# Patient Record
Sex: Female | Born: 1970 | Race: Black or African American | Hispanic: No | Marital: Married | Smoking: Never smoker
Health system: Southern US, Community
[De-identification: ages and names within clinical notes are randomized; demographics above are authoritative.]

## PROBLEM LIST (undated history)

## (undated) DIAGNOSIS — F329 Major depressive disorder, single episode, unspecified: Secondary | ICD-10-CM

## (undated) DIAGNOSIS — M1990 Unspecified osteoarthritis, unspecified site: Secondary | ICD-10-CM

## (undated) DIAGNOSIS — G8929 Other chronic pain: Secondary | ICD-10-CM

## (undated) DIAGNOSIS — K219 Gastro-esophageal reflux disease without esophagitis: Secondary | ICD-10-CM

## (undated) DIAGNOSIS — K648 Other hemorrhoids: Secondary | ICD-10-CM

## (undated) DIAGNOSIS — I10 Essential (primary) hypertension: Secondary | ICD-10-CM

## (undated) DIAGNOSIS — K449 Diaphragmatic hernia without obstruction or gangrene: Secondary | ICD-10-CM

## (undated) DIAGNOSIS — D214 Benign neoplasm of connective and other soft tissue of abdomen: Secondary | ICD-10-CM

## (undated) DIAGNOSIS — K224 Dyskinesia of esophagus: Secondary | ICD-10-CM

## (undated) DIAGNOSIS — F419 Anxiety disorder, unspecified: Secondary | ICD-10-CM

## (undated) DIAGNOSIS — Z9884 Bariatric surgery status: Secondary | ICD-10-CM

## (undated) DIAGNOSIS — I82409 Acute embolism and thrombosis of unspecified deep veins of unspecified lower extremity: Secondary | ICD-10-CM

## (undated) DIAGNOSIS — I2699 Other pulmonary embolism without acute cor pulmonale: Secondary | ICD-10-CM

## (undated) DIAGNOSIS — D649 Anemia, unspecified: Secondary | ICD-10-CM

## (undated) DIAGNOSIS — M25569 Pain in unspecified knee: Secondary | ICD-10-CM

## (undated) DIAGNOSIS — I209 Angina pectoris, unspecified: Secondary | ICD-10-CM

## (undated) DIAGNOSIS — Z87442 Personal history of urinary calculi: Secondary | ICD-10-CM

## (undated) DIAGNOSIS — F32A Depression, unspecified: Secondary | ICD-10-CM

## (undated) DIAGNOSIS — M549 Dorsalgia, unspecified: Secondary | ICD-10-CM

## (undated) HISTORY — DX: Diaphragmatic hernia without obstruction or gangrene: K44.9

## (undated) HISTORY — DX: Anxiety disorder, unspecified: F41.9

## (undated) HISTORY — DX: Bariatric surgery status: Z98.84

## (undated) HISTORY — DX: Depression, unspecified: F32.A

## (undated) HISTORY — DX: Morbid (severe) obesity due to excess calories: E66.01

## (undated) HISTORY — DX: Other hemorrhoids: K64.8

## (undated) HISTORY — DX: Essential (primary) hypertension: I10

## (undated) HISTORY — DX: Gastro-esophageal reflux disease without esophagitis: K21.9

## (undated) HISTORY — PX: ROUX-EN-Y GASTRIC BYPASS: SHX1104

## (undated) HISTORY — PX: ABDOMINAL HYSTERECTOMY: SHX81

## (undated) HISTORY — PX: KNEE ARTHROSCOPY: SUR90

## (undated) HISTORY — DX: Benign neoplasm of connective and other soft tissue of abdomen: D21.4

## (undated) HISTORY — PX: TUBAL LIGATION: SHX77

## (undated) HISTORY — DX: Dyskinesia of esophagus: K22.4

## (undated) HISTORY — PX: OTHER SURGICAL HISTORY: SHX169

## (undated) HISTORY — DX: Anemia, unspecified: D64.9

## (undated) HISTORY — DX: Major depressive disorder, single episode, unspecified: F32.9

---

## 1998-09-04 ENCOUNTER — Emergency Department (HOSPITAL_COMMUNITY): Admission: EM | Admit: 1998-09-04 | Discharge: 1998-09-05 | Payer: Self-pay | Admitting: Emergency Medicine

## 1998-10-08 ENCOUNTER — Emergency Department (HOSPITAL_COMMUNITY): Admission: EM | Admit: 1998-10-08 | Discharge: 1998-10-08 | Payer: Self-pay | Admitting: Emergency Medicine

## 1998-10-10 ENCOUNTER — Encounter: Admission: RE | Admit: 1998-10-10 | Discharge: 1998-11-06 | Payer: Self-pay | Admitting: *Deleted

## 1999-01-26 ENCOUNTER — Emergency Department (HOSPITAL_COMMUNITY): Admission: EM | Admit: 1999-01-26 | Discharge: 1999-01-26 | Payer: Self-pay | Admitting: Emergency Medicine

## 2000-01-19 ENCOUNTER — Emergency Department (HOSPITAL_COMMUNITY): Admission: EM | Admit: 2000-01-19 | Discharge: 2000-01-19 | Payer: Self-pay | Admitting: Emergency Medicine

## 2000-03-15 ENCOUNTER — Encounter: Payer: Self-pay | Admitting: Family Medicine

## 2000-03-15 ENCOUNTER — Ambulatory Visit (HOSPITAL_COMMUNITY): Admission: RE | Admit: 2000-03-15 | Discharge: 2000-03-15 | Payer: Self-pay | Admitting: Family Medicine

## 2000-04-25 ENCOUNTER — Encounter: Payer: Self-pay | Admitting: Emergency Medicine

## 2000-04-25 ENCOUNTER — Emergency Department (HOSPITAL_COMMUNITY): Admission: EM | Admit: 2000-04-25 | Discharge: 2000-04-25 | Payer: Self-pay | Admitting: Emergency Medicine

## 2000-05-19 ENCOUNTER — Encounter: Admission: RE | Admit: 2000-05-19 | Discharge: 2000-05-19 | Payer: Self-pay | Admitting: Internal Medicine

## 2001-01-12 ENCOUNTER — Emergency Department (HOSPITAL_COMMUNITY): Admission: EM | Admit: 2001-01-12 | Discharge: 2001-01-12 | Payer: Self-pay | Admitting: Emergency Medicine

## 2001-01-13 ENCOUNTER — Encounter: Payer: Self-pay | Admitting: Emergency Medicine

## 2001-01-13 ENCOUNTER — Ambulatory Visit (HOSPITAL_COMMUNITY): Admission: RE | Admit: 2001-01-13 | Discharge: 2001-01-13 | Payer: Self-pay | Admitting: Neurology

## 2001-01-13 ENCOUNTER — Encounter: Payer: Self-pay | Admitting: Neurology

## 2001-01-19 ENCOUNTER — Encounter: Payer: Self-pay | Admitting: Internal Medicine

## 2001-01-19 ENCOUNTER — Encounter: Admission: RE | Admit: 2001-01-19 | Discharge: 2001-01-19 | Payer: Self-pay | Admitting: Internal Medicine

## 2001-03-08 ENCOUNTER — Ambulatory Visit (HOSPITAL_COMMUNITY): Admission: RE | Admit: 2001-03-08 | Discharge: 2001-03-08 | Payer: Self-pay | Admitting: Neurosurgery

## 2001-03-08 ENCOUNTER — Encounter: Payer: Self-pay | Admitting: Neurosurgery

## 2001-08-15 ENCOUNTER — Encounter: Payer: Self-pay | Admitting: Internal Medicine

## 2001-08-15 ENCOUNTER — Encounter: Admission: RE | Admit: 2001-08-15 | Discharge: 2001-08-15 | Payer: Self-pay | Admitting: Internal Medicine

## 2001-09-12 ENCOUNTER — Encounter: Payer: Self-pay | Admitting: Neurosurgery

## 2001-09-12 ENCOUNTER — Ambulatory Visit (HOSPITAL_COMMUNITY): Admission: RE | Admit: 2001-09-12 | Discharge: 2001-09-12 | Payer: Self-pay | Admitting: Neurosurgery

## 2002-02-26 ENCOUNTER — Encounter: Payer: Self-pay | Admitting: Emergency Medicine

## 2002-02-26 ENCOUNTER — Emergency Department (HOSPITAL_COMMUNITY): Admission: EM | Admit: 2002-02-26 | Discharge: 2002-02-26 | Payer: Self-pay

## 2002-05-18 ENCOUNTER — Ambulatory Visit (HOSPITAL_COMMUNITY): Admission: RE | Admit: 2002-05-18 | Discharge: 2002-05-18 | Payer: Self-pay | Admitting: Gastroenterology

## 2002-05-18 ENCOUNTER — Encounter (INDEPENDENT_AMBULATORY_CARE_PROVIDER_SITE_OTHER): Payer: Self-pay | Admitting: *Deleted

## 2002-08-25 ENCOUNTER — Emergency Department (HOSPITAL_COMMUNITY): Admission: EM | Admit: 2002-08-25 | Discharge: 2002-08-25 | Payer: Self-pay | Admitting: Emergency Medicine

## 2002-08-25 ENCOUNTER — Encounter: Payer: Self-pay | Admitting: Emergency Medicine

## 2002-11-07 ENCOUNTER — Emergency Department (HOSPITAL_COMMUNITY): Admission: EM | Admit: 2002-11-07 | Discharge: 2002-11-07 | Payer: Self-pay | Admitting: Emergency Medicine

## 2002-11-07 ENCOUNTER — Encounter: Payer: Self-pay | Admitting: Emergency Medicine

## 2003-02-02 ENCOUNTER — Encounter: Payer: Self-pay | Admitting: Internal Medicine

## 2003-02-02 ENCOUNTER — Ambulatory Visit (HOSPITAL_COMMUNITY): Admission: RE | Admit: 2003-02-02 | Discharge: 2003-02-02 | Payer: Self-pay | Admitting: Internal Medicine

## 2003-07-26 ENCOUNTER — Encounter: Admission: RE | Admit: 2003-07-26 | Discharge: 2003-10-24 | Payer: Self-pay | Admitting: Orthopedic Surgery

## 2003-08-21 ENCOUNTER — Ambulatory Visit (HOSPITAL_COMMUNITY): Admission: RE | Admit: 2003-08-21 | Discharge: 2003-08-21 | Payer: Self-pay | Admitting: Obstetrics and Gynecology

## 2003-09-12 ENCOUNTER — Ambulatory Visit (HOSPITAL_COMMUNITY): Admission: RE | Admit: 2003-09-12 | Discharge: 2003-09-12 | Payer: Self-pay | Admitting: Orthopedic Surgery

## 2003-09-12 ENCOUNTER — Ambulatory Visit (HOSPITAL_BASED_OUTPATIENT_CLINIC_OR_DEPARTMENT_OTHER): Admission: RE | Admit: 2003-09-12 | Discharge: 2003-09-12 | Payer: Self-pay | Admitting: Orthopedic Surgery

## 2004-03-04 ENCOUNTER — Ambulatory Visit: Payer: Self-pay | Admitting: Nurse Practitioner

## 2004-03-18 ENCOUNTER — Ambulatory Visit (HOSPITAL_COMMUNITY): Admission: RE | Admit: 2004-03-18 | Discharge: 2004-03-18 | Payer: Self-pay | Admitting: Nurse Practitioner

## 2004-04-03 HISTORY — PX: TOTAL ABDOMINAL HYSTERECTOMY: SHX209

## 2004-04-08 ENCOUNTER — Ambulatory Visit: Payer: Self-pay | Admitting: Nurse Practitioner

## 2004-04-18 ENCOUNTER — Ambulatory Visit (HOSPITAL_COMMUNITY): Admission: RE | Admit: 2004-04-18 | Discharge: 2004-04-18 | Payer: Self-pay | Admitting: Nurse Practitioner

## 2004-04-23 ENCOUNTER — Emergency Department (HOSPITAL_COMMUNITY): Admission: EM | Admit: 2004-04-23 | Discharge: 2004-04-23 | Payer: Self-pay | Admitting: Family Medicine

## 2004-04-24 ENCOUNTER — Encounter (INDEPENDENT_AMBULATORY_CARE_PROVIDER_SITE_OTHER): Payer: Self-pay | Admitting: *Deleted

## 2004-04-24 ENCOUNTER — Inpatient Hospital Stay (HOSPITAL_COMMUNITY): Admission: RE | Admit: 2004-04-24 | Discharge: 2004-04-26 | Payer: Self-pay | Admitting: Obstetrics and Gynecology

## 2004-05-26 ENCOUNTER — Ambulatory Visit: Payer: Self-pay | Admitting: Nurse Practitioner

## 2004-06-19 ENCOUNTER — Ambulatory Visit: Payer: Self-pay | Admitting: Nurse Practitioner

## 2004-06-25 ENCOUNTER — Encounter: Admission: RE | Admit: 2004-06-25 | Discharge: 2004-07-29 | Payer: Self-pay | Admitting: Internal Medicine

## 2004-08-13 ENCOUNTER — Ambulatory Visit: Payer: Self-pay | Admitting: Nurse Practitioner

## 2004-09-01 ENCOUNTER — Encounter (INDEPENDENT_AMBULATORY_CARE_PROVIDER_SITE_OTHER): Payer: Self-pay | Admitting: *Deleted

## 2004-09-01 LAB — CONVERTED CEMR LAB

## 2004-09-10 ENCOUNTER — Encounter: Admission: RE | Admit: 2004-09-10 | Discharge: 2004-09-10 | Payer: Self-pay | Admitting: Family Medicine

## 2004-09-12 ENCOUNTER — Ambulatory Visit: Payer: Self-pay | Admitting: Family Medicine

## 2004-09-12 ENCOUNTER — Other Ambulatory Visit: Admission: RE | Admit: 2004-09-12 | Discharge: 2004-09-12 | Payer: Self-pay | Admitting: Family Medicine

## 2004-09-26 ENCOUNTER — Ambulatory Visit: Payer: Self-pay | Admitting: Family Medicine

## 2004-09-27 ENCOUNTER — Observation Stay (HOSPITAL_COMMUNITY): Admission: EM | Admit: 2004-09-27 | Discharge: 2004-09-27 | Payer: Self-pay | Admitting: *Deleted

## 2004-10-21 ENCOUNTER — Ambulatory Visit: Payer: Self-pay | Admitting: Family Medicine

## 2004-10-31 ENCOUNTER — Ambulatory Visit: Payer: Self-pay | Admitting: Family Medicine

## 2004-11-03 ENCOUNTER — Ambulatory Visit: Payer: Self-pay | Admitting: Family Medicine

## 2005-02-02 ENCOUNTER — Emergency Department (HOSPITAL_COMMUNITY): Admission: EM | Admit: 2005-02-02 | Discharge: 2005-02-02 | Payer: Self-pay | Admitting: Family Medicine

## 2005-03-23 ENCOUNTER — Ambulatory Visit: Payer: Self-pay | Admitting: Family Medicine

## 2005-07-14 ENCOUNTER — Ambulatory Visit: Payer: Self-pay | Admitting: Family Medicine

## 2005-07-22 ENCOUNTER — Emergency Department (HOSPITAL_COMMUNITY): Admission: EM | Admit: 2005-07-22 | Discharge: 2005-07-22 | Payer: Self-pay | Admitting: Emergency Medicine

## 2005-12-11 ENCOUNTER — Ambulatory Visit: Payer: Self-pay | Admitting: Family Medicine

## 2006-03-02 ENCOUNTER — Ambulatory Visit: Payer: Self-pay | Admitting: Family Medicine

## 2006-04-13 ENCOUNTER — Ambulatory Visit: Payer: Self-pay | Admitting: Sports Medicine

## 2006-05-11 ENCOUNTER — Ambulatory Visit: Payer: Self-pay | Admitting: Family Medicine

## 2006-05-13 ENCOUNTER — Ambulatory Visit: Payer: Self-pay | Admitting: Sports Medicine

## 2006-07-01 DIAGNOSIS — I10 Essential (primary) hypertension: Secondary | ICD-10-CM | POA: Insufficient documentation

## 2006-07-01 DIAGNOSIS — K219 Gastro-esophageal reflux disease without esophagitis: Secondary | ICD-10-CM | POA: Insufficient documentation

## 2006-07-01 DIAGNOSIS — J309 Allergic rhinitis, unspecified: Secondary | ICD-10-CM | POA: Insufficient documentation

## 2006-07-01 DIAGNOSIS — F339 Major depressive disorder, recurrent, unspecified: Secondary | ICD-10-CM | POA: Insufficient documentation

## 2006-07-02 ENCOUNTER — Encounter (INDEPENDENT_AMBULATORY_CARE_PROVIDER_SITE_OTHER): Payer: Self-pay | Admitting: *Deleted

## 2006-09-30 ENCOUNTER — Telehealth: Payer: Self-pay | Admitting: Family Medicine

## 2006-11-01 ENCOUNTER — Encounter: Payer: Self-pay | Admitting: Family Medicine

## 2006-11-01 ENCOUNTER — Ambulatory Visit: Payer: Self-pay | Admitting: Family Medicine

## 2007-01-11 ENCOUNTER — Ambulatory Visit: Payer: Self-pay | Admitting: Family Medicine

## 2007-01-11 ENCOUNTER — Telehealth (INDEPENDENT_AMBULATORY_CARE_PROVIDER_SITE_OTHER): Payer: Self-pay | Admitting: *Deleted

## 2007-01-13 ENCOUNTER — Telehealth: Payer: Self-pay | Admitting: *Deleted

## 2007-01-13 ENCOUNTER — Encounter: Payer: Self-pay | Admitting: Family Medicine

## 2007-03-08 ENCOUNTER — Ambulatory Visit: Payer: Self-pay | Admitting: Family Medicine

## 2007-03-25 ENCOUNTER — Encounter: Payer: Self-pay | Admitting: Family Medicine

## 2007-06-10 ENCOUNTER — Encounter: Payer: Self-pay | Admitting: Family Medicine

## 2007-07-05 ENCOUNTER — Encounter (INDEPENDENT_AMBULATORY_CARE_PROVIDER_SITE_OTHER): Payer: Self-pay | Admitting: *Deleted

## 2007-07-05 ENCOUNTER — Telehealth: Payer: Self-pay | Admitting: *Deleted

## 2007-07-05 ENCOUNTER — Ambulatory Visit: Payer: Self-pay | Admitting: Family Medicine

## 2007-07-05 ENCOUNTER — Telehealth (INDEPENDENT_AMBULATORY_CARE_PROVIDER_SITE_OTHER): Payer: Self-pay | Admitting: *Deleted

## 2007-07-05 DIAGNOSIS — IMO0002 Reserved for concepts with insufficient information to code with codable children: Secondary | ICD-10-CM | POA: Insufficient documentation

## 2007-07-05 DIAGNOSIS — M1710 Unilateral primary osteoarthritis, unspecified knee: Secondary | ICD-10-CM

## 2007-07-06 ENCOUNTER — Encounter: Payer: Self-pay | Admitting: Family Medicine

## 2007-07-06 ENCOUNTER — Encounter: Admission: RE | Admit: 2007-07-06 | Discharge: 2007-07-06 | Payer: Self-pay | Admitting: Family Medicine

## 2007-07-11 ENCOUNTER — Encounter: Payer: Self-pay | Admitting: Family Medicine

## 2007-09-09 ENCOUNTER — Ambulatory Visit: Payer: Self-pay | Admitting: Family Medicine

## 2007-09-12 ENCOUNTER — Telehealth: Payer: Self-pay | Admitting: *Deleted

## 2007-09-12 ENCOUNTER — Ambulatory Visit: Payer: Self-pay | Admitting: Family Medicine

## 2007-10-07 ENCOUNTER — Encounter: Payer: Self-pay | Admitting: Family Medicine

## 2007-10-07 ENCOUNTER — Ambulatory Visit: Payer: Self-pay | Admitting: Family Medicine

## 2007-10-07 LAB — CONVERTED CEMR LAB
ALT: 8 units/L (ref 0–35)
AST: 12 units/L (ref 0–37)
Albumin: 3.8 g/dL (ref 3.5–5.2)
Alkaline Phosphatase: 55 units/L (ref 39–117)
BUN: 10 mg/dL (ref 6–23)
CO2: 23 meq/L (ref 19–32)
Calcium: 9.4 mg/dL (ref 8.4–10.5)
Chloride: 104 meq/L (ref 96–112)
Cholesterol: 153 mg/dL (ref 0–200)
Creatinine, Ser: 0.7 mg/dL (ref 0.40–1.20)
Glucose, Bld: 78 mg/dL (ref 70–99)
HDL: 48 mg/dL (ref >39)
LDL Cholesterol: 92 mg/dL (ref 0–99)
Potassium: 4.5 meq/L (ref 3.5–5.3)
Sodium: 139 meq/L (ref 135–145)
Total Bilirubin: 0.4 mg/dL (ref 0.3–1.2)
Total CHOL/HDL Ratio: 3.2
Total Protein: 7.5 g/dL (ref 6.0–8.3)
Triglycerides: 67 mg/dL (ref <150)
VLDL: 13 mg/dL (ref 0–40)

## 2007-10-08 ENCOUNTER — Encounter: Admission: RE | Admit: 2007-10-08 | Discharge: 2007-10-08 | Payer: Self-pay | Admitting: Family Medicine

## 2007-10-25 ENCOUNTER — Encounter: Payer: Self-pay | Admitting: Family Medicine

## 2008-01-11 ENCOUNTER — Encounter (INDEPENDENT_AMBULATORY_CARE_PROVIDER_SITE_OTHER): Payer: Self-pay | Admitting: *Deleted

## 2008-02-06 ENCOUNTER — Telehealth: Payer: Self-pay | Admitting: Family Medicine

## 2008-04-10 ENCOUNTER — Telehealth: Payer: Self-pay | Admitting: *Deleted

## 2008-05-21 ENCOUNTER — Telehealth: Payer: Self-pay | Admitting: *Deleted

## 2008-05-21 ENCOUNTER — Ambulatory Visit: Payer: Self-pay | Admitting: Family Medicine

## 2008-06-07 ENCOUNTER — Telehealth: Payer: Self-pay | Admitting: Family Medicine

## 2008-07-10 ENCOUNTER — Encounter: Payer: Self-pay | Admitting: Family Medicine

## 2008-07-17 ENCOUNTER — Encounter: Admission: RE | Admit: 2008-07-17 | Discharge: 2008-07-17 | Payer: Self-pay | Admitting: Family Medicine

## 2008-08-28 ENCOUNTER — Encounter (INDEPENDENT_AMBULATORY_CARE_PROVIDER_SITE_OTHER): Payer: Self-pay | Admitting: Family Medicine

## 2008-08-28 ENCOUNTER — Ambulatory Visit: Payer: Self-pay | Admitting: Family Medicine

## 2008-08-28 LAB — CONVERTED CEMR LAB
BUN: 10 mg/dL (ref 6–23)
CO2: 24 meq/L (ref 19–32)
Calcium: 9.6 mg/dL (ref 8.4–10.5)
Chloride: 105 meq/L (ref 96–112)
Creatinine, Ser: 0.74 mg/dL (ref 0.40–1.20)
Glucose, Bld: 78 mg/dL (ref 70–99)
Potassium: 4.3 meq/L (ref 3.5–5.3)
Sodium: 140 meq/L (ref 135–145)

## 2008-08-29 ENCOUNTER — Encounter (INDEPENDENT_AMBULATORY_CARE_PROVIDER_SITE_OTHER): Payer: Self-pay | Admitting: Family Medicine

## 2008-08-30 ENCOUNTER — Encounter (INDEPENDENT_AMBULATORY_CARE_PROVIDER_SITE_OTHER): Payer: Self-pay | Admitting: Family Medicine

## 2008-09-11 ENCOUNTER — Ambulatory Visit: Payer: Self-pay | Admitting: Family Medicine

## 2008-09-13 ENCOUNTER — Ambulatory Visit: Payer: Self-pay | Admitting: Family Medicine

## 2008-09-14 ENCOUNTER — Encounter: Payer: Self-pay | Admitting: *Deleted

## 2008-09-25 ENCOUNTER — Ambulatory Visit: Payer: Self-pay | Admitting: Family Medicine

## 2008-10-07 ENCOUNTER — Encounter: Payer: Self-pay | Admitting: Family Medicine

## 2008-10-16 ENCOUNTER — Ambulatory Visit: Payer: Self-pay | Admitting: Family Medicine

## 2008-10-30 ENCOUNTER — Ambulatory Visit: Payer: Self-pay | Admitting: Family Medicine

## 2008-10-30 DIAGNOSIS — Z6841 Body Mass Index (BMI) 40.0 and over, adult: Secondary | ICD-10-CM | POA: Insufficient documentation

## 2008-12-04 ENCOUNTER — Ambulatory Visit: Payer: Self-pay | Admitting: Family Medicine

## 2009-02-01 ENCOUNTER — Ambulatory Visit: Payer: Self-pay | Admitting: Family Medicine

## 2009-02-01 ENCOUNTER — Encounter: Payer: Self-pay | Admitting: Family Medicine

## 2009-02-04 LAB — CONVERTED CEMR LAB
HCT: 38.6 % (ref 36.0–46.0)
Hemoglobin: 11.6 g/dL — ABNORMAL LOW (ref 12.0–15.0)
MCHC: 30.1 g/dL (ref 30.0–36.0)
MCV: 84.3 fL (ref 78.0–100.0)
Platelets: 467 10*3/uL — ABNORMAL HIGH (ref 150–400)
RBC: 4.58 M/uL (ref 3.87–5.11)
RDW: 15.2 % (ref 11.5–15.5)
TSH: 2.89 microintl units/mL (ref 0.350–4.500)
WBC: 12.8 10*3/uL — ABNORMAL HIGH (ref 4.0–10.5)

## 2009-02-23 ENCOUNTER — Emergency Department (HOSPITAL_COMMUNITY): Admission: EM | Admit: 2009-02-23 | Discharge: 2009-02-23 | Payer: Self-pay | Admitting: Family Medicine

## 2009-03-18 ENCOUNTER — Encounter: Payer: Self-pay | Admitting: Family Medicine

## 2009-03-18 ENCOUNTER — Ambulatory Visit: Payer: Self-pay | Admitting: Family Medicine

## 2009-03-18 ENCOUNTER — Telehealth: Payer: Self-pay | Admitting: Family Medicine

## 2009-03-20 ENCOUNTER — Telehealth: Payer: Self-pay | Admitting: Family Medicine

## 2009-07-15 ENCOUNTER — Telehealth: Payer: Self-pay | Admitting: Sports Medicine

## 2009-07-16 ENCOUNTER — Telehealth: Payer: Self-pay | Admitting: Family Medicine

## 2009-07-17 ENCOUNTER — Ambulatory Visit: Payer: Self-pay | Admitting: Family Medicine

## 2009-07-25 ENCOUNTER — Encounter: Payer: Self-pay | Admitting: Family Medicine

## 2009-08-10 ENCOUNTER — Telehealth: Payer: Self-pay | Admitting: Sports Medicine

## 2009-09-03 ENCOUNTER — Encounter: Admission: RE | Admit: 2009-09-03 | Discharge: 2009-09-03 | Payer: Self-pay | Admitting: Family Medicine

## 2009-09-04 ENCOUNTER — Encounter: Admission: RE | Admit: 2009-09-04 | Discharge: 2009-09-04 | Payer: Self-pay | Admitting: Obstetrics and Gynecology

## 2009-09-10 ENCOUNTER — Encounter: Admission: RE | Admit: 2009-09-10 | Discharge: 2009-09-10 | Payer: Self-pay | Admitting: Obstetrics and Gynecology

## 2009-09-11 ENCOUNTER — Encounter: Payer: Self-pay | Admitting: Family Medicine

## 2009-09-12 ENCOUNTER — Encounter: Payer: Self-pay | Admitting: Family Medicine

## 2009-09-12 ENCOUNTER — Other Ambulatory Visit: Admission: RE | Admit: 2009-09-12 | Discharge: 2009-09-12 | Payer: Self-pay | Admitting: Family Medicine

## 2009-09-12 ENCOUNTER — Ambulatory Visit: Payer: Self-pay | Admitting: Family Medicine

## 2009-09-12 DIAGNOSIS — G43909 Migraine, unspecified, not intractable, without status migrainosus: Secondary | ICD-10-CM | POA: Insufficient documentation

## 2009-09-12 LAB — CONVERTED CEMR LAB: Pap Smear: NEGATIVE

## 2009-09-13 ENCOUNTER — Encounter (INDEPENDENT_AMBULATORY_CARE_PROVIDER_SITE_OTHER): Payer: Self-pay | Admitting: *Deleted

## 2009-09-17 ENCOUNTER — Encounter: Payer: Self-pay | Admitting: Family Medicine

## 2009-09-17 LAB — CONVERTED CEMR LAB
BUN: 11 mg/dL (ref 6–23)
CO2: 21 meq/L (ref 19–32)
Calcium: 9.1 mg/dL (ref 8.4–10.5)
Chloride: 104 meq/L (ref 96–112)
Creatinine, Ser: 0.72 mg/dL (ref 0.40–1.20)
Glucose, Bld: 80 mg/dL (ref 70–99)
HCT: 37.7 % (ref 36.0–46.0)
Hemoglobin: 11.8 g/dL — ABNORMAL LOW (ref 12.0–15.0)
MCHC: 31.3 g/dL (ref 30.0–36.0)
MCV: 80 fL (ref 78.0–100.0)
Platelets: 518 10*3/uL — ABNORMAL HIGH (ref 150–400)
Potassium: 4.4 meq/L (ref 3.5–5.3)
RBC: 4.71 M/uL (ref 3.87–5.11)
RDW: 15.8 % — ABNORMAL HIGH (ref 11.5–15.5)
Sodium: 137 meq/L (ref 135–145)
WBC: 15.3 10*3/uL — ABNORMAL HIGH (ref 4.0–10.5)

## 2009-09-20 ENCOUNTER — Encounter: Payer: Self-pay | Admitting: Family Medicine

## 2009-09-20 ENCOUNTER — Ambulatory Visit: Payer: Self-pay | Admitting: Family Medicine

## 2009-09-20 LAB — CONVERTED CEMR LAB
Bilirubin Urine: NEGATIVE
Glucose, Urine, Semiquant: NEGATIVE
Ketones, urine, test strip: NEGATIVE
Nitrite: NEGATIVE
Protein, U semiquant: NEGATIVE
Specific Gravity, Urine: 1.02
Urobilinogen, UA: 1
WBC Urine, dipstick: NEGATIVE
Whiff Test: NEGATIVE
pH: 7

## 2009-09-23 ENCOUNTER — Ambulatory Visit: Payer: Self-pay | Admitting: Family Medicine

## 2009-09-23 LAB — CONVERTED CEMR LAB
Chlamydia, DNA Probe: NEGATIVE
GC Probe Amp, Genital: NEGATIVE
Hepatitis B Surface Ag: NEGATIVE

## 2009-10-07 ENCOUNTER — Encounter: Payer: Self-pay | Admitting: Family Medicine

## 2009-10-07 ENCOUNTER — Ambulatory Visit: Payer: Self-pay | Admitting: Family Medicine

## 2009-10-08 ENCOUNTER — Encounter: Payer: Self-pay | Admitting: Family Medicine

## 2009-10-08 LAB — CONVERTED CEMR LAB
Basophils Absolute: 0 10*3/uL (ref 0.0–0.1)
Basophils Relative: 0 % (ref 0–1)
Eosinophils Absolute: 0.1 10*3/uL (ref 0.0–0.7)
Eosinophils Relative: 1 % (ref 0–5)
HCT: 38.5 % (ref 36.0–46.0)
Hemoglobin: 11.4 g/dL — ABNORMAL LOW (ref 12.0–15.0)
Lymphocytes Relative: 25 % (ref 12–46)
Lymphs Abs: 3 10*3/uL (ref 0.7–4.0)
MCHC: 29.6 g/dL — ABNORMAL LOW (ref 30.0–36.0)
MCV: 83.9 fL (ref 78.0–100.0)
Monocytes Absolute: 0.7 10*3/uL (ref 0.1–1.0)
Monocytes Relative: 6 % (ref 3–12)
Neutro Abs: 8.1 10*3/uL — ABNORMAL HIGH (ref 1.7–7.7)
Neutrophils Relative %: 68 % (ref 43–77)
Platelets: 516 10*3/uL — ABNORMAL HIGH (ref 150–400)
RBC: 4.59 M/uL (ref 3.87–5.11)
RDW: 16.2 % — ABNORMAL HIGH (ref 11.5–15.5)
TSH: 1.937 microintl units/mL (ref 0.350–4.500)
WBC: 11.9 10*3/uL — ABNORMAL HIGH (ref 4.0–10.5)

## 2009-10-14 ENCOUNTER — Telehealth: Payer: Self-pay | Admitting: Family Medicine

## 2009-10-16 ENCOUNTER — Ambulatory Visit: Payer: Self-pay | Admitting: Gastroenterology

## 2009-10-16 LAB — CONVERTED CEMR LAB
Basophils Absolute: 0.1 10*3/uL (ref 0.0–0.1)
Basophils Relative: 0.6 % (ref 0.0–3.0)
Eosinophils Absolute: 0.1 10*3/uL (ref 0.0–0.7)
Eosinophils Relative: 0.4 % (ref 0.0–5.0)
Ferritin: 62.4 ng/mL (ref 10.0–291.0)
HCT: 36.9 % (ref 36.0–46.0)
Hemoglobin: 12.1 g/dL (ref 12.0–15.0)
Iron: 25 ug/dL — ABNORMAL LOW (ref 42–145)
Lymphocytes Relative: 20.5 % (ref 12.0–46.0)
Lymphs Abs: 3 10*3/uL (ref 0.7–4.0)
MCHC: 32.8 g/dL (ref 30.0–36.0)
MCV: 80.2 fL (ref 78.0–100.0)
Monocytes Absolute: 0.8 10*3/uL (ref 0.1–1.0)
Monocytes Relative: 5.8 % (ref 3.0–12.0)
Neutro Abs: 10.5 10*3/uL — ABNORMAL HIGH (ref 1.4–7.7)
Neutrophils Relative %: 72.7 % (ref 43.0–77.0)
Platelets: 460 10*3/uL — ABNORMAL HIGH (ref 150.0–400.0)
RBC: 4.59 M/uL (ref 3.87–5.11)
RDW: 16.1 % — ABNORMAL HIGH (ref 11.5–14.6)
Saturation Ratios: 6.5 % — ABNORMAL LOW (ref 20.0–50.0)
Transferrin: 275.4 mg/dL (ref 212.0–360.0)
WBC: 14.5 10*3/uL — ABNORMAL HIGH (ref 4.5–10.5)

## 2009-10-22 ENCOUNTER — Ambulatory Visit: Payer: Self-pay | Admitting: Gastroenterology

## 2009-10-22 LAB — CONVERTED CEMR LAB: Fecal Occult Bld: NEGATIVE

## 2009-10-26 ENCOUNTER — Telehealth: Payer: Self-pay | Admitting: Sports Medicine

## 2009-11-12 ENCOUNTER — Ambulatory Visit: Payer: Self-pay | Admitting: Gastroenterology

## 2009-11-12 ENCOUNTER — Ambulatory Visit (HOSPITAL_COMMUNITY): Admission: RE | Admit: 2009-11-12 | Discharge: 2009-11-12 | Payer: Self-pay | Admitting: Gastroenterology

## 2009-12-06 ENCOUNTER — Encounter: Admission: RE | Admit: 2009-12-06 | Discharge: 2009-12-06 | Payer: Self-pay | Admitting: Family Medicine

## 2009-12-06 ENCOUNTER — Ambulatory Visit (HOSPITAL_COMMUNITY): Admission: RE | Admit: 2009-12-06 | Discharge: 2009-12-06 | Payer: Self-pay | Admitting: Family Medicine

## 2009-12-06 ENCOUNTER — Telehealth (INDEPENDENT_AMBULATORY_CARE_PROVIDER_SITE_OTHER): Payer: Self-pay | Admitting: *Deleted

## 2009-12-06 ENCOUNTER — Encounter: Payer: Self-pay | Admitting: Family Medicine

## 2009-12-06 ENCOUNTER — Ambulatory Visit: Payer: Self-pay | Admitting: Family Medicine

## 2009-12-11 ENCOUNTER — Encounter: Payer: Self-pay | Admitting: Family Medicine

## 2009-12-11 ENCOUNTER — Ambulatory Visit: Payer: Self-pay | Admitting: Family Medicine

## 2009-12-12 ENCOUNTER — Encounter: Payer: Self-pay | Admitting: Family Medicine

## 2009-12-12 LAB — CONVERTED CEMR LAB
BUN: 9 mg/dL (ref 6–23)
CO2: 25 meq/L (ref 19–32)
Calcium: 9.2 mg/dL (ref 8.4–10.5)
Chloride: 103 meq/L (ref 96–112)
Cholesterol: 165 mg/dL (ref 0–200)
Creatinine, Ser: 0.72 mg/dL (ref 0.40–1.20)
Glucose, Bld: 99 mg/dL (ref 70–99)
HCT: 38.2 % (ref 36.0–46.0)
HDL: 53 mg/dL (ref >39)
Hemoglobin: 11.4 g/dL — ABNORMAL LOW (ref 12.0–15.0)
LDL Cholesterol: 95 mg/dL (ref 0–99)
MCHC: 29.8 g/dL — ABNORMAL LOW (ref 30.0–36.0)
MCV: 84.1 fL (ref 78.0–100.0)
Platelets: 501 10*3/uL — ABNORMAL HIGH (ref 150–400)
Potassium: 4.2 meq/L (ref 3.5–5.3)
RBC: 4.54 M/uL (ref 3.87–5.11)
RDW: 16.7 % — ABNORMAL HIGH (ref 11.5–15.5)
Sodium: 138 meq/L (ref 135–145)
Total CHOL/HDL Ratio: 3.1
Triglycerides: 87 mg/dL (ref <150)
VLDL: 17 mg/dL (ref 0–40)
WBC: 12.8 10*3/uL — ABNORMAL HIGH (ref 4.0–10.5)

## 2009-12-18 ENCOUNTER — Encounter: Payer: Self-pay | Admitting: Family Medicine

## 2009-12-18 ENCOUNTER — Ambulatory Visit (HOSPITAL_COMMUNITY): Admission: RE | Admit: 2009-12-18 | Discharge: 2009-12-18 | Payer: Self-pay | Admitting: Family Medicine

## 2009-12-18 ENCOUNTER — Ambulatory Visit: Payer: Self-pay | Admitting: Cardiovascular Disease

## 2009-12-20 ENCOUNTER — Telehealth: Payer: Self-pay | Admitting: Family Medicine

## 2010-02-05 ENCOUNTER — Emergency Department (HOSPITAL_COMMUNITY): Admission: EM | Admit: 2010-02-05 | Discharge: 2010-02-05 | Payer: Self-pay | Admitting: Emergency Medicine

## 2010-03-04 ENCOUNTER — Encounter: Admission: RE | Admit: 2010-03-04 | Discharge: 2010-03-04 | Payer: Self-pay | Admitting: Family Medicine

## 2010-03-06 ENCOUNTER — Encounter: Payer: Self-pay | Admitting: Family Medicine

## 2010-03-25 ENCOUNTER — Ambulatory Visit: Payer: Self-pay | Admitting: Family Medicine

## 2010-03-25 ENCOUNTER — Encounter: Payer: Self-pay | Admitting: Family Medicine

## 2010-03-26 ENCOUNTER — Telehealth: Payer: Self-pay | Admitting: *Deleted

## 2010-04-01 ENCOUNTER — Encounter: Admission: RE | Admit: 2010-04-01 | Discharge: 2010-04-01 | Payer: Self-pay | Admitting: Family Medicine

## 2010-04-07 ENCOUNTER — Encounter: Admission: RE | Admit: 2010-04-07 | Discharge: 2010-04-07 | Payer: Self-pay | Admitting: Family Medicine

## 2010-04-14 ENCOUNTER — Telehealth: Payer: Self-pay | Admitting: Family Medicine

## 2010-04-18 ENCOUNTER — Encounter
Admission: RE | Admit: 2010-04-18 | Discharge: 2010-06-03 | Payer: Self-pay | Source: Home / Self Care | Attending: Family Medicine | Admitting: Family Medicine

## 2010-05-24 ENCOUNTER — Encounter: Payer: Self-pay | Admitting: Internal Medicine

## 2010-06-05 NOTE — Assessment & Plan Note (Signed)
Summary: diahrrea,tcb   Vital Signs:  Patient profile:   40 year old female Weight:      381.8 pounds Temp:     98 degrees F oral Pulse rate:   79 / minute BP sitting:   120 / 77  (left arm) Cuff size:   large  Vitals Entered By: Loralee Pacas CMA (July 17, 2009 8:36 AM) CC: Diarrhea Comments diarrhea and vomiting since sunday   CC:  Diarrhea.  Acute Visit History:      The patient complains of abdominal pain, diarrhea, nausea, and vomiting.  These symptoms began 4 days ago.  She denies fever, genitourinary symptoms, headache, and musculoskeletal symptoms.  Other comments include: no sick contacts. tried immodium without relief. no longer vomiting. tolerating ginger ale, broth, water. abdominal ache  in epigastric region. no hematemesis/hematochezia, nonbilious emesis. .        Urine output has been normal.  There have been 5 diarrheal stools and 0 episodes of vomiting in the past 24 hours.  She is tolerating clear liquids.        The patient has no history of irritable bowel syndrome or family history for irritable bowel syndrome.        Current Medications (verified): 1)  Fioricet 50-325-40 Mg Tabs (Butalbital-Apap-Caffeine) .... Take 1 Tablet By Mouth Once A Day 2)  Lisinopril 20 Mg Tabs (Lisinopril) .... 1 Tab By Mouth Daily 3)  Omeprazole 40 Mg  Cpdr (Omeprazole) .... 1 Tab By Mouth Daily 4)  Gabapentin 400 Mg Caps (Gabapentin) .... One Tab By Mouth Tid 5)  Mobic 7.5 Mg Tabs (Meloxicam) .... One Daily (Per Ortho) 6)  Sertraline Hcl 100 Mg Tabs (Sertraline Hcl) .... Take 1 1/2 Tablet Daily 7)  Meclizine Hcl 25 Mg Chew (Meclizine Hcl) .... Take One By Mouth Three Times A Day As Needed Dizziness 8)  Allegra 180 Mg Tabs (Fexofenadine Hcl) .... 1 By Mouth Daily As Needed Allergy 9)  Topamax 25 Mg Tabs (Topiramate) .... 1 By Mouth At Bedtime X 1 Week, Then Increase To 2 Tabs By Mouth Daily 10)  Promethazine Hcl 25 Mg Tabs (Promethazine Hcl) .... One Tab By Mouth Q6 Hours As Needed  For Nausea/vomiting  Allergies (verified): 1)  ! Codeine  Physical Exam  General:  Vitals reviewed. Morbidly obese, NAD Mouth:  MMM Lungs:  Normal respiratory effort, chest expands symmetrically. Lungs are clear to auscultation, no crackles or wheezes. Heart:  Normal rate and regular rhythm. S1 and S2 normal without gallop, murmur, click, rub or other extra sounds. Abdomen:  morbidly obese, mild TTP in epigastrium. no rebound or guarding. normoactive BS   Impression & Recommendations:  Problem # 1:  GASTROENTERITIS WITHOUT DEHYDRATION (ICD-558.9) Assessment New  likely viral. tolerating fluids. given rx for phenergan. f/u as needed.   Her updated medication list for this problem includes:    Meclizine Hcl 25 Mg Chew (Meclizine hcl) ..... Take one by mouth three times a day as needed dizziness  Orders: [facility]- Est Level  3 (99213) Prescriptions: PROMETHAZINE HCL 25 MG TABS (PROMETHAZINE HCL) one tab by mouth q6 hours as needed for nausea/vomiting  #30 x 0   Entered and Authorized by:      MD   Signed by:      MD on 07/17/2009   Method used:   Electronically to        [facility] [id number] (retail)       [address]  [place], [place]  [address]       Ph: [phone]       Fax: [phone]   RxID:   [id number]

## 2010-06-05 NOTE — Assessment & Plan Note (Signed)
Summary: feeling dizzy,tcb   Vital Signs:  Patient profile:   40 year old female Height:      66.5 inches Weight:      382.25 pounds BMI:     60.99 Temp:     97.2 degrees F oral Pulse rate:   74 / minute Pulse (ortho):   66 / minute Pulse rhythm:   regular BP sitting:   120 / 72  (right arm) BP standing:   118 / 70  Vitals Entered By: Modesta Messing LPN (February 01, 2009 9:39 AM)  Serial Vital Signs/Assessments:  Time      Position  BP       Pulse  Resp  Temp     By 10:05 AM  Lying LA  114/70   64                    Thekla Slade CMA, 10:05 AM  Sitting   116/70                         Arlyss Repress CMA, 10:05 AM  Standing  118/70   66                    Thekla Slade CMA,  CC: c/o feeling lightheaded for two weeks.  SOB with ambulation. Is Patient Diabetic? No Pain Assessment Patient in pain? no        Primary Care Jermany Sundell:  Milinda Antis MD  CC:  c/o feeling lightheaded for two weeks.  SOB with ambulation.Crystal Garza  History of Present Illness: CC: dizzy  40 yo morbidly obese AAF with h/o HTN presents with  ~2wk h/o dizziness characterized as lightheaded and off balance.  Worse in AM, laying down seems to improve although at times lightheaded even laying down.  Thinks may be worse if standing up quickly or rapid movements of head.  + congestion and HA, but chronic headahces and no change in character recently.  Denies vertigo, dysequilibrium, ear pain, tinnitis, decreased hearing, fevers, chills, sick contacts, recent illness, naseua, vomiting, diarrhea, ST, LOC.  Not more fatigued recently.  + constipation over last week and feels hair falling out in front.  No temperature instability or weight changes.  Only change in meds was she's been out of neurontin for last week, and she supposed to take 900mg  nightly per ortho.  Also has been taking fioricet daily for last week.  Habits & Providers  Alcohol-Tobacco-Diet     Tobacco Status: never  Current Medications (verified): 1)   Fioricet 50-325-40 Mg Tabs (Butalbital-Apap-Caffeine) .... Take 1 Tablet By Mouth Once A Day 2)  Zyrtec Hives Relief 10 Mg Tabs (Cetirizine Hcl) .... Take 1 Tablet By Mouth Once A Day 3)  Lisinopril 20 Mg Tabs (Lisinopril) .Crystal Garza.. 1 Tab By Mouth Daily 4)  Omeprazole 40 Mg  Cpdr (Omeprazole) .Crystal Garza.. 1 Tab By Mouth Daily 5)  Neurontin 300 Mg Caps (Gabapentin) .... Three At Bedtime (Dr Jorge Mandril) 6)  Mobic 7.5 Mg Tabs (Meloxicam) .... One Daily (Per Ortho) 7)  Sertraline Hcl 100 Mg Tabs (Sertraline Hcl) .... Take 1 1/2 Tablet Daily 8)  Meclizine Hcl 25 Mg Chew (Meclizine Hcl) .... Take One By Mouth Three Times A Day As Needed Dizziness  Allergies (verified): 1)  ! Codeine  Past History:  Past medical, surgical, family and social histories (including risk factors) reviewed, and no changes noted (except as noted below).  Past Medical History: Reviewed history from  11/01/2006 and no changes required. 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 carpel tunnel release R - 09/02/2003 CT chest neg for PE - 09/30/2004 L knee arthroscopy x 3 - 05/04/2002 LE Doppler venous neg for DVT - 09/30/2004 TAH (without oophorectomy) - 04/03/2004  Family History: Reviewed history from 11/01/2006 and no changes required. Brother 76- healthy Father- Loleta Books) died age 25 w/ brain CA, had Heart dz, DM, colon CA, Mother healthy, Sister- 58 healthy  Social History: Reviewed history from 11/01/2006 and no changes required. Lives w/ Fiance and 3 kids- Almonzo, [address] and [employer].  Having problems with [employer], suspended from school, possibly bipolar.  Non-smoker. Denies ETOH.  Finished H.S.  On disability for knee pain (patellar dislocation) and depression.  Trying to lose wt.  Physical Exam  General:  Vitals reviewed. Morbidly obese, NAD Head:  Normocephalic and atraumatic without obvious abnormalities. Eyes:   EOMI. Perrla. Ears:  External ear exam shows no significant lesions or deformities.  Otoscopic examination reveals clear canals, tympanic membranes are intact bilaterally without bulging, retraction, inflammation or discharge. Hearing is grossly normal bilaterally. Nose:  External nasal examination shows no deformity or inflammation. Nasal mucosa are pink and moist without lesions or exudates. Mouth:  Oral mucosa and oropharynx without lesions or exudates.  Dentures. Neck:  No deformities, masses, or tenderness noted. Lungs:   Lungs are clear to auscultation, no crackles or wheezes. Heart:  Normal rate and regular rhythm. S1 and S2 normal without  murmur Pulses:  Pulses 2+ Extremities:  no edema Neurologic:  No cranial nerve deficits noted. Station and gait are normal. DTRs are symmetrical throughout. Sensory, motor and coordinative functions appear intact.  negative romberg, pronator drift.  No dysdiadokokinesia.  No nystagmus, negative dix hallpike.  Gait intact.   Impression & Recommendations:  Problem # 1:  DIZZINESS (ICD-780.4) Assessment New Unsure etiology.  Check CBC, TSH.  Doesn't sound like vertigo, ? labrynthitis.  + lightheadedness but no presyncope.  Trial of meclizine.  Pt to call if no improvement in 10 days, sooner if worsen.  orthostatics normal (although slightly low with sbp of 110s) HR remained in 60s, no beta blockade.  Common side effect of neurontin is dizziness, but doubt discontinuing would cause this as well.  Her updated medication list for this problem includes:    Zyrtec Hives Relief 10 Mg Tabs (Cetirizine hcl) .Crystal Garza... Take 1 tablet by mouth once a day    Meclizine Hcl 25 Mg Chew (Meclizine hcl) .Crystal Garza... Take one by mouth three times a day as needed dizziness  Orders: CBC-[facility] (04540) TSH-[facility] ([address]81[address]) [facility]- Est  Level 4 (95621)  Problem # 2:  MORBID OBESITY (ICD-278.01)  if dizziness continues, consider [facility] pulse oximetry (cheap) overnight to r/o OSA or other  OHVS.  If abnormal, consider sleep study.  Orders: [facility]- Est  Level 4 ([address]65)  Complete Medication List: 1)  Fioricet 50-325-40 Mg Tabs (Butalbital-apap-caffeine) .... Take 1 tablet by mouth once a day 2)  Zyrtec Hives Relief 10 Mg Tabs (Cetirizine hcl) .... Take 1 tablet by mouth once a day 3)  Lisinopril 20 Mg Tabs (Lisinopril) .Crystal Garza.. 1 tab by mouth daily 4)  Omeprazole 40 Mg Cpdr (Omeprazole) .Crystal Garza.. 1 tab by mouth daily 5)  Neurontin 300 Mg Caps (Gabapentin) .... Three at bedtime (dr Jorge Mandril) 6)  Mobic 7.5 Mg Tabs (Meloxicam) .... One daily (per ortho) 7)  Sertraline Hcl 100 Mg Tabs (Sertraline  hcl) .... Take 1 1/2 tablet daily 8)  Meclizine Hcl 25 Mg Chew (Meclizine hcl) .... Take one by mouth three times a day as needed dizziness  Patient Instructions: 1)  Please return for follow up in the next 2 weeks, sooner if worsening. 2)  I'm not sure what is causing your dizziness, but expect it to get better over the next few days. 3)  Try the medicine prescribed for dizziness. 4)  Your exam today is normal.  Your blood pressure measurements were also fine today.   5)  We have drawn blood work and will call you with results or send a letter. Prescriptions: MECLIZINE HCL 25 MG CHEW (MECLIZINE HCL) take one by mouth three times a day as needed dizziness  #15 x 0   Entered and Authorized by:   Eustaquio Boyden  MD   Signed by:   Eustaquio Boyden  MD on 02/01/2009   Method used:   Electronically to        [facility] [id number] (retail)       [address] [place], [place]  [address]       Ph: [phone]       Fax: [phone]   RxID:   [id number]

## 2010-06-05 NOTE — Progress Notes (Signed)
Summary: Triage:  Chest pain  Phone Note Call from Patient Call back at [phone]   Summary of Call: is c/o chest pain that is worse when she lifts her arms or turns head, wants to be seen here Initial call taken by: Haydee Salter,  February 06, 2008 2:30 PM  Follow-up for Phone Call        Pt states she is having chest pain on the left side of her chest that is worse when she raises arm or turns her head.  States it started this morning without cause, and has been unrelieved since then.  Discussed with Dr. Mauricio Po - given pt's risk factors - pt was advised to ED to be evaluated.  Pt hesitant, but states she will go, and has someone to drive her.  Follow-up by: AMY MARTIN RN,  February 06, 2008 2:52 PM

## 2010-06-05 NOTE — Assessment & Plan Note (Signed)
Summary: READ TB/EO  Nurse Visit     Allergies: 1)  ! Codeine    PPD Results    Date of reading: 09/13/2008    Results: < 5mm    Interpretation: negative   Orders Added: 1)  No Charge Patient Arrived (NCPA0) [NCPA0]

## 2010-06-05 NOTE — Progress Notes (Signed)
Summary: WI request   Phone Note Call from Patient   Summary of Call: Pt states she is experiencing lower back and left leg pain.  She would like to be seen today.  Pt is coming in at 10:30am this morning. Initial call taken by: Haydee Salter,  January 11, 2007 8:38 AM

## 2010-06-05 NOTE — Assessment & Plan Note (Signed)
Summary: shortness of breath/ls   Vital Signs:  Patient profile:   40 year old female Height:      66.5 inches Weight:      404 pounds BMI:     64.46 Temp:     98.3 degrees F oral Pulse rate:   90 / minute BP sitting:   143 / 81  (left arm) Cuff size:   large  Vitals Entered By: Jimmy Footman, CMA (December 06, 2009 2:59 PM) CC: SOB   Primary Care Provider:  Milinda Antis MD  CC:  SOB.  History of Present Illness: Dyspnea today.  On exertion - seems much worse than her baseline.  Denies chest pain.  No hx of DVT, CAD.  No fever or cough.  Denies trauma, recent surg, or unilateral leg swelling.  No palpitations  Habits & Providers  Alcohol-Tobacco-Diet     Tobacco Status: never  Current Medications (verified): 1)  Fioricet 50-325-40 Mg Tabs (Butalbital-Apap-Caffeine) .... Take 1 Tablet By Mouth Once A Day 2)  Lisinopril 20 Mg Tabs (Lisinopril) .Marland Kitchen.. 1 Tab By Mouth Daily 3)  Omeprazole 40 Mg  Cpdr (Omeprazole) .Marland Kitchen.. 1 Tab By Mouth Daily 4)  Gabapentin 400 Mg Caps (Gabapentin) .... One Tab By Mouth Tid 5)  Sertraline Hcl 100 Mg Tabs (Sertraline Hcl) .... Take 1 1/2 Tablet Daily 6)  Allegra 180 Mg Tabs (Fexofenadine Hcl) .Marland Kitchen.. 1 By Mouth Daily As Needed Allergy 7)  Promethazine Hcl 25 Mg Tabs (Promethazine Hcl) .... One Tab By Mouth Q6 Hours As Needed For Nausea/vomiting 8)  Nortriptyline Hcl 25 Mg Caps (Nortriptyline Hcl) .Marland Kitchen.. 1 By Mouth At Bedtime For Migraines 9)  Imitrex 50 Mg Tabs (Sumatriptan Succinate) .Marland Kitchen.. 1 By Mouth At Onset of Headache May Repeat in 2 Hours 10)  Alprazolam 0.5 Mg Tabs (Alprazolam) .Marland Kitchen.. 1 By Mouth Two Times A Day As Needed Anxiety 11)  Hydrochlorothiazide 25 Mg  Tabs (Hydrochlorothiazide) .... Take 1 Tab By Mouth Every Morning  Allergies (verified): 1)  ! Codeine  Past History:  Past medical, surgical, family and social histories (including risk factors) reviewed, and no changes noted (except as noted below).  Past Medical History: Reviewed history  from 10/16/2009 and no changes required. `brain cyst` patellofemoral DJD supartz injection left knee Anxiety Disorder Arthritis Depression GERD Hypertension Kidney Stones Obesity  Past Surgical History: Reviewed history from 10/16/2009 and no changes required. carpel tunnel release R - 09/02/2003 CT chest neg for PE - 09/30/2004 L knee arthroscopy x 3 - 05/04/2002 LE Doppler venous neg for DVT - 09/30/2004 TAH (without oophorectomy) supracervical  - 04/03/2004 c section Hysterectomy partial  Family History: Reviewed history from 10/16/2009 and no changes required. Brother 48- healthy Father- Loleta Books) died age 35 w/ brain CA, had Heart dz, DM, colon CA Age 79  Mother healthy Sister- 79 healthy Family History of Colon Cancer: dather dxed age 61 deceased colon cancer mets to brain  Social History: Reviewed history from 10/16/2009 and no changes required. Lives w/ Fiance and 3 kids- Almonzo, [address] and [employer].    Non-smoker. Denies ETOH.   Finished H.S.   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  VS and pulse ox noted Mouth:  Oral mucosa and oropharynx without lesions or exudates.  Teeth in good repair. Neck:  No deformities, masses, or tenderness noted. Lungs:  Normal respiratory effort, chest expands symmetrically.  Lungs are clear to auscultation, no crackles or wheezes. Heart:  Normal rate and regular rhythm. S1 and S2 normal without gallop, murmur, click, rub or other extra sounds. Extremities:  2+ symmetric leg edema   Impression & Recommendations:  Problem # 1:  DYSPNEA (ICD-786.09) Given weight and conditioning, feel she has many reasons.  Only puzzle is why accutely?  Will add HCTZ given edema on exam.  EKG done in office no acute changes.  Get CXR re concern over CHF Her updated medication  list for this problem includes:    Lisinopril 20 Mg Tabs (Lisinopril) .Marland Kitchen... 1 tab by mouth daily    Hydrochlorothiazide 25 Mg Tabs (Hydrochlorothiazide) .Marland Kitchen... Take 1 tab by mouth every morning  Orders: 12 Lead EKG (12 Lead EKG) CXR- 2view (CXR) [facility]- Est  Level 4 ([address])  Complete Medication List: 1)  Fioricet 50-325-40 Mg Tabs (Butalbital-apap-caffeine) .... Take 1 tablet by mouth once a day 2)  Lisinopril 20 Mg Tabs (Lisinopril) .Marland Kitchen.. 1 tab by mouth daily 3)  Omeprazole 40 Mg Cpdr (Omeprazole) .Marland Kitchen.. 1 tab by mouth daily 4)  Gabapentin 400 Mg Caps (Gabapentin) .... One tab by mouth tid 5)  Sertraline Hcl 100 Mg Tabs (Sertraline hcl) .... Take 1 1/2 tablet daily 6)  Allegra 180 Mg Tabs (Fexofenadine hcl) .Marland Kitchen.. 1 by mouth daily as needed allergy 7)  Promethazine Hcl 25 Mg Tabs (Promethazine hcl) .... One tab by mouth q6 hours as needed for nausea/vomiting 8)  Nortriptyline Hcl 25 Mg Caps (Nortriptyline hcl) .Marland Kitchen.. 1 by mouth at bedtime for migraines 9)  Imitrex 50 Mg Tabs (Sumatriptan succinate) .Marland Kitchen.. 1 by mouth at onset of headache may repeat in 2 hours 10)  Alprazolam 0.5 Mg Tabs (Alprazolam) .Marland Kitchen.. 1 by mouth two times a day as needed anxiety 11)  Hydrochlorothiazide 25 Mg Tabs (Hydrochlorothiazide) .... Take 1 tab by mouth every morning Prescriptions: HYDROCHLOROTHIAZIDE 25 MG  TABS (HYDROCHLOROTHIAZIDE) Take 1 tab by mouth every morning  #90 x 3   Entered and Authorized by:   Doralee Albino MD   Signed by:   Doralee Albino MD on 12/06/2009   Method used:   Electronically to        [facility] [id number] (retail)       [address] [place], [place]  [address]       Ph: [phone]       Fax: [phone]   RxID:   [id number]

## 2010-06-05 NOTE — Progress Notes (Signed)
Summary: re: pain meds/ts  Phone Note Outgoing Call   Call placed by: Milinda Antis MD,  March 26, 2010 8:49 AM Details for Reason: LVM Summary of Call: Left  message for patient to continue her current meds and Dr. Jorge Mandril will see pt in 6 weeks if not improved MRI will be obtained I did note Dr. Jorge Mandril note states Lortab given and then I gave Vicodin, pt did not mention this, she can not take both meds if she has both

## 2010-06-05 NOTE — Assessment & Plan Note (Signed)
Summary: Crystal Garza,Crystal Garza   Vital Signs:  Patient profile:   40 year old female Height:      66.5 inches Weight:      396.8 pounds BMI:     63.31 Temp:     97.9 degrees F oral Pulse rate:   93 / minute BP sitting:   135 / 84  (right arm) Cuff size:   large  Vitals Entered By: Tessie Fass CMA (December 11, 2009 9:24 AM) CC: f/u dyspnea, Migraine, labs Is Patient Diabetic? No Pain Assessment Patient in pain? yes     Location: headache   Primary Care Ata Pecha:  Crystal Antis MD  CC:  f/u dyspnea, Migraine, and labs.  History of Present Illness:   SOB- pt seen last friday with SOB/ bilateral leg swelling  of unclear etiology,EKG, CXR wnl and exam with exeception of leg swelling. 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 Pt was started on HCTZ for the leg swelling and weight is down 4 lbs today.  Since Friday, SOB has improved, she was able to work the past two days without any concerns.  Migraine- s/p events of Friday, pt had a migraine which lasted for 2 days, she tried Imitrex twice but had N/V , per report she called the on call doctor but there is no record of instructions, she did not try her fiorcet for the headache. States the Imitrex gave her chest pressure, which it has caused in the past.  Headache now more bearable,declines in office treatment  Obesity- with dicussion of SOB, pt asked about her weight gain and difficulities, she continues to try a lap band diet that her friend has used prior to her surgery, has not been able to exercise much because of severe back and knee pain at times. Tries to watch her intake per report  Habits & Providers  Alcohol-Tobacco-Diet     Tobacco Status: never  Current Medications (verified): 1)  Fioricet 50-325-40 Mg Tabs (Butalbital-Apap-Caffeine) .... Take 1 Tablet By Mouth Once A Day 2)  Lisinopril 20 Mg Tabs (Lisinopril) .Marland Kitchen.. 1 Tab By Mouth Daily 3)  Omeprazole 40 Mg  Cpdr  (Omeprazole) .Marland Kitchen.. 1 Tab By Mouth Daily 4)  Gabapentin 400 Mg Caps (Gabapentin) .... One Tab By Mouth Tid 5)  Sertraline Hcl 100 Mg Tabs (Sertraline Hcl) .... Take 1 1/2 Tablet Daily 6)  Allegra 180 Mg Tabs (Fexofenadine Hcl) .Marland Kitchen.. 1 By Mouth Daily As Needed Allergy 7)  Promethazine Hcl 25 Mg Tabs (Promethazine Hcl) .... One Tab By Mouth Q6 Hours As Needed For Nausea/vomiting 8)  Nortriptyline Hcl 25 Mg Caps (Nortriptyline Hcl) .Marland Kitchen.. 1 By Mouth At Bedtime For Migraines 9)  Alprazolam 0.5 Mg Tabs (Alprazolam) .Marland Kitchen.. 1 By Mouth Two Times A Day As Needed Anxiety 10)  Hydrochlorothiazide 25 Mg  Tabs (Hydrochlorothiazide) .... Take 1 Tab By Mouth Every Morning 11)  Ferrous Sulfate 325 (65 Fe) Mg Tabs (Ferrous Sulfate) .Marland Kitchen.. 1 By Mouth Two Times A Day For Anemia  Allergies (verified): 1)  ! Codeine  Family History: Reviewed history from 10/16/2009 and no changes required. Brother 92- healthy Father- Loleta Books) died age 45 w/ brain CA, had Heart dz, DM, colon CA Age 55  Mother healthy Sister- 66 healthy Family History of Colon Cancer: dather dxed age 83 deceased colon cancer mets to brain  Review of Systems       ROS- no chest pain, no SOB, no cough, no fever, improved leg  swelling +weight loss  Physical Exam  General:  NAD, Vital signs noted obese, pleasant Lungs:  Normal respiratory effort, chest expands symmetrically. Lungs are clear to auscultation, no crackles or wheezes. Heart:  Normal rate and regular rhythm. S1 and S2 normal without gallop, murmur,  Pulses:  Pulses 2+ Extremities:  trace leg edema   Impression & Recommendations:  Problem # 1:  DYSPNEA (ICD-786.09) Assessment Improved  Improved without minimal intervention besides HCTZ for leg edema. No CAD or CHF history in patient however, family history of CAD in father. Will send for ECHO with leg swelling and symptoms Check Hb history of iron def Her updated medication list for this problem includes:    Lisinopril 20 Mg  Tabs (Lisinopril) .Marland Kitchen... 1 tab by mouth daily    Hydrochlorothiazide 25 Mg Tabs (Hydrochlorothiazide) .Marland Kitchen... Take 1 tab by mouth every morning  Orders: 2 D Echo (2 D Echo) [facility]- Est  Level 4 ([address])  Problem # 2:  MIGRAINE, CHRONIC (ICD-346.90) Assessment: Unchanged  d/c imitrex secondary to SE The following medications were removed from the medication list:    Imitrex 50 Mg Tabs (Sumatriptan succinate) .Marland Kitchen... 1 by mouth at onset of headache may repeat in 2 hours Her updated medication list for this problem includes:    Fioricet 50-325-40 Mg Tabs (Butalbital-apap-caffeine) .Marland Kitchen... Take 1 tablet by mouth once a day  Orders: [facility]- Est  Level 4 ([address])  Problem # 3:  EDEMA LEG (ICD-782.3) Assessment: Improved  Her updated medication list for this problem includes:    Hydrochlorothiazide 25 Mg Tabs (Hydrochlorothiazide) .Marland Kitchen... Take 1 tab by mouth every morning  Orders: 2 D Echo (2 D Echo) [facility]- Est  Level 4 ([address])  Problem # 4:  HYPERTENSION, BENIGN SYSTEMIC (ICD-401.1) Assessment: Improved  Her updated medication list for this problem includes:    Lisinopril 20 Mg Tabs (Lisinopril) .Marland Kitchen... 1 tab by mouth daily    Hydrochlorothiazide 25 Mg Tabs (Hydrochlorothiazide) .Marland Kitchen... Take 1 tab by mouth every morning  Orders: Basic Met-[facility] ([address]) CBC-[facility] ([address]) Lipid-[facility] ([address]) [facility]- Est  Level 4 ([address])  Problem # 5:  THROMBOCYTOSIS (ICD-289.9) Assessment: Unchanged recheck labs, expect improvement with iron replacement, reactive per smear  Complete Medication List: 1)  Fioricet 50-325-40 Mg Tabs (Butalbital-apap-caffeine) .... Take 1 tablet by mouth once a day 2)  Lisinopril 20 Mg Tabs (Lisinopril) .Marland Kitchen.. 1 tab by mouth daily 3)  Omeprazole 40 Mg Cpdr (Omeprazole) .Marland Kitchen.. 1 tab by mouth daily 4)  Gabapentin 400 Mg Caps (Gabapentin) .... One tab by mouth tid 5)  Sertraline Hcl 100 Mg Tabs (Sertraline hcl) .... Take 1 1/2 tablet daily 6)  Allegra 180 Mg Tabs (Fexofenadine hcl) .Marland Kitchen..  1 by mouth daily as needed allergy 7)  Promethazine Hcl 25 Mg Tabs (Promethazine hcl) .... One tab by mouth q6 hours as needed for nausea/vomiting 8)  Nortriptyline Hcl 25 Mg Caps (Nortriptyline hcl) .Marland Kitchen.. 1 by mouth at bedtime for migraines 9)  Alprazolam 0.5 Mg Tabs (Alprazolam) .Marland Kitchen.. 1 by mouth two times a day as needed anxiety 10)  Hydrochlorothiazide 25 Mg Tabs (Hydrochlorothiazide) .... Take 1 tab by mouth every morning 11)  Ferrous Sulfate 325 (65 Fe) Mg Tabs (Ferrous sulfate) .Marland Kitchen.. 1 by mouth two times a day for anemia  Patient Instructions: 1)  Follow-up in 3 months 2)  I will call you with your lab results from today 3)  I will check your heart function with an Echo 4)  Return if you have continued SOB or chest pain  or go to the nearest ER 5)  Please restart your iron tablets, also increasing green leafy vegetables will help 6)  For your weight, make an appt with Danie Chandler to help the diet component  Prescriptions: ALPRAZOLAM 0.5 MG TABS (ALPRAZOLAM) 1 by mouth two times a day as needed anxiety  #40 x 0   Entered and Authorized by:   Crystal Antis MD   Signed by:   Crystal Antis MD on 12/11/2009   Method used:   Telephoned to ...       [id number] (retail)       [address]       [place], [place]  [address]       Ph: [phone]       Fax: [phone]   RxID:   [id number] NORTRIPTYLINE HCL 25 MG CAPS (NORTRIPTYLINE HCL) 1 by mouth at bedtime for migraines  #30 x 5   Entered and Authorized by:   Crystal Antis MD   Signed by:   Crystal Antis MD on 12/11/2009   Method used:   Electronically to        [facility] [address] (retail)       [address]       [place], [place]  [address]       Ph: [phone]       Fax: [phone]   RxID:   [id number] PROMETHAZINE HCL 25 MG TABS (PROMETHAZINE HCL) one tab by mouth q6 hours as needed for nausea/vomiting  #30 x 1   Entered and Authorized by:    Crystal Antis MD   Signed by:   Crystal Antis MD on 12/11/2009   Method used:   Electronically to        [facility] [address] (retail)       [address]       [place], [place]  [address]       Ph: [phone]       Fax: [phone]   RxID:   [id number] GABAPENTIN 400 MG CAPS (GABAPENTIN) one tab by mouth tid  #90 x 6   Entered and Authorized by:   Crystal Antis MD   Signed by:   Crystal Antis MD on 12/11/2009   Method used:   Electronically to        [facility] [address] (retail)       [address]       [place], [place]  [address]       Ph: [phone]       Fax: [phone]   RxID:   [id number] OMEPRAZOLE 40 MG  CPDR (OMEPRAZOLE) 1 tab by mouth daily  #30 x 6   Entered and Authorized by:   Crystal Antis MD   Signed by:   Crystal Antis MD on 12/11/2009   Method used:   Electronically to        [facility] [address] (retail)       [address] [place], [place]  [address]       Ph: [phone]       Fax: [phone]   RxID:   [id number] FIORICET 50-325-40 MG TABS (  BUTALBITAL-APAP-CAFFEINE) Take 1 tablet by mouth once a day  #60 Tablet x 1   Entered and Authorized by:   Crystal Antis MD   Signed by:   Crystal Antis MD on 12/11/2009   Method used:   Electronically to        [facility] [address] (retail)       [address] [place], [place]  [address]       Ph: [phone]       Fax: [phone]   RxID:   [id number] FERROUS SULFATE 325 (65 FE) MG TABS (FERROUS SULFATE) 1 by mouth two times a day for anemia  #60 x 6   Entered and Authorized by:   Crystal Antis MD   Signed by:   Crystal Antis MD on 12/11/2009   Method used:   Electronically to        [facility] [address] (retail)       [address]       [place], [place]  [address]       Ph: [phone]       Fax: [phone]   RxID:    [id number]    Prevention & Chronic Care Immunizations   Influenza vaccine: Fluvax MCR  (03/18/2009)    Tetanus booster: Not documented    Pneumococcal vaccine: Not documented  Other Screening   Pap smear: NEGATIVE FOR INTRAEPITHELIAL LESIONS OR MALIGNANCY.  (09/12/2009)    Mammogram: BI-RADS CATEGORY 3:  Probably benign finding(s) - short interval^MM DIGITAL DIAG LTD R  (09/10/2009)   Smoking status: never  (12/11/2009)  Lipids   Total Cholesterol: 153  (10/07/2007)   LDL: 92  (10/07/2007)   LDL Direct: Not documented   HDL: 48  (10/07/2007)   Triglycerides: 67  (10/07/2007)  Hypertension   Last Blood Pressure: 135 / 84  (12/11/2009)   Serum creatinine: 0.72  (09/12/2009)   Serum potassium 4.4  (09/12/2009)    Hypertension flowsheet reviewed?: Yes   Progress toward BP goal: At goal  Self-Management Support :   Personal Goals (by the next clinic visit) :      Personal blood pressure goal: 140/90  (12/11/2009)   Hypertension self-management support: Not documented

## 2010-06-05 NOTE — Letter (Signed)
Summary: *Referral Letter  [facility]  [address]   [place], [place] [address]   Phone: [phone]  Fax: [phone]    07/06/2007  Thank you in advance for agreeing to see my patient:  Crystal Garza [address] [place], [place]  [address]  Phone: [phone]  Reason for Referral:   40 year old female with previous left arthroscopic knee surgery seen in office yesterday for several falls over past 2 weeks, most recent one on Sunday caused her to sublux her left patella (she feels it completely dislocated) with acute pain and swelling.  Now having difficulty with ambulation.  X-rays done without evidence of fracture.  Please evaluate re: physical therapy versus surgical intervention.  Patient obtained copies of x-rays to bring to her appointment for your review.  Current Medications: 1)  AMITRIPTYLINE HCL 75 MG TABS (AMITRIPTYLINE HCL) Take 1 tablet by mouth at bedtime 2)  FIORICET 50-325-40 MG TABS (BUTALBITAL-APAP-CAFFEINE) Take 1 tablet by mouth once a day 3)  TRAMADOL HCL 50 MG TABS (TRAMADOL HCL) Take 1 tablet by mouth three times a day 4)  ZYRTEC HIVES RELIEF 10 MG TABS (CETIRIZINE HCL) Take 1 tablet by mouth once a day 5)  LISINOPRIL 20 MG TABS (LISINOPRIL) 1 tab by mouth daily- needs appt w/ MD prior to additional refills 6)  IBUPROFEN 800 MG  TABS (IBUPROFEN) 1 tab by mouth three times a day as needed for pain  Past Medical History: 1)  `brain cyst` 2)  patellofemoral DJD 3)  supartz injection left knee  Thank you again for agreeing to see our patient; please contact us if you have any further questions or need additional information.  Sincerely,  Norton Blizzard MD  Appended Document: *Referral Letter Please arrange referral for patient to see Dr. Servando Salina has already seen him in the past).  Please send my referral note and office visit note.  Thanks!  Done.  Clinical Lists Changes  Orders: Added new Referral order of  Orthopedic Referral (Ortho) - Signed

## 2010-06-05 NOTE — Miscellaneous (Signed)
Summary: omeprazole approved  Clinical Lists Changes silverscript approve omeprazole thru 09/14/09. faxed to her pharmacy.Golden Circle RN  Sep 14, 2008 10:21 AM

## 2010-06-05 NOTE — Miscellaneous (Signed)
Summary: ROI  ROI   Imported By: Bradly Bienenstock 07/25/2009 11:51:39  _____________________________________________________________________  External Attachment:    Type:   Image     Comment:   External Document

## 2010-06-05 NOTE — Progress Notes (Signed)
Summary: refill  Phone Note Refill Request Call back at [phone] Message from:  Patient  Refills Requested: Medication #1:  VICODIN 5-500 MG TABS 1 by mouth by mouth two times a day. Initial call taken by: De Nurse,  April 14, 2010 4:29 PM  Follow-up for Phone Call        Vicodin was short term only- I will give her 10 more pills since she is still having pain, she should follow-up with her orthopedic surgeon, continue the anti-inflamatories as well Follow-up by: Milinda Antis MD,  April 14, 2010 5:37 PM    Prescriptions: VICODIN 5-500 MG TABS (HYDROCODONE-ACETAMINOPHEN) 1 by mouth by mouth two times a day  #10 x 0   Entered and Authorized by:   Milinda Antis MD   Signed by:   Milinda Antis MD on 04/14/2010   Method used:   Handwritten   RxID:   [id number]

## 2010-06-05 NOTE — Progress Notes (Signed)
Summary: Emergency Line Call  Phone Note Call from Patient Call back at [phone]   Caller: Patient Summary of Call: Call re: leg pain for one day, cramping in the calf, happened when laying in bed.  Leg is not swollen and is same color as other side.  toes are warm indicating adequate perfusion, pt without cough, SOB, CP.  No radiation of pain from back.  No urinary or fecal incontinence/retention.  Advised stretching, hydration, motrin/tylenol, and that this was the emergency line and was only to determine if a symptom was life threatening or not so I would not be calling in any medications, she should call [facility] for workin appt Monday.  Pt agreeable. Initial call taken by: Rodney Langton MD,  August 10, 2009 6:00 PM

## 2010-06-05 NOTE — Letter (Signed)
Summary: [facility]: Immunoassay Fecal Occult Blood (iFOB) Order [facility]  [address] [place], [place] [address]   Phone: [phone]  Fax: [phone]      [facility]: Immunoassay Fecal Occult Blood (iFOB) Order Form   October 16, 2009 MRN: [id number]   Crystal Garza 1970/07/05   Physicican Name:Emelie Newsom Diagnosis Code:_280.9     Merri Ray CMA (AAMA)

## 2010-06-05 NOTE — Progress Notes (Signed)
Summary: triage  Phone Note Call from Patient Call back at [phone]   Caller: Patient Summary of Call: having nose bleeds and spot on top of head real tender. Initial call taken by: De Nurse,  May 21, 2008 8:47 AM  Follow-up for Phone Call        c/o frequent nosebleeds. happens when she gets" too hot"  c/o having every day. taking meds. instructed her on how to stop a nose bleed. also c/o tender pot on head & cysts on scalp. work in scheduled for 2pm today for head complaint. states she needs a neuro referral for this  Follow-up by: Golden Circle RN,  May 21, 2008 9:09 AM

## 2010-06-05 NOTE — Letter (Signed)
Summary: New Patient letter  [facility]  [address] [place], [place] [address]   Phone: [phone]  Fax: [phone]       09/13/2009 MRN: [id number]  Crystal Garza [address] [place], [place]  [address]  Dear Crystal Garza,  Welcome to the Gastroenterology Division at [facility].    You are scheduled to see Dr.  Arlyce Dice on 10-16-09 at 10:45a.m. on the 3rd floor at [facility], [address]  We ask that you try to arrive at our office 15 minutes prior to your appointment time to allow for check-in.  We would like you to complete the enclosed self-administered evaluation form prior to your visit and bring it with you on the day of your appointment.  We will review it with you.  Also, please bring a complete list of all your medications or, if you prefer, bring the medication bottles and we will list them.  Please bring your insurance card so that we may make a copy of it.  If your insurance requires a referral to see a specialist, please bring your referral form from your primary care physician.  Co-payments are due at the time of your visit and may be paid by cash, check or credit card.     Your office visit will consist of a consult with your physician (includes a physical exam), any laboratory testing he/she may order, scheduling of any necessary diagnostic testing (e.g. x-ray, ultrasound, CT-scan), and scheduling of a procedure (e.g. Endoscopy, Colonoscopy) if required.  Please allow enough time on your schedule to allow for any/all of these possibilities.    If you cannot keep your appointment, please call [phone] to cancel or reschedule prior to your appointment date.  This allows [name] the opportunity to schedule an appointment for another patient in need of care.  If you do not cancel or reschedule by 5 p.m. the business day prior to your appointment date, you will be charged a $50.00 late cancellation/no-show fee.    Thank you for  choosing [facility] for your medical needs.  We appreciate the opportunity to care for you.  Please visit [name] at our website  to learn more about our practice.                     Sincerely,                                                             The Gastroenterology Division

## 2010-06-05 NOTE — Miscellaneous (Signed)
Summary: Lisinopril Refill- needs appt  Clinical Lists Changes  Medications: Added new medication of LISINOPRIL 20 MG TABS (LISINOPRIL) 1 tab by mouth daily- needs appt w/ MD prior to additional refills - Signed Rx of LISINOPRIL 20 MG TABS (LISINOPRIL) 1 tab by mouth daily- needs appt w/ MD prior to additional refills;  #30 x 0;  Signed;  Entered by: Neena Rhymes  MD;  Authorized by: Neena Rhymes  MD;  Method used: Electronic    Prescriptions: LISINOPRIL 20 MG TABS (LISINOPRIL) 1 tab by mouth daily- needs appt w/ MD prior to additional refills  #30 x 0   Entered and Authorized by:   Neena Rhymes  MD   Signed by:   Neena Rhymes  MD on 03/25/2007   Method used:   Electronically sent to ...       [facility] [address]       [address]       [place], [place]  [address]       Ph: [phone]       Fax: [phone]   RxID:   [id number]

## 2010-06-05 NOTE — Letter (Signed)
Summary: Lipid Letter  [facility]  [address]   [place], [place] [address]   Phone: [phone]  Fax: [phone]    12/12/2009  Iran Ouch [address] [place], [place]  [address]  Dear Liborio Nixon:  We have carefully reviewed your last lipid profile from 12/11/2009 and the results are noted below with a summary of recommendations for lipid management.    Cholesterol:       165     Goal: < 200   HDL "good" Cholesterol:   53     Goal: > 35   LDL "bad" Cholesterol:   95     Goal: < 100   Triglycerides:       87     Goal: < 150     Your Basic metabolic panel was normal (kidney function and electrolytes, diabetes screen all normal)  Your Hb was 11.4, your blood clotting cells are still high at 501. I think this is still your iron deficiency, please take the iron tablets twice a day.    If you have any questions, please call. We appreciate being able to work with you.   Sincerely,    [facility] Milinda Antis MD  Appended Document: Lipid Letter mailed

## 2010-06-05 NOTE — Assessment & Plan Note (Signed)
Summary: READ PPD/KH  Nurse Visit   Vitals Entered By: Gladstone Pih (Sep 23, 2009 11:08 AM)  Allergies: 1)  ! Codeine  PPD Results    Date of reading: 09/23/2009    Results: < 5mm    Interpretation: negative  Orders Added: 1)  No Charge Patient Arrived (NCPA0) [NCPA0]  Appended Document: Orders Update    Clinical Lists Changes  Orders: Added new Service order of No Charge Patient Arrived (NCPA0) (NCPA0) - Signed

## 2010-06-05 NOTE — Progress Notes (Signed)
Summary: queston re: appt  Phone Note Call from Patient Call back at [phone]   Reason for Call: Talk to Nurse Summary of Call: pt needs to know if she has to have an appt to get refills on her bp medication Initial call taken by: Knox Royalty,  Sep 12, 2007 3:24 PM  Follow-up for Phone Call        spoke with patient and appointment scheduled for 10/07/07  for check up and  to refill her meds. also she needs to talk with doctor about current medication for depression. patient needs refils on Lisinopril, protonix, and  wellbutrin. will send message to MD regarding refills to ask if she will refill enough to last until appointment on 10/07/07. [facility] [address]. Wellutrin and Protonix are not entered in to centricity but these meds are listed in paper cheart . will place paper chart in MD box for her review. Follow-up by: Theresia Lo RN,  Sep 12, 2007 4:22 PM  Additional Follow-up for Phone Call Additional follow up Details #1::        Will refill BP medication at this time (pt has not been seen for BP since 6/08) but won't provide refills after her scheduled appt if she misses it.  I have no record in the electronic system of Welbutrin or Protonix- please ask her when she last got these filled and who the prescribing MD is.  If it goes back as far as the written paper chart it's been over a year since she's had them filled.  I will decide on the other 2 meds when I have more info. Additional Follow-up by: Neena Rhymes  MD,  Sep 12, 2007 4:42 PM    Additional Follow-up for Phone Call Additional follow up Details #2::    patient reports she has been off wellbutrin for a year. states she had enough refills of protonix that lasted up until a month ago. Follow-up by: Theresia Lo RN,  Sep 12, 2007 5:28 PM  Additional Follow-up for Phone Call Additional follow up Details #3:: Details for Additional Follow-up Action Taken: will not refill Welbutrin w/out appt.  Protonix will  need a prior authorization so i will give her omeprazole instead.  Pt will not get any refills if she misses her appt. Additional Follow-up by: Neena Rhymes  MD,  Sep 12, 2007 5:41 PM  New/Updated Medications: OMEPRAZOLE 40 MG  CPDR (OMEPRAZOLE) 1 tab by mouth daily   Prescriptions: OMEPRAZOLE 40 MG  CPDR (OMEPRAZOLE) 1 tab by mouth daily  #30 x 0   Entered and Authorized by:   Neena Rhymes  MD   Signed by:   Neena Rhymes  MD on 09/12/2007   Method used:   Electronically sent to ...       [facility] [address]       [address] [place], [place]  [address]       Ph: [phone]       Fax: [phone]   RxID:   [id number] LISINOPRIL 20 MG TABS (LISINOPRIL) 1 tab by mouth daily- needs appt w/ MD prior to additional refills  #30 x 0   Entered and Authorized by:   Neena Rhymes  MD   Signed by:   Neena Rhymes  MD on 09/12/2007   Method used:   Electronically sent to ...       [facility]  [address]       [address]       [place], [place]  [address]       Ph: [phone]       Fax: [phone]   RxID:   [id number]     message left on voicemail that rx have been sent to pharmacy and message from Dr. Beverely Low .Theresia Lo RN  Sep 13, 2007 10:21 AM

## 2010-06-05 NOTE — Assessment & Plan Note (Signed)
Summary: tb test wp  Nurse Visit    Prior Medications: AMITRIPTYLINE HCL 75 MG TABS (AMITRIPTYLINE HCL) Take 1 tablet by mouth at bedtime FIORICET 50-325-40 MG TABS (BUTALBITAL-APAP-CAFFEINE) Take 1 tablet by mouth once a day TRAMADOL HCL 50 MG TABS (TRAMADOL HCL) Take 1 tablet by mouth three times a day ZYRTEC HIVES RELIEF 10 MG TABS (CETIRIZINE HCL) Take 1 tablet by mouth once a day LISINOPRIL 20 MG TABS (LISINOPRIL) 1 tab by mouth daily- needs appt w/ MD prior to additional refills IBUPROFEN 800 MG  TABS (IBUPROFEN) 1 tab by mouth three times a day as needed for pain Current Allergies: ! CODEINE   PPD Application    Vaccine Type: PPD    Site: left forearm    Mfr: Sanofi Pasteur    Dose: 0.1 ml    Route: ID    Given by: DELORES PATE CMA,    Exp. Date: 11/03/2009    Lot #: [id number]     ............................DELORES PATE-GADDY,CMA Duncan Dull) Sep 09, 2007 8:46 AM    Orders Added: 1)  TB Skin Test Maurine.Goes    ]

## 2010-06-05 NOTE — Letter (Signed)
Summary: [facility]  Missed appt on 10/25/07

## 2010-06-05 NOTE — Procedures (Signed)
Summary: Colonoscopy  Patient: Crystal Garza Note: All result statuses are Final unless otherwise noted.  Tests: (1) Colonoscopy (COL)   COL Colonoscopy           DONE     [facility]     [address] [place], [place]  [address]           COLONOSCOPY PROCEDURE REPORT           PATIENT:  Crystal Garza, Crystal Garza  MR#:  [id number]     BIRTHDATE:  1970-08-09, 39 yrs. old  GENDER:  female     ENDOSCOPIST:  Barbette Hair. Arlyce Dice, MD     REF. BY:     PROCEDURE DATE:  11/12/2009     PROCEDURE:  Diagnostic Colonoscopy     ASA CLASS:  Class II     INDICATIONS:  screening, family history of colon cancer Father at     35     MEDICATIONS:   Fentanyl 150 mcg, Versed 12.5 mg           DESCRIPTION OF PROCEDURE:   After the risks benefits and     alternatives of the procedure were thoroughly explained, informed     consent was obtained.  Digital rectal exam was performed and     revealed no abnormalities.   The  endoscope was introduced through     the anus and advanced to the cecum, which was identified by the     ileocecal valve, without limitations.  The quality of the prep was     excellent, using MoviPrep.  The instrument was then slowly     withdrawn as the colon was fully examined.     <<PROCEDUREIMAGES>>           FINDINGS:  A normal appearing cecum, ileocecal valve, and     appendiceal orifice were identified. The ascending, hepatic     flexure, transverse, splenic flexure, descending, sigmoid colon,     and rectum appeared unremarkable (see image2, image3, image4,     image6, image8, image10, image13, image14, and image15).     Retroflexed views in the rectum revealed no abnormalities.    The     scope was then withdrawn from the patient and the procedure     completed.           COMPLICATIONS:  None     ENDOSCOPIC IMPRESSION:     1) Normal colon     RECOMMENDATIONS:     1) Given your significant family history of colon cancer, you     should have a repeat colonoscopy in 5  years     REPEAT EXAM:  In 5 year(s) for Colonoscopy.           ______________________________     Barbette Hair. Arlyce Dice, MD           CC:  Milinda Antis MD           n.     Rosalie DoctorBarbette Hair. Tej Murdaugh at 11/12/2009 11:06 AM           Allred, Liborio Nixon, [id number]  Note: An exclamation mark (!) indicates a result that was not dispersed into the flowsheet. Document Creation Date: 11/12/2009 11:07 AM _______________________________________________________________________  (1) Order result status: Final Collection or observation date-time: 11/12/2009 11:02 Requested date-time:  Receipt date-time:  Reported date-time:  Referring Physician:   Ordering Physician: Melvia Heaps [phone]) Specimen Source:  Source: Launa Grill Order Number: [id number]  Lab site:   Appended Document: Colonoscopy Recall is in IDX for  11/2014.

## 2010-06-05 NOTE — Miscellaneous (Signed)
Summary: Abnormal Mammogram  Clinical Lists Changes  Problems: Added new problem of UNSPECIFIED ABNORMAL MAMMOGRAM (ICD-793.80)

## 2010-06-05 NOTE — Progress Notes (Signed)
Summary: Refills on all meds  Phone Note Call from Patient Call back at [phone]   Summary of Call: pt is needing a refill on all meds - has no refills left [facility] [address] Initial call taken by: Haydee Salter,  Sep 30, 2006 2:25 PM  Follow-up for Phone Call        Called pt and informed her that she must schedule an appt as she has not been seen since 8/07.  I will provide 30 days of medicine but after that no refills w/out appt.  She expressed understanding in this matter. Follow-up by: Neena Rhymes  MD,  Sep 30, 2006 2:53 PM

## 2010-06-05 NOTE — Assessment & Plan Note (Signed)
Summary: f/u last visit/eo   Vital Signs:  Patient profile:   40 year old female Weight:      384.0 pounds Temp:     98.0 degrees F oral Pulse rate:   95 / minute BP sitting:   120 / 82  (left arm)  Vitals Entered By: Alphia Kava (October 30, 2008 2:00 PM) CC: f/ui BP/ headaches Is Patient Diabetic? No   Primary Care Jasmain Ahlberg:  Milinda Antis MD  CC:  f/ui BP/ headaches.  History of Present Illness:    1) HTN - Elevated BP at last visit for unclear reasoning, 2 week follow-up.Pt tolerating Lisinopril without side effects.[facility]90 per report. Has been taking meds as scheduled. Denies change in diet habits.  Denies, vision changes,syncope, dizziness, SOB, occasionally has swelling in feet.  2.Migraines- improved since last visit, now back at baseline occuring 3x a wk - located on left side of neck radiating to head, lasting hours. Fiorocet helps pain. Patient is fearful that because her son is being released from jail tomorrow her headaches will worsen as well as her depression and mood changes.   Habits & Providers  Alcohol-Tobacco-Diet     Tobacco Status: never  Exercise-Depression-Behavior     Have you felt down or hopeless? yes     Have you felt little pleasure in things? yes     Depression Counseling: further diagnostic testing and/or other treatment is indicated  Current Medications (verified): 1)  Fioricet 50-325-40 Mg Tabs (Butalbital-Apap-Caffeine) .... Take 1 Tablet By Mouth Once A Day 2)  Zyrtec Hives Relief 10 Mg Tabs (Cetirizine Hcl) .... Take 1 Tablet By Mouth Once A Day 3)  Lisinopril 20 Mg Tabs (Lisinopril) .Marland Kitchen.. 1 Tab By Mouth Daily- Needs Appt W/ Md Prior To Additional Refills 4)  Omeprazole 40 Mg  Cpdr (Omeprazole) .Marland Kitchen.. 1 Tab By Mouth Daily 5)  Neurontin 300 Mg Caps (Gabapentin) .... Three At Bedtime (Dr Jorge Mandril) 6)  Mobic 7.5 Mg Tabs (Meloxicam) .... One Daily (Per Ortho) 7)  Sertraline Hcl 100 Mg Tabs (Sertraline Hcl) .... 1/2 Tablet  For One Week Then One Full Tablet  Allergies (verified): 1)  ! Codeine  Physical Exam  General:  Vital signs noted overweight-appearing.   Eyes:   EOMI. Perrla. Funduscopic exam benign, without hemorrhages, exudates or papilledema. Vision grossly normal. Heart:  Normal rate and regular rhythm. S1 and S2 normal without  murmur Extremities:  no edema pulses 2+   Impression & Recommendations:  Problem # 1:  HYPERTENSION, BENIGN SYSTEMIC (ICD-401.1) Assessment Improved  BP stable on lisinopril. BP stable in clinic goal < 140/90  Her updated medication list for this problem includes:    Lisinopril 20 Mg Tabs (Lisinopril) .Marland Kitchen... 1 tab by mouth daily- needs appt w/ md prior to additional refills  Orders: [facility]- Est  Level 4 ([address])  Problem # 2:  HEADACHE, UNSPECIFIED (ICD-784.0) Assessment: Improved  Now at baseline. Will  monitor with new stress with son returning home. Note pt has been seen by Headache clinic i in the past with little relief per pt Her updated medication list for this problem includes:    Fioricet 50-325-40 Mg Tabs (Butalbital-apap-caffeine) .Marland Kitchen... Take 1 tablet by mouth once a day    Mobic 7.5 Mg Tabs (Meloxicam) ..... One daily (per ortho)  Orders: [facility]- Est  Level 4 ([address])  Problem # 3:  MORBID OBESITY (ICD-278.01) Assessment: Unchanged  Reiterated importance of exercise and weight control on BP control and increased  risk for heart attack/stroke,DM with family history. Pt to follow-up for plan when ready to discuss plan.  Orders: [facility]- Est  Level 4 ([address])  Complete Medication List: 1)  Fioricet 50-325-40 Mg Tabs (Butalbital-apap-caffeine) .... Take 1 tablet by mouth once a day 2)  Zyrtec Hives Relief 10 Mg Tabs (Cetirizine hcl) .... Take 1 tablet by mouth once a day 3)  Lisinopril 20 Mg Tabs (Lisinopril) .Marland Kitchen.. 1 tab by mouth daily- needs appt w/ md prior to additional refills 4)  Omeprazole 40 Mg Cpdr (Omeprazole) .Marland Kitchen.. 1 tab by mouth daily 5)  Neurontin  300 Mg Caps (Gabapentin) .... Three at bedtime (dr Jorge Mandril) 6)  Mobic 7.5 Mg Tabs (Meloxicam) .... One daily (per ortho) 7)  Sertraline Hcl 100 Mg Tabs (Sertraline hcl) .... 1/2 tablet for one week then one full tablet  Patient Instructions: 1)  Continue your lisinopril daily- follow up for your blood pressure in 3 months 2)  Call to make an appointment to discuss weight loss. Prescriptions: FIORICET 50-325-40 MG TABS (BUTALBITAL-APAP-CAFFEINE) Take 1 tablet by mouth once a day  #60 Tablet x 1   Entered and Authorized by:   Milinda Antis MD   Signed by:   Milinda Antis MD on 10/30/2008   Method used:   Electronically to        [facility] [id number] (retail)       [address] [place], [place]  [address]       Ph: [phone]       Fax: [phone]   RxID:   [id number]

## 2010-06-05 NOTE — Assessment & Plan Note (Signed)
Summary: BP and  discuss depression meds/ls   Vital Signs:  Patient Profile:   40 Years Old Female Height:     66.5 inches Weight:      388 pounds Temp:     98.1 degrees F BP sitting:   112 / 80  (left arm)  Pt. in pain?   no  Vitals Entered By: Alphia Kava lpn              Is Patient Diabetic? No     PCP:  Neena Rhymes  MD  Chief Complaint:  htn and labs.  History of Present Illness: 40 yo woman w/ 1) HTN- BP well controlled on current meds (lisinopril).  Denies CP, SOB, N/V, HAs, edema.  2) Obesity- pt nearing 400 lbs.  had back injury this spring and is seeing Dr Jorge Mandril- MRI upcoming next week.  States injury has limited her physical activity.  Open to the idea of the nutrition center b/c she fears becoming diabetic which she knows is highly likely given family hx and current size.  3) Depression- Pt w/ hx of depression.  Sxs previously well controlled on Welbutrin but has been out of meds for months.  Mood currently stable but pt notes difference off meds.  No thoughts of harming herself or others.  Was taking amitriptylline and welbutrin together previously- never had any trouble.    Prior Medications Reviewed Using: Patient Recall  Prior Medication List:  AMITRIPTYLINE HCL 75 MG TABS (AMITRIPTYLINE HCL) Take 1 tablet by mouth at bedtime FIORICET 50-325-40 MG TABS (BUTALBITAL-APAP-CAFFEINE) Take 1 tablet by mouth once a day TRAMADOL HCL 50 MG TABS (TRAMADOL HCL) Take 1 tablet by mouth three times a day ZYRTEC HIVES RELIEF 10 MG TABS (CETIRIZINE HCL) Take 1 tablet by mouth once a day LISINOPRIL 20 MG TABS (LISINOPRIL) 1 tab by mouth daily- needs appt w/ MD prior to additional refills IBUPROFEN 800 MG  TABS (IBUPROFEN) 1 tab by mouth three times a day as needed for pain OMEPRAZOLE 40 MG  CPDR (OMEPRAZOLE) 1 tab by mouth daily   Current Allergies: ! CODEINE     Review of Systems      See HPI   Physical Exam  General:     obese, NAD, comfortable  appearing sitting. Head:     Normocephalic and atraumatic without obvious abnormalities. No apparent alopecia or balding. Eyes:     No corneal or conjunctival inflammation noted. EOMI. Perrla Neck:     Supple, no LAD Lungs:     Normal respiratory effort, chest expands symmetrically. Lungs are clear to auscultation, no crackles or wheezes. Heart:     Normal rate and regular rhythm. S1 and S2 normal without gallop, murmur, click, rub or other extra sounds. Abdomen:     Obese Pulses:     + 2 DP pulses Extremities:     No C/C/E Psych:     not anxious appearing and not depressed appearing.      Impression & Recommendations:  Problem # 1:  HYPERTENSION, BENIGN SYSTEMIC (ICD-401.1) Assessment: Improved Well controlled.  Continue current meds.  Check Cr given ACE. Her updated medication list for this problem includes:    Lisinopril 20 Mg Tabs (Lisinopril) .Marland Kitchen... 1 tab by mouth daily- needs appt w/ md prior to additional refills  Orders: Comp Met-[facility] ([id number]) [facility]- Est  Level 4 ([address])   Problem # 2:  OBESITY, NOS (ICD-278.00) Assessment: Unchanged Pt morbidly obese and at high risk for medical problems.  Pt aware.  Wants to make changes.  Open to idea of nutrition referral.  Will check FLP and CMP to assess for DM and hyperlipidemia.  Will call pt w/ lab results and determine whether she needs to come back sooner for discussion on possible new dx. Orders: Comp Met-[facility] [id number]) Lipid-[facility] ([id number]) Nutrition Referral (Nutrition) [facility]- Est  Level 4 ([address])   Problem # 3:  DEPRESSION, MAJOR, RECURRENT (ICD-296.30) Assessment: Unchanged Pt w/ hx of depression.  Off meds for awhile- notices a difference.  Wants to restart meds.  Will resume Welbutrin as this is what pt took in past.  Will start at lower dose and titrate up.  Reviewed sxs of serotonin syndrome w/ pt as she also takes tricyclic for sleep.  Has taken this in past w/out difficulty.  Pt expressed  understanding. Orders: [facility]- Est  Level 4 ([address])   Complete Medication List: 1)  Amitriptyline Hcl 75 Mg Tabs (Amitriptyline hcl) .... Take 1 tablet by mouth at bedtime 2)  Fioricet 50-325-40 Mg Tabs (Butalbital-apap-caffeine) .... Take 1 tablet by mouth once a day 3)  Tramadol Hcl 50 Mg Tabs (Tramadol hcl) .... Take 1 tablet by mouth three times a day 4)  Zyrtec Hives Relief 10 Mg Tabs (Cetirizine hcl) .... Take 1 tablet by mouth once a day 5)  Lisinopril 20 Mg Tabs (Lisinopril) .Marland Kitchen.. 1 tab by mouth daily- needs appt w/ md prior to additional refills 6)  Ibuprofen 800 Mg Tabs (Ibuprofen) .Marland Kitchen.. 1 tab by mouth three times a day as needed for pain 7)  Omeprazole 40 Mg Cpdr (Omeprazole) .Marland Kitchen.. 1 tab by mouth daily 8)  Wellbutrin Sr 150 Mg Tb12 (Bupropion hcl) .Marland Kitchen.. 1 tab by mouth qam x 7 days and then 1 tab two times a day.   Patient Instructions: 1)  Please schedule a follow-up appointment in 3 months to check blood pressure. 2)  Keep taking your medicines- the blood pressure looks fantastic! 3)  Take the Welbutrin in the morning for one week and then increase to twice a day after that 4)  I will call you with your lab results 5)  Try and exercise as best you can- I know it's hard w/ your back- but keep trying to walk 6)  Someone will contact you with your referral for the nutrition center 7)  Have a good summer!   Prescriptions: WELLBUTRIN SR 150 MG  TB12 (BUPROPION HCL) 1 tab by mouth QAM x 7 days and then 1 tab two times a day.  #60 x 6   Entered and Authorized by:   Neena Rhymes  MD   Signed by:   Neena Rhymes  MD on 10/07/2007   Method used:   Electronically sent to ...       [facility] [address]       [address]       [place], [place]  [address]       Ph: [phone]       Fax: [phone]   RxID:   [id number] AMITRIPTYLINE HCL 75 MG TABS (AMITRIPTYLINE HCL) Take 1 tablet by mouth at bedtime  #30 x 6   Entered and Authorized by:    Neena Rhymes  MD   Signed by:   Neena Rhymes  MD on 10/07/2007   Method used:   Electronically sent to ...       [facility] [address] [phone]  [address]       [place], [place]  [address]       Ph: [phone]       Fax: [phone]   RxID:   [id number] ZYRTEC HIVES RELIEF 10 MG TABS (CETIRIZINE HCL) Take 1 tablet by mouth once a day  #30 x 6   Entered and Authorized by:   Neena Rhymes  MD   Signed by:   Neena Rhymes  MD on 10/07/2007   Method used:   Electronically sent to ...       [facility] [address]       [address] [place], [place]  [address]       Ph: [phone]       Fax: [phone]   RxID:   [id number] OMEPRAZOLE 40 MG  CPDR (OMEPRAZOLE) 1 tab by mouth daily  #30 x 6   Entered and Authorized by:   Neena Rhymes  MD   Signed by:   Neena Rhymes  MD on 10/07/2007   Method used:   Electronically sent to ...       [facility] [address]       [address] [place], [place]  [address]       Ph: [phone]       Fax: [phone]   RxID:   [id number] LISINOPRIL 20 MG TABS (LISINOPRIL) 1 tab by mouth daily- needs appt w/ MD prior to additional refills  #30 x 6   Entered and Authorized by:   Neena Rhymes  MD   Signed by:   Neena Rhymes  MD on 10/07/2007   Method used:   Electronically sent to ...       [facility] [address]       [address]       [place], [place]  [address]       Ph: [phone]       Fax: [phone]   RxID:   [id number] FIORICET 50-325-40 MG TABS [facility]) Take 1 tablet by mouth once a day  #60 Tablet x 1   Entered and Authorized by:   Neena Rhymes  MD   Signed by:   Neena Rhymes  MD on 10/07/2007   Method used:   Electronically sent to ...       [facility] [address]       [address]       [place], [place]  [address]        Ph: [phone]       Fax: [phone]   RxID:   [id number]  ]

## 2010-06-05 NOTE — Progress Notes (Signed)
Summary: pls call  Phone Note Call from Patient Call back at Home Phone [phone] Call back at [phone]   Caller: Patient Reason for Call: Lab or Test Results Summary of Call: wants to know when referral is. Initial call taken by: De Nurse,  June 07, 2008 2:02 PM  Follow-up for Phone Call        [facility] said they did not receive referral.  Paperwork refaxed to [facility]. They said that Dr. Newell Coral said that she will not see her because there is no neurological problem. Will send to Dr. Jeanice Lim. Follow-up by: Modesta Messing LPN,  June 07, 2008 3:14 PM  Additional Follow-up for Phone Call Additional follow up Details #1::        Please speak with Dr. Lanier Prude to confirm if there was any Neurological defect. I have never evaluated the patient before and the referral was sent via Dr. Lanier Prude Additional Follow-up by: Milinda Antis MD,  June 07, 2008 3:33 PM      Appended Document: pls call No there was not and at the time I did not feel referral was appropriate but patient was set on it.

## 2010-06-05 NOTE — Progress Notes (Signed)
Summary: triage  Phone Note Call from Patient Call back at [phone]   Caller: Patient Summary of Call: Pt calling in due to having diahrrea.  Please see Dr. Roberts Gaudy note from 07/15/09.   Initial call taken by: Clydell Hakim,  July 16, 2009 2:03 PM  Follow-up for Phone Call        no longer vomiting but still has diarrhea despite using the immodium. will be here in 20 minutes. aware there may be a wait Follow-up by: Golden Circle RN,  July 16, 2009 2:07 PM

## 2010-06-05 NOTE — Assessment & Plan Note (Signed)
Summary: tb test,df  Nurse Visit   Vitals Entered By: Loralee Pacas CMA (Sep 20, 2009 11:53 AM)  Allergies: 1)  ! Codeine  Immunizations Administered:  PPD Skin Test:    Vaccine Type: PPD    Site: right forearm    Mfr: Sanofi Pasteur    Dose: 0.1 ml    Route: ID    Given by: Loralee Pacas CMA    Exp. Date: 10/11/2009    Lot #: [id number]  Orders Added: 1)  TB Skin Test [86580] 2)  Admin 1st Vaccine [phone]

## 2010-06-05 NOTE — Progress Notes (Signed)
Summary: Emergency Line Call  Phone Note Call from Patient Call back at [phone]   Caller: Patient Summary of Call: Pt c/o vomiting and diarrhea for several days now and associated fatigue.  Claims she ate a biscuit from [employer], then within 10 mins had watery diarrhea, no blood, no mucus.  Then started vomiting non-bloody, non- billious.  Trying to keep down water but vomits it up.  Has had up to 7 episodes of diarrhea and vomiting a day.  No sick contacts.  No abd pain, no fevers/chills, no rashes.  Recommended calling back at 8am for SDA.  Pt agreeable. Initial call taken by: Rodney Langton MD,  July 15, 2009 6:56 AM
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
## 2010-06-05 NOTE — Progress Notes (Signed)
Summary: ECHO- Results  Phone Note Outgoing Call   Call placed by: Milinda Antis MD,  December 20, 2009 11:11 AM Details for Reason: ECHO results Summary of Call: Normal ECHO, normal EF, mild LVF, told her importance of maintaining good BP control. All questions answered, no evdience of CHF as cause of SOB

## 2010-06-05 NOTE — Assessment & Plan Note (Signed)
Summary: Emeterio Reeve      Current Allergies: ! CODEINE        Complete Medication List: 1)  Amitriptyline Hcl 75 Mg Tabs (Amitriptyline hcl) .... Take 1 tablet by mouth at bedtime 2)  Fioricet 50-325-40 Mg Tabs (Butalbital-apap-caffeine) .... Take 1 tablet by mouth once a day 3)  Tramadol Hcl 50 Mg Tabs (Tramadol hcl) .... Take 1 tablet by mouth three times a day 4)  Zyrtec Hives Relief 10 Mg Tabs (Cetirizine hcl) .... Take 1 tablet by mouth once a day 5)  Lisinopril 20 Mg Tabs (Lisinopril) .Marland Kitchen.. 1 tab by mouth daily- needs appt w/ md prior to additional refills 6)  Ibuprofen 800 Mg Tabs (Ibuprofen) .Marland Kitchen.. 1 tab by mouth three times a day as needed for pain 7)  Omeprazole 40 Mg Cpdr (Omeprazole) .Marland Kitchen.. 1 tab by mouth daily 8)  Wellbutrin Sr 150 Mg Tb12 (Bupropion hcl) .Marland Kitchen.. 1 tab by mouth qam x 7 days and then 1 tab two times a day.    ]

## 2010-06-05 NOTE — Letter (Signed)
Summary: Generic Letter  [facility]  [address]   [place], [place] [address]   Phone: [phone]  Fax: [phone]    08/30/2008 MRN: [id number]  [address] [place], [place]  [address]  Dear [name],   All of your bloodwork came back normal, including your diabetes screening test. So you do not currently have diabetes but you are at very high risk of getting it due to your weight and your family history. It's important to follow a low calorie diet and be active (walking is wonderful) for at least 3-5 times a week.  Please do not hesitate to call if you have any questions.   Sincerely,   Lupita Raider MD [facility]  Appended Document: Generic Letter mailed

## 2010-06-05 NOTE — Assessment & Plan Note (Signed)
Summary: STD CHECK/KH   Vital Signs:  Patient profile:   40 year old female Height:      66.5 inches Weight:      384 pounds BMI:     61.27 Temp:     98.5 degrees F oral Pulse rate:   89 / minute BP sitting:   129 / 79  (right arm)  Vitals Entered By: Tessie Fass CMA (Sep 20, 2009 4:05 PM) CC: STD check. Dysuria Is Patient Diabetic? No Pain Assessment Patient in pain? no        Primary Care Provider:  Milinda Antis MD  CC:  STD check. Dysuria.  History of Present Illness:  Pt here for STD check, noticed foul odor to vagina past few days, some buring in vaginal area with urination. Same partnerx 14 years, denies vaginal discharge, no fever  Changed soaps this weekend when symptoms started, previously using [place] now with Dial  Habits & Providers  Alcohol-Tobacco-Diet     Tobacco Status: never  Current Medications (verified): 1)  Fioricet 50-325-40 Mg Tabs (Butalbital-Apap-Caffeine) .... Take 1 Tablet By Mouth Once A Day 2)  Lisinopril 20 Mg Tabs (Lisinopril) .Marland Kitchen.. 1 Tab By Mouth Daily 3)  Omeprazole 40 Mg  Cpdr (Omeprazole) .Marland Kitchen.. 1 Tab By Mouth Daily 4)  Gabapentin 400 Mg Caps (Gabapentin) .... One Tab By Mouth Tid 5)  Sertraline Hcl 100 Mg Tabs (Sertraline Hcl) .... Take 1 1/2 Tablet Daily 6)  Allegra 180 Mg Tabs (Fexofenadine Hcl) .Marland Kitchen.. 1 By Mouth Daily As Needed Allergy 7)  Promethazine Hcl 25 Mg Tabs (Promethazine Hcl) .... One Tab By Mouth Q6 Hours As Needed For Nausea/vomiting 8)  Nortriptyline Hcl 25 Mg Caps (Nortriptyline Hcl) .Marland Kitchen.. 1 By Mouth At Bedtime For Migraines 9)  Imitrex 50 Mg Tabs (Sumatriptan Succinate) .Marland Kitchen.. 1 By Mouth At Onset of Headache May Repeat in 2 Hours 10)  Alprazolam 0.5 Mg Tabs (Alprazolam) .Marland Kitchen.. 1 By Mouth Two Times A Day As Needed Anxiety  Allergies (verified): 1)  ! Codeine  Physical Exam  General:  Vitals reviewed. Morbidly obese, NAD Abdomen:  No CVA tenderness Non tender abdomen, large pannus Genitalia:  normal  introitus,foul fishy vaginal odor, no discharge, mild odor, large amount of redundant skin around labia majora. cervix visualzied, no lesions on cervix,    Impression & Recommendations:  Problem # 1:  DYSURIA (ICD-788.1) Assessment New No evidence of urinary tract infection. Plenty of water advised , may be secondary to irritant in vaginal area Orders: Urinalysis-[facility] (00000) [facility]- Est Level  3 (16109)  Problem # 2:  UNSPEC SYMPTOM ASSOC W/FEMALE GENITAL ORGANS (ICD-625.9) Assessment: New  Wet prep negative, GC/chlamydia pending, pt asked for complete STD check, therefore done. labs pending No evidence of vaginitis today. supportive care, switch soaps back, may be irritant related with change in pH  Orders: [facility]- Est Level  3 (60454)  Complete Medication List: 1)  Fioricet 50-325-40 Mg Tabs (Butalbital-apap-caffeine) .... Take 1 tablet by mouth once a day 2)  Lisinopril 20 Mg Tabs (Lisinopril) .Marland Kitchen.. 1 tab by mouth daily 3)  Omeprazole 40 Mg Cpdr (Omeprazole) .Marland Kitchen.. 1 tab by mouth daily 4)  Gabapentin 400 Mg Caps (Gabapentin) .... One tab by mouth tid 5)  Sertraline Hcl 100 Mg Tabs (Sertraline hcl) .... Take 1 1/2 tablet daily 6)  Allegra 180 Mg Tabs (Fexofenadine hcl) .Marland Kitchen.. 1 by mouth daily as needed allergy 7)  Promethazine Hcl 25 Mg Tabs (Promethazine hcl) .... One tab by mouth q6 hours as  needed for nausea/vomiting 8)  Nortriptyline Hcl 25 Mg Caps (Nortriptyline hcl) .Marland Kitchen.. 1 by mouth at bedtime for migraines 9)  Imitrex 50 Mg Tabs (Sumatriptan succinate) .Marland Kitchen.. 1 by mouth at onset of headache may repeat in 2 hours 10)  Alprazolam 0.5 Mg Tabs (Alprazolam) .Marland Kitchen.. 1 by mouth two times a day as needed anxiety  Other Orders: HIV-[facility] ([id number]) GC/Chlamydia-[facility] (87591/87491) [facility] [phone]) RPR-[facility] [phone]) Hep Bs Ag-[facility] ([id number])  Patient Instructions: 1)  You do not have a urinary tract infection 2)  The blood work for HIV, Syphillis and Hepatitis B will not come in  until Monday 3)  You other vaginal cultures will report monday 4)  Switch the soap back to your normal routine 5)  Eat plently of live culture Yogurt and drink plently of water 6)  Do not douche   Laboratory Results   Urine Tests  Date/Time Received: Sep 20, 2009 4:11 PM  Date/Time Reported: Sep 20, 2009 4:41 PM    Routine Urinalysis   Color: yellow Appearance: Clear Glucose: negative   (Normal Range: Negative) Bilirubin: negative   (Normal Range: Negative) Ketone: negative   (Normal Range: Negative) Spec. Gravity: 1.020   (Normal Range: 1.003-1.035) Blood: trace-intact   (Normal Range: Negative) pH: 7.0   (Normal Range: 5.0-8.0) Protein: negative   (Normal Range: Negative) Urobilinogen: 1.0   (Normal Range: 0-1) Nitrite: negative   (Normal Range: Negative) Leukocyte Esterace: negative   (Normal Range: Negative)  Urine Microscopic WBC/HPF: 0-3 RBC/HPF: 5-10 Bacteria/HPF: 1+ Mucous/HPF: 1+ Epithelial/HPF: 1-5    Comments: ...........test performed by...........Marland KitchenTerese Door, CMA  Date/Time Received: Sep 20, 2009 4:21 PM  Date/Time Reported: Sep 20, 2009 4:35 PM   [employer] Source: vaginal WBC/hpf: 1-5 Bacteria/hpf: 3+  Rods Clue cells/hpf: none  Negative whiff Yeast/hpf: none Trichomonas/hpf: none Comments: ...........test performed by...........Marland KitchenTerese Door, CMA

## 2010-06-05 NOTE — Miscellaneous (Signed)
Summary: Lab  Clinical Lists Changes  Orders: Added new Test order of Iron -[facility] [phone]) - Signed Added new Test order of Iron Binding Cap (TIBC)-[facility] ([id number]) - Signed Added new Test order of Ferritin-[facility] [phone]) - Signed Added new Test order of CRP, high sensitivity-[facility] ([id number]) - Signed Added new Test order of LDH-[facility] (24401) - Signed Added new Test order of Sed Rate (ESR)-[facility] [phone]) - Signed

## 2010-06-05 NOTE — Assessment & Plan Note (Signed)
Summary: back and leg pain/ACM  Medications Added AMITRIPTYLINE HCL 75 MG TABS (AMITRIPTYLINE HCL) Take 1 tablet by mouth at bedtime FIORICET 50-325-40 MG TABS (BUTALBITAL-APAP-CAFFEINE) Take 1 tablet by mouth once a day TRAMADOL HCL 50 MG TABS (TRAMADOL HCL) Take 1 tablet by mouth three times a day ZYRTEC HIVES RELIEF 10 MG TABS (CETIRIZINE HCL) Take 1 tablet by mouth once a day      Allergies Added: ! CODEINE  Vital Signs:  Patient Profile:   40 Years Old Female Height:     66.5 inches Weight:      380.7 pounds Temp:     98.2 degrees F Pulse rate:   79 / minute BP sitting:   140 / 73  (left arm)  Pt. in pain?   yes    Location:   back and chest     Intensity:   10    Type:       sharp  Vitals Entered ByJacki Cones RN (January 11, 2007 11:15 AM)                  PCP:  Neena Rhymes  MD  Chief Complaint:  back and leg pain - back pain radiating to chest.  History of Present Illness: BACK PAIN Location: mid thoracic Quality: stabbing pain Onset: back was hurting 2-3 months ago Severity (1-10):10 now.  was 5-6 back then Worse with:massaging area, leaning over Better with: sitting up Radiation:comes all the way around to the front, mid-chest.  Goes down into knees.   Trauma:none known Best sitting/standing/leaning forward? better sitting up. Had some palpitations and shortness of breath with exertion when going to mailbox yesterday - none currently and no sxs at rest.  Red Flags Fecal/urinary incontinence:no Numbness/Weakness:left arm with numbness - had BP cuff there Fever/chills/sweats:none Night pain: yes - especially in legs Unexplained weight loss: no No relief with bedrest: worse with lying down Morning stiffness: only for 5-6 mins h/o cancer/immunosuppression:no IV drug use:no PMH of osteoporosis or chronic steroid use:no  Has gained 12 pounds since last visit over 2 months ago. States she walks   Current Allergies (reviewed today): !  CODEINE Updated/Current Medications (including changes made in today's visit):  AMITRIPTYLINE HCL 75 MG TABS (AMITRIPTYLINE HCL) Take 1 tablet by mouth at bedtime FIORICET 50-325-40 MG TABS (BUTALBITAL-APAP-CAFFEINE) Take 1 tablet by mouth once a day TRAMADOL HCL 50 MG TABS (TRAMADOL HCL) Take 1 tablet by mouth three times a day ZYRTEC HIVES RELIEF 10 MG TABS (CETIRIZINE HCL) Take 1 tablet by mouth once a day       Physical Exam  General:     Obese, well-developed,well-nourished,in no acute distress; alert,appropriate and cooperative throughout examination Neck:     No deformities, masses, or tenderness noted. Chest Wall:     Reproducible chest pain at right side border of sternum and ribs. Lungs:     Normal respiratory effort, chest expands symmetrically. Lungs are clear to auscultation, no crackles or wheezes. Heart:     Normal rate and regular rhythm. S1 and S2 normal without gallop, murmur, click, rub or other extra sounds. Abdomen:     Obese Msk:     Negative SLR.  TTP midthoracic region in middle of back and left paraspinal region.  Mild TTP upper lumbar left paraspinal region.  TTP over left trapezius as well.  Reproducible pain with bending to left and forward at hips.   Pulses:     +2 DP and radial Neurologic:  No cranial nerve deficits noted. DTRs are symmetrical throughout. Sensory, motor and coordinative functions appear intact in all extremities.  Negative spurling bilaterally. Skin:     Intact without suspicious lesions or rashes    Impression & Recommendations:  Problem # 1:  BACK PAIN, THORACIC REGION (ICD-724.1) Discussed with attending.  Physical exam consistent with musculoskeletal etiology with worsening obesity.  Likely deconditioned - only walks 1 mile 3 days a week and has gained 12 pounds.  Stressed importance of exercise.  Will make PT referral but ultimately needs to make serious lifestyle changes if her pain is to improve.  Her updated  medication list for this problem includes:    Fioricet 50-325-40 Mg Tabs (Butalbital-apap-caffeine) .Marland Kitchen... Take 1 tablet by mouth once a day    Tramadol Hcl 50 Mg Tabs (Tramadol hcl) .Marland Kitchen... Take 1 tablet by mouth three times a day  Orders: [facility]- Est  Level 4 (24401) Physical Therapy Referral (PT)   Problem # 2:  OBESITY, NOS (ICD-278.00) Assessment: Deteriorated Gained 12 pounds.  PT referral - needs to make a stronger commitment to diet and exercise. Orders: [facility]- Est  Level 4 (99214)   Complete Medication List: 1)  Amitriptyline Hcl 75 Mg Tabs (Amitriptyline hcl) .... Take 1 tablet by mouth at bedtime 2)  Fioricet 50-325-40 Mg Tabs (Butalbital-apap-caffeine) .... Take 1 tablet by mouth once a day 3)  Tramadol Hcl 50 Mg Tabs (Tramadol hcl) .... Take 1 tablet by mouth three times a day 4)  Zyrtec Hives Relief 10 Mg Tabs (Cetirizine hcl) .... Take 1 tablet by mouth once a day   Patient Instructions: 1)  We will arrange physical therapy referral for you. 2)  Your pain in your back, chest, and legs is musculoskeletal.  Your heart and lungs sound great and all your nerves are working fine.  You have some tension in your muscles and sometimes this can cause pain and numbness.  Physical therapy will be great to show you some exercises to do at home and to build up some strength. 3)  Follow up with Dr. Beverely Low as previously discussed with her. 4)  Call immediately if you have acute chest pain, shortness of breath at rest, difficulty with bowel or bladder function. 5)  Can take tramadol 50mg  4-6 times a day.  Can also take tylenol 650mg  every 4 hours as needed (every 6 hours as needed if you are taking fioricet).    ]

## 2010-06-05 NOTE — Assessment & Plan Note (Signed)
Summary: f/up bp meds,tcb   Vital Signs:  Patient profile:   40 year old female Height:      66.5 inches Weight:      381.56 pounds BMI:     60.88 Temp:     98.0 degrees F oral Pulse rate:   80 / minute Pulse rhythm:   regular BP sitting:   118 / 72  (right arm)  Vitals Entered By: Modesta Messing LPN (December 04, 2008 3:29 PM) CC: Depression/ HTN Is Patient Diabetic? No Pain Assessment Patient in pain? no        Primary Care Provider:  Milinda Antis MD  CC:  Depression/ HTN.  History of Present Illness:   40 y.o. obese female presents for depression follow up:  History of depression currently on Zoloft 100mg  daily, wants Zoloft increased or to be restarted on wellbutrin. Pt son recently released from jail, has a history of bipolar. Since being at home approx 1 month has caused a lot of distress in patient. Pt admits to increased insomnia, poor appetite, being very irritable, having more anger outbursts and yelling at son. Pt denies suicidal ideations or homicidal ideations, no hallucinations. Is currently seeking couseling/support at [facility], son has also agreed to go there. Pt wants to continue recieving her medications through [facility]. Pt did note lately feels like her stomach is on fire, feels a lot of acid building up, denies chest pain, does not know if it is due to stress or not  Of note 1 month ago, pt mood was very good, was happy, smiling, sleeping a lot better.   2. Hypertension- has been taking Lisinopril as scheduled without problems  Current Medications (verified): 1)  Fioricet 50-325-40 Mg Tabs (Butalbital-Apap-Caffeine) .... Take 1 Tablet By Mouth Once A Day 2)  Zyrtec Hives Relief 10 Mg Tabs (Cetirizine Hcl) .... Take 1 Tablet By Mouth Once A Day 3)  Lisinopril 20 Mg Tabs (Lisinopril) .Marland Kitchen.. 1 Tab By Mouth Daily 4)  Omeprazole 40 Mg  Cpdr (Omeprazole) .Marland Kitchen.. 1 Tab By Mouth Daily 5)  Neurontin 300 Mg Caps (Gabapentin) .... Three At Bedtime (Dr Jorge Mandril) 6)  Mobic  7.5 Mg Tabs (Meloxicam) .... One Daily (Per Ortho) 7)  Sertraline Hcl 100 Mg Tabs (Sertraline Hcl) .... Take 1 1/2 Tablet Daily  Allergies (verified): 1)  ! Codeine  Review of Systems       Per HPI  Physical Exam  General:  Vital signs noted Obese Abdomen:  Bowel sounds positive,abdomen soft and non-tender without masses Psych:  Cognition and judgment appear intact. Alert and cooperative with normal attention span and concentration. No apparent delusions, illusions, hallucinations . No SI , good eye contact , pt appears upset, frustrated.    Impression & Recommendations:  Problem # 1:  DEPRESSION, MAJOR, RECURRENT (ICD-296.30) Assessment Deteriorated Pt mood has deteriorated, time course in conjunction with son returining from jail which both the patient and I knew would be difficult. Will increase Zoloft to 150mg  , reasses in 2-3 weeks. Pt contracted for saftey, Currently in process of getting couseling and support groups with [facility]. Medical release sent from that company. At this time will try increase in zoloft, if unchanged and insomnia still a problem will likely start Low dose seroquel to help with symptoms other options, Trazadone.  Orders: [facility]- Est  Level 4 ([address])  Problem # 2:  HYPERTENSION, BENIGN SYSTEMIC (ICD-401.1) Assessment: Improved PT optimal continue current meds Her updated medication list for this problem includes:  Lisinopril 20 Mg Tabs (Lisinopril) .Marland Kitchen... 1 tab by mouth daily  Orders: [facility]- Est  Level 4 ([address])  Problem # 3:  GASTROESOPHAGEAL REFLUX, NO ESOPHAGITIS (ICD-530.81) Increase in GERD symptoms unclear if related to change in stress vs appetite per pt limits fried foods and spicy foods, will give 3 weeks of high dose PPI and reasses Her updated medication list for this problem includes:    Omeprazole 40 Mg Cpdr (Omeprazole) .Marland Kitchen... 1 tab by mouth daily  Orders: [facility]- Est  Level 4 ([address])  Complete Medication List: 1)  Fioricet 50-325-40  Mg Tabs (Butalbital-apap-caffeine) .... Take 1 tablet by mouth once a day 2)  Zyrtec Hives Relief 10 Mg Tabs (Cetirizine hcl) .... Take 1 tablet by mouth once a day 3)  Lisinopril 20 Mg Tabs (Lisinopril) .Marland Kitchen.. 1 tab by mouth daily 4)  Omeprazole 40 Mg Cpdr (Omeprazole) .Marland Kitchen.. 1 tab by mouth daily 5)  Neurontin 300 Mg Caps (Gabapentin) .... Three at bedtime (dr Jorge Mandril) 6)  Mobic 7.5 Mg Tabs (Meloxicam) .... One daily (per ortho) 7)  Sertraline Hcl 100 Mg Tabs (Sertraline hcl) .... Take 1 1/2 tablet daily  Patient Instructions: 1)  For your depression take 1 1/2 tablet of Zoloft 2)  For your stomach pains take Omeprazole 40mg  twice a day for 3 weeks then start taking it daily again 3)  Follow-up in 2-3 weeks to see how things are doing with your mood and your stomach. 4)  Your blood pressure looks [place]!! Prescriptions: SERTRALINE HCL 100 MG TABS (SERTRALINE HCL) Take 1 1/2 tablet daily  #60 x 3   Entered and Authorized by:   Milinda Antis MD   Signed by:   Milinda Antis MD on 12/04/2008   Method used:   Electronically to        [facility] [address] (retail)       [address]       [place], [place]  [address]       Ph: [phone]       Fax: [phone]   RxID:   [id number] OMEPRAZOLE 40 MG  CPDR (OMEPRAZOLE) 1 tab by mouth daily  #30 x 6   Entered and Authorized by:   Milinda Antis MD   Signed by:   Milinda Antis MD on 12/04/2008   Method used:   Electronically to        [facility] [address] (retail)       [address]       [place], [place]  [address]       Ph: [phone]       Fax: [phone]   RxID:   [id number] LISINOPRIL 20 MG TABS (LISINOPRIL) 1 tab by mouth daily  #30 x 4   Entered and Authorized by:   Milinda Antis MD   Signed by:   Milinda Antis MD on 12/04/2008   Method used:   Electronically to        [facility] [address] (retail)       [address]       [place], [place]  [address]       Ph: [phone]       Fax: [phone]   RxID:   [id number]

## 2010-06-05 NOTE — Consult Note (Signed)
Summary: [facility]   Imported By: Denny Peon LEVAN 07/26/2007 11:17:50  _____________________________________________________________________  External Attachment:    Type:   Image     Comment:   External Document

## 2010-06-05 NOTE — Miscellaneous (Signed)
Summary: Orders Update  Clinical Lists Changes  Medications: Rx of AMITRIPTYLINE HCL 75 MG TABS (AMITRIPTYLINE HCL) Take 1 tablet by mouth at bedtime;  #30 x 3;  Signed;  Entered by: Luretha Murphy NP;  Authorized by: Luretha Murphy NP;  Method used: Electronic    Prescriptions: AMITRIPTYLINE HCL 75 MG TABS (AMITRIPTYLINE HCL) Take 1 tablet by mouth at bedtime  #30 x 3   Entered and Authorized by:   Luretha Murphy NP   Signed by:   Luretha Murphy NP on 06/10/2007   Method used:   Electronically sent to ...       [facility] [address]       [address]       [place], [place]  [address]       Ph: [phone]       Fax: [phone]   RxID:   [id number]

## 2010-06-05 NOTE — Letter (Signed)
Summary: Results Letter  [facility]  [address] [place], [place] [address]   Phone: [phone]  Fax: [phone]        October 16, 2009 MRN: [id number]    MARAL LAMPE [address] [place], [place]  [address]    Dear [name],  It is my pleasure to have treated you recently as a new patient in my office. I appreciate your confidence and the opportunity to participate in your care.  Since I do have a busy inpatient endoscopy schedule and office schedule, my office hours vary weekly. I am, however, available for emergency calls everyday through my office. If I am not available for an urgent office appointment, another one of our gastroenterologist will be able to assist you.  My well-trained staff are prepared to help you at all times. For emergencies after office hours, a physician from our Gastroenterology section is always available through my 24 hour answering service  Once again I welcome you as a new patient and I look forward to a happy and healthy relationship             Sincerely,  Louis Meckel MD  This letter has been electronically signed by your physician.  Appended Document: Results Letter mailed

## 2010-06-05 NOTE — Assessment & Plan Note (Signed)
Summary: FU/KH   Vital Signs:  Patient profile:   40 year old female Height:      66.5 inches Weight:      384 pounds Temp:     98.0 degrees F oral Pulse rate:   105 / minute Pulse rhythm:   regular BP sitting:   117 / 78  (left arm)  Vitals Entered By: Modesta Messing LPN (Sep 25, 2008 1:21 PM) CC: Follow up visit on new medication. Is Patient Diabetic? No Pain Assessment Patient in pain? no        Primary Care Jusiah Aguayo:  Milinda Antis MD  CC:  Follow up visit on new medication.Marland Kitchen  History of Present Illness: 40yr old presents with: Morbid obestiy, chronic leg pain presents for follow-up on new depression medication.   1) Depression - history of depression > 20 years has been on wellbutrin for > 5 years. Depression started when she found a cyst on her head at this time her father was diagnosed with Brain Cancer and subsequently died in 09/19/02, since then she has been very anxious about the cyst and depression symptoms have continued with other stressors in her life including her declining health and weight. Pt notes 1 of her sons is currently in prison at age 54 and she is caring for his daughter, her 78 y.o son has Bipolar diagnosed 3 years ago. Over the past few months has noticed increased irritability, short fuse, crying spells and depressive mood with consistent arguing with Bipolar son who does not follow-up with physician and does not take meds. Recently switched to Zoloft and tolerating well no adverse reactions noted, did have a period where she did not tirate as expected and had some insomnia. Currently taking zoloft 100mg  and Wellbutrin 300mg  daily. Pt beleives Zoloft has definitely helped her symptoms she feels more calm and at peace with her self, less arguing and mood swings with son.  Sleeping well, no change in appetite, gets enjoyment out of granddaughter, works 3 hours a day at [employer] and enjoys work, denies SI/HI  3) HTN - Pt tolerating meds without side  effects. Denies HA, vision changes, lightheadedness, syncope, dizziness, peripheral edema,SOB.    5) Obesity - HbA1C negative for diagnostic diabetes mellitus.   Current Medications (verified): 1)  Fioricet 50-325-40 Mg Tabs (Butalbital-Apap-Caffeine) .... Take 1 Tablet By Mouth Once A Day 2)  Zyrtec Hives Relief 10 Mg Tabs (Cetirizine Hcl) .... Take 1 Tablet By Mouth Once A Day 3)  Lisinopril 20 Mg Tabs (Lisinopril) .Marland Kitchen.. 1 Tab By Mouth Daily- Needs Appt W/ Md Prior To Additional Refills 4)  Omeprazole 40 Mg  Cpdr (Omeprazole) .Marland Kitchen.. 1 Tab By Mouth Daily 5)  Wellbutrin Sr 150 Mg  Tb12 (Bupropion Hcl) .... Two At Bedtime 6)  Neurontin 300 Mg Caps (Gabapentin) .... Three At Bedtime (Dr Jorge Mandril) 7)  Mobic 7.5 Mg Tabs (Meloxicam) .... One Daily (Per Ortho) 8)  Sertraline Hcl 100 Mg Tabs (Sertraline Hcl) .... 1/2 Tablet For One Week Then One Full Tablet  Allergies (verified): 1)  ! Codeine  Review of Systems       Per HPI  Physical Exam  General:  Well-developed,morbidly obest in no acute distress; alert,appropriate and cooperative throughout examination Lungs:  Normal respiratory effort, chest expands symmetrically. Lungs are clear to auscultation, no crackles or wheezes. Heart:  Normal rate and regular rhythm. S1 and S2 normal without gallop, murmur, click, rub or other extra sounds. Psych:  Cognition and judgment appear intact. Alert and  cooperative with normal attention span and concentration. No apparent delusions, illusions, hallucinations . No SI , good eye contact and not depressed appearing.  Smiles and laughs appropriately throughout conversation   Impression & Recommendations:  Problem # 1:  DEPRESSION, MAJOR, RECURRENT (ICD-296.30) Assessment Improved  Pt with long standing depression, she does not feel couseling has helped in the past. Will continue with zoloft. Titrate wellbutrin off by decreasing to 150mg  x 2 weeks, then 75mg  x 1 week. Will reasses on Zoloft with titration in  3 weeks. Contracted for saftey. Orders: [facility]- Est Level  3 ([address])  Problem # 2:  HYPERTENSION, BENIGN SYSTEMIC (ICD-401.1) Assessment: Improved  Continue current dose, HBA1c 5.8%- pre diabetes) Her updated medication list for this problem includes:    Lisinopril 20 Mg Tabs (Lisinopril) .Marland Kitchen... 1 tab by mouth daily- needs appt w/ md prior to additional refills  Orders: [facility]- Est Level  3 ([address])  Problem # 3:  OBESITY, NOS (ICD-278.00) Assessment: Unchanged As pt new to me this visit. Will address weight loss when stable on med regimine Orders: [facility]- Est Level  3 ([address])  Complete Medication List: 1)  Fioricet 50-325-40 Mg Tabs (Butalbital-apap-caffeine) .... Take 1 tablet by mouth once a day 2)  Zyrtec Hives Relief 10 Mg Tabs (Cetirizine hcl) .... Take 1 tablet by mouth once a day 3)  Lisinopril 20 Mg Tabs (Lisinopril) .Marland Kitchen.. 1 tab by mouth daily- needs appt w/ md prior to additional refills 4)  Omeprazole 40 Mg Cpdr (Omeprazole) .Marland Kitchen.. 1 tab by mouth daily 5)  Wellbutrin Sr 150 Mg Tb12 (Bupropion hcl) .... Two at bedtime 6)  Neurontin 300 Mg Caps (Gabapentin) .... Three at bedtime (dr Jorge Mandril) 7)  Mobic 7.5 Mg Tabs (Meloxicam) .... One daily (per ortho) 8)  Sertraline Hcl 100 Mg Tabs (Sertraline hcl) .... 1/2 tablet for one week then one full tablet  Patient Instructions: 1)  Start decreasing your Wellbutrin tomorrow ( Wed) 5/26) 2)   Start taking Wellbutrin 150mg  for 2 weeks, then take 75mg , 1/2 tablet for 1 week then stop. 3)  Continue taking the zoloft. If you have any side effects please call. 4)  If you have any thoughts about hurting yourself or others- go the the Emergency room or call [facility], 5)  Please follow-up with me in 3 weeks to see how you are doing

## 2010-06-05 NOTE — Assessment & Plan Note (Signed)
Summary: colon//family hx of colon cancer--ch.   History of Present Illness Visit Type: Initial Consult Primary GI MD: Melvia Heaps MD [facility] Primary Provider: Milinda Antis MD Requesting Provider: Milinda Antis MD Chief Complaint: Patient here for consult before colonoscopy pts father was dx of colon cancer at age 42. She has frequent BMs after meals and nausea.  History of Present Illness:   [name] is a pleasant 40 year old Afro-American female referred at the request of Dr. Jeanice Lim for screening colonoscopy.  Her father developed colon cancer at age 42.  She has no GI complaints including change of bowel habits, abdominal pain, melena or hematochezia.  CBC one month ago was pertinent for hemoglobin 11.8 and MCV of 80.  She denies menstrual blood loss.  She is status post partial hysterectomy 6 years ago.  She is on no gastric irritants including nonsteroidals.   GI Review of Systems    Reports nausea.      Denies abdominal pain, acid reflux, belching, bloating, chest pain, dysphagia with liquids, dysphagia with solids, heartburn, loss of appetite, vomiting, vomiting blood, weight loss, and  weight gain.      Reports change in bowel habits.     Denies anal fissure, black tarry stools, constipation, diverticulosis, fecal incontinence, heme positive stool, hemorrhoids, irritable bowel syndrome, jaundice, light color stool, liver problems, rectal bleeding, and  rectal pain. Preventive Screening-Counseling & Management      Drug Use:  no.      Current Medications (verified): 1)  Fioricet 50-325-40 Mg Tabs (Butalbital-Apap-Caffeine) .... Take 1 Tablet By Mouth Once A Day 2)  Lisinopril 20 Mg Tabs (Lisinopril) .Marland Kitchen.. 1 Tab By Mouth Daily 3)  Omeprazole 40 Mg  Cpdr (Omeprazole) .Marland Kitchen.. 1 Tab By Mouth Daily 4)  Gabapentin 400 Mg Caps (Gabapentin) .... One Tab By Mouth Tid 5)  Sertraline Hcl 100 Mg Tabs (Sertraline Hcl) .... Take 1 1/2 Tablet Daily 6)  Allegra 180 Mg Tabs (Fexofenadine Hcl)  .Marland Kitchen.. 1 By Mouth Daily As Needed Allergy 7)  Promethazine Hcl 25 Mg Tabs (Promethazine Hcl) .... One Tab By Mouth Q6 Hours As Needed For Nausea/vomiting 8)  Nortriptyline Hcl 25 Mg Caps (Nortriptyline Hcl) .Marland Kitchen.. 1 By Mouth At Bedtime For Migraines 9)  Imitrex 50 Mg Tabs (Sumatriptan Succinate) .Marland Kitchen.. 1 By Mouth At Onset of Headache May Repeat in 2 Hours 10)  Alprazolam 0.5 Mg Tabs (Alprazolam) .Marland Kitchen.. 1 By Mouth Two Times A Day As Needed Anxiety  Allergies (verified): 1)  ! 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 Anxiety Disorder Arthritis Depression GERD Hypertension Kidney Stones Obesity  Past Surgical History: carpel tunnel release R - 09/02/2003 CT chest neg for PE - 09/30/2004 L knee arthroscopy x 3 - 05/04/2002 LE Doppler venous neg for DVT - 09/30/2004 TAH (without oophorectomy) supracervical  - 04/03/2004 c section Hysterectomy partial  Family History: Brother 30- healthy Father- (Crystal Garza) died age 69 w/ brain CA, had Heart dz, DM, colon CA Age 86  Mother healthy Sister- 35 healthy Family History of Colon Cancer: dather dxed age 35 deceased colon cancer mets to brain  Social History: Lives w/ Fiance and 3 kids- Almonzo, Multimedia programmer and [employer].    Non-smoker. Denies ETOH.   Finished H.S.   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  The patient denies anemia, blood in urine, breast changes/lumps, change in vision, confusion, cough, coughing up blood, fainting, fatigue, fever, hearing problems, heart murmur, heart rhythm changes, itching, menstrual pain, muscle pains/cramps, night sweats, nosebleeds, pregnancy symptoms, shortness of breath, skin rash, sleeping problems, sore  throat, swollen lymph glands, thirst - excessive , urination - excessive , urination changes/pain, urine leakage, vision changes, and voice change.         All other systems were reviewed and were negative   Vital Signs:  Patient profile:   40 year old female Height:      66.5 inches Weight:      385.6 pounds BMI:     61.53 Pulse rate:   86 / minute Pulse rhythm:   regular BP sitting:   130 / 82  (right arm) Cuff size:   large  Vitals Entered By: Harlow Mares CMA Duncan Dull) (October 16, 2009 11:18 AM)  Physical Exam  Additional Exam:  On physical exam she is an obese female  skin: anicteric HEENT: normocephalic; PEERLA; no nasal or pharyngeal abnormalities neck: supple nodes: no cervical lymphadenopathy chest: clear to ausculatation and percussion heart: no murmurs, gallops, or rubs abd: soft, nontender; BS normoactive; no abdominal masses, tenderness, organomegaly rectal: deferred ext: no cynanosis, clubbing, edema skeletal: no deformities neuro: oriented x 3; no focal abnormalities    Impression & Recommendations:  Problem # 1:  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  Patient's questions were answered.  Orders: ZCOL (ZCOL) TLB-CBC Platelet - w/Differential (85025-CBCD) TLB-Ferritin (82728-FER) TLB-Iron, (Fe) Total (83540-FE) TLB-IBC Pnl (Iron/FE;Transferrin) (83550-IBC)  Problem # 3:  MORBID OBESITY (ICD-278.01) Assessment: Comment Only  Patient Instructions: 1)  Copy sent to : Milinda Antis MD 2)  Your Colonoscopy is scheduled for 11/12/2009 at [facility] at 9:30am 3)  You can pick up your Colonoscopy prep from your pharmacy today 4)  Colonoscopy and Flexible Sigmoidoscopy  brochure given.  5)  Conscious Sedation brochure given.  6)  The medication list was reviewed and reconciled.  All changed / newly prescribed medications were explained.  A complete medication list was provided to the patient / caregiver. Prescriptions: MOVIPREP 100 GM  SOLR (PEG-KCL-NACL-NASULF-NA ASC-C) As per prep instructions.  #1 x 0   Entered by:   Merri Ray CMA (AAMA)   Authorized by:   Louis Meckel MD   Signed by:   Merri Ray CMA (AAMA) on 10/16/2009   Method used:   Electronically to        [facility] [id number] (retail)       [address] [place], [place]  [address]       Ph: [phone]       Fax: [phone]   RxID:   [id number]

## 2010-06-05 NOTE — Miscellaneous (Signed)
Summary: Orders Update  Clinical Lists Changes  Medications: Rx of AMITRIPTYLINE HCL 75 MG TABS (AMITRIPTYLINE HCL) Take 1 tablet by mouth at bedtime;  #1 x 3;  Signed;  Entered by: Luretha Murphy NP;  Authorized by: Luretha Murphy NP;  Method used: Electronic    Prescriptions: AMITRIPTYLINE HCL 75 MG TABS (AMITRIPTYLINE HCL) Take 1 tablet by mouth at bedtime  #1 x 3   Entered and Authorized by:   Luretha Murphy NP   Signed by:   Luretha Murphy NP on 06/10/2007   Method used:   Electronically sent to ...       [facility] [address]       [address]       [place], [place]  [address]       Ph: [phone]       Fax: [phone]   RxID:   [id number]

## 2010-06-05 NOTE — Progress Notes (Signed)
Summary: Outpt needs pt number  Phone Note From Other Clinic   Caller: [facility] Summary of Call: was trying to scheduled pts PT, pt does not have a working number, sts if pt calls to have her call them so they can schedule her PT Initial call taken by: ERIN LEVAN,  January 13, 2007 11:23 AM

## 2010-06-05 NOTE — Letter (Signed)
Summary: [facility]  ROI   Imported By: Marily Memos 04/03/2010 15:07:58  _____________________________________________________________________  External Attachment:    Type:   Image     Comment:   External Document

## 2010-06-05 NOTE — Progress Notes (Signed)
Summary: Triage  Phone Note Call from Patient Call back at [phone]   Reason for Call: Talk to Nurse Summary of Call: Pt wants to speak with a nurse about falling on Sunday. Initial call taken by: Haydee Salter,  July 05, 2007 9:17 AM  Follow-up for Phone Call        fell on both knees, r hip. taking muscle relaxer & motrin. used ice.  hurts to use knees. appt made for today. to use tyl or motrin with food Follow-up by: Golden Circle RN,  July 05, 2007 9:39 AM
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
## 2010-06-05 NOTE — Assessment & Plan Note (Signed)
Summary: fell on ice   Vital Signs:  Patient Profile:   40 Years Old Female Height:     66.5 inches Weight:      381.6 pounds Temp:     99.0 degrees F Pulse rate:   101 / minute BP sitting:   131 / 87  (left arm)  Pt. in pain?   yes    Location:   legs    Intensity:   9    Type:       sharp and stinging  Vitals Entered ByJacki Cones RN (July 05, 2007 4:11 PM)                  Serial Vital Signs/Assessments:  Comments: Orthostatic assessment - lying - 149/99, pulse 103 sitting - 163/102, pulse 94 standing - 139/95, 115 ..................................................................Marland KitchenAMY MARTIN RN  July 05, 2007 4:21 PM  By: Jacki Cones RN    PCP:  Neena Rhymes  MD  Chief Complaint:  fell on ice 3-4 times- now has pain in both legs.  History of Present Illness: 40 year old female here c/o bilateral knee pain, right hip pain.  1. Bilateral knee pain - patient has fallen down 4 times over the past two weeks.  One week ago she slipped on floor in house and fell onto her knees.  Then Sunday night she slipped outside and her left kneecap "came out of place," displacing laterally, causing severe pain and sudden onset of swelling.  She states something similar happened in 2004 when her 'kneecap came out' and she required arthroscopy.  When she was trying to stand up, she fell onto her right side.  She has been using ice, motrin 800mg  three times a day.  Walking with a cane.  2. Right hip pain - Hurt this side when she was trying to get up on Sunday.  Has been able to ambulate but with difficulty.    Current Allergies (reviewed today): ! CODEINE  Past Surgical History:    Reviewed history from 11/01/2006 and no changes required:       carpel tunnel release R - 09/02/2003       CT chest neg for PE - 09/30/2004       L knee arthroscopy x 3 - 05/04/2002       LE Doppler venous neg for DVT - 09/30/2004       TAH (without oophorectomy) - 04/03/2004      Physical  Exam  General:     obese, NAD, comfortable appearing sitting. Msk:     L knee: Swelling medially more compared to right.  No ecchymoses, atrophy of muscles TTP around kneecap, worse medially.  + apprehension sign.  TTP medial and lateral joint spaces but less so than around patella FROM with flexion and extension though painful at extremes of ROM LCL and MCL intact.  Negative anterior drawer  R knee: No swelling, ecchymoses, atrophy of muscles Mild TTP medial joint line.  No apprehension sign.   FROM with flexion and extension. LCL and MCL intact.  Negative anterior drawer.  R hip: No TTP over trochanter FROM - pain with ext rotation, less with internal rotation    Impression & Recommendations:  Problem # 1:  KNEE PAIN, BILATERAL (GNF-621.30) Assessment: New See patient instructions.  Will r/o fx with x-rays bilaterally.  Her updated medication list for this problem includes:    Fioricet 50-325-40 Mg Tabs (Butalbital-apap-caffeine) .Marland Kitchen... Take 1 tablet by mouth once a day  Tramadol Hcl 50 Mg Tabs (Tramadol hcl) .Marland Kitchen... Take 1 tablet by mouth three times a day    Ibuprofen 800 Mg Tabs (Ibuprofen) .Marland Kitchen... 1 tab by mouth three times a day as needed for pain  Orders: [facility]- Est Level  3 ([address]) Diagnostic X-Ray/Fluoroscopy (Diagnostic X-Ray/Flu) Diagnostic X-Ray/Fluoroscopy (Diagnostic X-Ray/Flu)   Problem # 2:  HIP PAIN, RIGHT (ICD-719.45) Assessment: New Will check pelvic, right hip x-rays  Her updated medication list for this problem includes:    Fioricet 50-325-40 Mg Tabs (Butalbital-apap-caffeine) .Marland Kitchen... Take 1 tablet by mouth once a day    Tramadol Hcl 50 Mg Tabs (Tramadol hcl) .Marland Kitchen... Take 1 tablet by mouth three times a day    Ibuprofen 800 Mg Tabs (Ibuprofen) .Marland Kitchen... 1 tab by mouth three times a day as needed for pain  Orders: [facility]- Est Level  3 ([address])   Complete Medication List: 1)  Amitriptyline Hcl 75 Mg Tabs (Amitriptyline hcl) .... Take 1 tablet by mouth at  bedtime 2)  Fioricet 50-325-40 Mg Tabs (Butalbital-apap-caffeine) .... Take 1 tablet by mouth once a day 3)  Tramadol Hcl 50 Mg Tabs (Tramadol hcl) .... Take 1 tablet by mouth three times a day 4)  Zyrtec Hives Relief 10 Mg Tabs (Cetirizine hcl) .... Take 1 tablet by mouth once a day 5)  Lisinopril 20 Mg Tabs (Lisinopril) .Marland Kitchen.. 1 tab by mouth daily- needs appt w/ md prior to additional refills 6)  Ibuprofen 800 Mg Tabs (Ibuprofen) .Marland Kitchen.. 1 tab by mouth three times a day as needed for pain   Patient Instructions: 1)  Be careful especially today and tomorrow walking out on the ice. 2)  Use the cane as you have been - holding it in your right hand when you walk with your left leg. 3)  Make sure you ask for copies of your x-rays when you go get them so you can take them to Dr. Jorge Mandril. 4)  We will contact [facility] about hopefully getting you a walker while you recover. 5)  Take motrin as needed. 6)  Try to stay as active as possible. 7)  Ice at least 3 times a day for 15 minutes at a time, elevate your leg above your heart when sitting, and apply an ACE bandage over your left knee. 8)  We will talk about follow-up on the phone.  If I see something grossly abnormal on the x-rays tonight I will call you tonight.    Prescriptions: IBUPROFEN 800 MG  TABS (IBUPROFEN) 1 tab by mouth three times a day as needed for pain  #90 x 1   Entered and Authorized by:   Norton Blizzard MD   Signed by:   Norton Blizzard MD on 07/05/2007   Method used:   Print then Give to Patient   RxID:   [id number]  ]

## 2010-06-05 NOTE — Assessment & Plan Note (Signed)
Summary: F/U EO   Vital Signs:  Patient profile:   40 year old female Height:      66.5 inches Weight:      385 pounds BMI:     61.43 Temp:     98.0 degrees F oral Pulse rate:   70 / minute Pulse rhythm:   regular BP sitting:   156 / 96  (right arm)  Vitals Entered By: Modesta Messing LPN (October 16, 2008 1:29 PM)  Serial Vital Signs/Assessments:  Time      Position  BP       Pulse  Resp  Temp     By                     145/90                         Milinda Antis MD  CC: Stopped Wellbutrin and checkup today to see how she is doing.  Rash left arm. Is Patient Diabetic? No Pain Assessment Patient in pain? no        Primary Care Provider:  Milinda Antis MD  CC:  Stopped Wellbutrin and checkup today to see how she is doing.  Rash left arm.Marland Kitchen  History of Present Illness: 40yr old presents with: Morbid obestiy, chronic leg pain presents for follow-up on new depression medication.   1) Depression - States feels calm now, appetite improving, sleeping well. No SI/HI. Son with Bipolar is now in jail for 45days did not expand on reasoning. Has been taking care of grand-daughter which she enjoys.  Feels like Zoloft works for her. Titrated off Wellbutrin in 2 1/2 weeks instead of 3 weeks.   2) HTN - Pt tolerating meds without side effects. Elevated BP this am and last week at [facility] BP 160/100 per report. Has been taking meds as scheduled. Denies change in diet habits. Has one episode of vomiting last night. Denies, vision changes,syncope, dizziness, peripheral edema,SOB.  Admits to  mild  lightheadedness and daily headaches simular to her migraines in the past. Headaches have increased from 2-3x a week to daily, releived by fiorcet- pain starts in back of neck and moves around, can last for hours.  3. Rash-  on left arm for 1 week now, itches some, no other area of rash, denies any previous rash. Denies any contacts with plants, animals, only change is tapering of medication. No pus  from rash. States rash looks better this week has been using cortisone OTC.  Allergies: 1)  ! Codeine  Review of Systems       Per HPI  Physical Exam  General:  Well-developed,morbidly obest in no acute distress; alert,appropriate and cooperative throughout examination Head:  Normocephalic and atraumatic without obvious abnormalities. Eyes:   EOMI. Perrla. Funduscopic exam benign, without hemorrhages, exudates or papilledema. Vision grossly normal. Neck:  full ROM.   Lungs:   Lungs are clear to auscultation, no crackles or wheezes. Heart:  Normal rate and regular rhythm. S1 and S2 normal without  murmur Pulses:  Pulses 2+ Extremities:  No edema Neurologic:  No cranial nerve deficits noted. Station and gait are normal.  motor and coordinative functions appear intact. Skin:  hyperpigmented maculopapular rash on left arm in antecubital and lateral aspect of elbow . no erythema, no drainage, multiple scabbing healed lesions in same area Psych:  Cognition and judgment appear intact. Alert and cooperative with normal attention span and concentration. No apparent delusions,  illusions, hallucinations . No SI , good eye contact and not depressed appearing.  Smiles and laughs appropriately throughout conversation   Impression & Recommendations:  Problem # 1:  DEPRESSION, MAJOR, RECURRENT (ICD-296.30) Assessment Improved  Pt stable on Zoloft, feels it is working better than Wellbutrin. Will continue at 100mg  daily. Pt contracted for saftey. I do not beleive the rash has anything to do with the medication- no characterisitc of any steven's johnson hypersensitivity rash.   Orders: [facility]- Est  Level 4 ([address])  Problem # 2:  HYPERTENSION, BENIGN SYSTEMIC (ICD-401.1) Assessment: Deteriorated   BP fairly elevated for [name] normally systolic in low 110's. Repeat BP improved but still high, pt has a lot of stress in life with son being taken to jail since last visit. Will reasses BP in 2 weeks,  pt to bring in readings from outside. At this point continue Lisinopril will add HCTZ if needed as cheap medication good adjunct, unclear why pt not recently on this medication.  Her updated medication list for this problem includes:    Lisinopril 20 Mg Tabs (Lisinopril) .Marland Kitchen... 1 tab by mouth daily- needs appt w/ md prior to additional refills  Orders: [facility]- Est  Level 4 ([address])  Problem # 3:  SKIN RASH (ICD-782.1) Assessment: New  Unclear diagnosis of rash, although looks well into healing/clearing process. Not well circumcised to suggest tinea , does not appear eczematous or infected. Will use cortisone cream otc as needed as clearing now. RED Flags given  Orders: [facility]- Est  Level 4 ([address])  Problem # 4:  HEADACHE, UNSPECIFIED (ICD-784.0) Assessment: Deteriorated   Headaches may be increased secondary to elevated blood pressure  or from withdrawel of Wellbutrin although replaced with another SSRI which pt has been on for > 1 month. Will follow-up in 2 weeks to see if any changes. No focal deficits on neuro exam to warrent any imaging.  Her updated medication list for this problem includes:    Fioricet 50-325-40 Mg Tabs (Butalbital-apap-caffeine) .Marland Kitchen... Take 1 tablet by mouth once a day    Mobic 7.5 Mg Tabs (Meloxicam) ..... One daily (per ortho)  Orders: [facility]- Est  Level 4 ([address])  Complete Medication List: 1)  Fioricet 50-325-40 Mg Tabs (Butalbital-apap-caffeine) .... Take 1 tablet by mouth once a day 2)  Zyrtec Hives Relief 10 Mg Tabs (Cetirizine hcl) .... Take 1 tablet by mouth once a day 3)  Lisinopril 20 Mg Tabs (Lisinopril) .Marland Kitchen.. 1 tab by mouth daily- needs appt w/ md prior to additional refills 4)  Omeprazole 40 Mg Cpdr (Omeprazole) .Marland Kitchen.. 1 tab by mouth daily 5)  Wellbutrin Sr 150 Mg Tb12 (Bupropion hcl) .... Two at bedtime 6)  Neurontin 300 Mg Caps (Gabapentin) .... Three at bedtime (dr Jorge Mandril) 7)  Mobic 7.5 Mg Tabs (Meloxicam) .... One daily (per ortho) 8)  Sertraline Hcl 100 Mg  Tabs (Sertraline hcl) .... 1/2 tablet for one week then one full tablet  Patient Instructions: 1)  For your headaches- Take Fiorect as needed, if they worsen go to ER or come back in for visit 2)  For your rash you can use Hydrocortisone over the counter- if the rash spreads or worsens please call the clinic 3)  For your blood pressure- take your blood pressure 2x a week at the [facility] and Bring in for next visit. 4)  Schedule an appt to see me in 2 weeks.

## 2010-06-05 NOTE — Letter (Signed)
Summary: Out of Work  [facility]  [address]   [place], [place] [address]   Phone: [phone]  Fax: [phone]    July 05, 2007   Employee:  Crystal Garza    To Whom It May Concern:   For Medical reasons, please excuse the above named employee from work for the following dates:  Start:   07/04/07  Return to work: 07/06/07  If you need additional information, please feel free to contact our office.         Sincerely,    AMY MARTIN RN

## 2010-06-05 NOTE — Miscellaneous (Signed)
Summary: Problem list updated  Clinical Lists Changes  Problems: Removed problem of EDEMA LEG (ICD-782.3) Removed problem of DYSPNEA (ICD-786.09) Removed problem of NAUSEA (ICD-787.02) Removed problem of RBC HYPOCHROMIA (ICD-280.9) Removed problem of THROMBOCYTOSIS (ICD-289.9) Removed problem of NONSPEC REACT TUBERCULIN SKN TEST W/O ACTV TB (ICD-795.5) Removed problem of UNSPEC SYMPTOM ASSOC W/FEMALE GENITAL ORGANS (ICD-625.9) Removed problem of SEXUALLY TRANSMITTED DISEASE, EXPOSURE TO (ICD-V01.6) Removed problem of DYSURIA (ICD-788.1) Removed problem of FM HX MALIGNANT NEOPLASM GASTROINTESTINAL TRACT (ICD-V16.0) Removed problem of UNSPECIFIED ABNORMAL MAMMOGRAM (ICD-793.80) Removed problem of BACK PAIN, LOW (ICD-724.2) Changed problem from KNEE PAIN, BILATERAL (ICD-719.46) to OSTEOARTHRITIS, KNEES, BILATERAL (ICD-715.96) Removed problem of OSTEOARTHRITIS, LOWER LEG (ICD-715.96) Observations: Added new observation of PAST MED HX: `brain cyst` patellofemoral DJD supartz injection left knee Anxiety Disorder Arthritis Depression GERD Hypertension Kidney Stones Obesity Mild venous insufficiency Echo 2011- EF 55-60% 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

## 2010-06-05 NOTE — Assessment & Plan Note (Signed)
Summary: fu/kh   Vital Signs:  Patient profile:   40 year old female Height:      66.5 inches Weight:      393.2 pounds BMI:     62.74 Pulse rate:   92 / minute BP sitting:   130 / 80  (left arm)  Vitals Entered By: Arlyss Repress CMA, (October 07, 2009 9:28 AM) CC: f/up labs and meds Is Patient Diabetic? No Pain Assessment Patient in pain? no        Primary Care Provider:  Milinda Antis MD  CC:  f/up labs and meds.  History of Present Illness:   Pt here to follow-up on meds and labs  1. CBC showed thrombocytosis, with slightly elevated WBC will repeat today  2. Anxiety- improved with use of Benzo, per report has only needed 3 pills so far. Continues to take Zoloft  3. Migraines- started on Nortyptiline at bedtime , reports 2 headaches since beginning, she tried Imitrex, initially it made her heart race, but she stated this lasted only a few minutes. Headache was dissipated with this. Has taken it again without any side effects  4. Persistant knee pain- using TENS unit per Ortho. Plans to return for repeat injection, concerned swelling on left knee as well  Habits & Providers  Alcohol-Tobacco-Diet     Tobacco Status: never  Current Medications (verified): 1)  Fioricet 50-325-40 Mg Tabs (Butalbital-Apap-Caffeine) .... Take 1 Tablet By Mouth Once A Day 2)  Lisinopril 20 Mg Tabs (Lisinopril) .Marland Kitchen.. 1 Tab By Mouth Daily 3)  Omeprazole 40 Mg  Cpdr (Omeprazole) .Marland Kitchen.. 1 Tab By Mouth Daily 4)  Gabapentin 400 Mg Caps (Gabapentin) .... One Tab By Mouth Tid 5)  Sertraline Hcl 100 Mg Tabs (Sertraline Hcl) .... Take 1 1/2 Tablet Daily 6)  Allegra 180 Mg Tabs (Fexofenadine Hcl) .Marland Kitchen.. 1 By Mouth Daily As Needed Allergy 7)  Promethazine Hcl 25 Mg Tabs (Promethazine Hcl) .... One Tab By Mouth Q6 Hours As Needed For Nausea/vomiting 8)  Nortriptyline Hcl 25 Mg Caps (Nortriptyline Hcl) .Marland Kitchen.. 1 By Mouth At Bedtime For Migraines 9)  Imitrex 50 Mg Tabs (Sumatriptan Succinate) .Marland Kitchen.. 1 By Mouth At  Onset of Headache May Repeat in 2 Hours 10)  Alprazolam 0.5 Mg Tabs (Alprazolam) .Marland Kitchen.. 1 By Mouth Two Times A Day As Needed Anxiety  Allergies (verified): 1)  ! Codeine  Physical Exam  General:  Vitals reviewed. Morbidly obese, NAD Msk:  Left knee- mild effusion, fullness in popliteal and lateral to patella. Pain along the joint line. Ligaments stable, Strength equal bilat in lower ext Psych:  Oriented X3, memory intact for recent and remote, good eye contact, not anxious appearing, and not depressed appearing.     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 Take 1 tablet by mouth once a day    Imitrex 50 Mg Tabs (Sumatriptan succinate) .Marland Kitchen... 1 by mouth at onset of headache may repeat in 2 hours  Orders: [facility]- Est  Level 4 ([address])  Problem # 4:  KNEE PAIN,  BILATERAL (ICD-719.46) Assessment: Unchanged  Return to ortho for injection, continue TENS unit Her updated medication list for this problem includes:    Fioricet 50-325-40 Mg Tabs (Butalbital-apap-caffeine) .Marland Kitchen... Take 1 tablet by mouth once a day  Orders: [facility]- Est  Level 4 ([address])  Complete Medication List: 1)  Fioricet 50-325-40 Mg Tabs (Butalbital-apap-caffeine) .... Take 1 tablet by mouth once a day 2)  Lisinopril 20 Mg Tabs (Lisinopril) .Marland Kitchen.. 1 tab by mouth daily 3)  Omeprazole 40 Mg Cpdr (Omeprazole) .Marland Kitchen.. 1 tab by mouth daily 4)  Gabapentin 400 Mg Caps (Gabapentin) .... One tab by mouth tid 5)   Sertraline Hcl 100 Mg Tabs (Sertraline hcl) .... Take 1 1/2 tablet daily 6)  Allegra 180 Mg Tabs (Fexofenadine hcl) .Marland Kitchen.. 1 by mouth daily as needed allergy 7)  Promethazine Hcl 25 Mg Tabs (Promethazine hcl) .... One tab by mouth q6 hours as needed for nausea/vomiting 8)  Nortriptyline Hcl 25 Mg Caps (Nortriptyline hcl) .Marland Kitchen.. 1 by mouth at bedtime for migraines 9)  Imitrex 50 Mg Tabs (Sumatriptan succinate) .Marland Kitchen.. 1 by mouth at onset of headache may repeat in 2 hours 10)  Alprazolam 0.5 Mg Tabs (Alprazolam) .Marland Kitchen.. 1 by mouth two times a day as needed anxiety  Other Orders: TSH-[facility] ([address])  Patient Instructions: 1)  For your headaches- continue the Nortyptiline daily 2)  For your blood pressure continue the  lisinopril 3)  I will check your complete blood count and thyroid 4)  Follow-up with Dr Jorge Mandril for your Knee Pain 5)  Your next visit in 2 months  6)  Make an appt with Danie Chandler- bring a 24 hour recall of everything you eat

## 2010-06-05 NOTE — Progress Notes (Signed)
Summary: Emergency Line Call  Phone Note Call from Patient Call back at [phone]   Caller: Patient Summary of Call: Calling regarding headache, left side of face and neck, feels worse than usual migraines.  Speaking full and clear sentences, no visual changes, no numbness/weakness but does have some L leg pain.  Took imitrex x 2 already.  No N/V, Advised not to take any more imitrex, go ahead and take motrin 800mg  q6h for headache and call [facility] on Monday for SDA re: migraine control.  She should call back if develops intractable N/V and cannot eat.  Pt agreeable and appreciative. Initial call taken by: Rodney Langton MD,  October 26, 2009 10:42 AM

## 2010-06-05 NOTE — Assessment & Plan Note (Signed)
Summary: READ PPD/TABORI/[facility]  Nurse Visit    Prior Medications: AMITRIPTYLINE HCL 75 MG TABS (AMITRIPTYLINE HCL) Take 1 tablet by mouth at bedtime FIORICET 50-325-40 MG TABS (BUTALBITAL-APAP-CAFFEINE) Take 1 tablet by mouth once a day TRAMADOL HCL 50 MG TABS (TRAMADOL HCL) Take 1 tablet by mouth three times a day ZYRTEC HIVES RELIEF 10 MG TABS (CETIRIZINE HCL) Take 1 tablet by mouth once a day LISINOPRIL 20 MG TABS (LISINOPRIL) 1 tab by mouth daily- needs appt w/ MD prior to additional refills IBUPROFEN 800 MG  TABS (IBUPROFEN) 1 tab by mouth three times a day as needed for pain Current Allergies: ! CODEINE   PPD Results    Date of reading: 09/12/2007    Results: < 5mm    Interpretation: negative Sep 12, 2007 8:27 AM ..................Marland KitchenDelores Pate-Gaddy, CMA (AAMA)      ]  Appended Document: Orders Update    Clinical Lists Changes  Orders: Added new Service order of No Charge Patient Arrived (NCPA0) (NCPA0) - Signed

## 2010-06-05 NOTE — Progress Notes (Signed)
Summary: triage  Phone Note Call from Patient Call back at Home Phone [phone]   Caller: Patient Summary of Call: Pt having shortness of breath. Initial call taken by: Clydell Hakim,  December 06, 2009 1:49 PM  Follow-up for Phone Call        patient reports shortness of breath started today. advised her to come to office now. Follow-up by: Theresia Lo RN,  December 06, 2009 2:09 PM

## 2010-06-05 NOTE — Progress Notes (Signed)
Summary: Triage  Phone Note Call from Patient Call back at [phone]   Summary of Call: C/o HA's that shoot pain down her neck, her MD is booked until next week and she states that is too long to wait.  Would like to be worked in with another MD. Initial call taken by: Haydee Salter,  April 10, 2008 2:59 PM  Follow-up for Phone Call        no answer Follow-up by: Golden Circle RN,  April 10, 2008 3:27 PM  Additional Follow-up for Phone Call Additional follow up Details #1::        left message with man who answered the phone, asking her to call us back Additional Follow-up by: Golden Circle RN,  April 11, 2008 8:27 AM

## 2010-06-05 NOTE — Progress Notes (Signed)
  Phone Note Outgoing Call   Call placed by: Milinda Antis MD,  October 14, 2009 8:40 AM Details for Reason: Lab results Summary of Call: Discussed lab results and smear, appear to be reactive thrombocytosis secondary to iron def. Pt to see me in 6 weeks, will obtain ferrtin, TIBC and restart iron at that time. Pt voiced understanding

## 2010-06-05 NOTE — Procedures (Signed)
Summary: Instructions for procedure/[place]  Instructions for procedure/[place]   Imported By: Sherian Rein 10/22/2009 09:51:03  _____________________________________________________________________  External Attachment:    Type:   Image     Comment:   External Document

## 2010-06-05 NOTE — Letter (Signed)
Summary: Handout Printed  Printed Handout:  - Family Medicine Patient Instructions 

## 2010-06-05 NOTE — Letter (Signed)
Summary: Lab-Female  All     ,     Phone:   Fax:     09/17/2009        [facility] [address]  [place], [place]  [address]   Dear [name]:  We have carefully reviewed the results of your tests noted below and the results are:  PREVENTIVE CARE TESTING:    PAP smear: NEGATIVE FOR INTRAEPITHELIAL LESIONS OR MALIGNANCY. on 09/12/2009     Other Lab work:Your Basic metabolic panel was normal this looks at your kidney function and your electrolytes. Your complete blood count was abnormal, your hemoglobin is slightly low and your platelets which help you clot are higher than normal. I would like to repeat this blood test at your next visit in June.      Special recommendations: Please follow-up for your appt to have your blood rechecked.   If you have any questions, please call. We appreciate being able to work with you.   Sincerely,   Milinda Antis MD Typed by: Milinda Antis MD  Appended Document: Lab-Female mailed.

## 2010-06-05 NOTE — Miscellaneous (Signed)
Summary: prior auth for omeprazole  Clinical Lists Changes pa for omeprazole that was ordered today started. to provider to finish & sign.Golden Circle RN  August 29, 2008 3:34 PM  Only the 2nd page was printed of the prior auth. Need the first one. Lupita Raider MD  August 30, 2008 10:15 AM  give me back page 1 & I can find page2.Golden Circle RN  August 30, 2008 2:04 PM  page one is in your chart box.Golden Circle RN  August 31, 2008 10:12 AM

## 2010-06-05 NOTE — Assessment & Plan Note (Signed)
Summary: TB TEST/KH  Nurse Visit     Allergies: 1)  ! Codeine    Immunizations Administered:  PPD Skin Test:    Vaccine Type: PPD    Site: left forearm    Mfr: Sanofi Pasteur    Dose: 0.1 ml    Route: ID    Given by: Jacki Cones RN    Exp. Date: 04/19/2011    Lot #: [id number] PPD placed at 10am. Jacki Cones RN  Sep 11, 2008 11:03 AM   Orders Added: 1)  TB Skin Test [86580] 2)  Est Level 1- [facility] [82956]

## 2010-06-05 NOTE — Consult Note (Signed)
Summary: [facility]   Imported By: Clydell Hakim 10/09/2008 15:19:09  _____________________________________________________________________  External Attachment:    Type:   Image     Comment:   External Document

## 2010-06-05 NOTE — Progress Notes (Signed)
Summary: Rx Prob  Phone Note Call from Patient Call back at [facility] Phone [phone]   Caller: Patient Summary of Call: The medication that her ins will pay for is Topiramete and they will pay for Allegra genric brand Sexosenadine.  Pt uses [employer]. Initial call taken by: Clydell Hakim,  March 20, 2009 4:55 PM  Follow-up for Phone Call        to pcp Follow-up by: Golden Circle RN,  March 20, 2009 5:01 PM  Additional Follow-up for Phone Call Additional follow up Details #1::        I have sent Allegra and Topmax to pharmacy already Additional Follow-up by: Milinda Antis MD,  March 20, 2009 5:55 PM

## 2010-06-05 NOTE — Progress Notes (Signed)
Summary: Rx Req  Phone Note Call from Patient Call back at Home Phone [phone]   Caller: Patient Summary of Call: pt says that [place] and that the ins co says that if Dr. Jeanice Lim calls [phone] that they would approve the medication.   Initial call taken by: Clydell Hakim,  March 18, 2009 4:05 PM  Follow-up for Phone Call        They do not have the prior authorization, I will send a refill and ask the pharmacy regarding prior auth or patient can call herself Follow-up by: Milinda Antis MD,  March 20, 2009 8:48 AM

## 2010-06-05 NOTE — Assessment & Plan Note (Signed)
Summary: congestion/cough,df   Vital Signs:  Patient profile:   40 year old female Height:      66.5 inches Weight:      381.7 pounds BMI:     60.90 Temp:     97.1 degrees F oral Pulse rate:   72 / minute BP sitting:   130 / 67  (right arm)  Vitals Entered By: Arlyss Repress CMA, (March 18, 2009 9:30 AM) CC: cough and congestion x 3 weeks. refill allergie meds and discuss change of migraine meds. Is Patient Diabetic? No Pain Assessment Patient in pain? yes     Location: legs Intensity: 7 Onset of pain  Chronic   Primary Care Provider:  Milinda Antis MD  CC:  cough and congestion x 3 weeks. refill allergie meds and discuss change of migraine meds..  History of Present Illness:    40 y.o. morbidly obese pt with history of HTN, Depression, DJD, Migraine HA presents with cough and congestion x 3 weeks  Cough non productive, associated with chills, no fever. Denies sore throat, states with coughing at night feels short of breath. No known sick contacts, has tried OTC cough medications with little help. 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 Has multiple headaches a month now without meds  Habits & Providers  Alcohol-Tobacco-Diet     Tobacco Status: never  Current Medications (verified): 1)  Fioricet 50-325-40 Mg Tabs (Butalbital-Apap-Caffeine) .... Take 1 Tablet By Mouth Once A Day 2)  Lisinopril 20 Mg Tabs (Lisinopril) .Marland Kitchen.. 1 Tab By Mouth Daily 3)  Omeprazole 40 Mg  Cpdr (Omeprazole) .Marland Kitchen.. 1 Tab By Mouth Daily 4)  Neurontin 300 Mg Caps (Gabapentin) .... Three At Bedtime (Dr Jorge Mandril) 5)  Mobic 7.5 Mg Tabs (Meloxicam) .... One Daily (Per Ortho) 6)  Sertraline Hcl 100 Mg Tabs (Sertraline Hcl) .... Take 1 1/2 Tablet Daily 7)  Meclizine Hcl 25 Mg Chew (Meclizine Hcl)  .... Take One By Mouth Three Times A Day As Needed Dizziness 8)  Tessalon Perles 100 Mg Caps (Benzonatate) .Marland Kitchen.. 1 By Mouth Three Times A Day As Needed Cough 9)  Allegra 180 Mg Tabs (Fexofenadine Hcl) .Marland Kitchen.. 1 By Mouth Daily As Needed Allergy 10)  Topamax 25 Mg Tabs (Topiramate) .Marland Kitchen.. 1 By Mouth At Bedtime X 1 Week, Then Increase To 2 Tabs By Mouth Daily  Allergies (verified): 1)  ! Codeine  Physical Exam  General:  Vitals reviewed. Morbidly obese, NAD Ears:  External ear exam shows no significant lesions or deformities.  Otoscopic examination reveals clear canals, tympanic membranes are intact bilaterally without bulging, retraction, inflammation or discharge. Hearing is grossly normal bilaterally. Nose:  no external deformity and no nasal discharge.   Mouth:  MMM, no exudates or lesions, no erythema Lungs:   Lungs are clear to auscultation, no crackles or wheezes. normal WOB Heart:  Normal rate and regular rhythm. S1 and S2 normal without  murmur   Impression & Recommendations:  Problem # 1:  URI (ICD-465.9) Assessment New Supportive care The following medications were removed from the medication list:    Zyrtec Hives Relief 10 Mg Tabs (Cetirizine hcl) .Marland Kitchen... Take 1 tablet by mouth once a day Her updated medication list for this problem includes:    Mobic 7.5 Mg Tabs (Meloxicam) ..... One daily (per ortho)  Tessalon Perles 100 Mg Caps (Benzonatate) .Marland Kitchen... 1 by mouth three times a day as needed cough    Allegra 180 Mg Tabs (Fexofenadine hcl) .Marland Kitchen... 1 by mouth daily as needed allergy  Orders: [facility]- Est Level  3 ([address])  Problem # 2:  HEADACHE, UNSPECIFIED (ICD-784.0) Assessment: Unchanged Persistant migraines, medication not covered by Insurance therefore will change to Topamax unless authorization is approved. Her updated medication list for this problem includes:    Fioricet 50-325-40 Mg Tabs (Butalbital-apap-caffeine) .Marland Kitchen... Take 1 tablet by mouth once a day    Mobic 7.5 Mg Tabs  (Meloxicam) ..... One daily (per ortho)  Orders: [facility]- Est Level  3 (60[address])  Complete Medication List: 1)  Fioricet 50-325-40 Mg Tabs (Butalbital-apap-caffeine) .... Take 1 tablet by mouth once a day 2)  Lisinopril 20 Mg Tabs (Lisinopril) .Marland Kitchen.. 1 tab by mouth daily 3)  Omeprazole 40 Mg Cpdr (Omeprazole) .Marland Kitchen.. 1 tab by mouth daily 4)  Neurontin 300 Mg Caps (Gabapentin) .... Three at bedtime (dr Jorge Mandril) 5)  Mobic 7.5 Mg Tabs (Meloxicam) .... One daily (per ortho) 6)  Sertraline Hcl 100 Mg Tabs (Sertraline hcl) .... Take 1 1/2 tablet daily 7)  Meclizine Hcl 25 Mg Chew (Meclizine hcl) .... Take one by mouth three times a day as needed dizziness 8)  Tessalon Perles 100 Mg Caps (Benzonatate) .Marland Kitchen.. 1 by mouth three times a day as needed cough 9)  Allegra 180 Mg Tabs (Fexofenadine hcl) .Marland Kitchen.. 1 by mouth daily as needed allergy 10)  Topamax 25 Mg Tabs (Topiramate) .Marland Kitchen.. 1 by mouth at bedtime x 1 week, then increase to 2 tabs by mouth daily  Other Orders: Influenza Vaccine [facility] ([address]11)  Patient Instructions: 1)  Please make an appointment with Danie Chandler, PhD at the front desk 2)  For your cold use the cough medicine and you can use Sudafed over the counter as needed 3)  I will call about  your medications Prescriptions: TOPAMAX 25 MG TABS (TOPIRAMATE) 1 by mouth at bedtime x 1 week, then increase to 2 tabs by mouth daily  #60 x 1   Entered and Authorized by:   Milinda Antis MD   Signed by:   Milinda Antis MD on 03/18/2009   Method used:   Electronically to        [facility] [id number] (retail)       [address] [place], [place]  [address]       Ph: [phone]       Fax: [phone]   RxID:   [id number] ALLEGRA 180 MG TABS (FEXOFENADINE HCL) 1 by mouth Daily as needed allergy  #30 x 6   Entered and Authorized by:   Milinda Antis MD   Signed by:   Milinda Antis MD on 03/18/2009   Method used:   Electronically to        [facility] [id number]  (retail)       [address] [place], [place]  [address]       Ph: [phone]       Fax: [phone]   RxID:   [id number] TESSALON PERLES 100 MG CAPS (BENZONATATE) 1 by mouth three times a day as needed cough  #20 x 0   Entered and Authorized by:   Milinda Antis MD   Signed by:  Milinda Antis MD on 03/18/2009   Method used:   Electronically to        [facility] [id number] (retail)       [address] [place], [place]  [address]       Ph: [phone]       Fax: [phone]   RxID:   [id number]    Influenza Vaccine    Vaccine Type: Fluvax MCR    Site: left deltoid    Mfr: GlaxoSmithKline    Dose: 0.5 ml    Route: IM    Given by: Arlyss Repress CMA,    Exp. Date: 10/31/2009    Lot #: [id number]    VIS given: 11/25/06 version given March 18, 2009.  Flu Vaccine Consent Questions    Do you have a history of severe allergic reactions to this vaccine? no    Any prior history of allergic reactions to egg and/or gelatin? no    Do you have a sensitivity to the preservative Thimersol? no    Do you have a past history of Guillan-Barre Syndrome? no    Do you currently have an acute febrile illness? no    Have you ever had a severe reaction to latex? no    Vaccine information given and explained to patient? yes    Are you currently pregnant? no  [employer] Bus paperwork filled out

## 2010-06-05 NOTE — Letter (Signed)
Summary: [facility] Instructions  [facility]  [address] [place], [place] [address]   Phone: [phone]  Fax: [phone]       Crystal Garza    1971/02/19    MRN: [id number]        Procedure Day /Date:TUESDAY 11/12/2009     Arrival Time:8:30AM     Procedure Time:9:30AM     Location of Procedure:                     X   [facility] ( Outpatient Registration)                        PREPARATION FOR COLONOSCOPY WITH MOVIPREP   Starting 5 days prior to your procedure 7/7/2011do not eat nuts, seeds, popcorn, corn, beans, peas,  salads, or any raw vegetables.  Do not take any fiber supplements (e.g. Metamucil, Citrucel, and Benefiber).  THE DAY BEFORE YOUR PROCEDURE         DATE: 11/11/2009 DAY: MONDAY  1.  Drink clear liquids the entire day-NO SOLID FOOD  2.  Do not drink anything colored red or purple.  Avoid juices with pulp.  No orange juice.  3.  Drink at least 64 oz. (8 glasses) of fluid/clear liquids during the day to prevent dehydration and help the prep work efficiently.  CLEAR LIQUIDS INCLUDE: Water Jello Ice Popsicles Tea (sugar ok, no milk/cream) Powdered fruit flavored drinks Coffee (sugar ok, no milk/cream) Gatorade Juice: apple, white grape, white cranberry  Lemonade Clear bullion, consomm, broth Carbonated beverages (any kind) Strained chicken noodle soup Hard Candy                             4.  In the morning, mix first dose of MoviPrep solution:    Empty 1 Pouch A and 1 Pouch B into the disposable container    Add lukewarm drinking water to the top line of the container. Mix to dissolve    Refrigerate (mixed solution should be used within 24 hrs)  5.  Begin drinking the prep at 5:00 p.m. The MoviPrep container is divided by 4 marks.   Every 15 minutes drink the solution down to the next mark (approximately 8 oz) until the full liter is complete.   6.  Follow completed prep with 16 oz of clear liquid of your choice  (Nothing red or purple).  Continue to drink clear liquids until bedtime.  7.  Before going to bed, mix second dose of MoviPrep solution:    Empty 1 Pouch A and 1 Pouch B into the disposable container    Add lukewarm drinking water to the top line of the container. Mix to dissolve    Refrigerate  THE DAY OF YOUR PROCEDURE      DATE: 11/12/2009 ONG:EXBMWUX  Beginning at 4:30AMa.m. (5 hours before procedure):         1. Every 15 minutes, drink the solution down to the next mark (approx 8 oz) until the full liter is complete.  2. Follow completed prep with 16 oz. of clear liquid of your choice.    3. You may drink clear liquids until4:30AM (4-HOURS BEFORE PROCEDURE).   MEDICATION INSTRUCTIONS  Unless otherwise instructed, you should take regular prescription medications with a small sip of water   as early as possible the morning of your procedure.  OTHER INSTRUCTIONS  You will need a responsible adult at least 42 years of age to accompany you and drive you home.   This person must remain in the waiting room during your procedure.  Wear loose fitting clothing that is easily removed.  Leave jewelry and other valuables at home.  However, you may wish to bring a book to read or  an iPod/MP3 player to listen to music as you wait for your procedure to start.  Remove all body piercing jewelry and leave at home.  Total time from sign-in until discharge is approximately 2-3 hours.  You should go home directly after your procedure and rest.  You can resume normal activities the  day after your procedure.  The day of your procedure you should not:   Drive   Make legal decisions   Operate machinery   Drink alcohol   Return to work  You will receive specific instructions about eating, activities and medications before you leave.    The above instructions have been reviewed and explained to me by   _______________________    I fully understand and can  verbalize these instructions _____________________________ Date _________

## 2010-06-05 NOTE — Assessment & Plan Note (Signed)
Summary: tender area on head & cysts   Vital Signs:  Patient Profile:   40 Years Old Female Height:     66.5 inches Weight:      385.2 pounds Temp:     97.0 degrees F BP sitting:   110 / 80  (left arm)  Vitals Entered By: Starleen Blue RN (May 21, 2008 2:13 PM)                 Visit Type:  acute visit PCP:  Milinda Antis MD  Chief Complaint:  top head hurt.  History of Present Illness: patient is 40 y/o female where complaining of pain at the top of her scalp. states she has a history of a cyst on her scalp that was evaluated by neurosurgery. patient is concerned about current pain on her head 2/2 h/o brain cancer in her father. pain began several days ago. localized to central portion of scalp. reports feeling a bump there. no drainage.     Current Allergies: ! CODEINE      Physical Exam  General:     obese, NAD, comfortable appearing sitting. Head:     normocephalic, atraumatic, no abnormalities observed, no abnormalities palpated, and no alopecia.  no obvious lesions or deformities. no palpable masses.  Neurologic:     alert & oriented X3, cranial nerves II-XII intact, strength normal in all extremities, sensation intact to light touch, sensation intact to pinprick, and gait normal.   Skin:     Intact without suspicious lesions or rashes Psych:     mildly anxious.     Impression & Recommendations:  Problem # 1:  SWELLING MASS OR LUMP IN HEAD AND NECK (ICD-784.2) Assessment: Comment Only no lesions observed on exam. 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 only interested in re-referral to neurosurg, despite the absence of any red flags for malignancy. will refer at patient request.  Orders: [facility]- Est Level  2 ([id number])   Complete Medication List: 1)  Amitriptyline Hcl 75 Mg Tabs (Amitriptyline hcl) .... Take 1 tablet by mouth at bedtime 2)  Fioricet 50-325-40 Mg Tabs  (Butalbital-apap-caffeine) .... Take 1 tablet by mouth once a day 3)  Tramadol Hcl 50 Mg Tabs (Tramadol hcl) .... Take 1 tablet by mouth three times a day 4)  Zyrtec Hives Relief 10 Mg Tabs (Cetirizine hcl) .... Take 1 tablet by mouth once a day 5)  Lisinopril 20 Mg Tabs (Lisinopril) .Marland Kitchen.. 1 tab by mouth daily- needs appt w/ md prior to additional refills 6)  Ibuprofen 800 Mg Tabs (Ibuprofen) .Marland Kitchen.. 1 tab by mouth three times a day as needed for pain 7)  Omeprazole 40 Mg Cpdr (Omeprazole) .Marland Kitchen.. 1 tab by mouth daily 8)  Wellbutrin Sr 150 Mg Tb12 (Bupropion hcl) .Marland Kitchen.. 1 tab by mouth qam x 7 days and then 1 tab two times a day.  Other Orders: Neurosurgeon Referral Psychologist, educational)

## 2010-06-13 ENCOUNTER — Other Ambulatory Visit: Payer: Self-pay | Admitting: Family Medicine

## 2010-06-13 DIAGNOSIS — F419 Anxiety disorder, unspecified: Secondary | ICD-10-CM

## 2010-06-13 NOTE — Telephone Encounter (Signed)
Refill request

## 2010-06-13 NOTE — Telephone Encounter (Signed)
Done, called in.

## 2010-06-27 ENCOUNTER — Encounter: Payer: Self-pay | Admitting: Family Medicine

## 2010-06-27 ENCOUNTER — Ambulatory Visit (INDEPENDENT_AMBULATORY_CARE_PROVIDER_SITE_OTHER): Payer: Medicare Other | Admitting: Family Medicine

## 2010-06-27 ENCOUNTER — Other Ambulatory Visit: Payer: Self-pay | Admitting: Urology

## 2010-06-27 VITALS — BP 129/76 | HR 100 | Ht 67.0 in | Wt 378.0 lb

## 2010-06-27 DIAGNOSIS — F419 Anxiety disorder, unspecified: Secondary | ICD-10-CM

## 2010-06-27 DIAGNOSIS — I10 Essential (primary) hypertension: Secondary | ICD-10-CM

## 2010-06-27 DIAGNOSIS — F411 Generalized anxiety disorder: Secondary | ICD-10-CM

## 2010-06-27 DIAGNOSIS — J069 Acute upper respiratory infection, unspecified: Secondary | ICD-10-CM

## 2010-06-27 DIAGNOSIS — D509 Iron deficiency anemia, unspecified: Secondary | ICD-10-CM | POA: Insufficient documentation

## 2010-06-27 DIAGNOSIS — F413 Other mixed anxiety disorders: Secondary | ICD-10-CM | POA: Insufficient documentation

## 2010-06-27 MED ORDER — ALPRAZOLAM 0.5 MG PO TABS: 1.0000 mg | ORAL_TABLET | Freq: Two times a day (BID) | ORAL | Status: DC | PRN

## 2010-06-27 MED ORDER — HYDROCODONE-ACETAMINOPHEN 5-500 MG PO TABS: 1.0000 | ORAL_TABLET | Freq: Three times a day (TID) | ORAL | Status: DC | PRN

## 2010-06-27 NOTE — Patient Instructions (Signed)
You have a virus, use nasal saline for your nose congestion  Increase your xanax to 1mg  BID -- You can take 2 tablets of your 0.5mg  tabs  We will call with labs results

## 2010-06-27 NOTE — Assessment & Plan Note (Signed)
Increase Xanax to 1mg  BID Continue Zoloft

## 2010-06-27 NOTE — Progress Notes (Signed)
  Subjective:    Patient ID: Crystal Garza, female    DOB: [birth year], 40 y.o.   MRN: [id number]  HPI URI x 1 week- mostly runny nose, occ sneezing,  ears popping, no cough,  no fever, no vomiting, no diarrhea , no fever, no sick contacts noticed  mouth dry, thirsty at night, urinating a lot at night -concerned about diabetes   Anxiety- more anxious now, her teenage daughter is pregnant, she is currently caring for her sons daughter who is now 5, she did not expect her daughter to get pregnant. Recently had many family members die and her close friend passed a month ago. Taking Zoloft, does not have many bad or sad days, feels better when she exercises and notes she is down 15pounds. No SI  . Feels anxiety meds wear off too early, would like dose changed   Due for lab work for anemia  Review of Systems per Above     Objective:   Physical Exam GEN- morbidly obese, NAD, pleasant HEENT- PERRL, non icteric, clear sclera, TM clear bilat, oropharynx clear, no cervical LAD, MMM, nares-clear rhinorhea, no pain at TMJ, mild maxillary sinus pressure, no frontal pressure CVS-RRR,  RESP- CTAB EXT- no edema  PSYCH- not depressed or anxious appearing, affect very appropriate,good spirits        Assessment & Plan:

## 2010-06-27 NOTE — Assessment & Plan Note (Signed)
Repeat iron studies

## 2010-06-27 NOTE — Assessment & Plan Note (Signed)
Improving, pt motivated to work out- down 15lbs

## 2010-06-27 NOTE — Assessment & Plan Note (Signed)
Supportive care, nasal saline, viral illness, no antibiotics needed

## 2010-06-28 LAB — CONVERTED CEMR LAB
BUN: 11 mg/dL (ref 6–23)
CO2: 25 meq/L (ref 19–32)
Calcium: 9.7 mg/dL (ref 8.4–10.5)
Chloride: 100 meq/L (ref 96–112)
Creatinine, Ser: 0.79 mg/dL (ref 0.40–1.20)
Ferritin: 142 ng/mL (ref 10–291)
Glucose, Bld: 83 mg/dL (ref 70–99)
HCT: 38.2 % (ref 36.0–46.0)
Hemoglobin: 11.6 g/dL — ABNORMAL LOW (ref 12.0–15.0)
Iron: 34 ug/dL — ABNORMAL LOW (ref 42–145)
MCHC: 30.4 g/dL (ref 30.0–36.0)
MCV: 81.8 fL (ref 78.0–100.0)
Platelets: 569 10*3/uL — ABNORMAL HIGH (ref 150–400)
Potassium: 4 meq/L (ref 3.5–5.3)
RBC: 4.67 M/uL (ref 3.87–5.11)
RDW: 16 % — ABNORMAL HIGH (ref 11.5–15.5)
Saturation Ratios: 9 % — ABNORMAL LOW (ref 20–55)
Sodium: 138 meq/L (ref 135–145)
TIBC: 360 ug/dL (ref 250–470)
UIBC: 326 ug/dL
WBC: 13.1 10*3/uL — ABNORMAL HIGH (ref 4.0–10.5)

## 2010-06-28 LAB — CBC
HCT: 38.2 % (ref 36.0–46.0)
Hemoglobin: 11.6 g/dL — ABNORMAL LOW (ref 12.0–15.0)
MCH: 24.8 pg — ABNORMAL LOW (ref 26.0–34.0)
MCHC: 30.4 g/dL (ref 30.0–36.0)
MCV: 81.8 fL (ref 78.0–100.0)
Platelets: 569 10*3/uL — ABNORMAL HIGH (ref 150–400)
RBC: 4.67 MIL/uL (ref 3.87–5.11)
RDW: 16 % — ABNORMAL HIGH (ref 11.5–15.5)
WBC: 13.1 10*3/uL — ABNORMAL HIGH (ref 4.0–10.5)

## 2010-06-28 LAB — IRON AND TIBC
%SAT: 9 % — ABNORMAL LOW (ref 20–55)
Iron: 34 ug/dL — ABNORMAL LOW (ref 42–145)
TIBC: 360 ug/dL (ref 250–470)
UIBC: 326 ug/dL

## 2010-06-28 LAB — BASIC METABOLIC PANEL
BUN: 11 mg/dL (ref 6–23)
CO2: 25 mEq/L (ref 19–32)
Calcium: 9.7 mg/dL (ref 8.4–10.5)
Chloride: 100 mEq/L (ref 96–112)
Creat: 0.79 mg/dL (ref 0.40–1.20)
Glucose, Bld: 83 mg/dL (ref 70–99)
Potassium: 4 mEq/L (ref 3.5–5.3)
Sodium: 138 mEq/L (ref 135–145)

## 2010-06-28 LAB — FERRITIN: Ferritin: 142 ng/mL (ref 10–291)

## 2010-07-03 ENCOUNTER — Telehealth: Payer: Self-pay | Admitting: Family Medicine

## 2010-07-03 MED ORDER — FERROUS SULFATE 325 (65 FE) MG PO TABS: 325.0000 mg | ORAL_TABLET | Freq: Three times a day (TID) | ORAL | Status: DC

## 2010-07-03 NOTE — Telephone Encounter (Signed)
Discussed labs with patient , will need to restart iron at 325mg  TID Random glucose normal and kidney function normal Script sent to pharmacy Note labs appear to have been signed off by another physician, will rectify this with our lab

## 2010-07-08 ENCOUNTER — Telehealth: Payer: Self-pay | Admitting: Family Medicine

## 2010-07-08 NOTE — Telephone Encounter (Signed)
Spray not working and would like an abx called into [facility]

## 2010-07-09 NOTE — Telephone Encounter (Addendum)
LVM, if pt can give symptoms she still has from our previous visit- at that time had viral URI   Called pt again 07/10/10 1:10pm, no answer, she can come in to be seen as work in if needed. But I would have to speak to her prior to giving any antibiotics by phone

## 2010-07-11 ENCOUNTER — Telehealth: Payer: Self-pay | Admitting: Family Medicine

## 2010-07-11 MED ORDER — AMOXICILLIN-POT CLAVULANATE 875-125 MG PO TABS: 1.0000 | ORAL_TABLET | Freq: Two times a day (BID) | ORAL | Status: AC

## 2010-07-11 NOTE — Telephone Encounter (Signed)
Pt was at visit with daughter today, continues to have nasal drainage with bilateral sinus pressure, now for 2 weeks since our last visit, causes her to have migraines, using nasal spray without help, no fever, has pain over maxillary and frontal region  Will treat for sinusitis, based on time length as explained at previous visit

## 2010-07-24 ENCOUNTER — Ambulatory Visit (INDEPENDENT_AMBULATORY_CARE_PROVIDER_SITE_OTHER): Payer: Medicare Other | Admitting: Family Medicine

## 2010-07-24 ENCOUNTER — Encounter: Payer: Self-pay | Admitting: Family Medicine

## 2010-07-24 VITALS — BP 136/90 | Temp 97.6°F | Ht 67.0 in | Wt 377.0 lb

## 2010-07-24 DIAGNOSIS — J019 Acute sinusitis, unspecified: Secondary | ICD-10-CM

## 2010-07-24 MED ORDER — CYCLOBENZAPRINE HCL 10 MG PO TABS: 10.0000 mg | ORAL_TABLET | Freq: Two times a day (BID) | ORAL | Status: DC | PRN

## 2010-07-24 MED ORDER — SULFAMETHOXAZOLE-TMP DS 800-160 MG PO TABS: 1.0000 | ORAL_TABLET | Freq: Two times a day (BID) | ORAL | Status: AC

## 2010-07-24 MED ORDER — KETOROLAC TROMETHAMINE 60 MG/2ML IM SOLN
60.0000 mg | Freq: Once | INTRAMUSCULAR | Status: AC
  Administered 2010-07-24: 60 mg via INTRAMUSCULAR

## 2010-07-24 NOTE — Assessment & Plan Note (Signed)
Pt has tenderness over sinuses, HA worsens when she leans forward, she has had recent sinus infection. I think she still has the sinus infection and I plan to treat with abx. She should continue to take the Mobic for her headahces. Also, she was given a toradol shot today in clinic and instructed to go home and rest for the next 2 days.

## 2010-07-24 NOTE — Patient Instructions (Signed)
You appear to still have sinusitis. I will treat you with a different med.  You had a shot of Toradol for your headache while in the office.  Continue to use the Meloxicam for your headaches and I recommend taking the Flexeril for a few days and resting to get rid of the tension in your chest and neck muscles.  If you are still having headaches by Tuesday of next week give [name] a call or come in to be seen.  Try to rest over the next 2 days.

## 2010-07-24 NOTE — Progress Notes (Signed)
Headache: Pt has a headache that is only on the left side. It is has present since Sunday. She recently had a URI about 3 weeks ago and then developed a sinusitis which was treated with Augmentin just 10 or 11 days ago. She comes in now with a left sdied headache fr the last 5 days, it hurts when she bends her head down, she has no vision changes, no pain in her temples, no drainage, no tooth pain(b/c she has not upper teeth). She does have a h/o headaches but says that this feels different from her normal migraines and says they are under control most of the time. She has been taking Mobic, Vicodin, and Fiorcet as well as her normal nortriptyline. She can not seem to get this headache to go away. She does have some photophobia and phonophobia. She also has some tightness and pain in her neck and chest area. It is tender to the touch.   ROs: neg except as noted in HPi  PE:  Gen: Pt is sitting comfortably on the table. HEENT: tenderness with palpation over left frontal and maxillary sinuses. No eye changes, ears are normal bilaterally, TM's normal bilaterally, neck is tender to the touch (muscles sore). CV: RRR, no murmur Pulm: CTAB, no crackles or wheezes bilatearlly

## 2010-08-11 ENCOUNTER — Other Ambulatory Visit: Payer: Self-pay | Admitting: Family Medicine

## 2010-08-11 DIAGNOSIS — Z09 Encounter for follow-up examination after completed treatment for conditions other than malignant neoplasm: Secondary | ICD-10-CM

## 2010-08-11 DIAGNOSIS — Z1231 Encounter for screening mammogram for malignant neoplasm of breast: Secondary | ICD-10-CM

## 2010-09-08 ENCOUNTER — Inpatient Hospital Stay: Admission: RE | Admit: 2010-09-08 | Payer: Medicare Other | Source: Ambulatory Visit

## 2010-09-15 ENCOUNTER — Telehealth: Payer: Self-pay | Admitting: Family Medicine

## 2010-09-15 ENCOUNTER — Ambulatory Visit: Payer: Medicare Other | Admitting: Family Medicine

## 2010-09-15 NOTE — Telephone Encounter (Signed)
Patient asking if MD can send info to her on lapband?

## 2010-09-15 NOTE — Telephone Encounter (Signed)
Called patient to give information on where to go for information on the lap ban surgery, patient says she already went to the session and she wants to schedule appointment with Dr Jeanice Lim. [employer] her to call front desk and they will schedule her appointment.[place], Rodena Medin

## 2010-09-19 ENCOUNTER — Encounter: Payer: Self-pay | Admitting: Family Medicine

## 2010-09-19 ENCOUNTER — Ambulatory Visit (INDEPENDENT_AMBULATORY_CARE_PROVIDER_SITE_OTHER): Payer: Medicare Other | Admitting: Family Medicine

## 2010-09-19 VITALS — BP 158/84 | HR 114 | Temp 97.8°F | Ht 66.5 in | Wt 378.0 lb

## 2010-09-19 DIAGNOSIS — F419 Anxiety disorder, unspecified: Secondary | ICD-10-CM

## 2010-09-19 DIAGNOSIS — I10 Essential (primary) hypertension: Secondary | ICD-10-CM

## 2010-09-19 MED ORDER — FERROUS SULFATE 325 (65 FE) MG PO TABS: 325.0000 mg | ORAL_TABLET | Freq: Three times a day (TID) | ORAL | Status: DC

## 2010-09-19 MED ORDER — ALPRAZOLAM 0.5 MG PO TABS: 1.0000 mg | ORAL_TABLET | Freq: Two times a day (BID) | ORAL | Status: DC | PRN

## 2010-09-19 MED ORDER — PROMETHAZINE HCL 25 MG PO TABS: ORAL_TABLET | ORAL | Status: DC

## 2010-09-19 MED ORDER — LISINOPRIL 20 MG PO TABS: ORAL_TABLET | ORAL | Status: DC

## 2010-09-19 MED ORDER — HYDROCODONE-ACETAMINOPHEN 7.5-500 MG PO TABS: 1.0000 | ORAL_TABLET | Freq: Three times a day (TID) | ORAL | Status: DC | PRN

## 2010-09-19 NOTE — Patient Instructions (Signed)
Continue your walking program - try to go to the [employer] 3 days a week\  Try to bake your chicken/ fish   Limit yourself to 2 sweet teas a week   Next visit in 1 month

## 2010-09-19 NOTE — Progress Notes (Signed)
  Subjective:    Patient ID: Crystal Garza, female    DOB: [birth year], 40 y.o.   MRN: [id number]  HPI Pt had seminar for Lap band surgery May 8, she wants to pursue LAP Band- highest BMI 60, I reviewed weights back 4 years in our system- highest 393 approx 6 months ago- below are things she has tried in the past [employer] Physical therapy- for knees  Home program- walking  Followed with PCP for nutrition No OTC weight loss supplements  Weight watchers- unable to afford the food/products  Slim fast [employer]  Review of Systems     Objective:   Physical Exam     GEN- NAD, obese, alert and oriented, pt gets very tried after walking shorts distances- elevated pulse    Assessment & Plan:

## 2010-09-19 NOTE — Op Note (Signed)
NAMEJANELLE, Crystal Garza NO.:  [id number]   MEDICAL RECORD NO.:  [id number]          PATIENT TYPE:  EMS   LOCATION:  URG                          FACILITY:  [facility]   PHYSICIAN:  Malachi Pro. Ambrose Mantle, M.D. DATE OF BIRTH:  1970/09/11   DATE OF PROCEDURE:  04/24/2004  DATE OF DISCHARGE:  04/23/2004                                 OPERATIVE REPORT   PREOPERATIVE DIAGNOSES:  Menorrhagia and dysmenorrhea, fibroids, anemia.   POSTOPERATIVE DIAGNOSES:  Menorrhagia and dysmenorrhea, fibroids, anemia.   OPERATION:  Supracervical hysterectomy, lysis of adhesions.   SURGEON:  Malachi Pro. Ambrose Mantle, M.D.   ASSISTANT:  Zenaida Niece, M.D.   ANESTHESIA:  General.   DESCRIPTION OF PROCEDURE:  The patient was brought to the operating room and  placed under satisfactory general anesthesia then she was placed in frog leg  position. The abdomen was prepped with Betadine solution, the vagina was  prepped, urethra was prepped and the Foley catheter was inserted to straight  drain.  The patient was then placed supine, the abdomen was draped as a  sterile field. I told her preoperatively that we would make a transverse  incision but it would be close to the level of the umbilicus since the  panniculus drew the umbilicus inferiorly. I also told her that we would most  likely do a supracervical hysterectomy to do the simplest procedure possible  in the least amount of time that provided her relief of the complaints that  she had.  After the abdomen was draped as a sterile field, a transverse  incision was made through the skin and subcutaneous tissue. The umbilicus  was not only pulled inferiorly by the panniculus but it was pulled well to  the left of the midline. There the incision was more to the right of the  umbilicus. There appeared to be probably 6 inches of subcutaneous tissue. We  then identified the fascia, incised it transversely, dissected the rectus  muscles free from the  fascia, entered the midline into the peritoneal  cavity. I did not attempt to explore the upper abdomen because the incision  was quite narrow.  I then inserted the self retaining retractor and the  uterus was right under the operative field.  With the use of packs and the  retractor, I was able to expose the fundus of the uterus and the tubes and  ovaries. There was evidence of previous tubal ligation but otherwise no  abnormalities of the adnexa. There was at least one small fibroid on the  uterus. The round ligaments were bilaterally clamped, cut and suture  ligated. I did not try to develop a bladder flap because that was far  inferior to where I could visualize. I then divided the uteroovarian  ligaments between clamps and suture ligated them, all suture was #0 Vicryl.  I then gradually worked down the sides of the uterus until I got to the  uterosacral ligaments, clamping, cutting and suture ligating all the  pedicles.  When I got to the uterosacral ligaments, I removed the fundus of  the uterus with a  knife leaving only the cervix. I then sutured the cervix  together with the peritoneum in the back through the muscularis of the  cervix and then established complete hemostasis.  Liberal irrigation  confirmed hemostasis, the tubes and ovaries appeared normal. The pelvis was  dry, we were never close to the ureters, the bladder was well free of the  operative field. I then removed all packs and retractors, checked for  bleeding in the abdominal wall, closed the rectus muscle with interrupted  sutures of #0 Vicryl, the rectus fascia with  multiple interrupted figure-of-eight sutures of #0 Vicryl, subcu with  interrupted sutures of 3-0 Vicryl and the skin was closed with automatic  staples. The patient tolerated the procedure well. Blood loss was only about  200 mL.  Sponge and needle counts were correct and she was returned to  recovery in satisfactory condition.     Scharlene Corn    TFH/MEDQ  D:  04/24/2004  T:  04/24/2004  Job:  [id number]

## 2010-09-19 NOTE — Discharge Summary (Signed)
Crystal Garza, Crystal Garza NO.:  [id number]   MEDICAL RECORD NO.:  [id number]          PATIENT TYPE:  INP   LOCATION:  3709                         FACILITY:  [facility]   PHYSICIAN:  Douglass Rivers, M.D.   DATE OF BIRTH:  1970/11/12   DATE OF ADMISSION:  09/26/2004  DATE OF DISCHARGE:  09/27/2004                                 DISCHARGE SUMMARY   DISCHARGE DIAGNOSES:  1.  Chest pain, no evidence for pulmonary embolus.  2.  Tachycardia.  3.  Lower extremity edema.  4.  Depression.  5.  Osteoarthritis.   DISCHARGE MEDICATIONS:  1.  Lisinopril 20 mg daily.  2.  Protonix 40 mg daily.  3.  Wellbutrin SR 150 mg b.i.d.  4.  Zyrtec 10 mg daily.  5.  Fioricet as directed.  6.  Flexeril 10 mg q.h.s. p.r.n.  7.  Tramadol 50 mg q.h.s.   PROCEDURES:  1.  Two chest CT scans which revealed decreased visualization due to      patient's size, but no PE visualized due to this limitation.  2.  Lower extremity Doppler, no DVT.   BRIEF HOSPITAL COURSE:  [name] is a 40 year old patient who presented to  the emergency room with complaint of left-sided pleuritic type chest pain.  She also had a history of asymmetric swelling of her right extremity in  which she has significant osteoarthritis and had been receiving Synvisc  injections. Please see admission dictation for full details. However,  basically the patient was admitted due to concern of a pulmonary embolus.  Her D-dimer was elevated at 0.8. CT scans did not reveal a pulmonary embolus  and Doppler's were negative. She was observed over night and on the  subsequent morning her chest pain and symptoms had resolved. Her vital signs  were stable throughout. By the time she was observed on the floor her  tachycardia had resolved and her resting pulse was 70.  Her other chronic medical conditions are all stable. On Sep 27, 2004 the  patient was discharged to home in good condition. She was instructed to call  Dr. Cathey Endow for a  hospital follow up in 1-2 weeks. If the asymmetric swelling  of her leg persists she may want to consider repeating the Doppler for  increased sensitivity.      CH/MEDQ  D:  09/27/2004  T:  09/27/2004  Job:  [id number]   cc:   Seymour Bars, D.O.  [facility].  Family Prac. Resident  [place], [place] [address]  Fax: [phone]

## 2010-09-19 NOTE — Op Note (Signed)
NAME:  Crystal Garza, Crystal Garza NO.:  [id number]   MEDICAL RECORD NO.:  [id number]                   PATIENT TYPE:  AMB   LOCATION:  ENDO                                 FACILITY:  [facility]   PHYSICIAN:  Danise Edge, M.D.                DATE OF BIRTH:  [birth year]   DATE OF PROCEDURE:  05/18/2002  DATE OF DISCHARGE:                                 OPERATIVE REPORT   PROCEDURE:  Esophagogastroduodenoscopy.   INDICATIONS:  The patient is a 40 year old female born [birth year].  The patient  takes Protonix daily to control heartburn.  Despite taking Protonix, she has  a burning discomfort in the left upper quadrant aspect of her abdomen.   ENDOSCOPIST:  Danise Edge, M.D.   PREMEDICATION:  Versed 10 mg, Demerol 50 mg.   ENDOSCOPIST:  Olympus pediatric colonoscope.   DESCRIPTION OF PROCEDURE:  After obtaining informed consent, the patient was  placed in the left lateral decubitus position.  I administered intravenous  Demerol and intravenous Versed to achieve conscious sedation for the  procedure.  The patient's blood pressure, oxygen saturation, and cardiac  rhythm were monitored throughout the procedure and documented in the medical  record.   The Olympus gastroscope was passed through the posterior hypopharynx into  the proximal esophagus without difficulty.  The hypopharynx and larynx  appeared normal.  I did not visualize the vocal cords.   Esophagoscopy:  The proximal, mid-,  and lower segments of the esophagus  appear normal.  There is no endoscopic evidence for the presence of  Barrett's esophagus, erosive esophagitis, esophageal mucosal scarring.   Gastroscopy:  Retroflexed view of the gastric cardia and fundus was normal.  The gastric body, antrum, and pylorus appeared normal.   Duodenoscopy:  The duodenal bulb and descending duodenum appeared normal.   Biopsy:  A biopsy was taken from the distal gastric antrum for pathology to  rule out  Helicobacter pylori antral gastritis.  A CLOtest was not performed  because the patient is on Protonix.   ASSESSMENT:  Normal esophagogastroduodenoscopy.  Antral biopsy pending to  rule out Helicobacter pylori antral gastritis.    RECOMMENDATIONS:  I do not have a good explanation why the patient has  burning discomfort in her left upper quadrant.  I would recommend a CT scan  of her abdomen to visualize structures in the left upper quadrant outside  the intestine as the next diagnostic test.                                               Danise Edge, M.D.    MJ/MEDQ  D:  05/18/2002  T:  05/18/2002  Job:  [id number]   cc:   Georgann Housekeeper, M.D.  [address]  [place] [address]  Fax: [phone]

## 2010-09-19 NOTE — Op Note (Signed)
NAME:  Crystal Garza, Crystal Garza                         ACCOUNT NO.:  [id number]   MEDICAL RECORD NO.:  [id number]                   PATIENT TYPE:  AMB   LOCATION:  DSC                                  FACILITY:  [facility]   PHYSICIAN:  Artist Pais. Mina Marble, M.D.           DATE OF BIRTH:  [birth year]   DATE OF PROCEDURE:  DATE OF DISCHARGE:                                 OPERATIVE REPORT   PREOPERATIVE DIAGNOSIS:  Right carpal tunnel syndrome.   POSTOPERATIVE DIAGNOSIS:  Right carpal tunnel syndrome.   PROCEDURE:  Right carpal tunnel release.   ASSISTANT:  ____________   ANESTHESIA:  General.   TOURNIQUET TIME:  12 minutes.   COMPLICATIONS:  None.   DESCRIPTION OF OPERATION:  The patient was taken to the operating room for  the induction of adequate general anesthesia.  The right upper extremity was  prepped and draped in the usual sterile fashion.  An Esmarch was used to  exsanguinate the limb, tourniquet inflated to 250 mmHg at this point in  time.  A 2 cm incision was made in the palmar aspect of the right hand in  line with the long finger metacarpal starting at the Kaplan's cardinal line.  The incision was taken down through the skin and subcutaneous tissues.  The  skin was incised, palmar fascia was identified and split.  The distal edge  of the transcarpal ligament was identified.  The superficial palmar arch was  retracted distally.  A #15 blade was then used to incise the distal edge of  the transcarpal ligament.  The median nerve was exposed, protected with the  Therapist, nutritional.  The median aspects of the transcarpal ligament were divided  under direct vision.  After this was done, the canal was inspected.  No  osseous lesions or ganglion was present.  It was irrigated and closed with  running 3-0 Prolene subcuticular stitch.  Steri-Strips, 4 x 4 sloughs, and  compressive dressing was applied.  The patient tolerated the procedure well,  to recovery room in stable fashion.                                             Artist Pais Mina Marble, M.D.    MAW/MEDQ  D:  09/12/2003  T:  09/12/2003  Job:  [id number]

## 2010-09-19 NOTE — H&P (Signed)
Crystal Garza, [name] NO.:  [id number]   MEDICAL RECORD NO.:  [id number]          PATIENT TYPE:  INP   LOCATION:  3709                         FACILITY:  [facility]   PHYSICIAN:  Pearlean Brownie, M.D.DATE OF BIRTH:  October 14, 1970   DATE OF ADMISSION:  09/26/2004  DATE OF DISCHARGE:                                HISTORY & PHYSICAL   ATTENDING PHYSICIAN:  Pearlean Brownie, M.D.   RESIDENT:  Ursula Beath, M.D.   CHIEF COMPLAINT:  Chest pain.   HISTORY OF PRESENT ILLNESS:  The patient is a 40 year old female with a past  medical history outlined below who presents to the ED with 1-day history of  left-sided chest pain. She noticed it the morning prior to admission but it  gradually worsened. She also has shortness of breath and diaphoresis along  with her chest pain. Her pain is worse with deep inspiration. She also notes  that her left leg has been swollen also as well and she has had some  dizziness.   REVIEW OF SYSTEMS:  Positive for subjective fever. Negative for chills.  Positive for chest pain. Negative for palpitations. Positive for shortness  of breath. Negative for cough or hemoptysis. Negative for nausea, vomiting,  diarrhea, constipation, abdominal pain, melena, or hematochezia. Negative  for focal weakness or slurred speech. Positive for left lower extremity pain  and edema. Negative for visual changes. Negative for vaginal discharge or  dysuria.   PAST MEDICAL HISTORY:  Significant for:  1.  Obesity.  2.  Osteoarthritis.  3.  Gastroesophageal reflux disease.  4.  Hypertension.  5.  Allergic rhinitis.  6.  Depression.  7.  Low-back pain.  8.  Headache.  9.  A TIA in 2001.  10. A brain cyst.   PAST SURGICAL HISTORY:  Includes:  1.  A carpal tunnel release on the right in May 2005.  2.  Left knee arthroscopy most recently in January 2004.  3.  Total abdominal hysterectomy without oophorectomy in December 2005.   MEDICATIONS:  1.   Fioricet q.4h. p.r.n. headache.  2.  Flexeril 10 mg p.o. q.h.s. p.r.n.  3.  Lisinopril 20 mg p.o. daily.  4.  Protonix 40 mg p.o. daily.  5.  Tramadol 50 mg p.o. q.4-6h. p.r.n. pain.  6.  Wellbutrin SR 150 mg p.o. b.i.d.  7.  Zyrtec 10 mg p.o. daily.   ALLERGIES:  CODEINE - she gets hives.   PAST FAMILY HISTORY:  Her father died at age 60 with brain cancer. Her  mother is living and healthy. Her brother is 21 and healthy, sister is 60  and healthy.   SOCIAL HISTORY:  She lives with her fiance and three children. She is a  nonsmoker, denies alcohol. She is on disability for knee pain and  depression.   PHYSICAL EXAMINATION:  VITAL SIGNS:  Temperature 99.0, heart rate ranged  from 101 to 107, respirations 20, blood pressure 109/55 to 129/48,  saturating 100% on room air.  GENERAL:  She is alert and oriented in no acute distress.  HEENT:  Pupils equal, round, and reactive to light.  Extraocular motions are  intact. Oropharynx is without erythema or exudate. She has no thyromegaly  and no lymphadenopathy and her neck is supple.  LUNGS:  Clear to auscultation bilaterally with no wheeze or rhonchi noted.  CARDIOVASCULAR:  Mildly tachycardic but with a regular rhythm.  CHEST:  Tender to palpation on the left side at approximately the fifth rib  laterally.  EXTREMITIES:  Her left lower extremity appears to be somewhat more swollen  than the right and she seems to have a positive Homans sign on the left,  negative on the right.  ABDOMEN:  Obese, soft, nontender, nondistended, with normoactive bowel  sounds. She has no hepatosplenomegaly.  NEUROLOGIC:  Cranial nerves II-XII are grossly intact. Strength is 5/5 in  her bilateral upper and lower extremities. DTRs are 1+ in her bilateral  upper and lower extremities. DTRs are 1+ in her bilateral upper and lower  extremities and equal. Sensation is grossly intact.   LABORATORY DATA:  An i-STAT 8 shows a pH of 7.339, CO2 of 50.9, bicarb of   27.4, sodium 139, potassium 4.1, chloride 105, BUN 7, creatinine 0.8,  glucose 78. Point-of-care enzymes first set:  CK-MB 1.7, troponin less than  0.05, myoglobin 119. CBC:  White count 12.5, H&H 12.3 and 40, platelets 551.  She had 71% neutrophils.   ASSESSMENT AND PLAN:  1.  This is a 40 year old female with chest pain, left lower extremity      edema, elevated D-dimer, and tachycardia. Unfortunately, her first CT      scan was not conclusive as they were unable to get an effect amount of      contrast in through her IV. I discussed this with the radiologist and he      recommended placing a 20-gauge IV in her antecubital vein and to re-CT      her chest. He felt that given her age and her fairly healthy status and      her creatinine of 0.8 that this would be safe to perform this evening,      so I will attempt to do this while she is still in the ED. Given her      signs and symptoms, I will empirically start heparin and admit to a      telemetry bed. If she rules out for a PE she may be able to go home      tomorrow. If CT scan shows that she indeed does have a PE we will start      Coumadin in addition to the heparin. Other differential diagnoses      include cardiac ischemia; however, her cardiac enzymes were negative in      the ED. She has no past medical history significant for this and an LDL      was 101, so she seems fairly low risk for this. Chest wall pain seems      the most likely diagnosis if she is ruled out for a PE. I will given her      NSAIDs but still think it is prudent to empirically start the heparin      until a PE is ruled out. Musculoskeletal pain:  She did have a chest x-      ray that showed no evidence of a rib fracture or other bony injury so      this seems unlikely at this time. Pneumonia:  She has a slightly      increased white blood count but  no obvious infiltrate on chest x-ray so      this seems unlikely at this time and I will not start  antibiotics until     she declares herself to have an actual  pneumonia.  2.  Hypertension. She seems to be well controlled on her lisinopril so I      will continue this.  3.  Gastroesophageal reflux disease. I will continue her Protonix.  4.  Depression. I will continue her Wellbutrin.  5.  Osteoarthritis. I will continue her p.r.n. tramadol if her pain flares.  6.  Low-back pain. I will continue her Flexeril p.r.n. if her pain flares.  7.  Allergic rhinitis. I will continue her Zyrtec.  8.  Diet will be regular.      JT/MEDQ  D:  09/27/2004  T:  09/27/2004  Job:  [id number]   cc:   Seymour Bars, D.O.  [facility].  [facility] Resident  [place], [place] [address]  Fax: [phone]

## 2010-09-19 NOTE — H&P (Signed)
NAMEARES, TEGTMEYER NO.:  [id number]   MEDICAL RECORD NO.:  [id number]          PATIENT TYPE:  INP   LOCATION:  NA                           FACILITY:  [facility]   PHYSICIAN:  Malachi Pro. Ambrose Mantle, M.D. DATE OF BIRTH:  1971-03-10   DATE OF ADMISSION:  04/24/2004  DATE OF DISCHARGE:                                HISTORY & PHYSICAL   PRESENT ILLNESS:  This is a 40 year old black female, para 3-0-1-3, who was  admitted to the hospital for hysterectomy because of severe dysmenorrhea,  menorrhagia, and fibroids.  Her last menstrual period was April 11, 2004.  The patient delivered her babies through our practice, but then was absent  from our practice for approximately 10 years, from 21 until 2005.  She was  told that she had fibroids at one point, but came to our office on August 09, 2003 complaining that over the last 6-7 years she had had increased flow,  increased pain with her periods, she was using 12 pads a day for 4-5 days,  and she claimed that her cramps were a 10/10.  She claimed that her pain was  so severe that she would have to lie down the first 2 days of the period.  An ultrasound was done that showed a mid-uterine segment, partially  exophytic 2.5 cm fibroid, and probable diffuse myometrial fibroid change.  The patient was placed on Naprosyn, and was encouraged to try a non-surgical  treatment of menorrhagia.  She claimed that the Naprosyn caused her stomach  to burn, and she was given Darvocet in September and November.  On March 17, 2004, she called my office complaining of severe pain and bleeding,  wanting a hysterectomy.  Again, she was advised to try non-surgical options,  and on April 15, 2004, she was seen in the office, examined with no  change in the exam.  She was offered an IUD, she was offered endometrial  ablation, but she persists in wanting to have a hysterectomy, and I told her  that I would probably do a supracervical hysterectomy  because of her massive  obesity.   PAST MEDICAL HISTORY:   ALLERGIES:  CODEINE.   OPERATIONS:  1.  Cesarean section.  2.  Tubal ligation.  3.  Arthroscopy on her left knee.  4.  Right hand surgery.   ILLNESSES:  1.  High blood pressure.  2.  Arthritis of the left knee.  3.  No heart problems.   REVIEW OF SYSTEMS:  Negative, except as in the present illness and  headaches.   SOCIAL HISTORY:  No alcohol or tobacco.  She did graduate from [employer] in [place].  She got a C.N.A. certificate from [employer].   FAMILY HISTORY:  Her mother is 40 with high blood pressure.  Father died at  17 of brain cancer.  One sister and 1 brother are living and well.   PHYSICAL EXAMINATION:  GENERAL:  Tall, obese, black female weighing 354  pounds.  VITAL SIGNS:  Blood pressure 120/80, pulse 80.  HEENT:  No cranial abnormalities.  Extraocular movements intact.  There is  an upper dental plate.  Some absent lower molars.  NECK:  Supple without thyromegaly.  HEART:  Normal size and sounds.  Rate varies between 80 and 100.  There are  no murmurs.  BREASTS:  Soft without masses.  LUNGS:  Clear to auscultation.  ABDOMEN:  Soft with massive obesity.  There is a huge panniculus that would  make any lower abdominal incision unwise.  PELVIC:  The vulva and vagina are clear.  The patient would be a candidate  for vaginal surgery, but her buttocks protrude far away from her introitus,  and there would be difficulty in inserting a speculum and seeing the cervix.  The cervix is clean.  The uterus is anterior, 7-8 weeks size, irregular,  with fibroids.  The adnexa are clear.   ADMITTING IMPRESSION:  Leiomyomata uteri, dysmenorrhea, and menorrhagia that  are impacting on her life.   PLAN:  The patient is admitted for an abdominal hysterectomy, probably  supracervical, possible bilateral salpingo-oophorectomy.  The patient has  been counseled about the risks of surgery including, but not limited to,   heart attack, stroke, pulmonary embolus, wound disruption, hemorrhage with  need for reoperation and/or transfusion, fistula formation, nerve injury,  and intestinal obstruction.  She understands and agrees to proceed with  surgery.     Scharlene Corn   TFH/MEDQ  D:  04/23/2004  T:  04/23/2004  Job:  [id number]

## 2010-09-21 ENCOUNTER — Encounter: Payer: Self-pay | Admitting: Family Medicine

## 2010-09-21 NOTE — Assessment & Plan Note (Addendum)
Will complete packet for Lap band surgery Pt will need 3 monthly visits in a row Letter written on pt behalf Pt has tried many other forms of weight loss and while her weight is down over the past few months with her [employer] training she has a lot of weight to go. Will try to increase gym to 3x per week, limit sweet teas

## 2010-09-21 NOTE — Assessment & Plan Note (Signed)
Lap-band or bypass will definitely help her co-morbid diseases, pt to continue current BP meds, has had good control

## 2010-09-22 ENCOUNTER — Ambulatory Visit: Payer: Medicare Other | Admitting: *Deleted

## 2010-09-22 DIAGNOSIS — Z111 Encounter for screening for respiratory tuberculosis: Secondary | ICD-10-CM

## 2010-09-22 MED ORDER — TUBERCULIN PPD 5 UNIT/0.1ML ID SOLN: 5.0000 [IU] | Freq: Once | INTRADERMAL | Status: DC

## 2010-09-24 ENCOUNTER — Ambulatory Visit (INDEPENDENT_AMBULATORY_CARE_PROVIDER_SITE_OTHER): Payer: Medicare Other | Admitting: *Deleted

## 2010-09-24 DIAGNOSIS — IMO0001 Reserved for inherently not codable concepts without codable children: Secondary | ICD-10-CM

## 2010-09-24 DIAGNOSIS — Z111 Encounter for screening for respiratory tuberculosis: Secondary | ICD-10-CM

## 2010-09-24 NOTE — Progress Notes (Signed)
PPD negative-0 mm. 

## 2010-09-25 ENCOUNTER — Encounter (INDEPENDENT_AMBULATORY_CARE_PROVIDER_SITE_OTHER): Payer: Medicare Other | Admitting: Family Medicine

## 2010-09-25 NOTE — Progress Notes (Signed)
  Subjective:    Patient ID: Crystal Garza, female    DOB: 1970-07-06, 40 y.o.   MRN: [id number]  HPI  Error pt never came to visit  Review of Systems     Objective:   Physical Exam        Assessment & Plan:

## 2010-10-03 ENCOUNTER — Other Ambulatory Visit: Payer: Self-pay | Admitting: Family Medicine

## 2010-10-03 NOTE — Telephone Encounter (Signed)
Refill request for Dr. [place]'s patient. 

## 2010-10-22 ENCOUNTER — Encounter: Payer: Self-pay | Admitting: Family Medicine

## 2010-10-22 ENCOUNTER — Ambulatory Visit (INDEPENDENT_AMBULATORY_CARE_PROVIDER_SITE_OTHER): Payer: Medicare Other | Admitting: Family Medicine

## 2010-10-22 VITALS — BP 121/76 | HR 87 | Temp 97.6°F | Ht 66.5 in | Wt 379.0 lb

## 2010-10-22 DIAGNOSIS — R300 Dysuria: Secondary | ICD-10-CM

## 2010-10-22 DIAGNOSIS — M549 Dorsalgia, unspecified: Secondary | ICD-10-CM

## 2010-10-22 DIAGNOSIS — L259 Unspecified contact dermatitis, unspecified cause: Secondary | ICD-10-CM

## 2010-10-22 DIAGNOSIS — L309 Dermatitis, unspecified: Secondary | ICD-10-CM | POA: Insufficient documentation

## 2010-10-22 LAB — POCT UA - MICROSCOPIC ONLY

## 2010-10-22 LAB — POCT URINALYSIS DIPSTICK
Bilirubin, UA: NEGATIVE
Glucose, UA: NEGATIVE
Leukocytes, UA: NEGATIVE
Nitrite, UA: NEGATIVE
Protein, UA: NEGATIVE
Spec Grav, UA: 1.025
Urobilinogen, UA: 0.2
pH, UA: 7

## 2010-10-22 MED ORDER — TRIAMCINOLONE ACETONIDE 0.1 % EX OINT: TOPICAL_OINTMENT | Freq: Two times a day (BID) | CUTANEOUS | Status: DC

## 2010-10-22 MED ORDER — HYDROCODONE-ACETAMINOPHEN 7.5-500 MG PO TABS: 1.0000 | ORAL_TABLET | Freq: Three times a day (TID) | ORAL | Status: AC | PRN

## 2010-10-22 MED ORDER — CYCLOBENZAPRINE HCL 10 MG PO TABS: ORAL_TABLET | ORAL | Status: DC

## 2010-10-22 NOTE — Patient Instructions (Signed)
Continue your current medications I will call you this afternoon with lab results for the urine test Keep stretching and moving around with your back- you can take the Meloxicam daily for the next 2 weeks Use the Hydrocodone only as needed Use the steroid cream for your arm, twice  A day for the next week. Next appt in 1 month for your weight loss

## 2010-10-22 NOTE — Assessment & Plan Note (Signed)
While this is not typical presentation of her chronic back pain, UA does not suggest UTI, likley strain with increase exercise Start Meloxicam for next 2 weeks, refilled narcotic med Pt to keep active, given red flags

## 2010-10-22 NOTE — Assessment & Plan Note (Signed)
No change in weight Continue to work on diet and keeping exercise routine She will call and see if the program has a nutrionist if, not she will make an appt to see Danie Chandler here at the [facility]

## 2010-10-22 NOTE — Progress Notes (Signed)
  Subjective:    Patient ID: Crystal Garza, female    DOB: 1970-10-24, 40 y.o.   MRN: [id number]  HPI Pt here to follow up as 2nd weight loss visit, she is attempting to get into bariatric program - Please see sheets for complete detail, she has been exercising 4x a week now, no weight change, eating veggies at least once a day, working on fast food, now walking the stairs as a part of weight loss program  Back- lower thoracic back pain for past 2 weeks, not sure if this is muscles or a kidney infection. Had some dysuria last week, no vaginal discharge, no abd pain, no fever, no chills, has not taken any meds, pain with bending in certain positions No loss of bladder Rash- rash on left arm, comes up every year, itchy and leaves dark spots, used a fungal cream on skin last night   Review of Systems - per above     Objective:   Physical Exam   GEN- NAD, alert and oriented- morbidly obese   Back- no CVA tenderness, pain with palpation of thoracic paraspinal region and lumbar region, neg SLR, but pulling in same area of tenderness with movement of lower ext, flexion at waist, able to rotate, able to walk on toes (limited exams)  Skin- eczematous rash on forearm of left - hyperpigmented scarring       Assessment & Plan:

## 2010-10-22 NOTE — Assessment & Plan Note (Signed)
Rash appears yearly, treat with TAC ointment

## 2010-10-23 ENCOUNTER — Telehealth: Payer: Self-pay | Admitting: *Deleted

## 2010-10-23 NOTE — Telephone Encounter (Signed)
Informed pt of lab results. Lorenda Hatchet, Renato Battles

## 2010-10-28 ENCOUNTER — Encounter: Payer: Self-pay | Admitting: Family Medicine

## 2010-11-17 ENCOUNTER — Ambulatory Visit (INDEPENDENT_AMBULATORY_CARE_PROVIDER_SITE_OTHER): Payer: Medicare Other | Admitting: Family Medicine

## 2010-11-17 ENCOUNTER — Encounter: Payer: Self-pay | Admitting: Family Medicine

## 2010-11-17 DIAGNOSIS — G43909 Migraine, unspecified, not intractable, without status migrainosus: Secondary | ICD-10-CM

## 2010-11-17 DIAGNOSIS — M549 Dorsalgia, unspecified: Secondary | ICD-10-CM

## 2010-11-17 DIAGNOSIS — F411 Generalized anxiety disorder: Secondary | ICD-10-CM

## 2010-11-17 DIAGNOSIS — F419 Anxiety disorder, unspecified: Secondary | ICD-10-CM

## 2010-11-17 MED ORDER — NORTRIPTYLINE HCL 25 MG PO CAPS: 25.0000 mg | ORAL_CAPSULE | Freq: Every day | ORAL | Status: DC

## 2010-11-17 MED ORDER — BUTALBITAL-APAP-CAFFEINE 50-325-40 MG PO TABS: 1.0000 | ORAL_TABLET | Freq: Every day | ORAL | Status: DC

## 2010-11-17 NOTE — Assessment & Plan Note (Signed)
No change in weight, but she is exercising. Will refer her to Danie Chandler here at our office for help with her diet and getting her on the right track for weight loss.  I gave her a graph of her weights in the last year as a visual of her loss and challenged her to make the line drop even more. She was very impressed with the chart and it will hopefully be a positive motivational tool.

## 2010-11-17 NOTE — Progress Notes (Signed)
  Subjective:    Patient ID: Crystal Garza, female    DOB: 30-May-1970, 40 y.o.   MRN: [id number]  HPI  Pt comes to office today to meet me, as well as one of her monthly follow-up appointments for weight loss. She is currently taking the steps to get Lap Band surgery at [facility] with Dr. Daphine Deutscher. She must be seen by primary care for many months and have documented weight loss in order to have surgery. She was being seen by Dr. Jeanice Lim for this, and I will continue to help her along the way.  1) Weight loss- She has not had a loss since her last visit. She states she is walking 2-3 blocks/day and is trying to walk more. She does not wear a pedometer. As far as her diet is concerned, she says she is making small changes along the way (ie- switching to whole wheat bread for sandwiches, drinking more water and less tea, eating less fast food) but she still feels stuck.  2) Anxiety- Pt stated she wanted to discuss her anxiety at this visit also. She said she has more stress lately with recent deaths, 2 sons incarcerated, taking care of a granddaughter with sickle cell and her daughter moved back home after she got pregnant. She stated that she feels "something like a panic attack and then explodes." She called the [facility] to see if she could talk to a therapist but they told her to speak with me today about getting someone inside the network. She is currently taking Xanax 0.5mg  twice a day and it does help.  3) Back Pain- Improved since last visit with Dr. Jeanice Lim and controlled at this time with narcotics. Pt asked if I could refill her Vicodin. I did not see this on the medication list. She has an unfilled Rx from Dr. Jeanice Lim that she will get filled when her current Rx from the orthopedist runs out.   4) Eczematous dermatitis- Will discuss at her next visit. Currently using Kenalog.   Review of Systems     Objective:   Physical Exam        Assessment & Plan:

## 2010-11-17 NOTE — Patient Instructions (Signed)
It was nice to meet you today!  Please call Dr. Pascal Lux to make an appointment with her for counseling.  Dr. Gerilyn Pilgrim is our dietitian. I am putting in a referral for her to call you to schedule an appointment.  Great job on your weight loss, keep up the good work! I look forward to seeing you next month to see where you are with your weight.   Amber M. Hairford, M.D.

## 2010-11-17 NOTE — Assessment & Plan Note (Signed)
Refilled Nortriptyline and Fioricet today. She states if she does not take both of these, she gets daily migraines. I will continue to monitor headaches, and address at a future visit.

## 2010-11-17 NOTE — Assessment & Plan Note (Signed)
Chronic back pain, seen by ortho. She states it is well controlled with Vicodin 7.5/500 every 6-8 hours. This medication was being filled by ortho but last refill by Dr. Jeanice Lim. Pt states she wants it to be filled here. She did have the Rx written by Dr. Jeanice Lim with her that she will have filled when her current rx runs out. We discussed this and I told her when she comes to see me next month, we would sign a pain contract before I refilled the Vicodin again and she agreed. Will continue to monitor pain.

## 2010-11-17 NOTE — Assessment & Plan Note (Signed)
Pt would like to go to therapy. Since she was not able to go to [facility], I discussed her case with Dr. Pascal Lux who feels like she is a good candidate for her clinic. Discussed this with pt and she seemed very willing to schedule an appointment with Dr. Pascal Lux. I gave pt her card and she will call to make appt.  Pt has multiple social stressors in her life. She is on Xanax 0.5mg  scheduled BID which she feels like helps, but she is very interested in therapy.

## 2010-11-25 ENCOUNTER — Telehealth: Payer: Self-pay | Admitting: Psychology

## 2010-11-25 NOTE — Telephone Encounter (Signed)
Patient called to schedule an appt.  Reviewed chart.  Discussed insurance issues.  She will call the mental health number on her insurance card to determine if I am an approved provider.  Scheduled for August 13th at 10:00 (she is out of town the week before).  I explained that I need a phone call if she is unable to attend her appointment.  She voiced an understanding.

## 2010-11-25 NOTE — Telephone Encounter (Signed)
Nolah called back to give an authorization code that I may need for billing purposes.  The number is [id number] and it is good through 05/04/11.  I will forward to [facility] in the front office to see if there is anything we need to do.

## 2010-11-27 ENCOUNTER — Ambulatory Visit (INDEPENDENT_AMBULATORY_CARE_PROVIDER_SITE_OTHER): Payer: Medicaid Other | Admitting: Family Medicine

## 2010-11-27 DIAGNOSIS — I10 Essential (primary) hypertension: Secondary | ICD-10-CM

## 2010-11-27 NOTE — Progress Notes (Signed)
Medical Nutrition Therapy:  Appt start time: 0900 end time:  1000.  Assessment:  Primary concerns today: Weight management and hypertension.  [name] has been to the seminar re. lap-band surgery, and is very familiar with the process, as she had a friend go through bariatric surgery.  Usual eating pattern includes 2-3 meals and 4 snacks per day.  Everyday foods include water, chicken, green beans, corn, rice, mac & cheese, and Kool-Aid.  Avoided foods include milk products (lactose intol). 24-hr recall: B (7 AM)- 2 c cooked oatmeal w/ 1.5 tsp butter, 2 tbsp sugar, water; Snk (10 AM)- 12 oz Kool-Aid; L (12:30 PM)- 2.5 c salad w/ 1 tbsp bacon, 1 oz chs, 2 tbsp ranch dressing, 1/2 sleeve saltines; Snk (2 PM)- banana, 12 oz Kool-Aid; D (7 PM)- 2 frozen waffles w/ 2 tbsp syrup & 2 tsp butter, carrots w/ dip, 4 oz 2% milk; Snk (12 PM)- 12 oz Kool-Aid, 1/2 sleeve saltines.  Usual physical activity includes walking daily, 5-20 min (3 blocks).  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   Progress Towards Goal(s):  In progress.   Nutritional Diagnosis:  NB-2.1 Physical inactivity As related to intake.  As evidenced by elevated BMI. NI-5.8.3 Inappropriate intake of types of carbohydrates (specify): added sugars As related to beverages.  As evidenced by consumption of Kool-Aid at least three times yesterday, and self-report of daily intake.    Intervention:  Nutrition education.  Monitoring/Evaluation:  Dietary intake, exercise, and body weight in 1 month.

## 2010-11-27 NOTE — Patient Instructions (Addendum)
-   Eat at least 3 meals and 1-2 snacks per day.  Aim for no more than 5 hours between eating. - Limit the dietary fat, and choose high-quality fats, which include olive or canola oil, UNsalted nuts/seeds (1 tbsp = 1 serving), avocado, fatty fish (not fried).  Be cautious in use of condiments such as butter, salad dressing, mayo; choose low-fat versions.  Switch to fat-free milk.  (You may want to try 1% for about a month.) - Limit sugar:  Water, unsweetened drinks, and diet drinks ONLY.   - Obtain twice as many veg's as protein or carbohydrate foods for both lunch and dinner.  Try microwaving veg's:  If you use frozen, pour into a bowl, cover with a plate, and microwave on high.  No need to add water.  For fresh greens, try stir-frying in a small amount of olive oil & garlic.  Also try roasting veg's:  Spray a roasting pan with oil, spray more oil on veg's; bake at 400 degrees for ~20 min or till done (zucchini, onion, garlic, peppers, mushrooms). - Increasing your intake of veg's and fruits will increase your potassium, and will likely help your blood pressure (as well as your weight).  - Physical activity: Continue your walking as you're doing now, but ALSO look for exercise opportunities throughout the day:  Never lie down when you can sit; never sit when you can stand; never stand when you can pace.  Also, when you watch TV, get out of your chair at every commercial.   - Call [facility], and ask if they cover Medical Nutrition Therapy (with doctor's referral) for hypertension and pre-bariatric surgery.

## 2010-12-05 ENCOUNTER — Other Ambulatory Visit: Payer: Self-pay | Admitting: Family Medicine

## 2010-12-05 NOTE — Telephone Encounter (Signed)
Refill request

## 2010-12-05 NOTE — Telephone Encounter (Signed)
Will not refill this Rx today. Pt should only be taking one tab po qhs and last refill was on July 17. She has an upcoming appointment with me in a few weeks. I will discuss this with her at that time. I have tried contacting her by phone, but the number we have in the chart is disconnected.  If there are any concerns, I will be happy to talk with Crystal Garza. Thanks! Manu Rubey M. Markeshia Giebel, M.D.

## 2010-12-15 ENCOUNTER — Ambulatory Visit (INDEPENDENT_AMBULATORY_CARE_PROVIDER_SITE_OTHER): Payer: Medicare Other | Admitting: Psychology

## 2010-12-15 ENCOUNTER — Encounter: Payer: Self-pay | Admitting: Psychology

## 2010-12-15 DIAGNOSIS — F339 Major depressive disorder, recurrent, unspecified: Secondary | ICD-10-CM
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
## 2010-12-15 NOTE — Assessment & Plan Note (Signed)
Crystal Garza is neatly groomed and appropriately dressed.  She maintains good eye contact and is cooperative and attentive.  Speech is normal in tone, rate and rhythm.  Mood was reported as anxious.  She displayed a normal affect.  Thought process is logical and goal directed.  No evidence of suicidal or homicidal ideation.  Does not appear to be responding to any internal stimuli.  Able to maintain train of thought and concentrate on the questions.  Judgment and insight are average.  PHQ-9 is an 61 with "somewhat difficult" indicated.  Question 9 is marked as "not at all."  The GAD-7 is scored at 16 which indicates a severe level of anxiety.  She reported the following as occurring "nearly every day:"  feeling nervous, anxious or on edge; not being able to stop or control worrying; worrying too much about different things.  She indicated "somewhat difficult" in terms of level of impairment.  I did not assess symptomatology specifically today.  She reported anxiety today.  There were no obvious outward signs of this.  It sounds like presently, anxiety is the bigger issue although depression has been an issue in the past.  Will refrain from rendering a more specific anxiety diagnosis until further assessment.  Will need to get a clearer idea of patient's goals for therapy.  Follow-up is scheduled for:  August 27th at 2:00.  Will provide feedback on self-report measures at that meeting.

## 2010-12-16 ENCOUNTER — Ambulatory Visit (INDEPENDENT_AMBULATORY_CARE_PROVIDER_SITE_OTHER): Payer: Medicare Other | Admitting: Family Medicine

## 2010-12-16 ENCOUNTER — Encounter: Payer: Self-pay | Admitting: Family Medicine

## 2010-12-16 ENCOUNTER — Ambulatory Visit: Payer: Medicare Other | Admitting: Family Medicine

## 2010-12-16 DIAGNOSIS — F339 Major depressive disorder, recurrent, unspecified: Secondary | ICD-10-CM

## 2010-12-16 DIAGNOSIS — M549 Dorsalgia, unspecified: Secondary | ICD-10-CM

## 2010-12-16 DIAGNOSIS — G43909 Migraine, unspecified, not intractable, without status migrainosus: Secondary | ICD-10-CM

## 2010-12-16 DIAGNOSIS — K529 Noninfective gastroenteritis and colitis, unspecified: Secondary | ICD-10-CM

## 2010-12-16 DIAGNOSIS — K5289 Other specified noninfective gastroenteritis and colitis: Secondary | ICD-10-CM

## 2010-12-16 MED ORDER — NORTRIPTYLINE HCL 25 MG PO CAPS: 25.0000 mg | ORAL_CAPSULE | Freq: Every day | ORAL | Status: DC

## 2010-12-16 MED ORDER — HYDROCODONE-ACETAMINOPHEN 7.5-500 MG PO TABS: 1.0000 | ORAL_TABLET | Freq: Four times a day (QID) | ORAL | Status: DC | PRN

## 2010-12-16 MED ORDER — SERTRALINE HCL 100 MG PO TABS: 100.0000 mg | ORAL_TABLET | Freq: Every day | ORAL | Status: DC

## 2010-12-16 MED ORDER — SUMATRIPTAN SUCCINATE 50 MG PO TABS: 50.0000 mg | ORAL_TABLET | Freq: Once | ORAL | Status: DC | PRN

## 2010-12-16 NOTE — Assessment & Plan Note (Signed)
Pt has been on Fioricet prn for a long time. She states she does not necessarily like the way it makes her feel. After discussion with our PharmD student, pt feels like Sumatriptan would be a great alternative. She is worried that her insurance will not cover it, therefore I printed the Rx for her to take to the pharmacy. She will take it just as needed. We will reevaluate her headaches and how the Sumatriptan works for her at her appointment in September.

## 2010-12-16 NOTE — Assessment & Plan Note (Signed)
3 day history of pain, nausea and diarrhea. Encouraged pt to continue po intake as tolerated. If she has high fever, lethargy, large amounts of vomiting or overall feeling worse, she is to call us.

## 2010-12-16 NOTE — Assessment & Plan Note (Signed)
Pt will continue to see Dr. Pascal Lux for therapy. I feel this will really help her. I refilled her Zoloft today.

## 2010-12-16 NOTE — Progress Notes (Signed)
  Subjective:    Patient ID: Crystal Garza, female    DOB: 1971/02/28, 40 y.o.   MRN: [id number]  HPI  Crystal Garza is here today for a follow-up appointment in preparation for her Lap-band surgery. Today, she is complaining of a 3 day history of abd pain, nausea, diarrhea and decreased appetite. Pt has no sick contacts. She is taking good po fluid but decreased appetite so she is not eating much.   Since her last visit with me, Crystal Garza went to see Dr. Pascal Lux as well as Dr. Gerilyn Pilgrim. She states both of these appointments went well and she has no questions for me about what was discussed.  She only lost 2lbs since last time I saw her. She states she has had a lot of stress with her family, especially with her daughter who is pregnant with gestational diabetes. She continues to follow recommendations from Dr. Gerilyn Pilgrim.   She continues to have chronic back pain, as well as occasional migraines.  Review of Systems  Constitutional: Positive for appetite change and fatigue. Negative for fever and chills.  Respiratory: Negative for shortness of breath.   Cardiovascular: Negative for chest pain.  Gastrointestinal: Positive for nausea, abdominal pain and diarrhea. Negative for vomiting and constipation.  Genitourinary: Negative for dysuria and difficulty urinating.  Musculoskeletal: Positive for back pain.  Skin: Positive for color change.  Neurological: Positive for light-headedness.  Psychiatric/Behavioral: The patient is nervous/anxious.        Objective:   Physical Exam  Nursing note and vitals reviewed. Constitutional: She is oriented to person, place, and time. She appears well-developed and well-nourished. No distress.  HENT:  Head: Normocephalic and atraumatic.  Neck: Neck supple.  Cardiovascular: Normal rate, regular rhythm and normal heart sounds.   Pulmonary/Chest: Effort normal and breath sounds normal. She has no wheezes. She has no rales.  Abdominal: Soft. Bowel sounds are normal. There is  tenderness in the epigastric area and periumbilical area. There is no rigidity, no guarding and no CVA tenderness.       Morbidly obese  Neurological: She is alert and oriented to person, place, and time. No cranial nerve deficit.  Skin: Skin is warm and dry. Rash (Hyperrigmented patches of skin on forearms bilaterally. Pt feels L>R but appear equal) noted.  Psychiatric: She has a normal mood and affect. Her behavior is normal.          Assessment & Plan:

## 2010-12-16 NOTE — Assessment & Plan Note (Signed)
Chronic back pain. Pt has had steroid injections in the past, but not recently. She has been taking Lortab as needed for pain. I feel like a lot of this pain is coming from her morbid obesity which she is in the process of having surgery for, and I discussed this with the patient. We signed a pain contract today while she was in the office, and [name] is aware that we will only do refills during office visits. I refilled her Lortab #60 with 0 refills today. My ultimate goal is to begin weaning her Lortab after her lapband with an ultimate goal of her not needing to take it for back pain after weight loss.

## 2010-12-16 NOTE — Patient Instructions (Signed)
It was nice to see you again today!  I am sorry you are not feeling well. I think this is a little infection. Continue to drink plenty of water or diet ginger ale to stay hydrated. Eat small amounts of bland food like crackers or rice as you feel like it.  I will see you back in one month!  Allante Whitmire M. Jaymeson Mengel, M.D.

## 2010-12-16 NOTE — Assessment & Plan Note (Signed)
This was month #3 of her visits in preparation for lapband surgery. Pt lost 2 lbs but encourage her to work harder to lose more weight. She has had one visit with Dr. Gerilyn Pilgrim (nutrition) which went well, and she will follow-up with her on Aug 27.

## 2010-12-29 ENCOUNTER — Ambulatory Visit: Payer: Medicare Other | Admitting: Family Medicine

## 2010-12-29 ENCOUNTER — Telehealth: Payer: Self-pay | Admitting: Family Medicine

## 2010-12-29 NOTE — Telephone Encounter (Signed)
Pt calling with complaint of bump on forehead that may be from a mosquito bite as well as R face swelling and L hand and L leg burning/tingling weakness. Pt states that this has been present since earlier today. Pt states that she may have been bitten by a mosquito but is unsure. Area of bump itches minimally. Area is not painful. Other complaints have been present since around this time as well. No confusion ,slurred speech, hemiparesis, change in vision. Pt states that she has been taking her medication on a regular basis. No new medication basis. Pt has been able to bear weight on L leg and use R arm/hand today without incident. Pt is R dominant. Pt with prior hx/o anxiety, morbid obesity, and migraine. Pt states that she has not had similar sxs such as these in the past. Pt states that she has been under a tremendous amount of stress recently.  Discussed with pt that symptoms are likely not from a stroke, but rather may be related to migraine or anxiety. Insect bite is likely incidental finding and pt could use benadryl prn. Told pt that if sxs were to worsen to go to ED; otherwise try to be seen in am. Pt agreeable.

## 2010-12-30 ENCOUNTER — Ambulatory Visit (HOSPITAL_COMMUNITY)
Admission: RE | Admit: 2010-12-30 | Discharge: 2010-12-30 | Disposition: A | Discharge status: Home / Self Care | Payer: Medicare Other | Source: Ambulatory Visit | Attending: Family Medicine | Admitting: Family Medicine

## 2010-12-30 ENCOUNTER — Encounter: Payer: Self-pay | Admitting: Family Medicine

## 2010-12-30 ENCOUNTER — Ambulatory Visit (INDEPENDENT_AMBULATORY_CARE_PROVIDER_SITE_OTHER): Payer: Medicare Other | Admitting: Family Medicine

## 2010-12-30 VITALS — BP 120/86 | HR 116 | Temp 98.0°F | Wt 383.0 lb

## 2010-12-30 DIAGNOSIS — I10 Essential (primary) hypertension: Secondary | ICD-10-CM

## 2010-12-30 DIAGNOSIS — R209 Unspecified disturbances of skin sensation: Secondary | ICD-10-CM

## 2010-12-30 DIAGNOSIS — R202 Paresthesia of skin: Secondary | ICD-10-CM

## 2010-12-30 MED ORDER — NORTRIPTYLINE HCL 25 MG PO CAPS: 25.0000 mg | ORAL_CAPSULE | Freq: Every day | ORAL | Status: DC

## 2010-12-30 MED ORDER — NORTRIPTYLINE HCL 50 MG PO CAPS: 50.0000 mg | ORAL_CAPSULE | Freq: Every day | ORAL | Status: DC

## 2010-12-30 NOTE — Patient Instructions (Signed)
Please make an appointment with me for this Friday to follow-up on your arm pain. We'll discuss your labs at that time.   Take 2 tablets of your nortriptyline while you are experiencing your arm pain.

## 2010-12-30 NOTE — Assessment & Plan Note (Addendum)
Appears to have paresthesias/neuralgias in ulnar nerve distribution for the past day without an inciting event. May have some mild weakness in grip strength on left side, but no other worrisome symptoms for now, including other neurological deficits. Will treat neuropathic pain with increased dose of her current nortriptyline (from 25 to 50mg  qhs) which should also help any anxiety component to this pain in this individual with anxiety/depression and chronic pain.  Although patient denies chest pain, will check ECG to make rule-out signs of acute MI in this obese individual.  Her CBGs in the office have been normal in the 90s, so I do not think diabetic neuropathy is likely, although she is at increased risk for diabetes considering her morbid obesity.  Will also check labs since they were not checked recently (last in February of this year). Will check LDL, BMET, TSH, iron studies (since she has a history of iron-deficiency anemia) and CBC.  Will follow-up with her in 1 week to go over her lab results and to re-assess.  If her pain not improved, may consider images of her C-spine to rule-out herniation or other lesions or nerve conduction studies in that arm. May consider testing for polymyalgia rheumatica.   Regarding her other symptoms, I think the wheal on her forehead is due to a bug bite. It does not appear infected at this time. Recommended conservative treatment with keeping it clean and antibiotic ointment. I reassured her that I did not notice any right neck swelling, but if she noticed any, it may be a reaction to her body fighting the bug bite. I advised her to look for signs of cellulitis/infection.  Addendum: ECG: NSR, no ST/TW abnormalities.

## 2010-12-30 NOTE — Progress Notes (Signed)
  Subjective:    Patient ID: Crystal Garza, female    DOB: April 05, 1971, 40 y.o.   MRN: [id number]  HPI 1. Left arm pain and tingling Started later in the day yesterday. Worse this morning. Feels like throbbing, like she has a blood pressure cuff on. Also reports some weakness on that side; she thinks she would have difficulty holding things with that arm for prolonged periods of time. Tingling extends from left elbow to finger-tips.  She does not think anxiety is contributing to her symptoms, although she does have a history of significant anxiety and a new stressor of her 6-year-old grand-daughter whom she is guardian of, starting school.   None of her medications (for anxiety and pain) have helped.   Also with other symptoms that started at the same time: bump on right forehead that she thinks is from a mosquito-bite, right-sided headache, and swelling on right neck.   Has history of migraines but usually presents on the left side. This headache is different, although it is associated with photophobia.  Review of Systems Denies nausea/vomiting/diarrhea, fevers/chills. Denies numbness, urinary/stool incontinence, changes in vision/hearing, difficulty ambulating. Denies chest pain, SOB.    Objective:   Physical Exam  Constitutional:       Morbidly obese. Appears uncomfortable, clutching left forearm.   HENT:  Head: Normocephalic and atraumatic.  Right Ear: External ear normal.  Left Ear: External ear normal.  Nose: Nose normal.  Mouth/Throat: Oropharynx is clear and moist. No oropharyngeal exudate.       No facial swelling.  Raised, erythematous wheal on right forehead with tiny head in center. No pus or extending erythema.   Eyes: Conjunctivae and EOM are normal. Pupils are equal, round, and reactive to light.       No nystagmus  Neck: Normal range of motion. No tracheal deviation present. No thyromegaly present.       Mild TTP cervical spine. Small hump.    Cardiovascular: Normal  rate, regular rhythm, normal heart sounds and intact distal pulses.  Exam reveals no gallop.   No murmur heard. Pulmonary/Chest: Effort normal and breath sounds normal. No respiratory distress. She has no wheezes. She has no rales. She exhibits no tenderness.  Abdominal: Soft. Bowel sounds are normal. She exhibits no distension and no mass. There is no tenderness. There is no rebound and no guarding.       Obese  Lymphadenopathy:    She has no cervical adenopathy.  Neurological: She is alert. She is not disoriented. She displays no atrophy and no tremor. No cranial nerve deficit or sensory deficit. She exhibits normal muscle tone. She displays a negative Romberg sign. Coordination and gait normal.       May have slightly weaker grip strength on left side compared to right. TTP dorsal forearm.   C-spine: some TTP and pain when turning head right, but ROM intact including flexion and extension, turning head left and right.   Psychiatric: Her speech is normal and behavior is normal. Judgment and thought content normal. Her mood appears anxious. Her affect is not angry, not blunt, not labile and not inappropriate. She is not agitated, not slowed and not withdrawn. Cognition and memory are normal. She does not exhibit a depressed mood.          Assessment & Plan:

## 2010-12-31 LAB — CBC WITH DIFFERENTIAL/PLATELET
Basophils Absolute: 0.1 10*3/uL (ref 0.0–0.1)
Basophils Relative: 0 % (ref 0–1)
Eosinophils Absolute: 0.1 10*3/uL (ref 0.0–0.7)
Eosinophils Relative: 1 % (ref 0–5)
HCT: 38 % (ref 36.0–46.0)
Hemoglobin: 11.1 g/dL — ABNORMAL LOW (ref 12.0–15.0)
Lymphocytes Relative: 28 % (ref 12–46)
Lymphs Abs: 3.5 10*3/uL (ref 0.7–4.0)
MCH: 24.3 pg — ABNORMAL LOW (ref 26.0–34.0)
MCHC: 29.2 g/dL — ABNORMAL LOW (ref 30.0–36.0)
MCV: 83.2 fL (ref 78.0–100.0)
Monocytes Absolute: 0.5 10*3/uL (ref 0.1–1.0)
Monocytes Relative: 4 % (ref 3–12)
Neutro Abs: 8.4 10*3/uL — ABNORMAL HIGH (ref 1.7–7.7)
Neutrophils Relative %: 67 % (ref 43–77)
Platelets: 549 10*3/uL — ABNORMAL HIGH (ref 150–400)
RBC: 4.57 MIL/uL (ref 3.87–5.11)
RDW: 16.7 % — ABNORMAL HIGH (ref 11.5–15.5)
WBC: 12.6 10*3/uL — ABNORMAL HIGH (ref 4.0–10.5)

## 2010-12-31 LAB — FERRITIN: Ferritin: 90 ng/mL (ref 10–291)

## 2010-12-31 LAB — BASIC METABOLIC PANEL
BUN: 9 mg/dL (ref 6–23)
CO2: 27 mEq/L (ref 19–32)
Calcium: 9.5 mg/dL (ref 8.4–10.5)
Chloride: 103 mEq/L (ref 96–112)
Creat: 0.65 mg/dL (ref 0.50–1.10)
Glucose, Bld: 82 mg/dL (ref 70–99)
Potassium: 4 mEq/L (ref 3.5–5.3)
Sodium: 139 mEq/L (ref 135–145)

## 2010-12-31 LAB — IBC PANEL
%SAT: 8 % — ABNORMAL LOW (ref 20–55)
TIBC: 340 ug/dL (ref 250–470)
UIBC: 312 ug/dL (ref 125–400)

## 2010-12-31 LAB — TSH: TSH: 2.491 u[IU]/mL (ref 0.350–4.500)

## 2010-12-31 LAB — LDL CHOLESTEROL, DIRECT: Direct LDL: 112 mg/dL — ABNORMAL HIGH

## 2010-12-31 LAB — IRON: Iron: 28 ug/dL — ABNORMAL LOW (ref 42–145)

## 2011-01-01 ENCOUNTER — Ambulatory Visit: Payer: Medicare Other | Admitting: Family Medicine

## 2011-01-02 ENCOUNTER — Ambulatory Visit: Payer: Medicare Other | Admitting: Family Medicine

## 2011-01-02 ENCOUNTER — Telehealth: Payer: Self-pay | Admitting: Family Medicine

## 2011-01-02 NOTE — Telephone Encounter (Signed)
[name] missed her appointment today with me to discuss her lab results and to follow-up on her symptoms.  I tried calling her but there was no answer.  I am sending her a letter with her results showing iron-deficiency anemia and asking her to make an appointment to discuss this issue and to follow-up on her left arm symptoms.

## 2011-01-13 ENCOUNTER — Emergency Department (HOSPITAL_COMMUNITY)
Admission: EM | Admit: 2011-01-13 | Discharge: 2011-01-14 | Disposition: A | Discharge status: Home / Self Care | Payer: Medicare Other | Attending: Emergency Medicine | Admitting: Emergency Medicine

## 2011-01-13 DIAGNOSIS — I10 Essential (primary) hypertension: Secondary | ICD-10-CM | POA: Insufficient documentation

## 2011-01-13 DIAGNOSIS — W19XXXA Unspecified fall, initial encounter: Secondary | ICD-10-CM | POA: Insufficient documentation

## 2011-01-13 DIAGNOSIS — S5010XA Contusion of unspecified forearm, initial encounter: Secondary | ICD-10-CM | POA: Insufficient documentation

## 2011-01-13 DIAGNOSIS — Y92009 Unspecified place in unspecified non-institutional (private) residence as the place of occurrence of the external cause: Secondary | ICD-10-CM | POA: Insufficient documentation

## 2011-01-13 DIAGNOSIS — F329 Major depressive disorder, single episode, unspecified: Secondary | ICD-10-CM | POA: Insufficient documentation

## 2011-01-13 DIAGNOSIS — Z79899 Other long term (current) drug therapy: Secondary | ICD-10-CM | POA: Insufficient documentation

## 2011-01-13 DIAGNOSIS — F3289 Other specified depressive episodes: Secondary | ICD-10-CM | POA: Insufficient documentation

## 2011-01-21 ENCOUNTER — Encounter: Payer: Self-pay | Admitting: Family Medicine

## 2011-01-21 ENCOUNTER — Ambulatory Visit (INDEPENDENT_AMBULATORY_CARE_PROVIDER_SITE_OTHER): Payer: Medicare Other | Admitting: Family Medicine

## 2011-01-21 ENCOUNTER — Other Ambulatory Visit: Payer: Self-pay | Admitting: Urology

## 2011-01-21 VITALS — BP 100/78 | HR 104 | Temp 98.3°F | Ht 67.0 in | Wt 381.9 lb

## 2011-01-21 DIAGNOSIS — F419 Anxiety disorder, unspecified: Secondary | ICD-10-CM

## 2011-01-21 DIAGNOSIS — F411 Generalized anxiety disorder: Secondary | ICD-10-CM

## 2011-01-21 DIAGNOSIS — M1710 Unilateral primary osteoarthritis, unspecified knee: Secondary | ICD-10-CM

## 2011-01-21 DIAGNOSIS — R209 Unspecified disturbances of skin sensation: Secondary | ICD-10-CM

## 2011-01-21 DIAGNOSIS — R351 Nocturia: Secondary | ICD-10-CM

## 2011-01-21 DIAGNOSIS — R350 Frequency of micturition: Secondary | ICD-10-CM

## 2011-01-21 DIAGNOSIS — IMO0002 Reserved for concepts with insufficient information to code with codable children: Secondary | ICD-10-CM

## 2011-01-21 DIAGNOSIS — D509 Iron deficiency anemia, unspecified: Secondary | ICD-10-CM

## 2011-01-21 DIAGNOSIS — R202 Paresthesia of skin: Secondary | ICD-10-CM

## 2011-01-21 LAB — POCT URINALYSIS DIPSTICK
Bilirubin, UA: NEGATIVE
Glucose, UA: NEGATIVE
Ketones, UA: NEGATIVE
Leukocytes, UA: NEGATIVE
Nitrite, UA: NEGATIVE
Protein, UA: NEGATIVE
Spec Grav, UA: 1.025
Urobilinogen, UA: 1
pH, UA: 6.5

## 2011-01-21 LAB — RETICULOCYTES
ABS Retic: 81.1 10*3/uL (ref 19.0–186.0)
RBC.: 4.77 MIL/uL (ref 3.87–5.11)
Retic Ct Pct: 1.7 % (ref 0.4–2.3)

## 2011-01-21 LAB — POCT UA - MICROSCOPIC ONLY

## 2011-01-21 MED ORDER — ALPRAZOLAM 0.5 MG PO TABS: 1.0000 mg | ORAL_TABLET | Freq: Two times a day (BID) | ORAL | Status: DC | PRN

## 2011-01-21 MED ORDER — OMEPRAZOLE 40 MG PO CPDR: 40.0000 mg | DELAYED_RELEASE_CAPSULE | Freq: Every day | ORAL | Status: DC

## 2011-01-21 MED ORDER — FERROUS SULFATE 325 (65 FE) MG PO TABS: 325.0000 mg | ORAL_TABLET | Freq: Three times a day (TID) | ORAL | Status: DC

## 2011-01-21 MED ORDER — SERTRALINE HCL 100 MG PO TABS: 100.0000 mg | ORAL_TABLET | Freq: Every day | ORAL | Status: DC

## 2011-01-21 MED ORDER — MELOXICAM 15 MG PO TABS: 15.0000 mg | ORAL_TABLET | Freq: Every day | ORAL | Status: DC

## 2011-01-21 MED ORDER — BUTALBITAL-APAP-CAFFEINE 50-325-40 MG PO TABS: 1.0000 | ORAL_TABLET | Freq: Four times a day (QID) | ORAL | Status: DC | PRN

## 2011-01-21 MED ORDER — HYDROCHLOROTHIAZIDE 25 MG PO TABS: 25.0000 mg | ORAL_TABLET | Freq: Every day | ORAL | Status: DC

## 2011-01-21 MED ORDER — FEXOFENADINE HCL 180 MG PO TABS: 180.0000 mg | ORAL_TABLET | Freq: Every day | ORAL | Status: DC

## 2011-01-21 MED ORDER — CYCLOBENZAPRINE HCL 10 MG PO TABS: ORAL_TABLET | ORAL | Status: DC

## 2011-01-21 MED ORDER — HYDROCODONE-ACETAMINOPHEN 7.5-500 MG PO TABS: 1.0000 | ORAL_TABLET | Freq: Four times a day (QID) | ORAL | Status: DC | PRN

## 2011-01-21 MED ORDER — PROMETHAZINE HCL 25 MG PO TABS: ORAL_TABLET | ORAL | Status: DC

## 2011-01-21 MED ORDER — LISINOPRIL 20 MG PO TABS: ORAL_TABLET | ORAL | Status: DC

## 2011-01-21 MED ORDER — NORTRIPTYLINE HCL 25 MG PO CAPS: 25.0000 mg | ORAL_CAPSULE | Freq: Every day | ORAL | Status: DC

## 2011-01-21 NOTE — Patient Instructions (Signed)
It was nice to see you today!  I will contact Dr. Ermalene Searing office and send his office whatever he needs.  I will call you with your lab results.  I will see you in 6-8 weeks.  Take care! Morgin Halls M. Remy Dia, M.D.

## 2011-01-21 NOTE — Progress Notes (Signed)
  Subjective:    Patient ID: Crystal Garza, female    DOB: 1970/05/19, 40 y.o.   MRN: [id number]  HPI Pt is a 40 yo F presenting to the office today for a follow-up appointment.  1. Weight: She is currently in the process of getting lap band surgery and this is her last appointment before returning to see Dr. Daphine Deutscher and [facility]. Pt has not had a significant weight loss. 2. S/P fall: Pt had a fall recently and went to the ED. She also followed up with her orthopedist who told her she had "bone on bone" and recommended surgery. 3. Wrist pain: Pt was seen by Dr. Madolyn Frieze for left arm paraesthesias. Her ortho told her that was carpal tunnel and put her in a cock up splint. She has been doing well with that.   4. Lab review: Pt had some labs done recently and would like to review these. 5. Medication refills: Pt needs refills. She states she has not gotten any pain medication from her orthopedist since we have a pain contract. If she decides to get pain from him, we will not be able to give her any more pain medication. She also states that the Imitrex we gave her at her last visit has made her feel bad and has not prevented her headaches. She wants to go back on her Nortriptyline and Fioricet for migraines 6. Nocturia: Pt states she is up every night multiple times to urinate. She said she does not have that problem during the day, only at night, for about one year now. No signs of diabetes.   Review of Systems  Constitutional: Negative for fever and activity change.  HENT: Negative for congestion.   Respiratory: Negative for shortness of breath.   Cardiovascular: Negative for chest pain and leg swelling.  Gastrointestinal: Negative for abdominal pain.  Genitourinary: Positive for frequency.  Musculoskeletal: Positive for myalgias, back pain, joint swelling, arthralgias and gait problem.  Skin: Negative for rash.  Neurological: Negative for headaches.       Objective:   Physical Exam    Constitutional: She is oriented to person, place, and time. She appears well-developed and well-nourished. No distress.  HENT:  Head: Normocephalic and atraumatic.  Eyes: Pupils are equal, round, and reactive to light.  Neck: Normal range of motion. Neck supple.  Cardiovascular: Normal rate, regular rhythm and normal heart sounds.  Exam reveals no gallop and no friction rub.   No murmur heard. Pulmonary/Chest: Effort normal and breath sounds normal. She has no wheezes.  Abdominal: Soft. Bowel sounds are normal. There is no tenderness.       Morbidly obese  Musculoskeletal: She exhibits no edema.       Left wrist: She exhibits decreased range of motion and tenderness.       Left knee: She exhibits decreased range of motion, swelling, abnormal alignment and bony tenderness. tenderness found.  Neurological: She is alert and oriented to person, place, and time. No cranial nerve deficit.  Skin: Skin is warm and dry. No rash noted.  Psychiatric: She has a normal mood and affect.          Assessment & Plan:

## 2011-01-22 DIAGNOSIS — R351 Nocturia: Secondary | ICD-10-CM | POA: Insufficient documentation

## 2011-01-22 NOTE — Assessment & Plan Note (Signed)
Followed by ortho. We will continue to give pain medication for now since we have a pain contract.

## 2011-01-22 NOTE — Assessment & Plan Note (Signed)
Pt has not had significant weight loss. I will contact Dr. Ermalene Searing office to see what documentation he requires for her surgery. I feel this surgery would be very beneficial to her health. She is morbidly obese which severely affects her life. 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 With weight loss, she will be more mobile and be able to better use her joints. She also has depression/anxiety which I feel is exacerbated by her obesity.

## 2011-01-22 NOTE — Assessment & Plan Note (Signed)
History of iron deficiency anemia. Pt's blood count showed a high platelet, high WBC and normal RBC. If patient has iron def anemia, it could be that her bone marrow is revved up trying to keep up with RBC count. Otherwise, we should consider a referral to heme-onc for further investigation into her labs.

## 2011-01-22 NOTE — Assessment & Plan Note (Signed)
Carpal tunnel per ortho. In cock up splint.

## 2011-01-22 NOTE — Assessment & Plan Note (Signed)
Going on for one year. Will get UA to check for occult infection. Told her to avoid caffeine late in the day and decrease her fluid intake after dinner. Will continue to follow.

## 2011-02-16 ENCOUNTER — Telehealth: Payer: Self-pay | Admitting: Family Medicine

## 2011-02-16 NOTE — Telephone Encounter (Signed)
Pt calling about her urine culture - hurting in her stomach and wants to know what the results are.

## 2011-02-17 ENCOUNTER — Ambulatory Visit: Payer: Medicare Other | Admitting: Family Medicine

## 2011-02-17 NOTE — Telephone Encounter (Signed)
Unable to reach patient by telephone. Reviewed last UA with Dr. McDiarmid. No infection, nothing that should be causing her pain. If she is in a lot of pain and it is not related to her menstrual period, she should make an appointment for further evaluation.  Thank you! Crystal Garza M. Shubh Chiara, M.D.

## 2011-02-17 NOTE — Telephone Encounter (Signed)
Pt is still hurting and needs to know what to do.

## 2011-02-17 NOTE — Telephone Encounter (Signed)
Patient called back and scheduled an appointment with Dr. Adrian Blackwater for 1:30 this afternoon.

## 2011-02-20 ENCOUNTER — Other Ambulatory Visit: Payer: Self-pay | Admitting: Family Medicine

## 2011-02-20 ENCOUNTER — Ambulatory Visit (INDEPENDENT_AMBULATORY_CARE_PROVIDER_SITE_OTHER): Payer: Medicare Other | Admitting: Family Medicine

## 2011-02-20 ENCOUNTER — Encounter: Payer: Self-pay | Admitting: Family Medicine

## 2011-02-20 VITALS — BP 134/71 | HR 118 | Temp 98.0°F | Ht 67.0 in | Wt 376.0 lb

## 2011-02-20 DIAGNOSIS — N200 Calculus of kidney: Secondary | ICD-10-CM | POA: Insufficient documentation

## 2011-02-20 LAB — POCT URINALYSIS DIPSTICK
Bilirubin, UA: NEGATIVE
Glucose, UA: NEGATIVE
Nitrite, UA: POSITIVE
Protein, UA: 100
Spec Grav, UA: 1.02
Urobilinogen, UA: 0.2
pH, UA: 5.5

## 2011-02-20 LAB — POCT UA - MICROSCOPIC ONLY: WBC, Ur, HPF, POC: >20

## 2011-02-20 MED ORDER — CEPHALEXIN 500 MG PO CAPS: 500.0000 mg | ORAL_CAPSULE | Freq: Two times a day (BID) | ORAL | Status: AC

## 2011-02-20 NOTE — Progress Notes (Addendum)
  Subjective:    Patient ID: Crystal Garza, female    DOB: 12-Aug-1970, 40 y.o.   MRN: [id number]  HPI  Pt is a 40 yo female presenting with 5 day history of right flank and right lower abdominal pain. Past medical history is significant for nephrolithiasis in 2003 and hysterectomy in 2004. Pain started on Monday with flank pain. It continued to worsen and she felt pain radiating into her abdomen. On Wednesday, she began having urinary symptoms including hematuria, dysuria and frequency. She states she felt like she passed a kidney stone and had some relief. The pain returned by Thursday and it has not changed by today (Friday.) She has tried Lortab for her pain with some relief. Associated symptoms include nausea, vomiting, and urinary symptoms mentioned above. Pt denies fevers, chills, body aches or left sided abdominal/flank pain.   Review of Systems  Constitutional: Positive for fatigue. Negative for fever and chills.  Gastrointestinal: Positive for nausea, vomiting and abdominal pain. Negative for diarrhea, constipation and blood in stool.  Genitourinary: Positive for urgency, frequency, hematuria, flank pain and decreased urine volume.  Skin: Negative for rash.  All other systems reviewed and are negative.       Objective:   Physical Exam  Constitutional: She is oriented to person, place, and time. She appears well-developed and well-nourished.  Cardiovascular: Normal rate and regular rhythm.   Pulmonary/Chest: Effort normal and breath sounds normal.  Abdominal: Soft. Bowel sounds are normal. There is tenderness in the right lower quadrant. There is CVA tenderness (on right).  Neurological: She is alert and oriented to person, place, and time.          Assessment & Plan:

## 2011-02-20 NOTE — Progress Notes (Deleted)
  Subjective:    Patient ID: Crystal Garza, female    DOB: [birth year], 40 y.o.   MRN: [id number]  HPI    Review of Systems     Objective:   Physical Exam        Assessment & Plan:

## 2011-02-20 NOTE — Assessment & Plan Note (Signed)
Given her history of kidney stones, this is likely a recurrence. At this time, she does not appear toxically ill. Encouraged her to drink plenty of fluids, and take her Lortab as needed for pain. Will check UA today for infection as well as hematuria. Will send for urine culture, if needed. If there is an infection noted, I will call patient and call antibiotic to [facility] drug. If patient is still having intense pain by middle of next week, she will return to clinic. At that time we can consider doing imaging studies and further evaluation into the stone. She has had a hysterectomy so no concern for tubal pregnancy. If pain continues and her stone studies are negative, we will then do pelvic exam and do further evaluations for PID, colonic obstruction, appendicitis, other abdominal infections.

## 2011-02-20 NOTE — Progress Notes (Signed)
  Subjective:    Patient ID: Crystal Garza, female    DOB: [birth year], 40 y.o.   MRN: [id number]  HPI    Review of Systems     Objective:   Physical Exam        Assessment & Plan:    **Addendum: Patient's urine was found to have >20WBC on microscopy. Will call in Bactrim to [facility] drug and send urine for culture.  Keyoni Lapinski M. Charlayne Vultaggio, M.D.

## 2011-02-20 NOTE — Progress Notes (Signed)
Urine culture pending. Discussed with Dr. Deirdre Priest. Feels Keflex would be better than previous discussed Bactrim. Sent to Keflex. Pt called and is aware of plan. Told of red flag symptoms that should prompt her to be seen by emergency department.  Chanson Teems M. Anthony Tamburo, M.D.

## 2011-02-20 NOTE — Patient Instructions (Signed)
It was nice to see you today. I am sorry you aren't feeling well.  Please stop at the lab to give urine sample.  I will call you if it is abnormal, and call in a prescription if needed.  Continue to take pain medication and drink plenty of fluids. Come back if your pain is not better by mid-week.  We can do imaging studies at that time, if needed.  See you soon! Nyana Haren M. Sharika Mosquera, M.D.

## 2011-02-20 NOTE — Progress Notes (Signed)
Addended by: Hilarie Fredrickson on: 02/20/2011 03:02 PM   Modules accepted: Orders

## 2011-02-20 NOTE — Progress Notes (Signed)
Addended by: [place], Jsoeph Podesta on: 02/20/2011 03:04 PM   Modules accepted: Orders

## 2011-02-23 LAB — URINE CULTURE: Colony Count: >=100000

## 2011-02-24 ENCOUNTER — Telehealth: Payer: Self-pay | Admitting: Family Medicine

## 2011-02-24 NOTE — Telephone Encounter (Signed)
Urine culture result was E.coli, pansensitive. On Keflex, pt feeling better.  Amber M. Hairford, M.D.

## 2011-03-13 ENCOUNTER — Other Ambulatory Visit: Payer: Self-pay | Admitting: Family Medicine

## 2011-03-13 MED ORDER — HYDROCODONE-ACETAMINOPHEN 7.5-500 MG PO TABS: 1.0000 | ORAL_TABLET | Freq: Four times a day (QID) | ORAL | Status: DC | PRN

## 2011-03-13 NOTE — Telephone Encounter (Signed)
To MD

## 2011-03-13 NOTE — Telephone Encounter (Signed)
Called in verbal Rx for Lortab to Pt. Pharmacy.  Pt aware that she needs an OV for future refills and verbalized understanding.  Altamese Dilling, RN

## 2011-03-13 NOTE — Telephone Encounter (Signed)
[name] is calling because she said that [facility] has faxed over refill requests for Hydrocodone.  She was last seen in October so she doesn't know what the problem is.  She would like a call back to let her know if she needs to be seen or if the medication has been sent to the Pharmacy.

## 2011-05-03 ENCOUNTER — Emergency Department (INDEPENDENT_AMBULATORY_CARE_PROVIDER_SITE_OTHER)
Admission: EM | Admit: 2011-05-03 | Discharge: 2011-05-03 | Disposition: A | Discharge status: Home / Self Care | Payer: Medicare Other | Source: Home / Self Care | Attending: Emergency Medicine | Admitting: Emergency Medicine

## 2011-05-03 ENCOUNTER — Emergency Department (INDEPENDENT_AMBULATORY_CARE_PROVIDER_SITE_OTHER): Payer: Medicare Other

## 2011-05-03 ENCOUNTER — Encounter (HOSPITAL_COMMUNITY): Payer: Self-pay

## 2011-05-03 DIAGNOSIS — J329 Chronic sinusitis, unspecified: Secondary | ICD-10-CM

## 2011-05-03 DIAGNOSIS — J40 Bronchitis, not specified as acute or chronic: Secondary | ICD-10-CM

## 2011-05-03 HISTORY — DX: Dorsalgia, unspecified: M54.9

## 2011-05-03 HISTORY — DX: Pain in unspecified knee: M25.569

## 2011-05-03 HISTORY — DX: Other chronic pain: G89.29

## 2011-05-03 MED ORDER — PSEUDOEPHEDRINE-GUAIFENESIN ER 120-1200 MG PO TB12: 1.0000 | ORAL_TABLET | Freq: Two times a day (BID) | ORAL | Status: DC

## 2011-05-03 MED ORDER — ALBUTEROL SULFATE HFA 108 (90 BASE) MCG/ACT IN AERS: 1.0000 | INHALATION_SPRAY | Freq: Four times a day (QID) | RESPIRATORY_TRACT | Status: DC | PRN

## 2011-05-03 MED ORDER — FLUTICASONE PROPIONATE 50 MCG/ACT NA SUSP: 2.0000 | Freq: Every day | NASAL | Status: DC

## 2011-05-03 MED ORDER — DOXYCYCLINE HYCLATE 100 MG PO CAPS: 100.0000 mg | ORAL_CAPSULE | Freq: Two times a day (BID) | ORAL | Status: AC

## 2011-05-03 NOTE — ED Provider Notes (Signed)
History     CSN: [id number]  Arrival date & time 05/03/11  1225   First MD Initiated Contact with Patient 05/03/11 1235      Chief Complaint  Patient presents with  . Cough     HPI Comments: HPI : Pt with cough, wheeze, sinus pressure/pain x 1 week. intially with fever tmax 101.8 but pt started herself on keflex which she took for "several days" last dose 2 days ago. States fever has since resolved. Pt states now has cough productive of thick yellow mucous, increasing SOB, and purulent nasal discharge. Unable to sleep at night secondary to coughing. No N/V, post tussive emesis.     Patient is a 40 y.o. female presenting with cough. The history is provided by the patient.  Cough This is a new problem. The current episode started more than 2 days ago. The cough is productive of sputum. The maximum temperature recorded prior to her arrival was 101 to 101.9 F. Associated symptoms include headaches, rhinorrhea, myalgias, shortness of breath and wheezing. Pertinent negatives include no chest pain and no ear pain.    Past Medical History  Diagnosis Date  . Anemia     iron def  . Hypertension   . Morbid obesity   . GERD (gastroesophageal reflux disease)   . Anxiety   . Depression   . Migraine   . Back pain   . Chronic knee pain     Past Surgical History  Procedure Date  . Abdominal hysterectomy   . Knee arthroscopy     History reviewed. No pertinent family history.  History  Substance Use Topics  . Smoking status: Never Smoker   . Smokeless tobacco: Not on file  . Alcohol Use: No    OB History    Grav Para Term Preterm Abortions TAB SAB Ect Mult Living                  Review of Systems  Constitutional: Positive for fever and fatigue.  HENT: Positive for rhinorrhea and sinus pressure. Negative for ear pain and facial swelling.   Respiratory: Positive for cough, shortness of breath and wheezing.   Cardiovascular: Negative for chest pain.  Gastrointestinal:  Negative for abdominal pain.  Musculoskeletal: Positive for myalgias.  Neurological: Positive for headaches.    Allergies  Codeine  Home Medications   Current Outpatient Rx  Name Route Sig Dispense Refill  . ALBUTEROL SULFATE HFA 108 (90 BASE) MCG/ACT IN AERS Inhalation Inhale 1-2 puffs into the lungs every 6 (six) hours as needed for wheezing. 1 Inhaler 0  . ALPRAZOLAM 0.5 MG PO TABS Oral Take 2 tablets (1 mg total) by mouth 2 (two) times daily as needed for sleep. 60 tablet 3  . BUTALBITAL-APAP-CAFFEINE 50-325-40 MG PO TABS Oral Take 1-2 tablets by mouth every 6 (six) hours as needed for headache. 20 tablet 0  . CYCLOBENZAPRINE HCL 10 MG PO TABS  1 by mouth q 8hrs as needed spasms 30 tablet 1  . DOXYCYCLINE HYCLATE 100 MG PO CAPS Oral Take 1 capsule (100 mg total) by mouth 2 (two) times daily. X 7 days 14 capsule 0  . FERROUS SULFATE 325 (65 FE) MG PO TABS Oral Take 1 tablet (325 mg total) by mouth 3 (three) times daily with meals. 1 by mouth two times a day for anemia 90 tablet 12  . FEXOFENADINE HCL 180 MG PO TABS Oral Take 1 tablet (180 mg total) by mouth daily. 1 by mouth Daily as  needed allergy 30 tablet 5  . FLUTICASONE PROPIONATE 50 MCG/ACT NA SUSP Nasal Place 2 sprays into the nose daily. 16 g 0  . HYDROCHLOROTHIAZIDE 25 MG PO TABS Oral Take 1 tablet (25 mg total) by mouth daily. Take 1 tab by mouth every morning 90 tablet 2  . HYDROCODONE-ACETAMINOPHEN 7.5-500 MG PO TABS Oral Take 1 tablet by mouth every 6 (six) hours as needed. 60 tablet 2  . LISINOPRIL 20 MG PO TABS  1 tab by mouth daily 30 tablet 6  . MELOXICAM 15 MG PO TABS Oral Take 1 tablet (15 mg total) by mouth daily. 30 tablet 2  . NORTRIPTYLINE HCL 25 MG PO CAPS Oral Take 1 capsule (25 mg total) by mouth at bedtime. 30 capsule 2  . NORTRIPTYLINE HCL 50 MG PO CAPS Oral Take 1 capsule (50 mg total) by mouth at bedtime. While you are experiencing the arm pain, take 1 capsule of 50mg  of nortriptyline daily at bedtime. You  may resume your regular 25mg  dose after you arm pain resolves. 30 capsule 0  . OMEPRAZOLE 40 MG PO CPDR Oral Take 1 capsule (40 mg total) by mouth daily. 30 capsule 5  . PROMETHAZINE HCL 25 MG PO TABS  one tab by mouth q6 hours as needed for nausea/vomiting 30 tablet 6  . PSEUDOEPHEDRINE-GUAIFENESIN [id number] MG PO TB12 Oral Take 1 tablet by mouth 2 (two) times daily. 20 each 0  . SERTRALINE HCL 100 MG PO TABS Oral Take 1 tablet (100 mg total) by mouth daily. Take 1 1/2 tablet daily 45 tablet 5  . SUMATRIPTAN SUCCINATE 50 MG PO TABS Oral Take 1 tablet (50 mg total) by mouth once as needed for migraine. 30 tablet 2  . TRIAMCINOLONE ACETONIDE 0.1 % EX OINT Topical Apply topically 2 (two) times daily. 30 g 0    BP 101/53  Pulse 88  Temp(Src) 98.9 F (37.2 C) (Oral)  Resp 18  SpO2 100%  Physical Exam  Nursing note and vitals reviewed. Constitutional: She is oriented to person, place, and time. She appears well-developed and well-nourished.  HENT:  Head: Normocephalic and atraumatic.  Right Ear: Tympanic membrane and ear canal normal.  Left Ear: Tympanic membrane and ear canal normal.  Nose: Mucosal edema and rhinorrhea present. Right sinus exhibits maxillary sinus tenderness and frontal sinus tenderness. Left sinus exhibits maxillary sinus tenderness and frontal sinus tenderness.  Mouth/Throat: Uvula is midline, oropharynx is clear and moist and mucous membranes are normal.  Eyes: Conjunctivae and EOM are normal. Pupils are equal, round, and reactive to light.  Neck: Normal range of motion. Neck supple.  Cardiovascular: Normal rate, regular rhythm and normal heart sounds.   Pulmonary/Chest: Effort normal. No respiratory distress. She has wheezes. She has rhonchi in the left middle field and the left lower field. She has no rales.  Abdominal: She exhibits no distension. There is no tenderness. There is no rebound and no guarding.  Musculoskeletal: Normal range of motion.  Lymphadenopathy:     She has no cervical adenopathy.  Neurological: She is alert and oriented to person, place, and time.  Skin: Skin is warm and dry. No rash noted.  Psychiatric: She has a normal mood and affect. Her behavior is normal. Judgment and thought content normal.    ED Course  Procedures (including critical care time)  Labs Reviewed - No data to display Dg Chest 2 View  05/03/2011  *RADIOLOGY REPORT*  Clinical Data:  Cough and fever.  CHEST - 2 VIEW  Comparison: 12/06/2009  Findings: The heart size and mediastinal contours are within normal limits.  Both lungs are clear.  The visualized skeletal structures are unremarkable.  IMPRESSION: No active disease.  Original Report Authenticated By: Reola Calkins, M.D.     1. Sinusitis   2. Bronchitis      MDM  Pt with asymmetric breath sounds. intially sounds to be flu like illness with now secondary infection. Likely partially tx'd herself with keflex. Will check cxr to r/o PNA. If neg will tx as bronchitis but will cover for sinusitis. Pt will use her lortab at night to help with coughing.   Luiz Blare, MD 05/03/11 [phone]

## 2011-05-03 NOTE — ED Notes (Signed)
Pt has cough, headache and rt sided earache since Tuesday

## 2011-05-11 ENCOUNTER — Ambulatory Visit (INDEPENDENT_AMBULATORY_CARE_PROVIDER_SITE_OTHER): Payer: Medicare Other | Admitting: Family Medicine

## 2011-05-11 ENCOUNTER — Encounter: Payer: Self-pay | Admitting: Family Medicine

## 2011-05-11 VITALS — BP 135/83 | HR 103 | Temp 98.0°F | Ht 67.0 in | Wt 372.0 lb

## 2011-05-11 DIAGNOSIS — R197 Diarrhea, unspecified: Secondary | ICD-10-CM | POA: Insufficient documentation

## 2011-05-11 LAB — LIPASE: Lipase: 31 U/L (ref 0–75)

## 2011-05-11 LAB — COMPREHENSIVE METABOLIC PANEL
ALT: 8 U/L (ref 0–35)
AST: 14 U/L (ref 0–37)
Albumin: 3.7 g/dL (ref 3.5–5.2)
Alkaline Phosphatase: 60 U/L (ref 39–117)
BUN: 9 mg/dL (ref 6–23)
CO2: 26 mEq/L (ref 19–32)
Calcium: 9.5 mg/dL (ref 8.4–10.5)
Chloride: 99 mEq/L (ref 96–112)
Creat: 0.77 mg/dL (ref 0.50–1.10)
Glucose, Bld: 78 mg/dL (ref 70–99)
Potassium: 4.2 mEq/L (ref 3.5–5.3)
Sodium: 139 mEq/L (ref 135–145)
Total Bilirubin: 0.5 mg/dL (ref 0.3–1.2)
Total Protein: 7.6 g/dL (ref 6.0–8.3)

## 2011-05-11 LAB — CBC
HCT: 38 % (ref 36.0–46.0)
Hemoglobin: 11.6 g/dL — ABNORMAL LOW (ref 12.0–15.0)
MCH: 24.6 pg — ABNORMAL LOW (ref 26.0–34.0)
MCHC: 30.5 g/dL (ref 30.0–36.0)
MCV: 80.7 fL (ref 78.0–100.0)
Platelets: 574 10*3/uL — ABNORMAL HIGH (ref 150–400)
RBC: 4.71 MIL/uL (ref 3.87–5.11)
RDW: 16.4 % — ABNORMAL HIGH (ref 11.5–15.5)
WBC: 14.8 10*3/uL — ABNORMAL HIGH (ref 4.0–10.5)

## 2011-05-11 MED ORDER — ONDANSETRON HCL 4 MG PO TABS: 4.0000 mg | ORAL_TABLET | Freq: Three times a day (TID) | ORAL | Status: DC | PRN

## 2011-05-11 NOTE — Assessment & Plan Note (Addendum)
Diarrhea and central abdominal pain. Both are mild. I think this is likely self resolving. I will check a cmet, lipase, CBC because she does have some central abdominal pain. Asked her to return in one week if she still having diarrhea or return earlier if she has any blood, increased pain, worsening diarrhea.

## 2011-05-11 NOTE — Progress Notes (Signed)
  Subjective:    Patient ID: Crystal Garza, female    DOB: [birth year], 41 y.o.   MRN: [id number]  HPI  Patient presents today with an nausea x4 days. She's also having diarrhea. She states that before Christmas she was sick with bronchitis. She was on Mucinex for this, she did not have any antibiotics. She states that she has some cramping in her stomach before she goes to bathroom, seems to go the bathroom after anything she eats. She has not had any vomiting. She has not seen any blood or any dark stools. Her stools alternate between being soft and being loose. She does not have diarrhea at night. She denies any fevers. She has not had any sick contacts.  Review of Systems Denies chest pain, shortness of breath, fever    Objective:   Physical Exam  Vital signs reviewed General appearance - alert, well appearing, and in no distress and oriented to person, place, and time Heart - normal rate, regular rhythm, normal S1, S2, no murmurs, rubs, clicks or gallops Chest - clear to auscultation, no wheezes, rales or rhonchi, symmetric air entry, no tachypnea, retractions or cyanosis Abdomen-obese, tender to palpation centrally and in the epigastric area. No rebound tenderness, no guarding      Assessment & Plan:

## 2011-05-11 NOTE — Patient Instructions (Signed)
Please take the Zofran for nausea. Continue the omeprazole Call if you have fever or if you have any diarrhea with blood Come back in one week if you're still having diarrhea. Hopefully this will get better on its own.

## 2011-06-01 ENCOUNTER — Encounter: Payer: Self-pay | Admitting: Family Medicine

## 2011-06-01 ENCOUNTER — Ambulatory Visit (INDEPENDENT_AMBULATORY_CARE_PROVIDER_SITE_OTHER): Payer: Medicare Other | Admitting: Family Medicine

## 2011-06-01 VITALS — BP 156/77 | HR 137 | Temp 98.0°F | Ht 67.0 in | Wt 375.0 lb

## 2011-06-01 DIAGNOSIS — R1033 Periumbilical pain: Secondary | ICD-10-CM

## 2011-06-01 DIAGNOSIS — Z23 Encounter for immunization: Secondary | ICD-10-CM

## 2011-06-01 DIAGNOSIS — M1710 Unilateral primary osteoarthritis, unspecified knee: Secondary | ICD-10-CM

## 2011-06-01 DIAGNOSIS — F411 Generalized anxiety disorder: Secondary | ICD-10-CM

## 2011-06-01 DIAGNOSIS — R109 Unspecified abdominal pain: Secondary | ICD-10-CM

## 2011-06-01 DIAGNOSIS — F419 Anxiety disorder, unspecified: Secondary | ICD-10-CM

## 2011-06-01 DIAGNOSIS — M25569 Pain in unspecified knee: Secondary | ICD-10-CM

## 2011-06-01 DIAGNOSIS — IMO0002 Reserved for concepts with insufficient information to code with codable children: Secondary | ICD-10-CM

## 2011-06-01 MED ORDER — TRIAMCINOLONE ACETONIDE 0.1 % EX OINT: TOPICAL_OINTMENT | Freq: Two times a day (BID) | CUTANEOUS | Status: DC

## 2011-06-01 MED ORDER — HYDROCODONE-ACETAMINOPHEN 7.5-500 MG PO TABS: 1.0000 | ORAL_TABLET | Freq: Four times a day (QID) | ORAL | Status: DC | PRN

## 2011-06-01 MED ORDER — BUTALBITAL-APAP-CAFFEINE 50-325-40 MG PO TABS: 1.0000 | ORAL_TABLET | Freq: Four times a day (QID) | ORAL | Status: DC | PRN

## 2011-06-01 MED ORDER — ALPRAZOLAM 0.5 MG PO TABS: 1.0000 mg | ORAL_TABLET | Freq: Two times a day (BID) | ORAL | Status: DC | PRN

## 2011-06-01 NOTE — Patient Instructions (Addendum)
Thanks for coming in today!!  I have refilled your prescriptions.  Also, I would like to get a CT scan of your abdomen to evaluate before sending to GI.  Please call your GI doctor and make an appointment to be seen within the next 1-2 weeks, if possible.  Also, if Dr. Daphine Deutscher has any questions, please ask him to contact the office!  Take care! Kuulei Kleier M. Neely Kammerer, M.D.

## 2011-06-02 DIAGNOSIS — R109 Unspecified abdominal pain: Secondary | ICD-10-CM | POA: Insufficient documentation

## 2011-06-02 DIAGNOSIS — R1084 Generalized abdominal pain: Secondary | ICD-10-CM | POA: Insufficient documentation

## 2011-06-02 DIAGNOSIS — Z23 Encounter for immunization: Secondary | ICD-10-CM | POA: Insufficient documentation

## 2011-06-02 NOTE — Assessment & Plan Note (Signed)
Patient with a three-week history of abdominal pain. Pain initially started in the epigastrium. The patient had related vomiting, and diarrhea. She was evaluated in the family practice Center at that time. The pain continued and actually worsened. She had change in bowel habits to constipation. She is followed by GI and would recommend for her to see her GI specialist soon. Given her father's colon cancer, diagnosed at age 43 as well as her current symptoms. I am getting a CT scan to evaluate her abdomen. If patient has increased pain, does not pass gas or stool, or notices blood in her stool, she will go to the emergency room to be evaluated

## 2011-06-02 NOTE — Assessment & Plan Note (Signed)
She continues to have multiple social stressors that require her to take a low-dose benzo. I have not refilled this medication in a while and I do not feel that she is abusing it. We'll prescribe Xanax 0.5 mg daily as needed for anxiety. Continue to encourage patient to deal with her stressors, and seek help as needed.

## 2011-06-02 NOTE — Progress Notes (Signed)
  Subjective:    Patient ID: Crystal Garza, female    DOB: 1971-04-27, 41 y.o.   MRN: [id number]  HPI  Patient is here for followup today. She would like refills on her medications. She would also like to discuss her abdominal pain.  1. Chronic Knee Pain- Likely secondary to her body habitus, patient has had chronic pain issues for a long time. She takes Lortab 7.5/500 as needed for pain. She has been working on getting Lapband surgery, and states that Dr. Daphine Deutscher is working on getting this set up. With her weight loss, I anticipate that she will need decreased amounts of narcotics. She states her pain interferes with her daily life. She is not able to get around secondary to knee pain as well as back pain. No increased redness of joints, or increased swelling. No fevers.  2. Anxiety- Patient has many social stressors including taking care of a sick granddaughter. She does well coping with her stressors, but does require Xanax 0.5mg  occasionally. She has met with Dr. Pascal Lux in the past. She is not interested in therapy at this time.  3. Abd pain- Patient's biggest concern today is her abdominal pain. She was seen in this clinic 2 weeks ago for N/V/D and was diagnosed with gastroenteritis. She was given Zofran which made her feel worse. She states that since then, she has not developed constipation and continues to have nausea. She had a colonoscopy in 2011 which was wnl. She sees [facility]. Her father was diagnosed with colon cancer at age 84. Her pain is superior to umbilicus, but not in epigastric region. She states it is a 5/10 constantly. Nothing makes it better or worse. She denies any vomiting. She has not noticed any blood in her stool, but she states her stool is now firm and difficult to have bowel movements. She does report having colitis in the past and this pain is very similar. She has tried increased doses of Prilosec with no relief.   Review of Systems Please see HPI above      Objective:   Physical Exam  Constitutional: She appears well-developed and well-nourished.  HENT:  Head: Normocephalic and atraumatic.  Cardiovascular: Normal rate, regular rhythm and normal heart sounds.   No murmur heard. Pulmonary/Chest: No respiratory distress. She has no wheezes.  Abdominal:       Exam limited by body habitus. TTP around umbilicus. No rebound or guarding. Abdomen obese but soft. No masses palpated.  Musculoskeletal: Normal range of motion. She exhibits tenderness (Knees bilaterally). She exhibits no edema.  Lymphadenopathy:    She has no cervical adenopathy.          Assessment & Plan:

## 2011-06-03 NOTE — Assessment & Plan Note (Signed)
Progress toward pain control and promoting increased function:  Patient awaiting Lapband. Weight loss will help with pain. Plan to eventually D/C narcotics. Current Non Narcotic Therapies: Heat, Mobic Risk Assessment for Opioid Therapy     Asking for early refills; frequent telephone calls or lost prescriptions no   Narcotics from other health providers: no Urine Drug Screen reviewed: no [facility] reviewed: no  Will discuss decreasing to 5/500 at next visit. I addressed this with Crystal Garza, but she states she needs 7.5 now for abd pain she is currently experiencing.

## 2011-06-05 ENCOUNTER — Ambulatory Visit (HOSPITAL_COMMUNITY)
Admission: RE | Admit: 2011-06-05 | Discharge: 2011-06-05 | Disposition: A | Discharge status: Home / Self Care | Payer: Medicare HMO | Source: Ambulatory Visit | Attending: Family Medicine | Admitting: Family Medicine

## 2011-06-05 DIAGNOSIS — R109 Unspecified abdominal pain: Secondary | ICD-10-CM | POA: Insufficient documentation

## 2011-06-05 MED ORDER — IOHEXOL 300 MG/ML  SOLN
100.0000 mL | Freq: Once | INTRAMUSCULAR | Status: AC | PRN
  Administered 2011-06-05: 100 mL via INTRAVENOUS

## 2011-06-09 ENCOUNTER — Telehealth: Payer: Self-pay | Admitting: Family Medicine

## 2011-06-09 NOTE — Telephone Encounter (Signed)
Spoke with patient. Told her the CT scan was normal. She feels better except when she is eating spicy food. Advised her to avoid spicy foods, and continue taking Prilosec. She can make an appointment with her GI doctor if symptoms worsen.  Katherine Syme M. Draysen Weygandt, M.D.

## 2011-06-09 NOTE — Telephone Encounter (Signed)
Requesting CT results.

## 2011-07-02 ENCOUNTER — Ambulatory Visit (INDEPENDENT_AMBULATORY_CARE_PROVIDER_SITE_OTHER): Payer: Medicare HMO | Admitting: General Surgery

## 2011-07-08 ENCOUNTER — Encounter (INDEPENDENT_AMBULATORY_CARE_PROVIDER_SITE_OTHER): Payer: Self-pay | Admitting: Surgery

## 2011-07-08 ENCOUNTER — Ambulatory Visit (INDEPENDENT_AMBULATORY_CARE_PROVIDER_SITE_OTHER): Payer: Medicare HMO | Admitting: Surgery

## 2011-07-08 NOTE — Progress Notes (Signed)
Chief Complaint:  Morbid obesity BMI 60  History of Present Illness:  Crystal Garza is an 41 y.o. 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 Crystal Garza is one of our most successful lapband recipients.  Crystal Garza is followed in the common family practice section. She comes in with her recommendations and discussions with many diets that she's been on current lose weight. Her weights over the last 5 years been documented in her BMI only fluctuates between 59 and 60.  Past Medical History  Diagnosis Date  . Anemia     iron def  . Hypertension   . Morbid obesity   . GERD (gastroesophageal reflux disease)   . Anxiety   . Depression   . Migraine   . Back pain   . Chronic knee pain     Past Surgical History  Procedure Date  . Abdominal hysterectomy   . Knee arthroscopy   . Cesarean section 05/1991    Current Outpatient Prescriptions  Medication Sig Dispense Refill  . ALPRAZolam (XANAX) 0.5 MG tablet Take 2 tablets (1 mg total) by mouth 2 (two) times daily as needed for sleep.  60 tablet  3  . butalbital-acetaminophen-caffeine (FIORICET) 50-325-40 MG per tablet Take 1-2 tablets by mouth every 6 (six) hours as needed for headache.  20 tablet  0  . cyclobenzaprine (FLEXERIL) 10 MG tablet 1 by mouth q 8hrs as needed spasms  30 tablet  1  . fexofenadine (ALLEGRA) 180 MG tablet Take 1 tablet (180 mg total) by mouth daily. 1 by mouth Daily as needed allergy  30 tablet  5  . fluticasone (FLONASE) 50 MCG/ACT nasal spray Place 2 sprays into the nose daily.  16 g  0  . hydrochlorothiazide (HYDRODIURIL) 25 MG tablet Take 1 tablet (25 mg total) by mouth daily. Take 1 tab by mouth every morning  90 tablet  2  . HYDROcodone-acetaminophen (LORTAB) 7.5-500 MG per tablet Take 1 tablet by mouth every 6 (six) hours as needed.  60 tablet  2  . lisinopril (PRINIVIL,ZESTRIL) 20 MG tablet 1 tab by mouth daily   30 tablet  6  . nortriptyline (PAMELOR) 25 MG capsule Take 1 capsule (25 mg total) by mouth at bedtime.  30 capsule  2  . omeprazole (PRILOSEC) 40 MG capsule Take 1 capsule (40 mg total) by mouth daily.  30 capsule  5  . promethazine (PHENERGAN) 25 MG tablet one tab by mouth q6 hours as needed for nausea/vomiting  30 tablet  6  . sertraline (ZOLOFT) 100 MG tablet Take 1 tablet (100 mg total) by mouth daily. Take 1 1/2 tablet daily  45 tablet  5  . triamcinolone ointment (KENALOG) 0.1 % Apply topically 2 (two) times daily.  30 g  0   Codeine Family History  Problem Relation Age of Onset  . Diabetes Mother   . Cancer Father     brain tumor, colon  . Diabetes Father   . Heart disease Paternal Uncle   . Diabetes Maternal Grandmother   . Heart disease Paternal Grandmother   . Heart disease Paternal Grandfather    Social History:   reports that she has never smoked. She does not have any smokeless tobacco history on file. She reports that she does not drink alcohol or use illicit drugs.   REVIEW OF SYSTEMS - PERTINENT POSITIVES ONLY: Denies DVT although she  has some problems with her left knee but weighs to much for a total of the replacement.  Physical Exam:   Blood pressure 154/96, pulse 104, temperature 97.6 F (36.4 C), temperature source Temporal, resp. rate 18, height 5\' 7"  (1.702 m), weight 383 lb 12.8 oz (174.091 kg). Body mass index is 60.11 kg/(m^2).  Gen:  WDWN  African American female NAD  Neurological: Alert and oriented to person, place, and time. Motor and sensory function is grossly intact  Head: Normocephalic and atraumatic.  Eyes: Conjunctivae are normal. Pupils are equal, round, and reactive to light. No scleral icterus.  Neck: Normal range of motion. Neck supple. No tracheal deviation or thyromegaly present.  Cardiovascular:  SR without murmurs or gallops.  No carotid bruits Respiratory: Effort normal.  No respiratory distress. No chest wall tenderness. Breath sounds  normal.  No wheezes, rales or rhonchi.  Abdomen:  Obese nontender GU: Musculoskeletal: Normal range of motion. Extremities are nontender. No cyanosis, edema or clubbing noted Lymphadenopathy: No cervical, preauricular, postauricular or axillary adenopathy is present Skin: Skin is warm and dry. No rash noted. No diaphoresis. No erythema. No pallor. Pscyh: Normal mood and affect. Behavior is normal. Judgment and thought content normal.   LABORATORY RESULTS: No results found for this or any previous visit (from the past 48 hour(s)).  RADIOLOGY RESULTS: No results found.  Problem List: Patient Active Problem List  Diagnoses  . Morbid obesity  . DEPRESSION, MAJOR, RECURRENT  . MIGRAINE, CHRONIC  . HYPERTENSION, BENIGN SYSTEMIC  . RHINITIS, ALLERGIC  . GASTROESOPHAGEAL REFLUX, NO ESOPHAGITIS  . OSTEOARTHRITIS, KNEES, BILATERAL  . Iron deficiency anemia  . Anxiety  . Back pain  . Eczematous dermatitis  . Gastroenteritis  . Arm paresthesia, left  . Nocturia  . Nephrolithiasis  . Diarrhea  . Abdominal pain  . Need for prophylactic vaccination and inoculation against influenza    Assessment & Plan: Morbid obesity with a good knowledge of lap band. History of multiple attempts at weight loss no unsuccessful. Begin journey toward lapband.   [name], MD, [facility] [phone] beeper [phone]  07/08/2011 4:58 PM

## 2011-07-08 NOTE — Progress Notes (Signed)
Addended by: Latricia Heft on: 07/08/2011 05:18 PM   Modules accepted: Orders

## 2011-07-09 ENCOUNTER — Other Ambulatory Visit (INDEPENDENT_AMBULATORY_CARE_PROVIDER_SITE_OTHER): Payer: Self-pay | Admitting: General Surgery

## 2011-07-10 ENCOUNTER — Other Ambulatory Visit (INDEPENDENT_AMBULATORY_CARE_PROVIDER_SITE_OTHER): Payer: Self-pay | Admitting: Surgery

## 2011-07-10 DIAGNOSIS — D249 Benign neoplasm of unspecified breast: Secondary | ICD-10-CM

## 2011-07-17 ENCOUNTER — Other Ambulatory Visit: Payer: Self-pay

## 2011-07-17 ENCOUNTER — Ambulatory Visit (HOSPITAL_COMMUNITY)
Admission: RE | Admit: 2011-07-17 | Discharge: 2011-07-17 | Disposition: A | Discharge status: Home / Self Care | Payer: Medicare HMO | Source: Ambulatory Visit | Attending: Surgery | Admitting: Surgery

## 2011-07-17 ENCOUNTER — Other Ambulatory Visit (INDEPENDENT_AMBULATORY_CARE_PROVIDER_SITE_OTHER): Payer: Self-pay | Admitting: Surgery

## 2011-07-17 DIAGNOSIS — M1990 Unspecified osteoarthritis, unspecified site: Secondary | ICD-10-CM | POA: Insufficient documentation

## 2011-07-17 DIAGNOSIS — Z13820 Encounter for screening for osteoporosis: Secondary | ICD-10-CM | POA: Insufficient documentation

## 2011-07-17 DIAGNOSIS — Z6841 Body Mass Index (BMI) 40.0 and over, adult: Secondary | ICD-10-CM | POA: Insufficient documentation

## 2011-07-17 DIAGNOSIS — I10 Essential (primary) hypertension: Secondary | ICD-10-CM | POA: Insufficient documentation

## 2011-07-17 DIAGNOSIS — M79609 Pain in unspecified limb: Secondary | ICD-10-CM | POA: Insufficient documentation

## 2011-07-20 ENCOUNTER — Ambulatory Visit (HOSPITAL_COMMUNITY)
Admission: RE | Admit: 2011-07-20 | Discharge: 2011-07-20 | Disposition: A | Discharge status: Home / Self Care | Payer: Medicare HMO | Source: Ambulatory Visit | Attending: Surgery | Admitting: Surgery

## 2011-07-20 DIAGNOSIS — R12 Heartburn: Secondary | ICD-10-CM | POA: Insufficient documentation

## 2011-07-20 DIAGNOSIS — K5710 Diverticulosis of small intestine without perforation or abscess without bleeding: Secondary | ICD-10-CM | POA: Insufficient documentation

## 2011-07-20 DIAGNOSIS — K3189 Other diseases of stomach and duodenum: Secondary | ICD-10-CM | POA: Insufficient documentation

## 2011-07-20 DIAGNOSIS — Z01818 Encounter for other preprocedural examination: Secondary | ICD-10-CM | POA: Insufficient documentation

## 2011-07-20 DIAGNOSIS — R1013 Epigastric pain: Secondary | ICD-10-CM | POA: Insufficient documentation

## 2011-07-21 ENCOUNTER — Other Ambulatory Visit (INDEPENDENT_AMBULATORY_CARE_PROVIDER_SITE_OTHER): Payer: Self-pay | Admitting: Surgery

## 2011-07-21 ENCOUNTER — Ambulatory Visit
Admission: RE | Admit: 2011-07-21 | Discharge: 2011-07-21 | Disposition: A | Discharge status: Home / Self Care | Payer: Medicaid Other | Source: Ambulatory Visit | Attending: Surgery | Admitting: Surgery

## 2011-07-21 DIAGNOSIS — Z1231 Encounter for screening mammogram for malignant neoplasm of breast: Secondary | ICD-10-CM

## 2011-07-21 DIAGNOSIS — D249 Benign neoplasm of unspecified breast: Secondary | ICD-10-CM

## 2011-07-22 LAB — COMPREHENSIVE METABOLIC PANEL
ALT: 9 U/L (ref 0–35)
AST: 14 U/L (ref 0–37)
Albumin: 3.7 g/dL (ref 3.5–5.2)
Alkaline Phosphatase: 59 U/L (ref 39–117)
BUN: 11 mg/dL (ref 6–23)
CO2: 25 mEq/L (ref 19–32)
Calcium: 9.5 mg/dL (ref 8.4–10.5)
Chloride: 100 mEq/L (ref 96–112)
Creat: 0.68 mg/dL (ref 0.50–1.10)
Glucose, Bld: 78 mg/dL (ref 70–99)
Potassium: 4.2 mEq/L (ref 3.5–5.3)
Sodium: 137 mEq/L (ref 135–145)
Total Bilirubin: 0.3 mg/dL (ref 0.3–1.2)
Total Protein: 7.3 g/dL (ref 6.0–8.3)

## 2011-07-22 LAB — PREGNANCY, URINE: Preg Test, Ur: NEGATIVE

## 2011-07-22 LAB — CBC
HCT: 36.5 % (ref 36.0–46.0)
Hemoglobin: 11 g/dL — ABNORMAL LOW (ref 12.0–15.0)
MCH: 24.5 pg — ABNORMAL LOW (ref 26.0–34.0)
MCHC: 30.1 g/dL (ref 30.0–36.0)
MCV: 81.3 fL (ref 78.0–100.0)
Platelets: 574 10*3/uL — ABNORMAL HIGH (ref 150–400)
RBC: 4.49 MIL/uL (ref 3.87–5.11)
RDW: 17.1 % — ABNORMAL HIGH (ref 11.5–15.5)
WBC: 14.1 10*3/uL — ABNORMAL HIGH (ref 4.0–10.5)

## 2011-07-22 LAB — TSH: TSH: 4.951 u[IU]/mL — ABNORMAL HIGH (ref 0.350–4.500)

## 2011-07-22 LAB — T4: T4, Total: 10.6 ug/dL (ref 5.0–12.5)

## 2011-07-28 ENCOUNTER — Encounter (HOSPITAL_COMMUNITY)
Admission: RE | Disposition: A | Discharge status: Home Health | Payer: Self-pay | Source: Ambulatory Visit | Attending: Surgery

## 2011-07-28 ENCOUNTER — Ambulatory Visit (HOSPITAL_COMMUNITY)
Admission: RE | Admit: 2011-07-28 | Discharge: 2011-07-28 | Disposition: A | Discharge status: Home Health | Payer: Medicare HMO | Source: Ambulatory Visit | Attending: Surgery | Admitting: Surgery

## 2011-07-28 DIAGNOSIS — Z01818 Encounter for other preprocedural examination: Secondary | ICD-10-CM | POA: Insufficient documentation

## 2011-07-28 HISTORY — PX: BREATH TEK H PYLORI: SHX5422

## 2011-07-28 SURGERY — BREATH TEST, FOR HELICOBACTER PYLORI

## 2011-07-29 ENCOUNTER — Encounter (HOSPITAL_COMMUNITY): Payer: Self-pay | Admitting: Surgery

## 2011-08-05 ENCOUNTER — Other Ambulatory Visit: Payer: Self-pay | Admitting: Family Medicine

## 2011-08-06 ENCOUNTER — Telehealth: Payer: Self-pay | Admitting: Psychology

## 2011-08-06 NOTE — Telephone Encounter (Signed)
Got message from [facility] up front saying patient is in need of bariatric counseling.  Agreed to call patient to discuss what she means and direct how to proceed.  Home / mobile phone - no answer and no opportunity to leave a message.  I called the work number listed and the person who answered said she doesn't work there.  Will alert Angelique Blonder in case patient calls back and will follow accordingly.

## 2011-08-07 ENCOUNTER — Other Ambulatory Visit: Payer: Self-pay | Admitting: Family Medicine

## 2011-08-07 DIAGNOSIS — M25569 Pain in unspecified knee: Secondary | ICD-10-CM

## 2011-08-07 MED ORDER — HYDROCODONE-ACETAMINOPHEN 7.5-500 MG PO TABS: 1.0000 | ORAL_TABLET | Freq: Four times a day (QID) | ORAL | Status: DC | PRN

## 2011-08-07 NOTE — Telephone Encounter (Signed)
Patient is calling back about the missing prescriptions for her Pamelor and Hydrocodone.

## 2011-08-08 ENCOUNTER — Ambulatory Visit: Payer: Medicaid Other | Admitting: *Deleted

## 2011-08-18 NOTE — Telephone Encounter (Signed)
Crystal Garza called and left a VM inquiring about an assessment for bariatric surgery.  I called her back and was able to leave a VM.

## 2011-08-24 ENCOUNTER — Encounter: Payer: Self-pay | Admitting: Family Medicine

## 2011-08-24 ENCOUNTER — Ambulatory Visit (INDEPENDENT_AMBULATORY_CARE_PROVIDER_SITE_OTHER): Payer: Medicaid Other | Admitting: Family Medicine

## 2011-08-24 VITALS — BP 138/88 | HR 110 | Temp 98.4°F | Ht 67.0 in | Wt 390.0 lb

## 2011-08-24 DIAGNOSIS — M25569 Pain in unspecified knee: Secondary | ICD-10-CM

## 2011-08-24 DIAGNOSIS — I10 Essential (primary) hypertension: Secondary | ICD-10-CM

## 2011-08-24 DIAGNOSIS — F411 Generalized anxiety disorder: Secondary | ICD-10-CM

## 2011-08-24 DIAGNOSIS — F419 Anxiety disorder, unspecified: Secondary | ICD-10-CM

## 2011-08-24 DIAGNOSIS — B372 Candidiasis of skin and nail: Secondary | ICD-10-CM

## 2011-08-24 MED ORDER — BACLOFEN 10 MG PO TABS: 10.0000 mg | ORAL_TABLET | Freq: Two times a day (BID) | ORAL | Status: AC | PRN

## 2011-08-24 MED ORDER — LISINOPRIL 20 MG PO TABS: ORAL_TABLET | ORAL | Status: DC

## 2011-08-24 MED ORDER — ALPRAZOLAM 1 MG PO TABS: 1.0000 mg | ORAL_TABLET | Freq: Two times a day (BID) | ORAL | Status: DC | PRN

## 2011-08-24 MED ORDER — MELOXICAM 15 MG PO TABS: 15.0000 mg | ORAL_TABLET | Freq: Every day | ORAL | Status: DC

## 2011-08-24 MED ORDER — NORTRIPTYLINE HCL 25 MG PO CAPS: 25.0000 mg | ORAL_CAPSULE | Freq: Every day | ORAL | Status: DC

## 2011-08-24 MED ORDER — OMEPRAZOLE 40 MG PO CPDR: 40.0000 mg | DELAYED_RELEASE_CAPSULE | Freq: Every day | ORAL | Status: DC

## 2011-08-24 MED ORDER — HYDROCHLOROTHIAZIDE 25 MG PO TABS: 25.0000 mg | ORAL_TABLET | Freq: Every day | ORAL | Status: DC

## 2011-08-24 MED ORDER — LORATADINE 10 MG PO TABS: 10.0000 mg | ORAL_TABLET | Freq: Every day | ORAL | Status: DC

## 2011-08-24 MED ORDER — PROMETHAZINE HCL 25 MG PO TABS: ORAL_TABLET | ORAL | Status: DC

## 2011-08-24 MED ORDER — HYDROCODONE-ACETAMINOPHEN 7.5-500 MG PO TABS: 1.0000 | ORAL_TABLET | Freq: Four times a day (QID) | ORAL | Status: DC | PRN

## 2011-08-24 MED ORDER — SERTRALINE HCL 100 MG PO TABS: 100.0000 mg | ORAL_TABLET | Freq: Every day | ORAL | Status: DC

## 2011-08-24 NOTE — Patient Instructions (Signed)
It was good to see you today. I am sorry you are stressed over your surgery. I hope everything works out, and I apologize we are not able to help more. If there is anything else I can help you with, please let me know!  Please find some time for YOU to relax. It is best for your health and your anxiety level.  Also, I have tried to adjust some medications to fit your insurance plan. If it does not work, please let the office know and I will try again.  Take care! Arby Dahir M. Katniss Weedman, M.D.

## 2011-08-24 NOTE — Progress Notes (Signed)
Subjective:     Patient ID: Crystal Garza, female   DOB: May 17, 1970, 41 y.o.   MRN: [id number]  HPI Patient is a 41 yo F presenting to the clinic today for follow up for chronic medical problems, as well as a concern for a rash under her breast  1. Obesity- Patient is currently trying to finish her requirements for Lap Band surgery. She had fulfilled all requirements except her psych evaluation. She recently changed insurance companies and all approved counselors are outside of her network. She has been in touch with Dr. Pascal Garza to see if she can help. The requirements for clearance are outside of Dr. Carola Garza scope of practice, and I confirmed that with her today. She met with patient face-to-face and went over the check list again. [name] is getting discouraged that she may have to pay out of pocket for this counseling. I understand her frustration, but gave her encouragement that it will be worth it once she can have the procedure. I do feel like this procedure will be life changing for her. She will be able to get around better, have less pain, less stress, less anxiety and will overall feel better. I encouraged her to keep trying.  2. HTN- BP "doing well" per patient. She is requesting medication refills. SBP slightly elevated in the office today after ambulating from lobby to exam room. Patient denies HA, changes in vision, chest pain. She does report some swelling in her legs bilaterally that resolves with elevation.  3. Anxiety- Patient states she has become more anxious recently because of the difficulty she is going through for her lap band procedure and afraid she might not have the money to pay for everything. She also states her grandchildren are making her more stressed. She does take time for herself and goes to [employer] to be in a quiet place without this kids. She is requesting an increase in her Ativan for the short term from 0.5 to 1mg . We discussed this is a coping mechanism and not  a cure for her anxiety, and she understands. She is currently taking Zoloft and it seems to help. She does seem more upset/anxious today. Denies chest pain, SOB, SI/HI.  4. Rash- Patient states rash has been there for weeks. It burns and has started peeling. Never had anything like this before. Has not tried any medication on it.   Review of Systems  Constitutional: Negative for fever.  Eyes: Negative for visual disturbance.  Respiratory: Negative for shortness of breath.   Cardiovascular: Positive for leg swelling. Negative for chest pain.  Gastrointestinal: Negative for abdominal pain.  Genitourinary: Negative for dysuria.  Musculoskeletal: Positive for myalgias and arthralgias.  Skin: Negative for rash.  Neurological: Negative for headaches.       Objective:   Physical Exam  Constitutional: She appears well-developed and well-nourished. No distress.  HENT:  Head: Normocephalic and atraumatic.  Cardiovascular: Normal rate, regular rhythm and normal heart sounds.   Pulmonary/Chest: Effort normal and breath sounds normal.  Abdominal: Soft. There is no tenderness.       Obese  Musculoskeletal: She exhibits no edema.  Skin:       Erythematous, peeling rash under left breast. Hard to see if satellite lesions are present. Tender to palpation.       Assessment:  41 yo F presenting for follow up appointment   Plan:

## 2011-08-25 ENCOUNTER — Telehealth: Payer: Self-pay | Admitting: Psychology

## 2011-08-25 DIAGNOSIS — B372 Candidiasis of skin and nail: Secondary | ICD-10-CM | POA: Insufficient documentation

## 2011-08-25 MED ORDER — NYSTATIN 100000 UNIT/GM EX CREA: TOPICAL_CREAM | Freq: Two times a day (BID) | CUTANEOUS | Status: DC

## 2011-08-25 NOTE — Assessment & Plan Note (Signed)
Patient has met all requirements for lap band except psych evaluation. We are not able to help her with this. She has been in touch with coordinator at the surgery office, as well as Dr. Pascal Lux. Patient will likely have to pay out of pocket for visit with approved clinician in order to go forward with procedure. Patient still hopeful for lap band, and so am I.

## 2011-08-25 NOTE — Telephone Encounter (Signed)
From Leandrew Koyanagi:   Thank you for considering it! We are currently trying to find a provider that is in-network with more of the insurance companies and is willing to do bariatric evaluations...there are very few providers to choose from, unfortunately. I normally only verify the surgeon benefits with the insurance (the psychologist office verifies their benefits and obtains authorization if needed). I don't have the billing codes, etc. that the psychologist uses for the evaluations. I will get with the patient and see what we can do to get this last piece done as inexpensively as possible.  Thanks again! Leandrew Koyanagi [facility] [phone] [address], [address] [place], [place] [address] [phone] ph [phone] fax   From: Spero Geralds [email or web address]] Sent: Tuesday, August 25, 2011 12:22 PM To: Leandrew Koyanagi Subject: RE: Aranda Bihm. I looked at the requirements and spoke with Liborio Nixon. Based on what I read, this is out of my competency. I do not know enough about bariatric surgery to know what following the pre-operative and post-operative regimen. I also do not use some of the measures listed at all. If a mental status examination and an assessment of anxiety and mood issues were sufficient, I would be able to do that.   Avarey said she did not know how much it would cost to pay out of pocket for the out-of-network psychologist to do the assessment. If you have those numbers, could you please let her know if you haven't already. I am hoping she can get something done or perhaps set up a payment plan? I am sorry I was not able to help.   Marcelino Duster

## 2011-08-25 NOTE — Assessment & Plan Note (Signed)
Increasing anxiety in last few months over social situation as well as concern over bariatric procedure. Discussed that she needs to take time for herself to remove herself from stressful situations, which she is already doing. Will increase Xanax to 1mg  BID prn for now. Continue taking Zoloft. Will address change in SSRI, if needed, to better control anxiety.

## 2011-08-25 NOTE — Assessment & Plan Note (Signed)
Probable yeast infection noted under left breast. Painful/burning for patient. Will prescribe Nystatin cream to use on area.

## 2011-08-25 NOTE — Assessment & Plan Note (Signed)
Continue HCTZ and Lisinopril. If BP still elevated at next visit, can consider increasing Lisinopril or adding another agent. Again, bariatric procedure will likely help control BP.

## 2011-08-27 ENCOUNTER — Encounter: Payer: Self-pay | Admitting: Family Medicine

## 2011-08-27 ENCOUNTER — Telehealth: Payer: Self-pay | Admitting: *Deleted

## 2011-08-27 ENCOUNTER — Ambulatory Visit (INDEPENDENT_AMBULATORY_CARE_PROVIDER_SITE_OTHER): Payer: Medicare HMO | Admitting: Family Medicine

## 2011-08-27 VITALS — BP 135/83 | HR 92 | Temp 98.0°F | Ht 67.0 in | Wt 395.7 lb

## 2011-08-27 DIAGNOSIS — R609 Edema, unspecified: Secondary | ICD-10-CM | POA: Insufficient documentation

## 2011-08-27 DIAGNOSIS — I10 Essential (primary) hypertension: Secondary | ICD-10-CM

## 2011-08-27 MED ORDER — POTASSIUM CHLORIDE CRYS ER 20 MEQ PO TBCR: 20.0000 meq | EXTENDED_RELEASE_TABLET | Freq: Every day | ORAL | Status: DC

## 2011-08-27 MED ORDER — FUROSEMIDE 40 MG PO TABS: 40.0000 mg | ORAL_TABLET | Freq: Every day | ORAL | Status: DC

## 2011-08-27 NOTE — Progress Notes (Signed)
  Subjective:    Patient ID: Crystal Garza, female    DOB: 1970-09-12, 41 y.o.   MRN: [id number]  HPI  Patient presents as a work in for bilateral leg swelling.  Patient says that her legs have been swollen for about one week, but swelling became progressively worse today. She was last seen by her PCP 3 days ago, but at that time her leg swelling improved with elevation. Patient denies any leg pain. Denies any numbness or tingling sensation of her extremities.  Currently patient takes hydrochlorothiazide 25 mg daily. Her blood pressure is well-controlled today.   Patient does not have a documented echo or history of congestive heart failure. She denies any alcohol abuse.    Review of Systems Denies any productive cough, shortness of breath, difficulty breathing, chest pain.  Endorses peripheral bilateral leg edema. Denies any calf pain tenderness.    Objective:   Physical Exam  Constitutional: No distress.       Morbidly obese  Neck: No JVD present.  Cardiovascular: Normal rate, regular rhythm and normal heart sounds.   No murmur heard. Pulmonary/Chest: Effort normal and breath sounds normal. She has no wheezes. She has no rales.  Abdominal: Soft. She exhibits no distension.  Musculoskeletal:       2-3 + pitting edema bilateral ankles, 1 + pitting pre-tibial edema; strong pedal pulses; no skin breakdown; no erythema or calf tenderness  Skin: Skin is warm and dry. No rash noted. No erythema.          Assessment & Plan:

## 2011-08-27 NOTE — Telephone Encounter (Signed)
Patient calls reporting  feet are swelling more since her last visit here on Tuesday. Larey Seat very tight. Appointment scheduled to come in today .

## 2011-08-27 NOTE — Patient Instructions (Signed)
It was nice to meet you today. You are retaining some fluid in your ankles. Please start taking Lasix 40 mg one tablet daily x 7 days. Also take Potassium or K-Dur 1 tablet daily x 7 days. During the day, wear compression hose and take them off at night. Remember to elevate legs over your heart at bedtime. Schedule follow up appointment with Dr. Mikel Cella in ONE week. Please come back sooner if you develop worsening swelling, shortness of breath, CP, productive cough.  Peripheral Edema You have swelling in your legs (peripheral edema). This swelling is due to excess accumulation of salt and water in your body. Edema may be a sign of heart, kidney or liver disease, or a side effect of a medication. It may also be due to problems in the leg veins. Elevating your legs and using special support stockings may be very helpful, if the cause of the swelling is due to poor venous circulation. Avoid long periods of standing, whatever the cause. Treatment of edema depends on identifying the cause. Chips, pretzels, pickles and other salty foods should be avoided. Restricting salt in your diet is almost always needed. Water pills (diuretics) are often used to remove the excess salt and water from your body via urine. These medicines prevent the kidney from reabsorbing sodium. This increases urine flow. Diuretic treatment may also result in lowering of potassium levels in your body. Potassium supplements may be needed if you have to use diuretics daily. Daily weights can help you keep track of your progress in clearing your edema. You should call your caregiver for follow up care as recommended. SEEK IMMEDIATE MEDICAL CARE IF:   You have increased swelling, pain, redness, or heat in your legs.   You develop shortness of breath, especially when lying down.   You develop chest or abdominal pain, weakness, or fainting.   You have a fever.  Document Released: 05/28/2004 Document Revised: 04/09/2011 Document  Reviewed: 05/08/2009 [facility] Patient Information 2012 [place], [place].

## 2011-08-27 NOTE — Assessment & Plan Note (Signed)
Blood pressure well controlled today. Continue current regimen. Refer to peripheral edema on problem list.

## 2011-08-27 NOTE — Assessment & Plan Note (Signed)
Peripheral edema likely secondary to morbid obesity versus fluid retention secondary to high sodium intake. Patient's kidney function and liver function were within normal limits. Her albumin was also normal. Will defer to PCP with her not to order an echocardiogram to evaluate ventricular function. For now, we'll treat with TED hose, Lasix and potassium supplements x7 days. Advised patient to elevate legs over heart at that time. Patient to followup with PCP in one week to either titrate Lasix or discontinue it.

## 2011-09-04 ENCOUNTER — Ambulatory Visit: Payer: Medicare HMO | Admitting: Family Medicine

## 2011-09-05 ENCOUNTER — Encounter: Payer: Medicare HMO | Attending: Surgery | Admitting: *Deleted

## 2011-09-05 ENCOUNTER — Encounter: Payer: Self-pay | Admitting: *Deleted

## 2011-09-05 DIAGNOSIS — Z713 Dietary counseling and surveillance: Secondary | ICD-10-CM | POA: Insufficient documentation

## 2011-09-05 DIAGNOSIS — Z01818 Encounter for other preprocedural examination: Secondary | ICD-10-CM | POA: Insufficient documentation

## 2011-09-05 NOTE — Patient Instructions (Signed)
   Follow Pre-Op Nutrition Goals to prepare for Lap Band Surgery.   Call the [facility] at [phone] once you have been given your surgery date to enrolled in the Pre-Op Nutrition Class. You will need to attend this nutrition class 3-4 weeks prior to your surgery.  

## 2011-09-05 NOTE — Progress Notes (Addendum)
  Pre-Op Assessment Visit:  Pre-Operative LAGB Surgery  Medical Nutrition Therapy:  Appt start time: 0900 end time:  1000.  Patient was seen on 09/05/2011 for Pre-Operative LAGB Nutrition Assessment. Assessment and letter of approval faxed to [facility] Bariatric Surgery Program coordinator on 09/07/11.  Approval letter and Candace Gallus results sent to [facility] center and will be available in the chart under the media tab.  TANITA  BODY COMP RESULTS  09/05/11    %Fat 60.9%    FM (lbs) 236.5    FFM (lbs) 152.0    TBW (lbs) 111.0   Handouts given during visit include:  Pre-Op Goals   B.E.L.T. Program Flyer  Patient to call for Pre-Op and Post-Op Nutrition Education at the [facility] when surgery is scheduled.

## 2011-09-11 ENCOUNTER — Ambulatory Visit (INDEPENDENT_AMBULATORY_CARE_PROVIDER_SITE_OTHER): Payer: Medicare HMO | Admitting: *Deleted

## 2011-09-11 DIAGNOSIS — Z111 Encounter for screening for respiratory tuberculosis: Secondary | ICD-10-CM

## 2011-09-14 ENCOUNTER — Ambulatory Visit (INDEPENDENT_AMBULATORY_CARE_PROVIDER_SITE_OTHER): Payer: Medicare HMO | Admitting: *Deleted

## 2011-09-14 DIAGNOSIS — Z111 Encounter for screening for respiratory tuberculosis: Secondary | ICD-10-CM

## 2011-09-14 DIAGNOSIS — IMO0001 Reserved for inherently not codable concepts without codable children: Secondary | ICD-10-CM

## 2011-09-14 LAB — TB SKIN TEST
Induration: 0
TB Skin Test: NEGATIVE mm

## 2011-10-07 ENCOUNTER — Telehealth: Payer: Self-pay | Admitting: *Deleted

## 2011-10-07 MED ORDER — NORTRIPTYLINE HCL 25 MG PO CAPS: 25.0000 mg | ORAL_CAPSULE | Freq: Every day | ORAL | Status: DC

## 2011-10-07 NOTE — Telephone Encounter (Signed)
This was filled as recently as 08/24/11 based on medication history. I am not sure why it is not on her medication list. You may refill #30/2R.  Thank you. Iridiana Fonner M. Nadira Single, M.D.

## 2011-10-07 NOTE — Telephone Encounter (Signed)
Received refill request from [facility]  for Nortriptyline 25 mg , take one at bedtime. Last refill 08/30/2011. Do not see on med list. Will forward to MD.

## 2011-10-14 ENCOUNTER — Ambulatory Visit (INDEPENDENT_AMBULATORY_CARE_PROVIDER_SITE_OTHER): Payer: Medicare Other | Admitting: Family Medicine

## 2011-10-14 ENCOUNTER — Encounter: Payer: Self-pay | Admitting: Family Medicine

## 2011-10-14 VITALS — BP 144/76 | HR 113 | Temp 97.6°F | Ht 67.0 in | Wt 386.0 lb

## 2011-10-14 DIAGNOSIS — J309 Allergic rhinitis, unspecified: Secondary | ICD-10-CM

## 2011-10-14 MED ORDER — FLUTICASONE PROPIONATE 50 MCG/ACT NA SUSP: 2.0000 | Freq: Every day | NASAL | Status: DC

## 2011-10-14 NOTE — Assessment & Plan Note (Addendum)
Patient with nasal congestion and other symptoms consistent with allergies versus URI. Advised to continue Claritin and continue Flonase since she is not taking that very long. Also added nasal saline. Patient to return if no better.

## 2011-10-14 NOTE — Progress Notes (Signed)
  Subjective:    Patient ID: Crystal Garza, female    DOB: [birth year], 41 y.o.   MRN: [id number]  HPI Patient with symptoms of congestion, ear popping, yellow green discharge from nose the last 5 days. She restarted using Flonase 5 days ago. She denies fevers, headaches. She is a nonsmoker. She has not had any sick contacts. She has seasonal allergies and often gets a flare this time of year.   Review of Systems No nausea, vomiting, diarrhea. No fevers. No abdominal pain.    Objective:   Physical Exam Vital signs reviewed General appearance - alert, well appearing, and in no distress Heart - normal rate, regular rhythm, normal S1, S2, no murmurs, rubs, clicks or gallops Chest - clear to auscultation, no wheezes, rales or rhonchi, symmetric air entry, no tachypnea, retractions or cyanosis Eyes - no conjunctival injection Ears - bilateral TM's retracted but no fluid Nose - clear discharge present        Assessment & Plan:

## 2011-10-14 NOTE — Patient Instructions (Addendum)
I think that you are doing all of the right  Things Please continue the Flonase, this may take another week or so to work Please get some nasal saline from the pharmacy You can use this several times throughout the day to help with congestion and to prevent dryness

## 2011-11-04 ENCOUNTER — Ambulatory Visit (INDEPENDENT_AMBULATORY_CARE_PROVIDER_SITE_OTHER): Payer: Medicare Other | Admitting: Family Medicine

## 2011-11-04 ENCOUNTER — Encounter: Payer: Self-pay | Admitting: Family Medicine

## 2011-11-04 VITALS — BP 147/81 | HR 112 | Ht 67.0 in | Wt 391.0 lb

## 2011-11-04 DIAGNOSIS — F419 Anxiety disorder, unspecified: Secondary | ICD-10-CM

## 2011-11-04 DIAGNOSIS — Z79899 Other long term (current) drug therapy: Secondary | ICD-10-CM

## 2011-11-04 DIAGNOSIS — D649 Anemia, unspecified: Secondary | ICD-10-CM

## 2011-11-04 DIAGNOSIS — Z0000 Encounter for general adult medical examination without abnormal findings: Secondary | ICD-10-CM | POA: Insufficient documentation

## 2011-11-04 DIAGNOSIS — F411 Generalized anxiety disorder: Secondary | ICD-10-CM

## 2011-11-04 DIAGNOSIS — E669 Obesity, unspecified: Secondary | ICD-10-CM

## 2011-11-04 DIAGNOSIS — D509 Iron deficiency anemia, unspecified: Secondary | ICD-10-CM

## 2011-11-04 DIAGNOSIS — B372 Candidiasis of skin and nail: Secondary | ICD-10-CM

## 2011-11-04 DIAGNOSIS — J309 Allergic rhinitis, unspecified: Secondary | ICD-10-CM

## 2011-11-04 LAB — CBC
HCT: 35.4 % — ABNORMAL LOW (ref 36.0–46.0)
Hemoglobin: 11.1 g/dL — ABNORMAL LOW (ref 12.0–15.0)
MCH: 24.2 pg — ABNORMAL LOW (ref 26.0–34.0)
MCHC: 31.4 g/dL (ref 30.0–36.0)
MCV: 77.3 fL — ABNORMAL LOW (ref 78.0–100.0)
Platelets: 565 10*3/uL — ABNORMAL HIGH (ref 150–400)
RBC: 4.58 MIL/uL (ref 3.87–5.11)
RDW: 17.3 % — ABNORMAL HIGH (ref 11.5–15.5)
WBC: 11.4 10*3/uL — ABNORMAL HIGH (ref 4.0–10.5)

## 2011-11-04 LAB — LDL CHOLESTEROL, DIRECT: Direct LDL: 95 mg/dL

## 2011-11-04 LAB — POCT GLYCOSYLATED HEMOGLOBIN (HGB A1C): Hemoglobin A1C: 6.1

## 2011-11-04 MED ORDER — TRIAMCINOLONE ACETONIDE 0.1 % EX OINT: TOPICAL_OINTMENT | Freq: Two times a day (BID) | CUTANEOUS | Status: DC

## 2011-11-04 MED ORDER — HYDROCHLOROTHIAZIDE 25 MG PO TABS: 25.0000 mg | ORAL_TABLET | Freq: Every day | ORAL | Status: DC

## 2011-11-04 MED ORDER — BUTALBITAL-APAP-CAFFEINE 50-325-40 MG PO TABS: 1.0000 | ORAL_TABLET | Freq: Four times a day (QID) | ORAL | Status: DC | PRN

## 2011-11-04 MED ORDER — NYSTATIN 100000 UNIT/GM EX CREA: TOPICAL_CREAM | Freq: Two times a day (BID) | CUTANEOUS | Status: DC

## 2011-11-04 MED ORDER — OMEPRAZOLE 40 MG PO CPDR: 40.0000 mg | DELAYED_RELEASE_CAPSULE | Freq: Every day | ORAL | Status: DC

## 2011-11-04 MED ORDER — ALPRAZOLAM 1 MG PO TABS: 1.0000 mg | ORAL_TABLET | Freq: Two times a day (BID) | ORAL | Status: DC | PRN

## 2011-11-04 NOTE — Patient Instructions (Signed)
It was great to see you today!  Please stop by the lab on your way out today, also please make an appointment to see [name] for your yearly wellness visit. I will send you a letter with your lab results.  You can get Zyrtec (generic name Cetirizine) for your congestion.  Good luck with the final process for your surgery! I am very excited for you! Please come back to see me in 2-3 months, or earlier if needed.  Melaina Howerton M. Danine Hor, M.D.

## 2011-11-04 NOTE — Assessment & Plan Note (Signed)
Encouraged patient to avoid OTC cold medications given her BP. She can use the medication safe for HTN, but otherwise she should not take decongestants. She can also take Zyrtec daily as well.

## 2011-11-04 NOTE — Progress Notes (Signed)
Subjective:     Patient ID: Crystal Garza, female   DOB: 20-Oct-1970, 41 y.o.   MRN: [id number]  HPI Patient is a 41 yo F presenting for annual exam including lab work.  1. Annual exam- Patient receives pap smear at GYN office and is up to date. She has never had a HbA1C checked in the past. She also needs a cholesterol check, but she has eaten today therefore we will plan on direct LDL today. Patient does need her yearly wellness exam with [name] as well. Overall, patient states she feels well. No headaches, sore throat, chest pain, SOB, abd pain. She does have intermittent leg edema that resolves with elevation at night.   2. Nasal congestion- Pt taking Claritin and Flonase and congestion returns. No fevers, productive cough or sore throat. Only nasal congestion with post-nasal drip. She has tried OTC medications such as Nyquil with little relief.   3. Obesity- Patient in process of lap band surgery. She finally has an appointment with psych today! (Required insurance changes, but it is finally happening) which is her final step to the procedures. We will follow along with her as she prepares for this big life change.   History reviewed: Non-smoker  Review of Systems See HPI above    Objective:   Physical Exam  Constitutional: She appears well-developed and well-nourished. No distress.  HENT:  Head: Normocephalic and atraumatic.  Neck: Normal range of motion.  Cardiovascular: Normal rate, regular rhythm and normal heart sounds.   No murmur heard. Pulmonary/Chest: Effort normal and breath sounds normal. She has no wheezes.  Abdominal: Soft. There is no tenderness.       Obese  Musculoskeletal: She exhibits no edema.  Lymphadenopathy:    She has no cervical adenopathy.  Skin: Skin is warm and dry.       Assessment:    41 yo F annual exam   Plan:

## 2011-11-04 NOTE — Assessment & Plan Note (Signed)
Last part of pre-op requirements will be completed today. Patient will keep [name] updated on when her surgery is scheduled. She is very excited about this opportunity, and so am I.

## 2011-11-04 NOTE — Assessment & Plan Note (Addendum)
Patient overall doing well. Will check hemoglobin A1c today due to obesity. Will also check direct LDL since she ate today. Will check CBC given her history of iron deficiency anemia. She will be seen by GYN for pap smear.  Will also refer to [name] for annual wellness visit. Medications refilled today.

## 2011-11-06 ENCOUNTER — Encounter: Payer: Self-pay | Admitting: Family Medicine

## 2011-11-09 ENCOUNTER — Ambulatory Visit: Payer: Medicare Other | Admitting: Home Health Services

## 2011-11-25 ENCOUNTER — Other Ambulatory Visit: Payer: Self-pay | Admitting: Family Medicine

## 2011-11-25 DIAGNOSIS — M25569 Pain in unspecified knee: Secondary | ICD-10-CM

## 2011-11-25 MED ORDER — HYDROCODONE-ACETAMINOPHEN 7.5-500 MG PO TABS: 1.0000 | ORAL_TABLET | Freq: Four times a day (QID) | ORAL | Status: DC | PRN

## 2011-11-25 NOTE — Progress Notes (Signed)
Called in verbally. Tannah Dreyfuss Dawn  

## 2011-12-02 ENCOUNTER — Ambulatory Visit: Payer: Medicare HMO | Admitting: Family Medicine

## 2011-12-14 ENCOUNTER — Other Ambulatory Visit: Payer: Self-pay | Admitting: Family Medicine

## 2011-12-14 NOTE — Telephone Encounter (Signed)
2

## 2012-01-11 ENCOUNTER — Ambulatory Visit: Payer: Medicare Other | Admitting: Family Medicine

## 2012-01-19 ENCOUNTER — Encounter: Payer: Self-pay | Admitting: Family Medicine

## 2012-01-19 ENCOUNTER — Ambulatory Visit (INDEPENDENT_AMBULATORY_CARE_PROVIDER_SITE_OTHER): Payer: Medicaid Other | Admitting: Family Medicine

## 2012-01-19 VITALS — BP 120/78 | Temp 98.4°F | Ht 67.0 in | Wt 399.0 lb

## 2012-01-19 DIAGNOSIS — F411 Generalized anxiety disorder: Secondary | ICD-10-CM

## 2012-01-19 DIAGNOSIS — M25569 Pain in unspecified knee: Secondary | ICD-10-CM

## 2012-01-19 DIAGNOSIS — F319 Bipolar disorder, unspecified: Secondary | ICD-10-CM | POA: Insufficient documentation

## 2012-01-19 DIAGNOSIS — M1710 Unilateral primary osteoarthritis, unspecified knee: Secondary | ICD-10-CM

## 2012-01-19 DIAGNOSIS — IMO0002 Reserved for concepts with insufficient information to code with codable children: Secondary | ICD-10-CM

## 2012-01-19 DIAGNOSIS — F419 Anxiety disorder, unspecified: Secondary | ICD-10-CM

## 2012-01-19 MED ORDER — HYDROCODONE-ACETAMINOPHEN 7.5-500 MG PO TABS: 1.0000 | ORAL_TABLET | Freq: Four times a day (QID) | ORAL | Status: DC | PRN

## 2012-01-19 MED ORDER — HYDROCHLOROTHIAZIDE 25 MG PO TABS: 25.0000 mg | ORAL_TABLET | Freq: Every day | ORAL | Status: DC

## 2012-01-19 MED ORDER — LISINOPRIL 20 MG PO TABS: ORAL_TABLET | ORAL | Status: DC

## 2012-01-19 MED ORDER — ALPRAZOLAM 1 MG PO TABS: 1.0000 mg | ORAL_TABLET | Freq: Two times a day (BID) | ORAL | Status: DC | PRN

## 2012-01-19 MED ORDER — PROMETHAZINE HCL 25 MG PO TABS: ORAL_TABLET | ORAL | Status: DC

## 2012-01-19 NOTE — Assessment & Plan Note (Signed)
Managed by Dr. Tomasa Rand but it seems that her medications are working for her and she is pleased with this. I appreciate Dr. Milas Hock help. Zoloft and Nortriptyline d/c today and removed from medication list.

## 2012-01-19 NOTE — Progress Notes (Signed)
Subjective:     Patient ID: Crystal Garza, female   DOB: [birth year], 41 y.o.   MRN: [id number]  HPI Pt presents for follow up appointment today  1. Weight- Still awaiting lap band. She has completed all the pre-reqs but now awaiting a surgery approval. According to our EMR, she has a surgery scheduled on Oct 15 but she is unaware of this. Patient is unsure if [facility] has received all of the paperwork from our clinic. Dr. Daphine Deutscher will be doing her procedure. She has gained some weight, but overall she has been stable. She is continuing to watch her intake and exercise is limited due to pain (see #3). No fevers, chills.  2. Bipolar- Seen in June by psych (Dr. Tiajuana Amass), started on Bipolar medication. Followed by mental health.  Started on Depakote 500mg  2 tabs every other morning, and 2 tabs at bedtime. Lamictal 100mg  at bedtime. Interested in stopping Nortriptyline which she was on for headaches. Zoloft was also stopped. Reports her mood sometimes is worse, but overall improvement with the medications. Felt overwhelmed at first, but now she thinks this is a good change for her. She denies SI/HI. Currently no racing thoughts.  3. Knee/ankle pain- Followed by ortho. Currently in walking boot because a fall secondary to pain in the back of her knee that radiates down her leg to the ankle. Per patient, she was placed in boot to see if it could prolong the time until she requires a knee replacement. She does get her pain medication from me, and is requesting a refill today. No increased joint swelling, no rash, no redness.  History reviewed: Nonsmoker  Review of Systems See HPI    Objective:   Physical Exam General: Awake, alert, pleasant. HEENT: AT, [place] Abd: Obese, nontender Ext: Walking boot in place LLE. Moves all extremities Neuro: Grossly intact Psych: Very pleasant. No pressured speech. Good eye contact. Laughing and happy.    Assessment:     41 yo F follow up appointment    Plan:     See problem list

## 2012-01-19 NOTE — Assessment & Plan Note (Signed)
Followed by Ortho. In walking boot now, but will likely need knee replacement. We have a pain contract and I have refilled her pain medication today. Follow up in 3 months, or sooner if needed.

## 2012-01-19 NOTE — Patient Instructions (Signed)
It was good to see you today. Thanks for coming in.  Your surgery is scheduled for Oct 15. Please give Florentina Addison a call to confirm all of the arrangements.  I will see you back in November for a follow up. Please let me know if you need anything before then.  Nieve Rojero M. Lakeena Downie, M.D.

## 2012-01-19 NOTE — Assessment & Plan Note (Signed)
Anxiety is improving. She still requires some Xanax when most stressed. Will refill today. Patient to follow up in 3 months.

## 2012-01-19 NOTE — Assessment & Plan Note (Signed)
Awaiting Lap Band. She has had a prolonged course of requirements, that have all been met. Patient has a surgery scheduled on Oct 15 at [facility]. Patient was unaware of that, but it was confirmed with [facility]. Encouraged Crystal Garza to give [facility] a call to see what she should do to prepare for surgery. I hope she can have this surgery, I know she has waited a long time for it and I feel like it will be very beneficial.

## 2012-01-26 ENCOUNTER — Encounter (HOSPITAL_COMMUNITY): Payer: Self-pay | Admitting: Pharmacy Technician

## 2012-01-28 ENCOUNTER — Encounter: Payer: Self-pay | Admitting: *Deleted

## 2012-01-28 ENCOUNTER — Encounter: Payer: Medicaid Other | Attending: Surgery | Admitting: *Deleted

## 2012-01-28 DIAGNOSIS — Z713 Dietary counseling and surveillance: Secondary | ICD-10-CM | POA: Insufficient documentation

## 2012-01-28 DIAGNOSIS — Z01818 Encounter for other preprocedural examination: Secondary | ICD-10-CM | POA: Insufficient documentation

## 2012-01-28 NOTE — Progress Notes (Signed)
Bariatric Class:  Appt start time: 0830 end time:  0930.  Pre-Operative Nutrition Class  Patient was seen on 01/28/2012 for Pre-Operative Bariatric Surgery Education at [facility].   Surgery date: 02/16/12 Surgery type: LAGB Start weight at [facility]: 388.5 lbs Goal weight:   Weight today: 401.4 lbs (with boot) Weight change: n/a Total weight lost: n/a BMI: 62.8 kg/m^2  Samples given per MNT protocol: Bariatric Advantage Multivitamin Lot # [id number]; Exp: 12/13  Celebrate Vitamins Multivitamin Lot # [id number]; Exp: 07/14  Celebrate Vitamins Iron + C 60 mg Lot # [id number]; Exp: 03/14  Celebrate Vitamins Sublingual B12 Lot # [id number]; Exp: 05/15  Opurity Vitamins Multivitamin Lot # [id number]; Exp: 02/14  Premier Nutrition Protein Shake Lot # [id number]; Exp: 10/28/12  The following the learning objective met by the patient during this course:  Identifies Pre-Op Dietary Goals and will begin 2 weeks pre-operatively  Identifies appropriate sources of fluids and proteins   States protein recommendations and appropriate sources pre and post-operatively  Identifies Post-Operative Dietary Goals and will follow for 2 weeks post-operatively  Identifies appropriate multivitamin and calcium sources  Describes the need for physical activity post-operatively and will follow MD recommendations  States when to call healthcare provider regarding medication questions or post-operative complications  Handouts given during class include:  Pre-Op Bariatric Surgery Diet Handout  Protein Shake Handout  Post-Op Bariatric Surgery Nutrition Handout  BELT Program Information Flyer  Support Group Information Flyer  Follow-Up Plan: Patient will follow-up at [facility] 2 weeks post operatively for diet advancement per MD.

## 2012-01-28 NOTE — Patient Instructions (Signed)
Follow:   Pre-Op Diet per MD 2 weeks prior to surgery  Phase 2- Liquids (clear/full) 2 weeks after surgery  Vitamin/Mineral/Calcium guidelines for purchasing bariatric supplements  Exercise guidelines pre and post-op per MD  Follow-up at [facility] in 2 weeks post-op for diet advancement. Contact Daniell Mancinas as needed with questions/concerns. 

## 2012-02-04 NOTE — Progress Notes (Signed)
Dr. Daphine Deutscher : we need orders put in on Undra Allred please, Surg is 02/16/12 - coming for pre op 02/09/12 Thank you

## 2012-02-05 ENCOUNTER — Ambulatory Visit (INDEPENDENT_AMBULATORY_CARE_PROVIDER_SITE_OTHER): Payer: Medicaid Other | Admitting: Surgery

## 2012-02-05 ENCOUNTER — Encounter (INDEPENDENT_AMBULATORY_CARE_PROVIDER_SITE_OTHER): Payer: Self-pay | Admitting: Surgery

## 2012-02-05 DIAGNOSIS — Z6841 Body Mass Index (BMI) 40.0 and over, adult: Secondary | ICD-10-CM

## 2012-02-05 NOTE — Progress Notes (Signed)
Chief Complaint:  obesity  History of Present Illness:  Crystal Garza is an 41 y.o. 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  She know about the lapband and what will be required.  She is scheduled for Tuesday, October 15.  She is ready and has no further questions  Past Medical History  Diagnosis Date  . Anemia     iron def  . Hypertension   . Morbid obesity   . GERD (gastroesophageal reflux disease)   . Anxiety   . Depression   . Migraine   . Back pain   . Chronic knee pain     Past Surgical History  Procedure Date  . Abdominal hysterectomy   . Knee arthroscopy   . Cesarean section 05/1991  . Breath tek h pylori 07/28/2011    Procedure: BREATH TEK H PYLORI;  Surgeon: Valarie Merino, MD;  Location: [facility];  Service: General;  Laterality: N/A;  to be done at 745    Current Outpatient Prescriptions  Medication Sig Dispense Refill  . ALPRAZolam (XANAX) 1 MG tablet Take 1 mg by mouth 2 (two) times daily as needed.      . divalproex (DEPAKOTE) 500 MG DR tablet Take 1,000 mg by mouth 2 (two) times daily. 2 tabs every other morning and 2 tabs at bedtime daily per Dr. Tomasa Rand      . fluticasone [facility]) 50 MCG/ACT nasal spray Place 2 sprays into the nose daily.      . hydrochlorothiazide (HYDRODIURIL) 25 MG tablet Take 25 mg by mouth every morning.      Marland Kitchen HYDROcodone-acetaminophen (LORTAB) 7.5-500 MG per tablet Take 1 tablet by mouth every 6 (six) hours as needed. Pain      . lamoTRIgine (LAMICTAL) 100 MG tablet Take 100 mg by mouth at bedtime.      Marland Kitchen lisinopril (PRINIVIL,ZESTRIL) 20 MG tablet Take 20 mg by mouth at bedtime.      Marland Kitchen loratadine (CLARITIN) 10 MG tablet Take 10 mg by mouth daily.      Marland Kitchen nystatin cream (MYCOSTATIN) Apply 1 application topically 2 (two) times daily.      Marland Kitchen omeprazole (PRILOSEC) 40 MG capsule Take 40 mg by mouth daily.      . promethazine (PHENERGAN) 25 MG tablet Take 25 mg by mouth every 6 (six) hours  as needed. for nausea/vomiting      . triamcinolone ointment (KENALOG) 0.1 % Apply 1 application topically 2 (two) times daily.       Codeine Family History  Problem Relation Age of Onset  . Diabetes Mother   . Cancer Father     brain tumor, colon  . Diabetes Father   . Heart disease Father   . Heart disease Paternal Uncle   . Diabetes Paternal Uncle   . Diabetes Maternal Grandmother   . Heart disease Paternal Grandmother   . Heart disease Paternal Grandfather   . Diabetes Maternal Aunt   . Diabetes Maternal Uncle   . Diabetes Paternal Aunt   . Diabetes Maternal Grandfather    Social History:   reports that she has never smoked. She has never used smokeless tobacco. She reports that she drinks alcohol. She reports that she does not use illicit drugs.   REVIEW OF SYSTEMS - PERTINENT POSITIVES ONLY: See above  Physical Exam:   Blood pressure 128/80, pulse 118, temperature 97.8 F (36.6 C), temperature source Temporal, resp. rate 16, height 5'  7" (1.702 m), weight 397 lb 9.6 oz (180.35 kg). Body mass index is 62.27 kg/(m^2).  Gen:  WDWN AAF NAD  Neurological: Alert and oriented to person, place, and time. Motor and sensory function is grossly intact  Head: Normocephalic and atraumatic.  Eyes: Conjunctivae are normal. Pupils are equal, round, and reactive to light. No scleral icterus.  Neck: Normal range of motion. Neck supple. No tracheal deviation or thyromegaly present.  Cardiovascular:  SR without murmurs or gallops.  No carotid bruits Respiratory: Effort normal.  No respiratory distress. No chest wall tenderness. Breath sounds normal.  No wheezes, rales or rhonchi.  Abdomen:  Obese abdomen.  Nontender GU: Musculoskeletal: Normal range of motion. Extremities are nontender. No cyanosis, edema or clubbing noted Lymphadenopathy: No cervical, preauricular, postauricular or axillary adenopathy is present Skin: Skin is warm and dry. No rash noted. No diaphoresis. No erythema. No  pallor. Pscyh: Normal mood and affect. Behavior is normal. Judgment and thought content normal.   LABORATORY RESULTS: No results found for this or any previous visit (from the past 48 hour(s)).  RADIOLOGY RESULTS: No results found.  Problem List: Patient Active Problem List  Diagnosis  . Morbid obesity  . DEPRESSION, MAJOR, RECURRENT  . MIGRAINE, CHRONIC  . HYPERTENSION, BENIGN SYSTEMIC  . RHINITIS, ALLERGIC  . GASTROESOPHAGEAL REFLUX, NO ESOPHAGITIS  . OSTEOARTHRITIS, KNEES, BILATERAL  . Iron deficiency anemia  . Anxiety  . Back pain  . Eczematous dermatitis  . Arm paresthesia, left  . Nocturia  . Nephrolithiasis  . Need for prophylactic vaccination and inoculation against influenza  . Skin yeast infection  . Peripheral edema  . Encounter for annual health examination  . Bipolar disorder    Assessment & Plan: Morbid obesity Lapband     [name], MD, [facility] [phone] beeper [phone]  02/05/2012 6:06 PM

## 2012-02-05 NOTE — Patient Instructions (Signed)

## 2012-02-08 NOTE — Patient Instructions (Signed)
[name]  02/08/2012   Your procedure is scheduled on:  02/16/12 0925am-1130am  Report to [facility] at 0700 AM.  Call this number if you have problems the morning of surgery: [phone]   Remember:   Do not eat food:After Midnight.  May have clear liquids:until Midnight .    Take these medicines the morning of surgery with A SIP OF WATER:   Do not wear jewelry, make-up or nail polish.  Do not wear lotions, powders, or perfumes. .  Do not shave 48 hours prior to surgery.  Do not bring valuables to the hospital.  Contacts, dentures or bridgework may not be worn into surgery.  Leave suitcase in the car. After surgery it may be brought to your room.  For patients admitted to the hospital, checkout time is 11:00 AM the day of discharge.      SEE CHG INSTRUCTION SHEET    Please read over the following fact sheets that you were given: MRSA Information, Incentive Spirometry Fact Sheet, coughing and deep breathing exercises, leg exercises

## 2012-02-09 ENCOUNTER — Encounter (HOSPITAL_COMMUNITY)
Admission: RE | Admit: 2012-02-09 | Discharge: 2012-02-09 | Disposition: A | Discharge status: Home / Self Care | Payer: Medicare Other | Source: Ambulatory Visit | Attending: Surgery | Admitting: Surgery

## 2012-02-09 ENCOUNTER — Encounter (HOSPITAL_COMMUNITY): Payer: Self-pay

## 2012-02-09 HISTORY — DX: Unspecified osteoarthritis, unspecified site: M19.90

## 2012-02-09 LAB — SURGICAL PCR SCREEN
MRSA, PCR: NEGATIVE
Staphylococcus aureus: NEGATIVE

## 2012-02-09 LAB — COMPREHENSIVE METABOLIC PANEL
ALT: 9 U/L (ref 0–35)
AST: 13 U/L (ref 0–37)
Albumin: 3.3 g/dL — ABNORMAL LOW (ref 3.5–5.2)
Alkaline Phosphatase: 50 U/L (ref 39–117)
BUN: 12 mg/dL (ref 6–23)
CO2: 27 mEq/L (ref 19–32)
Calcium: 9.5 mg/dL (ref 8.4–10.5)
Chloride: 101 mEq/L (ref 96–112)
Creatinine, Ser: 0.73 mg/dL (ref 0.50–1.10)
GFR calc Af Amer: >90 mL/min (ref >90)
GFR calc non Af Amer: >90 mL/min (ref >90)
Glucose, Bld: 80 mg/dL (ref 70–99)
Potassium: 3.7 mEq/L (ref 3.5–5.1)
Sodium: 138 mEq/L (ref 135–145)
Total Bilirubin: 0.2 mg/dL — ABNORMAL LOW (ref 0.3–1.2)
Total Protein: 7.7 g/dL (ref 6.0–8.3)

## 2012-02-09 LAB — CBC WITH DIFFERENTIAL/PLATELET
Basophils Absolute: 0.1 10*3/uL (ref 0.0–0.1)
Basophils Relative: 1 % (ref 0–1)
Eosinophils Absolute: 0.1 10*3/uL (ref 0.0–0.7)
Eosinophils Relative: 1 % (ref 0–5)
HCT: 38.6 % (ref 36.0–46.0)
Hemoglobin: 11.9 g/dL — ABNORMAL LOW (ref 12.0–15.0)
Lymphocytes Relative: 33 % (ref 12–46)
Lymphs Abs: 3.9 10*3/uL (ref 0.7–4.0)
MCH: 24.4 pg — ABNORMAL LOW (ref 26.0–34.0)
MCHC: 30.8 g/dL (ref 30.0–36.0)
MCV: 79.1 fL (ref 78.0–100.0)
Monocytes Absolute: 0.7 10*3/uL (ref 0.1–1.0)
Monocytes Relative: 6 % (ref 3–12)
Neutro Abs: 7 10*3/uL (ref 1.7–7.7)
Neutrophils Relative %: 60 % (ref 43–77)
Platelets: 439 10*3/uL — ABNORMAL HIGH (ref 150–400)
RBC: 4.88 MIL/uL (ref 3.87–5.11)
RDW: 16.5 % — ABNORMAL HIGH (ref 11.5–15.5)
WBC: 11.7 10*3/uL — ABNORMAL HIGH (ref 4.0–10.5)

## 2012-02-09 MED ORDER — HEPARIN SODIUM (PORCINE) 5000 UNIT/ML IJ SOLN: 5000.0000 [IU] | INTRAMUSCULAR | Status: DC

## 2012-02-09 NOTE — Progress Notes (Signed)
Patient has not yet received Bowel Prep Instructions.  Patient was instructed to call office to obtain instructions.

## 2012-02-09 NOTE — Progress Notes (Signed)
Patient is to call [facility] regarding bowel prep instructions.  Patient voices understanding.

## 2012-02-10 NOTE — Progress Notes (Signed)
Per Penni Bombard at office Dr Daphine Deutscher will be in office on 02/11/12 and she will discuss Bowel Prep with MD and give patient a call regarding this .

## 2012-02-11 ENCOUNTER — Telehealth (INDEPENDENT_AMBULATORY_CARE_PROVIDER_SITE_OTHER): Payer: Self-pay | Admitting: General Surgery

## 2012-02-11 NOTE — Telephone Encounter (Signed)
LMOM letting pt know that I am mailing her bowel prep to her.  Informed her it will have 2 Rx's in it as well as instructions and that she should follow them as is.  Also informed her to call me back if she has any other questions for me.

## 2012-02-15 MED ORDER — HEPARIN SODIUM (PORCINE) 5000 UNIT/ML IJ SOLN: 5000.0000 [IU] | INTRAMUSCULAR | Status: DC

## 2012-02-15 MED ORDER — DEXTROSE 5 % IV SOLN
2.0000 g | INTRAVENOUS | Status: DC
  Filled 2012-02-15: qty 2

## 2012-02-16 ENCOUNTER — Ambulatory Visit (HOSPITAL_COMMUNITY): Payer: Medicare Other

## 2012-02-16 ENCOUNTER — Encounter (HOSPITAL_COMMUNITY): Payer: Self-pay | Admitting: Anesthesiology

## 2012-02-16 ENCOUNTER — Encounter (HOSPITAL_COMMUNITY)
Admission: RE | Disposition: A | Discharge status: Home / Self Care | Payer: Self-pay | Source: Ambulatory Visit | Attending: Surgery

## 2012-02-16 ENCOUNTER — Ambulatory Visit (HOSPITAL_COMMUNITY): Payer: Medicare Other | Admitting: Anesthesiology

## 2012-02-16 ENCOUNTER — Observation Stay (HOSPITAL_COMMUNITY)
Admission: RE | Admit: 2012-02-16 | Discharge: 2012-02-17 | Disposition: A | Discharge status: Home / Self Care | Payer: Medicare Other | Source: Ambulatory Visit | Attending: Surgery | Admitting: Surgery

## 2012-02-16 ENCOUNTER — Encounter (HOSPITAL_COMMUNITY): Payer: Self-pay | Admitting: *Deleted

## 2012-02-16 DIAGNOSIS — Z538 Procedure and treatment not carried out for other reasons: Secondary | ICD-10-CM | POA: Insufficient documentation

## 2012-02-16 DIAGNOSIS — Z23 Encounter for immunization: Secondary | ICD-10-CM | POA: Insufficient documentation

## 2012-02-16 DIAGNOSIS — Z79899 Other long term (current) drug therapy: Secondary | ICD-10-CM | POA: Insufficient documentation

## 2012-02-16 DIAGNOSIS — I10 Essential (primary) hypertension: Secondary | ICD-10-CM | POA: Insufficient documentation

## 2012-02-16 DIAGNOSIS — D131 Benign neoplasm of stomach: Secondary | ICD-10-CM

## 2012-02-16 DIAGNOSIS — Z6841 Body Mass Index (BMI) 40.0 and over, adult: Secondary | ICD-10-CM | POA: Insufficient documentation

## 2012-02-16 DIAGNOSIS — C49A2 Gastrointestinal stromal tumor of stomach: Secondary | ICD-10-CM | POA: Diagnosis present

## 2012-02-16 DIAGNOSIS — K219 Gastro-esophageal reflux disease without esophagitis: Secondary | ICD-10-CM | POA: Insufficient documentation

## 2012-02-16 HISTORY — PX: BIOPSY: SHX5522

## 2012-02-16 LAB — CBC
HCT: 39.1 % (ref 36.0–46.0)
Hemoglobin: 12.3 g/dL (ref 12.0–15.0)
MCH: 24.7 pg — ABNORMAL LOW (ref 26.0–34.0)
MCHC: 31.5 g/dL (ref 30.0–36.0)
MCV: 78.7 fL (ref 78.0–100.0)
Platelets: 465 10*3/uL — ABNORMAL HIGH (ref 150–400)
RBC: 4.97 MIL/uL (ref 3.87–5.11)
RDW: 16.7 % — ABNORMAL HIGH (ref 11.5–15.5)
WBC: 13.2 10*3/uL — ABNORMAL HIGH (ref 4.0–10.5)

## 2012-02-16 LAB — CREATININE, SERUM
Creatinine, Ser: 0.79 mg/dL (ref 0.50–1.10)
GFR calc Af Amer: >90 mL/min (ref >90)
GFR calc non Af Amer: >90 mL/min (ref >90)

## 2012-02-16 SURGERY — ENDOSCOPY, UPPER GI TRACT
Anesthesia: General | Site: Abdomen | Wound class: Clean Contaminated

## 2012-02-16 MED ORDER — CISATRACURIUM BESYLATE (PF) 10 MG/5ML IV SOLN
INTRAVENOUS | Status: DC | PRN
  Administered 2012-02-16: 8 mg via INTRAVENOUS
  Administered 2012-02-16: 4 mg via INTRAVENOUS

## 2012-02-16 MED ORDER — LAMOTRIGINE 100 MG PO TABS
100.0000 mg | ORAL_TABLET | Freq: Every day | ORAL | Status: DC
Start: 2012-02-16 — End: 2012-02-17
  Administered 2012-02-16: 100 mg via ORAL
  Filled 2012-02-16 (×2): qty 1

## 2012-02-16 MED ORDER — NYSTATIN 100000 UNIT/GM EX CREA
1.0000 "application " | TOPICAL_CREAM | Freq: Two times a day (BID) | CUTANEOUS | Status: DC
  Administered 2012-02-16 (×2): 1 via TOPICAL
  Filled 2012-02-16: qty 15

## 2012-02-16 MED ORDER — ACETAMINOPHEN 10 MG/ML IV SOLN
INTRAVENOUS | Status: DC | PRN
  Administered 2012-02-16: 1000 mg via INTRAVENOUS

## 2012-02-16 MED ORDER — POTASSIUM CHLORIDE IN NACL 20-0.45 MEQ/L-% IV SOLN
INTRAVENOUS | Status: DC
  Administered 2012-02-16: 16:00:00 via INTRAVENOUS
  Filled 2012-02-16 (×5): qty 1000

## 2012-02-16 MED ORDER — BUPIVACAINE LIPOSOME 1.3 % IJ SUSP
20.0000 mL | INTRAMUSCULAR | Status: AC
  Administered 2012-02-16: 20 mL
  Filled 2012-02-16: qty 20

## 2012-02-16 MED ORDER — DIVALPROEX SODIUM 125 MG PO CPSP
1000.0000 mg | ORAL_CAPSULE | ORAL | Status: DC
Start: 2012-02-17 — End: 2012-02-17
  Filled 2012-02-16: qty 8

## 2012-02-16 MED ORDER — PROPOFOL 10 MG/ML IV EMUL
INTRAVENOUS | Status: DC | PRN
  Administered 2012-02-16: 225 mg via INTRAVENOUS

## 2012-02-16 MED ORDER — LORATADINE 10 MG PO TABS
10.0000 mg | ORAL_TABLET | Freq: Every day | ORAL | Status: DC
  Administered 2012-02-16: 10 mg via ORAL
  Filled 2012-02-16 (×2): qty 1

## 2012-02-16 MED ORDER — ACETAMINOPHEN 10 MG/ML IV SOLN: 1000.0000 mg | Freq: Once | INTRAVENOUS | Status: DC | PRN

## 2012-02-16 MED ORDER — PROMETHAZINE HCL 25 MG/ML IJ SOLN: 6.2500 mg | INTRAMUSCULAR | Status: DC | PRN

## 2012-02-16 MED ORDER — GLYCOPYRROLATE 0.2 MG/ML IJ SOLN
INTRAMUSCULAR | Status: DC | PRN
  Administered 2012-02-16: 0.4 mg via INTRAVENOUS

## 2012-02-16 MED ORDER — ONDANSETRON HCL 4 MG/2ML IJ SOLN
INTRAMUSCULAR | Status: DC | PRN
  Administered 2012-02-16: 4 mg via INTRAVENOUS

## 2012-02-16 MED ORDER — SUCCINYLCHOLINE CHLORIDE 20 MG/ML IJ SOLN
INTRAMUSCULAR | Status: DC | PRN
  Administered 2012-02-16: 140 mg via INTRAVENOUS

## 2012-02-16 MED ORDER — DIVALPROEX SODIUM 125 MG PO CPSP: 500.0000 mg | ORAL_CAPSULE | ORAL | Status: DC

## 2012-02-16 MED ORDER — MEPERIDINE HCL 50 MG/ML IJ SOLN: 6.2500 mg | INTRAMUSCULAR | Status: DC | PRN

## 2012-02-16 MED ORDER — ALPRAZOLAM 1 MG PO TABS: 1.0000 mg | ORAL_TABLET | Freq: Two times a day (BID) | ORAL | Status: DC | PRN

## 2012-02-16 MED ORDER — FENTANYL CITRATE 0.05 MG/ML IJ SOLN
INTRAMUSCULAR | Status: DC | PRN
  Administered 2012-02-16: 50 ug via INTRAVENOUS
  Administered 2012-02-16: 150 ug via INTRAVENOUS
  Administered 2012-02-16 (×4): 50 ug via INTRAVENOUS

## 2012-02-16 MED ORDER — DEXTROSE 5 % IV SOLN
2.0000 g | INTRAVENOUS | Status: AC
  Administered 2012-02-16: 2 g via INTRAVENOUS

## 2012-02-16 MED ORDER — CEFOXITIN SODIUM-DEXTROSE 1-4 GM-% IV SOLR (PREMIX)
INTRAVENOUS | Status: AC
  Filled 2012-02-16: qty 100

## 2012-02-16 MED ORDER — HYDROCHLOROTHIAZIDE 25 MG PO TABS
25.0000 mg | ORAL_TABLET | Freq: Every morning | ORAL | Status: DC
  Administered 2012-02-16: 25 mg via ORAL
  Filled 2012-02-16 (×3): qty 1

## 2012-02-16 MED ORDER — OXYCODONE HCL 5 MG PO TABS: 5.0000 mg | ORAL_TABLET | ORAL | Status: DC | PRN

## 2012-02-16 MED ORDER — INFLUENZA VIRUS VACC SPLIT PF IM SUSP
0.5000 mL | INTRAMUSCULAR | Status: AC
  Administered 2012-02-17: 0.5 mL via INTRAMUSCULAR
  Filled 2012-02-16 (×2): qty 0.5

## 2012-02-16 MED ORDER — HEPARIN SODIUM (PORCINE) 5000 UNIT/ML IJ SOLN
5000.0000 [IU] | INTRAMUSCULAR | Status: AC
  Administered 2012-02-16: 5000 [IU] via SUBCUTANEOUS
  Filled 2012-02-16: qty 1

## 2012-02-16 MED ORDER — IOHEXOL 300 MG/ML  SOLN
100.0000 mL | Freq: Once | INTRAMUSCULAR | Status: AC | PRN
  Administered 2012-02-16: 100 mL via INTRAVENOUS

## 2012-02-16 MED ORDER — HYDROCODONE-ACETAMINOPHEN 5-325 MG PO TABS
1.0000 | ORAL_TABLET | ORAL | Status: DC | PRN
  Administered 2012-02-16 – 2012-02-17 (×2): 1 via ORAL
  Filled 2012-02-16 (×2): qty 1

## 2012-02-16 MED ORDER — DEXAMETHASONE SODIUM PHOSPHATE 10 MG/ML IJ SOLN
INTRAMUSCULAR | Status: DC | PRN
  Administered 2012-02-16: 10 mg via INTRAVENOUS

## 2012-02-16 MED ORDER — LACTATED RINGERS IV SOLN
INTRAVENOUS | Status: DC
  Administered 2012-02-16: 1000 mL via INTRAVENOUS
  Administered 2012-02-16 (×2): via INTRAVENOUS

## 2012-02-16 MED ORDER — DIVALPROEX SODIUM 125 MG PO CPSP
1000.0000 mg | ORAL_CAPSULE | Freq: Every day | ORAL | Status: DC
  Administered 2012-02-16: 1000 mg via ORAL
  Filled 2012-02-16 (×2): qty 8

## 2012-02-16 MED ORDER — MIDAZOLAM HCL 5 MG/5ML IJ SOLN
INTRAMUSCULAR | Status: DC | PRN
  Administered 2012-02-16 (×2): 1 mg via INTRAVENOUS

## 2012-02-16 MED ORDER — LIDOCAINE HCL (CARDIAC) 20 MG/ML IV SOLN
INTRAVENOUS | Status: DC | PRN
  Administered 2012-02-16: 100 mg via INTRAVENOUS

## 2012-02-16 MED ORDER — PANTOPRAZOLE SODIUM 40 MG PO TBEC
40.0000 mg | DELAYED_RELEASE_TABLET | Freq: Every day | ORAL | Status: DC
  Administered 2012-02-16: 40 mg via ORAL
  Filled 2012-02-16 (×2): qty 1

## 2012-02-16 MED ORDER — OXYCODONE HCL 5 MG PO TABS
5.0000 mg | ORAL_TABLET | Freq: Once | ORAL | Status: DC | PRN
Start: 2012-02-16 — End: 2012-02-16

## 2012-02-16 MED ORDER — OXYCODONE HCL 5 MG/5ML PO SOLN
5.0000 mg | Freq: Once | ORAL | Status: DC | PRN
  Filled 2012-02-16: qty 5

## 2012-02-16 MED ORDER — HYDROMORPHONE HCL PF 1 MG/ML IJ SOLN: 0.2500 mg | INTRAMUSCULAR | Status: DC | PRN

## 2012-02-16 MED ORDER — FLUTICASONE PROPIONATE 50 MCG/ACT NA SUSP
2.0000 | Freq: Every day | NASAL | Status: DC
  Administered 2012-02-16: 2 via NASAL
  Filled 2012-02-16: qty 16

## 2012-02-16 MED ORDER — LISINOPRIL 20 MG PO TABS
20.0000 mg | ORAL_TABLET | Freq: Every day | ORAL | Status: DC
  Administered 2012-02-16: 20 mg via ORAL
  Filled 2012-02-16 (×2): qty 1

## 2012-02-16 MED ORDER — SODIUM CHLORIDE 0.9 % IJ SOLN
INTRAMUSCULAR | Status: DC | PRN
  Administered 2012-02-16: 20 mL via INTRAVENOUS

## 2012-02-16 MED ORDER — ACETAMINOPHEN 10 MG/ML IV SOLN
INTRAVENOUS | Status: AC
  Filled 2012-02-16: qty 100

## 2012-02-16 MED ORDER — HEPARIN SODIUM (PORCINE) 5000 UNIT/ML IJ SOLN
5000.0000 [IU] | Freq: Three times a day (TID) | INTRAMUSCULAR | Status: DC
  Administered 2012-02-16 – 2012-02-17 (×2): 5000 [IU] via SUBCUTANEOUS
  Filled 2012-02-16 (×5): qty 1

## 2012-02-16 MED ORDER — NEOSTIGMINE METHYLSULFATE 1 MG/ML IJ SOLN
INTRAMUSCULAR | Status: DC | PRN
  Administered 2012-02-16: 4 mg via INTRAVENOUS

## 2012-02-16 MED ORDER — MORPHINE SULFATE 2 MG/ML IJ SOLN
1.0000 mg | INTRAMUSCULAR | Status: DC | PRN
  Administered 2012-02-16 (×2): 1 mg via INTRAVENOUS
  Filled 2012-02-16 (×3): qty 1

## 2012-02-16 MED ORDER — DIVALPROEX SODIUM 125 MG PO DR TAB: 125.0000 mg | DELAYED_RELEASE_TABLET | Freq: Two times a day (BID) | ORAL | Status: DC

## 2012-02-16 SURGICAL SUPPLY — 62 items
ADH SKN CLS APL DERMABOND .7 (GAUZE/BANDAGES/DRESSINGS) ×2
APL SKNCLS STERI-STRIP NONHPOA (GAUZE/BANDAGES/DRESSINGS)
BENZOIN TINCTURE PRP APPL 2/3 (GAUZE/BANDAGES/DRESSINGS) IMPLANT
BLADE HEX COATED 2.75 (ELECTRODE) ×3 IMPLANT
BLADE SURG 15 STRL LF DISP TIS (BLADE) IMPLANT
BLADE SURG 15 STRL SS (BLADE)
CANISTER SUCTION 2500CC (MISCELLANEOUS) ×3 IMPLANT
CLOTH BEACON ORANGE TIMEOUT ST (SAFETY) ×3 IMPLANT
COVER SURGICAL LIGHT HANDLE (MISCELLANEOUS) ×3 IMPLANT
DECANTER SPIKE VIAL GLASS SM (MISCELLANEOUS) ×6 IMPLANT
DERMABOND ADVANCED (GAUZE/BANDAGES/DRESSINGS) ×1
DERMABOND ADVANCED .7 DNX12 (GAUZE/BANDAGES/DRESSINGS) ×2 IMPLANT
DEVICE SUT QUICK LOAD TK 5 (STAPLE) ×9 IMPLANT
DEVICE SUT TI-KNOT TK 5X26 (MISCELLANEOUS) ×3 IMPLANT
DEVICE SUTURE ENDOST 10MM (ENDOMECHANICALS) ×3 IMPLANT
DISSECTOR BLUNT TIP ENDO 5MM (MISCELLANEOUS) IMPLANT
DRAPE CAMERA CLOSED 9X96 (DRAPES) ×3 IMPLANT
DRAPE LG THREE QUARTER DISP (DRAPES) ×6 IMPLANT
ELECT REM PT RETURN 9FT ADLT (ELECTROSURGICAL) ×3
ELECTRODE REM PT RTRN 9FT ADLT (ELECTROSURGICAL) ×2 IMPLANT
GLOVE BIOGEL M 8.0 STRL (GLOVE) ×3 IMPLANT
GLOVE BIOGEL PI IND STRL 7.0 (GLOVE) ×2 IMPLANT
GLOVE BIOGEL PI INDICATOR 7.0 (GLOVE) ×1
GOWN STRL NON-REIN LRG LVL3 (GOWN DISPOSABLE) ×6 IMPLANT
GOWN STRL REIN XL XLG (GOWN DISPOSABLE) ×9 IMPLANT
HOVERMATT SINGLE USE (MISCELLANEOUS) ×3 IMPLANT
KIT BASIN OR (CUSTOM PROCEDURE TRAY) ×3 IMPLANT
NEEDLE SPNL 22GX3.5 QUINCKE BK (NEEDLE) ×3 IMPLANT
NS IRRIG 1000ML POUR BTL (IV SOLUTION) ×3 IMPLANT
PACK UNIVERSAL I (CUSTOM PROCEDURE TRAY) ×3 IMPLANT
PENCIL BUTTON HOLSTER BLD 10FT (ELECTRODE) ×3 IMPLANT
SCALPEL HARMONIC ACE (MISCELLANEOUS) ×3 IMPLANT
SET IRRIG TUBING LAPAROSCOPIC (IRRIGATION / IRRIGATOR) IMPLANT
SHEARS CURVED HARMONIC AC 45CM (MISCELLANEOUS) ×3 IMPLANT
SLEEVE ADV FIXATION 5X100MM (TROCAR) ×6 IMPLANT
SLEEVE Z-THREAD 5X100MM (TROCAR) IMPLANT
SOLUTION ANTI FOG 6CC (MISCELLANEOUS) ×3 IMPLANT
SPONGE GAUZE 4X4 12PLY (GAUZE/BANDAGES/DRESSINGS) ×3 IMPLANT
SPONGE LAP 18X18 X RAY DECT (DISPOSABLE) ×3 IMPLANT
STAPLER VISISTAT 35W (STAPLE) ×3 IMPLANT
STRIP CLOSURE SKIN 1/2X4 (GAUZE/BANDAGES/DRESSINGS) IMPLANT
SUT ETHIBOND 2 0 SH (SUTURE) ×9
SUT ETHIBOND 2 0 SH 36X2 (SUTURE) ×6 IMPLANT
SUT PROLENE 2 0 CT2 30 (SUTURE) ×3 IMPLANT
SUT SILK 0 (SUTURE)
SUT SILK 0 30XBRD TIE 6 (SUTURE) IMPLANT
SUT SURGIDAC NAB ES-9 0 48 120 (SUTURE) ×3 IMPLANT
SUT VIC AB 2-0 SH 27 (SUTURE)
SUT VIC AB 2-0 SH 27X BRD (SUTURE) IMPLANT
SUT VIC AB 4-0 SH 18 (SUTURE) ×3 IMPLANT
SYR 20CC LL (SYRINGE) ×3 IMPLANT
SYR 30ML LL (SYRINGE) ×3 IMPLANT
SYS KII OPTICAL ACCESS 15MM (TROCAR) ×3
SYSTEM KII OPTICAL ACCESS 15MM (TROCAR) ×2 IMPLANT
TOWEL OR 17X26 10 PK STRL BLUE (TOWEL DISPOSABLE) ×6 IMPLANT
TROCAR ADV FIXATION 11X100MM (TROCAR) IMPLANT
TROCAR XCEL NON-BLD 11X100MML (ENDOMECHANICALS) ×3 IMPLANT
TROCAR Z-THREAD FIOS 11X100 BL (TROCAR) IMPLANT
TROCAR Z-THREAD FIOS 5X100MM (TROCAR) ×3 IMPLANT
TROCAR Z-THREAD SLEEVE 11X100 (TROCAR) IMPLANT
TUBE CALIBRATION LAPBAND (TUBING) ×3 IMPLANT
TUBING INSUFFLATION 10FT LAP (TUBING) ×3 IMPLANT

## 2012-02-16 NOTE — Anesthesia Postprocedure Evaluation (Signed)
Anesthesia Post Note  Patient: Crystal Garza  Procedure(s) Performed: Procedure(s) (LRB): UPPER GI ENDOSCOPY () LAPAROSCOPIC ABDOMINAL EXPLORATION () BIOPSY ()  Anesthesia type: General  Patient location: PACU  Post pain: Pain level controlled  Post assessment: Post-op Vital signs reviewed  Last Vitals: BP 134/73  Pulse 100  Temp 36.8 C  Resp 22  Wt 391 lb 8 oz (177.583 kg)  SpO2 97%  Post vital signs: Reviewed  Level of consciousness: sedated  Complications: No apparent anesthesia complications

## 2012-02-16 NOTE — Op Note (Signed)
Surgeon: Wenda Low, MD, FACS  Asst:  Lodema Pilot, DO  Anes:  general  Procedure: Laparoscopy and upper endoscopy with biopsies of EG junction submucosal mass  Diagnosis: Path pending  Complications: none  EBL:   minimal cc  Description of Procedure:  The patient was taken to room 1 and given general anesthesia. The abdomen was prepped prepped with chloroxylenol and draped sterilely. Timeout was performed. Access to the abdomen was achieved to the left upper quadrant using a 0 10 mm Optiview.  Total of 5 ports were used including a 15 placed obliquely over on the right side. Nathanson retractor retracted left lateral segment. I began the procedure assessing the EG junction for hiatal hernia. The calibrating balloon was passed by anesthesia and inflated with 10 cc of air. Pulling this back it showed that he did have at the EG junction indicating that this was competent. However at the time of doing this there was a fullness noted anterior laterally at the EG junction which looked to be at least 2 cm to 3 cm in diameter. After the calibration tubing was removed I was able to palpate this and there was a mass at the GE junction.  I had to call endoscopy and waited for the endocart proceeded to do an upper endoscopy. On retroflexion you could see this mass which occupied about fourth of the circumference and was a prominent indentation right at the GE junction. There was a separate nodule that was more visible mucosa. Both of these were biopsied with the endoscopic biopsy forceps.  Endoscope was withdrawn. Port sites were injected with Exparel. The trochars were removed as was the [facility]. The wounds were closed 4-0 Vicryl and the Dermabond.  Crystal B. Daphine Deutscher, MD, [facility], [place] [phone]

## 2012-02-16 NOTE — Preoperative (Signed)
Beta Blockers   Reason not to administer Beta Blockers:Not Applicable 

## 2012-02-16 NOTE — H&P (Signed)
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 She know about the lapband and what will be required. She is scheduled for Tuesday, October 15. She is ready and has no further questions  Past Medical History   Diagnosis  Date   .  Anemia      iron def   .  Hypertension    .  Morbid obesity    .  GERD (gastroesophageal reflux disease)    .  Anxiety    .  Depression    .  Migraine    .  Back pain    .  Chronic knee pain     Past Surgical History   Procedure  Date   .  Abdominal hysterectomy    .  Knee arthroscopy    .  Cesarean section  05/1991   .  Breath tek h pylori  07/28/2011     Procedure: BREATH TEK H PYLORI; Surgeon: Valarie Merino, MD; Location: [facility]; Service: General; Laterality: N/A; to be done at 745    Current Outpatient Prescriptions   Medication  Sig  Dispense  Refill   .  ALPRAZolam (XANAX) 1 MG tablet  Take 1 mg by mouth 2 (two) times daily as needed.     .  divalproex (DEPAKOTE) 500 MG DR tablet  Take 1,000 mg by mouth 2 (two) times daily. 2 tabs every other morning and 2 tabs at bedtime daily per Dr. Tomasa Rand     .  fluticasone [facility]) 50 MCG/ACT nasal spray  Place 2 sprays into the nose daily.     .  hydrochlorothiazide (HYDRODIURIL) 25 MG tablet  Take 25 mg by mouth every morning.     Marland Kitchen  HYDROcodone-acetaminophen (LORTAB) 7.5-500 MG per tablet  Take 1 tablet by mouth every 6 (six) hours as needed. Pain     .  lamoTRIgine (LAMICTAL) 100 MG tablet  Take 100 mg by mouth at bedtime.     Marland Kitchen  lisinopril (PRINIVIL,ZESTRIL) 20 MG tablet  Take 20 mg by mouth at bedtime.     Marland Kitchen  loratadine (CLARITIN) 10 MG tablet  Take 10 mg by mouth daily.     Marland Kitchen  nystatin cream (MYCOSTATIN)  Apply 1 application topically 2 (two) times daily.     Marland Kitchen  omeprazole (PRILOSEC) 40 MG capsule  Take 40 mg by mouth daily.     .  promethazine (PHENERGAN) 25 MG tablet   Take 25 mg by mouth every 6 (six) hours as needed. for nausea/vomiting     .  triamcinolone ointment (KENALOG) 0.1 %  Apply 1 application topically 2 (two) times daily.      Codeine  Family History   Problem  Relation  Age of Onset   .  Diabetes  Mother    .  Cancer  Father       brain tumor, colon    .  Diabetes  Father    .  Heart disease  Father    .  Heart disease  Paternal Uncle    .  Diabetes  Paternal Uncle    .  Diabetes  Maternal Grandmother    .  Heart disease  Paternal Grandmother    .  Heart disease  Paternal Grandfather    .  Diabetes  Maternal Aunt    .  Diabetes  Maternal  Uncle    .  Diabetes  Paternal Aunt    .  Diabetes  Maternal Grandfather     Social History: reports that she has never smoked. She has never used smokeless tobacco. She reports that she drinks alcohol. She reports that she does not use illicit drugs.  REVIEW OF SYSTEMS - PERTINENT POSITIVES ONLY:  See above  Physical Exam:  Blood pressure 128/80, pulse 118, temperature 97.8 F (36.6 C), temperature source Temporal, resp. rate 16, height 5\' 7"  (1.702 m), weight 397 lb 9.6 oz (180.35 kg).  Body mass index is 62.27 kg/(m^2).  Gen: WDWN AAF NAD  Neurological: Alert and oriented to person, place, and time. Motor and sensory function is grossly intact  Head: Normocephalic and atraumatic.  Eyes: Conjunctivae are normal. Pupils are equal, round, and reactive to light. No scleral icterus.  Neck: Normal range of motion. Neck supple. No tracheal deviation or thyromegaly present.  Cardiovascular: SR without murmurs or gallops. No carotid bruits  Respiratory: Effort normal. No respiratory distress. No chest wall tenderness. Breath sounds normal. No wheezes, rales or rhonchi.  Abdomen: Obese abdomen. Nontender  GU:  Musculoskeletal: Normal range of motion. Extremities are nontender. No cyanosis, edema or clubbing noted Lymphadenopathy: No cervical, preauricular, postauricular or axillary adenopathy is  present Skin: Skin is warm and dry. No rash noted. No diaphoresis. No erythema. No pallor. Pscyh: Normal mood and affect. Behavior is normal. Judgment and thought content normal.  LABORATORY RESULTS:  No results found for this or any previous visit (from the past 48 hour(s)).  RADIOLOGY RESULTS:  No results found.  Problem List:  Patient Active Problem List   Diagnosis   .  Morbid obesity   .  DEPRESSION, MAJOR, RECURRENT   .  MIGRAINE, CHRONIC   .  HYPERTENSION, BENIGN SYSTEMIC   .  RHINITIS, ALLERGIC   .  GASTROESOPHAGEAL REFLUX, NO ESOPHAGITIS   .  OSTEOARTHRITIS, KNEES, BILATERAL   .  Iron deficiency anemia   .  Anxiety   .  Back pain   .  Eczematous dermatitis   .  Arm paresthesia, left   .  Nocturia   .  Nephrolithiasis   .  Need for prophylactic vaccination and inoculation against influenza   .  Skin yeast infection   .  Peripheral edema   .  Encounter for annual health examination   .  Bipolar disorder    Assessment & Plan:  Morbid obesity  Lapband  [name], MD, [facility]  [phone] beeper  [phone]  There has been no change in the patient's past medical history or physical exam in the past 24 hours to the best of my knowledge. I examined the patient in the holding area and have made any changes to the history and physical exam report that is included above.   Expectations and outcome results have been discussed with the patient to include risks and benefits.  All questions have been answered and we will proceed with previously discussed procedure noted and signed in the consent form in the patient's record.    Madelina Sanda BMD FACS 9:59 AM  02/16/2012

## 2012-02-16 NOTE — Anesthesia Preprocedure Evaluation (Addendum)
Anesthesia Evaluation  Patient identified by MRN, date of birth, ID band Patient awake    Reviewed: Allergy & Precautions, H&P , NPO status , Patient's Chart, lab work & pertinent test results  Airway Mallampati: I TM Distance: >3 FB Neck ROM: Full    Dental  (+) Dental Advisory Given, Edentulous Upper and Partial Lower   Pulmonary neg pulmonary ROS,  breath sounds clear to auscultation  Pulmonary exam normal       Cardiovascular hypertension, Pt. on medications - Past MI and - CHF Rhythm:Regular Rate:Normal     Neuro/Psych  Headaches, PSYCHIATRIC DISORDERS Anxiety Depression    GI/Hepatic GERD-  ,  Endo/Other  Morbid obesity  Renal/GU Renal disease     Musculoskeletal  (+) Arthritis -, Osteoarthritis,    Abdominal (+) + obese,   Peds  Hematology  (+) Blood dyscrasia, anemia ,   Anesthesia Other Findings   Reproductive/Obstetrics                         Anesthesia Physical Anesthesia Plan  ASA: III  Anesthesia Plan: General   Post-op Pain Management:    Induction: Intravenous  Airway Management Planned: Oral ETT  Additional Equipment:   Intra-op Plan:   Post-operative Plan: Extubation in OR  Informed Consent: I have reviewed the patients History and Physical, chart, labs and discussed the procedure including the risks, benefits and alternatives for the proposed anesthesia with the patient or authorized representative who has indicated his/her understanding and acceptance.   Dental advisory given  Plan Discussed with: CRNA  Anesthesia Plan Comments:         Anesthesia Quick Evaluation

## 2012-02-16 NOTE — Anesthesia Procedure Notes (Signed)
Procedure Name: Intubation Date/Time: 02/16/2012 10:23 AM Performed by: Edison Pace Pre-anesthesia Checklist: Patient identified, Timeout performed, Emergency Drugs available, Suction available and Patient being monitored Patient Re-evaluated:Patient Re-evaluated prior to inductionOxygen Delivery Method: Circle system utilized Preoxygenation: Pre-oxygenation with 100% oxygen Intubation Type: IV induction Ventilation: Mask ventilation without difficulty Grade View: Grade I Tube type: Oral Tube size: 7.5 mm Number of attempts: 1 Airway Equipment and Method: Stylet

## 2012-02-16 NOTE — Transfer of Care (Signed)
Immediate Anesthesia Transfer of Care Note  Patient: Crystal Garza  Procedure(s) Performed: Procedure(s) (LRB) with comments: UPPER GI ENDOSCOPY () LAPAROSCOPIC ABDOMINAL EXPLORATION () BIOPSY () - biopsy of mass x 2  Patient Location: PACU  Anesthesia Type: General  Level of Consciousness: awake, alert , oriented and patient cooperative  Airway & Oxygen Therapy: Patient Spontanous Breathing and Patient connected to face mask oxygen  Post-op Assessment: Report given to PACU RN, Post -op Vital signs reviewed and stable and Patient moving all extremities  Post vital signs: Reviewed and stable  Complications: No apparent anesthesia complications

## 2012-02-17 ENCOUNTER — Encounter (HOSPITAL_COMMUNITY): Payer: Self-pay | Admitting: Surgery

## 2012-02-17 DIAGNOSIS — C49A2 Gastrointestinal stromal tumor of stomach: Secondary | ICD-10-CM | POA: Diagnosis present

## 2012-02-17 MED ORDER — OXYCODONE HCL 5 MG PO TABS: 5.0000 mg | ORAL_TABLET | ORAL | Status: DC | PRN

## 2012-02-17 NOTE — Progress Notes (Signed)
Pt discharged to home with daughter provided discharge instructions and prescriptions along with handouts. Pt verbalized understanding of discharge information. Pt stable. Pt transported by [facility] IV removed and documented. Annitta Needs, RN

## 2012-02-17 NOTE — Care Management Note (Signed)
    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     Action/Plan:   Anticipated DC Date:  02/17/2012   Anticipated DC Plan:  HOME/SELF CARE      DC Planning Services  CM consult      Choice offered to / List presented to:             Status of service:  Completed, signed off Medicare Important Message given?   (If response is "NO", the following Medicare IM given date fields will be blank) Date Medicare IM given:   Date Additional Medicare IM given:    Discharge Disposition:  HOME/SELF CARE  Per UR Regulation:  Reviewed for med. necessity/level of care/duration of stay  If discussed at Long Length of Stay Meetings, dates discussed:    Comments:

## 2012-02-17 NOTE — Discharge Summary (Signed)
Physician Discharge Summary  Patient ID: Crystal Garza MRN: [id number] DOB/AGE: 10/29/1970 41 y.o.  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   Surgery:  Laparoscopy, endoscopy with mucosal biopsies  Discharged Condition: good  Hospital Course:   Had surgery and CT scan.  Ready for discharge on PD !  Consults: none  Significant Diagnostic Studies: CT scan: Clinical Data: 41 year old female with epigastric pain and mid  abdominal pain. EG junction mass identified on endoscopy with  biopsies taken.  CT ABDOMEN AND PELVIS WITH CONTRAST  Technique: Multidetector CT imaging of the abdomen and pelvis was  performed following the standard protocol during bolus  administration of intravenous contrast.  Contrast: OMNIPAQUE IOHEXOL 300 MG/ML SOLN  Comparison: 06/05/2011 CT  Findings: Very mild bibasilar atelectasis noted.  The liver, spleen, pancreas, adrenal glands and, kidneys and  gallbladder are unremarkable.  No free fluid, enlarged lymph nodes, biliary dilation or abdominal  aortic aneurysm identified.  The stomach is not distended and the known EG junction mass is  difficult to visualize on this study.  No other bowel abnormalities are identified.  The appendix is normal.  A 3 cm left ovarian cyst is present.  No other masses are noted.  There is no evidence of pneumoperitoneum or bowel obstruction.  No acute or suspicious bony abnormalities are identified.  IMPRESSION:  Known EG junction mass not well visualized on this study. No  evidence of lymphadenopathy or metastatic disease.  3 cm left ovarian cyst. Consider further evaluation and  characterization with pelvic ultrasound.  No other significant  abnormalities noted.     Discharge Exam: Blood pressure 100/70, pulse 93, temperature 98.3 F (36.8 C), temperature source Oral, resp. rate 20, height 5\' 7"  (1.702 m), weight 391 lb 8 oz (177.583 kg), SpO2 98.00%. Minimal pain.  Incisions ok  Disposition: 06-Home-Health Care Svc  Discharge Orders    Future Appointments: Provider: Department: Dept Phone: Center:   03/01/2012 4:00 PM [facility] [phone] NDM   03/02/2012 3:30 PM Valarie Merino, MD [facility] [phone] None     Future Orders Please Complete By Expires   Diet Carb Modified      Increase activity slowly      Discharge instructions      Comments:   Will need to set up endoscopic ultrasound and biopsy of mass  after discharge.   No wound care          Medication List     As of 02/17/2012  7:59 AM    STOP taking these medications         erythromycin 250 MG tablet   Commonly known as: ERYTHROCIN      TAKE these medications         ALPRAZolam 1 MG tablet   Commonly known as: XANAX   Take 1 mg by mouth 2 (two) times daily as needed.      divalproex 500 MG DR tablet   Commonly known as: DEPAKOTE   Take 125 mg by mouth 2 (two) times daily. Sprinkle kind of medication - patient takes 8 packets every nite and then alternating 4 packets and 8 packets every other day in the am ( patient states there are 125 mg in each  packet )      fluticasone 50 MCG/ACT nasal spray   Commonly known as: FLONASE   Place 2 sprays into the nose daily.      hydrochlorothiazide 25 MG tablet   Commonly known as: HYDRODIURIL   Take 25 mg by mouth every morning.      HYDROcodone-acetaminophen 7.5-500 MG per tablet   Commonly known as: LORTAB   Take 1 tablet by mouth every 6 (six) hours as needed. Pain      lamoTRIgine 100 MG tablet   Commonly known as: LAMICTAL   Take 100 mg by mouth at bedtime.      lisinopril 20 MG tablet   Commonly known as: PRINIVIL,ZESTRIL   Take 20 mg by  mouth at bedtime.      loratadine 10 MG tablet   Commonly known as: CLARITIN   Take 10 mg by mouth daily.      nystatin cream   Commonly known as: MYCOSTATIN   Apply 1 application topically 2 (two) times daily. Applies under breasts      omeprazole 40 MG capsule   Commonly known as: PRILOSEC   Take 40 mg by mouth daily.      oxyCODONE 5 MG immediate release tablet   Commonly known as: Oxy IR/ROXICODONE   Take 1 tablet (5 mg total) by mouth every 4 (four) hours as needed.      promethazine 25 MG tablet   Commonly known as: PHENERGAN   Take 25 mg by mouth every 6 (six) hours as needed. for nausea/vomiting      triamcinolone ointment 0.1 %   Commonly known as: KENALOG   Apply 1 application topically 2 (two) times daily. Applies to left arm           Follow-up Information    Follow up with Valarie Merino, MD.   Contact information:   [address] [place] [address] [phone]       Follow up with Rob Bunting, MD. (My office will schedule appt for endoscopic ultrasound)    Contact information:   [address] [place] [address] [phone]          Signed: Valarie Merino 02/17/2012, 7:59 AM

## 2012-02-18 ENCOUNTER — Other Ambulatory Visit (INDEPENDENT_AMBULATORY_CARE_PROVIDER_SITE_OTHER): Payer: Self-pay | Admitting: Surgery

## 2012-02-18 ENCOUNTER — Telehealth: Payer: Self-pay | Admitting: Gastroenterology

## 2012-02-18 DIAGNOSIS — Z9889 Other specified postprocedural states: Secondary | ICD-10-CM

## 2012-02-18 DIAGNOSIS — K319 Disease of stomach and duodenum, unspecified: Secondary | ICD-10-CM

## 2012-02-18 NOTE — Telephone Encounter (Signed)
Yes, she needs upper EUS, radial +/- linear, ++Mac sedation for narcotic usage, morbid obesity, dx; proximal gastric lesion, next availa EUS Thursday  Thanks

## 2012-02-19 ENCOUNTER — Telehealth: Payer: Self-pay | Admitting: Gastroenterology

## 2012-02-19 ENCOUNTER — Other Ambulatory Visit: Payer: Self-pay

## 2012-02-19 DIAGNOSIS — K319 Disease of stomach and duodenum, unspecified: Secondary | ICD-10-CM

## 2012-02-19 NOTE — Telephone Encounter (Signed)
See alternate note  

## 2012-02-19 NOTE — Telephone Encounter (Signed)
Pt has been instructed and meds reviewed.  Pt will call with any further questions after reviewing mailed info

## 2012-02-19 NOTE — Telephone Encounter (Signed)
Left message on machine to call back  

## 2012-02-25 ENCOUNTER — Encounter (INDEPENDENT_AMBULATORY_CARE_PROVIDER_SITE_OTHER): Payer: Self-pay | Admitting: Surgery

## 2012-02-29 ENCOUNTER — Other Ambulatory Visit: Payer: Self-pay | Admitting: *Deleted

## 2012-03-01 MED ORDER — HYDROCODONE-ACETAMINOPHEN 7.5-500 MG PO TABS: 1.0000 | ORAL_TABLET | Freq: Four times a day (QID) | ORAL | Status: DC | PRN

## 2012-03-01 NOTE — Telephone Encounter (Signed)
Left message on patient voicemail informing that rx was ready to be picked up. 

## 2012-03-01 NOTE — Telephone Encounter (Signed)
Please let patient know Rx is available for pick up. Thank you! Amber M. Hairford, M.D.

## 2012-03-02 ENCOUNTER — Encounter (HOSPITAL_COMMUNITY): Payer: Self-pay | Admitting: *Deleted

## 2012-03-02 ENCOUNTER — Ambulatory Visit (INDEPENDENT_AMBULATORY_CARE_PROVIDER_SITE_OTHER): Payer: Medicare Other | Admitting: Surgery

## 2012-03-02 ENCOUNTER — Encounter (INDEPENDENT_AMBULATORY_CARE_PROVIDER_SITE_OTHER): Payer: Self-pay | Admitting: Surgery

## 2012-03-02 VITALS — BP 126/64 | HR 84 | Temp 97.0°F | Resp 20 | Ht 67.0 in | Wt 393.6 lb

## 2012-03-02 DIAGNOSIS — D490 Neoplasm of unspecified behavior of digestive system: Secondary | ICD-10-CM

## 2012-03-02 NOTE — Patient Instructions (Signed)
Have endoscopic ultrasound tomorrow and we will decide upon the reoperative plan depending on the pathology determination

## 2012-03-02 NOTE — Progress Notes (Signed)
Crystal Garza 41 y.o.  Body mass index is 61.65 kg/(m^2).  Patient Active Problem List  Diagnosis  . Morbid obesity-BMI 62  . DEPRESSION, MAJOR, RECURRENT  . MIGRAINE, CHRONIC  . HYPERTENSION, BENIGN SYSTEMIC  . RHINITIS, ALLERGIC  . GASTROESOPHAGEAL REFLUX, NO ESOPHAGITIS  . OSTEOARTHRITIS, KNEES, BILATERAL  . Iron deficiency anemia  . Anxiety  . Back pain  . Eczematous dermatitis  . Arm paresthesia, left  . Nocturia  . Nephrolithiasis  . Need for prophylactic vaccination and inoculation against influenza  . Skin yeast infection  . Peripheral edema  . Encounter for annual health examination  . Bipolar disorder  . 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    Allergies  Allergen Reactions  . Codeine Rash    Past Surgical History  Procedure Date  . Abdominal hysterectomy   . Knee arthroscopy   . Cesarean section 05/1991  . Breath tek h pylori 07/28/2011    Procedure: BREATH TEK H PYLORI;  Surgeon: Eller Sweis B Laquetta Racey, MD;  Location: [facility];  Service: General;  Laterality: N/A;  to be done at 745  . Esophageal biopsy 02/16/2012    Procedure: BIOPSY;  Surgeon: Quintell Bonnin B Dejanira Pamintuan, MD;  Location: [facility];  Service: General;;  biopsy of mass x 2   HAIRFORD, AMBER, MD No diagnosis found.  Doing well from surgery.  Having endoscopic ultrasound tomorrow per Dr. Jacobs.  Will formulate plan after that.   [name], MD, FACS  [facility] [phone] beeper [phone]  03/02/2012 4:13 PM  

## 2012-03-03 ENCOUNTER — Encounter (HOSPITAL_COMMUNITY): Payer: Self-pay | Admitting: Anesthesiology

## 2012-03-03 ENCOUNTER — Ambulatory Visit (HOSPITAL_COMMUNITY)
Admission: RE | Admit: 2012-03-03 | Discharge: 2012-03-03 | Disposition: A | Discharge status: Home / Self Care | Payer: Medicare Other | Source: Ambulatory Visit | Attending: Gastroenterology | Admitting: Gastroenterology

## 2012-03-03 ENCOUNTER — Ambulatory Visit (HOSPITAL_COMMUNITY): Payer: Medicare Other | Admitting: Anesthesiology

## 2012-03-03 ENCOUNTER — Encounter (HOSPITAL_COMMUNITY)
Admission: RE | Disposition: A | Discharge status: Home / Self Care | Payer: Self-pay | Source: Ambulatory Visit | Attending: Gastroenterology

## 2012-03-03 ENCOUNTER — Encounter (HOSPITAL_COMMUNITY): Payer: Self-pay

## 2012-03-03 DIAGNOSIS — K219 Gastro-esophageal reflux disease without esophagitis: Secondary | ICD-10-CM | POA: Insufficient documentation

## 2012-03-03 DIAGNOSIS — K319 Disease of stomach and duodenum, unspecified: Secondary | ICD-10-CM

## 2012-03-03 DIAGNOSIS — I10 Essential (primary) hypertension: Secondary | ICD-10-CM | POA: Insufficient documentation

## 2012-03-03 HISTORY — PX: EUS: SHX5427

## 2012-03-03 SURGERY — UPPER ENDOSCOPIC ULTRASOUND (EUS) LINEAR
Anesthesia: Monitor Anesthesia Care

## 2012-03-03 MED ORDER — MIDAZOLAM HCL 5 MG/5ML IJ SOLN
INTRAMUSCULAR | Status: DC | PRN
  Administered 2012-03-03: 2 mg via INTRAVENOUS

## 2012-03-03 MED ORDER — PROPOFOL 10 MG/ML IV BOLUS
INTRAVENOUS | Status: DC | PRN
  Administered 2012-03-03 (×2): 20 mg via INTRAVENOUS

## 2012-03-03 MED ORDER — KETAMINE HCL 50 MG/ML IJ SOLN
INTRAMUSCULAR | Status: DC | PRN
  Administered 2012-03-03 (×2): 25 mg via INTRAMUSCULAR

## 2012-03-03 MED ORDER — BUTAMBEN-TETRACAINE-BENZOCAINE 2-2-14 % EX AERO
INHALATION_SPRAY | CUTANEOUS | Status: DC | PRN
  Administered 2012-03-03: 2 via TOPICAL

## 2012-03-03 MED ORDER — PROPOFOL 10 MG/ML IV EMUL
INTRAVENOUS | Status: DC | PRN
  Administered 2012-03-03: 100 ug/kg/min via INTRAVENOUS

## 2012-03-03 MED ORDER — LACTATED RINGERS IV SOLN
INTRAVENOUS | Status: DC
  Administered 2012-03-03: 1000 mL via INTRAVENOUS

## 2012-03-03 MED ORDER — SODIUM CHLORIDE 0.9 % IV SOLN: INTRAVENOUS | Status: DC

## 2012-03-03 MED ORDER — FENTANYL CITRATE 0.05 MG/ML IJ SOLN
INTRAMUSCULAR | Status: DC | PRN
  Administered 2012-03-03 (×4): 25 ug via INTRAVENOUS

## 2012-03-03 MED ORDER — FENTANYL CITRATE 0.05 MG/ML IJ SOLN: 25.0000 ug | INTRAMUSCULAR | Status: DC | PRN

## 2012-03-03 NOTE — Anesthesia Preprocedure Evaluation (Addendum)
Anesthesia Evaluation  Patient identified by MRN, date of birth, ID band Patient awake    Reviewed: Allergy & Precautions, H&P , NPO status , Patient's Chart, lab work & pertinent test results  Airway Mallampati: II TM Distance: >3 FB Neck ROM: full    Dental  (+) Edentulous Upper and Dental Advisory Given   Pulmonary neg pulmonary ROS,  breath sounds clear to auscultation  Pulmonary exam normal       Cardiovascular Exercise Tolerance: Poor hypertension, Pt. on medications Rhythm:regular Rate:Normal     Neuro/Psych  Headaches, Anxiety Depression Bipolar Disorder negative neurological ROS  negative psych ROS   GI/Hepatic negative GI ROS, Neg liver ROS, GERD-  Medicated and Controlled,  Endo/Other  negative endocrine ROSMorbid obesity  Renal/GU negative Renal ROS  negative genitourinary   Musculoskeletal   Abdominal (+) + obese,   Peds  Hematology negative hematology ROS (+)   Anesthesia Other Findings   Reproductive/Obstetrics negative OB ROS                          Anesthesia Physical Anesthesia Plan  ASA: III  Anesthesia Plan: MAC   Post-op Pain Management:    Induction:   Airway Management Planned:   Additional Equipment:   Intra-op Plan:   Post-operative Plan:   Informed Consent: I have reviewed the patients History and Physical, chart, labs and discussed the procedure including the risks, benefits and alternatives for the proposed anesthesia with the patient or authorized representative who has indicated his/her understanding and acceptance.   Dental Advisory Given  Plan Discussed with: Surgeon  Anesthesia Plan Comments:         Anesthesia Quick Evaluation

## 2012-03-03 NOTE — Transfer of Care (Signed)
Immediate Anesthesia Transfer of Care Note  Patient: Crystal Garza  Procedure(s) Performed: Procedure(s) (LRB): UPPER ENDOSCOPIC ULTRASOUND (EUS) LINEAR (N/A)  Patient Location: PACU  Anesthesia Type: MAC  Level of Consciousness: sedated, patient cooperative and responds to stimulaton  Airway & Oxygen Therapy: Patient Spontanous Breathing and Patient connected to face mask oxgen  Post-op Assessment: Report given to PACU RN and Post -op Vital signs reviewed and stable  Post vital signs: Reviewed and stable  Complications: No apparent anesthesia complications

## 2012-03-03 NOTE — Op Note (Signed)
[facility] [address] [place], [address]   ENDOSCOPIC ULTRASOUND PROCEDURE REPORT  PATIENT: Crystal Garza, Crystal Garza  MR#: [id number] BIRTHDATE: 1970/05/20  GENDER: Female ENDOSCOPIST: Rachael Fee, MD REFERRED BY:  Luretha Murphy, M.D. PROCEDURE DATE:  03/03/2012 PROCEDURE:   Upper EUS w/FNA ASA CLASS:      Class IV INDICATIONS:   1.  recent proximal gastric subepithelial mass seen by EGD (Dr.  Daphine Deutscher). MEDICATIONS: MAC sedation, administered by CRNA  DESCRIPTION OF PROCEDURE:   After the risks benefits and alternatives of the procedure were  explained, informed consent was obtained. The patient was then placed in the left, lateral, decubitus postion and IV sedation was administered. Throughout the procedure, the patients blood pressure, pulse and oxygen saturations were monitored continuously.  Under direct visualization, the EUS scope  endoscope was introduced through the mouth  and advanced to the descending duodenum .  Water was used as necessary to provide an acoustic interface.  Upon completion of the imaging, water was removed and the patient was sent to the recovery room in satisfactory condition.   Endoscopic findings (limited views with radial and linear echoendoscope): 1.  I could not see the subepithelial lesion endoscopically with either linear or radial scope due to long, inflexible tip which precluded proper retroflex views.  EUS findings: 1. Hypoechoic, round, crisply marginated solid mass that measures 1.75cm maximally and clearly communicates with the muscularis propria layer of the proximal gastric wall (about 1-2cm from GE junction). This lesion was sampled with single pass of 25 guage BS EUS FNA needle. 2. Otherwise, EUS examination of pancreas, liver spleen, gastric wall was normal  Impression: 1.75cm lesion that clearly communicates with muscularis propria layer of proximal gastric wall. This is likey a GIST, FNA performed.  Await final cytology.  I favor resection of this gastric lesion, especially since it could occupy a signficant percent of her eventual gastric pouch if she undergoes gastric bypass.    _______________________________ eSignedRachael Fee, MD 03/03/2012 12:31 PM

## 2012-03-03 NOTE — Anesthesia Postprocedure Evaluation (Signed)
  Anesthesia Post-op Note  Patient: Crystal Garza  Procedure(s) Performed: Procedure(s) (LRB): UPPER ENDOSCOPIC ULTRASOUND (EUS) LINEAR (N/A)  Patient Location: PACU  Anesthesia Type: MAC  Level of Consciousness: awake and alert   Airway and Oxygen Therapy: Patient Spontanous Breathing  Post-op Pain: mild  Post-op Assessment: Post-op Vital signs reviewed, Patient's Cardiovascular Status Stable, Respiratory Function Stable, Patent Airway and No signs of Nausea or vomiting  Post-op Vital Signs: stable  Complications: No apparent anesthesia complications

## 2012-03-03 NOTE — Progress Notes (Signed)
Patient's breath sounds are clear in all segments anterior and posterior but are  Decreased due to excessive weight.Patient's bowel sounds present all quadrents on arrival to endo recovery .

## 2012-03-03 NOTE — Interval H&P Note (Signed)
History and Physical Interval Note:  03/03/2012 11:42 AM  Crystal Garza  has presented today for surgery, with the diagnosis of Gastric lesion [537.89]  The various methods of treatment have been discussed with the patient and family. After consideration of risks, benefits and other options for treatment, the patient has consented to  Procedure(s) (LRB) with comments: UPPER ENDOSCOPIC ULTRASOUND (EUS) LINEAR (N/A) as a surgical intervention .  The patient's history has been reviewed, patient examined, no change in status, stable for surgery.  I have reviewed the patient's chart and labs.  Questions were answered to the patient's satisfaction.     Rob Bunting

## 2012-03-03 NOTE — H&P (View-Only) (Signed)
Crystal Garza 41 y.o.  Body mass index is 61.65 kg/(m^2).  Patient Active Problem List  Diagnosis  . Morbid obesity-BMI 62  . DEPRESSION, MAJOR, RECURRENT  . MIGRAINE, CHRONIC  . HYPERTENSION, BENIGN SYSTEMIC  . RHINITIS, ALLERGIC  . GASTROESOPHAGEAL REFLUX, NO ESOPHAGITIS  . OSTEOARTHRITIS, KNEES, BILATERAL  . Iron deficiency anemia  . Anxiety  . Back pain  . Eczematous dermatitis  . Arm paresthesia, left  . Nocturia  . Nephrolithiasis  . Need for prophylactic vaccination and inoculation against influenza  . Skin yeast infection  . Peripheral edema  . Encounter for annual health examination  . Bipolar disorder  . 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    Allergies  Allergen Reactions  . Codeine Rash    Past Surgical History  Procedure Date  . Abdominal hysterectomy   . Knee arthroscopy   . Cesarean section 05/1991  . Breath tek h pylori 07/28/2011    Procedure: BREATH TEK H PYLORI;  Surgeon: Valarie Merino, MD;  Location: [facility];  Service: General;  Laterality: N/A;  to be done at 745  . Esophageal biopsy 02/16/2012    Procedure: BIOPSY;  Surgeon: Valarie Merino, MD;  Location: [facility];  Service: General;;  biopsy of mass x 2   HAIRFORD, AMBER, MD No diagnosis found.  Doing well from surgery.  Having endoscopic ultrasound tomorrow per Dr. Christella Hartigan.  Will formulate plan after that.   [name], MD, [facility] [phone] beeper [phone]  03/02/2012 4:13 PM

## 2012-03-04 ENCOUNTER — Encounter (HOSPITAL_COMMUNITY): Payer: Self-pay | Admitting: Gastroenterology

## 2012-03-04 ENCOUNTER — Telehealth (INDEPENDENT_AMBULATORY_CARE_PROVIDER_SITE_OTHER): Payer: Self-pay | Admitting: General Surgery

## 2012-03-04 NOTE — Telephone Encounter (Signed)
Spoke with pt and explained to her that after speaking w/ Dr. Daphine Deutscher about her mass that he would like to remove the mass first and let her heal before we move on with the bariatric procedure.  She said she understood this and was okay with it.  I informed her that I would get the paperwork going on her surgery orders for this procedure and that our surgery scheduler will be calling her next to set this up.

## 2012-03-07 ENCOUNTER — Other Ambulatory Visit: Payer: Self-pay

## 2012-03-07 ENCOUNTER — Telehealth: Payer: Self-pay

## 2012-03-07 DIAGNOSIS — R933 Abnormal findings on diagnostic imaging of other parts of digestive tract: Secondary | ICD-10-CM

## 2012-03-07 NOTE — Telephone Encounter (Signed)
Blanca to notify pt  

## 2012-03-07 NOTE — Telephone Encounter (Signed)
Message copied by Donata Duff on Mon Mar 07, 2012  9:19 AM ------      Message from: Marnette Burgess      Created: Mon Mar 07, 2012  8:58 AM       Patient is already scheduled to see Dr. Luretha Murphy on 03/25/12 @ 3:30pm, arrive @ 3:15pm.  If you have any questions please call [phone].            Thank You,      Elane Fritz      ----- Message -----         From: Donata Duff, CMA         Sent: 03/07/2012   8:32 AM           To: Marnette Burgess            Pt needs appt with Dr Daphine Deutscher to discuss resection

## 2012-03-24 ENCOUNTER — Encounter: Payer: Self-pay | Admitting: Gastroenterology

## 2012-03-24 NOTE — Telephone Encounter (Signed)
Error

## 2012-03-25 ENCOUNTER — Ambulatory Visit (INDEPENDENT_AMBULATORY_CARE_PROVIDER_SITE_OTHER): Payer: Medicare Other | Admitting: Surgery

## 2012-03-25 ENCOUNTER — Encounter (INDEPENDENT_AMBULATORY_CARE_PROVIDER_SITE_OTHER): Payer: Self-pay | Admitting: Surgery

## 2012-03-25 VITALS — BP 140/86 | HR 84 | Temp 97.0°F | Resp 20 | Ht 67.0 in | Wt 386.8 lb

## 2012-03-25 DIAGNOSIS — D490 Neoplasm of unspecified behavior of digestive system: Secondary | ICD-10-CM

## 2012-03-25 NOTE — Patient Instructions (Signed)

## 2012-03-25 NOTE — Progress Notes (Signed)
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 female had attempted lap band which time we found a mass at her GE junction. This was sampled by Dr. Dan Jacobs and felt to probably be a Gist tumor. We are point to try to room removed that and possibly to a lap band or a gastric bypass. She also realized we may have to two-stage this and do the Gist tumor first for doing a lap band or bypass. We'll proceed with surgery on December 6.  Past Medical History  Diagnosis Date  . Anemia     iron def  . Hypertension   . Morbid obesity   . GERD (gastroesophageal reflux disease)   . Anxiety   . Depression   . Migraine   . Back pain   . Chronic knee pain   . Arthritis     left knee   . Hx of laparoscopic gastric banding     Past Surgical History  Procedure Date  . Abdominal hysterectomy   . Knee arthroscopy   . Cesarean section 05/1991  . Breath tek h pylori 07/28/2011    Procedure: BREATH TEK H PYLORI;  Surgeon: Duglas Heier B Lota Leamer, MD;  Location: [facility];  Service: General;  Laterality: N/A;  to be done at 745  . Esophageal biopsy 02/16/2012    Procedure: BIOPSY;  Surgeon: Aunisty Reali B Nikira Kushnir, MD;  Location: [facility];  Service: General;;  biopsy of mass x 2  . Eus 03/03/2012    Procedure: UPPER ENDOSCOPIC ULTRASOUND (EUS) LINEAR;  Surgeon: Daniel P Jacobs, MD;  Location: [facility];  Service: Endoscopy;  Laterality: N/A;  . Laparoscopic gastric banding     Current Outpatient Prescriptions  Medication Sig Dispense Refill  . ALPRAZolam (XANAX) 1 MG tablet Take 1 mg by mouth 2 (two) times daily as needed.      . divalproex (DEPAKOTE) 500 MG DR tablet Take 125 mg by mouth 2 (two) times daily. Sprinkle kind of medication - patient takes 8 packets every nite and then alternating 4 packets and 8 packets every other day in the am ( patient states there are 125 mg in each packet )      . fluticasone (FLONASE) 50 MCG/ACT  nasal spray Place 2 sprays into the nose daily.      . hydrochlorothiazide (HYDRODIURIL) 25 MG tablet Take 25 mg by mouth every morning.      . HYDROcodone-acetaminophen (LORTAB) 7.5-500 MG per tablet Take 1 tablet by mouth every 6 (six) hours as needed. Pain  30 tablet  2  . lamoTRIgine (LAMICTAL) 100 MG tablet Take 100 mg by mouth at bedtime.      . lisinopril (PRINIVIL,ZESTRIL) 20 MG tablet Take 20 mg by mouth at bedtime.      . loratadine (CLARITIN) 10 MG tablet Take 10 mg by mouth daily.      . nystatin cream (MYCOSTATIN) Apply 1 application topically 2 (two) times daily. Applies under breasts      . omeprazole (PRILOSEC) 40 MG capsule Take 40 mg by mouth daily.      . oxyCODONE (OXY IR/ROXICODONE) 5 MG immediate release tablet Take 1 tablet (5 mg total) by mouth every 4 (four) hours as needed.  30 tablet  0  . promethazine (PHENERGAN) 25 MG tablet Take 25 mg by mouth every 6 (six) hours as needed. for nausea/vomiting      . tiZANidine (ZANAFLEX) 4   MG tablet       . triamcinolone ointment (KENALOG) 0.1 % Apply 1 application topically 2 (two) times daily. Applies to left arm       Codeine Family History  Problem Relation Age of Onset  . Diabetes Mother   . Cancer Father     brain tumor, colon  . Diabetes Father   . Heart disease Father   . Heart disease Paternal Uncle   . Diabetes Paternal Uncle   . Diabetes Maternal Grandmother   . Heart disease Paternal Grandmother   . Heart disease Paternal Grandfather   . Diabetes Maternal Aunt   . Diabetes Maternal Uncle   . Diabetes Paternal Aunt   . Diabetes Maternal Grandfather    Social History:   reports that she has never smoked. She has never used smokeless tobacco. She reports that she drinks alcohol. She reports that she does not use illicit drugs.   REVIEW OF SYSTEMS - PERTINENT POSITIVES ONLY: noncontributory  Physical Exam:   Blood pressure 140/86, pulse 84, temperature 97 F (36.1 C), temperature source Temporal, resp.  rate 20, height 5' 7" (1.702 m), weight 386 lb 12.8 oz (175.451 kg). Body mass index is 60.58 kg/(m^2).  Gen:  WDWN AAF NAD  Neurological: Alert and oriented to person, place, and time. Motor and sensory function is grossly intact  Head: Normocephalic and atraumatic.  Eyes: Conjunctivae are normal. Pupils are equal, round, and reactive to light. No scleral icterus.  Neck: Normal range of motion. Neck supple. No tracheal deviation or thyromegaly present.  Cardiovascular:  SR without murmurs or gallops.  No carotid bruits Respiratory: Effort normal.  No respiratory distress. No chest wall tenderness. Breath sounds normal.  No wheezes, rales or rhonchi.  Abdomen:  Obese with healing trocar sites GU: Musculoskeletal: Normal range of motion. Extremities are nontender. No cyanosis, edema or clubbing noted Lymphadenopathy: No cervical, preauricular, postauricular or axillary adenopathy is present Skin: Skin is warm and dry. No rash noted. No diaphoresis. No erythema. No pallor. Pscyh: Normal mood and affect. Behavior is normal. Judgment and thought content normal.   LABORATORY RESULTS: No results found for this or any previous visit (from the past 48 hour(s)).  RADIOLOGY RESULTS: No results found.  Problem List: Patient Active Problem List  Diagnosis  . Morbid obesity-BMI 62  . DEPRESSION, MAJOR, RECURRENT  . MIGRAINE, CHRONIC  . HYPERTENSION, BENIGN SYSTEMIC  . RHINITIS, ALLERGIC  . GASTROESOPHAGEAL REFLUX, NO ESOPHAGITIS  . OSTEOARTHRITIS, KNEES, BILATERAL  . Iron deficiency anemia  . Anxiety  . Back pain  . Eczematous dermatitis  . Arm paresthesia, left  . Nocturia  . Nephrolithiasis  . Need for prophylactic vaccination and inoculation against influenza  . Skin yeast infection  . Peripheral edema  . Encounter for annual health examination  . Bipolar disorder  . 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    Assessment &  Plan: Will resect GIST and try to single stage bariatric procedure but may need a two stage.     Matt B. Rashawna Scoles, MD, FACS  [facility] [phone] beeper [phone]  03/25/2012 4:10 PM     

## 2012-03-28 ENCOUNTER — Telehealth (INDEPENDENT_AMBULATORY_CARE_PROVIDER_SITE_OTHER): Payer: Self-pay | Admitting: General Surgery

## 2012-03-28 NOTE — Telephone Encounter (Signed)
Spoke with pt and informed her that her first PO appt will be on 12/26 at 4:40

## 2012-03-28 NOTE — Patient Instructions (Signed)
[name]  03/28/2012   Your procedure is scheduled on: 04/08/12 1000am-148pm  Report to [facility] at 0730 AM.  Call this number if you have problems the morning of surgery: [phone]   Remember: Fleets enema nite before surgery    Do not eat food:After Midnight.  May have clear liquids:until Midnight .  Marland Kitchen  Take these medicines the morning of surgery with A SIP OF WATER:    Do not wear jewelry, make-up or nail polish.  Do not wear lotions, powders, or perfumes.  Do not shave 48 hours prior to surgery.   Do not bring valuables to the hospital.  Contacts, dentures or bridgework may not be worn into surgery.  Leave suitcase in the car. After surgery it may be brought to your room.  For patients admitted to the hospital, checkout time is 11:00 AM the day of discharge.             SEE CHG INSTRUCTION SHEET    Please read over the following fact sheets that you were given: MRSA Information, coughing and deep breathing exercises, leg exercises , Blood Transfusion Fact Sheet

## 2012-03-29 ENCOUNTER — Inpatient Hospital Stay (HOSPITAL_COMMUNITY): Admission: RE | Admit: 2012-03-29 | Discharge: 2012-03-29 | Payer: Medicare Other | Source: Ambulatory Visit

## 2012-03-29 ENCOUNTER — Encounter (HOSPITAL_COMMUNITY): Payer: Self-pay | Admitting: Pharmacy Technician

## 2012-03-29 NOTE — Patient Instructions (Addendum)
20 NATALYA DOMZALSKI  03/29/2012   Your procedure is scheduled on:  04/08/12 1000am-148pm  Report to [facility] at 0730 AM.  Call this number if you have problems the morning of surgery: [phone]   Remember: Fleets enema nite before surgery    Do not eat food:After Midnight.  May have clear liquids:until Midnight .    Take these medicines the morning of surgery with A SIP OF WATER: Prilosec, Claritin,Depakote sprinkles , may use Flonase nasal spray   Do not wear jewelry, make-up or nail polish.  Do not wear lotions, powders, or perfumes.   Do not shave 48 hours prior to surgery.   Do not bring valuables to the hospital.  Contacts, dentures or bridgework may not be worn into surgery.  Leave suitcase in the car. After surgery it may be brought to your room.  For patients admitted to the hospital, checkout time is 11:00 AM the day of discharge.              SEE CHG INSTRUCTION SHEET    Please read over the following fact sheets that you were given: MRSA Information, coughing and deep breathing exercises, leg exercises, Blood Transfusion Fact Sheet

## 2012-04-06 ENCOUNTER — Encounter (HOSPITAL_COMMUNITY): Payer: Self-pay

## 2012-04-06 ENCOUNTER — Encounter (HOSPITAL_COMMUNITY)
Admission: RE | Admit: 2012-04-06 | Discharge: 2012-04-06 | Disposition: A | Discharge status: Home / Self Care | Payer: Medicare Other | Source: Ambulatory Visit | Attending: Surgery | Admitting: Surgery

## 2012-04-06 ENCOUNTER — Telehealth (INDEPENDENT_AMBULATORY_CARE_PROVIDER_SITE_OTHER): Payer: Self-pay | Admitting: General Surgery

## 2012-04-06 LAB — BASIC METABOLIC PANEL
BUN: 11 mg/dL (ref 6–23)
CO2: 27 mEq/L (ref 19–32)
Calcium: 9.6 mg/dL (ref 8.4–10.5)
Chloride: 103 mEq/L (ref 96–112)
Creatinine, Ser: 0.93 mg/dL (ref 0.50–1.10)
GFR calc Af Amer: 86 mL/min — ABNORMAL LOW (ref >90)
GFR calc non Af Amer: 74 mL/min — ABNORMAL LOW (ref >90)
Glucose, Bld: 88 mg/dL (ref 70–99)
Potassium: 3.8 mEq/L (ref 3.5–5.1)
Sodium: 139 mEq/L (ref 135–145)

## 2012-04-06 LAB — CBC
HCT: 38.3 % (ref 36.0–46.0)
Hemoglobin: 12 g/dL (ref 12.0–15.0)
MCH: 24.8 pg — ABNORMAL LOW (ref 26.0–34.0)
MCHC: 31.3 g/dL (ref 30.0–36.0)
MCV: 79.1 fL (ref 78.0–100.0)
Platelets: 550 10*3/uL — ABNORMAL HIGH (ref 150–400)
RBC: 4.84 MIL/uL (ref 3.87–5.11)
RDW: 15.7 % — ABNORMAL HIGH (ref 11.5–15.5)
WBC: 10.9 10*3/uL — ABNORMAL HIGH (ref 4.0–10.5)

## 2012-04-06 LAB — ABO/RH: ABO/RH(D): B POS

## 2012-04-06 LAB — SURGICAL PCR SCREEN
MRSA, PCR: NEGATIVE
Staphylococcus aureus: NEGATIVE

## 2012-04-06 NOTE — Telephone Encounter (Signed)
Crystal Garza called back regarding colon prep instructions. I reviewed instructions for clear liquids day before surgery and fleets enema evening before surgery/gy

## 2012-04-06 NOTE — Telephone Encounter (Signed)
Beverly from pre-admit called to check if pt needed a bowel prep for surgery on Friday? She gave me pt's cell # [phone]. I checked with Dr. Daphine Deutscher and he said for pt to take clear liquids day before surgery and a fleets enema pm of surgery/. I left a message on pt's cell # with these instructions/ gy

## 2012-04-08 ENCOUNTER — Encounter (HOSPITAL_COMMUNITY): Payer: Self-pay | Admitting: Anesthesiology

## 2012-04-08 ENCOUNTER — Encounter (HOSPITAL_COMMUNITY)
Admission: RE | Disposition: A | Discharge status: Home / Self Care | Payer: Self-pay | Source: Ambulatory Visit | Attending: Surgery

## 2012-04-08 ENCOUNTER — Encounter (HOSPITAL_COMMUNITY): Payer: Self-pay | Admitting: *Deleted

## 2012-04-08 ENCOUNTER — Inpatient Hospital Stay (HOSPITAL_COMMUNITY): Payer: Medicare Other | Admitting: Anesthesiology

## 2012-04-08 ENCOUNTER — Observation Stay (HOSPITAL_COMMUNITY)
Admission: RE | Admit: 2012-04-08 | Discharge: 2012-04-10 | Disposition: A | Discharge status: Home / Self Care | Payer: Medicare Other | Source: Ambulatory Visit | Attending: Surgery | Admitting: Surgery

## 2012-04-08 DIAGNOSIS — I10 Essential (primary) hypertension: Secondary | ICD-10-CM | POA: Insufficient documentation

## 2012-04-08 DIAGNOSIS — Z01812 Encounter for preprocedural laboratory examination: Secondary | ICD-10-CM | POA: Insufficient documentation

## 2012-04-08 DIAGNOSIS — D509 Iron deficiency anemia, unspecified: Secondary | ICD-10-CM | POA: Insufficient documentation

## 2012-04-08 DIAGNOSIS — Z79899 Other long term (current) drug therapy: Secondary | ICD-10-CM | POA: Insufficient documentation

## 2012-04-08 DIAGNOSIS — D4819 Other specified neoplasm of uncertain behavior of connective and other soft tissue: Principal | ICD-10-CM | POA: Insufficient documentation

## 2012-04-08 DIAGNOSIS — D481 Neoplasm of uncertain behavior of connective and other soft tissue: Principal | ICD-10-CM | POA: Insufficient documentation

## 2012-04-08 DIAGNOSIS — K219 Gastro-esophageal reflux disease without esophagitis: Secondary | ICD-10-CM | POA: Insufficient documentation

## 2012-04-08 DIAGNOSIS — Z9884 Bariatric surgery status: Secondary | ICD-10-CM | POA: Insufficient documentation

## 2012-04-08 DIAGNOSIS — Z90710 Acquired absence of both cervix and uterus: Secondary | ICD-10-CM | POA: Insufficient documentation

## 2012-04-08 DIAGNOSIS — M1710 Unilateral primary osteoarthritis, unspecified knee: Secondary | ICD-10-CM | POA: Insufficient documentation

## 2012-04-08 DIAGNOSIS — D214 Benign neoplasm of connective and other soft tissue of abdomen: Secondary | ICD-10-CM

## 2012-04-08 DIAGNOSIS — D490 Neoplasm of unspecified behavior of digestive system: Secondary | ICD-10-CM

## 2012-04-08 DIAGNOSIS — C49A2 Gastrointestinal stromal tumor of stomach: Secondary | ICD-10-CM | POA: Diagnosis present

## 2012-04-08 HISTORY — PX: LAPAROSCOPIC GASTRECTOMY: SHX5894

## 2012-04-08 LAB — CBC
HCT: 36 % (ref 36.0–46.0)
Hemoglobin: 11.3 g/dL — ABNORMAL LOW (ref 12.0–15.0)
MCH: 25 pg — ABNORMAL LOW (ref 26.0–34.0)
MCHC: 31.4 g/dL (ref 30.0–36.0)
MCV: 79.6 fL (ref 78.0–100.0)
Platelets: 504 10*3/uL — ABNORMAL HIGH (ref 150–400)
RBC: 4.52 MIL/uL (ref 3.87–5.11)
RDW: 15.8 % — ABNORMAL HIGH (ref 11.5–15.5)
WBC: 17.8 10*3/uL — ABNORMAL HIGH (ref 4.0–10.5)

## 2012-04-08 LAB — TYPE AND SCREEN
ABO/RH(D): B POS
Antibody Screen: NEGATIVE

## 2012-04-08 LAB — CREATININE, SERUM
Creatinine, Ser: 0.78 mg/dL (ref 0.50–1.10)
GFR calc Af Amer: >90 mL/min (ref >90)
GFR calc non Af Amer: >90 mL/min (ref >90)

## 2012-04-08 SURGERY — GASTRECTOMY, LAPAROSCOPIC
Anesthesia: General | Site: Abdomen | Wound class: Clean Contaminated

## 2012-04-08 MED ORDER — FLEET ENEMA 7-19 GM/118ML RE ENEM
1.0000 | ENEMA | Freq: Once | RECTAL | Status: AC
  Administered 2012-04-08: 09:00:00 via RECTAL
  Filled 2012-04-08: qty 1

## 2012-04-08 MED ORDER — LACTATED RINGERS IR SOLN
Status: DC | PRN
  Administered 2012-04-08: 1000 mL

## 2012-04-08 MED ORDER — HEPARIN SODIUM (PORCINE) 5000 UNIT/ML IJ SOLN
5000.0000 [IU] | Freq: Three times a day (TID) | INTRAMUSCULAR | Status: DC
  Administered 2012-04-08 – 2012-04-10 (×6): 5000 [IU] via SUBCUTANEOUS
  Filled 2012-04-08 (×9): qty 1

## 2012-04-08 MED ORDER — ACETAMINOPHEN 10 MG/ML IV SOLN
INTRAVENOUS | Status: DC | PRN
  Administered 2012-04-08: 1000 mg via INTRAVENOUS

## 2012-04-08 MED ORDER — FENTANYL CITRATE 0.05 MG/ML IJ SOLN
INTRAMUSCULAR | Status: DC | PRN
  Administered 2012-04-08: 100 ug via INTRAVENOUS
  Administered 2012-04-08 (×7): 50 ug via INTRAVENOUS

## 2012-04-08 MED ORDER — CEFOXITIN SODIUM 2 G IV SOLR
2.0000 g | INTRAVENOUS | Status: AC
  Administered 2012-04-08: 2 g via INTRAVENOUS
  Filled 2012-04-08: qty 2

## 2012-04-08 MED ORDER — HEPARIN SODIUM (PORCINE) 5000 UNIT/ML IJ SOLN
5000.0000 [IU] | Freq: Once | INTRAMUSCULAR | Status: AC
  Administered 2012-04-08: 5000 [IU] via SUBCUTANEOUS
  Filled 2012-04-08: qty 1

## 2012-04-08 MED ORDER — MIDAZOLAM HCL 5 MG/5ML IJ SOLN
INTRAMUSCULAR | Status: DC | PRN
  Administered 2012-04-08 (×2): 1 mg via INTRAVENOUS

## 2012-04-08 MED ORDER — ONDANSETRON HCL 4 MG/2ML IJ SOLN
INTRAMUSCULAR | Status: DC | PRN
  Administered 2012-04-08: 4 mg via INTRAVENOUS

## 2012-04-08 MED ORDER — ONDANSETRON HCL 4 MG PO TABS: 4.0000 mg | ORAL_TABLET | Freq: Four times a day (QID) | ORAL | Status: DC | PRN

## 2012-04-08 MED ORDER — KETAMINE HCL 10 MG/ML IJ SOLN
INTRAMUSCULAR | Status: DC | PRN
  Administered 2012-04-08: 10 mg via INTRAVENOUS
  Administered 2012-04-08: 30 mg via INTRAVENOUS
  Administered 2012-04-08: 10 mg via INTRAVENOUS

## 2012-04-08 MED ORDER — PROMETHAZINE HCL 25 MG/ML IJ SOLN
12.5000 mg | Freq: Four times a day (QID) | INTRAMUSCULAR | Status: DC | PRN
  Administered 2012-04-08: 25 mg via INTRAVENOUS
  Filled 2012-04-08: qty 1

## 2012-04-08 MED ORDER — DIPHENHYDRAMINE HCL 50 MG/ML IJ SOLN: 12.5000 mg | Freq: Four times a day (QID) | INTRAMUSCULAR | Status: DC | PRN

## 2012-04-08 MED ORDER — KCL IN DEXTROSE-NACL 20-5-0.45 MEQ/L-%-% IV SOLN
INTRAVENOUS | Status: DC
  Administered 2012-04-08 – 2012-04-09 (×2): via INTRAVENOUS
  Filled 2012-04-08 (×4): qty 1000

## 2012-04-08 MED ORDER — NEOSTIGMINE METHYLSULFATE 1 MG/ML IJ SOLN
INTRAMUSCULAR | Status: DC | PRN
  Administered 2012-04-08: 5 mg via INTRAVENOUS

## 2012-04-08 MED ORDER — SUCCINYLCHOLINE CHLORIDE 20 MG/ML IJ SOLN
INTRAMUSCULAR | Status: DC | PRN
  Administered 2012-04-08: 140 mg via INTRAVENOUS

## 2012-04-08 MED ORDER — LACTATED RINGERS IV SOLN: INTRAVENOUS | Status: DC | PRN

## 2012-04-08 MED ORDER — SODIUM CHLORIDE 0.9 % IJ SOLN
INTRAMUSCULAR | Status: DC | PRN
  Administered 2012-04-08: 20 mL

## 2012-04-08 MED ORDER — BUPIVACAINE LIPOSOME 1.3 % IJ SUSP
20.0000 mL | Freq: Once | INTRAMUSCULAR | Status: AC
  Administered 2012-04-08: 20 mL
  Filled 2012-04-08: qty 20

## 2012-04-08 MED ORDER — PROPOFOL 10 MG/ML IV BOLUS
INTRAVENOUS | Status: DC | PRN
  Administered 2012-04-08: 250 mg via INTRAVENOUS

## 2012-04-08 MED ORDER — METOCLOPRAMIDE HCL 5 MG/ML IJ SOLN: 5.0000 mg | Freq: Four times a day (QID) | INTRAMUSCULAR | Status: DC | PRN

## 2012-04-08 MED ORDER — HYDROMORPHONE HCL PF 1 MG/ML IJ SOLN
0.2500 mg | INTRAMUSCULAR | Status: DC | PRN
  Administered 2012-04-08 (×4): 0.5 mg via INTRAVENOUS

## 2012-04-08 MED ORDER — OXYCODONE-ACETAMINOPHEN 5-325 MG/5ML PO SOLN: 10.0000 mL | ORAL | Status: DC | PRN

## 2012-04-08 MED ORDER — LACTATED RINGERS IV SOLN
INTRAVENOUS | Status: DC
  Administered 2012-04-08: 1000 mL via INTRAVENOUS
  Administered 2012-04-08 (×2): via INTRAVENOUS

## 2012-04-08 MED ORDER — LIDOCAINE HCL (CARDIAC) 20 MG/ML IV SOLN
INTRAVENOUS | Status: DC | PRN
  Administered 2012-04-08: 80 mg via INTRAVENOUS

## 2012-04-08 MED ORDER — HYDROMORPHONE HCL PF 1 MG/ML IJ SOLN
0.5000 mg | INTRAMUSCULAR | Status: DC | PRN
  Administered 2012-04-08 – 2012-04-09 (×4): 0.5 mg via INTRAVENOUS
  Filled 2012-04-08 (×4): qty 1

## 2012-04-08 MED ORDER — GLYCOPYRROLATE 0.2 MG/ML IJ SOLN
INTRAMUSCULAR | Status: DC | PRN
  Administered 2012-04-08: 0.6 mg via INTRAVENOUS

## 2012-04-08 MED ORDER — ONDANSETRON HCL 4 MG/2ML IJ SOLN
4.0000 mg | Freq: Four times a day (QID) | INTRAMUSCULAR | Status: DC | PRN
  Administered 2012-04-08: 4 mg via INTRAVENOUS
  Filled 2012-04-08: qty 2

## 2012-04-08 MED ORDER — LABETALOL HCL 5 MG/ML IV SOLN
INTRAVENOUS | Status: DC | PRN
  Administered 2012-04-08: 2.5 mg via INTRAVENOUS

## 2012-04-08 MED ORDER — LACTATED RINGERS IV SOLN: INTRAVENOUS | Status: DC

## 2012-04-08 MED ORDER — ROCURONIUM BROMIDE 100 MG/10ML IV SOLN
INTRAVENOUS | Status: DC | PRN
  Administered 2012-04-08: 40 mg via INTRAVENOUS
  Administered 2012-04-08: 10 mg via INTRAVENOUS

## 2012-04-08 SURGICAL SUPPLY — 68 items
ADH SKN CLS APL DERMABOND .7 (GAUZE/BANDAGES/DRESSINGS) ×4
APPLICATOR COTTON TIP 6IN STRL (MISCELLANEOUS) ×3 IMPLANT
BAG SPEC RTRVL LRG 6X4 10 (ENDOMECHANICALS) ×2
BLADE EXTENDED COATED 6.5IN (ELECTRODE) IMPLANT
BLADE HEX COATED 2.75 (ELECTRODE) ×3 IMPLANT
CATH FOLEY 2WAY SLVR  5CC 18FR (CATHETERS)
CATH FOLEY 2WAY SLVR  5CC 22FR (CATHETERS)
CATH FOLEY 2WAY SLVR 30CC 20FR (CATHETERS) IMPLANT
CATH FOLEY 2WAY SLVR 30CC 22FR (CATHETERS) IMPLANT
CATH FOLEY 2WAY SLVR 5CC 18FR (CATHETERS) IMPLANT
CATH FOLEY 2WAY SLVR 5CC 22FR (CATHETERS) IMPLANT
CLIP TI LARGE 6 (CLIP) IMPLANT
CLOTH BEACON ORANGE TIMEOUT ST (SAFETY) ×3 IMPLANT
CORD HIGH FREQUENCY UNIPOLAR (ELECTROSURGICAL) ×3 IMPLANT
COVER MAYO STAND STRL (DRAPES) IMPLANT
DECANTER SPIKE VIAL GLASS SM (MISCELLANEOUS) ×6 IMPLANT
DERMABOND ADVANCED (GAUZE/BANDAGES/DRESSINGS) ×2
DERMABOND ADVANCED .7 DNX12 (GAUZE/BANDAGES/DRESSINGS) ×4 IMPLANT
DRAPE LAPAROSCOPIC ABDOMINAL (DRAPES) ×3 IMPLANT
GLOVE BIOGEL M 8.0 STRL (GLOVE) ×6 IMPLANT
GLOVE BIOGEL PI IND STRL 7.0 (GLOVE) IMPLANT
GLOVE BIOGEL PI IND STRL 7.5 (GLOVE) ×4 IMPLANT
GLOVE BIOGEL PI INDICATOR 7.0 (GLOVE)
GLOVE BIOGEL PI INDICATOR 7.5 (GLOVE) ×2
GLOVE SURG SIGNA 7.5 PF LTX (GLOVE) ×6 IMPLANT
GLOVE SURG SS PI 7.0 STRL IVOR (GLOVE) ×6 IMPLANT
GLOVE SURG SS PI 7.5 STRL IVOR (GLOVE) ×3 IMPLANT
GOWN STRL NON-REIN LRG LVL3 (GOWN DISPOSABLE) IMPLANT
GOWN STRL REIN XL XLG (GOWN DISPOSABLE) ×15 IMPLANT
HANDLE STAPLE EGIA 4 XL (STAPLE) ×3 IMPLANT
KIT BASIN OR (CUSTOM PROCEDURE TRAY) ×3 IMPLANT
PACK GENERAL/GYN (CUSTOM PROCEDURE TRAY) IMPLANT
PACK UNIVERSAL I (CUSTOM PROCEDURE TRAY) ×3 IMPLANT
POUCH SPECIMEN RETRIEVAL 10MM (ENDOMECHANICALS) ×3 IMPLANT
RELOAD EGIA 60 MED/THCK PURPLE (STAPLE) ×6 IMPLANT
SHEARS CURVED HARMONIC AC 45CM (MISCELLANEOUS) ×3 IMPLANT
SLEEVE ADV FIXATION 5X100MM (TROCAR) ×9 IMPLANT
SPONGE LAP 18X18 X RAY DECT (DISPOSABLE) ×3 IMPLANT
STAPLER VISISTAT 35W (STAPLE) ×3 IMPLANT
SUCTION POOLE TIP (SUCTIONS) IMPLANT
SUT NOV 1 T60/GS (SUTURE) IMPLANT
SUT NOVA T20/GS 25 (SUTURE) IMPLANT
SUT PDS AB 1 CTX 36 (SUTURE) IMPLANT
SUT SILK 2 0 (SUTURE)
SUT SILK 2 0 SH CR/8 (SUTURE) IMPLANT
SUT SILK 2-0 18XBRD TIE 12 (SUTURE) IMPLANT
SUT SILK 3 0 (SUTURE)
SUT SILK 3 0 SH CR/8 (SUTURE) IMPLANT
SUT SILK 3-0 18XBRD TIE 12 (SUTURE) IMPLANT
SUT VIC AB 2-0 SH 18 (SUTURE) IMPLANT
SUT VIC AB 2-0 SH 27 (SUTURE) ×2
SUT VIC AB 2-0 SH 27X BRD (SUTURE) ×2 IMPLANT
SUT VIC AB 3-0 SH 18 (SUTURE) IMPLANT
SUT VIC AB 4-0 SH 18 (SUTURE) ×3 IMPLANT
SUT VICRYL 2 0 18  UND BR (SUTURE)
SUT VICRYL 2 0 18 UND BR (SUTURE) IMPLANT
SUT VICRYL 3 0 BR 18  UND (SUTURE)
SUT VICRYL 3 0 BR 18 UND (SUTURE) IMPLANT
TOWEL OR 17X26 10 PK STRL BLUE (TOWEL DISPOSABLE) ×6 IMPLANT
TOWEL OR NON WOVEN STRL DISP B (DISPOSABLE) ×3 IMPLANT
TRAY FOLEY CATH 14FRSI W/METER (CATHETERS) ×3 IMPLANT
TROCAR 12M 150ML BLUNT (TROCAR) ×3 IMPLANT
TROCAR ADV FIXATION 11X100MM (TROCAR) ×3 IMPLANT
TROCAR ADV FIXATION 5X100MM (TROCAR) ×3 IMPLANT
TROCAR BLADELESS OPT 5 100 (ENDOMECHANICALS) ×3 IMPLANT
TROCAR BLADELESS OPT 5 150 (ENDOMECHANICALS) ×3 IMPLANT
TUBING ENDO SMARTCAP (MISCELLANEOUS) ×3 IMPLANT
TUBING FILTER THERMOFLATOR (ELECTROSURGICAL) ×3 IMPLANT

## 2012-04-08 NOTE — H&P (View-Only) (Signed)
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 female had attempted lap band which time we found a mass at her GE junction. This was sampled by Dr. 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 We are point to try to room removed that and possibly to a lap band or a gastric bypass. She also realized we may have to two-stage this and do the Gist tumor first for doing a lap band or bypass. We'll proceed with surgery on December 6.  Past Medical History  Diagnosis Date  . Anemia     iron def  . Hypertension   . Morbid obesity   . GERD (gastroesophageal reflux disease)   . Anxiety   . Depression   . Migraine   . Back pain   . Chronic knee pain   . Arthritis     left knee   . Hx of laparoscopic gastric banding     Past Surgical History  Procedure Date  . Abdominal hysterectomy   . Knee arthroscopy   . Cesarean section 05/1991  . Breath tek h pylori 07/28/2011    Procedure: BREATH TEK H PYLORI;  Surgeon: Valarie Merino, MD;  Location: [facility];  Service: General;  Laterality: N/A;  to be done at 745  . Esophageal biopsy 02/16/2012    Procedure: BIOPSY;  Surgeon: Valarie Merino, MD;  Location: [facility];  Service: General;;  biopsy of mass x 2  . Eus 03/03/2012    Procedure: UPPER ENDOSCOPIC ULTRASOUND (EUS) LINEAR;  Surgeon: Rachael Fee, MD;  Location: [facility];  Service: Endoscopy;  Laterality: N/A;  . Laparoscopic gastric banding     Current Outpatient Prescriptions  Medication Sig Dispense Refill  . ALPRAZolam (XANAX) 1 MG tablet Take 1 mg by mouth 2 (two) times daily as needed.      . divalproex (DEPAKOTE) 500 MG DR tablet Take 125 mg by mouth 2 (two) times daily. Sprinkle kind of medication - patient takes 8 packets every nite and then alternating 4 packets and 8 packets every other day in the am ( patient states there are 125 mg in each packet )      . fluticasone (FLONASE) 50 MCG/ACT  nasal spray Place 2 sprays into the nose daily.      . hydrochlorothiazide (HYDRODIURIL) 25 MG tablet Take 25 mg by mouth every morning.      Marland Kitchen HYDROcodone-acetaminophen (LORTAB) 7.5-500 MG per tablet Take 1 tablet by mouth every 6 (six) hours as needed. Pain  30 tablet  2  . lamoTRIgine (LAMICTAL) 100 MG tablet Take 100 mg by mouth at bedtime.      Marland Kitchen lisinopril (PRINIVIL,ZESTRIL) 20 MG tablet Take 20 mg by mouth at bedtime.      Marland Kitchen loratadine (CLARITIN) 10 MG tablet Take 10 mg by mouth daily.      Marland Kitchen nystatin cream (MYCOSTATIN) Apply 1 application topically 2 (two) times daily. Applies under breasts      . omeprazole (PRILOSEC) 40 MG capsule Take 40 mg by mouth daily.      Marland Kitchen oxyCODONE (OXY IR/ROXICODONE) 5 MG immediate release tablet Take 1 tablet (5 mg total) by mouth every 4 (four) hours as needed.  30 tablet  0  . promethazine (PHENERGAN) 25 MG tablet Take 25 mg by mouth every 6 (six) hours as needed. for nausea/vomiting      . tiZANidine (ZANAFLEX) 4  MG tablet       . triamcinolone ointment (KENALOG) 0.1 % Apply 1 application topically 2 (two) times daily. Applies to left arm       Codeine Family History  Problem Relation Age of Onset  . Diabetes Mother   . Cancer Father     brain tumor, colon  . Diabetes Father   . Heart disease Father   . Heart disease Paternal Uncle   . Diabetes Paternal Uncle   . Diabetes Maternal Grandmother   . Heart disease Paternal Grandmother   . Heart disease Paternal Grandfather   . Diabetes Maternal Aunt   . Diabetes Maternal Uncle   . Diabetes Paternal Aunt   . Diabetes Maternal Grandfather    Social History:   reports that she has never smoked. She has never used smokeless tobacco. She reports that she drinks alcohol. She reports that she does not use illicit drugs.   REVIEW OF SYSTEMS - PERTINENT POSITIVES ONLY: noncontributory  Physical Exam:   Blood pressure 140/86, pulse 84, temperature 97 F (36.1 C), temperature source Temporal, resp.  rate 20, height 5\' 7"  (1.702 m), weight 386 lb 12.8 oz (175.451 kg). Body mass index is 60.58 kg/(m^2).  Gen:  WDWN AAF NAD  Neurological: Alert and oriented to person, place, and time. Motor and sensory function is grossly intact  Head: Normocephalic and atraumatic.  Eyes: Conjunctivae are normal. Pupils are equal, round, and reactive to light. No scleral icterus.  Neck: Normal range of motion. Neck supple. No tracheal deviation or thyromegaly present.  Cardiovascular:  SR without murmurs or gallops.  No carotid bruits Respiratory: Effort normal.  No respiratory distress. No chest wall tenderness. Breath sounds normal.  No wheezes, rales or rhonchi.  Abdomen:  Obese with healing trocar sites GU: Musculoskeletal: Normal range of motion. Extremities are nontender. No cyanosis, edema or clubbing noted Lymphadenopathy: No cervical, preauricular, postauricular or axillary adenopathy is present Skin: Skin is warm and dry. No rash noted. No diaphoresis. No erythema. No pallor. Pscyh: Normal mood and affect. Behavior is normal. Judgment and thought content normal.   LABORATORY RESULTS: No results found for this or any previous visit (from the past 48 hour(s)).  RADIOLOGY RESULTS: No results found.  Problem List: Patient Active Problem List  Diagnosis  . Morbid obesity-BMI 62  . DEPRESSION, MAJOR, RECURRENT  . MIGRAINE, CHRONIC  . HYPERTENSION, BENIGN SYSTEMIC  . RHINITIS, ALLERGIC  . GASTROESOPHAGEAL REFLUX, NO ESOPHAGITIS  . OSTEOARTHRITIS, KNEES, BILATERAL  . Iron deficiency anemia  . Anxiety  . Back pain  . Eczematous dermatitis  . Arm paresthesia, left  . Nocturia  . Nephrolithiasis  . Need for prophylactic vaccination and inoculation against influenza  . Skin yeast infection  . Peripheral edema  . Encounter for annual health examination  . Bipolar disorder  . 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    Assessment &  Plan: Will resect GIST and try to single stage bariatric procedure but may need a two stage.     [name], MD, [facility] [phone] beeper [phone]  03/25/2012 4:10 PM

## 2012-04-08 NOTE — Interval H&P Note (Signed)
History and Physical Interval Note:  04/08/2012 10:53 AM  Crystal Garza  has presented today for surgery, with the diagnosis of stomach tumor  The various methods of treatment have been discussed with the patient and family. After consideration of risks, benefits and other options for treatment, the patient has consented to  Procedure(s) (LRB) with comments: GASTRECTOMY (N/A) - Removal of GIST Tumor of Stomach  as a surgical intervention .  The patient's history has been reviewed, patient examined, no change in status, stable for surgery.  I have reviewed the patient's chart and labs.  Questions were answered to the patient's satisfaction.     Juddson Cobern B

## 2012-04-08 NOTE — Preoperative (Signed)
Beta Blockers   Reason not to administer Beta Blockers:Not Applicable 

## 2012-04-08 NOTE — Op Note (Signed)
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  Abdomen was prepped with PCMX and draped sterilely.  Access achieved through the left upper quadrant with 5 mm Optiview.  7 trocar sites were used to retract the liver and explore the upper abdomen.  The GIST was seen anteriorally where we had seen it before and since it has been biopsied we were able to place a suture through it.  Holding it up,  I placed a 6 cm Covidien purple load beneath the tumor.  Dr. Ezzard Standing endoscoped the patient to confirm the stapler was not impinging on the lumen.    Two firings of the stapler removed the tumor and it was sent for frozen suggesting a GIST.  No bleeding was noted.  The port sites were injected with Exparel and were removed.    [name], MD, [facility], [place] [phone]

## 2012-04-08 NOTE — Transfer of Care (Signed)
Immediate Anesthesia Transfer of Care Note  Patient: Crystal Garza  Procedure(s) Performed: Procedure(s) (LRB) with comments: LAPAROSCOPIC GASTRECTOMY () - removal of GIST tumor of stomach  Patient Location: PACU  Anesthesia Type:General  Level of Consciousness: awake, alert , oriented and patient cooperative  Airway & Oxygen Therapy: Patient Spontanous Breathing and Patient connected to face mask oxygen  Post-op Assessment: Report given to PACU RN, Post -op Vital signs reviewed and stable and Patient moving all extremities  Post vital signs: Reviewed and stable  Complications: No apparent anesthesia complications

## 2012-04-08 NOTE — Anesthesia Postprocedure Evaluation (Signed)
  Anesthesia Post-op Note  Patient: Crystal Garza  Procedure(s) Performed: Procedure(s) (LRB): LAPAROSCOPIC GASTRECTOMY ()  Patient Location: PACU  Anesthesia Type: General  Level of Consciousness: awake and alert   Airway and Oxygen Therapy: Patient Spontanous Breathing  Post-op Pain: mild  Post-op Assessment: Post-op Vital signs reviewed, Patient's Cardiovascular Status Stable, Respiratory Function Stable, Patent Airway and No signs of Nausea or vomiting  Last Vitals:  Filed Vitals:   04/08/12 1400  BP: 137/71  Pulse: 87  Temp:   Resp: 14    Post-op Vital Signs: stable   Complications: No apparent anesthesia complications

## 2012-04-08 NOTE — Anesthesia Preprocedure Evaluation (Addendum)
Anesthesia Evaluation  Patient identified by MRN, date of birth, ID band Patient awake    Reviewed: Allergy & Precautions, H&P , NPO status , Patient's Chart, lab work & pertinent test results  Airway Mallampati: II TM Distance: >3 FB Neck ROM: full    Dental  (+) Dental Advisory Given and Edentulous Upper   Pulmonary neg pulmonary ROS,  breath sounds clear to auscultation  Pulmonary exam normal       Cardiovascular hypertension, Pt. on medications Rhythm:regular Rate:Normal     Neuro/Psych  Headaches, Anxiety Depression Bipolar Disorder negative neurological ROS     GI/Hepatic negative GI ROS, Neg liver ROS, GERD-  Medicated and Controlled,  Endo/Other  negative endocrine ROSMorbid obesity  Renal/GU negative Renal ROS  negative genitourinary   Musculoskeletal   Abdominal (+) + obese,   Peds  Hematology negative hematology ROS (+) anemia ,   Anesthesia Other Findings   Reproductive/Obstetrics negative OB ROS                          Anesthesia Physical Anesthesia Plan  ASA: III  Anesthesia Plan: General   Post-op Pain Management:    Induction: Intravenous  Airway Management Planned: Oral ETT  Additional Equipment:   Intra-op Plan:   Post-operative Plan: Extubation in OR  Informed Consent: I have reviewed the patients History and Physical, chart, labs and discussed the procedure including the risks, benefits and alternatives for the proposed anesthesia with the patient or authorized representative who has indicated his/her understanding and acceptance.   Dental Advisory Given  Plan Discussed with: CRNA and Surgeon  Anesthesia Plan Comments:         Anesthesia Quick Evaluation

## 2012-04-08 NOTE — Progress Notes (Signed)
Patient c/o breakthrough nausea after ambulating in hallway. Dr. Michaell Cowing return page. T.O.V. For pheneragan & reglan IV prn breakthrough nausea. T.O.V. also for benadryl IV prn itching or insomnia.

## 2012-04-09 LAB — BASIC METABOLIC PANEL
BUN: 4 mg/dL — ABNORMAL LOW (ref 6–23)
CO2: 27 mEq/L (ref 19–32)
Calcium: 9 mg/dL (ref 8.4–10.5)
Chloride: 102 mEq/L (ref 96–112)
Creatinine, Ser: 0.65 mg/dL (ref 0.50–1.10)
GFR calc Af Amer: >90 mL/min (ref >90)
GFR calc non Af Amer: >90 mL/min (ref >90)
Glucose, Bld: 100 mg/dL — ABNORMAL HIGH (ref 70–99)
Potassium: 3.2 mEq/L — ABNORMAL LOW (ref 3.5–5.1)
Sodium: 138 mEq/L (ref 135–145)

## 2012-04-09 LAB — CBC
HCT: 34.3 % — ABNORMAL LOW (ref 36.0–46.0)
Hemoglobin: 10.7 g/dL — ABNORMAL LOW (ref 12.0–15.0)
MCH: 24.8 pg — ABNORMAL LOW (ref 26.0–34.0)
MCHC: 31.2 g/dL (ref 30.0–36.0)
MCV: 79.4 fL (ref 78.0–100.0)
Platelets: 462 10*3/uL — ABNORMAL HIGH (ref 150–400)
RBC: 4.32 MIL/uL (ref 3.87–5.11)
RDW: 15.7 % — ABNORMAL HIGH (ref 11.5–15.5)
WBC: 13.7 10*3/uL — ABNORMAL HIGH (ref 4.0–10.5)

## 2012-04-09 MED ORDER — DIVALPROEX SODIUM 125 MG PO CPSP
1000.0000 mg | ORAL_CAPSULE | ORAL | Status: DC
  Administered 2012-04-10: 125 mg via ORAL
  Filled 2012-04-09: qty 8

## 2012-04-09 MED ORDER — SODIUM CHLORIDE 0.9 % IV SOLN: 250.0000 mL | INTRAVENOUS | Status: DC | PRN

## 2012-04-09 MED ORDER — LORATADINE 10 MG PO TABS
10.0000 mg | ORAL_TABLET | Freq: Every day | ORAL | Status: DC
  Administered 2012-04-09 – 2012-04-10 (×2): 10 mg via ORAL
  Filled 2012-04-09 (×2): qty 1

## 2012-04-09 MED ORDER — TRIAMCINOLONE ACETONIDE 0.1 % EX OINT
1.0000 "application " | TOPICAL_OINTMENT | Freq: Two times a day (BID) | CUTANEOUS | Status: DC
  Administered 2012-04-09: 1 via TOPICAL
  Filled 2012-04-09: qty 15

## 2012-04-09 MED ORDER — DIVALPROEX SODIUM 125 MG PO CPSP
1000.0000 mg | ORAL_CAPSULE | Freq: Every day | ORAL | Status: DC
  Administered 2012-04-09: 1000 mg via ORAL
  Filled 2012-04-09 (×2): qty 8

## 2012-04-09 MED ORDER — OXYCODONE HCL 5 MG PO TABS
5.0000 mg | ORAL_TABLET | ORAL | Status: DC | PRN
  Administered 2012-04-09 – 2012-04-10 (×3): 10 mg via ORAL
  Filled 2012-04-09 (×3): qty 2

## 2012-04-09 MED ORDER — DIVALPROEX SODIUM 125 MG PO CPSP
500.0000 mg | ORAL_CAPSULE | ORAL | Status: DC
  Administered 2012-04-09: 500 mg via ORAL

## 2012-04-09 MED ORDER — MAGIC MOUTHWASH
15.0000 mL | Freq: Four times a day (QID) | ORAL | Status: DC | PRN
  Filled 2012-04-09: qty 15

## 2012-04-09 MED ORDER — LAMOTRIGINE 100 MG PO TABS
100.0000 mg | ORAL_TABLET | Freq: Every day | ORAL | Status: DC
  Administered 2012-04-09: 100 mg via ORAL
  Filled 2012-04-09 (×2): qty 1

## 2012-04-09 MED ORDER — ACETAMINOPHEN 500 MG PO TABS
1000.0000 mg | ORAL_TABLET | Freq: Three times a day (TID) | ORAL | Status: DC
  Administered 2012-04-09 – 2012-04-10 (×3): 1000 mg via ORAL
  Filled 2012-04-09 (×6): qty 2

## 2012-04-09 MED ORDER — NYSTATIN 100000 UNIT/GM EX CREA
1.0000 "application " | TOPICAL_CREAM | Freq: Two times a day (BID) | CUTANEOUS | Status: DC
  Administered 2012-04-09: 1 via TOPICAL
  Filled 2012-04-09: qty 15

## 2012-04-09 MED ORDER — ALPRAZOLAM 1 MG PO TABS
1.0000 mg | ORAL_TABLET | Freq: Two times a day (BID) | ORAL | Status: DC | PRN
  Administered 2012-04-09: 1 mg via ORAL
  Filled 2012-04-09: qty 1

## 2012-04-09 MED ORDER — ALUM & MAG HYDROXIDE-SIMETH 200-200-20 MG/5ML PO SUSP: 30.0000 mL | Freq: Four times a day (QID) | ORAL | Status: DC | PRN

## 2012-04-09 MED ORDER — LACTATED RINGERS IV BOLUS (SEPSIS): 1000.0000 mL | Freq: Three times a day (TID) | INTRAVENOUS | Status: DC | PRN

## 2012-04-09 MED ORDER — PANTOPRAZOLE SODIUM 40 MG PO TBEC
80.0000 mg | DELAYED_RELEASE_TABLET | Freq: Every day | ORAL | Status: DC
  Administered 2012-04-09 – 2012-04-10 (×2): 80 mg via ORAL
  Filled 2012-04-09 (×2): qty 2

## 2012-04-09 MED ORDER — SODIUM CHLORIDE 0.9 % IJ SOLN
3.0000 mL | Freq: Two times a day (BID) | INTRAMUSCULAR | Status: DC
  Administered 2012-04-09 (×2): 3 mL via INTRAVENOUS

## 2012-04-09 MED ORDER — SODIUM CHLORIDE 0.9 % IJ SOLN: 3.0000 mL | INTRAMUSCULAR | Status: DC | PRN

## 2012-04-09 MED ORDER — HYDROMORPHONE HCL PF 1 MG/ML IJ SOLN: 0.5000 mg | INTRAMUSCULAR | Status: DC | PRN

## 2012-04-09 MED ORDER — DIVALPROEX SODIUM 125 MG PO CPSP
500.0000 mg | ORAL_CAPSULE | Freq: Once | ORAL | Status: DC
  Filled 2012-04-09: qty 4

## 2012-04-09 MED ORDER — FLUTICASONE PROPIONATE 50 MCG/ACT NA SUSP
2.0000 | Freq: Every day | NASAL | Status: DC
  Filled 2012-04-09: qty 16

## 2012-04-09 MED ORDER — LIP MEDEX EX OINT
1.0000 "application " | TOPICAL_OINTMENT | Freq: Two times a day (BID) | CUTANEOUS | Status: DC
  Administered 2012-04-09: 1 via TOPICAL

## 2012-04-09 MED ORDER — TIZANIDINE HCL 4 MG PO TABS
4.0000 mg | ORAL_TABLET | Freq: Four times a day (QID) | ORAL | Status: DC | PRN
  Filled 2012-04-09: qty 1

## 2012-04-09 MED ORDER — HYDROCHLOROTHIAZIDE 25 MG PO TABS
25.0000 mg | ORAL_TABLET | Freq: Every morning | ORAL | Status: DC
  Administered 2012-04-09 – 2012-04-10 (×2): 25 mg via ORAL
  Filled 2012-04-09 (×2): qty 1

## 2012-04-09 MED ORDER — LISINOPRIL 20 MG PO TABS
20.0000 mg | ORAL_TABLET | Freq: Every day | ORAL | Status: DC
  Administered 2012-04-09: 20 mg via ORAL
  Filled 2012-04-09 (×2): qty 1

## 2012-04-09 MED ORDER — BISACODYL 10 MG RE SUPP: 10.0000 mg | Freq: Two times a day (BID) | RECTAL | Status: DC | PRN

## 2012-04-09 NOTE — Progress Notes (Addendum)
[name] [id number] 01/24/1971   Subjective:  Mildly sore No nausea Wanting to try orange sherbert  Objective:  Vital signs:  Filed Vitals:   04/08/12 1802 04/08/12 2058 04/09/12 0144 04/09/12 0558  BP: 125/71 153/89 123/71 150/81  Pulse: 98 85 92 91  Temp: 98.1 F (36.7 C) 97.8 F (36.6 C) 98.4 F (36.9 C) 98.7 F (37.1 C)  TempSrc: Oral Oral Oral Oral  Resp: 24 20 18 18   Height:      Weight:      SpO2: 97% 100% 97% 100%    Last BM Date: 04/07/12  Intake/Output   Yesterday:  12/06 0701 - 12/07 0700 In: 3700 [I.V.:3450; IV Piggyback:250] Out: 3400 [Urine:3300; Emesis/NG output:75; Blood:25] This shift:     Bowel function:  Flatus: n  BM: n  Physical Exam:  General: Pt awake/alert/oriented x4 in no acute distress Eyes: PERRL, normal EOM.  Sclera clear.  No icterus Neuro: CN II-XII intact w/o focal sensory/motor deficits. Lymph: No head/neck/groin lymphadenopathy Psych:  No delerium/psychosis/paranoia HENT: Normocephalic, Mucus membranes moist.  No thrush Neck: Supple, No tracheal deviation Chest: No chest wall pain w good excursion CV:  Pulses intact.  Regular rhythm MS: Normal AROM mjr joints.  No obvious deformity Abdomen: Soft.  Nondistended.  Mildly tender at incisions only.  No incarcerated hernias. Ext:  SCDs BLE.  No mjr edema.  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   Assessment  Crystal Garza  41 y.o. female  1 Day Post-Op  Procedure(s): LAPAROSCOPIC WEDGE RESECTION/ PARTIAL GASTRECTOMY OF GIST TUMOR  Stable  Plan:  -start liquids -wean IVF -anxiolysis -Tx rashs w Nystatin/Kenalog -HTN control -VTE prophylaxis- SCDs, etc -mobilize as tolerated to help recovery  Ardeth Sportsman, M.D., F.A.C.S. Gastrointestinal and Minimally Invasive Surgery [facility] [address] [place], [place] [address] [phone] Main / Paging [phone] Voice Mail   04/09/2012  CARE TEAM:  PCP: [name], MD  Outpatient Care Team: Patient Care Team: Hilarie Fredrickson, MD as PCP - General (Family Medicine) Linna Darner, RD as Dietitian (Family Medicine) Orvil Feil Himmelrich, MS, RD as Dietitian [facility])  Inpatient Treatment Team: Treatment Team: Attending Provider: Valarie Merino, MD; Registered Nurse: Bethann Goo, RN; Technician: Gigi Gin, NT; Technician: Harrie Jeans Debbink, NT   Results:   Labs: Results for orders placed during the hospital encounter of 04/08/12 (from the past 48 hour(s))  CBC     Status: Abnormal   Collection Time   04/08/12  3:46 PM      Component Value Range Comment   WBC 17.8 (*) 4.0 - 10.5 K/uL    RBC 4.52  3.87 - 5.11 MIL/uL    Hemoglobin 11.3 (*) 12.0 - 15.0 g/dL    HCT 95.2  84.1 - 32.4 %    MCV 79.6  78.0 - 100.0 fL    MCH 25.0 (*) 26.0 - 34.0 pg    MCHC 31.4  30.0 - 36.0 g/dL    RDW 40.1 (*) 02.7 - 15.5 %    Platelets 504 (*) 150 - 400 K/uL   CREATININE, SERUM     Status: Normal   Collection Time   04/08/12  3:46 PM      Component Value Range Comment   Creatinine, Ser 0.78  0.50 - 1.10 mg/dL  GFR calc non Af Amer >90  >90 mL/min    GFR calc Af Amer >90  >90 mL/min   CBC     Status: Abnormal   Collection Time   04/09/12  4:53 AM      Component Value Range Comment   WBC 13.7 (*) 4.0 - 10.5 K/uL    RBC 4.32  3.87 - 5.11 MIL/uL    Hemoglobin 10.7 (*) 12.0 - 15.0 g/dL    HCT 16.1 (*) 09.6 - 46.0 %    MCV 79.4  78.0 - 100.0 fL    MCH 24.8 (*) 26.0 - 34.0 pg    MCHC 31.2  30.0 - 36.0 g/dL    RDW 04.5 (*) 40.9 - 15.5 %    Platelets 462 (*) 150 - 400 K/uL   BASIC METABOLIC PANEL     Status: Abnormal   Collection Time   04/09/12  4:53 AM      Component Value Range Comment   Sodium 138  135 - 145 mEq/L    Potassium 3.2 (*) 3.5 - 5.1 mEq/L    Chloride 102  96 - 112  mEq/L    CO2 27  19 - 32 mEq/L    Glucose, Bld 100 (*) 70 - 99 mg/dL    BUN 4 (*) 6 - 23 mg/dL    Creatinine, Ser 8.11  0.50 - 1.10 mg/dL    Calcium 9.0  8.4 - 91.4 mg/dL    GFR calc non Af Amer >90  >90 mL/min    GFR calc Af Amer >90  >90 mL/min     Imaging / Studies: No results found.  Medications / Allergies: per chart  Antibiotics: Anti-infectives     Start     Dose/Rate Route Frequency Ordered Stop   04/08/12 0821   cefOXitin (MEFOXIN) 2 g in dextrose 5 % 50 mL IVPB        2 g 100 mL/hr over 30 Minutes Intravenous On call to O.R. 04/08/12 7829 04/08/12 1110

## 2012-04-10 LAB — BASIC METABOLIC PANEL
BUN: 3 mg/dL — ABNORMAL LOW (ref 6–23)
CO2: 30 mEq/L (ref 19–32)
Calcium: 9.4 mg/dL (ref 8.4–10.5)
Chloride: 98 mEq/L (ref 96–112)
Creatinine, Ser: 0.76 mg/dL (ref 0.50–1.10)
GFR calc Af Amer: >90 mL/min (ref >90)
GFR calc non Af Amer: >90 mL/min (ref >90)
Glucose, Bld: 77 mg/dL (ref 70–99)
Potassium: 3.1 mEq/L — ABNORMAL LOW (ref 3.5–5.1)
Sodium: 138 mEq/L (ref 135–145)

## 2012-04-10 LAB — CBC
HCT: 34.4 % — ABNORMAL LOW (ref 36.0–46.0)
Hemoglobin: 10.7 g/dL — ABNORMAL LOW (ref 12.0–15.0)
MCH: 25.1 pg — ABNORMAL LOW (ref 26.0–34.0)
MCHC: 31.1 g/dL (ref 30.0–36.0)
MCV: 80.6 fL (ref 78.0–100.0)
Platelets: 472 10*3/uL — ABNORMAL HIGH (ref 150–400)
RBC: 4.27 MIL/uL (ref 3.87–5.11)
RDW: 16 % — ABNORMAL HIGH (ref 11.5–15.5)
WBC: 11.7 10*3/uL — ABNORMAL HIGH (ref 4.0–10.5)

## 2012-04-10 MED ORDER — PROMETHAZINE HCL 25 MG PO TABS: 25.0000 mg | ORAL_TABLET | Freq: Four times a day (QID) | ORAL | Status: DC | PRN

## 2012-04-10 MED ORDER — OXYCODONE HCL 5 MG PO TABS: 5.0000 mg | ORAL_TABLET | ORAL | Status: DC | PRN

## 2012-04-10 NOTE — Progress Notes (Signed)
Crystal Garza [id number] Jun 23, 1970   Subjective:  Less sore.  Pain controlled No nausea Tolerated fulls well Walking well in hallways Wants to go home  Objective:  Vital signs:  Filed Vitals:   04/09/12 1000 04/09/12 1400 04/09/12 2130 04/10/12 0512  BP: 115/75 122/81 136/81 123/71  Pulse: 95 104 88 86  Temp: 98 F (36.7 C) 98.3 F (36.8 C) 97.7 F (36.5 C) 97.8 F (36.6 C)  TempSrc: Oral Oral Oral Oral  Resp: 18 18 18 18   Height:      Weight:      SpO2: 99% 99% 98% 100%    Last BM Date: 04/07/12  Intake/Output   Yesterday:  12/07 0701 - 12/08 0700 In: 1360 [P.O.:560; I.V.:800] Out: 1550 [Urine:1550] This shift:  Total I/O In: 120 [P.O.:120] Out: 300 [Urine:300]  Bowel function:  Flatus: Yes  BM: n  Physical Exam:  General: Pt awake/alert/oriented x4 in no acute distress Eyes: PERRL, normal EOM.  Sclera clear.  No icterus Neuro: CN II-XII intact w/o focal sensory/motor deficits. Lymph: No head/neck/groin lymphadenopathy Psych:  No delerium/psychosis/paranoia.  Calm, smiling HENT: Normocephalic, Mucus membranes moist.  No thrush Neck: Supple, No tracheal deviation Chest: No chest wall pain w good excursion CV:  Pulses intact.  Regular rhythm MS: Normal AROM mjr joints.  No obvious deformity Abdomen: Soft.  Nondistended.  Mildly tender at incisions only.  No incarcerated hernias. Ext:  SCDs BLE.  No mjr edema.  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   Assessment  Crystal Garza  41 y.o. female  2 Days Post-Op  Procedure(s): LAPAROSCOPIC WEDGE RESECTION/ PARTIAL GASTRECTOMY OF GIST TUMOR  Improving  Plan:  -try solids -anxiolysis -Tx rashs w Nystatin/Kenalog -HTN control -VTE prophylaxis- SCDs, etc -f/u pathology -mobilize as tolerated to help recovery  Suspect she can leave later today if  continues to improve.  I discussed the patient's status to the pt & family (mother).  Questions were answered.  They expressed understanding & appreciation.   Ardeth Sportsman, M.D., F.A.C.S. Gastrointestinal and Minimally Invasive Surgery [facility] [address] [place], [place] [address] [phone] Main / Paging [phone] Voice Mail   04/10/2012  CARE TEAM:  PCP: [name], MD  Outpatient Care Team: Patient Care Team: Hilarie Fredrickson, MD as PCP - General (Family Medicine) Linna Darner, RD as Dietitian (Family Medicine) Orvil Feil Himmelrich, MS, RD as Dietitian [facility])  Inpatient Treatment Team: Treatment Team: Attending Provider: Valarie Merino, MD; Registered Nurse: Bethann Goo, RN; Technician: Gigi Gin, NT; Technician: Harrie Jeans Debbink, NT; Technician: Vella Raring, NT; Respiratory Therapist: Renold Genta, RRT   Results:   Labs: Results for orders placed during the hospital encounter of 04/08/12 (from the past 48 hour(s))  CBC     Status: Abnormal   Collection Time   04/08/12  3:46 PM      Component Value Range Comment   WBC 17.8 (*) 4.0 - 10.5 K/uL    RBC 4.52  3.87 - 5.11 MIL/uL    Hemoglobin 11.3 (*) 12.0 - 15.0 g/dL    HCT 41.3  24.4 - 01.0 %    MCV 79.6  78.0 - 100.0 fL    MCH 25.0 (*) 26.0 - 34.0 pg    MCHC 31.4  30.0 - 36.0 g/dL  RDW 15.8 (*) 11.5 - 15.5 %    Platelets 504 (*) 150 - 400 K/uL   CREATININE, SERUM     Status: Normal   Collection Time   04/08/12  3:46 PM      Component Value Range Comment   Creatinine, Ser 0.78  0.50 - 1.10 mg/dL    GFR calc non Af Amer >90  >90 mL/min    GFR calc Af Amer >90  >90 mL/min   CBC     Status: Abnormal   Collection Time   04/09/12  4:53 AM      Component Value Range Comment   WBC 13.7 (*) 4.0 - 10.5 K/uL    RBC 4.32  3.87 - 5.11 MIL/uL    Hemoglobin 10.7 (*) 12.0 - 15.0 g/dL    HCT 14.7 (*) 82.9 - 46.0 %    MCV  79.4  78.0 - 100.0 fL    MCH 24.8 (*) 26.0 - 34.0 pg    MCHC 31.2  30.0 - 36.0 g/dL    RDW 56.2 (*) 13.0 - 15.5 %    Platelets 462 (*) 150 - 400 K/uL   BASIC METABOLIC PANEL     Status: Abnormal   Collection Time   04/09/12  4:53 AM      Component Value Range Comment   Sodium 138  135 - 145 mEq/L    Potassium 3.2 (*) 3.5 - 5.1 mEq/L    Chloride 102  96 - 112 mEq/L    CO2 27  19 - 32 mEq/L    Glucose, Bld 100 (*) 70 - 99 mg/dL    BUN 4 (*) 6 - 23 mg/dL    Creatinine, Ser 8.65  0.50 - 1.10 mg/dL    Calcium 9.0  8.4 - 78.4 mg/dL    GFR calc non Af Amer >90  >90 mL/min    GFR calc Af Amer >90  >90 mL/min   CBC     Status: Abnormal   Collection Time   04/10/12  5:38 AM      Component Value Range Comment   WBC 11.7 (*) 4.0 - 10.5 K/uL    RBC 4.27  3.87 - 5.11 MIL/uL    Hemoglobin 10.7 (*) 12.0 - 15.0 g/dL    HCT 69.6 (*) 29.5 - 46.0 %    MCV 80.6  78.0 - 100.0 fL    MCH 25.1 (*) 26.0 - 34.0 pg    MCHC 31.1  30.0 - 36.0 g/dL    RDW 28.4 (*) 13.2 - 15.5 %    Platelets 472 (*) 150 - 400 K/uL   BASIC METABOLIC PANEL     Status: Abnormal   Collection Time   04/10/12  5:38 AM      Component Value Range Comment   Sodium 138  135 - 145 mEq/L    Potassium 3.1 (*) 3.5 - 5.1 mEq/L    Chloride 98  96 - 112 mEq/L    CO2 30  19 - 32 mEq/L    Glucose, Bld 77  70 - 99 mg/dL    BUN 3 (*) 6 - 23 mg/dL    Creatinine, Ser 4.40  0.50 - 1.10 mg/dL    Calcium 9.4  8.4 - 10.2 mg/dL    GFR calc non Af Amer >90  >90 mL/min    GFR calc Af Amer >90  >90 mL/min     Imaging / Studies: No results found.  Medications / Allergies: per chart  Antibiotics:  Anti-infectives     Start     Dose/Rate Route Frequency Ordered Stop   04/08/12 0821   cefOXitin (MEFOXIN) 2 g in dextrose 5 % 50 mL IVPB        2 g 100 mL/hr over 30 Minutes Intravenous On call to O.R. 04/08/12 0821 04/08/12 1110              Crystal Garza [id number] 07/08/1970   Subjective:  Mildly sore No nausea Wanting to try  orange sherbert  Objective:  Vital signs:  Filed Vitals:   04/09/12 1000 04/09/12 1400 04/09/12 2130 04/10/12 0512  BP: 115/75 122/81 136/81 123/71  Pulse: 95 104 88 86  Temp: 98 F (36.7 C) 98.3 F (36.8 C) 97.7 F (36.5 C) 97.8 F (36.6 C)  TempSrc: Oral Oral Oral Oral  Resp: 18 18 18 18   Height:      Weight:      SpO2: 99% 99% 98% 100%    Last BM Date: 04/07/12  Intake/Output   Yesterday:  12/07 0701 - 12/08 0700 In: 1360 [P.O.:560; I.V.:800] Out: 1550 [Urine:1550] This shift:  Total I/O In: 120 [P.O.:120] Out: 300 [Urine:300]  Bowel function:  Flatus: n  BM: n  Physical Exam:  General: Pt awake/alert/oriented x4 in no acute distress Eyes: PERRL, normal EOM.  Sclera clear.  No icterus Neuro: CN II-XII intact w/o focal sensory/motor deficits. Lymph: No head/neck/groin lymphadenopathy Psych:  No delerium/psychosis/paranoia HENT: Normocephalic, Mucus membranes moist.  No thrush Neck: Supple, No tracheal deviation Chest: No chest wall pain w good excursion CV:  Pulses intact.  Regular rhythm MS: Normal AROM mjr joints.  No obvious deformity Abdomen: Soft.  Nondistended.  Mildly tender at incisions only.  No incarcerated hernias. Ext:  SCDs BLE.  No mjr edema.  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   Assessment  Crystal Garza  41 y.o. female  2 Days Post-Op  Procedure(s): LAPAROSCOPIC WEDGE RESECTION/ PARTIAL GASTRECTOMY OF GIST TUMOR  Stable  Plan:  -start liquids -wean IVF -anxiolysis -Tx rashs w Nystatin/Kenalog -HTN control -VTE prophylaxis- SCDs, etc -mobilize as tolerated to help recovery  Ardeth Sportsman, M.D., F.A.C.S. Gastrointestinal and Minimally Invasive Surgery [facility] [address] [place], [place] [address] [phone] Main / Paging [phone] Voice Mail   04/10/2012  CARE TEAM:  PCP: [name], MD  Outpatient Care Team: Patient Care Team: Hilarie Fredrickson, MD as PCP - General (Family Medicine) Linna Darner, RD as Dietitian (Family Medicine) Orvil Feil Himmelrich, MS, RD as Dietitian [facility])  Inpatient Treatment Team: Treatment Team: Attending Provider: Valarie Merino, MD; Registered Nurse: Bethann Goo, RN; Technician: Gigi Gin, NT; Technician: Lynden Ang, NT; Technician: Vella Raring, NT; Respiratory Therapist: Renold Genta, RRT   Results:   Labs: Results for orders placed during the hospital encounter of 04/08/12 (from the past 48 hour(s))  CBC     Status: Abnormal   Collection Time   04/08/12  3:46 PM      Component Value Range Comment   WBC 17.8 (*) 4.0 - 10.5 K/uL    RBC 4.52  3.87 - 5.11 MIL/uL    Hemoglobin 11.3 (*) 12.0 - 15.0 g/dL    HCT 36.6  44.0 - 34.7 %    MCV 79.6  78.0 - 100.0 fL  MCH 25.0 (*) 26.0 - 34.0 pg    MCHC 31.4  30.0 - 36.0 g/dL    RDW 57.8 (*) 46.9 - 15.5 %    Platelets 504 (*) 150 - 400 K/uL   CREATININE, SERUM     Status: Normal   Collection Time   04/08/12  3:46 PM      Component Value Range Comment   Creatinine, Ser 0.78  0.50 - 1.10 mg/dL    GFR calc non Af Amer >90  >90 mL/min    GFR calc Af Amer >90  >90 mL/min   CBC     Status: Abnormal   Collection Time   04/09/12  4:53 AM      Component Value Range Comment   WBC 13.7 (*) 4.0 - 10.5 K/uL    RBC 4.32  3.87 - 5.11 MIL/uL    Hemoglobin 10.7 (*) 12.0 - 15.0 g/dL    HCT 62.9 (*) 52.8 - 46.0 %    MCV 79.4  78.0 - 100.0 fL    MCH 24.8 (*) 26.0 - 34.0 pg    MCHC 31.2  30.0 - 36.0 g/dL    RDW 41.3 (*) 24.4 - 15.5 %    Platelets 462 (*) 150 - 400 K/uL   BASIC METABOLIC PANEL     Status: Abnormal   Collection Time   04/09/12  4:53 AM      Component Value Range Comment   Sodium 138  135 - 145 mEq/L    Potassium 3.2 (*) 3.5 - 5.1 mEq/L    Chloride 102  96 - 112  mEq/L    CO2 27  19 - 32 mEq/L    Glucose, Bld 100 (*) 70 - 99 mg/dL    BUN 4 (*) 6 - 23 mg/dL    Creatinine, Ser 0.10  0.50 - 1.10 mg/dL    Calcium 9.0  8.4 - 27.2 mg/dL    GFR calc non Af Amer >90  >90 mL/min    GFR calc Af Amer >90  >90 mL/min   CBC     Status: Abnormal   Collection Time   04/10/12  5:38 AM      Component Value Range Comment   WBC 11.7 (*) 4.0 - 10.5 K/uL    RBC 4.27  3.87 - 5.11 MIL/uL    Hemoglobin 10.7 (*) 12.0 - 15.0 g/dL    HCT 53.6 (*) 64.4 - 46.0 %    MCV 80.6  78.0 - 100.0 fL    MCH 25.1 (*) 26.0 - 34.0 pg    MCHC 31.1  30.0 - 36.0 g/dL    RDW 03.4 (*) 74.2 - 15.5 %    Platelets 472 (*) 150 - 400 K/uL   BASIC METABOLIC PANEL     Status: Abnormal   Collection Time   04/10/12  5:38 AM      Component Value Range Comment   Sodium 138  135 - 145 mEq/L    Potassium 3.1 (*) 3.5 - 5.1 mEq/L    Chloride 98  96 - 112 mEq/L    CO2 30  19 - 32 mEq/L    Glucose, Bld 77  70 - 99 mg/dL    BUN 3 (*) 6 - 23 mg/dL    Creatinine, Ser 5.95  0.50 - 1.10 mg/dL    Calcium 9.4  8.4 - 63.8 mg/dL    GFR calc non Af Amer >90  >90 mL/min    GFR calc Af Amer >90  >  90 mL/min     Imaging / Studies: No results found.  Medications / Allergies: per chart  Antibiotics: Anti-infectives     Start     Dose/Rate Route Frequency Ordered Stop   04/08/12 0821   cefOXitin (MEFOXIN) 2 g in dextrose 5 % 50 mL IVPB        2 g 100 mL/hr over 30 Minutes Intravenous On call to O.R. 04/08/12 1610 04/08/12 1110

## 2012-04-11 ENCOUNTER — Encounter (HOSPITAL_COMMUNITY): Payer: Self-pay | Admitting: Surgery

## 2012-04-13 ENCOUNTER — Telehealth (INDEPENDENT_AMBULATORY_CARE_PROVIDER_SITE_OTHER): Payer: Self-pay | Admitting: General Surgery

## 2012-04-13 ENCOUNTER — Telehealth (INDEPENDENT_AMBULATORY_CARE_PROVIDER_SITE_OTHER): Payer: Self-pay

## 2012-04-13 ENCOUNTER — Other Ambulatory Visit (INDEPENDENT_AMBULATORY_CARE_PROVIDER_SITE_OTHER): Payer: Self-pay | Admitting: Surgery

## 2012-04-13 ENCOUNTER — Ambulatory Visit
Admission: RE | Admit: 2012-04-13 | Discharge: 2012-04-13 | Disposition: A | Discharge status: Home / Self Care | Payer: Medicare Other | Source: Ambulatory Visit | Attending: Surgery | Admitting: Surgery

## 2012-04-13 DIAGNOSIS — R079 Chest pain, unspecified: Secondary | ICD-10-CM

## 2012-04-13 LAB — CBC WITH DIFFERENTIAL/PLATELET
Basophils Absolute: 0.1 10*3/uL (ref 0.0–0.1)
Basophils Relative: 0 % (ref 0–1)
Eosinophils Absolute: 0.1 10*3/uL (ref 0.0–0.7)
Eosinophils Relative: 1 % (ref 0–5)
HCT: 34 % — ABNORMAL LOW (ref 36.0–46.0)
Hemoglobin: 10.8 g/dL — ABNORMAL LOW (ref 12.0–15.0)
Lymphocytes Relative: 26 % (ref 12–46)
Lymphs Abs: 4 10*3/uL (ref 0.7–4.0)
MCH: 24.7 pg — ABNORMAL LOW (ref 26.0–34.0)
MCHC: 31.8 g/dL (ref 30.0–36.0)
MCV: 77.6 fL — ABNORMAL LOW (ref 78.0–100.0)
Monocytes Absolute: 1 10*3/uL (ref 0.1–1.0)
Monocytes Relative: 7 % (ref 3–12)
Neutro Abs: 10 10*3/uL — ABNORMAL HIGH (ref 1.7–7.7)
Neutrophils Relative %: 66 % (ref 43–77)
Platelets: 507 10*3/uL — ABNORMAL HIGH (ref 150–400)
RBC: 4.38 MIL/uL (ref 3.87–5.11)
RDW: 16.7 % — ABNORMAL HIGH (ref 11.5–15.5)
WBC: 15.2 10*3/uL — ABNORMAL HIGH (ref 4.0–10.5)

## 2012-04-13 NOTE — Telephone Encounter (Signed)
Pt called c/o chest pains that began when she returned home after surgery.  She is also complaining of nausea after eating.  Please advise.

## 2012-04-13 NOTE — Telephone Encounter (Signed)
I tried to call her back at [phone]. There was no answer I left a message to call back if she still had questions.

## 2012-04-13 NOTE — Discharge Summary (Signed)
Physician Discharge Summary  Patient ID: Crystal Garza MRN: [id number] DOB/AGE: 11-May-1970 41 y.o.  Admit date: 04/08/2012 Discharge date: 04/10/12  Admission Diagnoses:  GIST tumor of stomach  Discharge Diagnoses:  Same; post resection  Principal Problem:  *Gastric tumor-seen on laparoscopy 02/16/12 and workup in progress before lapband can be completed.   Surgery:  Laparoscopic resection of GIST with upper endoscopy  Discharged Condition: stable   Hospital Course:   Patient had above surgery.  Was observed over the weekend and discharged by the [facility] rounding doctors  Consults: none  Significant Diagnostic Studies: none    Discharge Exam: Blood pressure 123/71, pulse 86, temperature 97.8 F (36.6 C), temperature source Oral, resp. rate 18, height 5\' 7"  (1.702 m), weight 390 lb (176.903 kg), SpO2 100.00%. Physical exam per the [facility] rounding doctor  Disposition: 01-Home or Self Care  Discharge Orders    Future Appointments: Provider: Department: Dept Phone: Center:   04/28/2012 4:40 PM Valarie Merino, MD [facility], [place] [phone] None     Future Orders Please Complete By Expires   Diet - low sodium heart healthy      Increase activity slowly          Medication List     As of 04/13/2012  2:41 PM    TAKE these medications         ALPRAZolam 1 MG tablet   Commonly known as: XANAX   Take 1 mg by mouth 2 (two) times daily as needed. Anxiety      divalproex 125 MG capsule   Commonly known as: DEPAKOTE SPRINKLE   Take 500-1,000 mg by mouth 2 (two) times daily. Sprinkle kind of medication - patient takes 8 caps. every nite and then alternating 4 caps. and 8 caps. every other day in the am ( patient states there are 125 mg in each packet )      fluticasone 50 MCG/ACT nasal spray   Commonly known as: FLONASE   Place 2 sprays into the nose daily.      hydrochlorothiazide 25 MG tablet   Commonly known as: HYDRODIURIL   Take 25 mg by mouth every  morning.      HYDROcodone-acetaminophen 7.5-500 MG per tablet   Commonly known as: LORTAB   Take 1 tablet by mouth every 6 (six) hours as needed. Pain      lamoTRIgine 100 MG tablet   Commonly known as: LAMICTAL   Take 100 mg by mouth at bedtime.      lisinopril 20 MG tablet   Commonly known as: PRINIVIL,ZESTRIL   Take 20 mg by mouth at bedtime.      loratadine 10 MG tablet   Commonly known as: CLARITIN   Take 10 mg by mouth daily.      nystatin cream   Commonly known as: MYCOSTATIN   Apply 1 application topically 2 (two) times daily. Applies under breasts      omeprazole 40 MG capsule   Commonly known as: PRILOSEC   Take 40 mg by mouth daily.      oxyCODONE 5 MG immediate release tablet   Commonly known as: Oxy IR/ROXICODONE   Take 1-2 tablets (5-10 mg total) by mouth every 4 (four) hours as needed for pain. Pain      promethazine 25 MG tablet   Commonly known as: PHENERGAN   Take 1 tablet (25 mg total) by mouth every 6 (six) hours as needed for nausea. for nausea/vomiting  tiZANidine 4 MG tablet   Commonly known as: ZANAFLEX   Take 4 mg by mouth every 6 (six) hours as needed. Muscle spasm      triamcinolone ointment 0.1 %   Commonly known as: KENALOG   Apply 1 application topically 2 (two) times daily. Applies to left arm           Follow-up Information    Follow up with Luretha Murphy B, MD. Schedule an appointment as soon as possible for a visit in 2 weeks.   Contact information:   [address] [place] [address] [phone]          Signed: Valarie Merino 04/13/2012, 2:41 PM

## 2012-04-13 NOTE — Telephone Encounter (Signed)
Dr. Daphine Deutscher ordered a chest x-ray and CBC w/ diff.  Told pt we would call her with these results.

## 2012-04-14 ENCOUNTER — Telehealth (INDEPENDENT_AMBULATORY_CARE_PROVIDER_SITE_OTHER): Payer: Self-pay

## 2012-04-14 ENCOUNTER — Other Ambulatory Visit (INDEPENDENT_AMBULATORY_CARE_PROVIDER_SITE_OTHER): Payer: Self-pay | Admitting: Surgery

## 2012-04-14 ENCOUNTER — Other Ambulatory Visit (INDEPENDENT_AMBULATORY_CARE_PROVIDER_SITE_OTHER): Payer: Self-pay

## 2012-04-14 ENCOUNTER — Other Ambulatory Visit: Payer: Self-pay

## 2012-04-14 ENCOUNTER — Ambulatory Visit (HOSPITAL_COMMUNITY)
Admission: RE | Admit: 2012-04-14 | Discharge: 2012-04-14 | Disposition: A | Discharge status: Home / Self Care | Payer: Medicare Other | Source: Ambulatory Visit | Attending: Surgery | Admitting: Surgery

## 2012-04-14 DIAGNOSIS — R079 Chest pain, unspecified: Secondary | ICD-10-CM

## 2012-04-14 DIAGNOSIS — J9819 Other pulmonary collapse: Secondary | ICD-10-CM | POA: Insufficient documentation

## 2012-04-14 MED ORDER — IOHEXOL 350 MG/ML SOLN
100.0000 mL | Freq: Once | INTRAVENOUS | Status: AC | PRN
  Administered 2012-04-14: 100 mL via INTRAVENOUS

## 2012-04-14 NOTE — Telephone Encounter (Signed)
Called pt to let her know that I made her CT and EKG appointments with [facility] for this afternoon.  She was very Adult nurse.

## 2012-04-14 NOTE — Telephone Encounter (Signed)
Pt calling for labs and cxr result from yesterday. I would have given her the results but her labs were abn. Please review and advise pt. She can be reached at [phone].

## 2012-04-15 ENCOUNTER — Other Ambulatory Visit (INDEPENDENT_AMBULATORY_CARE_PROVIDER_SITE_OTHER): Payer: Self-pay | Admitting: Surgery

## 2012-04-15 ENCOUNTER — Telehealth (INDEPENDENT_AMBULATORY_CARE_PROVIDER_SITE_OTHER): Payer: Self-pay | Admitting: General Surgery

## 2012-04-15 MED ORDER — OXYCODONE-ACETAMINOPHEN 5-325 MG PO TABS: 1.0000 | ORAL_TABLET | Freq: Four times a day (QID) | ORAL | Status: DC | PRN

## 2012-04-15 NOTE — Telephone Encounter (Signed)
Pt called for refill of pain meds.  She is allergic (severe itching) to codeine, so cannot tolerate the Vicodin.  She has done OK with Percocet.  Can she have refill of Percocet?  Understands she will have to come in to pick up the script if it is written.  Please advise.

## 2012-04-15 NOTE — Telephone Encounter (Signed)
Spoke with pt and asked how she was feeling.  She explained she is still having pain in her shoulders and her neck.  I informed her that she should call her PCP to let them know as well.  I also informed her that Dr. Daphine Deutscher filled a Rx for oxycodone-acetaminophen 5-325 #20 o refills 1 tab Q6H and that this Rx was waiting at the front desk for her.  She said she would come get it.

## 2012-04-22 ENCOUNTER — Encounter: Payer: Self-pay | Admitting: Family Medicine

## 2012-04-22 ENCOUNTER — Ambulatory Visit (INDEPENDENT_AMBULATORY_CARE_PROVIDER_SITE_OTHER): Payer: Medicare Other | Admitting: Family Medicine

## 2012-04-22 VITALS — BP 141/63 | HR 116 | Temp 98.3°F | Ht 67.0 in | Wt 387.4 lb

## 2012-04-22 DIAGNOSIS — M75102 Unspecified rotator cuff tear or rupture of left shoulder, not specified as traumatic: Secondary | ICD-10-CM

## 2012-04-22 DIAGNOSIS — M67919 Unspecified disorder of synovium and tendon, unspecified shoulder: Secondary | ICD-10-CM

## 2012-04-22 DIAGNOSIS — M719 Bursopathy, unspecified: Secondary | ICD-10-CM

## 2012-04-22 MED ORDER — MELOXICAM 15 MG PO TABS: 15.0000 mg | ORAL_TABLET | Freq: Every day | ORAL | Status: DC

## 2012-04-22 NOTE — Assessment & Plan Note (Signed)
She presents with signs of rotator cuff pathology (strain) without obvious weakness indicative of full thickness tear. Steroid injection today. Stretching and shoulder strengthening exercises were shown, and she was advised to perform then regularly. She was advised to use meloxicam to help with inflammation. Follow-up in 4 weeks or sooner if pain significantly worsens or signs of adhesive capsulitis.

## 2012-04-22 NOTE — Progress Notes (Signed)
  Subjective:    Patient ID: [place] Garza, female    DOB: Jul 16, 1970, 41 y.o.   MRN: [id number]  HPI She presents with left shoulder pain.  She has had it for several months but it became markedly worse following her abdominal surgery (she had benign gastric tumor removed) on 04/10/2012. She describes the pain as aching but it hurts more with overhand or reaching back activities.   She denies inciting event.  She is taking oxycodone/percocet for her post-surgical pain but it does not provide significant relief for her shoulder pain  Review of Systems  Allergies, medication, past medical history reviewed.  -Depression -History of carpal tunnel bilaterally. She underwent surgery on right side -Morbid obesity -She is being worked up for lap band procedure. She needed gastric tumor removed first     Objective:   Physical Exam GEN: NAD; morbidly obese LEFT SHOULDER:    Inspection: No muscle wasting or winging   Ecchymosis/edema: neg    Cervical spine: NT, full ROM   Spurling's: neg   Tenderness along lateral and posterior shoulder    ROM: abduction 170 (180 on right), flexion 170 (180 on right)   Lift-off painful   Pain with resisted internal and external rotation   Positive full and emptoy can test   Negative Hawkins, positive Neers   Strength: 4-5/5 triceps, biceps (comparable to right) HANDS: positive Tinel's left wrist and positive Phalen's   Procedure: left subacromial shoulder glucocorticoid injection Informed written consent received  Due to her girth, her anatomy difficult to identify. She was told this prior to injection but still preferred trial of injection Area to be injected marked and then prepped with alcohol swabs x 2 Subacromial space marked 25 gauge needle inserted into space and 4 mL of 2% lidocaine without epinephrine and 40 mg of Kenalog injected Needle removed and area covered with band-aide Minimal blood loss  Patient informed that pain may improve in  next few hours but will be about the same prior to injection tomorrow morning. Expect improvement in 2 days and maximum affect in about a week. Patient expressed understanding. She was given red flags, notably signs of infection, that warrants prompt follow-up at clinic or ED    Assessment & Plan:

## 2012-04-22 NOTE — Patient Instructions (Addendum)
Take the meloxicam daily to help with pain and inflammation  Please do those exercises  Follow-up in 1 month

## 2012-04-28 ENCOUNTER — Ambulatory Visit (INDEPENDENT_AMBULATORY_CARE_PROVIDER_SITE_OTHER): Payer: Medicare Other | Admitting: Surgery

## 2012-04-28 ENCOUNTER — Encounter (INDEPENDENT_AMBULATORY_CARE_PROVIDER_SITE_OTHER): Payer: Self-pay | Admitting: Surgery

## 2012-04-28 VITALS — BP 136/74 | HR 86 | Temp 97.4°F | Resp 18 | Ht 67.0 in | Wt 386.2 lb

## 2012-04-28 DIAGNOSIS — D490 Neoplasm of unspecified behavior of digestive system: Secondary | ICD-10-CM

## 2012-04-28 NOTE — Progress Notes (Signed)
Doing well after her resection of her gist tumor. I told her I think I will see her back in 6 weeks we can discuss upcoming bariatric surgery. Like to wait for 3 months before scheduling her for possible lap band in that region.

## 2012-05-24 ENCOUNTER — Ambulatory Visit (INDEPENDENT_AMBULATORY_CARE_PROVIDER_SITE_OTHER): Payer: Medicare Other | Admitting: Family Medicine

## 2012-05-24 ENCOUNTER — Encounter: Payer: Self-pay | Admitting: Family Medicine

## 2012-05-24 VITALS — BP 161/84 | HR 96 | Temp 98.4°F | Ht 67.0 in | Wt 384.0 lb

## 2012-05-24 DIAGNOSIS — I10 Essential (primary) hypertension: Secondary | ICD-10-CM

## 2012-05-24 DIAGNOSIS — M75102 Unspecified rotator cuff tear or rupture of left shoulder, not specified as traumatic: Secondary | ICD-10-CM

## 2012-05-24 DIAGNOSIS — M67919 Unspecified disorder of synovium and tendon, unspecified shoulder: Secondary | ICD-10-CM

## 2012-05-24 MED ORDER — TIZANIDINE HCL 4 MG PO TABS: 4.0000 mg | ORAL_TABLET | Freq: Four times a day (QID) | ORAL | Status: DC | PRN

## 2012-05-24 MED ORDER — OXYCODONE-ACETAMINOPHEN 5-325 MG PO TABS: 1.0000 | ORAL_TABLET | Freq: Three times a day (TID) | ORAL | Status: DC | PRN

## 2012-05-24 MED ORDER — LISINOPRIL-HYDROCHLOROTHIAZIDE 20-25 MG PO TABS: 1.0000 | ORAL_TABLET | Freq: Every day | ORAL | Status: DC

## 2012-05-24 MED ORDER — OMEPRAZOLE 40 MG PO CPDR: 40.0000 mg | DELAYED_RELEASE_CAPSULE | Freq: Every day | ORAL | Status: DC

## 2012-05-24 MED ORDER — MELOXICAM 15 MG PO TABS: 15.0000 mg | ORAL_TABLET | Freq: Every day | ORAL | Status: DC

## 2012-05-24 MED ORDER — LORATADINE 10 MG PO TABS: 10.0000 mg | ORAL_TABLET | Freq: Every day | ORAL | Status: DC

## 2012-05-24 NOTE — Assessment & Plan Note (Signed)
She continues to wait on lapband due to gastric tumor. Interested in weight loss such as dieting and water aerobics. I have encouraged her to continue this path and not give up.

## 2012-05-24 NOTE — Progress Notes (Signed)
Patient ID: [place] Garza, female   DOB: April 24, 1971, 42 y.o.   MRN: [id number] [facility] Keeva Reisen M. Fines Kimberlin, MD Phone: [phone]  Subjective: HPI: Patient is a 42 y.o. female presenting to clinic today for follow up appointment. Concerns today include left shoulder pain  1. Weight Loss- Pt went to lap band surgery in November, found to have gastric tumor. Then had tumor removal in Dec, but did not have lapband or bypass at that time.   2. Shoulder pain- Had injection in December, but did not help much. Has had EKG and r/o PE, both negative. Has been using heat, Vicodin and doing exercises. Hurts all the way down her arm. Has Zanaflex and Mobic as well. Has seen Dr. Jorge Mandril who has only recommended close follow up. Worst with lying on right side and also in bed (Would also rather have Percocet than Vicodin.)   3. Hypertension Blood pressure at home: Does not check Blood pressure today: 160's in triage Taking Meds: Yes, lisinopril and HCTZ Side effects: None ROS: Denies headache visual changes nausea, vomiting, chest pain, abdominal pain or shortness of breath.  History Reviewed: Non smoker. Health Maintenance: Had flu shot. Needs pap in May 2014  ROS: Please see HPI above.  Objective: Office vital signs reviewed.  Physical Examination:  General: Awake, alert. NAD. Very pleasant  HEENT: Atraumatic, normocephalic. Neck: No masses palpated. No LAD Pulm: CTAB, no wheezes Cardio: RRR, no murmurs appreciated Abdomen:Obese, +BS, soft, nontender, 5 healed incisions with no pain  Extremities: No edema Neuro: Grossly intact  Assessment: 42 yo F follow up appointment  Plan: See Problem List and After Visit Summary

## 2012-05-24 NOTE — Assessment & Plan Note (Signed)
Not making any changes to medications. Will combine Lisinopril and HCTZ so she only has to take one pill. Continue to monitor BP, and increase lisinopril in May, if needed.

## 2012-05-24 NOTE — Assessment & Plan Note (Signed)
Continue exercises, heat and ROM. Given Rx for Percocet. (d/c vicodin and oxycodone on her medication list.) Patient should continue to f/u with ortho and see me prn.

## 2012-05-24 NOTE — Patient Instructions (Signed)
It was good to see you today.  I have changed your Lisinopril-HCTZ to a combined pill that you will take once per day. I also changed your vicodin to Percocet. When you need a refill, please call the office a few days in advance and you can pick it up at the front desk.  You can ask Dr. Tomasa Rand about Effexor or Cymbalta. These medications can help with pain as well.  I will see you in May for your physical exam, or earlier if needed.  Alean Kromer M. Breyana Follansbee, M.D.

## 2012-05-31 ENCOUNTER — Ambulatory Visit (INDEPENDENT_AMBULATORY_CARE_PROVIDER_SITE_OTHER): Payer: Medicare Other | Admitting: Family Medicine

## 2012-05-31 ENCOUNTER — Encounter: Payer: Self-pay | Admitting: Family Medicine

## 2012-05-31 VITALS — BP 131/80 | HR 89 | Temp 98.0°F | Ht 67.0 in | Wt 381.0 lb

## 2012-05-31 DIAGNOSIS — R109 Unspecified abdominal pain: Secondary | ICD-10-CM

## 2012-05-31 DIAGNOSIS — R300 Dysuria: Secondary | ICD-10-CM

## 2012-05-31 DIAGNOSIS — R10A0 Flank pain, unspecified side: Secondary | ICD-10-CM

## 2012-05-31 DIAGNOSIS — M549 Dorsalgia, unspecified: Secondary | ICD-10-CM

## 2012-05-31 LAB — POCT UA - MICROSCOPIC ONLY

## 2012-05-31 LAB — POCT URINALYSIS DIPSTICK
Bilirubin, UA: NEGATIVE
Glucose, UA: NEGATIVE
Ketones, UA: NEGATIVE
Leukocytes, UA: NEGATIVE
Nitrite, UA: NEGATIVE
Protein, UA: NEGATIVE
Spec Grav, UA: 1.025
Urobilinogen, UA: 0.2
pH, UA: 6.5

## 2012-05-31 MED ORDER — TAMSULOSIN HCL 0.4 MG PO CAPS: 0.4000 mg | ORAL_CAPSULE | Freq: Every day | ORAL | Status: DC

## 2012-05-31 MED ORDER — CEPHALEXIN 500 MG PO CAPS: 500.0000 mg | ORAL_CAPSULE | Freq: Three times a day (TID) | ORAL | Status: DC

## 2012-05-31 NOTE — Progress Notes (Signed)
  Subjective:    Patient ID: [place] Garza, female    DOB: 12/04/1970, 42 y.o.   MRN: [id number]  HPI  Crystal Garza comes in with back pain radiating to her abdomen that started three days ago.  She has a history of kidney stones.  She says it is on her right side.  She also endorses some nausea and pain with urination.  Her urine is dark and smelly.  She denies fever.   Review of Systems Pertinent items in HPI    Objective:   Physical Exam BP 131/80  Pulse 89  Temp 98 F (36.7 C) (Oral)  Ht 5\' 7"  (1.702 m)  Wt 381 lb (172.82 kg)  BMI 59.67 kg/m2 General appearance: alert and cooperative, morbidly obese, comfortable, no distress Back: Mild tenderness to palpation on right side, not specifically at CVA Lungs: clear to auscultation bilaterally Heart: regular rate and rhythm, S1, S2 normal, no murmur, click, rub or gallop Abdomen: soft, non-tender; bowel sounds normal; no masses,  no organomegaly Pulses: 2+ and symmetric      Assessment & Plan:

## 2012-05-31 NOTE — Assessment & Plan Note (Signed)
UA has blood, but also whites and bacteria concerning for a UTI, will treat with Keflex 500 mg po TID x 1 week, f/u in one week if not improved.

## 2012-05-31 NOTE — Assessment & Plan Note (Signed)
Radiating pain and blood on UA and micro consistent with Kidney stone.  Will start flomax and gave her a urine strainer.  She has percocet to take for pain, f/u with PCP in one week if not improved.

## 2012-05-31 NOTE — Patient Instructions (Signed)
Take the antibiotic Keflex three times a day for a UTI.  Take the Flomax daily and use the strainer to make sure we know if the stone passes.  Please use the pain medication you have for pain.   Please make an appointment to see Dr. Mikel Cella in one week if you are not better.

## 2012-06-06 ENCOUNTER — Other Ambulatory Visit: Payer: Self-pay | Admitting: Family Medicine

## 2012-06-06 ENCOUNTER — Telehealth (INDEPENDENT_AMBULATORY_CARE_PROVIDER_SITE_OTHER): Payer: Self-pay

## 2012-06-06 MED ORDER — OXYCODONE-ACETAMINOPHEN 5-325 MG PO TABS: 1.0000 | ORAL_TABLET | Freq: Three times a day (TID) | ORAL | Status: DC | PRN

## 2012-06-06 NOTE — Telephone Encounter (Signed)
Patient is calling for a refill on her Percocet.  Please call when ready for pick up.

## 2012-06-06 NOTE — Telephone Encounter (Signed)
Called pt to let them know that I have changed their appt to Thursday, Feb 13th @ 2p

## 2012-06-06 NOTE — Telephone Encounter (Signed)
Rx ready and available for pick up. Thanks!

## 2012-06-08 NOTE — Telephone Encounter (Signed)
Pt informed. Crystal Garza  

## 2012-06-13 ENCOUNTER — Other Ambulatory Visit (INDEPENDENT_AMBULATORY_CARE_PROVIDER_SITE_OTHER): Payer: Self-pay | Admitting: Surgery

## 2012-06-13 DIAGNOSIS — N6001 Solitary cyst of right breast: Secondary | ICD-10-CM

## 2012-06-15 ENCOUNTER — Encounter (INDEPENDENT_AMBULATORY_CARE_PROVIDER_SITE_OTHER): Payer: Medicare Other | Admitting: Surgery

## 2012-06-16 ENCOUNTER — Encounter (INDEPENDENT_AMBULATORY_CARE_PROVIDER_SITE_OTHER): Payer: Medicare Other | Admitting: Surgery

## 2012-06-17 ENCOUNTER — Telehealth (INDEPENDENT_AMBULATORY_CARE_PROVIDER_SITE_OTHER): Payer: Self-pay

## 2012-06-17 NOTE — Telephone Encounter (Signed)
Spoke with pt, giving her her new appt date and time: Tuesday, March 4th @ 4p

## 2012-07-05 ENCOUNTER — Encounter (INDEPENDENT_AMBULATORY_CARE_PROVIDER_SITE_OTHER): Payer: Self-pay | Admitting: Surgery

## 2012-07-05 ENCOUNTER — Ambulatory Visit (INDEPENDENT_AMBULATORY_CARE_PROVIDER_SITE_OTHER): Payer: Medicare Other | Admitting: Surgery

## 2012-07-05 NOTE — Progress Notes (Signed)
Crystal Garza 42 y.o.  Body mass index is 60.29 kg/(m^2).  Patient Active Problem List  Diagnosis  . Morbid obesity-BMI 62  . DEPRESSION, MAJOR, RECURRENT  . MIGRAINE, CHRONIC  . HYPERTENSION, BENIGN SYSTEMIC  . RHINITIS, ALLERGIC  . GASTROESOPHAGEAL REFLUX, NO ESOPHAGITIS  . OSTEOARTHRITIS, KNEES, BILATERAL  . Iron deficiency anemia  . Anxiety  . Back pain  . Eczematous dermatitis  . Arm paresthesia, left  . Nocturia  . Nephrolithiasis  . Need for prophylactic vaccination and inoculation against influenza  . Skin yeast infection  . Peripheral edema  . Encounter for annual health examination  . Bipolar disorder  . 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  . Rotator cuff syndrome of left shoulder  . Dysuria    Allergies  Allergen Reactions  . Codeine Rash    Past Surgical History  Procedure Laterality Date  . Abdominal hysterectomy    . Knee arthroscopy    . Cesarean section  05/1991  . Breath tek h pylori  07/28/2011    Procedure: BREATH TEK H PYLORI;  Surgeon: Valarie Merino, MD;  Location: [facility];  Service: General;  Laterality: N/A;  to be done at 745  . Esophageal biopsy  02/16/2012    Procedure: BIOPSY;  Surgeon: Valarie Merino, MD;  Location: [facility];  Service: General;;  biopsy of mass x 2  . Eus  03/03/2012    Procedure: UPPER ENDOSCOPIC ULTRASOUND (EUS) LINEAR;  Surgeon: Rachael Fee, MD;  Location: [facility];  Service: Endoscopy;  Laterality: N/A;  . Laparoscopic gastric banding    . Tubal ligation    . Laparoscopic gastrectomy  04/08/2012    Procedure: LAPAROSCOPIC GASTRECTOMY;  Surgeon: Valarie Merino, MD;  Location: [facility];  Service: General;;  removal of GIST tumor of stomach   HAIRFORD, AMBER, MD No diagnosis found.  Crystal Garza is sufficiently recovered from the staple gastrectomy of the Gist tumor. We talked about lap band versus sleeve which she's studied and she is leaning  toward a sleeve at this point. I want to pass cystic-year-old who she see if we can improve her to move 4 with a gastric sleeve. She previously been approved to go ahead and and have a gastric banding. I will pass this to Okey Regal and await and then we'll try to get her on the schedule and I'll see her back in the office prior to her actual surgery. [name], MD, [facility] [phone] beeper [phone]  07/05/2012 5:07 PM

## 2012-07-05 NOTE — Patient Instructions (Addendum)
Will work with Crystal Garza to approve sleeve gastrectomy

## 2012-07-11 ENCOUNTER — Telehealth: Payer: Self-pay | Admitting: Family Medicine

## 2012-07-11 NOTE — Telephone Encounter (Signed)
Pt is asking for refill on her percocet - pls call when ready

## 2012-07-12 MED ORDER — OXYCODONE-ACETAMINOPHEN 5-325 MG PO TABS: 1.0000 | ORAL_TABLET | Freq: Three times a day (TID) | ORAL | Status: DC | PRN

## 2012-07-12 NOTE — Telephone Encounter (Signed)
LMOVM for pt to return call .Fleeger, Jessica Dawn  

## 2012-07-12 NOTE — Telephone Encounter (Signed)
Rx filled and available for pick up. There is a note that she had Oxycodone 5mg  filled on 06/30/12 but I am not sure where that was from? Please remind patient that she should only get her Rx from [name].  Thank you! Amber M. Hairford, M.D.

## 2012-07-28 ENCOUNTER — Other Ambulatory Visit: Payer: Self-pay | Admitting: Family Medicine

## 2012-07-28 DIAGNOSIS — M25512 Pain in left shoulder: Secondary | ICD-10-CM

## 2012-08-02 ENCOUNTER — Ambulatory Visit
Admission: RE | Admit: 2012-08-02 | Discharge: 2012-08-02 | Disposition: A | Discharge status: Home / Self Care | Payer: Medicare Other | Source: Ambulatory Visit | Attending: Surgery | Admitting: Surgery

## 2012-08-02 DIAGNOSIS — N6001 Solitary cyst of right breast: Secondary | ICD-10-CM

## 2012-08-03 ENCOUNTER — Telehealth: Payer: Self-pay | Admitting: Family Medicine

## 2012-08-03 ENCOUNTER — Other Ambulatory Visit: Payer: Self-pay | Admitting: Family Medicine

## 2012-08-03 ENCOUNTER — Other Ambulatory Visit: Payer: Medicare Other

## 2012-08-03 MED ORDER — OXYCODONE HCL 5 MG PO TABS
5.0000 mg | ORAL_TABLET | Freq: Four times a day (QID) | ORAL | Status: DC | PRN
Start: 2012-08-03 — End: 2012-08-17

## 2012-08-03 NOTE — Telephone Encounter (Signed)
Rx printed and left up front for pick up. Thanks, [employer]. Rilya Longo, M.D.

## 2012-08-03 NOTE — Telephone Encounter (Signed)
Pt is needing refill on her oxycodone - pls call when ready

## 2012-08-03 NOTE — Telephone Encounter (Signed)
Will fwd to MD.  .Shemeca Lukasik L  

## 2012-08-04 ENCOUNTER — Ambulatory Visit
Admission: RE | Admit: 2012-08-04 | Discharge: 2012-08-04 | Disposition: A | Discharge status: Home / Self Care | Payer: Medicare Other | Source: Ambulatory Visit | Attending: Family Medicine | Admitting: Family Medicine

## 2012-08-04 ENCOUNTER — Encounter: Payer: Medicare Other | Attending: Surgery

## 2012-08-04 DIAGNOSIS — M25512 Pain in left shoulder: Secondary | ICD-10-CM

## 2012-08-04 DIAGNOSIS — E669 Obesity, unspecified: Secondary | ICD-10-CM | POA: Insufficient documentation

## 2012-08-04 DIAGNOSIS — Z01818 Encounter for other preprocedural examination: Secondary | ICD-10-CM | POA: Insufficient documentation

## 2012-08-04 DIAGNOSIS — Z713 Dietary counseling and surveillance: Secondary | ICD-10-CM | POA: Insufficient documentation

## 2012-08-04 NOTE — Telephone Encounter (Signed)
Pt notified. .Crystal Garza L  

## 2012-08-10 NOTE — Progress Notes (Signed)
Surgery schedled for 08/29/12.  Preop on 08/22/12.  Need orders in EPIC.  Thanks.

## 2012-08-17 ENCOUNTER — Encounter (HOSPITAL_COMMUNITY): Payer: Self-pay | Admitting: Pharmacy Technician

## 2012-08-21 NOTE — Progress Notes (Signed)
Bariatric Class:  Appt start time: 1730  End time: 1830.  Pre-Operative Nutrition Class  Patient was seen on 08/04/12 for Pre-Operative Bariatric Surgery Education at [facility].   Surgery date: 08/29/12 Surgery type: Sleeve Start weight at [facility]: 388.5 lbs (09/05/11)  Weight today: 383.0 lbs Weight change: 5.5 lbs Total weight lost: 5.5 lbs  TANITA BODY COMP RESULTS   09/05/11  08/04/12   BMI (kg/m^2)  60.8 60.0   Fat Mass (lbs)  236.5  232.0   Fat Free Mass (lbs)  152.0  151.0   Total Body Water (lbs)  111.0  110.5   Samples given per MNT protocol: Bariatric Advantage Multivitamin Lot # [id number] Exp: 06/15  Bariatric Advantage Calcium Citrate Lot # [id number] Exp:10/15  Bariatric Advantage Sublingual B12 Lot # [id number] Exp: 10/15  Celebrate Vitamins Multivitamin Lot # [id number] Exp: 11/14  Celebrate Vitamins Calcium Citrate Lot # [id number] Exp: 09/15  Corliss Marcus Protein Powder Lot # [id number] Exp: 06/15  The following the learning objective met by the patient during this course:  Identifies Pre-Op Dietary Goals and will begin 2 weeks pre-operatively  Identifies appropriate sources of fluids and proteins   States protein recommendations and appropriate sources pre and post-operatively  Identifies Post-Operative Dietary Goals and will follow for 2 weeks post-operatively  Identifies appropriate multivitamin and calcium sources  Describes the need for physical activity post-operatively and will follow MD recommendations  States when to call healthcare provider regarding medication questions or post-operative complications  Handouts given during class include:  Pre-Op Bariatric Surgery Diet Handout  Protein Shake Handout  Post-Op Bariatric Surgery Nutrition Handout  BELT Program Information Flyer  Support Group Information Flyer  [facility] Bariatric Supplements Price List  Follow-Up Plan: Patient will follow-up at [facility] 2 weeks  post operatively for diet advancement per MD.

## 2012-08-21 NOTE — Patient Instructions (Signed)
Follow:   Pre-Op Diet per MD 2 weeks prior to surgery  Phase 2- Liquids (clear/full) 2 weeks after surgery  Vitamin/Mineral/Calcium guidelines for purchasing bariatric supplements  Exercise guidelines pre and post-op per MD  Follow-up at [facility] in 2 weeks post-op for diet advancement. Contact Coen Miyasato as needed with questions/concerns. 

## 2012-08-22 ENCOUNTER — Encounter (HOSPITAL_COMMUNITY): Payer: Self-pay

## 2012-08-22 ENCOUNTER — Encounter (HOSPITAL_COMMUNITY)
Admission: RE | Admit: 2012-08-22 | Discharge: 2012-08-22 | Disposition: A | Discharge status: Home / Self Care | Payer: Medicare Other | Source: Ambulatory Visit | Attending: Surgery | Admitting: Surgery

## 2012-08-22 HISTORY — DX: Angina pectoris, unspecified: I20.9

## 2012-08-22 LAB — BASIC METABOLIC PANEL
BUN: 10 mg/dL (ref 6–23)
CO2: 27 mEq/L (ref 19–32)
Calcium: 10.1 mg/dL (ref 8.4–10.5)
Chloride: 101 mEq/L (ref 96–112)
Creatinine, Ser: 0.64 mg/dL (ref 0.50–1.10)
GFR calc Af Amer: >90 mL/min (ref >90)
GFR calc non Af Amer: >90 mL/min (ref >90)
Glucose, Bld: 86 mg/dL (ref 70–99)
Potassium: 3.6 mEq/L (ref 3.5–5.1)
Sodium: 136 mEq/L (ref 135–145)

## 2012-08-22 LAB — CBC
HCT: 39.3 % (ref 36.0–46.0)
Hemoglobin: 12.6 g/dL (ref 12.0–15.0)
MCH: 25 pg — ABNORMAL LOW (ref 26.0–34.0)
MCHC: 32.1 g/dL (ref 30.0–36.0)
MCV: 78.1 fL (ref 78.0–100.0)
Platelets: 523 10*3/uL — ABNORMAL HIGH (ref 150–400)
RBC: 5.03 MIL/uL (ref 3.87–5.11)
RDW: 15.9 % — ABNORMAL HIGH (ref 11.5–15.5)
WBC: 14 10*3/uL — ABNORMAL HIGH (ref 4.0–10.5)

## 2012-08-22 LAB — SURGICAL PCR SCREEN
MRSA, PCR: NEGATIVE
Staphylococcus aureus: NEGATIVE

## 2012-08-22 NOTE — Progress Notes (Signed)
CT chest 04/14/12 on EPIC, EKG 04/14/12 on EPIC

## 2012-08-22 NOTE — Patient Instructions (Addendum)
20 ASHYIA SCHRAEDER  08/22/2012   Your procedure is scheduled on: 08/29/12  Report to [facility] at 0515 AM.  Call this number if you have problems the morning of surgery 336-: [phone]   Remember:   Do not eat food or drink liquids After Midnight.     Take these medicines the morning of surgery with A SIP OF WATER: claritin, oxycodone if needed, xanax if needed, prilosec, depakote   Do not wear jewelry, make-up or nail polish.  Do not wear lotions, powders, or perfumes. You may wear deodorant.  Do not shave 48 hours prior to surgery. Men may shave face and neck.  Do not bring valuables to the hospital.  Contacts, dentures or bridgework may not be worn into surgery.  Leave suitcase in the car. After surgery it may be brought to your room.  For patients admitted to the hospital, checkout time is 11:00 AM the day of discharge.    Please read over the following fact sheets that you were given: MRSA Information.  Birdie Sons, RN  pre op nurse call if needed [phone]    FAILURE TO FOLLOW THESE INSTRUCTIONS MAY RESULT IN CANCELLATION OF YOUR SURGERY   Patient Signature: ___________________________________________

## 2012-08-22 NOTE — Progress Notes (Signed)
We need pre-op orders for surgery on 08/29/12 at 0700 please.

## 2012-08-25 ENCOUNTER — Ambulatory Visit (INDEPENDENT_AMBULATORY_CARE_PROVIDER_SITE_OTHER): Payer: Medicare Other | Admitting: Surgery

## 2012-08-25 DIAGNOSIS — I10 Essential (primary) hypertension: Secondary | ICD-10-CM

## 2012-08-25 DIAGNOSIS — K2100 Gastro-esophageal reflux disease with esophagitis, without bleeding: Secondary | ICD-10-CM

## 2012-08-25 DIAGNOSIS — Z6841 Body Mass Index (BMI) 40.0 and over, adult: Secondary | ICD-10-CM

## 2012-08-25 NOTE — Patient Instructions (Signed)

## 2012-08-25 NOTE — Progress Notes (Signed)
Chief Complaint:  Morbid obesity BMI 58  History of Present Illness:  Crystal Garza is an 42 y.o. female is set for Gastric sleeve this coming Monday. She had previous gist tumor removed. She is also at for this surgery. She has no further questions. She has been very compliant and has lost weight down to 374 which puts her BMI now at 58.  She is aware of the risk benefits of sleeve gastrectomy and we will proceed with this operation on Monday  Past Medical History  Diagnosis Date  . Anemia     iron def  . Hypertension   . Morbid obesity   . GERD (gastroesophageal reflux disease)   . Anxiety   . Depression   . Migraine   . Back pain   . Chronic knee pain   . Arthritis     left knee   . Hx of laparoscopic gastric banding   . Anginal pain     Past Surgical History  Procedure Laterality Date  . Abdominal hysterectomy    . Knee arthroscopy    . Cesarean section  05/1991  . Breath tek h pylori  07/28/2011    Procedure: BREATH TEK H PYLORI;  Surgeon: Kennya Schwenn B Dalayla Aldredge, MD;  Location: [facility];  Service: General;  Laterality: N/A;  to be done at 745  . Esophageal biopsy  02/16/2012    Procedure: BIOPSY;  Surgeon: Donny Heffern B Aaliyah Gavel, MD;  [facility]: [facility];  Service: General;;  biopsy of mass x 2  . Eus  03/03/2012    Procedure: UPPER ENDOSCOPIC ULTRASOUND (EUS) LINEAR;  Surgeon: Daniel P Jacobs, MD;  Location: [facility];  Service: Endoscopy;  Laterality: N/A;  . Laparoscopic gastric banding    . Tubal ligation    . Laparoscopic gastrectomy  04/08/2012    Procedure: LAPAROSCOPIC GASTRECTOMY;  Surgeon: Zion Lint B Quan Cybulski, MD;  Location: [facility];  Service: General;;  removal of GIST tumor of stomach    Current Outpatient Prescriptions  Medication Sig Dispense Refill  . ALPRAZolam (XANAX) 1 MG tablet Take 1 mg by mouth 2 (two) times daily as needed for anxiety.       . divalproex (DEPAKOTE SPRINKLE) 125 MG capsule Take 500-1,000 mg by mouth 2 (two) times daily. Sprinkle kind of  medication - patient takes 8 caps. every nite and then alternating 4 caps. and 8 caps. every other day in the am ( patient states there are 125 mg in each packet )      . fluticasone (FLONASE) 50 MCG/ACT nasal spray Place 2 sprays into the nose daily.      . lamoTRIgine (LAMICTAL) 100 MG tablet Take 100 mg by mouth at bedtime.      . lisinopril-hydrochlorothiazide (PRINZIDE,ZESTORETIC) 20-25 MG per tablet Take 1 tablet by mouth at bedtime.      . loratadine (CLARITIN) 10 MG tablet Take 1 tablet (10 mg total) by mouth daily.  30 tablet  11  . meloxicam (MOBIC) 15 MG tablet Take 15 mg by mouth daily as needed for pain.      . nystatin cream (MYCOSTATIN) Apply 1 application topically 2 (two) times daily. Applies under breasts      . omeprazole (PRILOSEC) 40 MG capsule Take 40 mg by mouth daily.      . oxyCODONE-acetaminophen (ROXICET) 5-325 MG per tablet Take 1 tablet by mouth every 8 (eight) hours as needed for pain.  60 tablet  0  . promethazine (PHENERGAN) 25 MG tablet Take 1 tablet (  25 mg total) by mouth every 6 (six) hours as needed for nausea. for nausea/vomiting  20 tablet  2  . tiZANidine (ZANAFLEX) 4 MG tablet Take 1 tablet (4 mg total) by mouth every 6 (six) hours as needed. Muscle spasm  30 tablet  2  . triamcinolone ointment (KENALOG) 0.1 % Apply 1 application topically 2 (two) times daily as needed. Applies to left arm       No current facility-administered medications for this visit.   Codeine Family History  Problem Relation Age of Onset  . Diabetes Mother   . Cancer Father     brain tumor, colon  . Diabetes Father   . Heart disease Father   . Heart disease Paternal Uncle   . Diabetes Paternal Uncle   . Diabetes Maternal Grandmother   . Heart disease Paternal Grandmother   . Heart disease Paternal Grandfather   . Diabetes Maternal Aunt   . Diabetes Maternal Uncle   . Diabetes Paternal Aunt   . Diabetes Maternal Grandfather    Social History:   reports that she has never  smoked. She has never used smokeless tobacco. She reports that  drinks alcohol. She reports that she does not use illicit drugs.   REVIEW OF SYSTEMS - PERTINENT POSITIVES ONLY: Nothing new  Physical Exam:   Blood pressure 128/84, pulse 88, temperature 97.6 F (36.4 C), resp. rate 22, height 5' 7" (1.702 m), weight 374 lb (169.645 kg). Body mass index is 58.56 kg/(m^2).  Gen:  WDWN AAF NAD  Neurological: Alert and oriented to person, place, and time. Motor and sensory function is grossly intact  Head: Normocephalic and atraumatic.  Eyes: Conjunctivae are normal. Pupils are equal, round, and reactive to light. No scleral icterus.  Neck: Normal range of motion. Neck supple. No tracheal deviation or thyromegaly present.  Cardiovascular:  SR without murmurs or gallops.  No carotid bruits Respiratory: Effort normal.  No respiratory distress. No chest wall tenderness. Breath sounds normal.  No wheezes, rales or rhonchi.  Abdomen:  Incisions have healed.   GU: Musculoskeletal: Normal range of motion. Extremities are nontender. No cyanosis, edema or clubbing noted Lymphadenopathy: No cervical, preauricular, postauricular or axillary adenopathy is present Skin: Skin is warm and dry. No rash noted. No diaphoresis. No erythema. No pallor. Pscyh: Normal mood and affect. Behavior is normal. Judgment and thought content normal.   LABORATORY RESULTS: No results found for this or any previous visit (from the past 48 hour(s)).  RADIOLOGY RESULTS: No results found.  Problem List: Patient Active Problem List  Diagnosis  . Morbid obesity-BMI 62  . DEPRESSION, MAJOR, RECURRENT  . MIGRAINE, CHRONIC  . HYPERTENSION, BENIGN SYSTEMIC  . RHINITIS, ALLERGIC  . GASTROESOPHAGEAL REFLUX, NO ESOPHAGITIS  . OSTEOARTHRITIS, KNEES, BILATERAL  . Iron deficiency anemia  . Anxiety  . Back pain  . Eczematous dermatitis  . Arm paresthesia, left  . Nocturia  . Nephrolithiasis  . Need for prophylactic  vaccination and inoculation against influenza  . Skin yeast infection  . Peripheral edema  . Encounter for annual health examination  . Bipolar disorder  . 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  . Rotator cuff syndrome of left shoulder  . Dysuria    Assessment & Plan: Morbid obesity with history of GIST tumor removal.  Plan sleeve gastrectomy on Monday.      Matt B. Spiro Ausborn, MD, FACS  [facility] [phone] beeper [phone]  08/25/2012 12:50   PM      

## 2012-08-28 ENCOUNTER — Other Ambulatory Visit (INDEPENDENT_AMBULATORY_CARE_PROVIDER_SITE_OTHER): Payer: Self-pay | Admitting: Surgery

## 2012-08-29 ENCOUNTER — Encounter (HOSPITAL_COMMUNITY): Payer: Self-pay | Admitting: Anesthesiology

## 2012-08-29 ENCOUNTER — Inpatient Hospital Stay (HOSPITAL_COMMUNITY)
Admission: RE | Admit: 2012-08-29 | Discharge: 2012-09-01 | DRG: 621 | Disposition: A | Discharge status: Home / Self Care | Payer: Medicare Other | Source: Ambulatory Visit | Attending: Surgery | Admitting: Surgery

## 2012-08-29 ENCOUNTER — Inpatient Hospital Stay (HOSPITAL_COMMUNITY): Payer: Medicare Other | Admitting: Anesthesiology

## 2012-08-29 ENCOUNTER — Encounter (HOSPITAL_COMMUNITY)
Admission: RE | Disposition: A | Discharge status: Home / Self Care | Payer: Self-pay | Source: Ambulatory Visit | Attending: Surgery

## 2012-08-29 ENCOUNTER — Encounter (HOSPITAL_COMMUNITY): Payer: Self-pay | Admitting: *Deleted

## 2012-08-29 DIAGNOSIS — Z6841 Body Mass Index (BMI) 40.0 and over, adult: Secondary | ICD-10-CM

## 2012-08-29 DIAGNOSIS — I10 Essential (primary) hypertension: Secondary | ICD-10-CM

## 2012-08-29 DIAGNOSIS — F411 Generalized anxiety disorder: Secondary | ICD-10-CM | POA: Diagnosis present

## 2012-08-29 DIAGNOSIS — F319 Bipolar disorder, unspecified: Secondary | ICD-10-CM | POA: Diagnosis present

## 2012-08-29 DIAGNOSIS — K219 Gastro-esophageal reflux disease without esophagitis: Secondary | ICD-10-CM | POA: Diagnosis present

## 2012-08-29 DIAGNOSIS — Z79899 Other long term (current) drug therapy: Secondary | ICD-10-CM

## 2012-08-29 DIAGNOSIS — D509 Iron deficiency anemia, unspecified: Secondary | ICD-10-CM | POA: Diagnosis present

## 2012-08-29 DIAGNOSIS — K2100 Gastro-esophageal reflux disease with esophagitis, without bleeding: Secondary | ICD-10-CM

## 2012-08-29 DIAGNOSIS — M1710 Unilateral primary osteoarthritis, unspecified knee: Secondary | ICD-10-CM

## 2012-08-29 DIAGNOSIS — IMO0002 Reserved for concepts with insufficient information to code with codable children: Secondary | ICD-10-CM

## 2012-08-29 HISTORY — PX: ESOPHAGOGASTRODUODENOSCOPY: SHX5428

## 2012-08-29 HISTORY — PX: LAPAROSCOPIC GASTRIC SLEEVE RESECTION: SHX5895

## 2012-08-29 LAB — CBC
HCT: 39 % (ref 36.0–46.0)
Hemoglobin: 12.3 g/dL (ref 12.0–15.0)
MCH: 24.8 pg — ABNORMAL LOW (ref 26.0–34.0)
MCHC: 31.5 g/dL (ref 30.0–36.0)
MCV: 78.6 fL (ref 78.0–100.0)
Platelets: 436 10*3/uL — ABNORMAL HIGH (ref 150–400)
RBC: 4.96 MIL/uL (ref 3.87–5.11)
RDW: 15.8 % — ABNORMAL HIGH (ref 11.5–15.5)
WBC: 27 10*3/uL — ABNORMAL HIGH (ref 4.0–10.5)

## 2012-08-29 LAB — CREATININE, SERUM
Creatinine, Ser: 0.87 mg/dL (ref 0.50–1.10)
GFR calc Af Amer: >90 mL/min (ref >90)
GFR calc non Af Amer: 80 mL/min — ABNORMAL LOW (ref >90)

## 2012-08-29 LAB — HEMOGLOBIN AND HEMATOCRIT, BLOOD
HCT: 38.3 % (ref 36.0–46.0)
Hemoglobin: 11.9 g/dL — ABNORMAL LOW (ref 12.0–15.0)

## 2012-08-29 SURGERY — GASTRECTOMY, SLEEVE, LAPAROSCOPIC
Anesthesia: General | Site: Esophagus | Wound class: Clean Contaminated

## 2012-08-29 MED ORDER — UNJURY VANILLA POWDER
2.0000 [oz_av] | Freq: Four times a day (QID) | ORAL | Status: DC
  Administered 2012-08-31 (×3): 2 [oz_av] via ORAL

## 2012-08-29 MED ORDER — UNJURY CHICKEN SOUP POWDER: 2.0000 [oz_av] | Freq: Four times a day (QID) | ORAL | Status: DC

## 2012-08-29 MED ORDER — NEOSTIGMINE METHYLSULFATE 1 MG/ML IJ SOLN
INTRAMUSCULAR | Status: DC | PRN
  Administered 2012-08-29: 5 mg via INTRAVENOUS

## 2012-08-29 MED ORDER — ACETAMINOPHEN 10 MG/ML IV SOLN
INTRAVENOUS | Status: DC | PRN
  Administered 2012-08-29: 1000 mg via INTRAVENOUS

## 2012-08-29 MED ORDER — LIDOCAINE HCL (CARDIAC) 20 MG/ML IV SOLN
INTRAVENOUS | Status: DC | PRN
  Administered 2012-08-29: 30 mg via INTRAVENOUS

## 2012-08-29 MED ORDER — LACTATED RINGERS IR SOLN
Status: DC | PRN
  Administered 2012-08-29: 3000 mL

## 2012-08-29 MED ORDER — FENTANYL CITRATE 0.05 MG/ML IJ SOLN
INTRAMUSCULAR | Status: DC | PRN
  Administered 2012-08-29: 100 ug via INTRAVENOUS
  Administered 2012-08-29 (×3): 50 ug via INTRAVENOUS
  Administered 2012-08-29 (×2): 25 ug via INTRAVENOUS
  Administered 2012-08-29: 50 ug via INTRAVENOUS
  Administered 2012-08-29: 100 ug via INTRAVENOUS

## 2012-08-29 MED ORDER — MEPERIDINE HCL 50 MG/ML IJ SOLN: 6.2500 mg | INTRAMUSCULAR | Status: DC | PRN

## 2012-08-29 MED ORDER — KETAMINE HCL 10 MG/ML IJ SOLN
INTRAMUSCULAR | Status: DC | PRN
  Administered 2012-08-29 (×2): 10 mg via INTRAVENOUS

## 2012-08-29 MED ORDER — ONDANSETRON HCL 4 MG/2ML IJ SOLN
INTRAMUSCULAR | Status: DC | PRN
  Administered 2012-08-29 (×2): 2 mg via INTRAVENOUS

## 2012-08-29 MED ORDER — LACTATED RINGERS IV SOLN
INTRAVENOUS | Status: DC | PRN
  Administered 2012-08-29 (×2): via INTRAVENOUS

## 2012-08-29 MED ORDER — OXYCODONE-ACETAMINOPHEN 5-325 MG/5ML PO SOLN
5.0000 mL | ORAL | Status: DC | PRN
  Administered 2012-08-30 – 2012-08-31 (×2): 10 mL via ORAL
  Filled 2012-08-29 (×2): qty 10

## 2012-08-29 MED ORDER — HYDROMORPHONE HCL PF 1 MG/ML IJ SOLN: 0.2500 mg | INTRAMUSCULAR | Status: DC | PRN

## 2012-08-29 MED ORDER — BUPIVACAINE LIPOSOME 1.3 % IJ SUSP
20.0000 mL | Freq: Once | INTRAMUSCULAR | Status: AC
  Administered 2012-08-29: 20 mL
  Filled 2012-08-29: qty 20

## 2012-08-29 MED ORDER — HEPARIN SODIUM (PORCINE) 5000 UNIT/ML IJ SOLN
5000.0000 [IU] | Freq: Three times a day (TID) | INTRAMUSCULAR | Status: DC
  Administered 2012-08-29 – 2012-09-01 (×9): 5000 [IU] via SUBCUTANEOUS
  Filled 2012-08-29 (×12): qty 1

## 2012-08-29 MED ORDER — HYDROMORPHONE HCL PF 1 MG/ML IJ SOLN
INTRAMUSCULAR | Status: DC | PRN
  Administered 2012-08-29 (×4): 0.5 mg via INTRAVENOUS

## 2012-08-29 MED ORDER — CEFOXITIN SODIUM 2 G IV SOLR
2.0000 g | INTRAVENOUS | Status: AC
  Administered 2012-08-29: 2 g via INTRAVENOUS
  Filled 2012-08-29: qty 2

## 2012-08-29 MED ORDER — ACETAMINOPHEN 10 MG/ML IV SOLN: 1000.0000 mg | Freq: Once | INTRAVENOUS | Status: DC | PRN

## 2012-08-29 MED ORDER — CISATRACURIUM BESYLATE (PF) 10 MG/5ML IV SOLN
INTRAVENOUS | Status: DC | PRN
  Administered 2012-08-29: 2 mg via INTRAVENOUS
  Administered 2012-08-29: 10 mg via INTRAVENOUS
  Administered 2012-08-29: 4 mg via INTRAVENOUS

## 2012-08-29 MED ORDER — HEPARIN SODIUM (PORCINE) 5000 UNIT/ML IJ SOLN
5000.0000 [IU] | INTRAMUSCULAR | Status: AC
  Administered 2012-08-29: 5000 [IU] via SUBCUTANEOUS
  Filled 2012-08-29: qty 1

## 2012-08-29 MED ORDER — SUCCINYLCHOLINE CHLORIDE 20 MG/ML IJ SOLN
INTRAMUSCULAR | Status: DC | PRN
  Administered 2012-08-29: 100 mg via INTRAVENOUS

## 2012-08-29 MED ORDER — GLYCOPYRROLATE 0.2 MG/ML IJ SOLN
INTRAMUSCULAR | Status: DC | PRN
  Administered 2012-08-29: 0.6 mg via INTRAVENOUS

## 2012-08-29 MED ORDER — KCL IN DEXTROSE-NACL 20-5-0.45 MEQ/L-%-% IV SOLN
INTRAVENOUS | Status: DC
  Administered 2012-08-29 – 2012-08-30 (×4): via INTRAVENOUS
  Filled 2012-08-29 (×6): qty 1000

## 2012-08-29 MED ORDER — PROMETHAZINE HCL 25 MG/ML IJ SOLN
6.2500 mg | INTRAMUSCULAR | Status: DC | PRN
  Administered 2012-08-29: 12.5 mg via INTRAVENOUS

## 2012-08-29 MED ORDER — ONDANSETRON HCL 4 MG/2ML IJ SOLN
4.0000 mg | INTRAMUSCULAR | Status: DC | PRN
  Administered 2012-08-29 – 2012-08-30 (×4): 4 mg via INTRAVENOUS
  Filled 2012-08-29 (×4): qty 2

## 2012-08-29 MED ORDER — UNJURY CHOCOLATE CLASSIC POWDER: 2.0000 [oz_av] | Freq: Four times a day (QID) | ORAL | Status: DC

## 2012-08-29 MED ORDER — ACETAMINOPHEN 160 MG/5ML PO SOLN: 650.0000 mg | ORAL | Status: DC | PRN

## 2012-08-29 MED ORDER — PROPOFOL 10 MG/ML IV EMUL
INTRAVENOUS | Status: DC | PRN
  Administered 2012-08-29: 250 mg via INTRAVENOUS
  Administered 2012-08-29 (×2): 25 mg via INTRAVENOUS

## 2012-08-29 MED ORDER — MIDAZOLAM HCL 5 MG/5ML IJ SOLN
INTRAMUSCULAR | Status: DC | PRN
  Administered 2012-08-29: 1 mg via INTRAVENOUS

## 2012-08-29 MED ORDER — HYDROMORPHONE HCL PF 1 MG/ML IJ SOLN
0.5000 mg | INTRAMUSCULAR | Status: DC | PRN
  Administered 2012-08-29 – 2012-08-30 (×4): 0.5 mg via INTRAVENOUS
  Filled 2012-08-29 (×4): qty 1

## 2012-08-29 SURGICAL SUPPLY — 65 items
ADH SKN CLS APL DERMABOND .7 (GAUZE/BANDAGES/DRESSINGS)
APL SRG 32X5 SNPLK LF DISP (MISCELLANEOUS) ×2
APPLICATOR COTTON TIP 6IN STRL (MISCELLANEOUS) ×3 IMPLANT
APPLIER CLIP ROT 10 11.4 M/L (STAPLE)
APR CLP MED LRG 11.4X10 (STAPLE)
BAG SPEC RTRVL LRG 6X4 10 (ENDOMECHANICALS)
CABLE HIGH FREQUENCY MONO STRZ (ELECTRODE) ×3 IMPLANT
CANISTER SUCTION 2500CC (MISCELLANEOUS) ×3 IMPLANT
CLIP APPLIE ROT 10 11.4 M/L (STAPLE) IMPLANT
CLOTH BEACON ORANGE TIMEOUT ST (SAFETY) ×3 IMPLANT
COVER SURGICAL LIGHT HANDLE (MISCELLANEOUS) IMPLANT
DERMABOND ADVANCED (GAUZE/BANDAGES/DRESSINGS)
DERMABOND ADVANCED .7 DNX12 (GAUZE/BANDAGES/DRESSINGS) IMPLANT
DEVICE SUTURE ENDOST 10MM (ENDOMECHANICALS) IMPLANT
DEVICE TROCAR PUNCTURE CLOSURE (ENDOMECHANICALS) IMPLANT
DRAIN CHANNEL 19F RND (DRAIN) ×3 IMPLANT
DRAPE CAMERA CLOSED 9X96 (DRAPES) ×3 IMPLANT
DRAPE LAPAROSCOPIC ABDOMINAL (DRAPES) ×3 IMPLANT
ELECT REM PT RETURN 9FT ADLT (ELECTROSURGICAL) ×3
ELECTRODE REM PT RTRN 9FT ADLT (ELECTROSURGICAL) ×2 IMPLANT
EVACUATOR DRAINAGE 10X20 100CC (DRAIN) ×2 IMPLANT
EVACUATOR SILICONE 100CC (DRAIN) ×3
FILTER SMOKE EVAC LAPAROSHD (FILTER) ×3 IMPLANT
GLOVE BIO SURGEON STRL SZ8 (GLOVE) ×6 IMPLANT
GOWN STRL NON-REIN LRG LVL3 (GOWN DISPOSABLE) IMPLANT
GOWN STRL REIN XL XLG (GOWN DISPOSABLE) ×24 IMPLANT
HOVERMATT SINGLE USE (MISCELLANEOUS) ×3 IMPLANT
KIT BASIN OR (CUSTOM PROCEDURE TRAY) ×3 IMPLANT
MARKER SKIN DUAL TIP RULER LAB (MISCELLANEOUS) IMPLANT
NEEDLE SPNL 22GX3.5 QUINCKE BK (NEEDLE) ×3 IMPLANT
NS IRRIG 1000ML POUR BTL (IV SOLUTION) ×3 IMPLANT
PACK UNIVERSAL I (CUSTOM PROCEDURE TRAY) ×3 IMPLANT
PENCIL BUTTON HOLSTER BLD 10FT (ELECTRODE) ×3 IMPLANT
POUCH SPECIMEN RETRIEVAL 10MM (ENDOMECHANICALS) IMPLANT
RELOAD BLUE (STAPLE) ×15 IMPLANT
RELOAD ENDO STITCH (ENDOMECHANICALS) IMPLANT
RELOAD GREEN (STAPLE) ×3 IMPLANT
SCISSORS LAP 5X35 DISP (ENDOMECHANICALS) ×3 IMPLANT
SEALANT SURGICAL APPL DUAL CAN (MISCELLANEOUS) ×3 IMPLANT
SET IRRIG TUBING LAPAROSCOPIC (IRRIGATION / IRRIGATOR) ×3 IMPLANT
SHEARS CURVED HARMONIC AC 45CM (MISCELLANEOUS) ×3 IMPLANT
SLEEVE ADV FIXATION 12X100MM (TROCAR) ×3 IMPLANT
SLEEVE ADV FIXATION 5X100MM (TROCAR) ×6 IMPLANT
SOLUTION ANTI FOG 6CC (MISCELLANEOUS) ×3 IMPLANT
SPONGE DRAIN TRACH 4X4 STRL 2S (GAUZE/BANDAGES/DRESSINGS) ×3 IMPLANT
SPONGE GAUZE 4X4 12PLY (GAUZE/BANDAGES/DRESSINGS) IMPLANT
SPONGE LAP 18X18 X RAY DECT (DISPOSABLE) ×3 IMPLANT
STAPLE ECHEON FLEX 60 POW ENDO (STAPLE) ×3 IMPLANT
SUT ETHILON 2 0 PS N (SUTURE) ×3 IMPLANT
SUT SILK 2 0 (SUTURE) ×3
SUT SILK 2-0 18XBRD TIE 12 (SUTURE) ×2 IMPLANT
SUT VIC AB 4-0 SH 18 (SUTURE) ×3 IMPLANT
SYR 50ML LL SCALE MARK (SYRINGE) ×3 IMPLANT
TRAY FOLEY CATH 14FRSI W/METER (CATHETERS) ×3 IMPLANT
TRAY LAP CHOLE (CUSTOM PROCEDURE TRAY) ×3 IMPLANT
TROCAR ADV FIXATION 12X100MM (TROCAR) ×3 IMPLANT
TROCAR ADV FIXATION 5X100MM (TROCAR) ×3 IMPLANT
TROCAR BALLN 12MMX100 BLUNT (TROCAR) IMPLANT
TROCAR BLADELESS 15MM (ENDOMECHANICALS) IMPLANT
TROCAR BLADELESS OPT 12M 100M (ENDOMECHANICALS) IMPLANT
TROCAR BLADELESS OPT 5 100 (ENDOMECHANICALS) ×6 IMPLANT
TROCAR XCEL NON-BLD 5MMX100MML (ENDOMECHANICALS) IMPLANT
TUBING CONNECTING 10 (TUBING) ×3 IMPLANT
TUBING ENDO SMARTCAP (MISCELLANEOUS) ×3 IMPLANT
TUBING FILTER THERMOFLATOR (ELECTROSURGICAL) ×3 IMPLANT

## 2012-08-29 NOTE — H&P (View-Only) (Signed)
Chief Complaint:  Morbid obesity BMI 58  History of Present Illness:  Crystal Garza is an 42 y.o. female is set for Gastric sleeve this coming Monday. She had previous gist tumor removed. She is also at for this surgery. She has no further questions. She has been very compliant and has lost weight down to 374 which puts her BMI now at 58.  She is aware of the risk benefits of sleeve gastrectomy and we will proceed with this operation on Monday  Past Medical History  Diagnosis Date  . Anemia     iron def  . Hypertension   . Morbid obesity   . GERD (gastroesophageal reflux disease)   . Anxiety   . Depression   . Migraine   . Back pain   . Chronic knee pain   . Arthritis     left knee   . Hx of laparoscopic gastric banding   . Anginal pain     Past Surgical History  Procedure Laterality Date  . Abdominal hysterectomy    . Knee arthroscopy    . Cesarean section  05/1991  . Breath tek h pylori  07/28/2011    Procedure: BREATH TEK H PYLORI;  Surgeon: Valarie Merino, MD;  Location: [facility];  Service: General;  Laterality: N/A;  to be done at 745  . Esophageal biopsy  02/16/2012    Procedure: BIOPSY;  Surgeon: Valarie Merino, MD;  [facility]: [facility];  Service: General;;  biopsy of mass x 2  . Eus  03/03/2012    Procedure: UPPER ENDOSCOPIC ULTRASOUND (EUS) LINEAR;  Surgeon: Rachael Fee, MD;  Location: [facility];  Service: Endoscopy;  Laterality: N/A;  . Laparoscopic gastric banding    . Tubal ligation    . Laparoscopic gastrectomy  04/08/2012    Procedure: LAPAROSCOPIC GASTRECTOMY;  Surgeon: Valarie Merino, MD;  Location: [facility];  Service: General;;  removal of GIST tumor of stomach    Current Outpatient Prescriptions  Medication Sig Dispense Refill  . ALPRAZolam (XANAX) 1 MG tablet Take 1 mg by mouth 2 (two) times daily as needed for anxiety.       . divalproex (DEPAKOTE SPRINKLE) 125 MG capsule Take 500-1,000 mg by mouth 2 (two) times daily. Sprinkle kind of  medication - patient takes 8 caps. every nite and then alternating 4 caps. and 8 caps. every other day in the am ( patient states there are 125 mg in each packet )      . fluticasone (FLONASE) 50 MCG/ACT nasal spray Place 2 sprays into the nose daily.      Marland Kitchen lamoTRIgine (LAMICTAL) 100 MG tablet Take 100 mg by mouth at bedtime.      Marland Kitchen lisinopril-hydrochlorothiazide (PRINZIDE,ZESTORETIC) 20-25 MG per tablet Take 1 tablet by mouth at bedtime.      Marland Kitchen loratadine (CLARITIN) 10 MG tablet Take 1 tablet (10 mg total) by mouth daily.  30 tablet  11  . meloxicam (MOBIC) 15 MG tablet Take 15 mg by mouth daily as needed for pain.      Marland Kitchen nystatin cream (MYCOSTATIN) Apply 1 application topically 2 (two) times daily. Applies under breasts      . omeprazole (PRILOSEC) 40 MG capsule Take 40 mg by mouth daily.      Marland Kitchen oxyCODONE-acetaminophen (ROXICET) 5-325 MG per tablet Take 1 tablet by mouth every 8 (eight) hours as needed for pain.  60 tablet  0  . promethazine (PHENERGAN) 25 MG tablet Take 1 tablet (  25 mg total) by mouth every 6 (six) hours as needed for nausea. for nausea/vomiting  20 tablet  2  . tiZANidine (ZANAFLEX) 4 MG tablet Take 1 tablet (4 mg total) by mouth every 6 (six) hours as needed. Muscle spasm  30 tablet  2  . triamcinolone ointment (KENALOG) 0.1 % Apply 1 application topically 2 (two) times daily as needed. Applies to left arm       No current facility-administered medications for this visit.   Codeine Family History  Problem Relation Age of Onset  . Diabetes Mother   . Cancer Father     brain tumor, colon  . Diabetes Father   . Heart disease Father   . Heart disease Paternal Uncle   . Diabetes Paternal Uncle   . Diabetes Maternal Grandmother   . Heart disease Paternal Grandmother   . Heart disease Paternal Grandfather   . Diabetes Maternal Aunt   . Diabetes Maternal Uncle   . Diabetes Paternal Aunt   . Diabetes Maternal Grandfather    Social History:   reports that she has never  smoked. She has never used smokeless tobacco. She reports that  drinks alcohol. She reports that she does not use illicit drugs.   REVIEW OF SYSTEMS - PERTINENT POSITIVES ONLY: Nothing new  Physical Exam:   Blood pressure 128/84, pulse 88, temperature 97.6 F (36.4 C), resp. rate 22, height 5\' 7"  (1.702 m), weight 374 lb (169.645 kg). Body mass index is 58.56 kg/(m^2).  Gen:  WDWN AAF NAD  Neurological: Alert and oriented to person, place, and time. Motor and sensory function is grossly intact  Head: Normocephalic and atraumatic.  Eyes: Conjunctivae are normal. Pupils are equal, round, and reactive to light. No scleral icterus.  Neck: Normal range of motion. Neck supple. No tracheal deviation or thyromegaly present.  Cardiovascular:  SR without murmurs or gallops.  No carotid bruits Respiratory: Effort normal.  No respiratory distress. No chest wall tenderness. Breath sounds normal.  No wheezes, rales or rhonchi.  Abdomen:  Incisions have healed.   GU: Musculoskeletal: Normal range of motion. Extremities are nontender. No cyanosis, edema or clubbing noted Lymphadenopathy: No cervical, preauricular, postauricular or axillary adenopathy is present Skin: Skin is warm and dry. No rash noted. No diaphoresis. No erythema. No pallor. Pscyh: Normal mood and affect. Behavior is normal. Judgment and thought content normal.   LABORATORY RESULTS: No results found for this or any previous visit (from the past 48 hour(s)).  RADIOLOGY RESULTS: No results found.  Problem List: Patient Active Problem List  Diagnosis  . Morbid obesity-BMI 62  . DEPRESSION, MAJOR, RECURRENT  . MIGRAINE, CHRONIC  . HYPERTENSION, BENIGN SYSTEMIC  . RHINITIS, ALLERGIC  . GASTROESOPHAGEAL REFLUX, NO ESOPHAGITIS  . OSTEOARTHRITIS, KNEES, BILATERAL  . Iron deficiency anemia  . Anxiety  . Back pain  . Eczematous dermatitis  . Arm paresthesia, left  . Nocturia  . Nephrolithiasis  . Need for prophylactic  vaccination and inoculation against influenza  . Skin yeast infection  . Peripheral edema  . Encounter for annual health examination  . Bipolar disorder  . 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  . Rotator cuff syndrome of left shoulder  . Dysuria    Assessment & Plan: Morbid obesity with history of GIST tumor removal.  Plan sleeve gastrectomy on Monday.      [name], MD, [facility] [phone] beeper [phone]  08/25/2012 12:50  PM

## 2012-08-29 NOTE — Interval H&P Note (Signed)
History and Physical Interval Note:  08/29/2012 7:15 AM  Crystal Garza  has presented today for surgery, with the diagnosis of morbid obesity  The various methods of treatment have been discussed with the patient and family. After consideration of risks, benefits and other options for treatment, the patient has consented to  Procedure(s) with comments: LAPAROSCOPIC GASTRIC SLEEVE RESECTION (N/A) - Sleeve Gastrectomy ESOPHAGOGASTRODUODENOSCOPY (EGD) (N/A) as a surgical intervention .  The patient's history has been reviewed, patient examined, no change in status, stable for surgery.  I have reviewed the patient's chart and labs.  Questions were answered to the patient's satisfaction.     Doctor Sheahan B

## 2012-08-29 NOTE — Transfer of Care (Signed)
Immediate Anesthesia Transfer of Care Note  Patient: Crystal Garza  Procedure(s) Performed: Procedure(s) with comments: LAPAROSCOPIC GASTRIC SLEEVE RESECTION (N/A) - Sleeve Gastrectomy ESOPHAGOGASTRODUODENOSCOPY (EGD) (N/A)  Patient Location: PACU  Anesthesia Type:General  Level of Consciousness: awake, alert , oriented and patient cooperative  Airway & Oxygen Therapy: Patient connected to face mask oxygen  Post-op Assessment: Report given to PACU RN and Post -op Vital signs reviewed and stable  Post vital signs: stable  Complications: No apparent anesthesia complications

## 2012-08-29 NOTE — Op Note (Signed)
Surgeon: Wenda Low, MD, FACS  Asst:  Lodema Pilot, DO  Anes:  GET  Procedure: Laparoscopic sleeve gastrectomy  Diagnosis: Morbid obesity  Complications: None noted   EBL:   15 cc  Description of Procedure:  The patient was Taken to or 1 and given general anesthesia. She had previously undergone laparoscopy for possible lap band placement was found to have a mass at her GE junction. 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   To move forward bariatric surgery we waited 4 months and bring her back at this time for laparoscopic sleeve gastrectomy.   Access to the abdomen was achieved to the left upper quadrant initially getting into some old adhesions I subsequently went more medial and got in and inflated the abdomen. This enabled me to placed trocar in the lower abdomen through which a camera was placed and then was able to put another port on the patient's right side for enteral lysis. Sharp dissection as well as use of the harmonic scalpel no me to free the intra-abdominal wall from adhesions.  Foregut was exposed by first placing a Nathanson retractor and then doing sharp dissection takedown of previous staple line where was adherent to the liver. This was carried up to the diaphragm.  Next I measured about 5 cm from the pylorus and took down the greater curvature vessels beginning near and extending all way up to the left crus.  I then inserted a green 6 cm echelon stapler slight to the right of midline and stapled end toward the inside sure which identified. I gave [name] plenty room from the inside sure to the staple line. Next we passed a 36 bougie to the distal antrum. With that in place and then began working my way out the stomach with the blue loads using echelon: Up to the area of the previous biopsy. I stayed lateral to that as I completed the transection of the stomach.  Dr. Darylene Price performed endoscopy and with insufflation  no leak was seen. The scope passed easily to the antrum. The staple lines appeared straight.  The stomach was retrieved through the 15 mm port placed in the midline and then the wounds were injected. A Jackson-Pratt drain was placed up along the staple and brought through a 5 mm port site.  The incisions were closed 4-0 Vicryl and with Dermabond.  [name], MD, [facility], [place] [phone]

## 2012-08-29 NOTE — Anesthesia Postprocedure Evaluation (Signed)
Anesthesia Post Note  Patient: Crystal Garza  Procedure(s) Performed: Procedure(s) (LRB): LAPAROSCOPIC GASTRIC SLEEVE RESECTION (N/A) ESOPHAGOGASTRODUODENOSCOPY (EGD) (N/A)  Anesthesia type: General  Patient location: PACU  Post pain: Pain level controlled  Post assessment: Post-op Vital signs reviewed  Last Vitals: BP 152/70  Pulse 96  Temp(Src) 36.6 C (Oral)  Resp 18  Ht 5\' 7"  (1.702 m)  Wt 370 lb (167.831 kg)  BMI 57.94 kg/m2  SpO2 98%  Post vital signs: Reviewed  Level of consciousness: sedated  Complications: No apparent anesthesia complications

## 2012-08-29 NOTE — Anesthesia Preprocedure Evaluation (Addendum)
Anesthesia Evaluation  Patient identified by MRN, date of birth, ID band Patient awake    Reviewed: Allergy & Precautions, H&P , NPO status , Patient's Chart, lab work & pertinent test results  Airway Mallampati: II TM Distance: >3 FB Neck ROM: full    Dental  (+) Dental Advisory Given and Edentulous Upper   Pulmonary neg pulmonary ROS,  breath sounds clear to auscultation  Pulmonary exam normal       Cardiovascular hypertension, Pt. on medications Rhythm:regular Rate:Normal     Neuro/Psych  Headaches, PSYCHIATRIC DISORDERS Anxiety Depression Bipolar Disorder    GI/Hepatic negative GI ROS, Neg liver ROS, GERD-  Medicated and Controlled,  Endo/Other  Morbid obesity  Renal/GU Nephrolithisis      Musculoskeletal   Abdominal (+) + obese,   Peds  Hematology negative hematology ROS (+)   Anesthesia Other Findings   Reproductive/Obstetrics negative OB ROS                         Anesthesia Physical  Anesthesia Plan  ASA: III  Anesthesia Plan: General   Post-op Pain Management:    Induction: Intravenous  Airway Management Planned: Oral ETT  Additional Equipment:   Intra-op Plan:   Post-operative Plan: Extubation in OR  Informed Consent: I have reviewed the patients History and Physical, chart, labs and discussed the procedure including the risks, benefits and alternatives for the proposed anesthesia with the patient or authorized representative who has indicated his/her understanding and acceptance.   Dental Advisory Given  Plan Discussed with: CRNA  Anesthesia Plan Comments:        Anesthesia Quick Evaluation

## 2012-08-30 ENCOUNTER — Observation Stay (HOSPITAL_COMMUNITY): Payer: Medicare Other

## 2012-08-30 ENCOUNTER — Encounter (HOSPITAL_COMMUNITY): Payer: Self-pay | Admitting: Surgery

## 2012-08-30 DIAGNOSIS — Z09 Encounter for follow-up examination after completed treatment for conditions other than malignant neoplasm: Secondary | ICD-10-CM

## 2012-08-30 LAB — CBC WITH DIFFERENTIAL/PLATELET
Basophils Absolute: 0 10*3/uL (ref 0.0–0.1)
Basophils Relative: 0 % (ref 0–1)
Eosinophils Absolute: 0 10*3/uL (ref 0.0–0.7)
Eosinophils Relative: 0 % (ref 0–5)
HCT: 35.8 % — ABNORMAL LOW (ref 36.0–46.0)
Hemoglobin: 11.4 g/dL — ABNORMAL LOW (ref 12.0–15.0)
Lymphocytes Relative: 19 % (ref 12–46)
Lymphs Abs: 3.3 10*3/uL (ref 0.7–4.0)
MCH: 24.8 pg — ABNORMAL LOW (ref 26.0–34.0)
MCHC: 31.8 g/dL (ref 30.0–36.0)
MCV: 78 fL (ref 78.0–100.0)
Monocytes Absolute: 0.9 10*3/uL (ref 0.1–1.0)
Monocytes Relative: 5 % (ref 3–12)
Neutro Abs: 12.9 10*3/uL — ABNORMAL HIGH (ref 1.7–7.7)
Neutrophils Relative %: 75 % (ref 43–77)
Platelets: 469 10*3/uL — ABNORMAL HIGH (ref 150–400)
RBC: 4.59 MIL/uL (ref 3.87–5.11)
RDW: 15.7 % — ABNORMAL HIGH (ref 11.5–15.5)
WBC: 17.2 10*3/uL — ABNORMAL HIGH (ref 4.0–10.5)

## 2012-08-30 LAB — HEMOGLOBIN AND HEMATOCRIT, BLOOD
HCT: 35.6 % — ABNORMAL LOW (ref 36.0–46.0)
Hemoglobin: 11.3 g/dL — ABNORMAL LOW (ref 12.0–15.0)

## 2012-08-30 MED ORDER — IOHEXOL 300 MG/ML  SOLN
50.0000 mL | Freq: Once | INTRAMUSCULAR | Status: AC | PRN
  Administered 2012-08-30: 50 mL via INTRAVENOUS

## 2012-08-30 NOTE — Care Management Note (Signed)
    Page 1 of 1   08/30/2012     11:17:10 AM   CARE MANAGEMENT NOTE 08/30/2012  Patient:  Crystal Garza, Crystal Garza   Account Number:  [id number]  Date Initiated:  08/30/2012  Documentation initiated by:  Lorenda Ishihara  Subjective/Objective Assessment:   42 yo female admitted s/p lap sleeve gastrectomy. PTA lived at home with family.     Action/Plan:   Home when stable   Anticipated DC Date:  08/31/2012   Anticipated DC Plan:  HOME/SELF CARE      DC Planning Services  CM consult      Choice offered to / List presented to:             Status of service:  Completed, signed off Medicare Important Message given?   (If response is "NO", the following Medicare IM given date fields will be blank) Date Medicare IM given:   Date Additional Medicare IM given:    Discharge Disposition:  HOME/SELF CARE  Per UR Regulation:  Reviewed for med. necessity/level of care/duration of stay  If discussed at Long Length of Stay Meetings, dates discussed:    Comments:

## 2012-08-30 NOTE — Progress Notes (Signed)
Utilization review completed.  

## 2012-08-30 NOTE — Progress Notes (Signed)
Patient ID: Crystal Garza, female   DOB: [birth year], 42 y.o.   MRN: [id number] [facility] Progress Note:   1 Day Post-Op  Subjective: Mental status is clear Objective: Vital signs in last 24 hours: Temp:  [97.7 F (36.5 C)-98.6 F (37 C)] 98.6 F (37 C) (04/29 1009) Pulse Rate:  [78-102] 89 (04/29 1009) Resp:  [16-18] 18 (04/29 1009) BP: (128-152)/(58-82) 142/58 mmHg (04/29 1009) SpO2:  [95 %-100 %] 99 % (04/29 1009)  Intake/Output from previous day: 04/28 0701 - 04/29 0700 In: 3236.7 [I.V.:3236.7] Out: 1121 [Urine:975; Drains:121; Blood:25] Intake/Output this shift: Total I/O In: 60 [P.O.:60] Out: 200 [Urine:200]  Physical Exam: Work of breathing is normal.  Minimal pain  Lab Results:  Results for orders placed during the hospital encounter of 08/29/12 (from the past 48 hour(s))  CBC     Status: Abnormal   Collection Time    08/29/12  1:05 PM      Result Value Range   WBC 27.0 (*) 4.0 - 10.5 K/uL   Comment: WHITE COUNT CONFIRMED ON SMEAR   RBC 4.96  3.87 - 5.11 MIL/uL   Hemoglobin 12.3  12.0 - 15.0 g/dL   HCT 29.5  28.4 - 13.2 %   MCV 78.6  78.0 - 100.0 fL   MCH 24.8 (*) 26.0 - 34.0 pg   MCHC 31.5  30.0 - 36.0 g/dL   RDW 44.0 (*) 10.2 - 72.5 %   Platelets 436 (*) 150 - 400 K/uL  CREATININE, SERUM     Status: Abnormal   Collection Time    08/29/12  1:05 PM      Result Value Range   Creatinine, Ser 0.87  0.50 - 1.10 mg/dL   GFR calc non Af Amer 80 (*) >90 mL/min   GFR calc Af Amer >90  >90 mL/min   Comment:            The eGFR has been calculated     using the CKD EPI equation.     This calculation has not been     validated in all clinical     situations.     eGFR's persistently     <90 mL/min signify     possible Chronic Kidney Disease.  HEMOGLOBIN AND HEMATOCRIT, BLOOD     Status: Abnormal   Collection Time    08/29/12  3:45 PM      Result Value Range   Hemoglobin 11.9 (*) 12.0 - 15.0 g/dL   HCT 36.6  44.0 - 34.7 %  CBC WITH DIFFERENTIAL      Status: Abnormal   Collection Time    08/30/12  5:05 AM      Result Value Range   WBC 17.2 (*) 4.0 - 10.5 K/uL   RBC 4.59  3.87 - 5.11 MIL/uL   Hemoglobin 11.4 (*) 12.0 - 15.0 g/dL   HCT 42.5 (*) 95.6 - 38.7 %   MCV 78.0  78.0 - 100.0 fL   MCH 24.8 (*) 26.0 - 34.0 pg   MCHC 31.8  30.0 - 36.0 g/dL   RDW 56.4 (*) 33.2 - 95.1 %   Platelets 469 (*) 150 - 400 K/uL   Neutrophils Relative 75  43 - 77 %   Neutro Abs 12.9 (*) 1.7 - 7.7 K/uL   Lymphocytes Relative 19  12 - 46 %   Lymphs Abs 3.3  0.7 - 4.0 K/uL   Monocytes Relative 5  3 - 12 %   Monocytes Absolute  0.9  0.1 - 1.0 K/uL   Eosinophils Relative 0  0 - 5 %   Eosinophils Absolute 0.0  0.0 - 0.7 K/uL   Basophils Relative 0  0 - 1 %   Basophils Absolute 0.0  0.0 - 0.1 K/uL    Radiology/Results: Dg Ugi W/water Sol Cm  08/30/2012  *RADIOLOGY REPORT*  Clinical Data:Post gastric sleeve procedure.  History of gastrointestinal stromal tumor.  ESOPHAGUS/BARIUM SWALLOW/TABLET STUDY  Contrast:  22 ml Omnipaque-300.  Fluoroscopy Time: 1 minute and 41 seconds.  Comparison: 02/16/2012 CT.  Findings: Scout view reveals surgical drain left upper quadrant.  Upon ingestion of water-soluble contrast, there is initial holdup of forward flow of ingested contrast at the proximal surgical site where there is slightly asymmetric thickening. This may be related to postoperative edema.  Small caliber reconstructed the stomach. No leak identified.  Limited for evaluation for the presence of recurrent tumor.  IMPRESSION: Upon ingestion of water-soluble contrast, there is initial holdup of forward flow of ingested contrast at the proximal surgical site where there is slightly asymmetric thickening. This may be related to postoperative edema.  Small caliber reconstructed the stomach.  No leak identified.  This is a call report.   Original Report Authenticated By: Lacy Duverney, M.D.     Anti-infectives: Anti-infectives   Start     Dose/Rate Route Frequency  Ordered Stop   08/29/12 0550  cefOXitin (MEFOXIN) 2 g in dextrose 5 % 50 mL IVPB     2 g 100 mL/hr over 30 Minutes Intravenous On call to O.R. 08/29/12 0550 08/29/12 0730      Assessment/Plan: Problem List: Patient Active Problem List   Diagnosis Date Noted  . Dysuria 05/31/2012  . Rotator cuff syndrome of left shoulder 04/22/2012  . 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 02/17/2012  . Bipolar disorder 01/19/2012  . Encounter for annual health examination 11/04/2011  . Peripheral edema 08/27/2011  . Skin yeast infection 08/25/2011  . Need for prophylactic vaccination and inoculation against influenza 06/02/2011  . Nephrolithiasis 02/20/2011  . Nocturia 01/22/2011  . Arm paresthesia, left 12/30/2010  . Back pain 10/22/2010  . Eczematous dermatitis 10/22/2010  . Iron deficiency anemia 06/27/2010  . Anxiety 06/27/2010  . MIGRAINE, CHRONIC 09/12/2009  . Morbid obesity-BMI 62 10/30/2008  . OSTEOARTHRITIS, KNEES, BILATERAL 07/05/2007  . DEPRESSION, MAJOR, RECURRENT 07/01/2006  . HYPERTENSION, BENIGN SYSTEMIC 07/01/2006  . RHINITIS, ALLERGIC 07/01/2006  . GASTROESOPHAGEAL REFLUX, NO ESOPHAGITIS 07/01/2006    Doing well postop.  Swallow OK.  Begin PD one diet 1 Day Post-Op    LOS: 1 day   [name], MD, [facility] [phone] beeper [phone]  08/30/2012 1:28 PM

## 2012-08-30 NOTE — Progress Notes (Signed)
*  PRELIMINARY RESULTS* Vascular Ultrasound Lower extremity venous duplex has been completed.  Preliminary findings: Bilateral:  No evidence of DVT.   Farrel Demark, RDMS, RVT  08/30/2012, 9:19 AM

## 2012-08-31 LAB — CBC WITH DIFFERENTIAL/PLATELET
Basophils Absolute: 0 10*3/uL (ref 0.0–0.1)
Basophils Relative: 0 % (ref 0–1)
Eosinophils Absolute: 0.1 10*3/uL (ref 0.0–0.7)
Eosinophils Relative: 1 % (ref 0–5)
HCT: 34.3 % — ABNORMAL LOW (ref 36.0–46.0)
Hemoglobin: 10.6 g/dL — ABNORMAL LOW (ref 12.0–15.0)
Lymphocytes Relative: 23 % (ref 12–46)
Lymphs Abs: 3.2 10*3/uL (ref 0.7–4.0)
MCH: 23.9 pg — ABNORMAL LOW (ref 26.0–34.0)
MCHC: 30.9 g/dL (ref 30.0–36.0)
MCV: 77.4 fL — ABNORMAL LOW (ref 78.0–100.0)
Monocytes Absolute: 0.9 10*3/uL (ref 0.1–1.0)
Monocytes Relative: 7 % (ref 3–12)
Neutro Abs: 9.4 10*3/uL — ABNORMAL HIGH (ref 1.7–7.7)
Neutrophils Relative %: 69 % (ref 43–77)
Platelets: 449 10*3/uL — ABNORMAL HIGH (ref 150–400)
RBC: 4.43 MIL/uL (ref 3.87–5.11)
RDW: 15.7 % — ABNORMAL HIGH (ref 11.5–15.5)
WBC: 13.6 10*3/uL — ABNORMAL HIGH (ref 4.0–10.5)

## 2012-08-31 NOTE — Progress Notes (Signed)
Verbal order from MD Daphine Deutscher that since IV infiltrated that we can leave the IV out Stanford Breed RN 08-31-2012 15:33pm

## 2012-08-31 NOTE — Progress Notes (Signed)
Patient ID: Crystal Garza, female   DOB: [birth year], 42 y.o.   MRN: [id number] [facility] Progress Note:   2 Days Post-Op  Subjective: Mental status is clear Objective: Vital signs in last 24 hours: Temp:  [97.9 F (36.6 C)-98.6 F (37 C)] 98.6 F (37 C) (04/30 1000) Pulse Rate:  [83-91] 88 (04/30 1000) Resp:  [18-20] 18 (04/30 1000) BP: (118-143)/(46-85) 131/49 mmHg (04/30 1000) SpO2:  [96 %-100 %] 100 % (04/30 1000)  Intake/Output from previous day: 04/29 0701 - 04/30 0700 In: 2820 [P.O.:420; I.V.:2400] Out: 2430 [Urine:2350; Drains:80] Intake/Output this shift: Total I/O In: -  Out: 310 [Urine:300; Drains:10]  Physical Exam: Work of breathing is normal.  JP serosanguinous.  No abdominal pain  Lab Results:  Results for orders placed during the hospital encounter of 08/29/12 (from the past 48 hour(s))  CBC     Status: Abnormal   Collection Time    08/29/12  1:05 PM      Result Value Range   WBC 27.0 (*) 4.0 - 10.5 K/uL   Comment: WHITE COUNT CONFIRMED ON SMEAR   RBC 4.96  3.87 - 5.11 MIL/uL   Hemoglobin 12.3  12.0 - 15.0 g/dL   HCT 40.9  81.1 - 91.4 %   MCV 78.6  78.0 - 100.0 fL   MCH 24.8 (*) 26.0 - 34.0 pg   MCHC 31.5  30.0 - 36.0 g/dL   RDW 78.2 (*) 95.6 - 21.3 %   Platelets 436 (*) 150 - 400 K/uL  CREATININE, SERUM     Status: Abnormal   Collection Time    08/29/12  1:05 PM      Result Value Range   Creatinine, Ser 0.87  0.50 - 1.10 mg/dL   GFR calc non Af Amer 80 (*) >90 mL/min   GFR calc Af Amer >90  >90 mL/min   Comment:            The eGFR has been calculated     using the CKD EPI equation.     This calculation has not been     validated in all clinical     situations.     eGFR's persistently     <90 mL/min signify     possible Chronic Kidney Disease.  HEMOGLOBIN AND HEMATOCRIT, BLOOD     Status: Abnormal   Collection Time    08/29/12  3:45 PM      Result Value Range   Hemoglobin 11.9 (*) 12.0 - 15.0 g/dL   HCT 08.6  57.8 - 46.9 %   CBC WITH DIFFERENTIAL     Status: Abnormal   Collection Time    08/30/12  5:05 AM      Result Value Range   WBC 17.2 (*) 4.0 - 10.5 K/uL   RBC 4.59  3.87 - 5.11 MIL/uL   Hemoglobin 11.4 (*) 12.0 - 15.0 g/dL   HCT 62.9 (*) 52.8 - 41.3 %   MCV 78.0  78.0 - 100.0 fL   MCH 24.8 (*) 26.0 - 34.0 pg   MCHC 31.8  30.0 - 36.0 g/dL   RDW 24.4 (*) 01.0 - 27.2 %   Platelets 469 (*) 150 - 400 K/uL   Neutrophils Relative 75  43 - 77 %   Neutro Abs 12.9 (*) 1.7 - 7.7 K/uL   Lymphocytes Relative 19  12 - 46 %   Lymphs Abs 3.3  0.7 - 4.0 K/uL   Monocytes Relative 5  3 - 12 %  Monocytes Absolute 0.9  0.1 - 1.0 K/uL   Eosinophils Relative 0  0 - 5 %   Eosinophils Absolute 0.0  0.0 - 0.7 K/uL   Basophils Relative 0  0 - 1 %   Basophils Absolute 0.0  0.0 - 0.1 K/uL  HEMOGLOBIN AND HEMATOCRIT, BLOOD     Status: Abnormal   Collection Time    08/30/12  3:53 PM      Result Value Range   Hemoglobin 11.3 (*) 12.0 - 15.0 g/dL   HCT 14.7 (*) 82.9 - 56.2 %  CBC WITH DIFFERENTIAL     Status: Abnormal   Collection Time    08/31/12 12:08 AM      Result Value Range   WBC 13.6 (*) 4.0 - 10.5 K/uL   RBC 4.43  3.87 - 5.11 MIL/uL   Hemoglobin 10.6 (*) 12.0 - 15.0 g/dL   HCT 13.0 (*) 86.5 - 78.4 %   MCV 77.4 (*) 78.0 - 100.0 fL   MCH 23.9 (*) 26.0 - 34.0 pg   MCHC 30.9  30.0 - 36.0 g/dL   RDW 69.6 (*) 29.5 - 28.4 %   Platelets 449 (*) 150 - 400 K/uL   Neutrophils Relative 69  43 - 77 %   Neutro Abs 9.4 (*) 1.7 - 7.7 K/uL   Lymphocytes Relative 23  12 - 46 %   Lymphs Abs 3.2  0.7 - 4.0 K/uL   Monocytes Relative 7  3 - 12 %   Monocytes Absolute 0.9  0.1 - 1.0 K/uL   Eosinophils Relative 1  0 - 5 %   Eosinophils Absolute 0.1  0.0 - 0.7 K/uL   Basophils Relative 0  0 - 1 %   Basophils Absolute 0.0  0.0 - 0.1 K/uL    Radiology/Results: Dg Ugi W/water Sol Cm  08/30/2012  *RADIOLOGY REPORT*  Clinical Data:Post gastric sleeve procedure.  History of gastrointestinal stromal tumor.  ESOPHAGUS/BARIUM  SWALLOW/TABLET STUDY  Contrast:  22 ml Omnipaque-300.  Fluoroscopy Time: 1 minute and 41 seconds.  Comparison: 02/16/2012 CT.  Findings: Scout view reveals surgical drain left upper quadrant.  Upon ingestion of water-soluble contrast, there is initial holdup of forward flow of ingested contrast at the proximal surgical site where there is slightly asymmetric thickening. This may be related to postoperative edema.  Small caliber reconstructed the stomach. No leak identified.  Limited for evaluation for the presence of recurrent tumor.  IMPRESSION: Upon ingestion of water-soluble contrast, there is initial holdup of forward flow of ingested contrast at the proximal surgical site where there is slightly asymmetric thickening. This may be related to postoperative edema.  Small caliber reconstructed the stomach.  No leak identified.  This is a call report.   Original Report Authenticated By: Lacy Duverney, M.D.     Anti-infectives: Anti-infectives   Start     Dose/Rate Route Frequency Ordered Stop   08/29/12 0550  cefOXitin (MEFOXIN) 2 g in dextrose 5 % 50 mL IVPB     2 g 100 mL/hr over 30 Minutes Intravenous On call to O.R. 08/29/12 0550 08/29/12 0730      Assessment/Plan: Problem List: Patient Active Problem List   Diagnosis Date Noted  . Dysuria 05/31/2012  . Rotator cuff syndrome of left shoulder 04/22/2012  . 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 02/17/2012  . Bipolar disorder 01/19/2012  . Encounter for annual health examination 11/04/2011  . Peripheral edema 08/27/2011  . Skin yeast  infection 08/25/2011  . Need for prophylactic vaccination and inoculation against influenza 06/02/2011  . Nephrolithiasis 02/20/2011  . Nocturia 01/22/2011  . Arm paresthesia, left 12/30/2010  . Back pain 10/22/2010  . Eczematous dermatitis 10/22/2010  . Iron deficiency anemia 06/27/2010  . Anxiety 06/27/2010  . MIGRAINE, CHRONIC 09/12/2009  .  Morbid obesity-BMI 62 10/30/2008  . OSTEOARTHRITIS, KNEES, BILATERAL 07/05/2007  . DEPRESSION, MAJOR, RECURRENT 07/01/2006  . HYPERTENSION, BENIGN SYSTEMIC 07/01/2006  . RHINITIS, ALLERGIC 07/01/2006  . GASTROESOPHAGEAL REFLUX, NO ESOPHAGITIS 07/01/2006    Advance to bariatric full liquids 2 Days Post-Op    LOS: 2 days   [name], MD, [facility] [phone] beeper [phone]  08/31/2012 10:59 AM

## 2012-09-01 MED ORDER — HYDROCODONE-ACETAMINOPHEN 5-325 MG PO TABS: 1.0000 | ORAL_TABLET | ORAL | Status: DC | PRN

## 2012-09-01 MED ORDER — ENOXAPARIN (LOVENOX) PATIENT EDUCATION KIT
PACK | Freq: Once | Status: AC
  Administered 2012-09-01: 09:00:00
  Filled 2012-09-01: qty 1

## 2012-09-01 NOTE — Discharge Summary (Signed)
Physician Discharge Summary  Patient ID: Crystal Garza MRN: [id number] DOB/AGE: 05-08-1970 42 y.o.  Admit date: 08/29/2012 Discharge date: 09/01/2012  Admission Diagnoses:  Morbid obesity  Discharge Diagnoses:  Same post sleeve gastrectomy  Active Problems:   * No active hospital problems. *   Surgery:  Laparoscopic sleeve gastrectomy  Discharged Condition: improved  Hospital Course:   Had surgery.  Taken to [facility] postop.  Nausea resolved.  Advanced to liquid diet.  Ready for discharge.  JP removed prior to discharge.   Consults: none  Significant Diagnostic Studies: UGI    Discharge Exam: Blood pressure 103/50, pulse 86, temperature 97.8 F (36.6 C), temperature source Oral, resp. rate 18, height 5\' 7"  (1.702 m), weight 370 lb (167.831 kg), SpO2 100.00%. JP serosanguinous.  Will keep on 2 weeks of Lovenox postop  Disposition: 01-Home or Self Care  Discharge Orders   Future Appointments Provider Department Dept Phone   09/13/2012 4:00 PM [facility] Post-Op Class [facility] [phone]   09/16/2012 1:30 PM Valarie Merino, MD [facility], [place] [phone]   Future Orders Complete By Expires     Discharge instructions  As directed     Comments:      Follow bariatric diet orders    Increase activity slowly  As directed     No wound care  As directed         Medication List    TAKE these medications       ALPRAZolam 1 MG tablet  Commonly known as:  XANAX  Take 1 mg by mouth 2 (two) times daily as needed for anxiety.     divalproex 125 MG capsule  Commonly known as:  DEPAKOTE SPRINKLE  Take 500-1,000 mg by mouth 2 (two) times daily. Sprinkle kind of medication - patient takes 8 caps. every nite and then alternating 4 caps. and 8 caps. every other day in the am ( patient states there are 125 mg in each packet )     fluticasone 50 MCG/ACT nasal spray  Commonly known as:  FLONASE  Place 2 sprays into the nose  daily.     HYDROcodone-acetaminophen 5-325 MG per tablet  Commonly known as:  NORCO/VICODIN  Take 1 tablet by mouth every 4 (four) hours as needed for pain.     lamoTRIgine 100 MG tablet  Commonly known as:  LAMICTAL  Take 100 mg by mouth at bedtime.     lisinopril-hydrochlorothiazide 20-25 MG per tablet  Commonly known as:  PRINZIDE,ZESTORETIC  Take 1 tablet by mouth at bedtime.     loratadine 10 MG tablet  Commonly known as:  CLARITIN  Take 1 tablet (10 mg total) by mouth daily.     meloxicam 15 MG tablet  Commonly known as:  MOBIC  Take 15 mg by mouth daily as needed for pain.     nystatin cream  Commonly known as:  MYCOSTATIN  Apply 1 application topically 2 (two) times daily. Applies under breasts     omeprazole 40 MG capsule  Commonly known as:  PRILOSEC  Take 40 mg by mouth daily.     oxyCODONE-acetaminophen 5-325 MG per tablet  Commonly known as:  ROXICET  Take 1 tablet by mouth every 8 (eight) hours as needed for pain.     promethazine 25 MG tablet  Commonly known as:  PHENERGAN  Take 1 tablet (25 mg total) by mouth every 6 (six) hours as needed for nausea. for nausea/vomiting  tiZANidine 4 MG tablet  Commonly known as:  ZANAFLEX  Take 1 tablet (4 mg total) by mouth every 6 (six) hours as needed. Muscle spasm     triamcinolone ointment 0.1 %  Commonly known as:  KENALOG  Apply 1 application topically 2 (two) times daily as needed. Applies to left arm           Follow-up Information   Follow up with Valarie Merino, MD.   Contact information:   [address] [place] [address] [phone]       Signed: Valarie Merino 09/01/2012, 8:28 AM

## 2012-09-01 NOTE — Progress Notes (Signed)
Patient alert and oriented, pain is controlled. Patient is tolerating fluids, plan to advance to protein shake today.  Reviewed Gastric sleeve discharge instructions with patient and patient is able to articulate understanding.  GASTRIC BYPASS / SLEEVE  Home Care Instructions  These instructions are to help you care for yourself when you go home.  Call: If you have any problems.   Call [phone] and ask for the surgeon on call   If you need immediate assistance come to the ER at [facility]. Tell the ER staff that you are a new post-op gastric bypass or gastric sleeve patient   Signs and symptoms to report:   Severe vomiting or nausea o If you cannot handle clear liquids for longer than 1 day, call your surgeon    Abdominal pain which does not get better after taking your pain medication   Fever greater than 100.4 F and chills   Heart rate over 100 beats a minute   Trouble breathing   Chest pain    Redness, swelling, drainage, or foul odor at incision (surgical) sites    If your incisions open or pull apart   Swelling or pain in calf (lower leg)   Diarrhea (Loose bowel movements that happen often), frequent watery, uncontrolled bowel movements   Constipation, (no bowel movements for 3 days) if this happens:  o Take Milk of Magnesia, 2 tablespoons by mouth, 3 times a day for 2 days if needed o Stop taking Milk of Magnesia once you have had a bowel movement o Call your doctor if constipation continues Or o Take Miralax  (instead of Milk of Magnesia) following the label instructions o Stop taking Miralax once you have had a bowel movement o Call your doctor if constipation continues   Anything you think is "abnormal for you"   Normal side effects after surgery:   Unable to sleep at night or unable to concentrate   Irritability   Being tearful (crying) or depressed These are common complaints, possibly related to your anesthesia, stress of surgery and change in lifestyle, that  usually go away a few weeks after surgery.  If these feelings continue, call your medical doctor.  Wound Care: You may have surgical glue, steri-strips, or staples over your incisions after surgery   Surgical glue:  Looks like a clear film over your incisions and will wear off a little at a time   Steri-strips : Adhesive strips of tape over your incisions. You may notice a yellowish color on the skin under the steri-strips. This is used to make the   steri-strips stick better. Do not pull the steri-strips off - let them fall off   Staples: Staples may be removed before you leave the hospital o If you go home with staples, call [facility] at for an appointment with your surgeon's nurse to have staples removed 10 days after surgery, [phone]   Showering: You may shower two (2) days after your surgery unless your surgeon tells you differently o Wash gently around incisions with warm soapy water, rinse well, and gently pat dry  o If you have a drain (tube from your incision), you may need someone to hold this while you shower  o No tub baths until staples are removed and incisions are healed     Medications:   Medications should be liquid or crushed if larger than the size of a dime   Extended release pills (medication that releases a little bit at a time  through the day) should not be crushed   Depending on the size and number of medications you take, you may need to space (take a few throughout the day)/change the time you take your medications so that you do not over-fill your pouch (smaller stomach)   Make sure you follow-up with your primary care physician to make medication changes needed during rapid weight loss and life-style changes   If you have diabetes, follow up with the doctor that orders your diabetes medication(s) within one week after surgery and check your blood sugar regularly.   Do not drive while taking narcotics (pain medications)   Do not take acetaminophen  (Tylenol) and Roxicet or Lortab Elixir at the same time since these pain medications contain acetaminophen  Diet:                    First 2 Weeks  You will see the nutritionist about two (2) weeks after your surgery. The nutritionist will increase the types of foods you can eat if you are handling liquids well:   If you have severe vomiting or nausea and cannot handle clear liquids lasting longer than 1 day, call your surgeon  Protein Shake   Drink at least 2 ounces of shake 5-6 times per day   Each serving of protein shakes (usually 8 - 12 ounces) should have a minimum of:  o 15 grams of protein  o And no more than 5 grams of carbohydrate    Goal for protein each day: o Men = 80 grams per day o Women = 60 grams per day   Protein powder may be added to fluids such as non-fat milk or Lactaid milk or Soy milk (limit to 35 grams added protein powder per serving)  Hydration   Slowly increase the amount of water and other clear liquids as tolerated (See Acceptable Fluids)   Slowly increase the amount of protein shake as tolerated     Sip fluids slowly and throughout the day   May use sugar substitutes in small amounts (no more than 6 - 8 packets per day; i.e. Splenda)  Fluid Goal   The first goal is to drink at least 8 ounces of protein shake/drink per day (or as directed by the nutritionist); some examples of protein shakes are [employer], Dillard's, EAS Edge HP, and Unjury. See handout from pre-op Bariatric Education Class: o Slowly increase the amount of protein shake you drink as tolerated o You may find it easier to slowly sip shakes throughout the day o It is important to get your proteins in first   Your fluid goal is to drink 64 - 100 ounces of fluid daily o It may take a few weeks to build up to this   32 oz (or more) should be clear liquids  And    32 oz (or more) should be full liquids (see below for examples)   Liquids should not contain sugar, caffeine, or  carbonation  Clear Liquids:   Water or Sugar-free flavored water (i.e. Fruit H2O, Propel)   Decaffeinated coffee or tea (sugar-free)   Crystal Lite, Wyler's Lite, Minute Maid Lite   Sugar-free Jell-O   Bouillon or broth   Sugar-free Popsicle:   *Less than 20 calories each; Limit 1 per day  Full Liquids: Protein Shakes/Drinks + 2 choices per day of other full liquids   Full liquids must be: o No More Than 12 grams of Carbs per serving  o No  More Than 3 grams of Fat per serving   Strained low-fat cream soup   Non-Fat milk   Fat-free Lactaid Milk   Sugar-free yogurt (Dannon Lite & Fit, Greek yogurt)      Vitamins and Minerals   Start 1 day after surgery unless otherwise directed by your surgeon   2 Chewable Multivitamin / Multimineral Supplement with iron (i.e. Centrum for Adults)   Vitamin B-12, 350 - 500 micrograms sub-lingual (place tablet under the tongue) each day   Chewable Calcium Citrate with Vitamin D-3 (Example: 3 Chewable Calcium Plus 600 with Vitamin D-3) o Take 500 mg three (3) times a day for a total of 1500 mg each day o Do not take all 3 doses of calcium at one time as it may cause constipation, and you can only absorb 500 mg  at a time  o Do not mix multivitamins containing iron with calcium supplements; take 2 hours apart o Do not substitute Tums (calcium carbonate) for your calcium   Menstruating women and those at risk for anemia (a blood disease that causes weakness) may need extra iron o Talk with your doctor to see if you need more iron   If you need extra iron: Total daily Iron recommendation (including Vitamins) is 50 to 100 mg Iron/day   Do not stop taking or change any vitamins or minerals until you talk to your nutritionist or surgeon   Your nutritionist and/or surgeon must approve all vitamin and mineral supplements   Activity and Exercise: It is important to continue walking at home.  Limit your physical activity as instructed by your doctor.  During  this time, use these guidelines:   Do not lift anything greater than ten (10) pounds for at least two (2) weeks   Do not go back to work or drive until Designer, industrial/product says you can   You may have sex when you feel comfortable  o It is VERY important for female patients to use a reliable birth control method; fertility often increases after surgery  o Do not get pregnant for at least 18 months   Start exercising as soon as your doctor tells you that you can o Make sure your doctor approves any physical activity   Start with a simple walking program   Walk 5-15 minutes each day, 7 days per week.    Slowly increase until you are walking 30-45 minutes per day Consider joining our BELT program. [phone] or email [email or web address]   Special Instructions Things to remember:   Free counseling is available for you and your family through collaboration between [facility] and [place]. Please call [phone] and leave a message   Use your CPAP when sleeping if this applies to you    Consider buying a medical alert bracelet that says you had lap-band surgery    You will likely have your first fill (fluid added to your band) 6 - 8 weeks after surgery   [facility] has a free Bariatric Surgery Support Group that meets monthly, the 3rd Thursday, 6 pm, [facility] Classrooms You can see classes online at [email or web address]   It is very important to keep all follow up appointments with your surgeon, nutritionist, primary care physician, and behavioral health practitioner o After the first year, please follow up with your bariatric surgeon and nutritionist at least once a year in order to maintain best weight loss results [facility]: [phone] [facility]: [phone]  Bariatric Nurse Coordinator: [phone]

## 2012-09-13 ENCOUNTER — Encounter: Payer: Self-pay | Admitting: *Deleted

## 2012-09-13 ENCOUNTER — Encounter: Payer: Medicare Other | Attending: Surgery | Admitting: *Deleted

## 2012-09-13 DIAGNOSIS — Z01818 Encounter for other preprocedural examination: Secondary | ICD-10-CM | POA: Insufficient documentation

## 2012-09-13 DIAGNOSIS — Z713 Dietary counseling and surveillance: Secondary | ICD-10-CM | POA: Insufficient documentation

## 2012-09-13 DIAGNOSIS — E669 Obesity, unspecified: Secondary | ICD-10-CM | POA: Insufficient documentation

## 2012-09-13 NOTE — Patient Instructions (Addendum)
Patient to follow Phase 3A-Soft, High Protein Diet and follow-up at [facility] in 6 weeks for 2 months post-op nutrition visit for diet advancement.  TANITA BODY COMP RESULTS   09/05/11  08/04/12 09/13/12   BMI (kg/m^2)  60.8 60.0 56.5   Fat Mass (lbs)  236.5  232.0 209.0   Fat Free Mass (lbs)  152.0  151.0 152.0   Total Body Water (lbs)  111.0  110.5 111.5

## 2012-09-13 NOTE — Progress Notes (Signed)
Bariatric Class:  Appt start time: 1600   End time:  1700.  2 Week Post-Operative Nutrition Class  Patient was seen on 09/13/2012 for Post-Operative Nutrition [facility].   Surgery date: 08/29/12 Surgery type: Sleeve Start weight at [facility]: 388.5 lbs (09/05/11)  Weight today: 361.0 lbs Weight change: 22.0 lbs Total weight lost: 27.5 lbs  TANITA BODY COMP RESULTS   09/05/11  08/04/12 09/13/12   BMI (kg/m^2)  60.8 60.0 56.5   Fat Mass (lbs)  236.5  232.0 209.0   Fat Free Mass (lbs)  152.0  151.0 152.0   Total Body Water (lbs)  111.0  110.5 111.5   The following the learning objectives were met by the patient during this course:  Identifies Phase 3A (Soft, High Proteins) Dietary Goals and will begin from 2 weeks post-operatively to 2 months post-operatively  Identifies appropriate sources of fluids and proteins   States protein recommendations and appropriate sources post-operatively  Identifies the need for appropriate texture modifications, mastication, and bite sizes when consuming solids  Identifies appropriate multivitamin and calcium sources post-operatively  Describes the need for physical activity post-operatively and will follow MD recommendations  States when to call healthcare provider regarding medication questions or post-operative complications  Handouts given during class include:  Phase 3A: Soft, High Protein Diet Handout  Follow-Up Plan: Patient will follow-up at [facility] in 6 weeks for 2 months post-op nutrition visit for diet advancement per MD.

## 2012-09-16 ENCOUNTER — Ambulatory Visit (INDEPENDENT_AMBULATORY_CARE_PROVIDER_SITE_OTHER): Payer: Medicare Other | Admitting: Surgery

## 2012-09-16 ENCOUNTER — Encounter (INDEPENDENT_AMBULATORY_CARE_PROVIDER_SITE_OTHER): Payer: Self-pay | Admitting: Surgery

## 2012-09-16 VITALS — BP 148/82 | HR 92 | Temp 99.1°F | Resp 16 | Ht 67.0 in | Wt 361.2 lb

## 2012-09-16 DIAGNOSIS — Z9889 Other specified postprocedural states: Secondary | ICD-10-CM

## 2012-09-16 DIAGNOSIS — Z903 Acquired absence of stomach [part of]: Secondary | ICD-10-CM | POA: Insufficient documentation

## 2012-09-16 NOTE — Progress Notes (Signed)
Crystal Garza 42 y.o.  Body mass index is 56.56 kg/(m^2).  Patient Active Problem List   Diagnosis Date Noted  . Dysuria 05/31/2012  . Rotator cuff syndrome of left shoulder 04/22/2012  . 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 02/17/2012  . Bipolar disorder 01/19/2012  . Encounter for annual health examination 11/04/2011  . Peripheral edema 08/27/2011  . Skin yeast infection 08/25/2011  . Need for prophylactic vaccination and inoculation against influenza 06/02/2011  . Nephrolithiasis 02/20/2011  . Nocturia 01/22/2011  . Arm paresthesia, left 12/30/2010  . Back pain 10/22/2010  . Eczematous dermatitis 10/22/2010  . Iron deficiency anemia 06/27/2010  . Anxiety 06/27/2010  . MIGRAINE, CHRONIC 09/12/2009  . Morbid obesity-BMI 62 10/30/2008  . OSTEOARTHRITIS, KNEES, BILATERAL 07/05/2007  . DEPRESSION, MAJOR, RECURRENT 07/01/2006  . HYPERTENSION, BENIGN SYSTEMIC 07/01/2006  . RHINITIS, ALLERGIC 07/01/2006  . GASTROESOPHAGEAL REFLUX, NO ESOPHAGITIS 07/01/2006    Allergies  Allergen Reactions  . Codeine Rash    Past Surgical History  Procedure Laterality Date  . Abdominal hysterectomy    . Knee arthroscopy    . Cesarean section  05/1991  . Breath tek h pylori  07/28/2011    Procedure: BREATH TEK H PYLORI;  Surgeon: Valarie Merino, MD;  Location: [facility];  Service: General;  Laterality: N/A;  to be done at 745  . Esophageal biopsy  02/16/2012    Procedure: BIOPSY;  Surgeon: Valarie Merino, MD;  Location: [facility];  Service: General;;  biopsy of mass x 2  . Eus  03/03/2012    Procedure: UPPER ENDOSCOPIC ULTRASOUND (EUS) LINEAR;  Surgeon: Rachael Fee, MD;  Location: [facility];  Service: Endoscopy;  Laterality: N/A;  . Laparoscopic gastric banding    . Tubal ligation    . Laparoscopic gastrectomy  04/08/2012    Procedure: LAPAROSCOPIC GASTRECTOMY;  Surgeon: Valarie Merino, MD;  Location: [facility];  Service:  General;;  removal of GIST tumor of stomach  . Laparoscopic gastric sleeve resection N/A 08/29/2012    Procedure: LAPAROSCOPIC GASTRIC SLEEVE RESECTION;  Surgeon: Valarie Merino, MD;  Location: [facility];  Service: General;  Laterality: N/A;  Sleeve Gastrectomy  . Esophagogastroduodenoscopy N/A 08/29/2012    Procedure: ESOPHAGOGASTRODUODENOSCOPY (EGD);  Surgeon: Valarie Merino, MD;  Location: [facility];  Service: General;  Laterality: N/A;   [name], MD No diagnosis found.  Two weeks and 4 days out from her lap sleeve gastrectomy.  Her incisions are healing well the cup levels. She Feels like She Is Doing Very Well. She Is Eating and Drinking in Going a Support Group. She Is Seen Serum and Is Following Her Diet. I Will See Her Back in 4-6 Weeks for Routine Followup. She is taking her Lovenox shots as requested.  Low grade temp noted but she is having no chills or shakes [name], MD, [facility] [phone] beeper [phone]  09/16/2012 1:45 PM

## 2012-09-16 NOTE — Patient Instructions (Signed)
Thanks for your patience.  If you need further assistance after leaving the office, please call our office and speak with a [facility] nurse.  [phone].  If you want to leave a message for Dr. Bassam Dresch, please call his office phone at [phone]. 

## 2012-09-28 ENCOUNTER — Ambulatory Visit (INDEPENDENT_AMBULATORY_CARE_PROVIDER_SITE_OTHER): Payer: Medicare Other | Admitting: Surgery

## 2012-10-17 ENCOUNTER — Other Ambulatory Visit: Payer: Medicare Other

## 2012-10-17 ENCOUNTER — Ambulatory Visit (INDEPENDENT_AMBULATORY_CARE_PROVIDER_SITE_OTHER): Payer: Medicare Other | Admitting: *Deleted

## 2012-10-17 DIAGNOSIS — Z111 Encounter for screening for respiratory tuberculosis: Secondary | ICD-10-CM

## 2012-10-17 NOTE — Progress Notes (Signed)
Tuberculin skin test applied to right ventral forearm.Pt will come in Wednesday to have test read. Wyatt Haste, RN-BSN

## 2012-10-18 ENCOUNTER — Telehealth: Payer: Self-pay | Admitting: Family Medicine

## 2012-10-18 NOTE — Telephone Encounter (Signed)
Please call in Vicodin 5-325mg  #30 with 0 refills for now, and we can discuss this more at her visit.  Thank you! Alaric Gladwin M. Lilyian Quayle, M.D. 10/18/2012 4:35 PM

## 2012-10-18 NOTE — Telephone Encounter (Signed)
Called to [facility] prescription.  See MD note below.  Ellizabeth Dacruz, Darlyne Russian, CMA

## 2012-10-18 NOTE — Telephone Encounter (Signed)
States is out of pain meds. Has an appt scheduled for 6/26 for pap. pls call patient [phone]

## 2012-10-18 NOTE — Telephone Encounter (Signed)
Pt requesting pain med for her knee and back pain.  Told patient I would send the request to Dr. Mikel Cella.  Pt states Dr. Mikel Cella knows about her knee and back pain.  Nahom Carfagno, Darlyne Russian, CMA

## 2012-10-19 ENCOUNTER — Encounter: Payer: Self-pay | Admitting: *Deleted

## 2012-10-19 ENCOUNTER — Ambulatory Visit (INDEPENDENT_AMBULATORY_CARE_PROVIDER_SITE_OTHER): Payer: Medicare Other | Admitting: *Deleted

## 2012-10-19 ENCOUNTER — Ambulatory Visit (INDEPENDENT_AMBULATORY_CARE_PROVIDER_SITE_OTHER): Payer: Medicare Other | Admitting: Surgery

## 2012-10-19 DIAGNOSIS — Z111 Encounter for screening for respiratory tuberculosis: Secondary | ICD-10-CM

## 2012-10-19 LAB — TB SKIN TEST
Induration: 0 mm
TB Skin Test: NEGATIVE

## 2012-10-19 NOTE — Progress Notes (Signed)
PPD Reading Note PPD read and results entered in EpicCare. Result: 0 mm induration. Interpretation: negative Pt given letter. No further needs. Wyatt Haste, RN-BSN

## 2012-10-25 ENCOUNTER — Encounter: Payer: Medicare Other | Attending: Surgery | Admitting: *Deleted

## 2012-10-25 ENCOUNTER — Encounter: Payer: Self-pay | Admitting: *Deleted

## 2012-10-25 DIAGNOSIS — Z01818 Encounter for other preprocedural examination: Secondary | ICD-10-CM | POA: Insufficient documentation

## 2012-10-25 DIAGNOSIS — E669 Obesity, unspecified: Secondary | ICD-10-CM | POA: Insufficient documentation

## 2012-10-25 DIAGNOSIS — Z713 Dietary counseling and surveillance: Secondary | ICD-10-CM | POA: Insufficient documentation

## 2012-10-25 NOTE — Progress Notes (Addendum)
Follow-up visit:  8 Weeks Post-Operative Sleeve Gastrectomy Surgery  Medical Nutrition Therapy:  Appt start time: 0850  End time: 0920.  Primary concerns today: Post-operative Bariatric Surgery Nutrition Management. Crystal Garza returns for f/u with additional 13.5 lb wt loss.  Now walks around [place] instead of using wheelchair. Reports problems tolerating chicken, though likely not adding enough moisture. Not taking supplements. Reports chronic rash under pannus.   Surgery date: 08/29/12   Surgery type: Sleeve Start weight at [facility]: 388.5 lbs (09/05/11)  Weight today: 347.5 lbs Weight change: 13.5 lbs Total weight lost: 41.0 lbs  TANITA BODY COMP RESULTS   09/05/11  08/04/12 09/13/12 10/25/12   BMI (kg/m^2)  60.8 60.0 56.5 54.4   Fat Mass (lbs)  236.5  232.0 209.0 200.0   Fat Free Mass (lbs)  152.0  151.0 152.0 147.5   Total Body Water (lbs)  111.0  110.5 111.5 108.0   24-hr recall: B (AM): Protein shake (15g) Snk (AM): None  L (PM): Wendy's chili (1/4 of small) - (3-5g) Snk (PM): None  D (PM): 1.5-2 oz baked fish OR [place] shrimp (6-7 ea) - (15-20g) Snk (PM): None  Fluid intake: 32 oz 1/2 cut tea (McD's); Atkins shake (11 oz); water (24 oz) = 65-70 oz Estimated total protein intake:  40-50 g  Medications: See medication list Supplementation: Not taking  Using straws: No Drinking while eating: No Hair loss: Mild Carbonated beverages: No N/V/D/C:  Not tolerating chicken well Dumping syndrome: None  Recent physical activity:  Walking @ track (2 laps); was going to [employer] but does not like atmosphere and cannot use several of the machines. Plans to contact BELT program.   Progress Towards Goal(s):  In progress.  Handouts given during visit include:  Phase 3B: High Protein + Non-Starchy Vegetables  BELT Flyer  Low CHO Snack List   [employer] Prescription   Nutritional Diagnosis:  [place]-3.3 Overweight/obesity related to past poor dietary habits and physical inactivity as  evidenced by patient w/ recent sleeve gastrectomy surgery attempting to follow dietary guidelines for continued weight loss.    Intervention:  Nutrition education/diet advancement.  Monitoring/Evaluation:  Dietary intake, exercise, and body weight. Follow up in 1 months for 3 month post-op visit.

## 2012-10-25 NOTE — Patient Instructions (Addendum)
Goals:  Follow Phase 3B: High Protein + Non-Starchy Vegetables  Increase lean protein foods to meet 60-80g goal  Aim for >30 min of physical activity daily  Take your supplements!! :)

## 2012-10-27 ENCOUNTER — Other Ambulatory Visit (HOSPITAL_COMMUNITY)
Admission: RE | Admit: 2012-10-27 | Discharge: 2012-10-27 | Disposition: A | Discharge status: Home / Self Care | Payer: Medicare Other | Source: Ambulatory Visit | Attending: Family Medicine | Admitting: Family Medicine

## 2012-10-27 ENCOUNTER — Encounter: Payer: Self-pay | Admitting: Family Medicine

## 2012-10-27 ENCOUNTER — Ambulatory Visit (INDEPENDENT_AMBULATORY_CARE_PROVIDER_SITE_OTHER): Payer: Medicare Other | Admitting: Family Medicine

## 2012-10-27 VITALS — BP 110/68 | HR 96 | Temp 98.0°F | Ht 67.0 in | Wt 342.0 lb

## 2012-10-27 DIAGNOSIS — Z113 Encounter for screening for infections with a predominantly sexual mode of transmission: Secondary | ICD-10-CM | POA: Insufficient documentation

## 2012-10-27 DIAGNOSIS — Z01419 Encounter for gynecological examination (general) (routine) without abnormal findings: Secondary | ICD-10-CM | POA: Insufficient documentation

## 2012-10-27 DIAGNOSIS — Z9889 Other specified postprocedural states: Secondary | ICD-10-CM

## 2012-10-27 DIAGNOSIS — M549 Dorsalgia, unspecified: Secondary | ICD-10-CM

## 2012-10-27 DIAGNOSIS — Z1151 Encounter for screening for human papillomavirus (HPV): Secondary | ICD-10-CM | POA: Insufficient documentation

## 2012-10-27 DIAGNOSIS — Z903 Acquired absence of stomach [part of]: Secondary | ICD-10-CM

## 2012-10-27 DIAGNOSIS — I10 Essential (primary) hypertension: Secondary | ICD-10-CM

## 2012-10-27 DIAGNOSIS — Z124 Encounter for screening for malignant neoplasm of cervix: Secondary | ICD-10-CM

## 2012-10-27 MED ORDER — TRIAMCINOLONE ACETONIDE 0.1 % EX OINT: 1.0000 "application " | TOPICAL_OINTMENT | Freq: Two times a day (BID) | CUTANEOUS | Status: DC | PRN

## 2012-10-27 MED ORDER — OXYCODONE-ACETAMINOPHEN 5-325 MG PO TABS: 1.0000 | ORAL_TABLET | Freq: Three times a day (TID) | ORAL | Status: DC | PRN

## 2012-10-27 MED ORDER — NYSTATIN 100000 UNIT/GM EX POWD: Freq: Four times a day (QID) | CUTANEOUS | Status: DC

## 2012-10-27 MED ORDER — LISINOPRIL 10 MG PO TABS: 10.0000 mg | ORAL_TABLET | Freq: Every day | ORAL | Status: DC

## 2012-10-27 NOTE — Assessment & Plan Note (Signed)
Continue Percocet as needed for pain. Given #60/0R. Will continue to monitor with weight loss and hopefully be able to wean.

## 2012-10-27 NOTE — Assessment & Plan Note (Signed)
BP low today. Will change to Lisinopril 10mg  today. Continue to monitor. With weight loss and exercise, may be able to wean off completely. LAbs at next visit including lipids.

## 2012-10-27 NOTE — Progress Notes (Signed)
Patient ID: Crystal Garza, female   DOB: [birth year], 42 y.o.   MRN: [id number]  [facility] Tao Satz M. Deloyce Walthers, MD Phone: [phone]   Subjective: HPI: Patient is a 42 y.o. female presenting to clinic today for follow up and pap smear. Concerns today include wants to change her BP meds  1. S/p weight loss surgery- Patient had her lap sleeve in May 2014. She is doing well and is recovering without problems. She has lost 45lbs since the surgery. Followed closely by Dr. Daphine Deutscher. Needs B12 shot, but she will work this out with the nutritionist. She is exercising more and was able to walk around the track for the first time in 10 years. Does have a rash under her pannus that is painful.  2. Pap smear- Patient is here for pap smear today. Last was in 2011 which was negative. Patient is s/p hysterectomy in 2005 which was supracervical.  3. Pain- Pain is in her knees and back, likely due to her obesity. I am optimistic this will improve with weight loss but she feels like she will need knee replacement. Dr. Jorge Mandril was previous orthopedist. She states it is not getting any better  4. HTN- She does not take her medication every day because of the diuretic. States it makes her go to the restroom too often. Never has symptoms of elevated BP. BP 110/68 today.  History Reviewed: Non- smoker. Health Maintenance: Pap today. Last direct LDL 95 in July 2013, will need at next visit  ROS: Please see HPI above.  Objective: Office vital signs reviewed. There were no vitals taken for this visit.  Physical Examination:  General: Awake, alert. NAD HEENT: Atraumatic, normocephalic. MMM. Pulm: CTAB, no wheezes Cardio: RRR, no murmurs appreciated Back: TTP right lumbar paraspinal and right hip. Abdomen:Obese, soft, nontender, nondistended. Multiple well healed surgical sites. Erythematous, scaling under pannus GU: Cervix visualized. 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 Bleeding after pap. No discharge. Extremities: No edema Neuro: Grossly intact  Assessment: 42 y.o. female follow up and pap  Plan: See Problem List and After Visit Summary

## 2012-10-27 NOTE — Patient Instructions (Addendum)
It was good to see you today. If your pap is abnormal, I will call you. Otherwise, I will send you a letter.  Your prescriptions are at the pharmacy. If you feel your blood pressure is high, you can double up on the lower dose of Lisinopril.  I will see you back in 3 months or sooner if you need anything.  Boen Sterbenz M. Duane Trias, M.D.

## 2012-10-27 NOTE — Assessment & Plan Note (Signed)
Doing well. Given Nystatin powder to intertrigo under pannus. Con't to follow with Dr. Daphine Deutscher

## 2012-10-27 NOTE — Assessment & Plan Note (Signed)
Pap with GC/Ch sent today. Will await results.

## 2012-11-01 ENCOUNTER — Encounter: Payer: Self-pay | Admitting: Family Medicine

## 2012-11-18 ENCOUNTER — Other Ambulatory Visit: Payer: Self-pay | Admitting: Family Medicine

## 2012-11-18 NOTE — Telephone Encounter (Signed)
Per clinic policy pt needs to come in and be seen in clinic for a refill.  Please inform her.  Thanks Rhyanna Sorce,  Damari Hiltz

## 2012-11-18 NOTE — Telephone Encounter (Signed)
Pt is requesting a refill on Oxycodone be sent to her pharmacy on file. She did go to pharmacy to request a refill , but they told her she needed to call us first. Myriam Jacobson

## 2012-11-18 NOTE — Telephone Encounter (Signed)
Patient is calling back checking back on the status on the request for refill.

## 2012-11-21 MED ORDER — OXYCODONE-ACETAMINOPHEN 5-325 MG PO TABS: 1.0000 | ORAL_TABLET | Freq: Three times a day (TID) | ORAL | Status: DC | PRN

## 2012-11-21 NOTE — Addendum Note (Signed)
Addended by: Hilarie Fredrickson on: 11/21/2012 11:28 AM   Modules accepted: Orders

## 2012-11-21 NOTE — Telephone Encounter (Signed)
I misunderstood the message. Refill script has been printed and available for her to pick up at the front. Please let her know.  Thanks, [employer]. Raja Caputi, M.D.

## 2012-11-21 NOTE — Telephone Encounter (Signed)
Spoke with pt to inform her that she would need an ov to get a refill of her pain medication.  She states that when she was in clinic and saw Dr. Mikel Cella, she was told all she had to do was call and it would be refilled.  Please advise if pt needs an appt or can just get a refill of her medication.

## 2012-11-22 ENCOUNTER — Encounter: Payer: Self-pay | Admitting: *Deleted

## 2012-11-22 ENCOUNTER — Telehealth: Payer: Self-pay | Admitting: Family Medicine

## 2012-11-22 ENCOUNTER — Encounter: Payer: Medicare Other | Attending: Surgery | Admitting: *Deleted

## 2012-11-22 DIAGNOSIS — Z713 Dietary counseling and surveillance: Secondary | ICD-10-CM | POA: Insufficient documentation

## 2012-11-22 DIAGNOSIS — Z01818 Encounter for other preprocedural examination: Secondary | ICD-10-CM | POA: Insufficient documentation

## 2012-11-22 DIAGNOSIS — E669 Obesity, unspecified: Secondary | ICD-10-CM | POA: Insufficient documentation

## 2012-11-22 NOTE — Patient Instructions (Addendum)
Goals:  Follow Phase 3B: High Protein + Non-Starchy Vegetables  Increase lean protein foods to meet 60-80g goal  Try 90% lean (or leaner) hamburger meat or ground [place]  Add 15 grams of carbohydrate (fruit, whole grain) with meals  Aim for >30 min of physical activity daily  Take your supplements!! :)  Try [place] Sublingual B Complex ([employer]; $4.75)

## 2012-11-22 NOTE — Telephone Encounter (Signed)
Pt is requesting a refill on percocet for pick up JW

## 2012-11-22 NOTE — Progress Notes (Addendum)
Follow-up visit: 12 Weeks Post-Operative Sleeve Gastrectomy Surgery  Medical Nutrition Therapy:  Appt start time: 0840  End time: 0910.  Primary concerns today: Post-operative Bariatric Surgery Nutrition Management. Crystal Garza returns for f/u with additional 8.5 lb wt loss. Doing well and tolerating chicken now. Having trouble with red meat though.   Surgery date: 08/29/12   Surgery type: Sleeve Start weight at [facility]: 388.5 lbs (09/05/11)  Weight today: 339.0 lbs Weight change: 8.5 lbs Total weight lost: 49.5 lbs  TANITA BODY COMP RESULTS   09/05/11  08/04/12 09/13/12 10/25/12 11/22/12   BMI (kg/m^2)  60.8 60.0 56.5 54.4 53.1   Fat Mass (lbs)  236.5  232.0 209.0 200.0 198.5   Fat Free Mass (lbs)  152.0  151.0 152.0 147.5 140.5   Total Body Water (lbs)  111.0  110.5 111.5 108.0 103.0   24-hr recall: B (AM): Boiled egg (6g) Snk (AM):  Protein shake (15g) L (PM):  (2 oz) baked chicken leg, 3-4 pcs steamed broccoli - (6-8g)  Snk (PM): NONE D (PM): 3 oz grilled chicken (20-25g) Snk (PM): None  Fluid intake:  35-50 oz crystal light; water (50 oz) =  85-100 oz Estimated total protein intake:  45-55 g  Medications: See medication list - not taking Prilosec Supplementation: Not taking - waiting to hear from [employer]. Planning to call them again to f/u.  Gave samples.   Using straws: No Drinking while eating: No Hair loss: Mild Carbonated beverages: No N/V/D/C:  Tolerating chicken now, though doesn't do well with hamburger meat (80/20%) Dumping syndrome:  Thinks she had with 1/2 cut sweet tea recently  Recent physical activity:  Walking @ track (2 laps). Contacted BELT program, though having trouble getting response.   Progress Towards Goal(s):  In progress.  Nutritional Diagnosis:  [place]-3.3 Overweight/obesity related to past poor dietary habits and physical inactivity as evidenced by patient w/ recent sleeve gastrectomy surgery attempting to follow dietary guidelines for continued weight  loss.    Intervention:  Nutrition education/reinforcement  Samples given during visit include:   Freedavite MVI: 4 bottles @ 5 tabs/bottle Lot: [id number]; Exp: 03/17  Monitoring/Evaluation:  Dietary intake, exercise, and body weight. Follow up in 3 months for 6 month post-op visit.

## 2012-11-22 NOTE — Telephone Encounter (Signed)
Left message for pt to call back.  Please inform when she does that her rx is ready for pick up at the front.  Thanks [employer], CMA.

## 2012-12-05 ENCOUNTER — Ambulatory Visit: Payer: Medicare Other | Admitting: Family Medicine

## 2012-12-07 ENCOUNTER — Encounter (INDEPENDENT_AMBULATORY_CARE_PROVIDER_SITE_OTHER): Payer: Self-pay | Admitting: Surgery

## 2012-12-07 ENCOUNTER — Ambulatory Visit (INDEPENDENT_AMBULATORY_CARE_PROVIDER_SITE_OTHER): Payer: Medicare Other | Admitting: Surgery

## 2012-12-07 ENCOUNTER — Other Ambulatory Visit: Payer: Self-pay

## 2012-12-07 VITALS — BP 142/80 | HR 80 | Temp 97.6°F | Resp 15 | Ht 67.0 in | Wt 329.8 lb

## 2012-12-07 DIAGNOSIS — Z903 Acquired absence of stomach [part of]: Secondary | ICD-10-CM

## 2012-12-07 DIAGNOSIS — Z9889 Other specified postprocedural states: Secondary | ICD-10-CM

## 2012-12-07 NOTE — Progress Notes (Signed)
[name] 42 y.o.  Body mass index is 51.64 kg/(m^2).  Patient Active Problem List   Diagnosis Date Noted  . Screening for malignant neoplasm of the cervix 10/27/2012  . 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 Dysuria 05/31/2012  . Rotator cuff syndrome of left shoulder 04/22/2012  . 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 02/17/2012  . Bipolar disorder 01/19/2012  . Encounter for annual health examination 11/04/2011  . Peripheral edema 08/27/2011  . Skin yeast infection 08/25/2011  . Need for prophylactic vaccination and inoculation against influenza 06/02/2011  . Nephrolithiasis 02/20/2011  . Nocturia 01/22/2011  . Arm paresthesia, left 12/30/2010  . Back pain 10/22/2010  . Eczematous dermatitis 10/22/2010  . Iron deficiency anemia 06/27/2010  . Anxiety 06/27/2010  . MIGRAINE, CHRONIC 09/12/2009  . Morbid obesity-BMI 62 10/30/2008  . OSTEOARTHRITIS, KNEES, BILATERAL 07/05/2007  . DEPRESSION, MAJOR, RECURRENT 07/01/2006  . HYPERTENSION, BENIGN SYSTEMIC 07/01/2006  . RHINITIS, ALLERGIC 07/01/2006  . GASTROESOPHAGEAL REFLUX, NO ESOPHAGITIS 07/01/2006    Allergies  Allergen Reactions  . Codeine Rash    Past Surgical History  Procedure Laterality Date  . Abdominal hysterectomy    . Knee arthroscopy    . Cesarean section  05/1991  . Breath tek h pylori  07/28/2011    Procedure: BREATH TEK H PYLORI;  Surgeon: Valarie Merino, MD;  Location: [facility];  Service: General;  Laterality: N/A;  to be done at 745  . Esophageal biopsy  02/16/2012    Procedure: BIOPSY;  Surgeon: Valarie Merino, MD;  Location: [facility];  Service: General;;  biopsy of mass x 2  . Eus  03/03/2012    Procedure: UPPER ENDOSCOPIC ULTRASOUND (EUS) LINEAR;  Surgeon: Rachael Fee, MD;  Location: [facility];  Service: Endoscopy;  Laterality: N/A;  . Laparoscopic gastric banding    . Tubal ligation    . Laparoscopic gastrectomy   04/08/2012    Procedure: LAPAROSCOPIC GASTRECTOMY;  Surgeon: Valarie Merino, MD;  Location: [facility];  Service: General;;  removal of GIST tumor of stomach  . Laparoscopic gastric sleeve resection N/A 08/29/2012    Procedure: LAPAROSCOPIC GASTRIC SLEEVE RESECTION;  Surgeon: Valarie Merino, MD;  Location: [facility];  Service: General;  Laterality: N/A;  Sleeve Gastrectomy  . Esophagogastroduodenoscopy N/A 08/29/2012    Procedure: ESOPHAGOGASTRODUODENOSCOPY (EGD);  Surgeon: Valarie Merino, MD;  Location: [facility];  Service: General;  Laterality: N/A;   [name], MD 1. Lap Sleeve Gastrectomy May 2014     Doing well after sleeve gastrectomy.  Incisions OK.  Has lost 44 lbs since surgery.  Return 4 months [name], MD, [facility] [phone] beeper [phone]  12/07/2012 9:14 AM

## 2012-12-07 NOTE — Patient Instructions (Signed)
Thanks for your patience.  If you need further assistance after leaving the office, please call our office and speak with a [facility] nurse.  [phone].  If you want to leave a message for Dr. Alyha Marines, please call his office phone at [phone]. 

## 2012-12-09 NOTE — Addendum Note (Signed)
Addended by: Alain Honey S on: 12/09/2012 09:12 AM   Modules accepted: Orders

## 2012-12-15 ENCOUNTER — Telehealth: Payer: Self-pay | Admitting: Family Medicine

## 2012-12-15 MED ORDER — OXYCODONE HCL 5 MG PO TABS: 5.0000 mg | ORAL_TABLET | Freq: Four times a day (QID) | ORAL | Status: DC | PRN

## 2012-12-15 NOTE — Telephone Encounter (Signed)
Refill printed and left for patient.  She will need a follow up appointment for any additional refills. Please have her schedule appt in about 4 weeks.  Thanks! Amber M. Hairford, M.D.

## 2012-12-15 NOTE — Telephone Encounter (Signed)
Pt is requesting a refill on percocet. JW

## 2012-12-15 NOTE — Telephone Encounter (Signed)
Forward to PCP for pain med request.

## 2012-12-15 NOTE — Telephone Encounter (Signed)
Called and told patient Rx is ready and that she needs to schedule a follow up appointment within the next 4 weeks with PCP.Busick, Rodena Medin

## 2013-01-09 ENCOUNTER — Telehealth: Payer: Self-pay | Admitting: Family Medicine

## 2013-01-09 NOTE — Telephone Encounter (Signed)
Pt says she has to

## 2013-01-17 ENCOUNTER — Ambulatory Visit (INDEPENDENT_AMBULATORY_CARE_PROVIDER_SITE_OTHER): Payer: Medicare Other | Admitting: Family Medicine

## 2013-01-17 VITALS — BP 121/76 | HR 99 | Temp 98.2°F | Ht 67.0 in | Wt 323.0 lb

## 2013-01-17 DIAGNOSIS — R82998 Other abnormal findings in urine: Secondary | ICD-10-CM

## 2013-01-17 DIAGNOSIS — Z23 Encounter for immunization: Secondary | ICD-10-CM

## 2013-01-17 DIAGNOSIS — B372 Candidiasis of skin and nail: Secondary | ICD-10-CM

## 2013-01-17 DIAGNOSIS — M549 Dorsalgia, unspecified: Secondary | ICD-10-CM

## 2013-01-17 DIAGNOSIS — M545 Low back pain, unspecified: Secondary | ICD-10-CM

## 2013-01-17 DIAGNOSIS — R8290 Unspecified abnormal findings in urine: Secondary | ICD-10-CM

## 2013-01-17 LAB — POCT URINALYSIS DIPSTICK
Bilirubin, UA: NEGATIVE
Glucose, UA: NEGATIVE
Ketones, UA: NEGATIVE
Leukocytes, UA: NEGATIVE
Nitrite, UA: POSITIVE
Protein, UA: NEGATIVE
Spec Grav, UA: 1.025
Urobilinogen, UA: 1
pH, UA: 6

## 2013-01-17 LAB — POCT UA - MICROSCOPIC ONLY

## 2013-01-17 MED ORDER — OXYCODONE-ACETAMINOPHEN 7.5-325 MG PO TABS: 1.0000 | ORAL_TABLET | ORAL | Status: DC | PRN

## 2013-01-17 MED ORDER — NYSTATIN 100000 UNIT/GM EX CREA: 1.0000 "application " | TOPICAL_CREAM | Freq: Two times a day (BID) | CUTANEOUS | Status: DC

## 2013-01-17 MED ORDER — OXYCODONE-ACETAMINOPHEN 7.5-325 MG PO TABS: 1.0000 | ORAL_TABLET | Freq: Four times a day (QID) | ORAL | Status: DC | PRN

## 2013-01-17 NOTE — Assessment & Plan Note (Signed)
Refilled Nystatin cream to use on intertrigo area. F/u if fails to resolve.

## 2013-01-17 NOTE — Progress Notes (Signed)
Patient ID: Crystal Garza, female   DOB: 07/12/1970, 42 y.o.   MRN: [id number]  [facility] Amber M. Hairford, MD Phone: [phone]   Subjective: HPI: Patient is a 42 y.o. female presenting to clinic today for follow up appointment for pain medications. Concerns today include back pain  1. Back pain - Concerned about UTI. Has some vague abdominal pain, no dysuria, no discharge, no vaginal bleeding. She has chronic pain of her knees and back. Low back pain being treated with cortisone shot in back tomorrow by Dr. Ezzard Standing at [facility]. Last visit there was in August.   2. Knee pain- Total knee after January. Still losing weight. Requesting increased dose because of more walking. Takes one pill every 6-8 hours.   3. Intertrigo - Still smells like yeast under pannus. Redness and itching. Would like cream rather than powder.  History Reviewed: Non-smoker. Health Maintenance: Needs flu shot Social history: Started back to school. Now doing online classes. Wants to go into nursing.   ROS: Please see HPI above.  Objective: Office vital signs reviewed. BP 121/76  Pulse 99  Temp(Src) 98.2 F (36.8 C) (Oral)  Ht 5\' 7"  (1.702 m)  Wt 323 lb (146.512 kg)  BMI 50.58 kg/m2  Physical Examination:  General: Awake, alert. NAD HEENT: Atraumatic, normocephalic. MMM. Neck: No masses palpated. No LAD Pulm: CTAB, no wheezes Cardio: RRR, no murmurs appreciated Abdomen: Obese, +BS, soft, nontender, nondistended Extremities: No edema Neuro: Grossly intact  Assessment: 42 y.o. female follow up  Plan: See Problem List and After Visit Summary

## 2013-01-17 NOTE — Patient Instructions (Addendum)
It was good to see you!  Keep me updated on your back injections and plans for knee replacement. Hopefully the higher dose of Percocet will help more, but we will plan on tapering you off of this after you are feeling better.  I will see you back in 3 months.  Markeisha Mancias M. Michalla Ringer, M.D.

## 2013-01-17 NOTE — Assessment & Plan Note (Signed)
UA not concerning for UTI but will send for culture. Increased percocet to 7.5/325mg  to take every 6-8 hours. Discussed again with her that we will need to taper this off after her back injections and knee replacement.

## 2013-01-19 ENCOUNTER — Telehealth: Payer: Self-pay | Admitting: Family Medicine

## 2013-01-19 LAB — URINE CULTURE: Colony Count: >=100000

## 2013-01-19 MED ORDER — CEPHALEXIN 500 MG PO CAPS: 500.0000 mg | ORAL_CAPSULE | Freq: Three times a day (TID) | ORAL | Status: DC

## 2013-01-19 NOTE — Addendum Note (Signed)
Addended by: Hilarie Fredrickson on: 01/19/2013 08:41 AM   Modules accepted: Orders

## 2013-01-19 NOTE — Telephone Encounter (Signed)
Shanda Bumps informed pt of results

## 2013-01-19 NOTE — Telephone Encounter (Signed)
LMOVM for pt to return call .Fleeger, Jessica Dawn  

## 2013-01-19 NOTE — Telephone Encounter (Signed)
Could you please call Crystal Garza and let her know that her urine culture shows she does have a UTI even though it did not look like it when she was in clinic.  I have sent in Keflex to [facility] for her. She should take it for the next 10 days. Return if she continues to have problems.  Thanks! Amber M. Hairford, M.D.

## 2013-02-09 ENCOUNTER — Other Ambulatory Visit: Payer: Self-pay | Admitting: Orthopaedic Surgery

## 2013-02-09 DIAGNOSIS — M79602 Pain in left arm: Secondary | ICD-10-CM

## 2013-02-15 ENCOUNTER — Telehealth: Payer: Self-pay | Admitting: Family Medicine

## 2013-02-15 NOTE — Telephone Encounter (Signed)
Patient broken out with a rash and wants to know can she use her eczema cream on it?

## 2013-02-15 NOTE — Telephone Encounter (Signed)
Yes, her eczema cream should work for most rashes.   Thanks! Royalty Fakhouri M. Aric Jost, M.D.

## 2013-02-15 NOTE — Telephone Encounter (Signed)
Will forward to MD. Crystal Garza,CMA  

## 2013-02-16 NOTE — Telephone Encounter (Signed)
Left message for pt on identified VM with message from Dr. Mikel Cella.  Advised that if it doesn't work to call us back and we would get her in to see someone. Katara Griner,CMA

## 2013-02-17 ENCOUNTER — Ambulatory Visit: Payer: Medicare Other | Admitting: Family Medicine

## 2013-02-20 ENCOUNTER — Ambulatory Visit
Admission: RE | Admit: 2013-02-20 | Discharge: 2013-02-20 | Disposition: A | Discharge status: Home / Self Care | Payer: Medicare Other | Source: Ambulatory Visit | Attending: Orthopaedic Surgery | Admitting: Orthopaedic Surgery

## 2013-02-20 ENCOUNTER — Other Ambulatory Visit: Payer: Medicare Other

## 2013-02-20 ENCOUNTER — Other Ambulatory Visit: Payer: Self-pay | Admitting: Orthopaedic Surgery

## 2013-02-20 DIAGNOSIS — M79602 Pain in left arm: Secondary | ICD-10-CM

## 2013-02-22 ENCOUNTER — Encounter: Payer: Self-pay | Admitting: *Deleted

## 2013-02-22 ENCOUNTER — Ambulatory Visit: Payer: Medicare Other | Admitting: *Deleted

## 2013-02-22 ENCOUNTER — Encounter: Payer: Medicare Other | Attending: Surgery | Admitting: *Deleted

## 2013-02-22 DIAGNOSIS — Z713 Dietary counseling and surveillance: Secondary | ICD-10-CM | POA: Insufficient documentation

## 2013-02-22 NOTE — Progress Notes (Signed)
Follow-up visit: 6 Months Post-Operative Sleeve Gastrectomy Surgery  Medical Nutrition Therapy:  Appt start time: 0315  End time: 0.  Primary concerns today: Post-operative Bariatric Surgery Nutrition Management. Crystal Garza returns for f/u with additional 18 lb wt loss. Doing well though not tolerating hamburger and bread. Reports small portions of foods, though consumes fruit without protein and skips breakfast most days. Reports she is unable to continue with BELT d/t injury to back, though still getting some exercise.   Surgery date: 08/29/12   Surgery type: Sleeve Start weight at [facility]: 388.5 lbs (09/05/11)  Weight today: 321.0 lbs Weight change: 18.0 lbs Total weight lost: 67.5 lbs  TANITA BODY COMP RESULTS   09/05/11  08/04/12 09/13/12 10/25/12 11/22/12 02/22/13   BMI (kg/m^2)  60.8 60.0 56.5 54.4 53.1 50.3   Fat Mass (lbs)  236.5  232.0 209.0 200.0 198.5 180.5   Fat Free Mass (lbs)  152.0  151.0 152.0 147.5 140.5 140.5   Total Body Water (lbs)  111.0  110.5 111.5 108.0 103.0 103.0   24-hr recall: B (AM): NONE  Snk (AM):  1/2 banana L (PM):  4 oz tuna w/ mustard, onion and 6 crackers (30-35g) Snk (PM): 2% cheese (1 pc) - (6g) D (PM):  1/4 cup spaghetti w/ meat sauce (1oz meat), green beans Snk (PM): 1 pc cheese (6g)  Fluid intake:  6-7 bottles water =  100+ oz Estimated total protein intake:  ~ 50 g  Medications: See medication list. HTN med lowered to 10g Supplementation:  Reports taking as directed. 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 Gave number to call and f/u.  Using straws: No Drinking while eating: No Hair loss: Mild loss continues Carbonated beverages: No N/V/D/C:  Doesn't do well with hamburger meat or bread. No other problems reported. Dumping syndrome:  Yes, recently with [employer] chocolate chip cookie  Recent physical activity:  Walking @ track (2 laps).   Progress Towards Goal(s):  In progress.  Nutritional Diagnosis:  [place]-3.3 Overweight/obesity  related to past poor dietary habits and physical inactivity as evidenced by patient w/ recent sleeve gastrectomy surgery attempting to follow dietary guidelines for continued weight loss.    Intervention:  Nutrition education/reinforcement  Monitoring/Evaluation:  Dietary intake, exercise, and body weight. Follow up in 3 months for 9 month post-op visit.

## 2013-02-22 NOTE — Patient Instructions (Addendum)
Goals:  Follow Phase 3B: High Protein + Non-Starchy Vegetables  No meal skipping - slows your metabolism down. Try protein bar, egg, protein shake, etc.  Increase lean protein foods to meet 60-80g goal  Add 15 grams of carbohydrate (fruit, whole grain) with meals  Limit fruit for now  Have protein with ALL carbs  Aim for >30 min of physical activity daily  Call about [employer] B12 nasal spray

## 2013-03-14 ENCOUNTER — Telehealth: Payer: Self-pay | Admitting: Family Medicine

## 2013-03-14 ENCOUNTER — Other Ambulatory Visit: Payer: Self-pay | Admitting: Family Medicine

## 2013-03-14 MED ORDER — OXYCODONE-ACETAMINOPHEN 7.5-325 MG PO TABS: 1.0000 | ORAL_TABLET | Freq: Four times a day (QID) | ORAL | Status: DC | PRN

## 2013-03-14 NOTE — Telephone Encounter (Signed)
Needs refill on percocet Please advise

## 2013-03-14 NOTE — Telephone Encounter (Signed)
Pt is aware and appt made for 04/12/13. Jazmin Hartsell,CMA

## 2013-03-14 NOTE — Telephone Encounter (Signed)
Refill printed and left up front.  Please remind patient I would like to see her sometime in December if she has not made an appointment yet.  Also, as we had discussed before that should start using the pain medication only as needed since we are going to taper her off of this medication completely.  Thanks, [employer]. Emer Onnen, M.D.

## 2013-03-23 IMAGING — CR DG CHEST 2V
2 series · 2 of 2 positions shown · non-contrast
Comparison: 05/03/2011

CLINICAL DATA: Preoperative evaluation for bariatric screening.
Nonsmoker.  History of hypertension

CHEST - 2 VIEW

[view not recorded (1 of 2)]
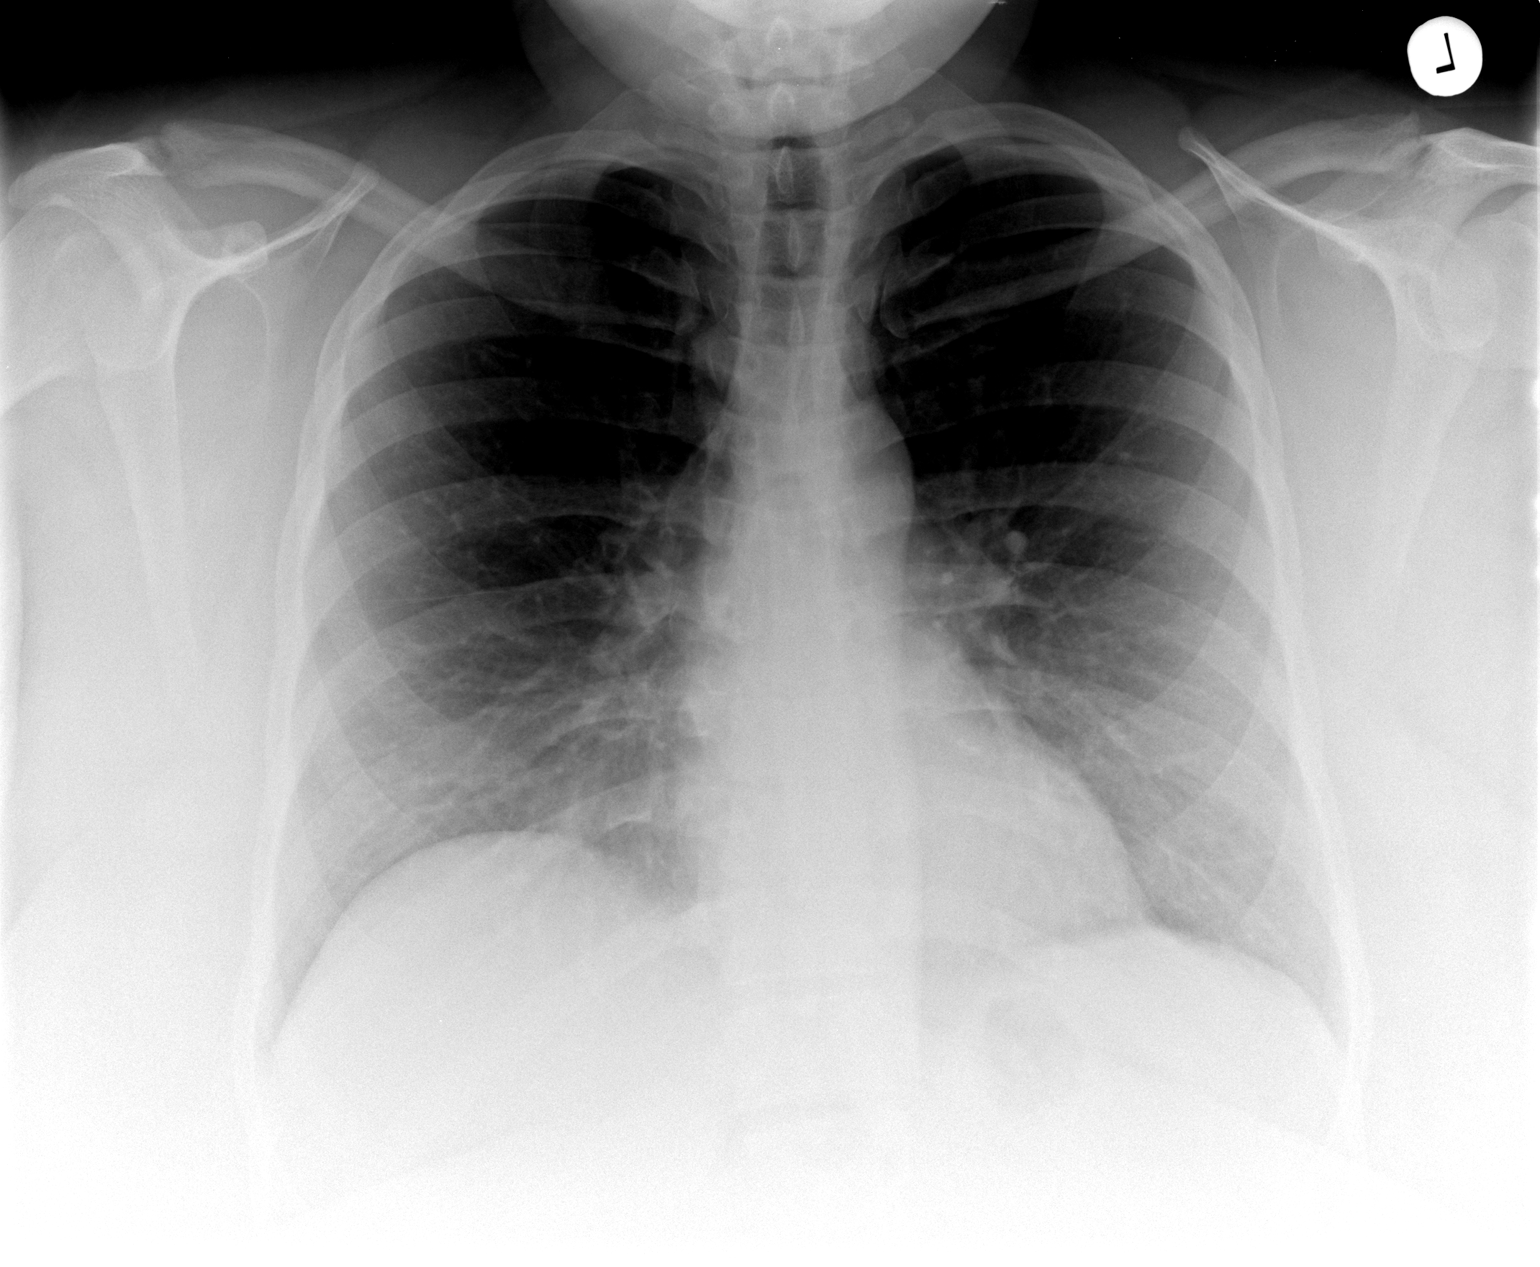

[view not recorded (2 of 2)]
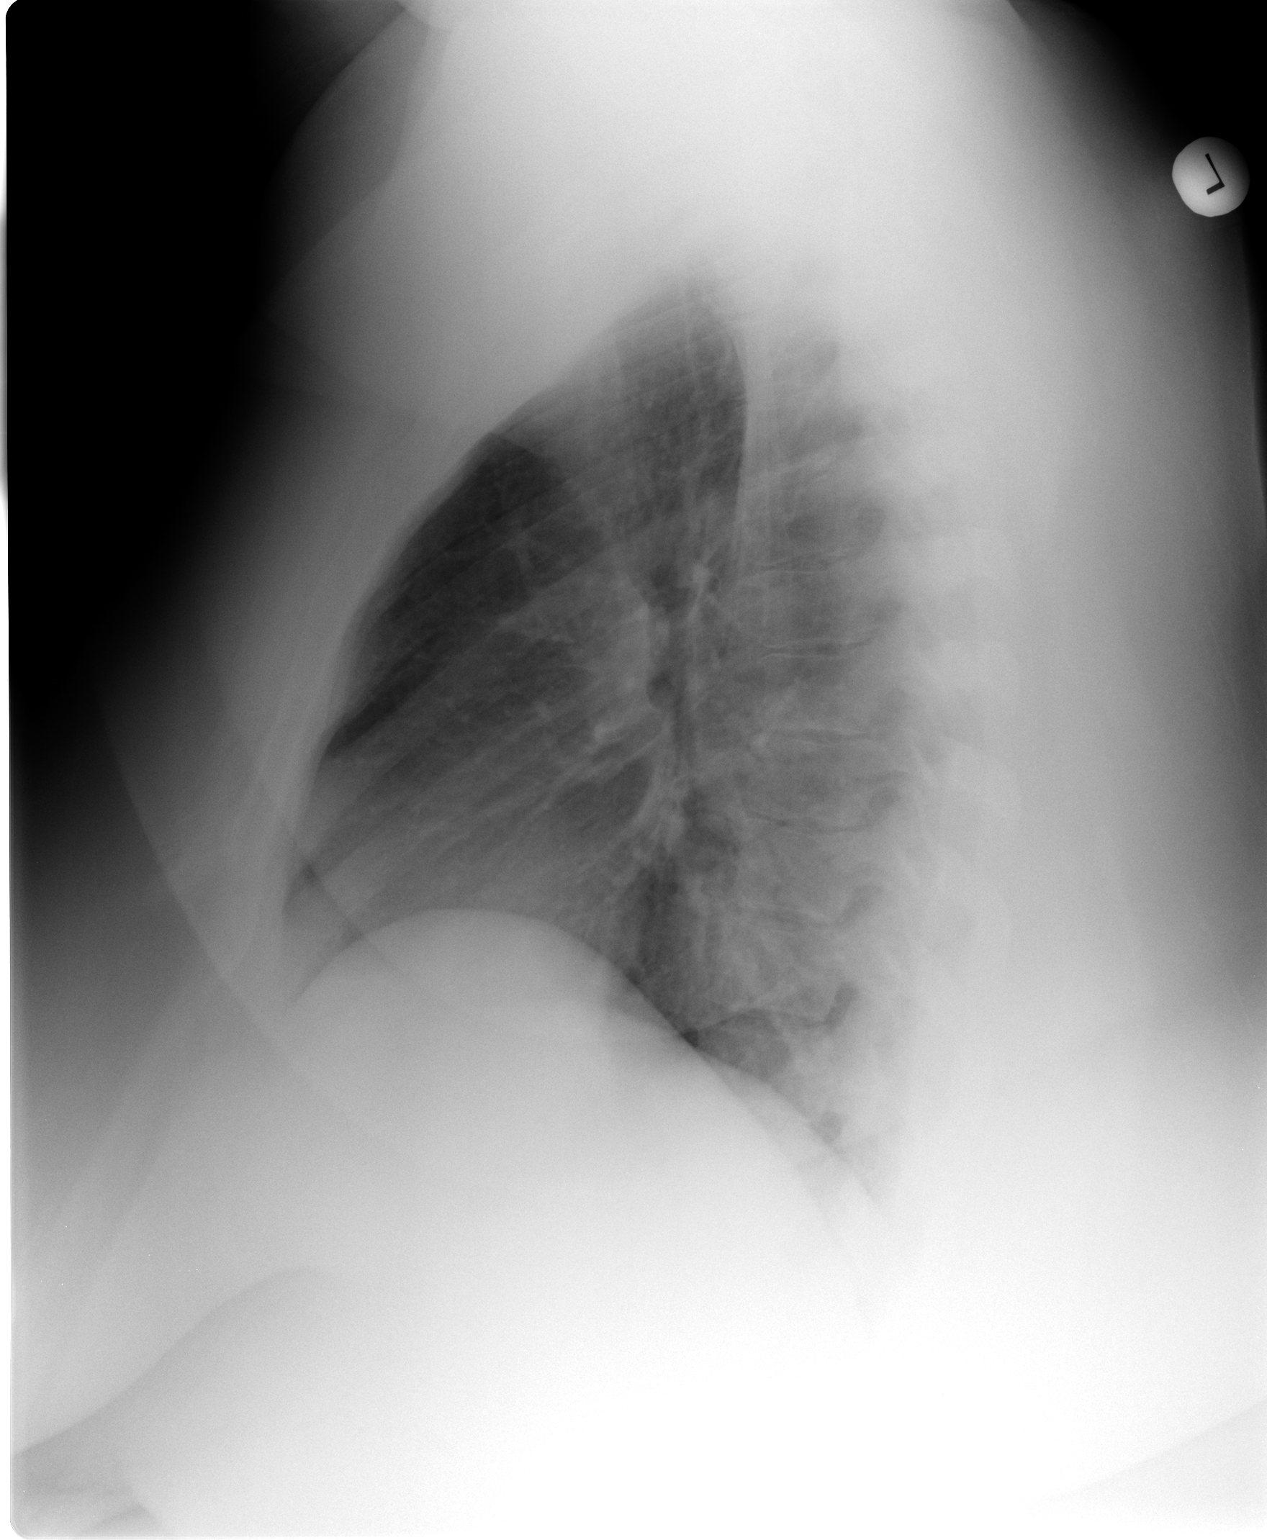

[2 of 2 positions shown; findings below may reference images not displayed]

FINDINGS: Heart and mediastinal contours are within normal limits.
The lung fields appear clear with no signs of focal infiltrate or
congestive failure.  No pleural fluid or significant peribronchial
cuffing is seen.

Bony structures appear intact.
IMPRESSION: Stable cardiopulmonary appearance with no new focal or acute
abnormality identified

## 2013-03-23 IMAGING — US US ABDOMEN COMPLETE
1 series · 13 of 25 positions shown · non-contrast
Comparison: CT [DATE].

CLINICAL DATA: Bariatric screening evaluation.

COMPLETE ABDOMINAL ULTRASOUND

[Series 1: us abdomen complete · 13 of 89 slices shown]
[im 1/89]
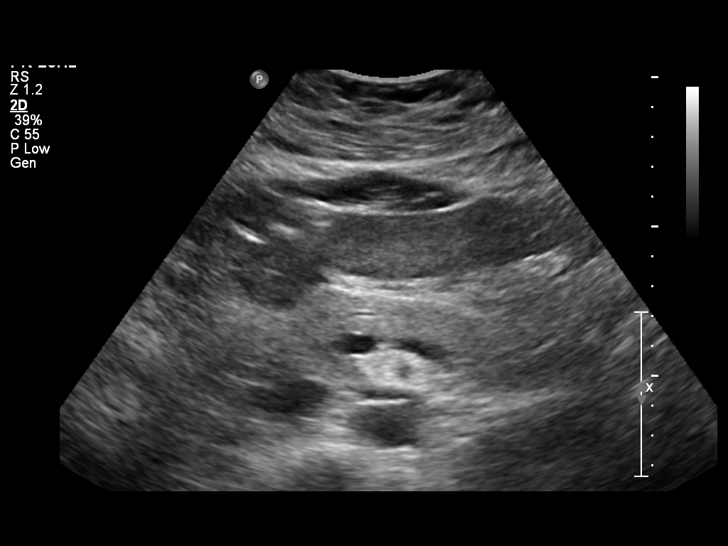
[im 8/89]
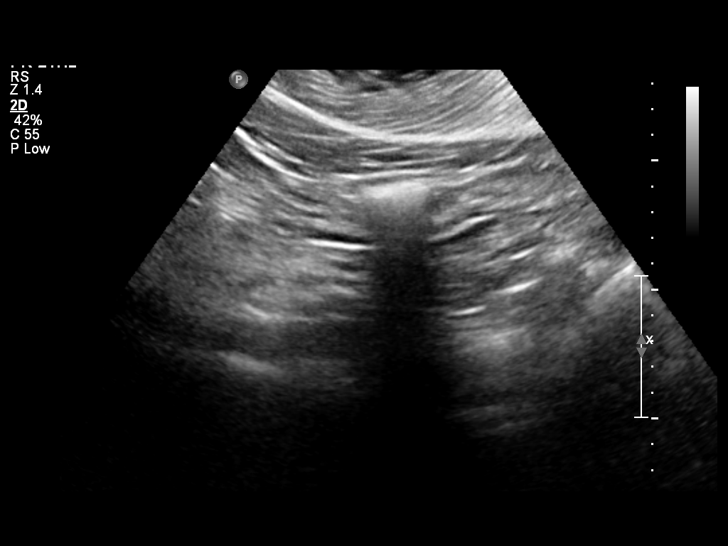
[im 15/89]
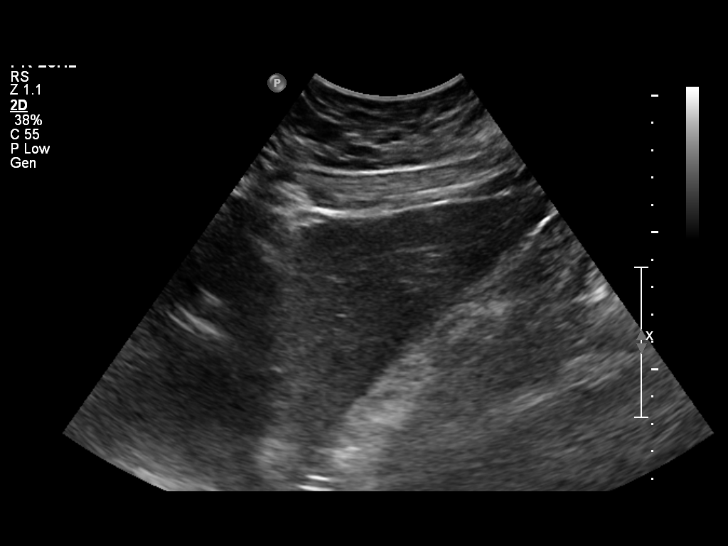
[im 23/89]
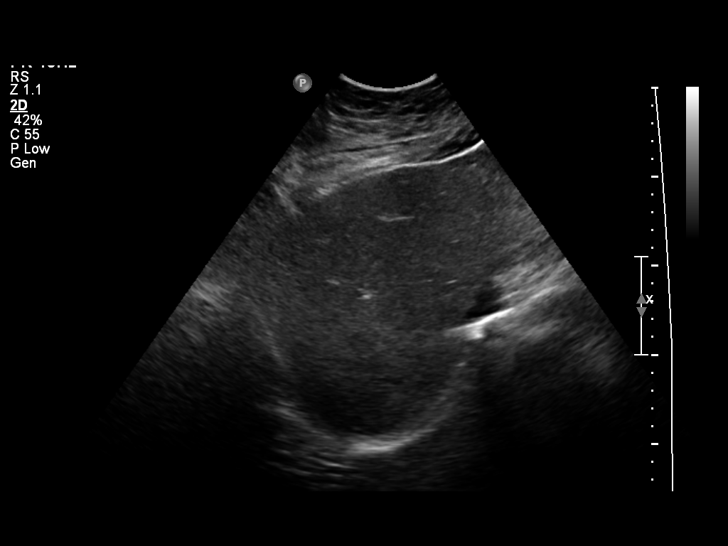
[im 30/89]
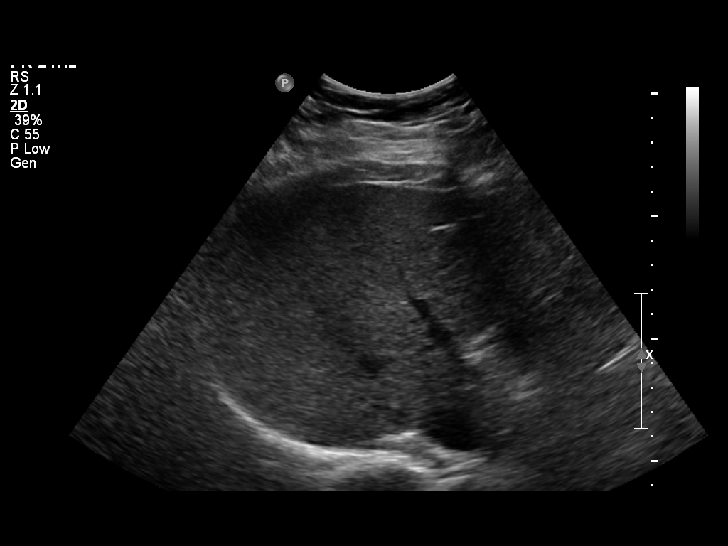
[im 37/89]
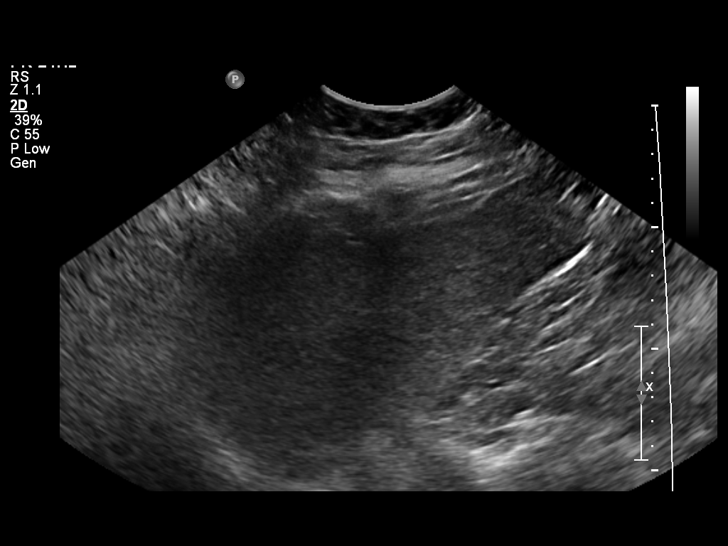
[im 45/89]
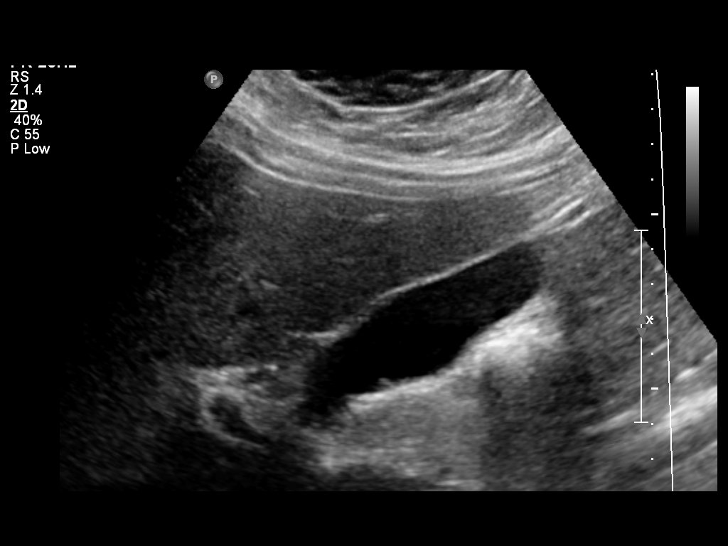
[im 52/89]
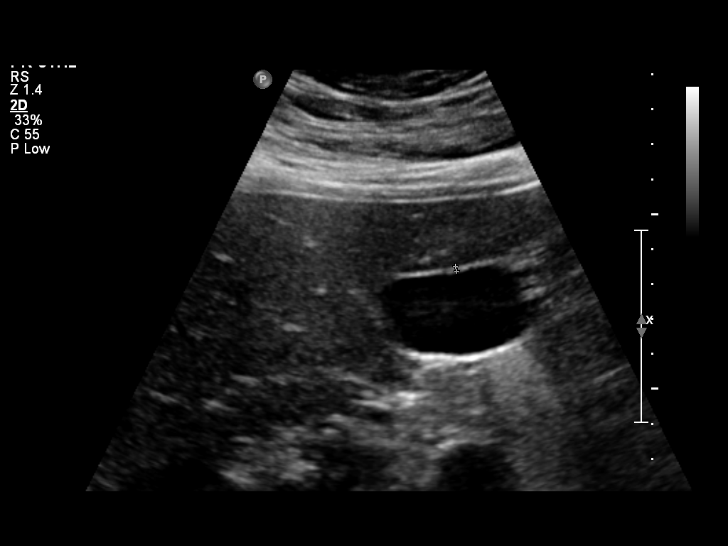
[im 59/89]
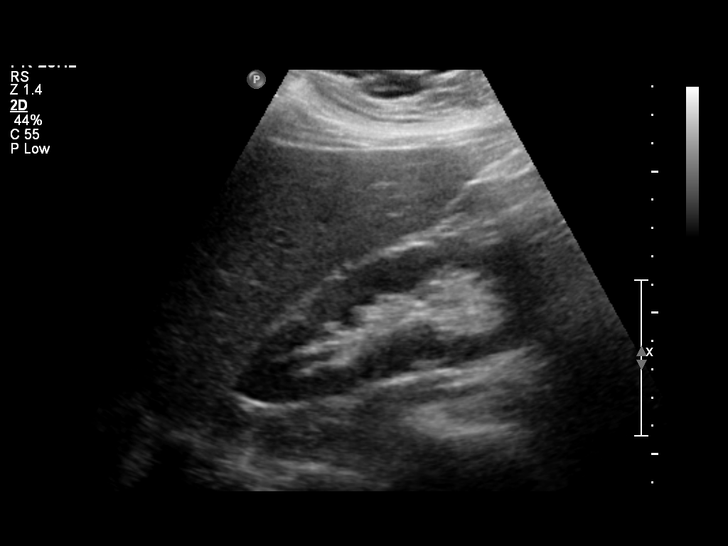
[im 67/89]
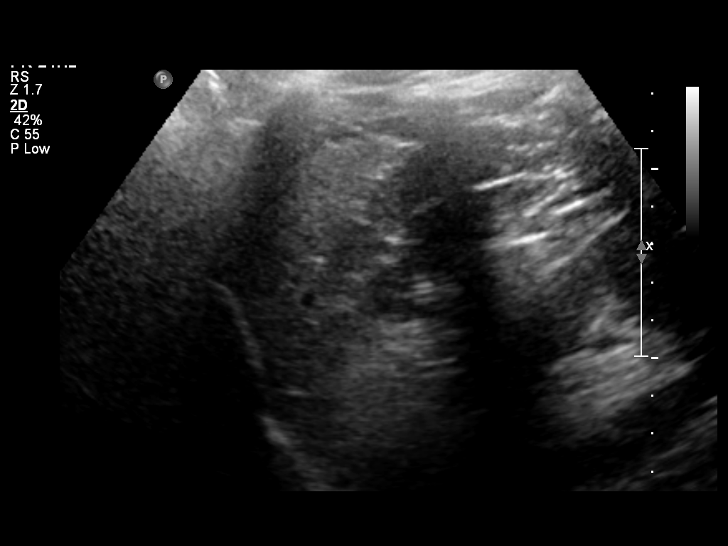
[im 74/89]
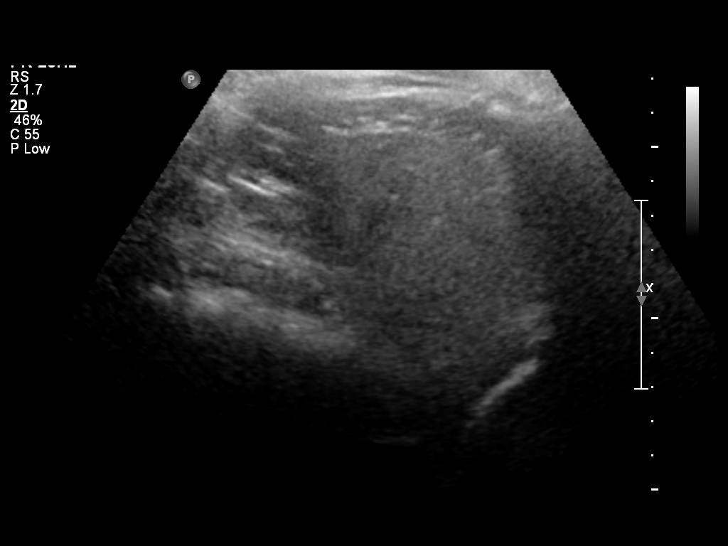
[im 81/89]
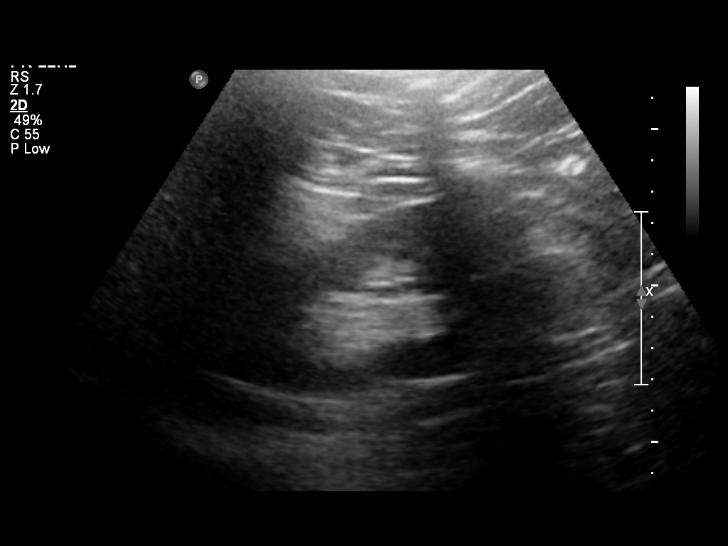
[im 89/89]
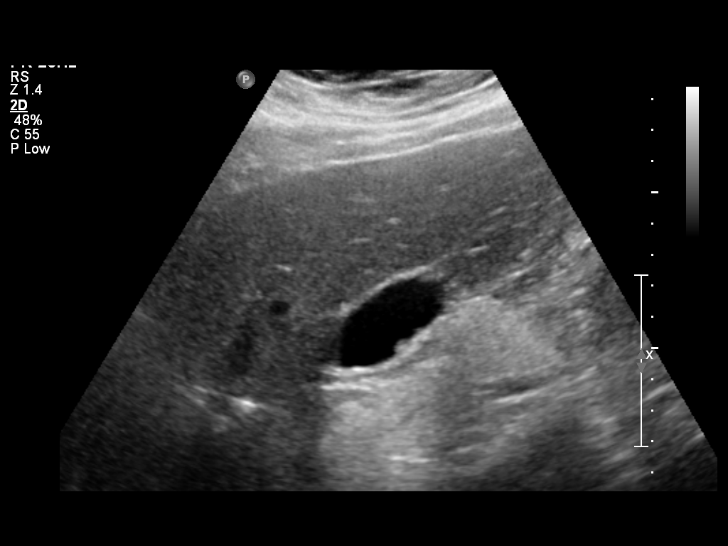

[13 of 25 positions shown; findings below may reference images not displayed]

FINDINGS: Overall scan resolution is diminished by patient body
habitus

Gallbladder:  Solitary nonshadowing, non mobile focus is identified
associated with the posterior gallbladder wall measuring 4.3 mm and
most compatible with a focal polyp.  No intraluminal stones or
sludge are apparent.  No pericholecystic fluid or gallbladder wall
thickening is seen.  Evaluation for a sonographic Murphy's sign is
negative

Common bile duct:  Has a diameter 3.8 mm and appears normal

Liver:  Slightly prominent sagittal length of 20.8 cm.  No focal
parenchymal abnormalities are seen and overall echotexture is
within normal limits

IVC:  The proximal portion appears normal

Pancreas:  The pancreatic head and body have a normal appearance.
The tail is obscured by overlying gas

Spleen:  Is difficult to visualize due to poor imaging
characteristics.  Has a sagittal length of 3.4 cm with no clearly
defined focal abnormalities

Right Kidney:  Has a sagittal length of 11 cm.  No focal
parenchymal abnormality or signs of hydronephrosis are seen

Left Kidney:  Overall resolution is compromised due to poor
scanning parameters.  Has a sagittal length of 10.4 cm with no
hydronephrosis.  No definite focal abnormalities are seen.

Abdominal aorta:  The visualized portion is normal in caliber with
a maximal width of 2.1 cm.  The distal aorta to the bifurcation was
obscured by gas.
IMPRESSION: Overall poor scan resolution in areas as noted above due to poor
imaging characteristics and patient body habitus.

Focal gallbladder polyp.  Mildly prominent hepatic size with no
focal abnormality suggested.

## 2013-04-02 ENCOUNTER — Emergency Department (HOSPITAL_COMMUNITY): Payer: Medicare Other

## 2013-04-02 ENCOUNTER — Encounter (HOSPITAL_COMMUNITY): Payer: Self-pay | Admitting: Emergency Medicine

## 2013-04-02 ENCOUNTER — Emergency Department (HOSPITAL_COMMUNITY)
Admission: EM | Admit: 2013-04-02 | Discharge: 2013-04-02 | Disposition: A | Discharge status: Home / Self Care | Payer: Medicare Other | Attending: Emergency Medicine | Admitting: Emergency Medicine

## 2013-04-02 DIAGNOSIS — M1710 Unilateral primary osteoarthritis, unspecified knee: Secondary | ICD-10-CM | POA: Insufficient documentation

## 2013-04-02 DIAGNOSIS — F411 Generalized anxiety disorder: Secondary | ICD-10-CM | POA: Insufficient documentation

## 2013-04-02 DIAGNOSIS — N390 Urinary tract infection, site not specified: Secondary | ICD-10-CM | POA: Insufficient documentation

## 2013-04-02 DIAGNOSIS — F329 Major depressive disorder, single episode, unspecified: Secondary | ICD-10-CM | POA: Insufficient documentation

## 2013-04-02 DIAGNOSIS — Z862 Personal history of diseases of the blood and blood-forming organs and certain disorders involving the immune mechanism: Secondary | ICD-10-CM | POA: Insufficient documentation

## 2013-04-02 DIAGNOSIS — Z79899 Other long term (current) drug therapy: Secondary | ICD-10-CM | POA: Insufficient documentation

## 2013-04-02 DIAGNOSIS — M545 Low back pain, unspecified: Secondary | ICD-10-CM | POA: Insufficient documentation

## 2013-04-02 DIAGNOSIS — F3289 Other specified depressive episodes: Secondary | ICD-10-CM | POA: Insufficient documentation

## 2013-04-02 DIAGNOSIS — K219 Gastro-esophageal reflux disease without esophagitis: Secondary | ICD-10-CM | POA: Insufficient documentation

## 2013-04-02 DIAGNOSIS — G8929 Other chronic pain: Secondary | ICD-10-CM | POA: Insufficient documentation

## 2013-04-02 DIAGNOSIS — I10 Essential (primary) hypertension: Secondary | ICD-10-CM | POA: Insufficient documentation

## 2013-04-02 LAB — BASIC METABOLIC PANEL
BUN: 7 mg/dL (ref 6–23)
CO2: 27 mEq/L (ref 19–32)
Calcium: 9.2 mg/dL (ref 8.4–10.5)
Chloride: 100 mEq/L (ref 96–112)
Creatinine, Ser: 0.69 mg/dL (ref 0.50–1.10)
GFR calc Af Amer: >90 mL/min (ref >90)
GFR calc non Af Amer: >90 mL/min (ref >90)
Glucose, Bld: 92 mg/dL (ref 70–99)
Potassium: 3.3 mEq/L — ABNORMAL LOW (ref 3.5–5.1)
Sodium: 135 mEq/L (ref 135–145)

## 2013-04-02 LAB — URINE MICROSCOPIC-ADD ON

## 2013-04-02 LAB — URINALYSIS, ROUTINE W REFLEX MICROSCOPIC
Bilirubin Urine: NEGATIVE
Glucose, UA: NEGATIVE mg/dL
Ketones, ur: NEGATIVE mg/dL
Leukocytes, UA: NEGATIVE
Nitrite: POSITIVE — AB
Protein, ur: NEGATIVE mg/dL
Specific Gravity, Urine: 1.027 (ref 1.005–1.030)
Urobilinogen, UA: 1 mg/dL (ref 0.0–1.0)
pH: 6 (ref 5.0–8.0)

## 2013-04-02 LAB — CBC
HCT: 38.5 % (ref 36.0–46.0)
Hemoglobin: 12.2 g/dL (ref 12.0–15.0)
MCH: 26.6 pg (ref 26.0–34.0)
MCHC: 31.7 g/dL (ref 30.0–36.0)
MCV: 83.9 fL (ref 78.0–100.0)
Platelets: 488 10*3/uL — ABNORMAL HIGH (ref 150–400)
RBC: 4.59 MIL/uL (ref 3.87–5.11)
RDW: 14.8 % (ref 11.5–15.5)
WBC: 9.2 10*3/uL (ref 4.0–10.5)

## 2013-04-02 MED ORDER — ONDANSETRON HCL 4 MG/2ML IJ SOLN
4.0000 mg | Freq: Once | INTRAMUSCULAR | Status: AC
  Administered 2013-04-02: 4 mg via INTRAVENOUS
  Filled 2013-04-02: qty 2

## 2013-04-02 MED ORDER — CEPHALEXIN 500 MG PO CAPS: 500.0000 mg | ORAL_CAPSULE | Freq: Four times a day (QID) | ORAL | Status: DC

## 2013-04-02 MED ORDER — CEFTRIAXONE SODIUM 1 G IJ SOLR: 1.0000 g | Freq: Once | INTRAMUSCULAR | Status: DC

## 2013-04-02 MED ORDER — IBUPROFEN 600 MG PO TABS: 600.0000 mg | ORAL_TABLET | Freq: Four times a day (QID) | ORAL | Status: DC | PRN

## 2013-04-02 MED ORDER — HYDROMORPHONE HCL PF 1 MG/ML IJ SOLN
1.0000 mg | Freq: Once | INTRAMUSCULAR | Status: AC
  Administered 2013-04-02: 1 mg via INTRAVENOUS
  Filled 2013-04-02: qty 1

## 2013-04-02 MED ORDER — CYCLOBENZAPRINE HCL 10 MG PO TABS: 10.0000 mg | ORAL_TABLET | Freq: Two times a day (BID) | ORAL | Status: DC | PRN

## 2013-04-02 MED ORDER — DEXTROSE 5 % IV SOLN
1.0000 g | Freq: Once | INTRAVENOUS | Status: AC
  Administered 2013-04-02: 1 g via INTRAVENOUS
  Filled 2013-04-02: qty 10

## 2013-04-02 MED ORDER — KETOROLAC TROMETHAMINE 30 MG/ML IJ SOLN
30.0000 mg | Freq: Once | INTRAMUSCULAR | Status: AC
  Administered 2013-04-02: 30 mg via INTRAVENOUS
  Filled 2013-04-02: qty 1

## 2013-04-02 MED ORDER — POTASSIUM CHLORIDE CRYS ER 20 MEQ PO TBCR
40.0000 meq | EXTENDED_RELEASE_TABLET | Freq: Once | ORAL | Status: AC
  Administered 2013-04-02: 40 meq via ORAL
  Filled 2013-04-02: qty 2

## 2013-04-02 NOTE — ED Provider Notes (Signed)
CSN: [id number]     Arrival date & time 04/02/13  0056 History   First MD Initiated Contact with Patient 04/02/13 0131     Chief Complaint  Patient presents with  . Back Pain   (Consider location/radiation/quality/duration/timing/severity/associated sxs/prior Treatment) HPI Hx per Patient - LBP x 5 days started out dull and now is more severe radiates to R flank and RLQ, feel like a kidney stone. No trauma. Does have chronic LBP that radiates to her legs at times. No weakness or numbness, no hematuria, no dysuria. No F/C. Pain mod to severe.   Past Medical History  Diagnosis Date  . Anemia     iron def  . Hypertension   . Morbid obesity   . GERD (gastroesophageal reflux disease)   . Anxiety   . Depression   . Migraine   . Back pain   . Chronic knee pain   . Arthritis     left knee   . Hx of laparoscopic gastric banding   . Anginal pain    Past Surgical History  Procedure Laterality Date  . Abdominal hysterectomy    . Knee arthroscopy    . Cesarean section  05/1991  . Breath tek h pylori  07/28/2011    Procedure: BREATH TEK H PYLORI;  Surgeon: Valarie Merino, MD;  Location: [facility];  Service: General;  Laterality: N/A;  to be done at 745  . Esophageal biopsy  02/16/2012    Procedure: BIOPSY;  Surgeon: Valarie Merino, MD;  Location: [facility];  Service: General;;  biopsy of mass x 2  . Eus  03/03/2012    Procedure: UPPER ENDOSCOPIC ULTRASOUND (EUS) LINEAR;  Surgeon: Rachael Fee, MD;  Location: [facility];  Service: Endoscopy;  Laterality: N/A;  . Laparoscopic gastric banding    . Tubal ligation    . Laparoscopic gastrectomy  04/08/2012    Procedure: LAPAROSCOPIC GASTRECTOMY;  Surgeon: Valarie Merino, MD;  Location: [facility];  Service: General;;  removal of GIST tumor of stomach  . Laparoscopic gastric sleeve resection N/A 08/29/2012    Procedure: LAPAROSCOPIC GASTRIC SLEEVE RESECTION;  Surgeon: Valarie Merino, MD;  Location: [facility];  Service: General;  Laterality:  N/A;  Sleeve Gastrectomy  . Esophagogastroduodenoscopy N/A 08/29/2012    Procedure: ESOPHAGOGASTRODUODENOSCOPY (EGD);  Surgeon: Valarie Merino, MD;  Location: [facility];  Service: General;  Laterality: N/A;   Family History  Problem Relation Age of Onset  . Diabetes Mother   . Cancer Father     brain tumor, colon  . Diabetes Father   . Heart disease Father   . Heart disease Paternal Uncle   . Diabetes Paternal Uncle   . Diabetes Maternal Grandmother   . Heart disease Paternal Grandmother   . Heart disease Paternal Grandfather   . Diabetes Maternal Aunt   . Diabetes Maternal Uncle   . Diabetes Paternal Aunt   . Diabetes Maternal Grandfather    History  Substance Use Topics  . Smoking status: Never Smoker   . Smokeless tobacco: Never Used  . Alcohol Use: Yes     Comment: maybe once a month   OB History   Grav Para Term Preterm Abortions TAB SAB Ect Mult Living                 Review of Systems  Constitutional: Negative for fever and chills.  Eyes: Negative for pain.  Respiratory: Negative for shortness of breath.   Cardiovascular: Negative for chest pain.  Gastrointestinal: Negative for abdominal pain.  Genitourinary: Negative for dysuria.  Musculoskeletal: Positive for back pain. Negative for neck pain and neck stiffness.  Skin: Negative for rash.  Neurological: Negative for headaches.  All other systems reviewed and are negative.    Allergies  Codeine  Home Medications   Current Outpatient Rx  Name  Route  Sig  Dispense  Refill  . ALPRAZolam (XANAX) 1 MG tablet   Oral   Take 1 mg by mouth 2 (two) times daily as needed for anxiety.          . divalproex (DEPAKOTE SPRINKLE) 125 MG capsule   Oral   Take 500-1,000 mg by mouth 2 (two) times daily. Sprinkle kind of medication - patient takes 8 caps. every nite and then alternating 4 caps. and 8 caps. every other day in the am ( patient states there are 125 mg in each packet )         . EXPIRED: fluticasone  (FLONASE) 50 MCG/ACT nasal spray   Nasal   Place 2 sprays into the nose daily.         Marland Kitchen lamoTRIgine (LAMICTAL) 100 MG tablet   Oral   Take 100 mg by mouth at bedtime.         Marland Kitchen lisinopril (PRINIVIL,ZESTRIL) 10 MG tablet   Oral   Take 1 tablet (10 mg total) by mouth daily.   90 tablet   3   . loratadine (CLARITIN) 10 MG tablet   Oral   Take 1 tablet (10 mg total) by mouth daily.   30 tablet   11   . nystatin cream (MYCOSTATIN)   Topical   Apply 1 application topically 2 (two) times daily.   30 g   5   . omeprazole (PRILOSEC) 40 MG capsule   Oral   Take 40 mg by mouth daily.         Marland Kitchen oxyCODONE-acetaminophen (PERCOCET) 7.5-325 MG per tablet   Oral   Take 1 tablet by mouth every 6 (six) hours as needed for pain.   60 tablet   0   . promethazine (PHENERGAN) 25 MG tablet   Oral   Take 25 mg by mouth every 6 (six) hours as needed for nausea.          Marland Kitchen tiZANidine (ZANAFLEX) 4 MG tablet   Oral   Take 1 tablet (4 mg total) by mouth every 6 (six) hours as needed. Muscle spasm   30 tablet   2   . triamcinolone ointment (KENALOG) 0.1 %   Topical   Apply 1 application topically 2 (two) times daily as needed. Applies to left arm   30 g   3    BP 130/58  Pulse 76  Temp(Src) 97.6 F (36.4 C) (Oral)  Resp 18  Ht 5\' 7"  (1.702 m)  Wt 320 lb (145.151 kg)  BMI 50.11 kg/m2  SpO2 100% Physical Exam  Constitutional: She is oriented to person, place, and time. She appears well-developed and well-nourished.  HENT:  Head: Normocephalic and atraumatic.  Eyes: EOM are normal. Pupils are equal, round, and reactive to light.  Neck: Neck supple.  Cardiovascular: Normal rate, regular rhythm and intact distal pulses.   Pulmonary/Chest: Effort normal and breath sounds normal. No respiratory distress.  Abdominal: Soft. Bowel sounds are normal. She exhibits no distension. There is no rebound and no guarding.  TTP R flank and RLQ  Musculoskeletal: Normal range of motion.  She exhibits no edema.  No  LE deficits equal DTRs, strengths and sensory to light touch.  Has tenderness lumbar and paralumbar spine, no deformity  Neurological: She is alert and oriented to person, place, and time.  Skin: Skin is warm and dry.    ED Course  Procedures (including critical care time) Labs Review Labs Reviewed  CBC - Abnormal; Notable for the following:    Platelets 488 (*)    All other components within normal limits  BASIC METABOLIC PANEL - Abnormal; Notable for the following:    Potassium 3.3 (*)    All other components within normal limits  URINALYSIS, ROUTINE W REFLEX MICROSCOPIC - Abnormal; Notable for the following:    APPearance CLOUDY (*)    Hgb urine dipstick TRACE (*)    Nitrite POSITIVE (*)    All other components within normal limits  URINE MICROSCOPIC-ADD ON - Abnormal; Notable for the following:    Squamous Epithelial / LPF FEW (*)    Bacteria, UA MANY (*)    All other components within normal limits  URINE CULTURE   Imaging Review Ct Abdomen Pelvis Wo Contrast  04/02/2013   CLINICAL DATA:  Low back pain and bilateral leg pain for 1 day. Right flank pain. History of chronic back pain.  EXAM: CT ABDOMEN AND PELVIS WITHOUT CONTRAST  TECHNIQUE: Multidetector CT imaging of the abdomen and pelvis was performed following the standard protocol without intravenous contrast.  COMPARISON:  02/16/2012  FINDINGS: Mild dependent changes in the lung bases. Postoperative changes in the stomach consistent with history of gastric banding.  The kidneys appear symmetrical in size and shape. No pyelocaliectasis or ureterectasis. No renal, ureteral, or bladder stones. The bladder is decompressed.  The unenhanced appearance of the liver, spleen, gallbladder, pancreas, adrenal glands, abdominal aorta, inferior vena cava, and retroperitoneal lymph nodes is unremarkable. The stomach, small bowel, and colon are decompressed. Scattered stool in the colon. No free air or free fluid in  the abdomen. Small broad-based umbilical hernia containing fat is stable.  Pelvis: Scarring in the anterior abdominal wall subcutaneous fat consistent with previous postoperative change and stable. Uterus is surgically absent. No abnormal adnexal masses. Rectosigmoid colon is decompressed without inflammatory change. The appendix is normal. Mild prominence of right lower quadrant lymph nodes, likely within normal limits and stable since previous study. No free or loculated pelvic fluid collections. Normal alignment of the lumbar spine. No destructive bone lesions.  IMPRESSION: No renal or ureteral stone or obstruction. No acute inflammatory process suggested.   Electronically Signed   By: Burman Nieves M.D.   On: 04/02/2013 02:38   Dg Lumbar Spine Complete  04/02/2013   CLINICAL DATA:  Severe low back pain radiating to both legs.  EXAM: LUMBAR SPINE - COMPLETE 4+ VIEW  COMPARISON:  CT abdomen and pelvis 02/16/2012  FINDINGS: There is no evidence of lumbar spine fracture. Alignment is normal. Intervertebral disc spaces are maintained.  IMPRESSION: Negative.   Electronically Signed   By: Burman Nieves M.D.   On: 04/02/2013 01:42   IV Dilaudid, zofran IV rocephin NIT positive UTI  Labs reviewed in potassium provided.  On recheck pain improving. Plan discharge home with outpatient followup. Prescription and precautions provided. Patient is a reliable historian and agrees to all discharge and followup instructions MDM  Diagnosis: Low back pain, UTI  Evaluated with urinalysis, labs and imaging reviewed as above - normal appendix and no ureteral stone. Mild hypokalemia corrected with by mouth potassium. Pain treated with IV narcotics and medications IV antibiotics  for UTI Old records, vital signs and nursing notes reviewed and considered   Sunnie Nielsen, MD 04/02/13 [phone]

## 2013-04-02 NOTE — ED Notes (Signed)
Pt c/o low back pain radiating around to abdomen, pt states this is different than chronic pain.

## 2013-04-02 NOTE — Progress Notes (Signed)
Pt here with c/o low back pain that radiates into both legs. Pain began yesterday morning and worsened throughout the day and evening. Admits to having to use a walker because the pain was so severe. Pt is prescribed oxycodone for chronic back pain and last took the medication at 1700 yesterday. Rates current pain 10/10

## 2013-04-04 LAB — URINE CULTURE: Colony Count: >=100000

## 2013-04-05 ENCOUNTER — Encounter: Payer: Self-pay | Admitting: Family Medicine

## 2013-04-05 ENCOUNTER — Ambulatory Visit (INDEPENDENT_AMBULATORY_CARE_PROVIDER_SITE_OTHER): Payer: Medicare Other | Admitting: Family Medicine

## 2013-04-05 VITALS — BP 121/79 | HR 74 | Temp 97.9°F | Ht 67.0 in | Wt 317.0 lb

## 2013-04-05 DIAGNOSIS — N390 Urinary tract infection, site not specified: Secondary | ICD-10-CM | POA: Insufficient documentation

## 2013-04-05 DIAGNOSIS — M549 Dorsalgia, unspecified: Secondary | ICD-10-CM

## 2013-04-05 NOTE — Assessment & Plan Note (Signed)
Mildly worsened with recent UTI but no red flag symptoms. Recent ED visit CT abdomen/pelvis with no stone or acute inflammatory process. - Continue keflex for UTI. - Return 12/10 to f/u back pain with Dr Mikel Cella. - Continue seeing [facility] who is now prescribing all pain medications (current regimen of tizanidine, percocet, and ibuprofen.) - Return precautions reviewed (return of fever, red flag symptoms of weakness, perineal numbness, or incontinence, or othe concerning symptoms).

## 2013-04-05 NOTE — Patient Instructions (Signed)
It was good to meet you today. Sorry your back is hurting.  Continue keflex for full 7 day course (ending Sat). Come back as scheduled 12/10 for follow up of back pain with Dr. Mikel Cella. If still burning when you pee, let Dr Mikel Cella know and she may decide to run another urinalysis when you come back.  Return sooner or seek immediate care in the ED if you have return of fever, red flag symptoms of weakness, perineal numbness, or incontinence, or othe concerning symptoms. Continue following with [facility] who is now prescribing all pain medications.  Leona Singleton, MD

## 2013-04-05 NOTE — Progress Notes (Signed)
Patient ID: Crystal Garza, female   DOB: [birth year], 42 y.o.   MRN: [id number]  CC: Follow up back pain  Subjective:  1. Chronic back pain with intermittent exacerbations - Pt is a 42 y.o. female with obesity-related low back pain. Current symptoms include: pain in lower back (aching, dull and throbbing in character; 7/10 in severity) and stiffness and tingling in low back. Denies weakness, perineal numbness, or incontinence. Symptoms have worsened from the previous visit. Exacerbating factors identified by the patient are sitting, walking and laying down flat, with laying being the worse aggravator. Denies numbness/tingling/symptoms shooting down legs.  Pt was recently in the ED 11/30 with severe back pain found to have UTI. She was given keflex for 7day course. She has been taking this as directed. She was also given ibuprofen for pain to take with her percocet and muscle relaxer. She thinks this has helped. She is taking ibuprofen daily, percocet four times daily, and tizanidine BID. She has started seeing [facility] clinic who now prescribes all her medications and also referred her to chiropractic clinic where she was seen today for the first time and will return tomorrow and Friday.  She currently denies fevers, chills, nausea, vomiting, diarrhea. She reports continued dysuria. She reports back pain has not improved. She reports some diarrhea 2 days ago that has resolved.   Review of Systems - Per HPI.  PMH: The following portions of the patient's history were reviewed and updated as appropriate: allergies, current medications, past family history, past medical history, past social history, past surgical history and problem list.  - Getting all pain medicine through pain clinic ([facility]). - Medications reviewed.      Objective:  Physical Exam BP 121/79  Pulse 74  Temp(Src) 97.9 F (36.6 C) (Oral)  Ht 5\' 7"  (1.702 m)  Wt 317 lb (143.79 kg)  BMI 49.64  kg/m2 GEN: NAD, obese, well-developed, pleasant HEENT: Atraumatic, normocephalic, neck supple, EOMI, sclera clear  CV: RRR, no murmurs, rubs, or gallops PULM: CTAB, normal effort ABD: Soft, nontender, nondistended, no suprapubic tenderness, no organomegaly BACK: Gait hesitant with stiff back, flexion to 30 degrees and extension past 0 degrees limited by pain, no erythema or deformity, tenderness diffusely over low back (over spinous process and paraspinal muscles). No CVA tenderness. SKIN: No rash or cyanosis; warm and well-perfused PSYCH: Mood and affect euthymic, normal rate and volume of speech NEURO: Awake, alert, no focal deficits grossly, normal speech    Assessment:     Crystal Garza is a 42 y.o. female with h/o low back pain related to obesity.    Plan:     # See problem list and after visit summary for problem-specific plans.  # Health Maintenance: Not addressed.  Follow-up: Follow up as scheduled 12/10 for PCP visit for follow up.   Leona Singleton, MD [facility]

## 2013-04-05 NOTE — Assessment & Plan Note (Signed)
Dx'ed at ED, on keflex, Dysuria still present, but only 3 days into abx. Sn to cephazolin. Afebrile and symptoms not worsened. - Continue abx for full 7 day course (ending Sat). - If still burning when you pee, let Dr Mikel Cella know and she may decide to run another urinalysis.

## 2013-04-12 ENCOUNTER — Encounter: Payer: Self-pay | Admitting: Family Medicine

## 2013-04-12 ENCOUNTER — Ambulatory Visit (INDEPENDENT_AMBULATORY_CARE_PROVIDER_SITE_OTHER): Payer: Medicare Other | Admitting: Surgery

## 2013-04-12 ENCOUNTER — Encounter (INDEPENDENT_AMBULATORY_CARE_PROVIDER_SITE_OTHER): Payer: Self-pay | Admitting: Surgery

## 2013-04-12 ENCOUNTER — Ambulatory Visit (INDEPENDENT_AMBULATORY_CARE_PROVIDER_SITE_OTHER): Payer: Medicare Other | Admitting: Family Medicine

## 2013-04-12 VITALS — BP 123/84 | HR 80 | Temp 98.1°F | Ht 67.0 in | Wt 316.6 lb

## 2013-04-12 VITALS — BP 139/83 | HR 77 | Temp 98.4°F | Ht 67.0 in | Wt 315.0 lb

## 2013-04-12 DIAGNOSIS — Z13220 Encounter for screening for lipoid disorders: Secondary | ICD-10-CM

## 2013-04-12 DIAGNOSIS — M549 Dorsalgia, unspecified: Secondary | ICD-10-CM

## 2013-04-12 DIAGNOSIS — Z903 Acquired absence of stomach [part of]: Secondary | ICD-10-CM

## 2013-04-12 DIAGNOSIS — N390 Urinary tract infection, site not specified: Secondary | ICD-10-CM

## 2013-04-12 DIAGNOSIS — R319 Hematuria, unspecified: Secondary | ICD-10-CM

## 2013-04-12 DIAGNOSIS — Z9884 Bariatric surgery status: Secondary | ICD-10-CM

## 2013-04-12 LAB — POCT UA - MICROSCOPIC ONLY

## 2013-04-12 LAB — POCT URINALYSIS DIPSTICK
Bilirubin, UA: NEGATIVE
Glucose, UA: NEGATIVE
Ketones, UA: NEGATIVE
Leukocytes, UA: NEGATIVE
Nitrite, UA: NEGATIVE
Protein, UA: NEGATIVE
Spec Grav, UA: 1.02
Urobilinogen, UA: 2
pH, UA: 8

## 2013-04-12 NOTE — Progress Notes (Signed)
Crystal Garza 42 y.o.  Body mass index is 49.57 kg/(m^2).  Patient Active Problem List   Diagnosis Date Noted  . UTI (urinary tract infection) 04/05/2013  . Screening for malignant neoplasm of the cervix 10/27/2012  . 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 Dysuria 05/31/2012  . Rotator cuff syndrome of left shoulder 04/22/2012  . 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 02/17/2012  . Bipolar disorder 01/19/2012  . Encounter for annual health examination 11/04/2011  . Peripheral edema 08/27/2011  . Skin yeast infection 08/25/2011  . Need for prophylactic vaccination and inoculation against influenza 06/02/2011  . Nephrolithiasis 02/20/2011  . Nocturia 01/22/2011  . Arm paresthesia, left 12/30/2010  . Back pain 10/22/2010  . Eczematous dermatitis 10/22/2010  . Iron deficiency anemia 06/27/2010  . Anxiety 06/27/2010  . MIGRAINE, CHRONIC 09/12/2009  . Morbid obesity-BMI 62 10/30/2008  . OSTEOARTHRITIS, KNEES, BILATERAL 07/05/2007  . DEPRESSION, MAJOR, RECURRENT 07/01/2006  . HYPERTENSION, BENIGN SYSTEMIC 07/01/2006  . RHINITIS, ALLERGIC 07/01/2006  . GASTROESOPHAGEAL REFLUX, NO ESOPHAGITIS 07/01/2006    Allergies  Allergen Reactions  . Codeine Rash    Past Surgical History  Procedure Laterality Date  . Abdominal hysterectomy    . Knee arthroscopy    . Cesarean section  05/1991  . Breath tek h pylori  07/28/2011    Procedure: BREATH TEK H PYLORI;  Surgeon: Valarie Merino, MD;  Location: [facility];  Service: General;  Laterality: N/A;  to be done at 745  . Esophageal biopsy  02/16/2012    Procedure: BIOPSY;  Surgeon: Valarie Merino, MD;  Location: [facility];  Service: General;;  biopsy of mass x 2  . Eus  03/03/2012    Procedure: UPPER ENDOSCOPIC ULTRASOUND (EUS) LINEAR;  Surgeon: Rachael Fee, MD;  Location: [facility];  Service: Endoscopy;  Laterality: N/A;  . Laparoscopic gastric banding    . Tubal  ligation    . Laparoscopic gastrectomy  04/08/2012    Procedure: LAPAROSCOPIC GASTRECTOMY;  Surgeon: Valarie Merino, MD;  Location: [facility];  Service: General;;  removal of GIST tumor of stomach  . Laparoscopic gastric sleeve resection N/A 08/29/2012    Procedure: LAPAROSCOPIC GASTRIC SLEEVE RESECTION;  Surgeon: Valarie Merino, MD;  Location: [facility];  Service: General;  Laterality: N/A;  Sleeve Gastrectomy  . Esophagogastroduodenoscopy N/A 08/29/2012    Procedure: ESOPHAGOGASTRODUODENOSCOPY (EGD);  Surgeon: Valarie Merino, MD;  Location: [facility];  Service: General;  Laterality: N/A;   [name], MD No diagnosis found.  [name] has lost 58 pounds since her surgery done in April 2014. She has lost about 13 pounds since her last office visit. Weight loss and an old slow but persistent. I advised her to stay off carbs and she is really trying to make some changes there. Otherwise she is doing well. I will see her again in April which will be her 1 year anniversary. [name], MD, [facility] [phone] beeper [phone]  04/12/2013 2:25 PM

## 2013-04-12 NOTE — Patient Instructions (Signed)
Reduce the carbs in your diet

## 2013-04-12 NOTE — Progress Notes (Signed)
Patient ID: Crystal Garza, female   DOB: [birth year], 42 y.o.   MRN: [id number]    Subjective: HPI: Patient is a 42 y.o. female presenting to clinic today for follow up appointment. Concerns today include back pain/UTI and cholesterol screen  1. Back pain- being followed by back pain specialist. States she has problems with L4-L5, but current pain is coming around right side into lower abdomen. She states it feels like her kidney stones but CT scan was negative. Currently her urine is very strong odor. She was on Keflex until 4 days ago. She reports urinary frequency and pain after she urinates. She has had 2-3 UTI recently.   2. Screening labs- Would like to be screened for cholesterol. Last LDL was in July 2013 and was <100.  History Reviewed: Non-smoker. Health Maintenance: UTD  ROS: Please see HPI above.  Objective: Office vital signs reviewed. BP 139/83  Pulse 77  Temp(Src) 98.4 F (36.9 C) (Oral)  Ht 5\' 7"  (1.702 m)  Wt 315 lb (142.883 kg)  BMI 49.32 kg/m2  Physical Examination:  General: Awake, alert. NAD.  HEENT: Atraumatic, normocephalic. MMM Pulm: CTAB, no wheezes Cardio: RRR, no murmurs appreciated Abdomen: Obese, TTP right flank (in midaxillary line), but no suprapubic tenderness. Back: TTP lumbar spine and paraspinal muscles Neuro: Grossly intact  Assessment: 42 y.o. female with back pain  Plan: See Problem List and After Visit Summary

## 2013-04-12 NOTE — Assessment & Plan Note (Signed)
UA not suggestive of UTI at this time, but does have hematuria. Given her frequent UTI and now with microhematuria, will refer to urology for further evaluation. Drink more water. Con't pain control per pain management. Patient agrees with this plan.

## 2013-04-12 NOTE — Patient Instructions (Signed)
We will get you in to see a urologist to be evaluated for your pain and blood in the urine. It could be from a small kidney stone that we cannot see but I would like for them to evaluate you. Drink cranberry juice and lots of water for now.  I have also put in an order for you to come back one morning to get your labs checked. You can make this appointment whenever it is convenient for you.  Amber M. Hairford, M.D.

## 2013-06-09 ENCOUNTER — Ambulatory Visit: Payer: Medicare Other | Admitting: Family Medicine

## 2013-06-12 ENCOUNTER — Encounter: Payer: Self-pay | Admitting: Family Medicine

## 2013-06-12 ENCOUNTER — Ambulatory Visit (INDEPENDENT_AMBULATORY_CARE_PROVIDER_SITE_OTHER): Payer: Medicare Other | Admitting: Family Medicine

## 2013-06-12 VITALS — BP 134/70 | HR 79 | Temp 98.6°F | Ht 67.0 in | Wt 313.0 lb

## 2013-06-12 DIAGNOSIS — G894 Chronic pain syndrome: Secondary | ICD-10-CM

## 2013-06-12 DIAGNOSIS — G8929 Other chronic pain: Secondary | ICD-10-CM

## 2013-06-12 MED ORDER — OXYCODONE-ACETAMINOPHEN 7.5-325 MG PO TABS: 1.0000 | ORAL_TABLET | Freq: Four times a day (QID) | ORAL | Status: DC | PRN

## 2013-06-12 NOTE — Patient Instructions (Signed)
I have put in a new referral for ortho. You can call and make an appointment with Dr. Ernestina Patches. I will prescribe your pain medications under a new pain contract. Hopefully with time we can decrease your medication.  I will see you back in a few months for your normal follow up.  Amber M. Hairford, M.D.

## 2013-06-13 DIAGNOSIS — G8929 Other chronic pain: Secondary | ICD-10-CM | POA: Insufficient documentation

## 2013-06-13 NOTE — Assessment & Plan Note (Signed)
Discussed pros/cons of pain management at [facility] with patient. I truly think she benefits from the medication but I have discussed the goal is to wean overall medication use with time and she agrees. Pain contract signed. Given Rx for Percocet 7.5/325mg  #120/0R and patient will need to let us know in advance when she needs refills. No emergency Rx will be written or early refills given. Patient understands the terms of the [facility] pain contract.

## 2013-06-13 NOTE — Progress Notes (Signed)
Patient ID: Crystal Garza, female   DOB: [birth year], 43 y.o.   MRN: [id number]    Subjective: HPI: Patient is a 43 y.o. female presenting to clinic today for chronic pain.  Patient has been followed by [facility] for her knee pain and back pain. They referred her to [facility] to manage her pain medication. She was arranged to have epidural injections at the pain clinic rather than with Dr. Oletta Lamas at [place]. The doctor doing her injections did not use fluoroscopy which caused her more pain and had to do a blood patch on her. She states he was very rude and she would rather not go back there. Dr. Oletta Lamas has agreed to con't with her epidural injections but not manage her pain. Therefore she is here today to request our clinic do her pain medication so she does not have to go to pain clinic again.  She has been on pain contract here before and been completely compliant. I do not feel she is seeking excessive medications. Due to her weight, she does have severe OA of her knees and back pain. She has had bariatric surgery and continues to lose weight but her functional status is still low, especially without pain medication.   History Reviewed: Non smoker.  ROS: Please see HPI above.  Objective: Office vital signs reviewed. BP 134/70  Pulse 79  Temp(Src) 98.6 F (37 C) (Oral)  Ht 5\' 7"  (1.702 m)  Wt 313 lb (141.976 kg)  BMI 49.01 kg/m2  Physical Examination:  General: Awake, alert. NAD HEENT: Atraumatic, normocephalic Pulm: CTAB, no wheezes Cardio: RRR, no murmurs appreciated Abdomen: Obese, soft, nontender Back: 2 well healing wounds in upper lumbar area which are extremely TTP. No obvious hematoma or surrounding erythema Neuro: Grossly intact  Assessment: 43 y.o. female f/u chronic pain  Plan: See Problem List and After Visit Summary

## 2013-07-10 ENCOUNTER — Telehealth: Payer: Self-pay | Admitting: Family Medicine

## 2013-07-10 NOTE — Telephone Encounter (Signed)
Will forward to MD. Jazmin Hartsell,CMA  

## 2013-07-10 NOTE — Telephone Encounter (Signed)
Pt called and needs a refill on her pain medication left up front for pickup. jw

## 2013-07-12 MED ORDER — OXYCODONE-ACETAMINOPHEN 7.5-325 MG PO TABS: 1.0000 | ORAL_TABLET | Freq: Four times a day (QID) | ORAL | Status: DC | PRN

## 2013-07-12 NOTE — Telephone Encounter (Signed)
LM for pt that rx is up front ready for pick up. Jazmin Hartsell,CMA

## 2013-07-12 NOTE — Telephone Encounter (Signed)
Pt called and wanted to get an update on the status of her request for pain medication. jw

## 2013-07-12 NOTE — Telephone Encounter (Signed)
Rx printed and she can pick it up at front desk.  Natalee Tomkiewicz M. Cyprian Gongaware, M.D.

## 2013-07-13 ENCOUNTER — Telehealth: Payer: Self-pay | Admitting: Family Medicine

## 2013-07-13 ENCOUNTER — Ambulatory Visit (INDEPENDENT_AMBULATORY_CARE_PROVIDER_SITE_OTHER): Payer: Medicare Other | Admitting: Family Medicine

## 2013-07-13 ENCOUNTER — Encounter: Payer: Self-pay | Admitting: Family Medicine

## 2013-07-13 ENCOUNTER — Ambulatory Visit (HOSPITAL_COMMUNITY)
Admission: RE | Admit: 2013-07-13 | Discharge: 2013-07-13 | Disposition: A | Discharge status: Home / Self Care | Payer: Medicare Other | Source: Ambulatory Visit | Attending: Family Medicine | Admitting: Family Medicine

## 2013-07-13 VITALS — BP 115/69 | HR 103 | Temp 98.5°F | Wt 315.0 lb

## 2013-07-13 DIAGNOSIS — R079 Chest pain, unspecified: Secondary | ICD-10-CM

## 2013-07-13 DIAGNOSIS — M62838 Other muscle spasm: Secondary | ICD-10-CM

## 2013-07-13 NOTE — Assessment & Plan Note (Signed)
EKG obtained and reviewed. Normal sinus rhythm. A: Most likely pain is due to muscle spasm. P: -Ibuprofen 800mg  TID prn pain. Con't home narcotic pain medication if needed. -Valium 2mg  BID pain (advised to NOT take Xanax while on Valium) -Heat to area -Rest area as to not aggravate the spasm -F/u if worsens or fails to improve, but reassured this does not seem to be cardiac in nature

## 2013-07-13 NOTE — Progress Notes (Signed)
Patient ID: Crystal Garza, female   DOB: January 30, 1971, 43 y.o.   MRN: [id number]    Subjective: HPI: Patient is a 43 y.o. female presenting to clinic today for same day appointment for chest pain.  Patient reports an aching sensation in left chest going to shoulder and posterior neck for one week that got much worse since early this morning. Denies any new activity or movement. Pain is worse with palpation of area or moving a certain way. Hurts to move neck, hurts to move shoulder. Tried pain medicine, Zanaflex and Bengay which did not help. Denies nausea, upset stomach, or headache. No SOB.   History Reviewed: Non smoker.  ROS: Please see HPI above. No fevers.  Objective: Office vital signs reviewed. BP 115/69  Pulse 103  Temp(Src) 98.5 F (36.9 C) (Oral)  Wt 315 lb (142.883 kg)  Physical Examination:  General: Awake, alert. NAD, appears uncomfortable with certain movements Cardio: Chest wall TTP left side mid-clavicular line to axilla. No TTP over sternum. Heart sounds wnl Pulm: Good effort, no wheezes. Msk: TTP of chest, left shoulder and left posterior neck. No bony tenderness. Limited ROM of left shoulder and rotation of neck due to discomfort  Assessment: 43 y.o. female with muscle spasm  Plan: See Problem List and After Visit Summary

## 2013-07-13 NOTE — Telephone Encounter (Signed)
Outside Line Call   Patient is calling with muscle spasms in her neck that prevent her from turning her head to left with 9.5/10 pain in neck extending to her shoulder blade and chest. She denies dizziness, syncope, limb weakness, or incontinence but does report she was sweaty earlier this morning. She is already on her way to clinic for a sameday appointment for exam and evaluation by the time that I call her. Her PMH is complex and significant for left rotator cuff syndrome, chronic pain, and morbid obesity. I told this sounds like muscle spasms but the best way to evaluate, especially because of her associated chest pain, is in person. I also told her if there are no sameday appointment slots she can go next door to Urgent Care. She voices understanding.   Crystal Sinclair, MD

## 2013-07-18 ENCOUNTER — Telehealth: Payer: Self-pay | Admitting: Family Medicine

## 2013-07-18 NOTE — Telephone Encounter (Signed)
Patient would like to know status of rheumatology referral. Please call

## 2013-07-21 NOTE — Telephone Encounter (Signed)
PT is calling x status. I do not see any referral x rheumatology.  [place]

## 2013-07-21 NOTE — Telephone Encounter (Signed)
LM for pt to call back. We need to know her reason for needing to be seen at rheumatology.  Also if she possibly had this done with another physician.  There is no record of this being ordered in our system. Jazmin Hartsell,CMA

## 2013-07-21 NOTE — Telephone Encounter (Signed)
I do not remember ever referring her to rheumatology. I vaguely remember her mentioning this, can you confirm that we placed this referral rather than one of her other providers?  If this was to be placed by our clinic, can you confirm why she needs to go there?  Amber M. Hairford, M.D.

## 2013-07-28 ENCOUNTER — Ambulatory Visit (INDEPENDENT_AMBULATORY_CARE_PROVIDER_SITE_OTHER): Payer: Medicare Other | Admitting: Family Medicine

## 2013-07-28 ENCOUNTER — Encounter: Payer: Self-pay | Admitting: Family Medicine

## 2013-07-28 VITALS — BP 139/83 | HR 79 | Temp 97.7°F | Wt 322.0 lb

## 2013-07-28 DIAGNOSIS — R609 Edema, unspecified: Secondary | ICD-10-CM | POA: Insufficient documentation

## 2013-07-28 DIAGNOSIS — I10 Essential (primary) hypertension: Secondary | ICD-10-CM

## 2013-07-28 DIAGNOSIS — M2550 Pain in unspecified joint: Secondary | ICD-10-CM | POA: Insufficient documentation

## 2013-07-28 LAB — LIPID PANEL
Cholesterol: 151 mg/dL (ref 0–200)
HDL: 48 mg/dL (ref >39)
LDL Cholesterol: 91 mg/dL (ref 0–99)
Total CHOL/HDL Ratio: 3.1 Ratio
Triglycerides: 60 mg/dL (ref <150)
VLDL: 12 mg/dL (ref 0–40)

## 2013-07-28 LAB — POCT SEDIMENTATION RATE: POCT SED RATE: 57 mm/hr — AB (ref 0–22)

## 2013-07-28 MED ORDER — FUROSEMIDE 20 MG PO TABS: 20.0000 mg | ORAL_TABLET | Freq: Every day | ORAL | Status: DC

## 2013-07-28 MED ORDER — TIZANIDINE HCL 4 MG PO TABS: 4.0000 mg | ORAL_TABLET | Freq: Four times a day (QID) | ORAL | Status: DC | PRN

## 2013-07-28 MED ORDER — NYSTATIN 100000 UNIT/GM EX CREA: 1.0000 "application " | TOPICAL_CREAM | Freq: Two times a day (BID) | CUTANEOUS | Status: DC

## 2013-07-28 MED ORDER — OMEPRAZOLE 40 MG PO CPDR: 40.0000 mg | DELAYED_RELEASE_CAPSULE | Freq: Every day | ORAL | Status: DC

## 2013-07-28 MED ORDER — PROMETHAZINE HCL 25 MG PO TABS: 25.0000 mg | ORAL_TABLET | Freq: Four times a day (QID) | ORAL | Status: DC | PRN

## 2013-07-28 MED ORDER — LISINOPRIL 10 MG PO TABS: 10.0000 mg | ORAL_TABLET | Freq: Every day | ORAL | Status: DC

## 2013-07-28 MED ORDER — POTASSIUM CHLORIDE CRYS ER 10 MEQ PO TBCR: 10.0000 meq | EXTENDED_RELEASE_TABLET | Freq: Two times a day (BID) | ORAL | Status: DC

## 2013-07-28 NOTE — Progress Notes (Signed)
Patient ID: Crystal Garza, female   DOB: 08/15/1970, 43 y.o.   MRN: [id number]    Subjective: HPI: Patient is a 43 y.o. female presenting to clinic today for follow up appointment for her shoulder pain. Concerns today include weight gain  1. Shoulder/joint pain- Patient with pain in multiple joints. She was recently evaluated for pain ine the left shoulder/chest/neck. Cardiac etiology ruled out. Pain persists. She is unable to sleep at night. Mother and sister diagnosed with fibromyalgia and she would like to see rheumatologist for further evaluation. She has never had screening blood work for autoimmune.  2. Weight gain- Patient had 7lb weight gain in 2 weeks. No changes in dietary habits. She states she feels like she is retaining fluids. We took her off the HCTZ because her blood pressure was controlled and her K+ was low. She denies CP, SOB, leg edema, orthopnea.   3. Health maintenance- Needs lipid panel since she is fasting today.  History Reviewed: Non smoker.  ROS: Please see HPI above.  Objective: Office vital signs reviewed. BP 139/83  Pulse 79  Temp(Src) 97.7 F (36.5 C) (Oral)  Wt 322 lb (146.058 kg)  Physical Examination:  General: Awake, alert. NAD HEENT: Atraumatic, normocephalic. MMM Neck: No masses palpated. No LAD. Decreased ROM due to pain Pulm: CTAB, no wheezes Cardio: RRR, no murmurs appreciated Abdomen:Obese, +BS, soft, nontender, nondistended Extremities: Trace edema. Left shoulder with full ROM. TTP of entire shoulder, in particular the [facility] joint. No obvious deformity, edema or erythema. Neuro: Strength and sensation gossly intact  Assessment: 43 y.o. female follow up  Plan: See Problem List and After Visit Summary

## 2013-07-28 NOTE — Assessment & Plan Note (Signed)
Lipid panel today

## 2013-07-28 NOTE — Assessment & Plan Note (Signed)
Pain in multiple joints. Requesting rheumatology eval  - ANA and sed rate today - Con't current pain control - ROM exercises for shoulder - Rheumatology referral placed

## 2013-07-28 NOTE — Patient Instructions (Signed)
We will check your labs today.  I will let you know if they are abnormal, otherwise i will send you a letter.  I will leave it up to you if you want to see the orthopedic doctor.   We will call you with the rheumatology appointment.  Sheran Newstrom M. Santosh Petter, M.D.

## 2013-07-28 NOTE — Assessment & Plan Note (Signed)
Documented weight gain in last 2 weeks. No signs of CHF.  - Lasix 20mg  x3 days - Kdur 39mEq - Daily weights - Monitor for symptoms. Go to hospital if she develops any symptoms or if she is concerned - F/u with me in 2 weeks or sooner if needed

## 2013-07-31 LAB — ANA: Anti Nuclear Antibody(ANA): NEGATIVE

## 2013-08-11 ENCOUNTER — Encounter: Payer: Self-pay | Admitting: Family Medicine

## 2013-08-11 ENCOUNTER — Ambulatory Visit (INDEPENDENT_AMBULATORY_CARE_PROVIDER_SITE_OTHER): Payer: Medicare Other | Admitting: Family Medicine

## 2013-08-11 VITALS — BP 135/77 | HR 81 | Ht 67.0 in | Wt 320.0 lb

## 2013-08-11 DIAGNOSIS — G8929 Other chronic pain: Secondary | ICD-10-CM

## 2013-08-11 DIAGNOSIS — J069 Acute upper respiratory infection, unspecified: Secondary | ICD-10-CM | POA: Insufficient documentation

## 2013-08-11 MED ORDER — OXYCODONE-ACETAMINOPHEN 7.5-325 MG PO TABS: 1.0000 | ORAL_TABLET | Freq: Four times a day (QID) | ORAL | Status: DC | PRN

## 2013-08-11 MED ORDER — LORATADINE 10 MG PO TABS: 10.0000 mg | ORAL_TABLET | Freq: Every day | ORAL | Status: DC

## 2013-08-11 MED ORDER — BENZONATATE 200 MG PO CAPS: 200.0000 mg | ORAL_CAPSULE | Freq: Three times a day (TID) | ORAL | Status: DC | PRN

## 2013-08-11 MED ORDER — FLUTICASONE PROPIONATE 50 MCG/ACT NA SUSP: 2.0000 | Freq: Every day | NASAL | Status: DC

## 2013-08-11 NOTE — Progress Notes (Signed)
   Subjective:    Patient ID: Crystal Garza, female    DOB: [birth year], 43 y.o.   MRN: [id number]  HPI Patient presents today with complaints of cough, sneezing, sore throat, chills and congestion.  States that she has had symptoms for a week.  States that she has been using Flonase and Claritin for symptoms, but has had minimum relief.  Reports that several family members have had same symptoms also and she believes that she got this from them.  She also needs refills on her pain medication.  Review of Systems     Objective:   Physical Exam Vital Signs:  BP: 135/77, P 81, R 18, O2 99%, HT 5'7", WT 320. Skin: Warm, dry and intact. Head: Normocephalic Eyes: PERRLA; conjunctiva pink and moist; cornea clear Ears: TM pearly-gray and intact, with no signs of infection, swelling, edema, erythema, discharge or pain noted. Nose:  Nares patent and without lesions, sore, redness or turbinate swelling.    Mouth/Throat: Oropharynx pink, moist tongue midline 1+ without exudates; no sores or lesions noted on inspection.   Neck/Lymph Nodes: No swelling in neck, lymph nodes soft and moveable, without pain or tenderness. Trachea midline and without deviation or tenderness.  No thyroid enlargement, lumps or nodules. Chest/Lungs: Chest symmetrical, and no signs of retractions, and accessory muscle use.  No tactile fremitus. Breath sounds clear and equal bilaterally, with no adventitious sounds noted. Cardiovascular: RRR, S1 and S2 normal, no murmurs, clicks rubs or gallops.  PPP, lesions, edema or masses noted.  No clubbing or deformities.  Peripheral pulses are symmetrical and 3+ in all extremeties. GI:  Abdominal obese and distended, no scars and discoloration.  Bowel sounds heard in all 4 quadrants.  No tenderness, masses, or pain noted upon palpation in all 4 quadrants.   Musculoskeletal:  Gait is stable. No edema or cyanosis noted.     Assessment & Plan:   See problem based A&P

## 2013-08-11 NOTE — Patient Instructions (Signed)
I am sorry you are not feeling well today.  Try the cough medicine. You can also take generic Guaifenesin if you need it.  If you are not better by Tuesday, please call me and we can talk about an antibiotic.  Monigue Spraggins M. Sofiya Ezelle, M.D.

## 2013-08-11 NOTE — Assessment & Plan Note (Addendum)
A: History and exam most consistent with Viral URI. Cough is most concerning for her. No s/sx of acute sinusitis.  P: - Symptomatic treatment - Con't pain medication - Tessalon 200mg  prn cough - OTC robitussin - Flonase and Claritin refilled - F/u if not improved in 1 week, or if she worsnens

## 2013-08-11 NOTE — Assessment & Plan Note (Signed)
Pain medication refilled. Percocet #120 given.

## 2013-08-18 ENCOUNTER — Ambulatory Visit (INDEPENDENT_AMBULATORY_CARE_PROVIDER_SITE_OTHER): Payer: Medicare Other | Admitting: Family Medicine

## 2013-08-18 ENCOUNTER — Encounter: Payer: Self-pay | Admitting: Family Medicine

## 2013-08-18 VITALS — BP 101/69 | HR 108 | Temp 98.5°F | Ht 67.0 in | Wt 318.0 lb

## 2013-08-18 DIAGNOSIS — H938X9 Other specified disorders of ear, unspecified ear: Secondary | ICD-10-CM

## 2013-08-18 DIAGNOSIS — Z9889 Other specified postprocedural states: Secondary | ICD-10-CM

## 2013-08-18 DIAGNOSIS — L309 Dermatitis, unspecified: Secondary | ICD-10-CM

## 2013-08-18 DIAGNOSIS — L259 Unspecified contact dermatitis, unspecified cause: Secondary | ICD-10-CM

## 2013-08-18 DIAGNOSIS — Z903 Acquired absence of stomach [part of]: Secondary | ICD-10-CM

## 2013-08-18 MED ORDER — TRIAMCINOLONE ACETONIDE 0.1 % EX OINT: 1.0000 "application " | TOPICAL_OINTMENT | Freq: Two times a day (BID) | CUTANEOUS | Status: DC | PRN

## 2013-08-18 NOTE — Progress Notes (Signed)
Patient ID: Crystal Garza, female   DOB: 1970-07-24, 43 y.o.   MRN: [id number]    Subjective: HPI: Patient is a 43 y.o. female presenting to clinic today for same day appointment. Concerns today include referral to surgery, ears popping and skin rash  1. Surgery referral- Needs new referral for CSS to follow up on sleeve.  2. Popping ear- She states for the last week she has been having popping in left ear. She had some cough which has resolved. She has had jaw issues in the past, unsure if this is the same. Mildly painful. No fevers. No runny nose but has post-nasal drip. Still using Flonase  3. Rash- A few day history of rashes on arms. Pruritic rash. She has used steroid cream which helps some. Only new medication is Tessalon. Rash only on arms. No new detergents, lotions or perfumes.No travel.  History Reviewed: Non smoker. Health Maintenance: UTD  ROS: Please see HPI above.  Objective: Office vital signs reviewed. BP 101/69  Pulse 108  Temp(Src) 98.5 F (36.9 C) (Oral)  Ht 5\' 7"  (1.702 m)  Wt 318 lb (144.244 kg)  BMI 49.79 kg/m2  Physical Examination:  General: Awake, alert. NAD HEENT: Atraumatic, normocephalic. MMM. Posterior pharynx erythematous. TM wnl with very mild bulging without injection. Neck: No masses palpated. No LAD Pulm: CTAB, no wheezes Cardio: RRR, no murmurs appreciated Abdomen: Obese, +BS, soft, nontender, nondistended Extremities: No edema Skin: Fine, erythematous papules on chest and arms with excoriations. Neuro: Grossly intact  Assessment: 43 y.o. female follow up  Plan: See Problem List and After Visit Summary

## 2013-08-18 NOTE — Patient Instructions (Signed)
Use the ointment as needed for redness or itching on your arms and chest.  Use Flonase daily. Pick up the generic Loratadine at [facility]. (88 cents in the pharmacy section.)  If you need anything, let me know!  Kimi Kroft M. Taeja Debellis, M.D.

## 2013-08-18 NOTE — Assessment & Plan Note (Signed)
New referral placed for surgery to see Dr. Hassell Done

## 2013-08-18 NOTE — Assessment & Plan Note (Signed)
A: No obvious reason other than sinus congestion. TMJ wnl, TM wnl. No periauricular tenderness  P: - con't flonase - Start Claritin daily - F/u if worsens or fails to improve.

## 2013-08-18 NOTE — Assessment & Plan Note (Signed)
A: Rash most consistent with eczema without any known triggers.  P: - TAC prn - F/u if worsens

## 2013-09-06 ENCOUNTER — Telehealth: Payer: Self-pay | Admitting: Family Medicine

## 2013-09-06 NOTE — Telephone Encounter (Signed)
Patient cannot have refill until May 10. She received #120 on April 10 which should be more than enough. According to our contract early refills will not be given. I will print this off for her on Friday to have it filled on Sunday, however, she will not get next refill until June 10th.  Thanks, [employer]. Anael Rosch, M.D.

## 2013-09-06 NOTE — Telephone Encounter (Signed)
LMOVM for pt to call back. Crystal Garza D Crystal Garza  

## 2013-09-06 NOTE — Telephone Encounter (Signed)
Pt called and needs refill on her pain medication. jw

## 2013-09-08 ENCOUNTER — Other Ambulatory Visit: Payer: Self-pay | Admitting: Family Medicine

## 2013-09-08 MED ORDER — OXYCODONE-ACETAMINOPHEN 7.5-325 MG PO TABS: 1.0000 | ORAL_TABLET | Freq: Four times a day (QID) | ORAL | Status: DC | PRN

## 2013-09-08 NOTE — Progress Notes (Signed)
Rx printed.  Next Rx will not be completed until June 10.  Please remind patient to make it last and no early refills will be given. If early requests become an issue we will need to readdress her pain.  Fotini Lemus M. Koston Hennes, M.D.

## 2013-09-08 NOTE — Progress Notes (Signed)
LMOVM for pt to call back. Jessica D Fleeger  

## 2013-09-13 ENCOUNTER — Ambulatory Visit (INDEPENDENT_AMBULATORY_CARE_PROVIDER_SITE_OTHER): Payer: Medicare Other | Admitting: Surgery

## 2013-09-13 ENCOUNTER — Encounter (INDEPENDENT_AMBULATORY_CARE_PROVIDER_SITE_OTHER): Payer: Self-pay | Admitting: Surgery

## 2013-09-13 VITALS — BP 134/82 | HR 81 | Temp 97.6°F | Resp 16 | Ht 67.0 in | Wt 318.0 lb

## 2013-09-13 DIAGNOSIS — Z903 Acquired absence of stomach [part of]: Secondary | ICD-10-CM

## 2013-09-13 DIAGNOSIS — Z9884 Bariatric surgery status: Secondary | ICD-10-CM

## 2013-09-13 NOTE — Patient Instructions (Signed)
See Crystal Garza for dietary guidance

## 2013-09-13 NOTE — Progress Notes (Signed)
Crystal Garza 43 y.o.  Body mass index is 49.79 kg/(m^2).  Patient Active Problem List   Diagnosis Date Noted  . Ear popping 08/18/2013  . Edema 07/28/2013  . Chronic pain 06/13/2013  . 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 02/17/2012  . Bipolar disorder 01/19/2012  . Nephrolithiasis 02/20/2011  . Eczematous dermatitis 10/22/2010  . Iron deficiency anemia 06/27/2010  . Anxiety 06/27/2010  . MIGRAINE, CHRONIC 09/12/2009  . Morbid obesity-BMI 62 10/30/2008  . OSTEOARTHRITIS, KNEES, BILATERAL 07/05/2007  . DEPRESSION, MAJOR, RECURRENT 07/01/2006  . HYPERTENSION, BENIGN SYSTEMIC 07/01/2006  . RHINITIS, ALLERGIC 07/01/2006  . GASTROESOPHAGEAL REFLUX, NO ESOPHAGITIS 07/01/2006    Allergies  Allergen Reactions  . Codeine Rash    Past Surgical History  Procedure Laterality Date  . Abdominal hysterectomy    . Knee arthroscopy    . Cesarean section  05/1991  . Breath tek h pylori  07/28/2011    Procedure: BREATH TEK H PYLORI;  Surgeon: [name], MD;  Location: [facility];  Service: General;  Laterality: N/A;  to be done at 745  . Esophageal biopsy  02/16/2012    Procedure: BIOPSY;  Surgeon: [name], MD;  Location: [facility];  Service: General;;  biopsy of mass x 2  . Eus  03/03/2012    Procedure: UPPER ENDOSCOPIC ULTRASOUND (EUS) LINEAR;  Surgeon: Milus Banister, MD;  Location: [facility];  Service: Endoscopy;  Laterality: N/A;  . Laparoscopic gastric banding    . Tubal ligation    . Laparoscopic gastrectomy  04/08/2012    Procedure: LAPAROSCOPIC GASTRECTOMY;  Surgeon: [name], MD;  Location: [facility];  Service: General;;  removal of GIST tumor of stomach  . Laparoscopic gastric sleeve resection N/A 08/29/2012    Procedure: LAPAROSCOPIC GASTRIC SLEEVE RESECTION;  Surgeon: [name], MD;  Location: [facility];  Service: General;  Laterality: N/A;   Sleeve Gastrectomy  . Esophagogastroduodenoscopy N/A 08/29/2012    Procedure: ESOPHAGOGASTRODUODENOSCOPY (EGD);  Surgeon: [name], MD;  Location: [facility];  Service: General;  Laterality: N/A;   Melrose Nakayama, MD No diagnosis found.  S. Has plateaued and her weight loss efforts. She has lost 56 pounds since her sleeve gastrectomy and is up to pounds since I saw her in December. I think she recognizes that she has been eating some crackers and other things that she should not be anatomic this is what is undermining or weight loss. I will get her to see Rich Fuchs correct. I will see her again in 4 months. In the meantime she will be having a total knee replacement by Zollie Beckers in early June Matt B. Hassell Done, MD, [facility] [phone] beeper [phone]  09/13/2013 4:14 PM

## 2013-09-18 ENCOUNTER — Other Ambulatory Visit (HOSPITAL_COMMUNITY): Payer: Self-pay | Admitting: Orthopaedic Surgery

## 2013-09-21 ENCOUNTER — Encounter (HOSPITAL_COMMUNITY): Payer: Self-pay | Admitting: Pharmacy Technician

## 2013-09-27 ENCOUNTER — Other Ambulatory Visit (HOSPITAL_COMMUNITY): Payer: Medicare Other

## 2013-09-27 NOTE — Patient Instructions (Addendum)
20     Your procedure is scheduled on:  Friday 10/06/2013  Report to [facility] Main Entrance and follow signs to Short Stay  At 1215 PM.  Call this number if you have problems the night before or morning of surgery:  [phone]   Remember:             IF YOU USE CPAP,BRING MASK AND TUBING AM OF SURGERY!             IF YOU DO NOT HAVE YOUR TYPE AND SCREEN DRAWN AT PRE-ADMIT APPOINTMENT, YOU WILL HAVE IT DRAWN AM OF SURGERY!   Do not eat food  AFTER MIDNIGHT! MAY HAVE CLEAR LIQUIDS FROM MIDNIGHT UP UNTIL 0815 AM THEN NOTHING UNTIL AFTER SURGERY!  Take these medicines the morning of surgery with A SIP OF WATER: NONE    [facility] IS NOT RESPONSIBLE FOR ANY BELONGINGS OR VALUABLES BROUGHT TO HOSPITAL.  Marland Kitchen  Leave suitcase in the car. After surgery it may be brought to your room.  For patients admitted to the hospital, checkout time is 11:00 AM the day of              Discharge.    DO NOT WEAR  JEWELRY,MAKE-UP,LOTIONS,POWDERS,PERFUMES,CONTACTS , DENTURES OR BRIDGEWORK ,AND DO NOT WEAR FALSE EYELASHES                                    Patients discharged the day of surgery will not be allowed to drive home.  If going home the same day of surgery, must have someone stay with you  first 24 hrs.at home and arrange for someone to drive you home from the [place]: n/a   Special Instructions:              Please read over the following fact sheets that you were given:             1. [place].Tobin Chad     [phone]                              [facility] - Preparing for Surgery Before surgery, you can play an important role.  Because skin is not sterile, your skin needs to be as free of germs as possible.  You can reduce the number of germs on your skin by washing with CHG (chlorahexidine gluconate) soap before surgery.   CHG is an antiseptic cleaner which kills germs and bonds with the skin to continue killing germs even after washing. Please DO NOT use if you have an allergy to CHG or antibacterial soaps.  If your skin becomes reddened/irritated stop using the CHG and inform your nurse when you arrive at Short Stay. Do not shave (including legs and underarms) for at least 48 hours prior to the first CHG shower.  You may shave your face/neck.  Please follow these instructions carefully:  1.  Shower with CHG Soap the night before surgery and the  morning of Surgery.  2.  If you choose to wash your hair, wash your hair first as usual with your  normal  shampoo.  3.  After you shampoo, rinse your hair and body thoroughly to remove the  shampoo.                           4.  Use CHG as you would any other liquid soap.  You can apply chg directly  to the skin and wash                       Gently with a scrungie or clean washcloth.  5.  Apply the CHG Soap to your body ONLY FROM THE NECK DOWN.   Do not use on face/ open                           Wound or open sores. Avoid contact with eyes, ears mouth and genitals (private parts).                       Wash face,  Genitals (private parts) with your normal soap.             6.  Wash thoroughly, paying special attention to the area where your surgery  will be performed.  7.  Thoroughly rinse your body with warm water from the neck down.  8.  DO NOT shower/wash with your normal soap after using and rinsing off  the CHG Soap.                9.  Pat yourself dry with a clean towel.            10.  Wear clean pajamas.            11.  Place clean sheets on your bed the night of your first shower and do not  sleep with pets. Day of Surgery : Do not apply any lotions/deodorants the morning of surgery.  Please wear clean clothes to the hospital/surgery center.  FAILURE TO FOLLOW THESE INSTRUCTIONS MAY RESULT IN THE CANCELLATION OF YOUR SURGERY PATIENT  SIGNATURE_________________________________  NURSE SIGNATURE__________________________________  ________________________________________________________________________   Crystal Garza  An incentive spirometer is a tool that can help keep your lungs clear and active. This tool measures how well you are filling your lungs with each breath. Taking long deep breaths may help reverse or decrease the chance of developing breathing (pulmonary) problems (especially infection) following:  A long period of time when you are unable to move or be active. BEFORE THE PROCEDURE   If the spirometer includes an indicator to show your best effort, your nurse or respiratory therapist will set it to a desired goal.  If possible, sit up straight or lean slightly forward. Try not to slouch.  Hold the incentive spirometer in an upright position. INSTRUCTIONS FOR USE  1. Sit on the edge of your bed if possible, or sit up as far as you can in bed or on a chair. 2. Hold the incentive spirometer in an upright position. 3. Breathe out normally. 4. Place the mouthpiece in your mouth and seal your lips tightly around it. 5. Breathe in slowly and as deeply as possible, raising the piston or the ball  toward the top of the column. 6. Hold your breath for 3-5 seconds or for as long as possible. Allow the piston or ball to fall to the bottom of the column. 7. Remove the mouthpiece from your mouth and breathe out normally. 8. Rest for a few seconds and repeat Steps 1 through 7 at least 10 times every 1-2 hours when you are awake. Take your time and take a few normal breaths between deep breaths. 9. The spirometer may include an indicator to show your best effort. Use the indicator as a goal to work toward during each repetition. 10. After each set of 10 deep breaths, practice coughing to be sure your lungs are clear. If you have an incision (the cut made at the time of surgery), support your incision when coughing  by placing a pillow or rolled up towels firmly against it. Once you are able to get out of bed, walk around indoors and cough well. You may stop using the incentive spirometer when instructed by your caregiver.  RISKS AND COMPLICATIONS  Take your time so you do not get dizzy or light-headed.  If you are in pain, you may need to take or ask for pain medication before doing incentive spirometry. It is harder to take a deep breath if you are having pain. AFTER USE  Rest and breathe slowly and easily.  It can be helpful to keep track of a log of your progress. Your caregiver can provide you with a simple table to help with this. If you are using the spirometer at home, follow these instructions: SEEK MEDICAL CARE IF:   You are having difficultly using the spirometer.  You have trouble using the spirometer as often as instructed.  Your pain medication is not giving enough relief while using the spirometer.  You develop fever of 100.5 F (38.1 C) or higher. SEEK IMMEDIATE MEDICAL CARE IF:   You cough up bloody sputum that had not been present before.  You develop fever of 102 F (38.9 C) or greater.  You develop worsening pain at or near the incision site. MAKE SURE YOU:   Understand these instructions.  Will watch your condition.  Will get help right away if you are not doing well or get worse. Document Released: 08/31/2006 Document Revised: 07/13/2011 Document Reviewed: 11/01/2006 Exit[employer] Patient Information 2014 ExitCare, [place].   ________________________________________________________________________  WHAT IS A BLOOD TRANSFUSION? Blood Transfusion Information  A transfusion is the replacement of blood or some of its parts. Blood is made up of multiple cells which provide different functions.  Red blood cells carry oxygen and are used for blood loss replacement.  White blood cells fight against infection.  Platelets control bleeding.  Plasma helps clot  blood.  Other blood products are available for specialized needs, such as hemophilia or other clotting disorders. BEFORE THE TRANSFUSION  Who gives blood for transfusions?   Healthy volunteers who are fully evaluated to make sure their blood is safe. This is blood bank blood. Transfusion therapy is the safest it has ever been in the practice of medicine. Before blood is taken from a donor, a complete history is taken to make sure that person has no history of diseases nor engages in risky social behavior (examples are intravenous drug use or sexual activity with multiple partners). The donor's travel history is screened to minimize risk of transmitting infections, such as malaria. The donated blood is tested for signs of infectious diseases, such as HIV and hepatitis. The blood is  then tested to be sure it is compatible with you in order to minimize the chance of a transfusion reaction. If you or a relative donates blood, this is often done in anticipation of surgery and is not appropriate for emergency situations. It takes many days to process the donated blood. RISKS AND COMPLICATIONS Although transfusion therapy is very safe and saves many lives, the main dangers of transfusion include:   Getting an infectious disease.  Developing a transfusion reaction. This is an allergic reaction to something in the blood you were given. Every precaution is taken to prevent this. The decision to have a blood transfusion has been considered carefully by your caregiver before blood is given. Blood is not given unless the benefits outweigh the risks. AFTER THE TRANSFUSION  Right after receiving a blood transfusion, you will usually feel much better and more energetic. This is especially true if your red blood cells have gotten low (anemic). The transfusion raises the level of the red blood cells which carry oxygen, and this usually causes an energy increase.  The nurse administering the transfusion will monitor  you carefully for complications. HOME CARE INSTRUCTIONS  No special instructions are needed after a transfusion. You may find your energy is better. Speak with your caregiver about any limitations on activity for underlying diseases you may have. SEEK MEDICAL CARE IF:   Your condition is not improving after your transfusion.  You develop redness or irritation at the intravenous (IV) site. SEEK IMMEDIATE MEDICAL CARE IF:  Any of the following symptoms occur over the next 12 hours:  Shaking chills.  You have a temperature by mouth above 102 F (38.9 C), not controlled by medicine.  Chest, back, or muscle pain.  People around you feel you are not acting correctly or are confused.  Shortness of breath or difficulty breathing.  Dizziness and fainting.  You get a rash or develop hives.  You have a decrease in urine output.  Your urine turns a dark color or changes to pink, red, or brown. Any of the following symptoms occur over the next 10 days:  You have a temperature by mouth above 102 F (38.9 C), not controlled by medicine.  Shortness of breath.  Weakness after normal activity.  The white part of the eye turns yellow (jaundice).  You have a decrease in the amount of urine or are urinating less often.  Your urine turns a dark color or changes to pink, red, or brown. Document Released: 04/17/2000 Document Revised: 07/13/2011 Document Reviewed: 12/05/2007 [facility] Patient Information 2014 [place], [place].  _______________________________________________________________________

## 2013-09-28 ENCOUNTER — Encounter (HOSPITAL_COMMUNITY): Payer: Self-pay

## 2013-09-28 ENCOUNTER — Encounter (HOSPITAL_COMMUNITY)
Admission: RE | Admit: 2013-09-28 | Discharge: 2013-09-28 | Disposition: A | Discharge status: Home / Self Care | Payer: Medicare Other | Source: Ambulatory Visit | Attending: Orthopaedic Surgery | Admitting: Orthopaedic Surgery

## 2013-09-28 ENCOUNTER — Ambulatory Visit (HOSPITAL_COMMUNITY)
Admission: RE | Admit: 2013-09-28 | Discharge: 2013-09-28 | Disposition: A | Discharge status: Home / Self Care | Payer: Medicare Other | Source: Ambulatory Visit | Attending: Anesthesiology | Admitting: Anesthesiology

## 2013-09-28 DIAGNOSIS — Z01812 Encounter for preprocedural laboratory examination: Secondary | ICD-10-CM | POA: Insufficient documentation

## 2013-09-28 DIAGNOSIS — Z01818 Encounter for other preprocedural examination: Secondary | ICD-10-CM | POA: Insufficient documentation

## 2013-09-28 LAB — BASIC METABOLIC PANEL
BUN: 7 mg/dL (ref 6–23)
CO2: 25 mEq/L (ref 19–32)
Calcium: 9.3 mg/dL (ref 8.4–10.5)
Chloride: 101 mEq/L (ref 96–112)
Creatinine, Ser: 0.69 mg/dL (ref 0.50–1.10)
GFR calc Af Amer: >90 mL/min (ref >90)
GFR calc non Af Amer: >90 mL/min (ref >90)
Glucose, Bld: 86 mg/dL (ref 70–99)
Potassium: 3.9 mEq/L (ref 3.7–5.3)
Sodium: 139 mEq/L (ref 137–147)

## 2013-09-28 LAB — URINE MICROSCOPIC-ADD ON

## 2013-09-28 LAB — URINALYSIS, ROUTINE W REFLEX MICROSCOPIC
Bilirubin Urine: NEGATIVE
Glucose, UA: NEGATIVE mg/dL
Ketones, ur: NEGATIVE mg/dL
Leukocytes, UA: NEGATIVE
Nitrite: NEGATIVE
Protein, ur: NEGATIVE mg/dL
Specific Gravity, Urine: 1.027 (ref 1.005–1.030)
Urobilinogen, UA: 1 mg/dL (ref 0.0–1.0)
pH: 5.5 (ref 5.0–8.0)

## 2013-09-28 LAB — CBC
HCT: 39.4 % (ref 36.0–46.0)
Hemoglobin: 12.7 g/dL (ref 12.0–15.0)
MCH: 27.3 pg (ref 26.0–34.0)
MCHC: 32.2 g/dL (ref 30.0–36.0)
MCV: 84.7 fL (ref 78.0–100.0)
Platelets: 473 10*3/uL — ABNORMAL HIGH (ref 150–400)
RBC: 4.65 MIL/uL (ref 3.87–5.11)
RDW: 14.5 % (ref 11.5–15.5)
WBC: 11.5 10*3/uL — ABNORMAL HIGH (ref 4.0–10.5)

## 2013-09-28 LAB — APTT: aPTT: 29 seconds (ref 24–37)

## 2013-09-28 LAB — PROTIME-INR
INR: 0.98 (ref 0.00–1.49)
Prothrombin Time: 12.8 seconds (ref 11.6–15.2)

## 2013-09-28 LAB — SURGICAL PCR SCREEN
MRSA, PCR: NEGATIVE
Staphylococcus aureus: NEGATIVE

## 2013-10-05 MED ORDER — DEXTROSE 5 % IV SOLN
3.0000 g | INTRAVENOUS | Status: AC
  Administered 2013-10-06: 3 g via INTRAVENOUS
  Filled 2013-10-05: qty 3000

## 2013-10-06 ENCOUNTER — Inpatient Hospital Stay (HOSPITAL_COMMUNITY): Payer: Medicare Other | Admitting: Anesthesiology

## 2013-10-06 ENCOUNTER — Inpatient Hospital Stay (HOSPITAL_COMMUNITY)
Admission: RE | Admit: 2013-10-06 | Discharge: 2013-10-09 | DRG: 469 | Disposition: A | Discharge status: Home Health | Payer: Medicare Other | Source: Ambulatory Visit | Attending: Orthopaedic Surgery | Admitting: Orthopaedic Surgery

## 2013-10-06 ENCOUNTER — Inpatient Hospital Stay (HOSPITAL_COMMUNITY): Payer: Medicare Other

## 2013-10-06 ENCOUNTER — Encounter (HOSPITAL_COMMUNITY): Payer: Medicare Other | Admitting: Anesthesiology

## 2013-10-06 ENCOUNTER — Encounter (HOSPITAL_COMMUNITY): Payer: Self-pay | Admitting: *Deleted

## 2013-10-06 ENCOUNTER — Encounter (HOSPITAL_COMMUNITY)
Admission: RE | Disposition: A | Discharge status: Home Health | Payer: Medicare Other | Source: Ambulatory Visit | Attending: Orthopaedic Surgery

## 2013-10-06 DIAGNOSIS — Z9884 Bariatric surgery status: Secondary | ICD-10-CM

## 2013-10-06 DIAGNOSIS — Z96659 Presence of unspecified artificial knee joint: Secondary | ICD-10-CM

## 2013-10-06 DIAGNOSIS — K219 Gastro-esophageal reflux disease without esophagitis: Secondary | ICD-10-CM | POA: Diagnosis present

## 2013-10-06 DIAGNOSIS — F413 Other mixed anxiety disorders: Secondary | ICD-10-CM | POA: Diagnosis present

## 2013-10-06 DIAGNOSIS — Z87442 Personal history of urinary calculi: Secondary | ICD-10-CM | POA: Diagnosis not present

## 2013-10-06 DIAGNOSIS — M1710 Unilateral primary osteoarthritis, unspecified knee: Principal | ICD-10-CM | POA: Diagnosis present

## 2013-10-06 DIAGNOSIS — D509 Iron deficiency anemia, unspecified: Secondary | ICD-10-CM | POA: Diagnosis present

## 2013-10-06 DIAGNOSIS — I10 Essential (primary) hypertension: Secondary | ICD-10-CM | POA: Diagnosis present

## 2013-10-06 DIAGNOSIS — F339 Major depressive disorder, recurrent, unspecified: Secondary | ICD-10-CM | POA: Diagnosis present

## 2013-10-06 DIAGNOSIS — Z8249 Family history of ischemic heart disease and other diseases of the circulatory system: Secondary | ICD-10-CM | POA: Diagnosis not present

## 2013-10-06 DIAGNOSIS — I2699 Other pulmonary embolism without acute cor pulmonale: Secondary | ICD-10-CM | POA: Diagnosis present

## 2013-10-06 DIAGNOSIS — F319 Bipolar disorder, unspecified: Secondary | ICD-10-CM | POA: Diagnosis present

## 2013-10-06 DIAGNOSIS — M25569 Pain in unspecified knee: Secondary | ICD-10-CM | POA: Diagnosis present

## 2013-10-06 DIAGNOSIS — Z6841 Body Mass Index (BMI) 40.0 and over, adult: Secondary | ICD-10-CM | POA: Diagnosis not present

## 2013-10-06 DIAGNOSIS — G8929 Other chronic pain: Secondary | ICD-10-CM | POA: Diagnosis present

## 2013-10-06 DIAGNOSIS — F419 Anxiety disorder, unspecified: Secondary | ICD-10-CM

## 2013-10-06 DIAGNOSIS — Z885 Allergy status to narcotic agent status: Secondary | ICD-10-CM

## 2013-10-06 DIAGNOSIS — M1712 Unilateral primary osteoarthritis, left knee: Secondary | ICD-10-CM

## 2013-10-06 DIAGNOSIS — I82403 Acute embolism and thrombosis of unspecified deep veins of lower extremity, bilateral: Secondary | ICD-10-CM

## 2013-10-06 DIAGNOSIS — F411 Generalized anxiety disorder: Secondary | ICD-10-CM | POA: Diagnosis present

## 2013-10-06 DIAGNOSIS — Z833 Family history of diabetes mellitus: Secondary | ICD-10-CM | POA: Diagnosis not present

## 2013-10-06 HISTORY — PX: TOTAL KNEE ARTHROPLASTY: SHX125

## 2013-10-06 LAB — TYPE AND SCREEN
ABO/RH(D): B POS
Antibody Screen: NEGATIVE

## 2013-10-06 SURGERY — ARTHROPLASTY, KNEE, TOTAL
Anesthesia: Spinal | Site: Knee | Laterality: Left

## 2013-10-06 MED ORDER — ONDANSETRON HCL 4 MG PO TABS: 4.0000 mg | ORAL_TABLET | Freq: Four times a day (QID) | ORAL | Status: DC | PRN

## 2013-10-06 MED ORDER — PROPOFOL INFUSION 10 MG/ML OPTIME
INTRAVENOUS | Status: DC | PRN
  Administered 2013-10-06: 100 ug/kg/min via INTRAVENOUS

## 2013-10-06 MED ORDER — LIDOCAINE HCL (CARDIAC) 20 MG/ML IV SOLN
INTRAVENOUS | Status: AC
  Filled 2013-10-06: qty 5

## 2013-10-06 MED ORDER — DIPHENHYDRAMINE HCL 12.5 MG/5ML PO ELIX: 12.5000 mg | ORAL_SOLUTION | ORAL | Status: DC | PRN

## 2013-10-06 MED ORDER — SODIUM CHLORIDE 0.9 % IJ SOLN
INTRAMUSCULAR | Status: AC
Start: 2013-10-06 — End: 2013-10-06
  Filled 2013-10-06: qty 10

## 2013-10-06 MED ORDER — ALUM & MAG HYDROXIDE-SIMETH 200-200-20 MG/5ML PO SUSP
30.0000 mL | ORAL | Status: DC | PRN
Start: 2013-10-06 — End: 2013-10-09

## 2013-10-06 MED ORDER — ACETAMINOPHEN 650 MG RE SUPP: 650.0000 mg | Freq: Four times a day (QID) | RECTAL | Status: DC | PRN

## 2013-10-06 MED ORDER — METOCLOPRAMIDE HCL 5 MG/ML IJ SOLN: 5.0000 mg | Freq: Three times a day (TID) | INTRAMUSCULAR | Status: DC | PRN

## 2013-10-06 MED ORDER — ONDANSETRON HCL 4 MG/2ML IJ SOLN
INTRAMUSCULAR | Status: AC
  Filled 2013-10-06: qty 2

## 2013-10-06 MED ORDER — METHOCARBAMOL 1000 MG/10ML IJ SOLN
500.0000 mg | Freq: Four times a day (QID) | INTRAVENOUS | Status: DC | PRN
  Administered 2013-10-06: 500 mg via INTRAVENOUS
  Filled 2013-10-06: qty 5

## 2013-10-06 MED ORDER — PHENYLEPHRINE HCL 10 MG/ML IJ SOLN
INTRAMUSCULAR | Status: DC | PRN
  Administered 2013-10-06 (×2): 80 ug via INTRAVENOUS

## 2013-10-06 MED ORDER — PHENOL 1.4 % MT LIQD
1.0000 | OROMUCOSAL | Status: DC | PRN
  Filled 2013-10-06: qty 177

## 2013-10-06 MED ORDER — PROMETHAZINE HCL 25 MG/ML IJ SOLN
6.2500 mg | INTRAMUSCULAR | Status: DC | PRN
  Administered 2013-10-06: 6.25 mg via INTRAVENOUS

## 2013-10-06 MED ORDER — PROMETHAZINE HCL 25 MG/ML IJ SOLN
INTRAMUSCULAR | Status: AC
  Filled 2013-10-06: qty 1

## 2013-10-06 MED ORDER — LACTATED RINGERS IV SOLN
INTRAVENOUS | Status: DC | PRN
  Administered 2013-10-06 (×2): via INTRAVENOUS

## 2013-10-06 MED ORDER — POLYETHYLENE GLYCOL 3350 17 G PO PACK: 17.0000 g | PACK | Freq: Every day | ORAL | Status: DC | PRN

## 2013-10-06 MED ORDER — PROPOFOL 10 MG/ML IV BOLUS
INTRAVENOUS | Status: AC
  Filled 2013-10-06: qty 20

## 2013-10-06 MED ORDER — PROPOFOL 10 MG/ML IV BOLUS
INTRAVENOUS | Status: DC | PRN
  Administered 2013-10-06 (×2): 20 mg via INTRAVENOUS

## 2013-10-06 MED ORDER — PROPOFOL 10 MG/ML IV BOLUS
INTRAVENOUS | Status: AC
Start: 2013-10-06 — End: 2013-10-06
  Filled 2013-10-06: qty 20

## 2013-10-06 MED ORDER — BUPIVACAINE IN DEXTROSE 0.75-8.25 % IT SOLN
INTRATHECAL | Status: DC | PRN
  Administered 2013-10-06: 2 mL via INTRATHECAL

## 2013-10-06 MED ORDER — FENTANYL CITRATE 0.05 MG/ML IJ SOLN
INTRAMUSCULAR | Status: AC
  Filled 2013-10-06: qty 2

## 2013-10-06 MED ORDER — METOCLOPRAMIDE HCL 5 MG PO TABS
5.0000 mg | ORAL_TABLET | Freq: Three times a day (TID) | ORAL | Status: DC | PRN
  Filled 2013-10-06: qty 2

## 2013-10-06 MED ORDER — LAMOTRIGINE 100 MG PO TABS
100.0000 mg | ORAL_TABLET | Freq: Every day | ORAL | Status: DC
  Administered 2013-10-06 – 2013-10-08 (×3): 100 mg via ORAL
  Filled 2013-10-06 (×4): qty 1

## 2013-10-06 MED ORDER — OXYCODONE HCL ER 10 MG PO T12A
20.0000 mg | EXTENDED_RELEASE_TABLET | Freq: Two times a day (BID) | ORAL | Status: DC
  Administered 2013-10-06 – 2013-10-08 (×5): 20 mg via ORAL
  Filled 2013-10-06 (×5): qty 2

## 2013-10-06 MED ORDER — ONDANSETRON HCL 4 MG/2ML IJ SOLN
INTRAMUSCULAR | Status: DC | PRN
  Administered 2013-10-06: 4 mg via INTRAVENOUS

## 2013-10-06 MED ORDER — RISPERIDONE 1 MG PO TABS
1.0000 mg | ORAL_TABLET | Freq: Every day | ORAL | Status: DC
  Administered 2013-10-06 – 2013-10-08 (×3): 1 mg via ORAL
  Filled 2013-10-06 (×4): qty 1

## 2013-10-06 MED ORDER — SODIUM CHLORIDE 0.9 % IR SOLN
Status: DC | PRN
  Administered 2013-10-06: 1000 mL

## 2013-10-06 MED ORDER — LISINOPRIL 10 MG PO TABS
10.0000 mg | ORAL_TABLET | Freq: Every morning | ORAL | Status: DC
  Administered 2013-10-07 – 2013-10-09 (×2): 10 mg via ORAL
  Filled 2013-10-06 (×3): qty 1

## 2013-10-06 MED ORDER — MIDAZOLAM HCL 5 MG/5ML IJ SOLN
INTRAMUSCULAR | Status: DC | PRN
Start: 2013-10-06 — End: 2013-10-06
  Administered 2013-10-06 (×2): 1 mg via INTRAVENOUS

## 2013-10-06 MED ORDER — 0.9 % SODIUM CHLORIDE (POUR BTL) OPTIME
TOPICAL | Status: DC | PRN
  Administered 2013-10-06: 1000 mL

## 2013-10-06 MED ORDER — HYDROMORPHONE HCL PF 1 MG/ML IJ SOLN
1.0000 mg | INTRAMUSCULAR | Status: DC | PRN
  Administered 2013-10-06: 1 mg via INTRAVENOUS
  Administered 2013-10-07 (×2): 2 mg via INTRAVENOUS
  Administered 2013-10-07: 1 mg via INTRAVENOUS
  Filled 2013-10-06: qty 2
  Filled 2013-10-06: qty 1
  Filled 2013-10-06: qty 2

## 2013-10-06 MED ORDER — EPHEDRINE SULFATE 50 MG/ML IJ SOLN
INTRAMUSCULAR | Status: AC
  Filled 2013-10-06: qty 1

## 2013-10-06 MED ORDER — ASPIRIN EC 325 MG PO TBEC
325.0000 mg | DELAYED_RELEASE_TABLET | Freq: Two times a day (BID) | ORAL | Status: DC
  Administered 2013-10-07: 325 mg via ORAL
  Filled 2013-10-06 (×5): qty 1

## 2013-10-06 MED ORDER — EPHEDRINE SULFATE 50 MG/ML IJ SOLN
INTRAMUSCULAR | Status: DC | PRN
  Administered 2013-10-06 (×2): 10 mg via INTRAVENOUS

## 2013-10-06 MED ORDER — TRANEXAMIC ACID 100 MG/ML IV SOLN
1000.0000 mg | INTRAVENOUS | Status: AC
  Administered 2013-10-06: 1000 mg via INTRAVENOUS
  Filled 2013-10-06: qty 10

## 2013-10-06 MED ORDER — SODIUM CHLORIDE 0.9 % IV SOLN
INTRAVENOUS | Status: DC
  Administered 2013-10-06 – 2013-10-08 (×2): via INTRAVENOUS

## 2013-10-06 MED ORDER — ALPRAZOLAM 1 MG PO TABS
1.0000 mg | ORAL_TABLET | Freq: Two times a day (BID) | ORAL | Status: DC | PRN
  Administered 2013-10-07: 1 mg via ORAL
  Filled 2013-10-06: qty 1

## 2013-10-06 MED ORDER — MENTHOL 3 MG MT LOZG
1.0000 | LOZENGE | OROMUCOSAL | Status: DC | PRN
  Filled 2013-10-06: qty 9

## 2013-10-06 MED ORDER — DOCUSATE SODIUM 100 MG PO CAPS
100.0000 mg | ORAL_CAPSULE | Freq: Two times a day (BID) | ORAL | Status: DC
  Administered 2013-10-06 – 2013-10-09 (×6): 100 mg via ORAL

## 2013-10-06 MED ORDER — MEPERIDINE HCL 50 MG/ML IJ SOLN: 6.2500 mg | INTRAMUSCULAR | Status: DC | PRN

## 2013-10-06 MED ORDER — BUPIVACAINE LIPOSOME 1.3 % IJ SUSP
20.0000 mL | Freq: Once | INTRAMUSCULAR | Status: DC
  Filled 2013-10-06: qty 20

## 2013-10-06 MED ORDER — FENTANYL CITRATE 0.05 MG/ML IJ SOLN
INTRAMUSCULAR | Status: DC | PRN
  Administered 2013-10-06 (×2): 50 ug via INTRAVENOUS

## 2013-10-06 MED ORDER — HYDROMORPHONE HCL PF 1 MG/ML IJ SOLN
0.2500 mg | INTRAMUSCULAR | Status: DC | PRN
  Administered 2013-10-06 (×4): 0.5 mg via INTRAVENOUS

## 2013-10-06 MED ORDER — OXYCODONE HCL 5 MG PO TABS
5.0000 mg | ORAL_TABLET | ORAL | Status: DC | PRN
  Administered 2013-10-06: 10 mg via ORAL
  Administered 2013-10-07 (×3): 15 mg via ORAL
  Administered 2013-10-07 – 2013-10-08 (×2): 10 mg via ORAL
  Administered 2013-10-08: 15 mg via ORAL
  Administered 2013-10-08 – 2013-10-09 (×4): 10 mg via ORAL
  Administered 2013-10-09: 15 mg via ORAL
  Administered 2013-10-09: 10 mg via ORAL
  Filled 2013-10-06: qty 3
  Filled 2013-10-06 (×3): qty 2
  Filled 2013-10-06 (×2): qty 3
  Filled 2013-10-06: qty 2
  Filled 2013-10-06 (×2): qty 3
  Filled 2013-10-06 (×2): qty 2
  Filled 2013-10-06: qty 3
  Filled 2013-10-06: qty 2

## 2013-10-06 MED ORDER — HYDROMORPHONE HCL PF 1 MG/ML IJ SOLN
INTRAMUSCULAR | Status: AC
  Filled 2013-10-06: qty 1

## 2013-10-06 MED ORDER — PANTOPRAZOLE SODIUM 40 MG PO TBEC
80.0000 mg | DELAYED_RELEASE_TABLET | Freq: Every day | ORAL | Status: DC
  Administered 2013-10-07 – 2013-10-09 (×3): 80 mg via ORAL
  Filled 2013-10-06 (×4): qty 2

## 2013-10-06 MED ORDER — SORBITOL 70 % SOLN
30.0000 mL | Freq: Every day | Status: DC | PRN
  Filled 2013-10-06: qty 30

## 2013-10-06 MED ORDER — ACETAMINOPHEN 325 MG PO TABS: 650.0000 mg | ORAL_TABLET | Freq: Four times a day (QID) | ORAL | Status: DC | PRN

## 2013-10-06 MED ORDER — OXYCODONE HCL 5 MG/5ML PO SOLN
5.0000 mg | Freq: Once | ORAL | Status: DC | PRN
  Filled 2013-10-06: qty 5

## 2013-10-06 MED ORDER — CEFAZOLIN SODIUM-DEXTROSE 2-3 GM-% IV SOLR
2.0000 g | Freq: Four times a day (QID) | INTRAVENOUS | Status: AC
  Administered 2013-10-06 – 2013-10-07 (×2): 2 g via INTRAVENOUS
  Filled 2013-10-06 (×2): qty 50

## 2013-10-06 MED ORDER — MIDAZOLAM HCL 2 MG/2ML IJ SOLN
INTRAMUSCULAR | Status: AC
  Filled 2013-10-06: qty 2

## 2013-10-06 MED ORDER — METHOCARBAMOL 500 MG PO TABS: 500.0000 mg | ORAL_TABLET | Freq: Four times a day (QID) | ORAL | Status: DC | PRN

## 2013-10-06 MED ORDER — ONDANSETRON HCL 4 MG/2ML IJ SOLN
4.0000 mg | Freq: Four times a day (QID) | INTRAMUSCULAR | Status: DC | PRN
  Administered 2013-10-06 – 2013-10-07 (×2): 4 mg via INTRAVENOUS
  Filled 2013-10-06 (×2): qty 2

## 2013-10-06 MED ORDER — ACETAMINOPHEN 10 MG/ML IV SOLN
1000.0000 mg | Freq: Once | INTRAVENOUS | Status: AC
  Administered 2013-10-06: 1000 mg via INTRAVENOUS
  Filled 2013-10-06: qty 100

## 2013-10-06 MED ORDER — OXYCODONE HCL 5 MG PO TABS: 5.0000 mg | ORAL_TABLET | Freq: Once | ORAL | Status: DC | PRN

## 2013-10-06 SURGICAL SUPPLY — 66 items
APL SKNCLS STERI-STRIP NONHPOA (GAUZE/BANDAGES/DRESSINGS)
BAG SPEC THK2 15X12 ZIP CLS (MISCELLANEOUS)
BAG ZIPLOCK 12X15 (MISCELLANEOUS) IMPLANT
BANDAGE ELASTIC 6 VELCRO ST LF (GAUZE/BANDAGES/DRESSINGS) ×2 IMPLANT
BANDAGE ESMARK 6X9 LF (GAUZE/BANDAGES/DRESSINGS) ×1 IMPLANT
BENZOIN TINCTURE PRP APPL 2/3 (GAUZE/BANDAGES/DRESSINGS) IMPLANT
BLADE SAG 18X100X1.27 (BLADE) ×2 IMPLANT
BNDG CMPR 9X6 STRL LF SNTH (GAUZE/BANDAGES/DRESSINGS) ×1
BNDG ESMARK 6X9 LF (GAUZE/BANDAGES/DRESSINGS) ×2
BOWL SMART MIX CTS (DISPOSABLE) ×2 IMPLANT
CAP KNEE TOTAL OXINIUM W/POLY ×2 IMPLANT
CEMENT BONE 1-PACK (Cement) ×4 IMPLANT
CUFF TOURN SGL QUICK 34 (TOURNIQUET CUFF)
CUFF TOURN SGL QUICK 44 (TOURNIQUET CUFF) ×2 IMPLANT
CUFF TRNQT CYL 34X4X40X1 (TOURNIQUET CUFF) IMPLANT
DRAPE EXTREMITY T 121X128X90 (DRAPE) ×2 IMPLANT
DRAPE LG THREE QUARTER DISP (DRAPES) ×2 IMPLANT
DRAPE POUCH INSTRU U-SHP 10X18 (DRAPES) ×2 IMPLANT
DRAPE U-SHAPE 47X51 STRL (DRAPES) ×2 IMPLANT
DRSG AQUACEL AG ADV 3.5X10 (GAUZE/BANDAGES/DRESSINGS) IMPLANT
DRSG PAD ABDOMINAL 8X10 ST (GAUZE/BANDAGES/DRESSINGS) ×2 IMPLANT
DRSG TEGADERM 4X4.75 (GAUZE/BANDAGES/DRESSINGS) IMPLANT
DRSG XEROFORM 1X8 (GAUZE/BANDAGES/DRESSINGS) ×2 IMPLANT
DURAPREP 26ML APPLICATOR (WOUND CARE) ×2 IMPLANT
ELECT REM PT RETURN 9FT ADLT (ELECTROSURGICAL) ×2
ELECTRODE REM PT RTRN 9FT ADLT (ELECTROSURGICAL) ×1 IMPLANT
EVACUATOR 1/8 PVC DRAIN (DRAIN) ×2 IMPLANT
FACESHIELD WRAPAROUND (MASK) ×8 IMPLANT
GAUZE SPONGE 2X2 8PLY STRL LF (GAUZE/BANDAGES/DRESSINGS) IMPLANT
GAUZE XEROFORM 1X8 LF (GAUZE/BANDAGES/DRESSINGS) ×2 IMPLANT
GLOVE BIO SURGEON STRL SZ7.5 (GLOVE) ×4 IMPLANT
GLOVE BIOGEL PI IND STRL 8 (GLOVE) ×3 IMPLANT
GLOVE BIOGEL PI INDICATOR 8 (GLOVE) ×3
GLOVE ECLIPSE 8.0 STRL XLNG CF (GLOVE) ×2 IMPLANT
GOWN STRL REUS W/TWL XL LVL3 (GOWN DISPOSABLE) ×4 IMPLANT
HANDPIECE INTERPULSE COAX TIP (DISPOSABLE) ×2
IMMOBILIZER KNEE 20 (SOFTGOODS) ×4 IMPLANT
IMMOBILIZER KNEE 20 THIGH 36 (SOFTGOODS) ×1 IMPLANT
KIT BASIN OR (CUSTOM PROCEDURE TRAY) ×2 IMPLANT
NEEDLE HYPO 21X1.5 SAFETY (NEEDLE) IMPLANT
NS IRRIG 1000ML POUR BTL (IV SOLUTION) ×2 IMPLANT
PACK TOTAL JOINT (CUSTOM PROCEDURE TRAY) ×2 IMPLANT
PAD ABD 8X10 STRL (GAUZE/BANDAGES/DRESSINGS) ×2 IMPLANT
PADDING CAST COTTON 6X4 STRL (CAST SUPPLIES) ×4 IMPLANT
PIN TROCAR 3 INCH (PIN) ×2 IMPLANT
POSITIONER SURGICAL ARM (MISCELLANEOUS) ×2 IMPLANT
SET HNDPC FAN SPRY TIP SCT (DISPOSABLE) ×1 IMPLANT
SET PAD KNEE POSITIONER (MISCELLANEOUS) ×2 IMPLANT
SPONGE GAUZE 2X2 STER 10/PKG (GAUZE/BANDAGES/DRESSINGS)
SPONGE GAUZE 4X4 12PLY (GAUZE/BANDAGES/DRESSINGS) ×2 IMPLANT
STAPLER VISISTAT 35W (STAPLE) ×2 IMPLANT
STRIP CLOSURE SKIN 1/2X4 (GAUZE/BANDAGES/DRESSINGS) IMPLANT
SUCTION FRAZIER 12FR DISP (SUCTIONS) ×2 IMPLANT
SUT MNCRL AB 4-0 PS2 18 (SUTURE) IMPLANT
SUT VIC AB 0 CT1 27 (SUTURE) ×2
SUT VIC AB 0 CT1 27XBRD ANTBC (SUTURE) ×2 IMPLANT
SUT VIC AB 1 CT1 27 (SUTURE) ×6
SUT VIC AB 1 CT1 27XBRD ANTBC (SUTURE) ×3 IMPLANT
SUT VIC AB 2-0 CT1 27 (SUTURE) ×6
SUT VIC AB 2-0 CT1 TAPERPNT 27 (SUTURE) ×3 IMPLANT
SYR 50ML LL SCALE MARK (SYRINGE) IMPLANT
TOWEL OR 17X26 10 PK STRL BLUE (TOWEL DISPOSABLE) ×2 IMPLANT
TOWEL OR NON WOVEN STRL DISP B (DISPOSABLE) IMPLANT
TRAY FOLEY CATH 14FRSI W/METER (CATHETERS) ×2 IMPLANT
WATER STERILE IRR 1500ML POUR (IV SOLUTION) ×2 IMPLANT
WRAP KNEE MAXI GEL POST OP (GAUZE/BANDAGES/DRESSINGS) ×2 IMPLANT

## 2013-10-06 NOTE — Brief Op Note (Signed)
10/06/2013  5:08 PM  PATIENT:  Crystal Garza  43 y.o. female  PRE-OPERATIVE DIAGNOSIS:  severe osteoarthritis left knee  POST-OPERATIVE DIAGNOSIS:  severe osteoarthritis left knee  PROCEDURE:  Procedure(s): LEFT TOTAL KNEE ARTHROPLASTY (Left)  SURGEON:  Surgeon(s) and Role:    * Mcarthur Rossetti, MD - Primary  PHYSICIAN ASSISTANT: Benita Stabile, PA-c  ANESTHESIA:   spinal  EBL:  Total I/O In: 1000 [I.V.:1000] Out: 350 [Urine:300; Blood:50]  BLOOD ADMINISTERED:none  DRAINS: none   LOCAL MEDICATIONS USED:  NONE  SPECIMEN:  No Specimen  DISPOSITION OF SPECIMEN:  N/A  COUNTS:  YES  TOURNIQUET:   Total Tourniquet Time Documented: Thigh (Left) - 72 minutes Total: Thigh (Left) - 72 minutes   DICTATION: .Other Dictation: Dictation Number [id number]  PLAN OF CARE: Admit to inpatient   PATIENT DISPOSITION:  PACU - hemodynamically stable.   Delay start of Pharmacological VTE agent (>24hrs) due to surgical blood loss or risk of bleeding: no

## 2013-10-06 NOTE — Transfer of Care (Signed)
Immediate Anesthesia Transfer of Care Note  Patient: Crystal Garza  Procedure(s) Performed: Procedure(s) (LRB): LEFT TOTAL KNEE ARTHROPLASTY (Left)  Patient Location: PACU  Anesthesia Type: Spinal  Level of Consciousness: sedated, patient cooperative and responds to stimulation  Airway & Oxygen Therapy: Patient Spontanous Breathing and Patient connected to face mask oxgen  Post-op Assessment: Report given to PACU RN and Post -op Vital signs reviewed and stable  Post vital signs: Reviewed and stable  Complications: No apparent anesthesia complications

## 2013-10-06 NOTE — Anesthesia Procedure Notes (Signed)
Spinal  Patient location during procedure: OR Start time: 10/06/2013 2:52 PM End time: 10/06/2013 3:06 PM Staffing Anesthesiologist: GERMEROTH, JOHN R CRNA/Resident: ,  E Performed by: anesthesiologist and resident/CRNA  Preanesthetic Checklist Completed: patient identified, site marked, surgical consent, pre-op evaluation, timeout performed, IV checked, risks and benefits discussed and monitors and equipment checked Spinal Block Patient position: sitting Prep: Betadine Patient monitoring: heart rate, continuous pulse ox and blood pressure Approach: midline Location: L3-4 Injection technique: single-shot Needle Needle type: Sprotte and Spinocan  Needle gauge: 22 G Needle length: 10 cm Assessment Sensory level: T8 Additional Notes Kit expiration checked, time out performed, sterile prep and drape. Attempt SAB x2 by CRNA w/o success, Dr. Germeroth x2, positive, clear CSF, neg heme, neg paresthesia. Pt. tol well. Return to supine.   

## 2013-10-06 NOTE — H&P (Signed)
TOTAL KNEE ADMISSION H&P  Patient is being admitted for left total knee arthroplasty.  Subjective:  Chief Complaint:left knee pain.  HPI: Crystal Garza, 43 y.o. female, has a history of pain and functional disability in the left knee due to arthritis and has failed non-surgical conservative treatments for greater than 12 weeks to includeNSAID's and/or analgesics, corticosteriod injections, viscosupplementation injections, flexibility and strengthening excercises, use of assistive devices, weight reduction as appropriate and activity modification.  Onset of symptoms was gradual, starting 3 years ago with gradually worsening course since that time. The patient noted no past surgery on the left knee(s).  Patient currently rates pain in the left knee(s) at 10 out of 10 with activity. Patient has night pain, worsening of pain with activity and weight bearing, pain that interferes with activities of daily living, pain with passive range of motion, crepitus and joint swelling.  Patient has evidence of subchondral sclerosis, periarticular osteophytes and joint space narrowing by imaging studies. There is no active infection.  Patient Active Problem List   Diagnosis Date Noted  . Degenerative arthritis of left knee 10/06/2013  . Ear popping 08/18/2013  . Edema 07/28/2013  . Chronic pain 06/13/2013  . 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 02/17/2012  . Bipolar disorder 01/19/2012  . Nephrolithiasis 02/20/2011  . Eczematous dermatitis 10/22/2010  . Iron deficiency anemia 06/27/2010  . Anxiety 06/27/2010  . MIGRAINE, CHRONIC 09/12/2009  . Morbid obesity-BMI 62 10/30/2008  . OSTEOARTHRITIS, KNEES, BILATERAL 07/05/2007  . DEPRESSION, MAJOR, RECURRENT 07/01/2006  . HYPERTENSION, BENIGN SYSTEMIC 07/01/2006  . RHINITIS, ALLERGIC 07/01/2006  . GASTROESOPHAGEAL REFLUX, NO ESOPHAGITIS 07/01/2006    Past Medical History  Diagnosis Date  . Anemia     iron def  . Hypertension   . Morbid obesity   . GERD (gastroesophageal reflux disease)   . Anxiety   . Depression   . Migraine   . Back pain   . Chronic knee pain   . Arthritis     left knee   . Hx of laparoscopic gastric banding   . Anginal pain     Past Surgical History  Procedure Laterality Date  . Abdominal hysterectomy    . Knee arthroscopy    . Cesarean section  05/1991  . Breath tek h pylori  07/28/2011    Procedure: BREATH TEK H PYLORI;  Surgeon: [name], MD;  Location: [facility];  Service: General;  Laterality: N/A;  to be done at 745  . Esophageal biopsy  02/16/2012    Procedure: BIOPSY;  Surgeon: [name], MD;  Location: [facility];  Service: General;;  biopsy of mass x 2  . Eus  03/03/2012    Procedure: UPPER ENDOSCOPIC ULTRASOUND (EUS) LINEAR;  Surgeon: Milus Banister, MD;  Location: [facility];  Service: Endoscopy;  Laterality: N/A;  . Tubal ligation    . Laparoscopic gastrectomy  04/08/2012    Procedure: LAPAROSCOPIC GASTRECTOMY;  Surgeon: [name], MD;  Location: [facility];  Service: General;;  removal of GIST tumor of stomach  . Laparoscopic gastric sleeve resection N/A 08/29/2012    Procedure: LAPAROSCOPIC GASTRIC SLEEVE RESECTION;  Surgeon: [name], MD;  Location: [facility];  Service: General;  Laterality: N/A;  Sleeve Gastrectomy  . Esophagogastroduodenoscopy N/A 08/29/2012    Procedure: ESOPHAGOGASTRODUODENOSCOPY (EGD);  Surgeon: [name], MD;  Location: [facility];  Service: General;  Laterality: N/A;  No prescriptions prior to admission   Allergies  Allergen Reactions  . Codeine Rash    History  Substance Use Topics  . Smoking status: Never Smoker   . Smokeless tobacco: Never Used  . Alcohol Use: Yes     Comment: maybe once a month    Family History  Problem Relation Age of Onset  . Diabetes Mother   . Cancer Father     brain tumor, colon  . Diabetes  Father   . Heart disease Father   . Heart disease Paternal Uncle   . Diabetes Paternal Uncle   . Diabetes Maternal Grandmother   . Heart disease Paternal Grandmother   . Heart disease Paternal Grandfather   . Diabetes Maternal Aunt   . Diabetes Maternal Uncle   . Diabetes Paternal Aunt   . Diabetes Maternal Grandfather      Review of Systems  Musculoskeletal: Positive for joint pain.  All other systems reviewed and are negative.   Objective:  Physical Exam  Constitutional: She is oriented to person, place, and time. She appears well-developed and well-nourished.  HENT:  Head: Normocephalic and atraumatic.  Eyes: EOM are normal. Pupils are equal, round, and reactive to light.  Neck: Normal range of motion.  Cardiovascular: Normal rate and regular rhythm.   Respiratory: Effort normal and breath sounds normal.  GI: Soft. Bowel sounds are normal.  Musculoskeletal:       Left knee: She exhibits decreased range of motion, effusion and abnormal alignment. Tenderness found. Medial joint line and lateral joint line tenderness noted.  Neurological: She is alert and oriented to person, place, and time.  Skin: Skin is warm and dry.  Psychiatric: She has a normal mood and affect.    Vital signs in last 24 hours:    Labs:   Estimated body mass index is 49.79 kg/(m^2) as calculated from the following:   Height as of 09/13/13: 5\' 7"  (1.702 m).   Weight as of 09/13/13: 144.244 kg (318 lb).   Imaging Review Plain radiographs demonstrate severe degenerative joint disease of the left knee(s). The overall alignment ismild varus. The bone quality appears to be good for age and reported activity level.  Assessment/Plan:  End stage arthritis, left knee   The patient history, physical examination, clinical judgment of the provider and imaging studies are consistent with end stage degenerative joint disease of the left knee(s) and total knee arthroplasty is deemed medically necessary. The  treatment options including medical management, injection therapy arthroscopy and arthroplasty were discussed at length. The risks and benefits of total knee arthroplasty were presented and reviewed. The risks due to aseptic loosening, infection, stiffness, patella tracking problems, thromboembolic complications and other imponderables were discussed. The patient acknowledged the explanation, agreed to proceed with the plan and consent was signed. Patient is being admitted for inpatient treatment for surgery, pain control, PT, OT, prophylactic antibiotics, VTE prophylaxis, progressive ambulation and ADL's and discharge planning. The patient is planning to be discharged home with home health services

## 2013-10-06 NOTE — Anesthesia Preprocedure Evaluation (Addendum)
Anesthesia Evaluation  Patient identified by MRN, date of birth, ID band Patient awake    Reviewed: Allergy & Precautions, H&P , NPO status , Patient's Chart, lab work & pertinent test results  Airway Mallampati: II TM Distance: >3 FB Neck ROM: full    Dental  (+) Dental Advisory Given, Edentulous Upper   Pulmonary neg pulmonary ROS,  breath sounds clear to auscultation  Pulmonary exam normal       Cardiovascular hypertension, Pt. on medications + angina Rhythm:regular Rate:Normal     Neuro/Psych  Headaches, PSYCHIATRIC DISORDERS Anxiety Depression Bipolar Disorder    GI/Hepatic negative GI ROS, Neg liver ROS, GERD-  Medicated and Controlled,  Endo/Other  Morbid obesity  Renal/GU Renal diseaseNephrolithisis      Musculoskeletal   Abdominal (+) + obese,   Peds  Hematology negative hematology ROS (+) anemia ,   Anesthesia Other Findings   Reproductive/Obstetrics negative OB ROS                          Anesthesia Physical  Anesthesia Plan  ASA: III  Anesthesia Plan: Spinal   Post-op Pain Management:    Induction: Intravenous  Airway Management Planned:   Additional Equipment:   Intra-op Plan:   Post-operative Plan: Extubation in OR  Informed Consent: I have reviewed the patients History and Physical, chart, labs and discussed the procedure including the risks, benefits and alternatives for the proposed anesthesia with the patient or authorized representative who has indicated his/her understanding and acceptance.   Dental advisory given  Plan Discussed with: CRNA  Anesthesia Plan Comments:        Anesthesia Quick Evaluation

## 2013-10-07 LAB — CBC
HCT: 35 % — ABNORMAL LOW (ref 36.0–46.0)
Hemoglobin: 11.2 g/dL — ABNORMAL LOW (ref 12.0–15.0)
MCH: 27.1 pg (ref 26.0–34.0)
MCHC: 32 g/dL (ref 30.0–36.0)
MCV: 84.5 fL (ref 78.0–100.0)
Platelets: 398 10*3/uL (ref 150–400)
RBC: 4.14 MIL/uL (ref 3.87–5.11)
RDW: 14.4 % (ref 11.5–15.5)
WBC: 13.7 10*3/uL — ABNORMAL HIGH (ref 4.0–10.5)

## 2013-10-07 LAB — BASIC METABOLIC PANEL
BUN: 5 mg/dL — ABNORMAL LOW (ref 6–23)
CO2: 26 mEq/L (ref 19–32)
Calcium: 8.9 mg/dL (ref 8.4–10.5)
Chloride: 102 mEq/L (ref 96–112)
Creatinine, Ser: 0.64 mg/dL (ref 0.50–1.10)
GFR calc Af Amer: >90 mL/min (ref >90)
GFR calc non Af Amer: >90 mL/min (ref >90)
Glucose, Bld: 114 mg/dL — ABNORMAL HIGH (ref 70–99)
Potassium: 4.1 mEq/L (ref 3.7–5.3)
Sodium: 139 mEq/L (ref 137–147)

## 2013-10-07 NOTE — Progress Notes (Signed)
Subjective: 1 Day Post-Op Procedure(s) (LRB): LEFT TOTAL KNEE ARTHROPLASTY (Left) Patient reports pain as moderate.    Objective: Vital signs in last 24 hours: Temp:  [97.5 F (36.4 C)-98.6 F (37 C)] 98.1 F (36.7 C) (06/06 0624) Pulse Rate:  [71-115] 113 (06/06 0624) Resp:  [10-18] 16 (06/06 0624) BP: (104-154)/(57-83) 136/81 mmHg (06/06 0624) SpO2:  [96 %-100 %] 100 % (06/06 0624) Weight:  [145.151 kg (320 lb)] 145.151 kg (320 lb) (06/05 2000)  Intake/Output from previous day: 06/05 0701 - 06/06 0700 In: 2770 [I.V.:2615; IV Piggyback:155] Out: 1300 [Urine:1250; Blood:50] Intake/Output this shift:     Recent Labs  10/07/13 0527  HGB 11.2*    Recent Labs  10/07/13 0527  WBC 13.7*  RBC 4.14  HCT 35.0*  PLT 398    Recent Labs  10/07/13 0527  NA 139  K 4.1  CL 102  CO2 26  BUN 5*  CREATININE 0.64  GLUCOSE 114*  CALCIUM 8.9   No results found for this basename: LABPT, INR,  in the last 72 hours  Sensation intact distally Intact pulses distally Dorsiflexion/Plantar flexion intact Incision: dressing C/D/I calf soft  Assessment/Plan: 1 Day Post-Op Procedure(s) (LRB): LEFT TOTAL KNEE ARTHROPLASTY (Left) Up with therapy  Crystal Garza 10/07/2013, 9:41 AM

## 2013-10-07 NOTE — Anesthesia Postprocedure Evaluation (Signed)
Anesthesia Post Note  Patient: Crystal Garza  Procedure(s) Performed: Procedure(s) (LRB): LEFT TOTAL KNEE ARTHROPLASTY (Left)  Anesthesia type: Spinal  Patient location: PACU  Post pain: Pain level controlled  Post assessment: Post-op Vital signs reviewed  Last Vitals: BP 138/80  Pulse 98  Temp(Src) 36.5 C (Oral)  Resp 16  Ht 5\' 7"  (1.702 m)  Wt 320 lb (145.151 kg)  BMI 50.11 kg/m2  SpO2 99%  Post vital signs: Reviewed  Level of consciousness: sedated  Complications: No apparent anesthesia complications

## 2013-10-07 NOTE — Op Note (Signed)
NAMESHEMICKA, COHRS NO.:  [id number]  MEDICAL RECORD NO.:  [id number]  LOCATION:  9509                         FACILITY:  [facility]  PHYSICIAN:  Lind Guest. 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  PROCEDURE:  Left total knee arthroplasty.  IMPLANTS:  [employer] LEGION knee system with size 4 femur, size 3 tibial tray, 11-mm fixed-bearing polyethylene insert, size 29 patellar button.  SURGEON:  Lind Guest. Ninfa Linden, M.D.  ASSISTANT:  Erskine Emery, PA-C  ANESTHESIA:  Spinal.  BLOOD LOSS:  Less than 100 mL.  TOURNIQUET TIME:  Less than 2 hours.  ANTIBIOTICS:  3 g of IV Ancef.  COMPLICATIONS:  None.  INDICATIONS:  [name] is only a 43 year old female, morbidly obese, who has a severe worsening valgus deformity of her left knee.  X-rays of the years have now shown complete loss of lateral joint space with patellofemoral disease and tricompartmental arthritis.  Her pain is daily, severe.  She understands that her age and obesity are significant risk to this surgery, but her quality of life has been diminished so much and her ADLs that she wished to proceed with a total knee arthroplasty.  I told her significantly about the risk of infection, failure of the implants, fracture in the knee, wearing out and this lifetime given her weight.  She understands the risk of DVT, acute blood loss anemia, nerve and vessel injury as well.  She knows that the goals are decreased pain, improved mobility and overall improved quality of life, but certainly the risk of this are high and she understands these risks and is  willing to proceed with surgery.  PROCEDURE DESCRIPTION:  After informed consent was obtained and appropriate left knee was marked, she was brought to the operating room and spinal anesthesia was obtained while she was on her stretcher.  She was then placed supine on the operating table.  Her left operative leg, had a nonsterile tourniquet was placed on the upper thigh.  The leg was prepped and draped with DuraPrep and sterile drapes including a sterile stockinette.  A time-out was called to identify the correct patient and correct left knee.  We then used an Esmarch to wrap out the leg and the tourniquet was inflated to 300 mm of pressure.  We then made a direct midline incision over the knee and carried this proximally and distally. I dissected down the knee joint and performed a medial parapatellar arthrotomy.  We drained an infusion from the knee and found a valgus deformity of the knee with complete loss of the cartilage in most areas of the knee.  We removed osteophytes from the knee as well as remnants of the medial and lateral meniscus.  We cleaned debris from the knee in general.  Next, we used an extramedullary cutting guide for the tibia and with the knee in a flexed position,  neutral slope, we were able to correct the varus and valgus and take 9-mm off the high side making our tibial cut.  We then used an intramedullary guide for our femoral cut and for 3 degrees right and 9-mm distal femur resection made our distal femoral cut.  We brought the leg back down into extension with an 11-mm extension block.  I was very pleased with the alignment.  We went back to the femur and put a sizing guide and sized for a size 4 femur.  We placed the trial 4 femur and made our anterior and posterior cuts followed by our chamfer cuts.  We then made our box cut.  We then went back to the tibia, and we placed the size 3 tibia for our tibial trials, I was pleased with this alignment, so I made  our keel and punch for the tibia.  With the trial 4 femur and then trial 3 tibia in place, we trialed 11-mm polyethylene insert, and I was pleased with alignment and range of motion.  We then made our patellar cut, taking 11 mm thickness of the patella and drilled for a size 29 patellar button.  We then removed all trial components and copiously irrigated the knee with normal saline solution.  We then cemented the real LEGION Triathlon knee from [employer] with the size 3 tibial tray followed by the size 4 femur.  We cleaned the knee of cement debris and then placed the real 11- mm fixed-bearing polyethylene insert and cemented the patellar button. Once the cement had dried, we let the tourniquet down.  Hemostasis was obtained with electrocautery.  We then irrigated the knee again with another liter of normal saline solution.  We closed the arthrotomy with interrupted #1 Vicryl suture followed by 0 Vicryl in the deep tissue, 2- 0 Vicryl in the subcutaneous tissue, and staples on the skin.  Well- padded sterile dressings applied, and she was taken to the recovery room in stable condition.  All final counts were correct.  There were no complications noted.  Of note, Erskine Emery, PA-C assisted during the entire case and his assistance was crucial for facilitating this case.     Lind Guest. Ninfa Linden, M.D.     CYB/MEDQ  D:  10/06/2013  T:  10/07/2013  Job:  [id number]

## 2013-10-07 NOTE — Evaluation (Signed)
Physical Therapy Evaluation Patient Details Name: Crystal Garza MRN: [id number] DOB: 08-Jul-1970 Today's Date: 10/07/2013   History of Present Illness  43 y/o obese female s/p L TKA.  Clinical Impression  Pt admitted with L TKA due to arthritis. Pt currently with functional limitations due to the deficits listed below (see PT Problem List).  Pt will benefit from skilled PT to increase their independence and safety with mobility to allow discharge to the venue listed below. Evaluation was limited due to elevated HR ranging from 115-133 with little mobility.  Discussed with nursing and deferred mobility until HR went down.  Checked on pt 90 mins later and HR 116 while sleeping.     Follow Up Recommendations Home health PT    Equipment Recommendations  Rolling walker with 5" wheels;3in1 (PT)    Recommendations for Other Services       Precautions / Restrictions Precautions Required Braces or Orthoses: Knee Immobilizer - Left Restrictions Weight Bearing Restrictions: Yes LLE Weight Bearing: Weight bearing as tolerated      Mobility  Bed Mobility Overal bed mobility: Needs Assistance Bed Mobility: Rolling Rolling: Mod assist         General bed mobility comments: Rolling to change gown and HR up to 133 with small roll to side.    Transfers                    Ambulation/Gait                Stairs            Wheelchair Mobility    Modified Rankin (Stroke Patients Only)       Balance                                             Pertinent Vitals/Pain Pain L knee 6/10 HR 115-133    Home Living Family/patient expects to be discharged to:: Private residence Living Arrangements: Spouse/significant other Available Help at Discharge: Family;Friend(s);Available 24 hours/day Type of Home: House Home Access: Stairs to enter Entrance Stairs-Rails: None Entrance Stairs-Number of Steps: 6-7 Home Layout: One level Home Equipment:  Walker - standard;Cane - single point      Prior Function Level of Independence: Independent with assistive device(s)         Comments: Amb with SW due to pain     Hand Dominance   Dominant Hand: Right    Extremity/Trunk Assessment   Upper Extremity Assessment: Defer to OT evaluation           Lower Extremity Assessment: LLE deficits/detail   LLE Deficits / Details: L AAROM 35 degreees, poor(+) quad set     Communication   Communication: No difficulties  Cognition Arousal/Alertness: Awake/alert Behavior During Therapy: WFL for tasks assessed/performed Overall Cognitive Status: Within Functional Limits for tasks assessed                      General Comments      Exercises Total Joint Exercises Ankle Circles/Pumps: AROM;Both;20 reps;Supine Quad Sets: Strengthening;Left;10 reps;Supine Heel Slides: AAROM;Left;10 reps;Supine Hip ABduction/ADduction: AAROM;Supine;Left;10 reps      Assessment/Plan    PT Assessment Patient needs continued PT services  PT Diagnosis Difficulty walking   PT Problem List Decreased strength;Decreased range of motion;Decreased activity tolerance;Decreased mobility  PT Treatment Interventions Gait training;Stair training;Functional mobility training;Therapeutic activities;Therapeutic exercise;DME  instruction   PT Goals (Current goals can be found in the Care Plan section) Acute Rehab PT Goals Patient Stated Goal: Get stronger and go home. PT Goal Formulation: With patient Time For Goal Achievement: 10/14/13 Potential to Achieve Goals: Good    Frequency 7X/week   Barriers to discharge        Co-evaluation               End of Session Equipment Utilized During Treatment: Gait belt Activity Tolerance: Treatment limited secondary to medical complications (Comment) (elevated HR- supine ther ex only)             Time: 4193-7902 PT Time Calculation (min): 21 min   Charges:   PT Evaluation $Initial PT  Evaluation Tier I: 1 Procedure PT Treatments $Therapeutic Exercise: 8-22 mins   PT G Codes:          Galen Manila 2013-10-11, 11:11 AM

## 2013-10-07 NOTE — Progress Notes (Signed)
CARE MANAGEMENT NOTE 10/07/2013  Patient:  TIMOTHY, TOWNSEL   Account Number:  [id number]  Date Initiated:  10/07/2013  Documentation initiated by:  [facility]  Subjective/Objective Assessment:   LEFT TOTAL KNEE ARTHROPLASTY     Action/Plan:   [place]   Anticipated DC Date:  10/09/2013   Anticipated DC Plan:  [place]  CM consult      [place] Choice  HOME HEALTH   Choice offered to / List presented to:  C-1 Patient   DME arranged  3-N-1  Vassie Moselle      DME agency  [place] arranged  HH-2 PT      [place]   Status of service:  Completed, signed off Medicare Important Message given?   (If response is "NO", the following Medicare IM given date fields will be blank) Date Medicare IM given:   Date Additional Medicare IM given:    Discharge Disposition:  [place]  Per UR Regulation:    If discussed at Long Length of Stay Meetings, dates discussed:    Comments:  10/07/2013 1420 NCM spoke to pt and offered choice for [facility]. Pt agreeable to [facility] for [facility]. Pt requested RW and 3n1 for home. Notified [facility] rep for DME for home. Has family at home to assist with her care. Jonnie Finner RN CCM Case Mgmt phone [phone]

## 2013-10-07 NOTE — Evaluation (Signed)
Occupational Therapy Evaluation Patient Details Name: Crystal Garza MRN: [id number] DOB: 1970/10/09 Today's Date: 10/07/2013    History of Present Illness 43 y/o obese female s/p L TKA.   Clinical Impression   This 43 year old female was admitted for L TKA.  She was independent with ADLs prior to admission and will have assistance as needed.  Will follow in acute to increase activity tolerance and further assess need for wide 3:1 vs. Her high commode.    Follow Up Recommendations  No OT follow up    Equipment Recommendations   (to be further assessed for 3:1 (wide).  Pt has 17 1/2" toilet)    Recommendations for Other Services       Precautions / Restrictions Precautions Required Braces or Orthoses: Knee Immobilizer - Left Restrictions Weight Bearing Restrictions: Yes LLE Weight Bearing: Weight bearing as tolerated      Mobility Bed Mobility Overal bed mobility: Needs Assistance Bed Mobility: Rolling Rolling: Mod assist   Supine to sit: Min assist;+2 for safety/equipment (using trapeze and rail)     General bed mobility comments: Rolling to change gown and HR up to 133 with small roll to side.    Transfers Overall transfer level: Needs assistance Equipment used: Rolling walker (2 wheeled) Transfers: Sit to/from Stand Sit to Stand: +2 safety/equipment;Min assist         General transfer comment: cues for UE/LE placement    Balance                                            ADL Overall ADL's : Needs assistance/impaired             Lower Body Bathing: Moderate assistance;Sit to/from stand;+2 for safety/equipment       Lower Body Dressing: Moderate assistance;+2 for safety/equipment;Sit to/from stand   Toilet Transfer: +2 for safety/equipment;Minimal assistance;BSC;Stand-pivot   Toileting- Clothing Manipulation and Hygiene: +2 for safety/equipment;Sit to/from stand;Minimal assistance         General ADL Comments: Pt can  perform UB adls with set up.  Boyfriend will assist at home, as needed.  Pt unable to lift LLE for adls but can reach to R foot and almost to L ankle.  Educated on sequence of dressing.  Pt's HR increased this session     Vision                     Perception     Praxis      Pertinent Vitals/Pain L knee 7/10.  Repositioned and ice applied.  Pt HR 111-150.  Ending 114.  RN aware     Hand Dominance Right   Extremity/Trunk Assessment Upper Extremity Assessment Upper Extremity Assessment: Overall WFL for tasks assessed          Communication Communication Communication: No difficulties   Cognition Arousal/Alertness: Awake/alert (periods of lethargy/dozing) Behavior During Therapy: WFL for tasks assessed/performed Overall Cognitive Status: Within Functional Limits for tasks assessed                     General Comments       Exercises       Shoulder Instructions      Home Living Family/patient expects to be discharged to:: Private residence Living Arrangements: Spouse/significant other Available Help at Discharge: Family;Friend(s);Available 24 hours/day Type of Home: House Home Access: Stairs to enter  Entrance Stairs-Number of Steps: 6-7 Entrance Stairs-Rails: None Home Layout: One level     Bathroom Shower/Tub: Tub/shower unit (old fashioned, high)   Bathroom Toilet: Handicapped height     Home Equipment: Environmental consultant - standard;Cane - single point   Additional Comments: pt will sponge bathe initially      Prior Functioning/Environment Level of Independence: Independent with assistive device(s)        Comments: Amb with SW due to pain    OT Diagnosis: Generalized weakness   OT Problem List: Decreased strength;Decreased activity tolerance;Decreased knowledge of use of DME or AE;Pain   OT Treatment/Interventions: Self-care/ADL training;DME and/or AE instruction;Patient/family education    OT Goals(Current goals can be found in the care  plan section) Acute Rehab OT Goals Patient Stated Goal: Get stronger and go home. OT Goal Formulation: With patient Time For Goal Achievement: 10/14/13 Potential to Achieve Goals: Good ADL Goals Pt Will Perform Grooming: with supervision;standing Pt Will Transfer to Toilet: with supervision;ambulating;bedside commode (vs comfort height commode)  OT Frequency: Min 2X/week   Barriers to D/C:            Co-evaluation PT/OT/SLP Co-Evaluation/Treatment: Yes Reason for Co-Treatment: For patient/therapist safety PT goals addressed during session: Mobility/safety with mobility OT goals addressed during session: ADL's and self-care      End of Session Nurse Communication: Mobility status  Activity Tolerance: Patient tolerated treatment well Patient left: in chair;with call bell/phone within reach   Time: 1203-1228 OT Time Calculation (min): 25 min Charges:  OT General Charges $OT Visit: 1 Procedure OT Evaluation $Initial OT Evaluation Tier I: 1 Procedure OT Treatments $Self Care/Home Management : 8-22 mins G-Codes:    Lesle Chris 10-19-13, 1:10 PM  Lesle Chris, OTR/L [phone] 19-Oct-2013

## 2013-10-07 NOTE — Plan of Care (Signed)
Problem: Consults Goal: Diagnosis- Total Joint Replacement Outcome: Completed/Met Date Met:  10/07/13 Primary Total Knee LEFT

## 2013-10-07 NOTE — Progress Notes (Addendum)
Physical Therapy Treatment Patient Details Name: Crystal Garza MRN: [id number] DOB: 08/17/1970 Today's Date: 10/07/2013    History of Present Illness 43 y/o obese female s/p L TKA.    PT Comments    Pt with HR 115-151 during session and unable to ambulate.  Pt did move well with SPT with MIN A of +2 for safety/equipment.  Once HR is down she should progress well with PT.  Pt was sleepy as well during session. Nursing aware of pt status.  Follow Up Recommendations  Home health PT     Equipment Recommendations  Rolling walker with 5" wheels;3in1 (PT)    Recommendations for Other Services       Precautions / Restrictions Precautions Required Braces or Orthoses: Knee Immobilizer - Left Restrictions Weight Bearing Restrictions: Yes LLE Weight Bearing: Weight bearing as tolerated    Mobility  Bed Mobility Overal bed mobility: Needs Assistance Bed Mobility: Rolling Rolling: Mod assist   Supine to sit: Min assist;+2 for safety/equipment     General bed mobility comments: Good use of strong leg to A with supine to sit  Transfers Overall transfer level: Needs assistance Equipment used: Rolling walker (2 wheeled) Transfers: Sit to/from Stand Sit to Stand: Min assist;+2 safety/equipment         General transfer comment: cues for proper technique. Transferred bed > BSC> recliner  Ambulation/Gait             General Gait Details: Deferred gait due to HR up to 151 with SPT bed > BSC > recliner   Stairs            Wheelchair Mobility    Modified Rankin (Stroke Patients Only)       Balance Overall balance assessment: Needs assistance Sitting-balance support: Bilateral upper extremity supported Sitting balance-Leahy Scale: Fair       Standing balance-Leahy Scale: Poor                      Cognition Arousal/Alertness: Awake/alert (periods of lethargy/dozing) Behavior During Therapy: WFL for tasks assessed/performed Overall Cognitive  Status: Within Functional Limits for tasks assessed                      Exercises     General Comments        Pertinent Vitals/Pain 7/10    Home Living Family/patient expects to be discharged to:: Private residence Living Arrangements: Spouse/significant other Available Help at Discharge: Family;Friend(s);Available 24 hours/day Type of Home: House Home Access: Stairs to enter Entrance Stairs-Rails: None Home Layout: One level Home Equipment: Walker - standard;Cane - single point Additional Comments: pt will sponge bathe initially    Prior Function Level of Independence: Independent with assistive device(s)      Comments: Amb with SW due to pain   PT Goals (current goals can now be found in the care plan section) Acute Rehab PT Goals Patient Stated Goal: Get stronger and go home. PT Goal Formulation: With patient Time For Goal Achievement: 10/14/13 Potential to Achieve Goals: Good Progress towards PT goals: Progressing toward goals    Frequency  7X/week    PT Plan Current plan remains appropriate    Co-evaluation   Reason for Co-Treatment: For patient/therapist safety PT goals addressed during session: Mobility/safety with mobility OT goals addressed during session: ADL's and self-care     End of Session Equipment Utilized During Treatment: Gait belt Activity Tolerance: Treatment limited secondary to medical complications (Comment) (HR up to 151  with SPT.) Patient left: in chair     Time: 1206-1233 PT Time Calculation (min): 27 min  Charges:  $Therapeutic Exercise: 8-22 mins $Therapeutic Activity: 8-22 mins                    G Codes:      Galen Manila Oct 17, 2013, 2:57 PM

## 2013-10-08 ENCOUNTER — Inpatient Hospital Stay (HOSPITAL_COMMUNITY): Payer: Medicare Other

## 2013-10-08 DIAGNOSIS — D509 Iron deficiency anemia, unspecified: Secondary | ICD-10-CM

## 2013-10-08 DIAGNOSIS — R000 Tachycardia, unspecified: Secondary | ICD-10-CM

## 2013-10-08 DIAGNOSIS — F319 Bipolar disorder, unspecified: Secondary | ICD-10-CM

## 2013-10-08 DIAGNOSIS — F411 Generalized anxiety disorder: Secondary | ICD-10-CM

## 2013-10-08 DIAGNOSIS — K219 Gastro-esophageal reflux disease without esophagitis: Secondary | ICD-10-CM

## 2013-10-08 DIAGNOSIS — I82403 Acute embolism and thrombosis of unspecified deep veins of lower extremity, bilateral: Secondary | ICD-10-CM

## 2013-10-08 DIAGNOSIS — Z96659 Presence of unspecified artificial knee joint: Secondary | ICD-10-CM

## 2013-10-08 DIAGNOSIS — M79609 Pain in unspecified limb: Secondary | ICD-10-CM

## 2013-10-08 DIAGNOSIS — I10 Essential (primary) hypertension: Secondary | ICD-10-CM

## 2013-10-08 DIAGNOSIS — M1710 Unilateral primary osteoarthritis, unspecified knee: Secondary | ICD-10-CM

## 2013-10-08 DIAGNOSIS — IMO0002 Reserved for concepts with insufficient information to code with codable children: Secondary | ICD-10-CM

## 2013-10-08 LAB — CBC
HCT: 35.2 % — ABNORMAL LOW (ref 36.0–46.0)
Hemoglobin: 11.3 g/dL — ABNORMAL LOW (ref 12.0–15.0)
MCH: 27.3 pg (ref 26.0–34.0)
MCHC: 32.1 g/dL (ref 30.0–36.0)
MCV: 85 fL (ref 78.0–100.0)
Platelets: 331 10*3/uL (ref 150–400)
RBC: 4.14 MIL/uL (ref 3.87–5.11)
RDW: 14.7 % (ref 11.5–15.5)
WBC: 16.3 10*3/uL — ABNORMAL HIGH (ref 4.0–10.5)

## 2013-10-08 LAB — D-DIMER, QUANTITATIVE: D-Dimer, Quant: 2.93 ug/mL-FEU — ABNORMAL HIGH (ref 0.00–0.48)

## 2013-10-08 MED ORDER — IOHEXOL 350 MG/ML SOLN
100.0000 mL | Freq: Once | INTRAVENOUS | Status: AC | PRN
  Administered 2013-10-08: 100 mL via INTRAVENOUS

## 2013-10-08 MED ORDER — RIVAROXABAN 15 MG PO TABS
15.0000 mg | ORAL_TABLET | Freq: Two times a day (BID) | ORAL | Status: DC
  Administered 2013-10-09: 15 mg via ORAL
  Filled 2013-10-08 (×4): qty 1

## 2013-10-08 MED ORDER — ENOXAPARIN SODIUM 150 MG/ML ~~LOC~~ SOLN
1.0000 mg/kg | Freq: Two times a day (BID) | SUBCUTANEOUS | Status: DC
  Administered 2013-10-08: 145 mg via SUBCUTANEOUS
  Filled 2013-10-08 (×3): qty 1

## 2013-10-08 NOTE — Progress Notes (Signed)
Notified Dr. Lorin Mercy of pt elevated HR. HR has been increasing over the past 24 hours. Currently, Pt heart rate 130 while sleeping and increases to 140s - 150s with activity. BP 119/70 Temp 100.4 RR 20, O2 sat 98% on room air. No acute distress noted. EKG, D.Dimer, Chest x-ray ordered. Will continue to monitor.

## 2013-10-08 NOTE — Progress Notes (Signed)
ANTICOAGULATION CONSULT NOTE - Initial Consult  Pharmacy Consult for Lovenox Indication: Elevated d-dimer  Allergies  Allergen Reactions  . Codeine Rash    Patient Measurements: Height: 5\' 7"  (170.2 cm) Weight: 320 lb (145.151 kg) IBW/kg (Calculated) : 61.6   Vital Signs: Temp: 100.4 F (38 C) (06/07 0220) Temp src: Oral (06/07 0220) BP: 119/70 mmHg (06/07 0220) Pulse Rate: 130 (06/07 0220)  Labs:  Recent Labs  10/07/13 0527  HGB 11.2*  HCT 35.0*  PLT 398  CREATININE 0.64    Estimated Creatinine Clearance: 133.2 ml/min (by C-G formula based on Cr of 0.64).   Medical History: Past Medical History  Diagnosis Date  . Anemia     iron def  . Hypertension   . Morbid obesity   . GERD (gastroesophageal reflux disease)   . Anxiety   . Depression   . Migraine   . Back pain   . Chronic knee pain   . Arthritis     left knee   . Hx of laparoscopic gastric banding   . Anginal pain     Medications:  Scheduled:  . aspirin EC  325 mg Oral BID PC  . docusate sodium  100 mg Oral BID  . enoxaparin (LOVENOX) injection  1 mg/kg Subcutaneous BID  . lamoTRIgine  100 mg Oral QHS  . lisinopril  10 mg Oral q morning - 10a  . OxyCODONE  20 mg Oral Q12H  . pantoprazole  80 mg Oral Daily  . risperiDONE  1 mg Oral QHS   Infusions:  . sodium chloride 75 mL/hr at 10/08/13 0236    Assessment: 43 yo s/p L TKA 6/5, now with elevated HR and d-dimer.  Lovenox per Rx for elevated d-dimer. Goal of Therapy:  Anti-Xa level 0.6-1 units/ml 4hrs after LMWH dose given    Plan:   Lovenox 145mg  SQ q12h  F/U Scr/levels  If indicated  Dorrene German 10/08/2013,5:13 AM

## 2013-10-08 NOTE — Discharge Instructions (Signed)
Information on my medicine - XARELTO (rivaroxaban)  This medication education was reviewed with me or my healthcare representative as part of my discharge preparation.  The pharmacist that spoke with me during my hospital stay was:  Rudean Haskell, [place]? Xarelto was prescribed to treat blood clots that may have been found in the veins of your legs (deep vein thrombosis) or in your lungs (pulmonary embolism) and to reduce the risk of them occurring again.  What do you need to know about Xarelto? The starting dose is one 15 mg tablet taken TWICE daily with food for the FIRST 21 DAYS then on (enter date)  October 29, 2013 the dose is changed to one 20 mg tablet taken ONCE A DAY with your evening meal.  DO NOT stop taking Xarelto without talking to the health care provider who prescribed the medication.  Refill your prescription for 20 mg tablets before you run out.  After discharge, you should have regular check-up appointments with your healthcare provider that is prescribing your Xarelto.  In the future your dose may need to be changed if your kidney function changes by a significant amount.  What do you do if you miss a dose? If you are taking Xarelto TWICE DAILY and you miss a dose, take it as soon as you remember. You may take two 15 mg tablets (total 30 mg) at the same time then resume your regularly scheduled 15 mg twice daily the next day.  If you are taking Xarelto ONCE DAILY and you miss a dose, take it as soon as you remember on the same day then continue your regularly scheduled once daily regimen the next day. Do not take two doses of Xarelto at the same time.   Important Safety Information Xarelto is a blood thinner medicine that can cause bleeding. You should call your healthcare provider right away if you experience any of the following:   Bleeding from an injury or your nose that does not stop.   Unusual colored urine (red or dark brown)  or unusual colored stools (red or black).   Unusual bruising for unknown reasons.   A serious fall or if you hit your head (even if there is no bleeding).  Some medicines may interact with Xarelto and might increase your risk of bleeding while on Xarelto. To help avoid this, consult your healthcare provider or pharmacist prior to using any new prescription or non-prescription medications, including herbals, vitamins, non-steroidal anti-inflammatory drugs (NSAIDs) and supplements.  This website has more information on Xarelto: [email or web address].[email or web address]

## 2013-10-08 NOTE — Progress Notes (Signed)
PT Cancellation Note  _X_Treatment cancelled today due to medical issues with patient which prohibited therapy............... + B DVT and ro PE   ___ Treatment cancelled today due to patient receiving procedure or test   ___ Treatment cancelled today due to patient's refusal to participate   ___ Treatment cancelled today due to   Rica Koyanagi  PTA [facility]  Acute  Rehab Pager      [phone]

## 2013-10-08 NOTE — Progress Notes (Signed)
CARE MANAGEMENT NOTE 10/08/2013  Patient:  Crystal Garza, Crystal Garza   Account Number:  [id number]  Date Initiated:  10/07/2013  Documentation initiated by:  [facility]  Subjective/Objective Assessment:   LEFT TOTAL KNEE ARTHROPLASTY     Action/Plan:   [place]   Anticipated DC Date:  10/09/2013   Anticipated DC Plan:  [place]  CM consult      [facility] Choice  HOME HEALTH   Choice offered to / List presented to:  C-1 Patient   DME arranged  3-N-1  Vassie Moselle      DME agency  [place] arranged  HH-2 PT      [place]   Status of service:  Completed, signed off Medicare Important Message given?   (If response is "NO", the following Medicare IM given date fields will be blank) Date Medicare IM given:   Date Additional Medicare IM given:    Discharge Disposition:  [place]  Per UR Regulation:    If discussed at Long Length of Stay Meetings, dates discussed:    Comments:  10/08/2013 1225 NCM spoke to pt and states she uses [facility] for her medications. Provided pt with 30 day Xarelto free trial card. Explained to her she will need to activate card. Request sent to check benefits for meds and if prior auth needed. NCM will follow up with attending on 10/09/2013 with info on prior auth. Attempted call to [facility] to see if they have in stock. Pharmacy is closed. Jonnie Finner RN CCM Case Mgmt phone [phone]  10/07/2013 1420 NCM spoke to pt and offered choice for [facility]. Pt agreeable to [facility] for [facility]. Pt requested RW and 3n1 for home. Notified [facility] rep for DME for home. Has family at home to assist with her care. Jonnie Finner RN CCM Case Mgmt phone [phone]

## 2013-10-08 NOTE — Progress Notes (Signed)
ANTICOAGULATION CONSULT NOTE - Initial Consult  Pharmacy Consult for Xarelto Indication: New BLE DVTs  Allergies  Allergen Reactions  . Codeine Rash    Patient Measurements: Height: 5\' 7"  (170.2 cm) Weight: 320 lb (145.151 kg) IBW/kg (Calculated) : 61.6   Vital Signs: Temp: 99.7 F (37.6 C) (06/07 0500) Temp src: Oral (06/07 0500) BP: 104/67 mmHg (06/07 0500) Pulse Rate: 81 (06/07 0500)  Labs:  Recent Labs  10/07/13 0527 10/08/13 0308  HGB 11.2* 11.3*  HCT 35.0* 35.2*  PLT 398 331  CREATININE 0.64  --     Estimated Creatinine Clearance: 133.2 ml/min (by C-G formula based on Cr of 0.64).   Medical History: Past Medical History  Diagnosis Date  . Anemia     iron def  . Hypertension   . Morbid obesity   . GERD (gastroesophageal reflux disease)   . Anxiety   . Depression   . Migraine   . Back pain   . Chronic knee pain   . Arthritis     left knee   . Hx of laparoscopic gastric banding   . Anginal pain     Medications:  Scheduled:  . docusate sodium  100 mg Oral BID  . enoxaparin (LOVENOX) injection  1 mg/kg Subcutaneous BID  . lamoTRIgine  100 mg Oral QHS  . lisinopril  10 mg Oral q morning - 10a  . OxyCODONE  20 mg Oral Q12H  . pantoprazole  80 mg Oral Daily  . risperiDONE  1 mg Oral QHS   Infusions:  . sodium chloride 75 mL/hr at 10/08/13 0236   PRN: acetaminophen, acetaminophen, ALPRAZolam, alum & mag hydroxide-simeth, diphenhydrAMINE, HYDROmorphone (DILAUDID) injection, menthol-cetylpyridinium, methocarbamol (ROBAXIN) IV, methocarbamol, metoCLOPramide (REGLAN) injection, metoCLOPramide, ondansetron (ZOFRAN) IV, ondansetron, oxyCODONE, phenol, polyethylene glycol, sorbitol  Assessment:  43 yo morbidly obese F s/p TKR on 10/06/13.  Patient was started on aspirin 325 mg twice daily for VTE px s/p procedure, patient has subsequently developed Bilateral Lower Extremity DVTs post-operatively.  Patient initially ordered full dose Lovenox, plan to  transition to full dose Xarelto today.   Given patients good renal function,  likely fluctuating diet, and no current drug interactions with xarelto - feel Xarelto is better option for patient than warfarin.  Discussed with Dr.  Gelene Mink Renal: Scr is WNL 0.64, with CrCl > 100 ml/min  CBC: Hgb 11.3, Plt 331 PT/INR: Baseline PT/INR WNL  Goal of Therapy:  Xarelto dosing for New DVT/Per renal function  Monitor platelets by anticoagulation protocol: Yes   Plan:  1.) Discontinue full dose lovenox, Last dose given at 0532 on 6/7 2.) Start Xarelto 15 mg PO BID  with food for 21 days followed by 20 mg once daily with food, to start 12 hours after last lovenox dose 3.) Monitor CBC and renal function  4.) Pharmacy to educate patient on the use of Xarelto  For Discharge:  Patient will take Xarelto 15 mg PO BID from 6/7-6/27 and then transition to 20 mg daily on 6/28  Gaye Alken Elesia Pemberton PharmD Pager #: [phone] 10:20 AM 10/08/2013

## 2013-10-08 NOTE — Progress Notes (Signed)
Patient ID: Crystal Garza, female   DOB: Sep 02, 1970, 42 y.o.   MRN: [id number] D dimer positive,  Venous doppler positive for bilat DVT,  Angio CT pending, on Levenox bridge to Xarelto.   Patient stable vitals good pulse ox. .   If scan positive for PE will need 3 to 6 months treatment.

## 2013-10-08 NOTE — Progress Notes (Addendum)
Subjective: 2 Days Post-Op Procedure(s) (LRB): LEFT TOTAL KNEE ARTHROPLASTY (Left) Patient reports pain as moderate.    Objective: Vital signs in last 24 hours: Temp:  [98.1 F (36.7 C)-100.4 F (38 C)] 99.7 F (37.6 C) (06/07 0500) Pulse Rate:  [81-130] 81 (06/07 0500) Resp:  [16-20] 20 (06/07 0500) BP: (104-122)/(67-79) 104/67 mmHg (06/07 0500) SpO2:  [95 %-100 %] 96 % (06/07 0500)  Intake/Output from previous day: 06/06 0701 - 06/07 0700 In: 3148.8 [P.O.:1680; I.V.:1468.8] Out: 0349 [Urine:1830] Intake/Output this shift:     Recent Labs  10/07/13 0527 10/08/13 0308  HGB 11.2* 11.3*    Recent Labs  10/07/13 0527 10/08/13 0308  WBC 13.7* 16.3*  RBC 4.14 4.14  HCT 35.0* 35.2*  PLT 398 331    Recent Labs  10/07/13 0527  NA 139  K 4.1  CL 102  CO2 26  BUN 5*  CREATININE 0.64  GLUCOSE 114*  CALCIUM 8.9   No results found for this basename: LABPT, INR,  in the last 72 hours  Neurologically intact  Calf soft .  Pulse 120 on bedside commode.   CXR  Clear and EKG sinus tach.  Pulse was 81 on dynamap at 5 AM.    Assessment/Plan: 2 Days Post-Op Procedure(s) (LRB): LEFT TOTAL KNEE ARTHROPLASTY (Left) Up with therapy  Have ordered venous doppler for AM.  Placed on Lovenox.  O2 sats are good and she is hemodynamically stable.  Pulse was back to normal at 5 AM.    CT angio chest rule out  PE. Wells criteria 4.  Venous doppler ordered for AM will get Hospitalist to see pt and decide on CT angio.   D-dimer 2.93 Positive.   Marybelle Killings 10/08/2013, 8:31 AM

## 2013-10-08 NOTE — Consult Note (Signed)
Medical Consultation   Crystal Garza  OZH:[id number]  DOB: 1971-04-21  DOA: 10/06/2013  PCP: Melrose Nakayama, MD  Requesting physician: Dr. Lorin Mercy, orthopedics  Reason for consultation: Tachycardia   History of Present Illness: This 43 year old morbidly obese female with a history of hypertension, anemia, chronic knee pain who recently underwent left total knee replacement 2 days ago. TRH was consulted for tachycardia. Patient was also found to have an elevated d-dimer of 2.93. She continues to complain of lower extremity pain. Patient states her heart is racing, this has not happened to her before. She does admit to being sedentary before the procedure. She denies any shortness of breath or cough, chest pain, abdominal pain at this time.   Allergies:   Allergies  Allergen Reactions  . Codeine Rash      Past Medical History  Diagnosis Date  . Anemia     iron def  . Hypertension   . Morbid obesity   . GERD (gastroesophageal reflux disease)   . Anxiety   . Depression   . Migraine   . Back pain   . Chronic knee pain   . Arthritis     left knee   . Hx of laparoscopic gastric banding   . Anginal pain     Past Surgical History  Procedure Laterality Date  . Abdominal hysterectomy    . Knee arthroscopy    . Cesarean section  05/1991  . Breath tek h pylori  07/28/2011    Procedure: BREATH TEK H PYLORI;  Surgeon: [name], MD;  Location: [facility];  Service: General;  Laterality: N/A;  to be done at 745  . Esophageal biopsy  02/16/2012    Procedure: BIOPSY;  Surgeon: [name], MD;  Location: [facility];  Service: General;;  biopsy of mass x 2  . Eus  03/03/2012    Procedure: UPPER ENDOSCOPIC ULTRASOUND (EUS) LINEAR;  Surgeon: Milus Banister, MD;  Location: [facility];  Service: Endoscopy;  Laterality: N/A;  . Tubal ligation    . Laparoscopic gastrectomy  04/08/2012    Procedure: LAPAROSCOPIC GASTRECTOMY;  Surgeon: [name], MD;  Location: [facility];   Service: General;;  removal of GIST tumor of stomach  . Laparoscopic gastric sleeve resection N/A 08/29/2012    Procedure: LAPAROSCOPIC GASTRIC SLEEVE RESECTION;  Surgeon: [name], MD;  Location: [facility];  Service: General;  Laterality: N/A;  Sleeve Gastrectomy  . Esophagogastroduodenoscopy N/A 08/29/2012    Procedure: ESOPHAGOGASTRODUODENOSCOPY (EGD);  Surgeon: [name], MD;  Location: [facility];  Service: General;  Laterality: N/A;    Social History:  reports that she has never smoked. She has never used smokeless tobacco. She reports that she drinks alcohol. She reports that she does not use illicit drugs.  Family History  Problem Relation Age of Onset  . Diabetes Mother   . Cancer Father     brain tumor, colon  . Diabetes Father   . Heart disease Father   . Heart disease Paternal Uncle   . Diabetes Paternal Uncle   . Diabetes Maternal Grandmother   . Heart disease Paternal Grandmother   . Heart disease Paternal Grandfather   . Diabetes Maternal Aunt   . Diabetes Maternal Uncle   . Diabetes Paternal Aunt   . Diabetes Maternal Grandfather     Review of Systems:  Review of Systems:  Constitutional: Denies fever, chills, diaphoresis, appetite change and fatigue.  HEENT: Denies photophobia, eye pain, redness, hearing loss, ear  pain, congestion, sore throat, rhinorrhea, sneezing, mouth sores, trouble swallowing, neck pain, neck stiffness and tinnitus.   Respiratory: Denies SOB, DOE, cough, chest tightness,  and wheezing.   Cardiovascular: Complains of her heart racing. Gastrointestinal: Denies nausea, vomiting, abdominal pain, diarrhea, constipation, blood in stool and abdominal distention.  Genitourinary: Denies dysuria, urgency, frequency, hematuria, flank pain and difficulty urinating.  Musculoskeletal: Complains of left lower extremity pain. Skin: Denies pallor, rash and wound.  Neurological: Denies dizziness, seizures, syncope, weakness, light-headedness, numbness  and headaches.  Hematological: Denies adenopathy. Easy bruising, personal or family bleeding history  Psychiatric/Behavioral: Denies suicidal ideation, mood changes, confusion, nervousness, sleep disturbance and agitation   Physical Exam: Blood pressure 104/67, pulse 81, temperature 99.7 F (37.6 C), temperature source Oral, resp. rate 20, height 5\' 7"  (1.702 m), weight 145.151 kg (320 lb), SpO2 96.00%.   General: Well developed, well nourished, NAD, appears stated age  HEENT: NCAT, PERRLA, EOMI, Anicteic Sclera, mucous membranes moist.   Neck: Supple, no JVD, no masses  Cardiovascular: S1 S2 auscultated, no rubs, murmurs or gallops. tachycardic  Respiratory: Clear to auscultation bilaterally with equal chest rise  Abdomen: Soft, obese, nontender, nondistended, + bowel sounds  Extremities: warm dry without cyanosis clubbing or edema  Neuro: AAOx3, cranial nerves grossly intact.  Strength equal and bilateral in upper and lower extremities, however, limited in LLE due to recent surgery  Skin: Without rashes exudates or nodules  Psych: Normal affect and demeanor with intact judgement and insight  Labs on Admission:  Basic Metabolic Panel:  Recent Labs Lab 10/07/13 0527  NA 139  K 4.1  CL 102  CO2 26  GLUCOSE 114*  BUN 5*  CREATININE 0.64  CALCIUM 8.9   Liver Function Tests: No results found for this basename: AST, ALT, ALKPHOS, BILITOT, PROT, ALBUMIN,  in the last 168 hours No results found for this basename: LIPASE, AMYLASE,  in the last 168 hours No results found for this basename: AMMONIA,  in the last 168 hours CBC:  Recent Labs Lab 10/07/13 0527 10/08/13 0308  WBC 13.7* 16.3*  HGB 11.2* 11.3*  HCT 35.0* 35.2*  MCV 84.5 85.0  PLT 398 331   Cardiac Enzymes: No results found for this basename: CKTOTAL, CKMB, CKMBINDEX, TROPONINI,  in the last 168 hours BNP: No components found with this basename: POCBNP,  CBG: No results found for this basename:  GLUCAP,  in the last 168 hours  Inpatient Medications:   Scheduled Meds: . docusate sodium  100 mg Oral BID  . enoxaparin (LOVENOX) injection  1 mg/kg Subcutaneous BID  . lamoTRIgine  100 mg Oral QHS  . lisinopril  10 mg Oral q morning - 10a  . OxyCODONE  20 mg Oral Q12H  . pantoprazole  80 mg Oral Daily  . risperiDONE  1 mg Oral QHS   Continuous Infusions: . sodium chloride 75 mL/hr at 10/08/13 0236   Radiological Exams on Admission: Dg Chest Port 1 View  10/08/2013   CLINICAL DATA:  Hypertension and chest pain.  EXAM: PORTABLE CHEST - 1 VIEW  COMPARISON:  09/28/2013  FINDINGS: The heart size and mediastinal contours are within normal limits. Both lungs are clear. The visualized skeletal structures are unremarkable.  IMPRESSION: No acute cardiopulmonary findings.   Electronically Signed   By: Kalman Jewels M.D.   On: 10/08/2013 03:39   Dg Knee Left Port  10/06/2013   CLINICAL DATA:  Left knee replacement.  EXAM: PORTABLE LEFT KNEE - 1-2 VIEW  COMPARISON:  MRI left knee 04/07/2010.  FINDINGS: The patient has a new left total knee arthroplasty. Gas in the soft tissues and surgical staples are noted. The device is located. Hardware is intact. There is no fracture.  IMPRESSION: Left total knee replacement without evidence complication.   Electronically Signed   By: Inge Rise M.D.   On: 10/06/2013 18:02    Impression/Recommendations Arthritis of the left knee -Status post total knee replacement, postop day 2 -Being managed by primary team, orthopedics  Tachycardia -Given patient's recent surgery and immobility, patient does have a low score of 4 -Elevated d-dimer 2.93 -Pending lower extremity Dopplers as well as CT angiogram of the chest to rule out PE and DVT -Will also obtain TSH level  Normocytic Anemia -Stable, Baseline hemoglobin 11  Depression/ anxiety -Continue Risperdal, Lamictal  GERD -Continue PPI  Hypertension -Currently controlled continue  lisinopril  Morbid obesity -Patient will need to consult with her primary care physician regarding proper diet as well as possible exercise plan upon discharge.  I will followup again in the morning. Please contact me if I can be of assistance in the meanwhile. Thank you for this consultation.  Time Spent: 23 minutes  Handsome Anglin D.O. [facility] 10/08/2013, 9:00 AM

## 2013-10-08 NOTE — Progress Notes (Signed)
*  Preliminary Results* Bilateral lower extremity venous duplex completed. Bilateral lower extremities are positive for deep vein thrombosis involving bilateral posterior tibial veins and left peroneal veins. There is no evidence of Baker's cyst bilaterally.  Preliminary results discussed with Dr.Mikhail and Maudie Mercury, RN.  10/08/2013  Maudry Mayhew, RVT, RDCS, RDMS

## 2013-10-09 ENCOUNTER — Encounter (HOSPITAL_COMMUNITY): Payer: Self-pay | Admitting: Orthopaedic Surgery

## 2013-10-09 DIAGNOSIS — I2699 Other pulmonary embolism without acute cor pulmonale: Secondary | ICD-10-CM

## 2013-10-09 DIAGNOSIS — G8929 Other chronic pain: Secondary | ICD-10-CM

## 2013-10-09 LAB — CBC
HCT: 35.1 % — ABNORMAL LOW (ref 36.0–46.0)
Hemoglobin: 11.2 g/dL — ABNORMAL LOW (ref 12.0–15.0)
MCH: 27.4 pg (ref 26.0–34.0)
MCHC: 31.9 g/dL (ref 30.0–36.0)
MCV: 85.8 fL (ref 78.0–100.0)
Platelets: 373 10*3/uL (ref 150–400)
RBC: 4.09 MIL/uL (ref 3.87–5.11)
RDW: 14.4 % (ref 11.5–15.5)
WBC: 15.4 10*3/uL — ABNORMAL HIGH (ref 4.0–10.5)

## 2013-10-09 MED ORDER — TIZANIDINE HCL 4 MG PO TABS: 4.0000 mg | ORAL_TABLET | Freq: Four times a day (QID) | ORAL | Status: DC | PRN

## 2013-10-09 MED ORDER — OXYCODONE HCL 5 MG PO TABS: 5.0000 mg | ORAL_TABLET | ORAL | Status: DC | PRN

## 2013-10-09 MED ORDER — RIVAROXABAN 15 MG PO TABS: 15.0000 mg | ORAL_TABLET | Freq: Two times a day (BID) | ORAL | Status: DC

## 2013-10-09 NOTE — Progress Notes (Signed)
Pt to d/c home with [facility] home [facility]. DME provided at d/c. Pt medicated for pain before d/c. AVS reviewed and "My Chart" discussed with pt. Pt capable of verbalizing medications, dressing changes, signs and symptoms of infection, and follow-up appointments. Remains hemodynamically stable. No signs and symptoms of distress. Educated pt to return to ER in the case of SOB, dizziness, or chest pain.

## 2013-10-09 NOTE — Progress Notes (Signed)
Physical Therapy Treatment Patient Details Name: Crystal Garza MRN: [id number] DOB: [birth year] Today's Date: 10/09/2013    History of Present Illness 43 y/o obese female s/p L TKA.    PT Comments    POD # 3 amb pt limited distance in hallway then practiced going up/down 4 steps using one rail and one crutch.  Spouse present and educated on safe handling.  Performed all supine TKR TE's following handout HEP.  Instructed on freq and use of ICE after.  Follow Up Recommendations  Home health PT     Equipment Recommendations  Rolling walker with 5" wheels;3in1 (PT);Crutches    Recommendations for Other Services       Precautions / Restrictions Precautions Precautions: Knee Precaution Comments: pt able to perform active SLR so instructed pt D/C KI Restrictions Weight Bearing Restrictions: No LLE Weight Bearing: Weight bearing as tolerated    Mobility  Bed Mobility               General bed mobility comments: pt on BSC  Transfers Overall transfer level: Needs assistance Equipment used: Rolling walker (2 wheeled) Transfers: Sit to/from Stand Sit to Stand: Supervision;Min guard         General transfer comment: verbal cues for hand placement and safety plus increased time  Ambulation/Gait Ambulation/Gait assistance: Supervision;Min guard Ambulation Distance (Feet): 62 Feet Assistive device: Rolling walker (2 wheeled) Gait Pattern/deviations: Step-to pattern;Decreased stance time - left Gait velocity: decreased   General Gait Details: <25% VC's on proper walker to self distance and increased time   Stairs Stairs: Yes Stairs assistance: Min assist Stair Management: One rail Left;Step to pattern;Forwards;With crutches Number of Stairs: 4 General stair comments: with spouse using one rail and one crutch.  25% VC's on proper sequencing and increased time.  Pt tolerated stairs well enough to D/C to home today.  Wheelchair Mobility    Modified Rankin (Stroke  Patients Only)       Balance                                    Cognition Arousal/Alertness: Awake/alert (periods of lethargy/dozing) Behavior During Therapy: WFL for tasks assessed/performed Overall Cognitive Status: Within Functional Limits for tasks assessed                      Exercises      General Comments        Pertinent Vitals/Pain C/o 4/10 ICE applied    Home Living                      Prior Function            PT Goals (current goals can now be found in the care plan section) Progress towards PT goals: Progressing toward goals    Frequency  7X/week    PT Plan      Co-evaluation             End of Session   Activity Tolerance: Patient tolerated treatment well Patient left: in chair;with call bell/phone within reach;with family/visitor present     Time: 7371-0626 PT Time Calculation (min): 28 min  Charges:  $Gait Training: 8-22 mins $Therapeutic Exercise: 8-22 mins                    G Codes:      Rica Koyanagi  PTA [facility]  Acute  Rehab Pager      [phone]

## 2013-10-09 NOTE — Progress Notes (Signed)
10/09/2013 0945 NCM spoke to [facility] rep on 6/7 and they will deliver wide 3n1 to home. Pt has wide RW in room. Jonnie Finner RN CCM Case Mgmt phone [phone]

## 2013-10-09 NOTE — Progress Notes (Signed)
Occupational Therapy Treatment Patient Details Name: Crystal Garza MRN: [id number] DOB: [birth year] Today's Date: 10/09/2013    History of present illness 43 y/o obese female s/p L TKA.      Follow Up Recommendations  No OT follow up    Equipment Recommendations  None recommended by OT       Precautions / Restrictions Restrictions Weight Bearing Restrictions: No LLE Weight Bearing: Weight bearing as tolerated       Mobility Bed Mobility               General bed mobility comments: pt in chair  Transfers Overall transfer level: Needs assistance Equipment used: Rolling walker (2 wheeled) Transfers: Sit to/from Stand Sit to Stand: Min assist         General transfer comment: verbal cues for hand placement and safety        ADL Overall ADL's : Needs assistance/impaired             Lower Body Bathing: Minimal assistance;Sit to/from stand;Adhering to back precautions   Upper Body Dressing : Set up       Toilet Transfer: Min guard;Stand-pivot   Toileting- Clothing Manipulation and Hygiene: Minimal assistance;Sit to/from stand       Functional mobility during ADLs: Minimal assistance General ADL Comments: husband will A pt as needed. Education provided regarding AE and where to obtain                Cognition   Behavior During Therapy: [facility] for tasks assessed/performed Overall Cognitive Status: Within Functional Limits for tasks assessed                                 Progress Toward Goals  OT Goals(current goals can now be found in the care plan section)  Progress towards OT goals: Progressing toward goals     Plan Discharge plan remains appropriate       End of Session     Activity Tolerance Patient tolerated treatment well   Patient Left in chair;with call bell/phone within reach;with family/visitor present   Nurse Communication          Time: 1130-1200 OT Time Calculation (min): 30 min  Charges: OT  General Charges $OT Visit: 1 Procedure OT Treatments $Self Care/Home Management : 23-37 mins  Betsy Pries 10/09/2013, 12:14 PM

## 2013-10-09 NOTE — Consult Note (Signed)
[facility] PROGRESS NOTE  Crystal Garza VQQ:[id number] DOB: 03-Mar-1971 DOA: 10/06/2013 PCP: Melrose Nakayama, MD  Brief narrative: 43 year old morbidly obese female with a history of hypertension, anemia, chronic knee pain s/p left total knee replacement during this hospitalization. [facility] was consulted for tachycardia. Patient was also found to have an elevated d-dimer of 2.93. She was found to have pulmonary embolism and was started on xarelto.   Assessment/Plan:  Arthritis of the left knee   Pt is status post total knee replacement, postop day 3   Management per primary  Pt still has tachycardia but no c/o chest pain or palpitations. I think tachycardia is due to combination of pulmonary embolism and pain  She can be d/c home today; she can continue xarelto and I have informed the pt that in our clinic [facility] and wellness clinic) we have this medication as a sample we she can get if cost becomes an issue. Info on clinic number in follow up section Sinus atchycardia   Secondary to pulmonary embolism  Elevated d-dimer 2.93  Will obtain another 12 lead EKG this am  No c/o chest pain Normocytic Anemia   Stable, Baseline hemoglobin 11  Depression/ anxiety   Continue Risperdal, Lamictal  GERD   Continue PPI  Hypertension   Continue lisinopril   DVT prophylaxis:xarelto   Code Status: full code  Family Communication: plan of care discussed with the patient   Robbie Lis, MD  [facility] Pager [phone]  If 7PM-7AM, please contact night-coverage [email or web address] Password [facility] 10/09/2013, 7:38 AM   LOS: 3 days    HPI/Subjective: No acute overnight events.  Objective: Filed Vitals:   10/08/13 2131 10/09/13 0000 10/09/13 0412 10/09/13 0456  BP: 136/79   133/79  Pulse: 133 120  133  Temp: 97.8 F (36.6 C)   100 F (37.8 C)  TempSrc: Oral   Oral  Resp: 19  18 19   Height:      Weight:      SpO2: 95% 92%  91%    Intake/Output Summary  (Last 24 hours) at 10/09/13 0738 Last data filed at 10/08/13 2000  Gross per 24 hour  Intake   3000 ml  Output    500 ml  Net   2500 ml    Exam:   General:  Pt is alert, follows commands appropriately, not in acute distress  Cardiovascular: tachycardic, S1/S2, no murmurs  Respiratory: Clear to auscultation bilaterally, no wheezing, no crackles, no rhonchi  Abdomen: Soft, non tender, non distended, bowel sounds present  Extremities: No edema, pulses DP and PT palpable bilaterally  Neuro: Grossly nonfocal  Data Reviewed: Basic Metabolic Panel:  Recent Labs Lab 10/07/13 0527  NA 139  K 4.1  CL 102  CO2 26  GLUCOSE 114*  BUN 5*  CREATININE 0.64  CALCIUM 8.9   Liver Function Tests: No results found for this basename: AST, ALT, ALKPHOS, BILITOT, PROT, ALBUMIN,  in the last 168 hours No results found for this basename: LIPASE, AMYLASE,  in the last 168 hours No results found for this basename: AMMONIA,  in the last 168 hours CBC:  Recent Labs Lab 10/07/13 0527 10/08/13 0308 10/09/13 0430  WBC 13.7* 16.3* 15.4*  HGB 11.2* 11.3* 11.2*  HCT 35.0* 35.2* 35.1*  MCV 84.5 85.0 85.8  PLT 398 331 373   Cardiac Enzymes: No results found for this basename: CKTOTAL, CKMB, CKMBINDEX, TROPONINI,  in the last 168 hours BNP: No components found with this basename: POCBNP,  CBG: No results found for this basename: GLUCAP,  in the last 168 hours  No results found for this or any previous visit (from the past 240 hour(s)).   Studies: Ct Angio Chest Pe W/cm &/or Wo Cm 10/08/2013     IMPRESSION: Small burden pulmonary embolus over the posterior right lower lobar pulmonary arteries. No evidence of right heart strain.     Dg Chest Port 1 View 10/08/2013   IMPRESSION: No acute cardiopulmonary findings.      Scheduled Meds: . docusate sodium  100 mg Oral BID  . lamoTRIgine  100 mg Oral QHS  . lisinopril  10 mg Oral q morning - 10a  . pantoprazole  80 mg Oral Daily  .  risperiDONE  1 mg Oral QHS  . Rivaroxaban  15 mg Oral BID WC   Continuous Infusions: . sodium chloride 75 mL/hr at 10/08/13 0236

## 2013-10-09 NOTE — Discharge Summary (Signed)
Patient ID: Crystal Garza MRN: [id number] DOB/AGE: 1970-09-12 43 y.o.  Admit date: 10/06/2013 Discharge date: 10/09/2013  Admission Diagnoses:  Principal Problem:   Degenerative arthritis of left knee Active Problems:   Morbid obesity-BMI 62   DEPRESSION, MAJOR, RECURRENT   HYPERTENSION, BENIGN SYSTEMIC   GASTROESOPHAGEAL REFLUX, NO ESOPHAGITIS   Iron deficiency anemia   Anxiety   Bipolar disorder   Status post total knee replacement   DVT, bilateral lower limbs   Discharge Diagnoses:  S/P left TKA Post-op PE  Past Medical History  Diagnosis Date  . Anemia     iron def  . Hypertension   . Morbid obesity   . GERD (gastroesophageal reflux disease)   . Anxiety   . Depression   . Migraine   . Back pain   . Chronic knee pain   . Arthritis     left knee   . Hx of laparoscopic gastric banding   . Anginal pain     Surgeries: Procedure(s): LEFT TOTAL KNEE ARTHROPLASTY on 10/06/2013   Consultants:  internal medicine  Discharged Condition: Improved  Hospital Course: Crystal Garza is an 43 y.o. female who was admitted 10/06/2013 for operative treatment ofDegenerative arthritis of left knee. Patient has severe unremitting pain that affects sleep, daily activities, and work/hobbies. After pre-op clearance the patient was taken to the operating room on 10/06/2013 and underwent  Procedure(s): LEFT TOTAL KNEE ARTHROPLASTY.    Patient was given perioperative antibiotics: Anti-infectives   Start     Dose/Rate Route Frequency Ordered Stop   10/06/13 2200  ceFAZolin (ANCEF) IVPB 2 g/50 mL premix     2 g 100 mL/hr over 30 Minutes Intravenous Every 6 hours 10/06/13 1951 10/07/13 0530   10/06/13 0600  ceFAZolin (ANCEF) 3 g in dextrose 5 % 50 mL IVPB     3 g 160 mL/hr over 30 Minutes Intravenous On call to O.R. 10/05/13 1448 10/06/13 1450       Patient was given sequential compression devices, early ambulation, and chemoprophylaxis to prevent DVT.  Patient benefited maximally  from hospital stay.  Patient found to have Pulmonary Embolism POST-OP  DAY 2. Patient has been started on Xarelto.   Recent vital signs: Patient Vitals for the past 24 hrs:  BP Temp Temp src Pulse Resp SpO2  10/09/13 1137 - - - - 18 -  10/09/13 0747 - - - - 18 -  10/09/13 0456 133/79 mmHg 100 F (37.8 C) Oral 133 19 91 %  10/09/13 0412 - - - - 18 -  10/09/13 0000 - - - 120 - 92 %  10/08/13 2131 136/79 mmHg 97.8 F (36.6 C) Oral 133 19 95 %  10/08/13 2000 - - - 124 20 95 %  10/08/13 1456 101/66 mmHg - - 130 20 95 %  10/08/13 1400 - 98.9 F (37.2 C) - - - -     Recent laboratory studies:  Recent Labs  10/07/13 0527 10/08/13 0308 10/09/13 0430  WBC 13.7* 16.3* 15.4*  HGB 11.2* 11.3* 11.2*  HCT 35.0* 35.2* 35.1*  PLT 398 331 373  NA 139  --   --   K 4.1  --   --   CL 102  --   --   CO2 26  --   --   BUN 5*  --   --   CREATININE 0.64  --   --   GLUCOSE 114*  --   --   CALCIUM 8.9  --   --  Discharge Medications:     Medication List    STOP taking these medications       HYDROcodone-acetaminophen 7.5-325 MG per tablet  Commonly known as:  NORCO     ibuprofen 200 MG tablet  Commonly known as:  ADVIL,MOTRIN     oxyCODONE-acetaminophen 7.5-325 MG per tablet  Commonly known as:  PERCOCET      TAKE these medications       ALPRAZolam 1 MG tablet  Commonly known as:  XANAX  Take 1 mg by mouth 2 (two) times daily as needed for anxiety.     lamoTRIgine 100 MG tablet  Commonly known as:  LAMICTAL  Take 100 mg by mouth at bedtime.     lisinopril 10 MG tablet  Commonly known as:  PRINIVIL,ZESTRIL  Take 10 mg by mouth every morning.     omeprazole 40 MG capsule  Commonly known as:  PRILOSEC  Take 40 mg by mouth daily.     oxyCODONE 5 MG immediate release tablet  Commonly known as:  ROXICODONE  Take 1-3 tablets (5-15 mg total) by mouth every 4 (four) hours as needed for severe pain.     risperiDONE 1 MG tablet  Commonly known as:  RISPERDAL  Take 1 mg by  mouth at bedtime.     Rivaroxaban 15 MG Tabs tablet  Commonly known as:  XARELTO  Take 1 tablet (15 mg total) by mouth 2 (two) times daily with a meal.     tiZANidine 4 MG tablet  Commonly known as:  ZANAFLEX  Take 1 tablet (4 mg total) by mouth every 6 (six) hours as needed for muscle spasms.        Diagnostic Studies: Dg Chest 2 View  09/28/2013   CLINICAL DATA:  Preoperative examination (left total knee replacement), history of hypertension  EXAM: CHEST  2 VIEW  COMPARISON:  04/13/2012; 07/17/2011; chest CT - 04/14/2012  FINDINGS: Grossly unchanged cardiac silhouette and mediastinal contours. There is mild elevation of the right hemidiaphragm. No focal parenchymal opacities. No pleural effusion or pneumothorax. There is minimal pleural parenchymal thickening about the bilateral major fissures. No evidence of edema. No acute osseus abnormalities.  IMPRESSION: No acute cardiopulmonary disease.   Electronically Signed   By: Sandi Mariscal M.D.   On: 09/28/2013 10:51   Ct Angio Chest Pe W/cm &/or Wo Cm  10/08/2013   CLINICAL DATA:  Postop tachycardia. Increased D-dimer. Rule of pulmonary embolism. Post knee replacement surgery.  EXAM: CT ANGIOGRAPHY CHEST WITH CONTRAST  TECHNIQUE: Multidetector CT imaging of the chest was performed using the standard protocol during bolus administration of intravenous contrast. Multiplanar CT image reconstructions and MIPs were obtained to evaluate the vascular anatomy.  CONTRAST:  154mL OMNIPAQUE IOHEXOL 350 MG/ML SOLN  COMPARISON:  04/14/2012  FINDINGS: The lungs are clear. Heart is normal size. Examination demonstrates small burden pulmonary embolus over the posterior right lower lobar pulmonary arteries. RV/LV ratio is 1.0. Remaining mediastinal structures are within normal.  Images through the upper abdomen demonstrate a surgical suture line over the stomach. There mild degenerate changes of the spine.  Review of the MIP images confirms the above findings.  Critical  Value/emergent results were called by telephone at the time of interpretation on 10/08/2013 at 11:44 AM to Dr. Rodell Perna , who verbally acknowledged these results.  IMPRESSION: Small burden pulmonary embolus over the posterior right lower lobar pulmonary arteries. No evidence of right heart strain.   Electronically Signed   By: Quillian Quince  Derrel Nip M.D.   On: 10/08/2013 11:44   Dg Chest Port 1 View  10/08/2013   CLINICAL DATA:  Hypertension and chest pain.  EXAM: PORTABLE CHEST - 1 VIEW  COMPARISON:  09/28/2013  FINDINGS: The heart size and mediastinal contours are within normal limits. Both lungs are clear. The visualized skeletal structures are unremarkable.  IMPRESSION: No acute cardiopulmonary findings.   Electronically Signed   By: Kalman Jewels M.D.   On: 10/08/2013 03:39   Dg Knee Left Port  10/06/2013   CLINICAL DATA:  Left knee replacement.  EXAM: PORTABLE LEFT KNEE - 1-2 VIEW  COMPARISON:  MRI left knee 04/07/2010.  FINDINGS: The patient has a new left total knee arthroplasty. Gas in the soft tissues and surgical staples are noted. The device is located. Hardware is intact. There is no fracture.  IMPRESSION: Left total knee replacement without evidence complication.   Electronically Signed   By: Inge Rise M.D.   On: 10/06/2013 18:02    Disposition: 01-Home or Self Care      Discharge Instructions   Discharge wound care:    Complete by:  As directed   Keep dressing clean and intact. May shower with dressing intact . Remove dressing Friday and shower. After showering apply clean dressing.     Weight bearing as tolerated    Complete by:  As directed            Follow-up Information   Follow up with [facility])    Contact information:   [address] [place] [address] [phone]       Follow up with [facility]    . (in case pt requires PCP and refill on xarelto)    Contact information:   [place] [id number] [phone]       Signed: Erskine Emery 10/09/2013, 12:20 PM

## 2013-10-09 NOTE — Progress Notes (Signed)
Patient ID: Crystal Garza, female   DOB: 02-24-1971, 43 y.o.   MRN: [id number] Appreciate [facility] consults.  Now on Xarelto BID  Due to bilateral DVT and small PE.  Still with tachycardia, but denies chest pain or SOB.  Left knee stable.  Will discharge to home with home health PT possibly today, if cleared by [facility] to go home.

## 2013-10-11 ENCOUNTER — Telehealth: Payer: Self-pay | Admitting: Family Medicine

## 2013-10-11 NOTE — Telephone Encounter (Signed)
Pt called and needs a refill on her pain medication left up front for pick up. Please call when ready. jw

## 2013-10-12 NOTE — Telephone Encounter (Signed)
According to Epic she was prescribed #90 on 10/09/13 by Dr. Ninfa Linden when she had her knee replacement. That is enough to last until July.  Thank you, Marina Boerner M. Bresha Hosack, M.D.

## 2013-10-12 NOTE — Telephone Encounter (Signed)
No answer and no machine,  Will await callback from patient. Hubert Raatz, Salome Spotted

## 2013-10-30 ENCOUNTER — Ambulatory Visit: Payer: Medicare Other | Admitting: Physical Therapy

## 2013-11-01 ENCOUNTER — Ambulatory Visit: Payer: Medicare Other | Attending: Orthopaedic Surgery | Admitting: Physical Therapy

## 2013-11-01 DIAGNOSIS — R030 Elevated blood-pressure reading, without diagnosis of hypertension: Secondary | ICD-10-CM | POA: Insufficient documentation

## 2013-11-01 DIAGNOSIS — M25569 Pain in unspecified knee: Secondary | ICD-10-CM | POA: Insufficient documentation

## 2013-11-01 DIAGNOSIS — R5381 Other malaise: Secondary | ICD-10-CM | POA: Insufficient documentation

## 2013-11-01 DIAGNOSIS — Z5333 Arthroscopic surgical procedure converted to open procedure: Secondary | ICD-10-CM | POA: Diagnosis not present

## 2013-11-01 DIAGNOSIS — IMO0001 Reserved for inherently not codable concepts without codable children: Secondary | ICD-10-CM | POA: Insufficient documentation

## 2013-11-01 DIAGNOSIS — M25669 Stiffness of unspecified knee, not elsewhere classified: Secondary | ICD-10-CM | POA: Insufficient documentation

## 2013-11-02 ENCOUNTER — Telehealth: Payer: Self-pay | Admitting: Family Medicine

## 2013-11-02 NOTE — Telephone Encounter (Signed)
Patient states she filled out form to change her PCP to Dr. Nori Riis back in March to be changed once Dr. Sheral Apley graduated. Her mother and sister both see Dr. Nori Riis. PCP was not changed to Dr. Nori Riis but to Dr. Caryl Bis. Please advise.

## 2013-11-06 ENCOUNTER — Ambulatory Visit (INDEPENDENT_AMBULATORY_CARE_PROVIDER_SITE_OTHER): Payer: Medicare Other | Admitting: *Deleted

## 2013-11-06 DIAGNOSIS — Z111 Encounter for screening for respiratory tuberculosis: Secondary | ICD-10-CM

## 2013-11-06 NOTE — Telephone Encounter (Signed)
Patient's mother Sandi Mealy) and sister (Tomisha Allred) are Dr. Verlon Au patients.  Discussed with Dr. Rosemary Holms to change PCP to Dr. Nori Riis.  Called and left detailed message on patient's voicemail.  Burna Forts, BSN, RN-BC

## 2013-11-08 ENCOUNTER — Ambulatory Visit (INDEPENDENT_AMBULATORY_CARE_PROVIDER_SITE_OTHER): Payer: Medicare Other | Admitting: Family Medicine

## 2013-11-08 ENCOUNTER — Ambulatory Visit: Payer: Medicare Other | Admitting: Physical Therapy

## 2013-11-08 VITALS — BP 139/60 | HR 86 | Temp 98.2°F | Wt 306.3 lb

## 2013-11-08 DIAGNOSIS — IMO0001 Reserved for inherently not codable concepts without codable children: Secondary | ICD-10-CM | POA: Diagnosis not present

## 2013-11-08 DIAGNOSIS — M1712 Unilateral primary osteoarthritis, left knee: Secondary | ICD-10-CM

## 2013-11-08 DIAGNOSIS — Z96659 Presence of unspecified artificial knee joint: Secondary | ICD-10-CM

## 2013-11-08 DIAGNOSIS — Z96652 Presence of left artificial knee joint: Secondary | ICD-10-CM

## 2013-11-08 DIAGNOSIS — M1710 Unilateral primary osteoarthritis, unspecified knee: Secondary | ICD-10-CM

## 2013-11-08 DIAGNOSIS — I82403 Acute embolism and thrombosis of unspecified deep veins of lower extremity, bilateral: Secondary | ICD-10-CM

## 2013-11-08 DIAGNOSIS — I82409 Acute embolism and thrombosis of unspecified deep veins of unspecified lower extremity: Secondary | ICD-10-CM

## 2013-11-08 DIAGNOSIS — D490 Neoplasm of unspecified behavior of digestive system: Secondary | ICD-10-CM

## 2013-11-08 MED ORDER — OXYCODONE-ACETAMINOPHEN 7.5-325 MG PO TABS: 1.0000 | ORAL_TABLET | Freq: Four times a day (QID) | ORAL | Status: DC | PRN

## 2013-11-08 MED ORDER — RIVAROXABAN 20 MG PO TABS: 20.0000 mg | ORAL_TABLET | Freq: Every day | ORAL | Status: DC

## 2013-11-10 NOTE — Assessment & Plan Note (Signed)
Today we'll move her to the 20 mg daily Zarrella toe. Discussed need for this at least 6 months and she agrees.

## 2013-11-10 NOTE — Assessment & Plan Note (Addendum)
GI ST tumor  Her report she's not had any specific followup for this. I'll have to get the rest of her records and see if there is any specific followup we need to be doing as she is now a new patient to my practice. I'll see her back in one month we'll discuss this again

## 2013-11-10 NOTE — Progress Notes (Signed)
   Subjective:    Patient ID: Dallas Schimke, female    DOB: January 21, 1971, 43 y.o.   MRN: [id number]  HPI To establish care with me as her previous PCP has left his practice. Several issues. #1. Status post left TKR last month. Central regaining full flexion of her knee and they're contemplating doing second surgery #2. Diagnosed with bilateral DVT. Has questions about that #3. Has lost some weight since her last surgery. 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 She reports no specific followup for that since then. She reports no stomach pain.   Review of Systems No unusual weight change, no fever, sweats, chills. No, pain. She does continue to have left knee pain but no pain in either calf.    Objective:   Physical Exam  Signs are reviewed GENERAL: Well-developed obese female no acute distress in KNEE: Left. Healing scar anterior. She has flexion to about 70. The calf is soft. ABDOMEN: Soft, obese, nontender nondistended CV: Regular rate and rhythm no murmur LUNGS: Clear to auscultation bilaterally      Assessment & Plan:

## 2013-11-10 NOTE — Assessment & Plan Note (Signed)
She's having trouble getting full flexion back. She sees the orthopedist again next week. They're contemplating whether or not they need to go back in and breakups and adhesions. She does really want to do this so she is trying very hard to work at home on physical therapy and get her flexion range of motion back.

## 2013-11-10 NOTE — Assessment & Plan Note (Signed)
She is status post TKR last month. I will give her some pain medicine and she continues to try to regain her full flexion. Ultimately I want to taper totally off narcotics.

## 2013-11-13 ENCOUNTER — Ambulatory Visit: Payer: Medicare Other | Admitting: Physical Therapy

## 2013-11-13 DIAGNOSIS — IMO0001 Reserved for inherently not codable concepts without codable children: Secondary | ICD-10-CM | POA: Diagnosis not present

## 2013-11-15 ENCOUNTER — Ambulatory Visit: Payer: Medicare Other | Admitting: Physical Therapy

## 2013-11-21 ENCOUNTER — Ambulatory Visit: Payer: Medicare Other | Admitting: Physical Therapy

## 2013-12-06 ENCOUNTER — Ambulatory Visit (INDEPENDENT_AMBULATORY_CARE_PROVIDER_SITE_OTHER): Payer: Medicare Other | Admitting: Family Medicine

## 2013-12-06 ENCOUNTER — Encounter: Payer: Self-pay | Admitting: Family Medicine

## 2013-12-06 VITALS — BP 130/75 | HR 80 | Temp 98.0°F | Ht 67.0 in | Wt 303.3 lb

## 2013-12-06 DIAGNOSIS — D481 Neoplasm of uncertain behavior of connective and other soft tissue: Secondary | ICD-10-CM

## 2013-12-06 DIAGNOSIS — I82409 Acute embolism and thrombosis of unspecified deep veins of unspecified lower extremity: Secondary | ICD-10-CM

## 2013-12-06 DIAGNOSIS — M1710 Unilateral primary osteoarthritis, unspecified knee: Secondary | ICD-10-CM

## 2013-12-06 DIAGNOSIS — C49A2 Gastrointestinal stromal tumor of stomach: Secondary | ICD-10-CM

## 2013-12-06 DIAGNOSIS — I82403 Acute embolism and thrombosis of unspecified deep veins of lower extremity, bilateral: Secondary | ICD-10-CM

## 2013-12-06 DIAGNOSIS — M1712 Unilateral primary osteoarthritis, left knee: Secondary | ICD-10-CM

## 2013-12-06 MED ORDER — OXYCODONE-ACETAMINOPHEN 7.5-325 MG PO TABS: 1.0000 | ORAL_TABLET | Freq: Four times a day (QID) | ORAL | Status: DC | PRN

## 2013-12-08 NOTE — Assessment & Plan Note (Signed)
She's had a TKR. I give her another month's worth of pain medicine. We talked extensively about how important it is for her give her full flexion. I reviewed heel slides and other flexion exercises. I'll see her back in a month.

## 2013-12-08 NOTE — Assessment & Plan Note (Addendum)
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 There doesn't seem to be any specific followup recommendations the certainly should she have any type of abdominal pain her unusual symptoms I would be a little quicker to do more intensive workup given her history.

## 2013-12-08 NOTE — Assessment & Plan Note (Signed)
Continue xarelto

## 2013-12-08 NOTE — Progress Notes (Signed)
   Subjective:    Patient ID: Crystal Garza, female    DOB: 03/18/1971, 43 y.o.   MRN: [id number]  HPI #1. FollowupGIST tumor. She's had no problems. No abdominal pain. She was asymptomatic for the resection however in they found incidentally during her workup for weight reduction surgery. She has questions about followup.  #2. Left knee pain. She has followup in 2 weeks with her orthopedist. Brita Romp been trying to get her flexion to 90 because of she does not pain is he is contemplating another surgical procedure. She's having a lot of pain but really is trying to force herself to do the exercises at home as her insurance is not going to cover any more outpatient physical therapy. #3. DVT. She continues on Xarelto without problem. No episodes of bleeding.   Review of Systems No fever, sweats, chills. No heartburn. No change in her stool pattern. No abdominal pain or distention. Continues to have left knee pain and stiffness that is 6-7/10 even with the use of her pain medicine.    Objective:   Physical Exam  Vital signs are reviewed GENERAL: Well-developed overweight female no acute distress KNEE: Left. Well-healed midline vertical scar. She has full extension She has flexion to about 80. Calf is soft. 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

## 2013-12-11 ENCOUNTER — Encounter: Payer: Self-pay | Admitting: *Deleted

## 2013-12-11 NOTE — Progress Notes (Signed)
Prior Authorization received from [facility] for Xarelto 20 mg tablet.  PA form placed in provider box for completion. Derl Barrow, RN

## 2013-12-12 NOTE — Progress Notes (Signed)
PA form faxed to [employer] for review.  Derl Barrow, RN   PA approved from [employer] until 12/13/2014 under Medicare Part D benefit.  [place] aware of approval.  Derl Barrow, RN

## 2013-12-12 NOTE — Progress Notes (Signed)
Patient ID: Crystal Garza, female   DOB: Jul 16, 1970, 43 y.o.   MRN: [id number] Form completed

## 2014-01-03 ENCOUNTER — Ambulatory Visit (INDEPENDENT_AMBULATORY_CARE_PROVIDER_SITE_OTHER): Payer: Medicare Other | Admitting: Family Medicine

## 2014-01-03 ENCOUNTER — Encounter: Payer: Self-pay | Admitting: Family Medicine

## 2014-01-03 VITALS — BP 133/58 | HR 79 | Temp 97.5°F | Ht 67.0 in | Wt 305.6 lb

## 2014-01-03 DIAGNOSIS — Z23 Encounter for immunization: Secondary | ICD-10-CM

## 2014-01-03 DIAGNOSIS — I82409 Acute embolism and thrombosis of unspecified deep veins of unspecified lower extremity: Secondary | ICD-10-CM

## 2014-01-03 DIAGNOSIS — I82403 Acute embolism and thrombosis of unspecified deep veins of lower extremity, bilateral: Secondary | ICD-10-CM

## 2014-01-03 DIAGNOSIS — Z96659 Presence of unspecified artificial knee joint: Secondary | ICD-10-CM

## 2014-01-03 DIAGNOSIS — Z96652 Presence of left artificial knee joint: Secondary | ICD-10-CM

## 2014-01-03 MED ORDER — OXYCODONE-ACETAMINOPHEN 7.5-325 MG PO TABS: 1.0000 | ORAL_TABLET | Freq: Four times a day (QID) | ORAL | Status: DC | PRN

## 2014-01-04 ENCOUNTER — Telehealth: Payer: Self-pay | Admitting: Family Medicine

## 2014-01-04 NOTE — Assessment & Plan Note (Signed)
She's doing much better with her rehabilitation. 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 She seems somewhat relieved by this idea. I'll continue to give her pain medicines as I think she is still having a lot of pain with her rehabilitation and she very much needs to complete her rehabilitation to get her full flexion back. I do not think he would be on long-term chronic narcotic therapy. I will see her back in one month

## 2014-01-04 NOTE — Telephone Encounter (Signed)
Spoke with patient and informed her that i sat a previous letter written up there

## 2014-01-04 NOTE — Telephone Encounter (Signed)
Pt called and would like a copy of her PPD print out and left up front for pick up. Please call when ready. jw

## 2014-01-04 NOTE — Progress Notes (Signed)
   Subjective:    Patient ID: Crystal Garza, female    DOB: 1970/11/20, 43 y.o.   MRN: [id number]  HPI Followup left knee pain. 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 She was able to get to 90 of flexion and now her goal for the next 6 weeks is to get 120 of flexion. She is very optimistic. She says she's working very hard. She does continue to have a lot of knee pain. 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   Review of Systems No fever, sweats, chills, unusual weight change.    Objective:   Physical Exam  Vital signs are reviewed GENERAL: Well-developed overweight female no acute distress in KNEE: Left. Her anterior vertical knee scar is showing some signs of hypertrophying keloid formation but is otherwise intact. She has about 95 of flexion. She has full extension. The calf is soft. Distally she is neurovascularly intact.     Assessment & Plan:

## 2014-01-04 NOTE — Assessment & Plan Note (Signed)
She continues on xarelto without problem and we discussed again how important it is to maintain this medicine to

## 2014-01-16 ENCOUNTER — Emergency Department (HOSPITAL_COMMUNITY): Payer: Medicare Other

## 2014-01-16 ENCOUNTER — Emergency Department (HOSPITAL_COMMUNITY)
Admission: EM | Admit: 2014-01-16 | Discharge: 2014-01-16 | Disposition: A | Discharge status: Home / Self Care | Payer: Medicare Other | Attending: Emergency Medicine | Admitting: Emergency Medicine

## 2014-01-16 ENCOUNTER — Encounter (HOSPITAL_COMMUNITY): Payer: Self-pay | Admitting: Emergency Medicine

## 2014-01-16 DIAGNOSIS — R109 Unspecified abdominal pain: Secondary | ICD-10-CM | POA: Insufficient documentation

## 2014-01-16 DIAGNOSIS — F329 Major depressive disorder, single episode, unspecified: Secondary | ICD-10-CM | POA: Insufficient documentation

## 2014-01-16 DIAGNOSIS — Z86718 Personal history of other venous thrombosis and embolism: Secondary | ICD-10-CM | POA: Insufficient documentation

## 2014-01-16 DIAGNOSIS — Z7901 Long term (current) use of anticoagulants: Secondary | ICD-10-CM | POA: Insufficient documentation

## 2014-01-16 DIAGNOSIS — Z87442 Personal history of urinary calculi: Secondary | ICD-10-CM | POA: Insufficient documentation

## 2014-01-16 DIAGNOSIS — Z86711 Personal history of pulmonary embolism: Secondary | ICD-10-CM | POA: Diagnosis not present

## 2014-01-16 DIAGNOSIS — F411 Generalized anxiety disorder: Secondary | ICD-10-CM | POA: Diagnosis not present

## 2014-01-16 DIAGNOSIS — I10 Essential (primary) hypertension: Secondary | ICD-10-CM | POA: Insufficient documentation

## 2014-01-16 DIAGNOSIS — K219 Gastro-esophageal reflux disease without esophagitis: Secondary | ICD-10-CM | POA: Insufficient documentation

## 2014-01-16 DIAGNOSIS — Z79899 Other long term (current) drug therapy: Secondary | ICD-10-CM | POA: Diagnosis not present

## 2014-01-16 DIAGNOSIS — G8929 Other chronic pain: Secondary | ICD-10-CM | POA: Insufficient documentation

## 2014-01-16 DIAGNOSIS — M1710 Unilateral primary osteoarthritis, unspecified knee: Secondary | ICD-10-CM | POA: Insufficient documentation

## 2014-01-16 DIAGNOSIS — G43909 Migraine, unspecified, not intractable, without status migrainosus: Secondary | ICD-10-CM | POA: Diagnosis not present

## 2014-01-16 DIAGNOSIS — IMO0002 Reserved for concepts with insufficient information to code with codable children: Secondary | ICD-10-CM | POA: Diagnosis not present

## 2014-01-16 DIAGNOSIS — F3289 Other specified depressive episodes: Secondary | ICD-10-CM | POA: Diagnosis not present

## 2014-01-16 HISTORY — DX: Other pulmonary embolism without acute cor pulmonale: I26.99

## 2014-01-16 HISTORY — DX: Acute embolism and thrombosis of unspecified deep veins of unspecified lower extremity: I82.409

## 2014-01-16 LAB — URINALYSIS, ROUTINE W REFLEX MICROSCOPIC
Bilirubin Urine: NEGATIVE
Glucose, UA: NEGATIVE mg/dL
Ketones, ur: NEGATIVE mg/dL
Leukocytes, UA: NEGATIVE
Nitrite: NEGATIVE
Protein, ur: NEGATIVE mg/dL
Specific Gravity, Urine: 1.023 (ref 1.005–1.030)
Urobilinogen, UA: 1 mg/dL (ref 0.0–1.0)
pH: 6 (ref 5.0–8.0)

## 2014-01-16 LAB — URINE MICROSCOPIC-ADD ON

## 2014-01-16 MED ORDER — HYDROMORPHONE HCL PF 1 MG/ML IJ SOLN
2.0000 mg | Freq: Once | INTRAMUSCULAR | Status: AC
  Administered 2014-01-16: 2 mg via INTRAMUSCULAR
  Filled 2014-01-16 (×2): qty 2

## 2014-01-16 MED ORDER — OXYCODONE-ACETAMINOPHEN 5-325 MG PO TABS: 2.0000 | ORAL_TABLET | ORAL | Status: DC | PRN

## 2014-01-16 MED ORDER — ONDANSETRON 8 MG PO TBDP
8.0000 mg | ORAL_TABLET | Freq: Once | ORAL | Status: AC
  Administered 2014-01-16: 8 mg via ORAL
  Filled 2014-01-16: qty 1

## 2014-01-16 MED ORDER — CEPHALEXIN 500 MG PO CAPS: 500.0000 mg | ORAL_CAPSULE | Freq: Four times a day (QID) | ORAL | Status: DC

## 2014-01-16 NOTE — ED Notes (Signed)
Pt is c/o right flank pain that started yesterday morning  Pt states the pain is in her lower back on the right and radiating around to the front  Pt has hx of kidney stones  Pt has nausea without vomiting  Voiding without difficulty

## 2014-01-16 NOTE — Discharge Instructions (Signed)
Flank Pain °Flank pain refers to pain that is located on the side of the body between the upper abdomen and the back. The pain may occur over a short period of time (acute) or may be long-term or reoccurring (chronic). It may be mild or severe. Flank pain can be caused by many things. °CAUSES  °Some of the more common causes of flank pain include: °· Muscle strains.   °· Muscle spasms.   °· A disease of your spine (vertebral disk disease).   °· A lung infection (pneumonia).   °· Fluid around your lungs (pulmonary edema).   °· A kidney infection.   °· Kidney stones.   °· A very painful skin rash caused by the chickenpox virus (shingles).   °· Gallbladder disease.   °HOME CARE INSTRUCTIONS  °Home care will depend on the cause of your pain. In general, °· Rest as directed by your caregiver. °· Drink enough fluids to keep your urine clear or pale yellow. °· Only take over-the-counter or prescription medicines as directed by your caregiver. Some medicines may help relieve the pain. °· Tell your caregiver about any changes in your pain. °· Follow up with your caregiver as directed. °SEEK IMMEDIATE MEDICAL CARE IF:  °· Your pain is not controlled with medicine.   °· You have new or worsening symptoms. °· Your pain increases.   °· You have abdominal pain.   °· You have shortness of breath.   °· You have persistent nausea or vomiting.   °· You have swelling in your abdomen.   °· You feel faint or pass out.   °· You have blood in your urine. °· You have a fever or persistent symptoms for more than 2-3 days. °· You have a fever and your symptoms suddenly get worse. °MAKE SURE YOU:  °· Understand these instructions. °· Will watch your condition. °· Will get help right away if you are not doing well or get worse. °Document Released: 06/11/2005 Document Revised: 01/13/2012 Document Reviewed: 12/03/2011 °ExitCare® Patient Information ©2015 [employer]. This information is not intended to replace advice given to you by your  health care provider. Make sure you discuss any questions you have with your health care provider. ° °Urinary Tract Infection °Urinary tract infections (UTIs) can develop anywhere along your urinary tract. Your urinary tract is your body's drainage system for removing wastes and extra water. Your urinary tract includes two kidneys, two ureters, a bladder, and a urethra. Your kidneys are a pair of bean-shaped organs. Each kidney is about the size of your fist. They are located below your ribs, one on each side of your spine. °CAUSES °Infections are caused by microbes, which are microscopic organisms, including fungi, viruses, and bacteria. These organisms are so small that they can only be seen through a microscope. Bacteria are the microbes that most commonly cause UTIs. °SYMPTOMS  °Symptoms of UTIs may vary by age and gender of the patient and by the location of the infection. Symptoms in young women typically include a frequent and intense urge to urinate and a painful, burning feeling in the bladder or urethra during urination. Older women and men are more likely to be tired, shaky, and weak and have muscle aches and abdominal pain. A fever may mean the infection is in your kidneys. Other symptoms of a kidney infection include pain in your back or sides below the ribs, nausea, and vomiting. °DIAGNOSIS °To diagnose a UTI, your caregiver will ask you about your symptoms. Your caregiver also will ask to provide a urine sample. The urine sample will be tested for   bacteria and white blood cells. White blood cells are made by your body to help fight infection. °TREATMENT  °Typically, UTIs can be treated with medication. Because most UTIs are caused by a bacterial infection, they usually can be treated with the use of antibiotics. The choice of antibiotic and length of treatment depend on your symptoms and the type of bacteria causing your infection. °HOME CARE INSTRUCTIONS °· If you were prescribed antibiotics, take  them exactly as your caregiver instructs you. Finish the medication even if you feel better after you have only taken some of the medication. °· Drink enough water and fluids to keep your urine clear or pale yellow. °· Avoid caffeine, tea, and carbonated beverages. They tend to irritate your bladder. °· Empty your bladder often. Avoid holding urine for long periods of time. °· Empty your bladder before and after sexual intercourse. °· After a bowel movement, women should cleanse from front to back. Use each tissue only once. °SEEK MEDICAL CARE IF:  °· You have back pain. °· You develop a fever. °· Your symptoms do not begin to resolve within 3 days. °SEEK IMMEDIATE MEDICAL CARE IF:  °· You have severe back pain or lower abdominal pain. °· You develop chills. °· You have nausea or vomiting. °· You have continued burning or discomfort with urination. °MAKE SURE YOU:  °· Understand these instructions. °· Will watch your condition. °· Will get help right away if you are not doing well or get worse. °Document Released: 01/28/2005 Document Revised: 10/20/2011 Document Reviewed: 05/29/2011 °ExitCare® Patient Information ©2015 [employer]. This information is not intended to replace advice given to you by your health care provider. Make sure you discuss any questions you have with your health care provider. ° °

## 2014-01-16 NOTE — ED Notes (Signed)
Patient transported to CT 

## 2014-01-16 NOTE — ED Provider Notes (Signed)
CSN: [id number]     Arrival date & time 01/16/14  0235 History   First MD Initiated Contact with Patient 01/16/14 [phone]     Chief Complaint  Patient presents with  . Flank Pain     (Consider location/radiation/quality/duration/timing/severity/associated sxs/prior Treatment) HPI Comments: Patient here complaining of colicky right-sided flank pain which began yesterday morning. History of kidney stones and this is similar. Denies dysuria or hematuria. No rashes to the skin noted. No fever or chills. Nothing makes her symptoms better or worse. No treatment used prior to arrival. She denies any vaginal bleeding or discharge. Denies any dyspnea. She is unsure when her last kidney stone once.  Patient is a 43 y.o. female presenting with flank pain. The history is provided by the patient.  Flank Pain    Past Medical History  Diagnosis Date  . Anemia     iron def  . Hypertension   . Morbid obesity   . GERD (gastroesophageal reflux disease)   . Anxiety   . Depression   . Migraine   . Back pain   . Chronic knee pain   . Arthritis     left knee   . Hx of laparoscopic gastric banding   . Anginal pain   . Kidney stones   . DVT (deep venous thrombosis)   . PE (pulmonary embolism)    Past Surgical History  Procedure Laterality Date  . Abdominal hysterectomy    . Knee arthroscopy    . Cesarean section  05/1991  . Breath tek h pylori  07/28/2011    Procedure: BREATH TEK H PYLORI;  Surgeon: [name], MD;  Location: [facility];  Service: General;  Laterality: N/A;  to be done at 745  . Esophageal biopsy  02/16/2012    Procedure: BIOPSY;  Surgeon: [name], MD;  Location: [facility];  Service: General;;  biopsy of mass x 2  . Eus  03/03/2012    Procedure: UPPER ENDOSCOPIC ULTRASOUND (EUS) LINEAR;  Surgeon: Milus Banister, MD;  Location: [facility];  Service: Endoscopy;  Laterality: N/A;  . Tubal ligation    . Laparoscopic gastrectomy  04/08/2012    Procedure: LAPAROSCOPIC  GASTRECTOMY;  Surgeon: [name], MD;  Location: [facility];  Service: General;;  removal of GIST tumor of stomach  . Laparoscopic gastric sleeve resection N/A 08/29/2012    Procedure: LAPAROSCOPIC GASTRIC SLEEVE RESECTION;  Surgeon: [name], MD;  Location: [facility];  Service: General;  Laterality: N/A;  Sleeve Gastrectomy  . Esophagogastroduodenoscopy N/A 08/29/2012    Procedure: ESOPHAGOGASTRODUODENOSCOPY (EGD);  Surgeon: [name], MD;  Location: [facility];  Service: General;  Laterality: N/A;  . Total knee arthroplasty Left 10/06/2013    Procedure: LEFT TOTAL KNEE ARTHROPLASTY;  Surgeon: Mcarthur Rossetti, MD;  Location: [facility];  Service: Orthopedics;  Laterality: Left;   Family History  Problem Relation Age of Onset  . Diabetes Mother   . Cancer Father     brain tumor, colon  . Diabetes Father   . Heart disease Father   . Heart disease Paternal Uncle   . Diabetes Paternal Uncle   . Diabetes Maternal Grandmother   . Heart disease Paternal Grandmother   . Heart disease Paternal Grandfather   . Diabetes Maternal Aunt   . Diabetes Maternal Uncle   . Diabetes Paternal Aunt   . Diabetes Maternal Grandfather    History  Substance Use Topics  . Smoking status: Never Smoker   .  Smokeless tobacco: Never Used  . Alcohol Use: Yes     Comment: maybe once a month   OB History   Grav Para Term Preterm Abortions TAB SAB Ect Mult Living                 Review of Systems  Genitourinary: Positive for flank pain.  All other systems reviewed and are negative.     Allergies  Codeine  Home Medications   Prior to Admission medications   Medication Sig Start Date End Date Taking? Authorizing Provider  ALPRAZolam Duanne Moron) 1 MG tablet Take 1 mg by mouth 2 (two) times daily as needed for anxiety.  01/19/12   Montez Morita, MD  fluticasone Asencion Islam) 50 MCG/ACT nasal spray  08/11/13   Historical Provider, MD  lamoTRIgine (LAMICTAL) 100 MG tablet Take 100 mg by mouth at  bedtime.    Historical Provider, MD  lisinopril (PRINIVIL,ZESTRIL) 10 MG tablet Take 10 mg by mouth every morning.    Historical Provider, MD  omeprazole (PRILOSEC) 40 MG capsule Take 40 mg by mouth daily.    Historical Provider, MD  oxyCODONE-acetaminophen (PERCOCET) 7.5-325 MG per tablet Take 1 tablet by mouth every 6 (six) hours as needed for pain. 01/03/14   Dickie La, MD  risperiDONE (RISPERDAL) 1 MG tablet Take 1 mg by mouth at bedtime.    Historical Provider, MD  rivaroxaban (XARELTO) 20 MG TABS tablet Take 1 tablet (20 mg total) by mouth daily with supper. 11/08/13   Dickie La, MD  tiZANidine (ZANAFLEX) 4 MG tablet Take 1 tablet (4 mg total) by mouth every 6 (six) hours as needed for muscle spasms. 10/09/13   Mcarthur Rossetti, MD  triamcinolone ointment (KENALOG) 0.1 %  09/06/13   Historical Provider, MD   BP 131/87  Pulse 79  Temp(Src) 98.1 F (36.7 C) (Oral)  Resp 19  Ht 5\' 7"  (1.702 m)  Wt 306 lb (138.801 kg)  BMI 47.92 kg/m2  SpO2 100% Physical Exam  Nursing note and vitals reviewed. Constitutional: She is oriented to person, place, and time. She appears well-developed and well-nourished.  Non-toxic appearance. No distress.  HENT:  Head: Normocephalic and atraumatic.  Eyes: Conjunctivae, EOM and lids are normal. Pupils are equal, round, and reactive to light.  Neck: Normal range of motion. Neck supple. No tracheal deviation present. No mass present.  Cardiovascular: Normal rate, regular rhythm and normal heart sounds.  Exam reveals no gallop.   No murmur heard. Pulmonary/Chest: Effort normal and breath sounds normal. No stridor. No respiratory distress. She has no decreased breath sounds. She has no wheezes. She has no rhonchi. She has no rales.  Abdominal: Soft. Normal appearance and bowel sounds are normal. She exhibits no distension. There is no tenderness. There is CVA tenderness. There is no rigidity, no rebound and no guarding.  Musculoskeletal: Normal range of  motion. She exhibits no edema and no tenderness.  Neurological: She is alert and oriented to person, place, and time. She has normal strength. No cranial nerve deficit or sensory deficit. GCS eye subscore is 4. GCS verbal subscore is 5. GCS motor subscore is 6.  Skin: Skin is warm and dry. No abrasion and no rash noted.  Psychiatric: She has a normal mood and affect. Her speech is normal and behavior is normal.    ED Course  Procedures (including critical care time) Labs Review Labs Reviewed  URINALYSIS, ROUTINE W REFLEX MICROSCOPIC - Abnormal; Notable for the following:  APPearance CLOUDY (*)    Hgb urine dipstick MODERATE (*)    All other components within normal limits  URINE MICROSCOPIC-ADD ON - Abnormal; Notable for the following:    Bacteria, UA MANY (*)    All other components within normal limits    Imaging Review No results found.   EKG Interpretation None      MDM   Final diagnoses:  None    Patient had a CT scan which was negative. Pain meds given here and she feels better. Patient's urinalysis with hospital infection will place her antibiotics   Leota Jacobsen, MD 01/16/14 [phone]

## 2014-01-17 ENCOUNTER — Ambulatory Visit (INDEPENDENT_AMBULATORY_CARE_PROVIDER_SITE_OTHER): Payer: Medicare Other | Admitting: Family Medicine

## 2014-01-17 ENCOUNTER — Encounter: Payer: Self-pay | Admitting: Family Medicine

## 2014-01-17 VITALS — BP 128/52 | HR 86 | Temp 98.8°F | Ht 67.0 in | Wt 300.0 lb

## 2014-01-17 DIAGNOSIS — R109 Unspecified abdominal pain: Secondary | ICD-10-CM | POA: Insufficient documentation

## 2014-01-17 MED ORDER — KETOROLAC TROMETHAMINE 60 MG/2ML IM SOLN: 60.0000 mg | Freq: Once | INTRAMUSCULAR | Status: DC

## 2014-01-17 NOTE — Assessment & Plan Note (Signed)
Appropriate CT in ED effectively ruled out malicious conditions. No kidney stones, hydronephrosis, pelvic free fluid; no appendicitis or ovarian torsion. Mention of vascular congestion likely incidental. Being treated for UTI  - Push hydration  - Continue analgesics  - Follow up in 1 week  - Consider repeat scan if still symptomatic and repeat urinalysis with painless hematuria work up with cystoscopy if this continues.

## 2014-01-17 NOTE — Progress Notes (Signed)
Patient ID: Crystal Garza, female   DOB: October 06, 1970, 43 y.o.   MRN: [id number]   Subjective:  Crystal Garza is a 43 y.o. female here for right-lower side/abdominal pain.  Quick-onset pain starting 2 days ago without visible hematuria. She has a history of kidney stones and believes this colicky severe right flank/side pain is similar. Seen for this at ED yesterday with negative CT scan. No back pain or radiation down leg. Has had hysterectomy but still has ovaries. Is on xarelto for recent DVT following left TKA.   No changes in bowel habits, no blood in stool.   All other pertinent systems reviewed and are negative. Objective:  BP 128/52  Pulse 86  Temp(Src) 98.8 F (37.1 C) (Oral)  Ht 5\' 7"  (1.702 m)  Wt 300 lb (136.079 kg)  BMI 46.98 kg/m2  Gen: Morbidly obese 43 y.o. female in NAD CV: RRR, no MRG, no JVD Resp: Non-labored, CTAB, no wheezes noted Abd: Soft, very obese, + right flank CVA tenderness, otherwise nontender and nondistended with + BS. Assessment:  Crystal Garza is a 43 y.o. female here for flank pain.  Plan:  Right sided lower abdominal pain: Appropriate CT in ED effectively ruled out malicious conditions. No kidney stones, hydronephrosis, pelvic free fluid; no appendicitis or ovarian torsion. Mention of vascular congestion likely incidental. Being treated for UTI. - Toradol 60mg  IM x1 - Push hydration - Continue analgesics - Follow up in 1 week - Consider repeat scan if still symptomatic and repeat urinalysis with painless hematuria work up with cystoscopy if this continues.

## 2014-01-31 ENCOUNTER — Ambulatory Visit (INDEPENDENT_AMBULATORY_CARE_PROVIDER_SITE_OTHER): Payer: Medicare Other | Admitting: Family Medicine

## 2014-01-31 ENCOUNTER — Encounter: Payer: Self-pay | Admitting: Family Medicine

## 2014-01-31 VITALS — BP 123/68 | HR 89 | Temp 98.0°F | Ht 67.0 in | Wt 300.3 lb

## 2014-01-31 DIAGNOSIS — Z96652 Presence of left artificial knee joint: Secondary | ICD-10-CM

## 2014-01-31 DIAGNOSIS — I82403 Acute embolism and thrombosis of unspecified deep veins of lower extremity, bilateral: Secondary | ICD-10-CM

## 2014-01-31 DIAGNOSIS — Z96659 Presence of unspecified artificial knee joint: Secondary | ICD-10-CM

## 2014-01-31 DIAGNOSIS — I82409 Acute embolism and thrombosis of unspecified deep veins of unspecified lower extremity: Secondary | ICD-10-CM

## 2014-01-31 DIAGNOSIS — C49A2 Gastrointestinal stromal tumor of stomach: Secondary | ICD-10-CM

## 2014-01-31 DIAGNOSIS — D481 Neoplasm of uncertain behavior of connective and other soft tissue: Secondary | ICD-10-CM

## 2014-01-31 MED ORDER — OXYCODONE-ACETAMINOPHEN 7.5-325 MG PO TABS: 1.0000 | ORAL_TABLET | Freq: Four times a day (QID) | ORAL | Status: DC | PRN

## 2014-02-02 NOTE — Assessment & Plan Note (Signed)
No abdominal issues at this time. No change in her bowel habits.

## 2014-02-02 NOTE — Assessment & Plan Note (Signed)
Significant improvement in her flexion. Continues to rehabilitation. We'll continue provide pain medication for the next month or 2 and I think we can taper her off. Followup one month

## 2014-02-02 NOTE — Progress Notes (Signed)
   Subjective:    Patient ID: Crystal Garza, female    DOB: 1970-06-04, 43 y.o.   MRN: [id number]  HPI Followup left knee TKR. Improved flexion. Had part of the scar break up a little bit with some serous leakage is concerned about that but is otherwise doing well. Has gone back to work. Still has a lot of pain particularly at night. She still ever so often using the cane but 90% time she is weightbearing without it. #2. Continues on the xarelto without problem. No blood in urine, no bleeding gums, nose unusual fatigue.   Review of Systems At Pain. No fever, sweats, chills, unusual weight change.    Objective:   Physical Exam   GENERAL: Well-developed female no acute distress VITAL signs reviewed. KNEE: Left. The vertical scar is healing fairly well although there is one area of some open skin it is appears superficial. She is having keloid formation. Her flexion is now for about 100. Extension is full. Calf is soft. Distally neurovascularly intact. Skin: No sign of bruising.      Assessment & Plan:

## 2014-02-02 NOTE — Assessment & Plan Note (Signed)
Continues are also. Given her history of GI ST tumor, would consider lifelong anticoagulation.

## 2014-02-07 ENCOUNTER — Ambulatory Visit: Payer: Medicare Other | Admitting: Family Medicine

## 2014-02-21 ENCOUNTER — Ambulatory Visit (INDEPENDENT_AMBULATORY_CARE_PROVIDER_SITE_OTHER): Payer: Medicare Other | Admitting: Family Medicine

## 2014-02-21 ENCOUNTER — Encounter: Payer: Self-pay | Admitting: Family Medicine

## 2014-02-21 VITALS — BP 135/69 | HR 71 | Temp 98.3°F | Ht 67.0 in | Wt 302.7 lb

## 2014-02-21 DIAGNOSIS — Z96652 Presence of left artificial knee joint: Secondary | ICD-10-CM

## 2014-02-21 MED ORDER — OXYCODONE-ACETAMINOPHEN 7.5-325 MG PO TABS: ORAL_TABLET | ORAL | Status: DC

## 2014-02-21 MED ORDER — TIZANIDINE HCL 4 MG PO TABS: 4.0000 mg | ORAL_TABLET | Freq: Four times a day (QID) | ORAL | Status: DC | PRN

## 2014-02-21 MED ORDER — TIZANIDINE HCL 4 MG PO TABS: 4.0000 mg | ORAL_TABLET | Freq: Two times a day (BID) | ORAL | Status: DC | PRN

## 2014-02-21 NOTE — Assessment & Plan Note (Signed)
Continues to improve although slowly. I will continue her pain medicines at this dosage for this month and then we discussed decreasing them the following month with the plan to taper her off these medications.

## 2014-02-21 NOTE — Progress Notes (Signed)
   Subjective:    Patient ID: Crystal Garza, female    DOB: Oct 12, 1970, 43 y.o.   MRN: [id number]  HPI Continued problems with her knees. The left knee continues to improve although she's having more swelling now she's back to work. She continues to work on her flexion extension to try to get him back from her surgical procedure, TKR. Her right knee is bothering her more now, she thinks partly because she's been using it to favor stress off the left leg while it recovered. She's also starting to have some right posterior hip pain that she thinks is from the same reason. #2. Has some paperwork to be filled out for transportation to doctor's offices. She's planning on moving to [facility] in the near future. She's investigated housing they're aware her doctor in work would most likely be in think she will need to get theS CAT plan. She brings a form with her. #3. Obesity. She seems to have plateaued in her weight loss right now. She's a little. 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 She continues to have bilateral knee pain her he she's had some left knee swelling. No fever, sweats, chills. No calf pain. No abdominal pain.    Objective:   Physical Exam  Vital signs are reviewed GENERAL: Well-developed overweight female no acute distress KNEE: Left. Vertical anterior scar is healing well with some keloid formation. She has flexion to about 110. Calf is soft. Her extension is full. Then he on the right has some very mild medial joint line tenderness but has full range of motion extension and flexion.. Distally she is neurovascularly intact.      Assessment & Plan:  Regarding her paperwork, I filled that out for her to get assistance with transportation #2. Regarding her obesity, we talked about food portion sizes. I'll see her in one month

## 2014-02-28 ENCOUNTER — Ambulatory Visit: Payer: Medicare Other | Admitting: Family Medicine

## 2014-03-21 ENCOUNTER — Encounter: Payer: Self-pay | Admitting: Family Medicine

## 2014-03-21 ENCOUNTER — Ambulatory Visit (INDEPENDENT_AMBULATORY_CARE_PROVIDER_SITE_OTHER): Payer: Medicare Other | Admitting: Family Medicine

## 2014-03-21 VITALS — BP 119/44 | HR 73 | Temp 97.6°F | Ht 67.0 in | Wt 304.2 lb

## 2014-03-21 DIAGNOSIS — I82403 Acute embolism and thrombosis of unspecified deep veins of lower extremity, bilateral: Secondary | ICD-10-CM

## 2014-03-21 DIAGNOSIS — Z96652 Presence of left artificial knee joint: Secondary | ICD-10-CM

## 2014-03-21 MED ORDER — OXYCODONE-ACETAMINOPHEN 7.5-325 MG PO TABS: 1.0000 | ORAL_TABLET | Freq: Three times a day (TID) | ORAL | Status: DC | PRN

## 2014-03-21 MED ORDER — OXYCODONE-ACETAMINOPHEN 7.5-325 MG PO TABS: ORAL_TABLET | ORAL | Status: DC

## 2014-03-21 NOTE — Assessment & Plan Note (Signed)
Weight loss has plateaued.

## 2014-03-21 NOTE — Progress Notes (Signed)
   Subjective:    Patient ID: Crystal Garza, female    DOB: [birth year], 43 y.o.   MRN: [id number]  HPI Follow-up bilateral knee pain and recent left TKR. Total knee replacement is doing much better. Still feels stiff. Still having some extremity swelling on that side after she stands all day. I She had  planned to move to [place]  but after a brief two-week stay there she changed her mind and has returned to live here. 2 weeks ago she had a fall secondary to her right knee giving out. Had some bruising and stiffness for seems to be resolving.   Review of Systems No fever, sweats, chills, unusual weight change. Positive for knee pain.    Objective:   Physical Exam  Vital signs reviewed. GENERAL: Well-developed, well-nourished, no acute distress. CARDIOVASCULAR: Regular rate and rhythm no murmur gallop or rub LUNGS: Clear to auscultation bilaterally, no rales or wheeze. ABDOMEN: Soft positive bowel sounds NEURO: No gross focal neurological deficits. MSK: Movement of extremity x 4. Left knee full extension, flexion 210. The midline scar is healing well.        Assessment & Plan:

## 2014-03-21 NOTE — Assessment & Plan Note (Signed)
We will begin a taper down of her pain medicine as we had discussed. I'll see her back in one month which time we'll plan to taper further.

## 2014-03-21 NOTE — Assessment & Plan Note (Signed)
Continue anticoagulation unchanged.

## 2014-04-05 ENCOUNTER — Other Ambulatory Visit (INDEPENDENT_AMBULATORY_CARE_PROVIDER_SITE_OTHER): Payer: Self-pay | Admitting: Physician Assistant

## 2014-04-05 DIAGNOSIS — Z903 Acquired absence of stomach [part of]: Secondary | ICD-10-CM

## 2014-04-05 DIAGNOSIS — Z13220 Encounter for screening for lipoid disorders: Secondary | ICD-10-CM

## 2014-04-09 ENCOUNTER — Telehealth: Payer: Self-pay | Admitting: Family Medicine

## 2014-04-09 NOTE — Telephone Encounter (Signed)
Checking status of orders sent for pt to receive a back brace.

## 2014-04-11 ENCOUNTER — Encounter: Payer: Self-pay | Admitting: Family Medicine

## 2014-04-11 ENCOUNTER — Ambulatory Visit (INDEPENDENT_AMBULATORY_CARE_PROVIDER_SITE_OTHER): Payer: Medicare Other | Admitting: Family Medicine

## 2014-04-11 VITALS — Temp 98.0°F | Ht 67.0 in | Wt 309.3 lb

## 2014-04-11 DIAGNOSIS — M546 Pain in thoracic spine: Secondary | ICD-10-CM

## 2014-04-11 DIAGNOSIS — M545 Low back pain, unspecified: Secondary | ICD-10-CM

## 2014-04-11 DIAGNOSIS — G8929 Other chronic pain: Secondary | ICD-10-CM | POA: Insufficient documentation

## 2014-04-11 DIAGNOSIS — I82403 Acute embolism and thrombosis of unspecified deep veins of lower extremity, bilateral: Secondary | ICD-10-CM

## 2014-04-11 DIAGNOSIS — Z96652 Presence of left artificial knee joint: Secondary | ICD-10-CM

## 2014-04-11 MED ORDER — OXYCODONE-ACETAMINOPHEN 7.5-325 MG PO TABS: ORAL_TABLET | ORAL | Status: DC

## 2014-04-11 NOTE — Assessment & Plan Note (Signed)
Chronic, functional low back pain partly related to habitus and deconditioning. She had gotten a form from a medical supply house for a back brace. We discussed at length. This has not been proven to be useful. I would agree to send her to physical therapy for evaluation of a TENS unit and we'll set that up.

## 2014-04-11 NOTE — Progress Notes (Signed)
   Subjective:    Patient ID: Crystal Garza, female    DOB: 03-29-1971, 43 y.o.   MRN: [id number]  HPI #1. Follow-up knee pain. Status post left total knee replacement. Pain is improving. She is back to work full-time. We had decreased her pain medicine last office visit and she's had a lot of breakthrough pain so she would like to slow down the taper off of that. #2. Having some low back pain midline occasionally radiates down to the left ankle in the posterior part of the leg. No incontinence of bowel or bladder. No leg weakness. This pain is been present for years just seems a little worse since she had her knee surgery. Request back brace her TENS unit. #3. Chronic neck pain is bothering her more recently. Has questions about whether not she needs a referral for chiropractic care.   Review of Systems See history of present illness above. Additional pertinent review of systems negative for fever, sweats, chills, unusual weight change. No left calf pain.    Objective:   Physical Exam Vital signs reviewed. GENERAL: Well-developed, well-nourished, no acute distress. MSK: Movement of extremity x 4. L knee incision well healed. Full extension and flexion. Extension 4-5/5 on left, 5/5 right BACK non tender, no deformity or mass. Limited flexion at hips secondary to low back muscle and hamstring tightness Hip flexors 5/5 B=         Assessment & Plan:

## 2014-04-11 NOTE — Assessment & Plan Note (Signed)
Continues on Xarelto without problem. As per previous discussions would plan chronic anticoagulation given her history ofGIST tumor, obesity and decreased mobility

## 2014-04-11 NOTE — Assessment & Plan Note (Signed)
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 She did not do well on the decreased pain medicine so will give her one more month at the 3 times a day dosing and when I see her back in January the plan is to decrease her to twice a day in the following month taper to a when necessary with the ultimate goal of getting her totally off narcotic pain medicines.

## 2014-04-12 NOTE — Telephone Encounter (Signed)
Discussed with her at [facility] Does not need brace, does not qualify Crystal Garza

## 2014-04-19 ENCOUNTER — Ambulatory Visit: Payer: Medicare Other

## 2014-04-20 ENCOUNTER — Ambulatory Visit: Payer: Medicare Other | Attending: Family Medicine

## 2014-04-20 DIAGNOSIS — M545 Low back pain, unspecified: Secondary | ICD-10-CM

## 2014-04-20 NOTE — Therapy (Signed)
[place], [place], [address] Phone: [phone]   Fax:  [phone]  Physical Therapy Evaluation  Patient Details  Name: Crystal Garza MRN: [id number] Date of Birth: [birth year]  Encounter Date: 04/20/2014    Past Medical History  Diagnosis Date  . Anemia     iron def  . Hypertension   . Morbid obesity   . GERD (gastroesophageal reflux disease)   . Anxiety   . Depression   . Migraine   . Back pain   . Chronic knee pain   . Arthritis     left knee   . Hx of laparoscopic gastric banding   . Anginal pain   . Kidney stones   . DVT (deep venous thrombosis)   . PE (pulmonary embolism)     Past Surgical History  Procedure Laterality Date  . Abdominal hysterectomy    . Knee arthroscopy    . Cesarean section  05/1991  . Breath tek h pylori  07/28/2011    Procedure: BREATH TEK H PYLORI;  Surgeon: [name], MD;  Location: [facility];  Service: General;  Laterality: N/A;  to be done at 745  . Esophageal biopsy  02/16/2012    Procedure: BIOPSY;  Surgeon: [name], MD;  Location: [facility];  Service: General;;  biopsy of mass x 2  . Eus  03/03/2012    Procedure: UPPER ENDOSCOPIC ULTRASOUND (EUS) LINEAR;  Surgeon: Milus Banister, MD;  Location: [facility];  Service: Endoscopy;  Laterality: N/A;  . Tubal ligation    . Laparoscopic gastrectomy  04/08/2012    Procedure: LAPAROSCOPIC GASTRECTOMY;  Surgeon: [name], MD;  Location: [facility];  Service: General;;  removal of GIST tumor of stomach  . Laparoscopic gastric sleeve resection N/A 08/29/2012    Procedure: LAPAROSCOPIC GASTRIC SLEEVE RESECTION;  Surgeon: [name], MD;  Location: [facility];  Service: General;  Laterality: N/A;  Sleeve Gastrectomy  . Esophagogastroduodenoscopy N/A 08/29/2012    Procedure: ESOPHAGOGASTRODUODENOSCOPY (EGD);  Surgeon: [name], MD;  Location: [facility];  Service: General;  Laterality: N/A;  .  Total knee arthroplasty Left 10/06/2013    Procedure: LEFT TOTAL KNEE ARTHROPLASTY;  Surgeon: Mcarthur Rossetti, MD;  Location: [facility];  Service: Orthopedics;  Laterality: Left;    There were no vitals taken for this visit.  Visit Diagnosis:  No diagnosis found.      Subjective Assessment - 04/20/14 0806    Symptoms Chronic LBP and she is wanting a TENS unit.         MS Allred has a Medicare plan through [facility]. Medicare does not cover TENS units for chronic LBP so an evaluation was not performed. She was issued information on purchasing a TENS unit. Sorry we could not help other than information to her for self purchase of TENS unit..                                  Problem List Patient Active Problem List   Diagnosis Date Noted  . Back pain 04/11/2014  . Right flank pain 01/17/2014  . DVT, bilateral lower limbs 10/08/2013  . Degenerative arthritis of left knee 10/06/2013  . Status post total knee replacement 10/06/2013  . Edema 07/28/2013  . Chronic pain 06/13/2013  . 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  Bipolar disorder 01/19/2012  . Nephrolithiasis 02/20/2011  . Eczematous dermatitis 10/22/2010  . Iron deficiency anemia 06/27/2010  . Anxiety 06/27/2010  . MIGRAINE, CHRONIC 09/12/2009  . Morbid obesity-BMI 62 10/30/2008  . OSTEOARTHRITIS, KNEES, BILATERAL 07/05/2007  . DEPRESSION, MAJOR, RECURRENT 07/01/2006  . HYPERTENSION, BENIGN SYSTEMIC 07/01/2006  . RHINITIS, ALLERGIC 07/01/2006  . GASTROESOPHAGEAL REFLUX, NO ESOPHAGITIS 07/01/2006    Darrel Hoover PT 04/20/2014, 8:23 AM  [facility] [address] [place], [place], [address] Phone: [phone]   Fax:  [phone]

## 2014-04-25 ENCOUNTER — Other Ambulatory Visit: Payer: Self-pay | Admitting: Orthopaedic Surgery

## 2014-04-25 DIAGNOSIS — M545 Low back pain: Secondary | ICD-10-CM

## 2014-05-01 ENCOUNTER — Telehealth: Payer: Self-pay | Admitting: *Deleted

## 2014-05-01 NOTE — Telephone Encounter (Signed)
Pt called stating she was released from her current psychiatrist due to missing too many appts.  She is currently being treated with medication and needed to know where she can go for treatment.  Informed her of behavioral health and [facility].  Number was given for her to call [facility] and she was going to go by behavioral health to inquire about establishing care.  Darrick Greenlaw,CMA

## 2014-05-05 ENCOUNTER — Other Ambulatory Visit: Payer: Medicare Other

## 2014-05-06 ENCOUNTER — Ambulatory Visit
Admission: RE | Admit: 2014-05-06 | Discharge: 2014-05-06 | Disposition: A | Discharge status: Home / Self Care | Payer: Medicare Other | Source: Ambulatory Visit | Attending: Orthopaedic Surgery | Admitting: Orthopaedic Surgery

## 2014-05-06 DIAGNOSIS — M4316 Spondylolisthesis, lumbar region: Secondary | ICD-10-CM | POA: Diagnosis not present

## 2014-05-06 DIAGNOSIS — M5126 Other intervertebral disc displacement, lumbar region: Secondary | ICD-10-CM | POA: Diagnosis not present

## 2014-05-06 DIAGNOSIS — M47817 Spondylosis without myelopathy or radiculopathy, lumbosacral region: Secondary | ICD-10-CM | POA: Diagnosis not present

## 2014-05-06 DIAGNOSIS — M545 Low back pain: Secondary | ICD-10-CM

## 2014-05-09 DIAGNOSIS — M542 Cervicalgia: Secondary | ICD-10-CM | POA: Diagnosis not present

## 2014-05-09 DIAGNOSIS — M545 Low back pain: Secondary | ICD-10-CM | POA: Diagnosis not present

## 2014-05-10 ENCOUNTER — Telehealth: Payer: Self-pay | Admitting: Family Medicine

## 2014-05-10 NOTE — Telephone Encounter (Signed)
LMOVM for pt to call us back. Please inform that TB letter in upfront. Tannya Gonet Kennon Holter, CMA

## 2014-05-10 NOTE — Telephone Encounter (Signed)
[name] need a copy of her tb test result preferably today if possible.  Please contact patient when ready.

## 2014-05-16 ENCOUNTER — Ambulatory Visit (INDEPENDENT_AMBULATORY_CARE_PROVIDER_SITE_OTHER): Payer: Medicare Other | Admitting: Family Medicine

## 2014-05-16 ENCOUNTER — Encounter: Payer: Self-pay | Admitting: Family Medicine

## 2014-05-16 VITALS — BP 120/48 | HR 80 | Temp 98.3°F | Ht 67.0 in | Wt 309.8 lb

## 2014-05-16 DIAGNOSIS — G8929 Other chronic pain: Secondary | ICD-10-CM | POA: Diagnosis not present

## 2014-05-16 DIAGNOSIS — Z7901 Long term (current) use of anticoagulants: Secondary | ICD-10-CM

## 2014-05-16 DIAGNOSIS — F3170 Bipolar disorder, currently in remission, most recent episode unspecified: Secondary | ICD-10-CM

## 2014-05-16 MED ORDER — TIZANIDINE HCL 4 MG PO TABS: 4.0000 mg | ORAL_TABLET | Freq: Two times a day (BID) | ORAL | Status: DC | PRN

## 2014-05-16 MED ORDER — TRAMADOL HCL 50 MG PO TBDP: ORAL_TABLET | ORAL | Status: DC

## 2014-05-16 MED ORDER — OXYCODONE-ACETAMINOPHEN 7.5-325 MG PO TABS: ORAL_TABLET | ORAL | Status: DC

## 2014-05-16 NOTE — Patient Instructions (Signed)
As we discussed I am switching you to tramadol for your chronic pain I have given you 30 tablets of the Percocet to use over the next month, 1 a day if you need them. We will be refilling the Percocet at all in the future, as I want to make the tramadol your mainstay for pain relief. I am going to send you a handout in the mail about some back exercises. Let me see you back in 6-8 weeks for follow-up of your back and other issues. Regarding the blood thinner, I do think given your history of GIST tumor in your stomach that I would recommend indefinite anticoagulation with once daily xarelto. If you started having bleeding problems then we could reassess that but overall I think your risk of repeat clot is greater than risk of bleeding.

## 2014-05-17 DIAGNOSIS — Z7901 Long term (current) use of anticoagulants: Secondary | ICD-10-CM | POA: Insufficient documentation

## 2014-05-17 NOTE — Progress Notes (Signed)
   Subjective:    Patient ID: Crystal Garza, female    DOB: 08/12/1970, 44 y.o.   MRN: [id number]  HPI #1. Follow-up knee pain status post knee replacement. Also complaining of worsening of her chronic back pain which is located mostly in the low area but today also in the posterior right shoulder. She says she had a corticosteroid shot at another clinic in the left shoulder about 2 weeks ago that helped. The right posterior and neck pain is chronic, 4-6 out of 10. Worse with overhead reaching or with pulling anything towards her. 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 She has some aching pain at night and increase in her pain with activity. She has been using a muscle relaxer couple times a day which really seems to help particularly at night. Has no true chest pain is more of a anterior shoulder pain radiating to the posterior shoulder and up into the right neck, only with exertion and some aching pain at night. #2. Has questions about anticoagulation. One of her friends that she should be off her anticoagulation by now. #3. Bipolar disorder managed by mental health systems. No recent med changes. #4. No stomach upset or change in her bowel habits.   Review of Systems See history of present illness above. She's had no unusual bleeding or bruising. Energy levels about the same.    Objective:   Physical Exam  Vital signs reviewed. GENERAL: Well-developed, well-nourished, no acute distress. CARDIOVASCULAR: Regular rate and rhythm no murmur gallop or rub LUNGS: Clear to auscultation bilaterally, no rales or wheeze. ABDOMEN: Soft positive bowel sounds NEURO: No gross focal neurological deficits. MSK: Movement of extremity x 4. Knee extension full bilaterally. Calf bilaterally is soft. NECK: Full range of motion although she has some muscle tightness in tenderness in the right sternocleidomastoid muscle. She also has some muscle  spasm in the right trapezius and deep palpation near reproduces her pain.  SHOULDER: Right. She has full range of motion of the right shoulder and normal strength in all planes the rotator cuff.        Assessment & Plan:

## 2014-05-17 NOTE — Assessment & Plan Note (Signed)
She continues in follow-up with mental health system.

## 2014-05-17 NOTE — Assessment & Plan Note (Signed)
She continues to have back pain and knee pain. I'm going to give her handout on low back exercises. She cannot afford the co-pay to get a physical therapy. Wants to taper her totally off narcotics and transition her to tramadol. She's not excited about this transition breast exam her best interest. Today I gave her the last narcotic prescription to help her taper up on her tramadol from the next month I'll see her back in one month.

## 2014-05-17 NOTE — Assessment & Plan Note (Signed)
We've had this conversation a couple of times and we reviewed it in some detail today. Certainly she could go off anticoagulation and she has completed treatment for her previous clot but given her history of gastrointestinal stromal tumor and the uncertainty associated with that as well as her risk factor of morbid obesity, my recommendation is to continue with anticoagulation indefinitely unless she starts to develop other problems. She agrees to this plan today.

## 2014-05-22 ENCOUNTER — Telehealth: Payer: Self-pay | Admitting: Family Medicine

## 2014-05-22 DIAGNOSIS — M25579 Pain in unspecified ankle and joints of unspecified foot: Secondary | ICD-10-CM

## 2014-05-22 NOTE — Telephone Encounter (Signed)
Dear [name] Team [place] I need some more info What does she need to see them for? Has she seen them before? What is her insurance ? THANKS! [name]

## 2014-05-22 NOTE — Telephone Encounter (Signed)
LVM for patient to call back to find out below information.

## 2014-05-22 NOTE — Telephone Encounter (Signed)
Pt called and would like a referral to the [facility]. jw

## 2014-05-23 DIAGNOSIS — M25579 Pain in unspecified ankle and joints of unspecified foot: Secondary | ICD-10-CM | POA: Insufficient documentation

## 2014-05-23 NOTE — Telephone Encounter (Signed)
Spoke with pt an informed her that the referral had been submitted and that it was placed in the [facility] workque and that she would be contacted by them for an appointment. Pt understood. Katharina Caper, April D

## 2014-05-23 NOTE — Telephone Encounter (Signed)
Dear [name] Team [place] [facility] I have put in referral for podiatry--does she make appt or do we? Please let her know and either make appt or tell her to. THANKS! [name]

## 2014-05-23 NOTE — Telephone Encounter (Signed)
Pt returned call and stated that she is having a lot of problems with her foot.  Stated that [place] says she has plateau ? Pt unsure exactly what it is called, asked pt if she knew the spelling of it and she did not.  Also, she stated she has a knot under her other foot.  States she has a hard time walking due to her foot situation. She has been to [facility] years ago she stated due to being flat footed.  She has Medicare/Medicaid. Katharina Caper, Greta Yung D

## 2014-06-08 ENCOUNTER — Ambulatory Visit (INDEPENDENT_AMBULATORY_CARE_PROVIDER_SITE_OTHER): Payer: Medicare Other | Admitting: Podiatry

## 2014-06-08 ENCOUNTER — Ambulatory Visit (INDEPENDENT_AMBULATORY_CARE_PROVIDER_SITE_OTHER): Payer: Medicare Other

## 2014-06-08 ENCOUNTER — Encounter: Payer: Self-pay | Admitting: Podiatry

## 2014-06-08 VITALS — BP 134/91 | HR 85 | Resp 13 | Ht 67.0 in | Wt 310.0 lb

## 2014-06-08 DIAGNOSIS — M79671 Pain in right foot: Secondary | ICD-10-CM | POA: Diagnosis not present

## 2014-06-08 DIAGNOSIS — M722 Plantar fascial fibromatosis: Secondary | ICD-10-CM

## 2014-06-08 NOTE — Progress Notes (Signed)
   Subjective:    Patient ID: Crystal Garza, female    DOB: 10-08-1970, 44 y.o.   MRN: [id number]  HPI 44 year old female presents the office with complaints of bilateral foot pain. She states that on the left foot she has a flatfoot and she has pain within the arch. On the right foot she states that she has pain in the heel for which she feels a small lump. She states that his been ongoing for several months. She states that she has pain particularly in the morning which is really by ambulation and after standing for long periods of time. She denies any recent injury or trauma to the area. She denies any swelling or redness overlying the area. The pain does not wake her up at night. No other complaints at this time.   Review of Systems  Musculoskeletal: Positive for myalgias, back pain and arthralgias.  All other systems reviewed and are negative.      Objective:   Physical Exam AAO 3, NAD DP/PT pulses palpable, CRT less than 3 seconds Protective sensation intact with Simms Weinstein monofilament, vibratory sensation intact, Achilles tendon reflex intact. On the right foot there is tenderness palpation upon the plantar aspect of the calcaneus at the insertion of the plantar fascia. There is atrophy of the fat pad within the heel. There is no pain with lateral compression of the calcaneus or pain with vibratory sensation. No pain along the course of plantar fascial within the arch of the foot in the ligament appears to be intact. There is no pain on the posterior aspect of the calcaneus or along the course/insertion of the Achilles tendon. There is no palpable mass within the plantar aspect of the right heel. On the left foot there is mild discomfort along the medial arch along the medial band of the plantar fascia. There is a significant decrease in medial arch height bilaterally. No other areas of tenderness to bilateral lower extremity is no overlying edema, erythema, increase in warmth  bilaterally. MMT 5/5, ROM WNL No open lesions or pre-ulcerative lesions are identified. No pain with calf compression, swelling, warmth, erythema.     Assessment & Plan:  45 year old female with right foot heel pain, left arch pain -X-rays were obtained and reviewed the patient. -Treatment options were discussed including alternatives, risks, complications. Discussed likely etiology per symptoms. -At this time we'll hold off on steroid injection into the right heel as she has atrophy the fat pad which is likely contribute to her discomfort -Given her foot type as well as the areas of discomfort she would likely benefit from around accommodate orthotic to help support her foot type as well as take pressure off the heels. A prescription for orthotics was given to the patient. -Discussed stretching exercises also ice to the area. -Follow-up after orthotics or sooner if any problems are to arise. In the meantime, encouraged to call the office with any questions, concerns, change in symptoms. Follow-up with PCP for other issues mentioned in the review of systems.

## 2014-06-12 ENCOUNTER — Telehealth: Payer: Self-pay | Admitting: *Deleted

## 2014-06-12 NOTE — Telephone Encounter (Addendum)
Pt states [employer] says [employer] will not cover the prescription orthotics.   Informed pt of Dr. Leigh Aurora recommendation, pt states she will come by Friday to purchase a pair of size 10.

## 2014-06-12 NOTE — Telephone Encounter (Signed)
We can do an OTC pair. Can discuss powersteps or looking at another OTC pair. Thanks.

## 2014-06-20 DIAGNOSIS — M542 Cervicalgia: Secondary | ICD-10-CM | POA: Diagnosis not present

## 2014-06-26 DIAGNOSIS — M545 Low back pain: Secondary | ICD-10-CM | POA: Diagnosis not present

## 2014-06-26 DIAGNOSIS — M47816 Spondylosis without myelopathy or radiculopathy, lumbar region: Secondary | ICD-10-CM | POA: Diagnosis not present

## 2014-07-06 ENCOUNTER — Encounter: Payer: Self-pay | Admitting: Podiatry

## 2014-07-06 ENCOUNTER — Ambulatory Visit (INDEPENDENT_AMBULATORY_CARE_PROVIDER_SITE_OTHER): Payer: Medicare Other | Admitting: Podiatry

## 2014-07-06 VITALS — BP 122/73 | HR 95 | Resp 15

## 2014-07-06 DIAGNOSIS — M79671 Pain in right foot: Secondary | ICD-10-CM

## 2014-07-06 DIAGNOSIS — M79672 Pain in left foot: Secondary | ICD-10-CM

## 2014-07-06 DIAGNOSIS — R0989 Other specified symptoms and signs involving the circulatory and respiratory systems: Secondary | ICD-10-CM | POA: Diagnosis not present

## 2014-07-08 NOTE — Progress Notes (Signed)
Patient ID: Crystal Garza, female   DOB: January 12, 1971, 44 y.o.   MRN: [id number]  Subjective: 44 year old female presents the office they for follow-up evaluation of right heel pain and left foot arch pain. She states the left foot pain as well as the same however she has worsening pain in her right foot. She denies any recent injury, to the area and she denies any swelling or redness over the area. She was unable to get the arch supports due to cost. She states that she particularly has pain in her right heel upon prolonged weightbearing and standing. She also reports that since last appointment she has started to note some discoloration to her digits and she is concerned about circulation. She states that she does have some cramping when walking to her calves. She does not have much pain at night which wakes her up. She denies any swelling to the capsule any redness or warmth. No other complaints at this time.  Objective: AAO 3, NAD DP/PT pulses palpable, CRT less than 3 seconds Protective sensation intact with Simms Weinstein monofilament, vibratory sensation intact, Achilles tendon reflex intact. There is tenderness to palpation over the plantar aspect of the calcaneus on the right foot along the insertion of the plantar fascia. There is atrophy of the fat pad within the heel. There is no pain with lateral compression of the calcaneus or pain with vibratory sensation. There is no pain along the course of plantar fascial in the arch of the foot and the plantar fascia appears to be intact. There is no pain along the posterior aspect of the calcaneus or along the course/insertion of the Achilles tendon. On the left foot there is mild pain along the medial arch however there is no specific area pinpoint bony tenderness or pain the vibratory sensation. There is no overlying edema, erythema, increase in warmth bilaterally. There is a decrease in medial arch height upon weightbearing with equinus. There is  slight discoloration to the hallux bilaterally with a left greater than right. The toe cervical warmth. This issue was new and was not present last appointment.  No pain with calf compression, swelling, warmth, erythema. No open lesions or pre-ulcer lesions identified bilaterally.  Assessment: 44 year old female right heel pain, left arch pain; presents with new discoloration to bilateral hallux  Plan: -Treatment options were discussed with the patient lying alternatives, risks, complications. - Due to the discoloration of the digits and  She has some symptoms of claudication will obtain vascular studies. She does have a history of DVT.  -Discussed possible steroid injection into the right heel however patient wishes to hold off. Also due to atrophy of the fat pad would hold off on steroid injection. As she is unable to the  Custom orthotics I discussed with her over-the-counter. Also discussed their gel heel cup to help padding to the right heel. Overlying DP she would benefit from orthotics due to her flat foot. - Continue with stretching icing activities. - Follow-up after vascular studies or sooner should any problems arise. In the meantime, encouraged to call the office with any questions, concerns, change in symptoms.

## 2014-07-12 ENCOUNTER — Ambulatory Visit (HOSPITAL_COMMUNITY)
Admission: RE | Admit: 2014-07-12 | Discharge: 2014-07-12 | Disposition: A | Discharge status: Home / Self Care | Payer: Medicare Other | Source: Ambulatory Visit | Attending: Radiology | Admitting: Radiology

## 2014-07-12 DIAGNOSIS — R0989 Other specified symptoms and signs involving the circulatory and respiratory systems: Secondary | ICD-10-CM | POA: Insufficient documentation

## 2014-07-12 DIAGNOSIS — Z9889 Other specified postprocedural states: Secondary | ICD-10-CM | POA: Diagnosis not present

## 2014-07-12 NOTE — Progress Notes (Signed)
Arterial Duplex Lower Ext. Completed. Normal Duplex imaging of the lower extremities and normal ABIs bilaterally.  Oda Cogan, BS, RDMS, RVT

## 2014-07-16 DIAGNOSIS — M47816 Spondylosis without myelopathy or radiculopathy, lumbar region: Secondary | ICD-10-CM | POA: Diagnosis not present

## 2014-07-16 DIAGNOSIS — Z96652 Presence of left artificial knee joint: Secondary | ICD-10-CM | POA: Diagnosis not present

## 2014-07-16 DIAGNOSIS — M545 Low back pain: Secondary | ICD-10-CM | POA: Diagnosis not present

## 2014-07-17 ENCOUNTER — Telehealth: Payer: Self-pay

## 2014-07-17 NOTE — Telephone Encounter (Signed)
Spoke with pt regarding doppler results, advised her that blood flow was good to her foot but mildly abnormal to her toe. She admitted that toe has been slightly discolored and has had a mild tingling sensation in it for atleast 2 weeks now. Informed pt that I would be back in touch with her if she needed follow up or further evaluation

## 2014-07-17 NOTE — Telephone Encounter (Signed)
Can you schedule her to come in sooner? The toe was discolored slightly at last appointment. If it is getting worse she should come in or go to the ED. Thanks.

## 2014-07-18 ENCOUNTER — Ambulatory Visit (INDEPENDENT_AMBULATORY_CARE_PROVIDER_SITE_OTHER): Payer: Medicare Other | Admitting: Family Medicine

## 2014-07-18 ENCOUNTER — Encounter: Payer: Self-pay | Admitting: Family Medicine

## 2014-07-18 VITALS — BP 124/53 | HR 76 | Ht 67.0 in | Wt 317.6 lb

## 2014-07-18 DIAGNOSIS — G8929 Other chronic pain: Secondary | ICD-10-CM | POA: Diagnosis not present

## 2014-07-18 DIAGNOSIS — F3170 Bipolar disorder, currently in remission, most recent episode unspecified: Secondary | ICD-10-CM

## 2014-07-18 MED ORDER — LAMOTRIGINE 100 MG PO TABS: 100.0000 mg | ORAL_TABLET | Freq: Every day | ORAL | Status: DC

## 2014-07-18 MED ORDER — HYDROCODONE-ACETAMINOPHEN 7.5-500 MG PO TABS: 1.0000 | ORAL_TABLET | Freq: Three times a day (TID) | ORAL | Status: DC | PRN

## 2014-07-18 MED ORDER — OXYCODONE-ACETAMINOPHEN 7.5-325 MG PO TABS: ORAL_TABLET | ORAL | Status: DC

## 2014-07-18 MED ORDER — RISPERIDONE 1 MG PO TABS
1.0000 mg | ORAL_TABLET | Freq: Every day | ORAL | Status: DC
Start: 2014-07-18 — End: 2015-08-07

## 2014-07-20 NOTE — Assessment & Plan Note (Signed)
Long discussions pinker than 50% of our 30 minute office visit in counseling education regarding pain management. I will try to get her set up with the pain clinic for chronic opioid pain medicine management. In the interim I will go back to the previous medication regimen with Percocet. I'll see her back in one month.

## 2014-07-20 NOTE — Progress Notes (Signed)
   Subjective:    Patient ID: Crystal Garza, female    DOB: 12-10-1970, 44 y.o.   MRN: [id number]  HPI Here she discuss her pain management. She really didn't think the tramadol did anything for her knee pain it seemed to give her intermittent headache. Without the narcotic medications she was unable to do much walking was having difficulty completing her 8 hour day at work. She would really like to go back to the previous medication regimen.   Review of Systems No unusual weight change, fever, sweats, chills.    Objective:   Physical Exam Vital signs reviewed. GENERAL: Well-developed, well-nourished, no acute distress. CARDIOVASCULAR: Regular rate and rhythm no murmur gallop or rub LUNGS: Clear to auscultation bilaterally, no rales or wheeze. ABDOMEN: Soft positive bowel sounds NEURO: No gross focal neurological deficits. MSK: Movement of extremity x 4. Bilateral knee pain medial joint line. Soft tissue changes consistent with chronic synovitis. Extension is normal bilaterally. Flexion is normal on the right. Flexion to about 1:30 on the left.         Assessment & Plan:

## 2014-07-20 NOTE — Assessment & Plan Note (Signed)
She's evidently been out of her psychiatric medicines for several months now. They were prescribed by her psychiatrist's and she does some money at their office and they will see her until she pays off the balance. I will give her several months of the respiratory down and Depakote which are both very low dose. Ultimately I plan for her to get back into psychiatric care. I will not become for writing her benzodiazepines particular areas we are placing her back on narcotics.

## 2014-07-24 DIAGNOSIS — M47816 Spondylosis without myelopathy or radiculopathy, lumbar region: Secondary | ICD-10-CM | POA: Diagnosis not present

## 2014-07-24 DIAGNOSIS — M545 Low back pain: Secondary | ICD-10-CM | POA: Diagnosis not present

## 2014-07-24 DIAGNOSIS — M542 Cervicalgia: Secondary | ICD-10-CM | POA: Diagnosis not present

## 2014-08-01 DIAGNOSIS — M545 Low back pain: Secondary | ICD-10-CM | POA: Diagnosis not present

## 2014-08-01 DIAGNOSIS — M47816 Spondylosis without myelopathy or radiculopathy, lumbar region: Secondary | ICD-10-CM | POA: Diagnosis not present

## 2014-08-09 DIAGNOSIS — Z6841 Body Mass Index (BMI) 40.0 and over, adult: Secondary | ICD-10-CM | POA: Diagnosis not present

## 2014-08-09 DIAGNOSIS — Z9884 Bariatric surgery status: Secondary | ICD-10-CM | POA: Diagnosis not present

## 2014-08-10 ENCOUNTER — Encounter: Payer: Self-pay | Admitting: Podiatry

## 2014-08-10 ENCOUNTER — Ambulatory Visit (INDEPENDENT_AMBULATORY_CARE_PROVIDER_SITE_OTHER): Payer: Medicare Other | Admitting: Podiatry

## 2014-08-10 VITALS — BP 125/63 | HR 89 | Resp 18

## 2014-08-10 DIAGNOSIS — M722 Plantar fascial fibromatosis: Secondary | ICD-10-CM

## 2014-08-10 MED ORDER — TRIAMCINOLONE ACETONIDE 10 MG/ML IJ SUSP
10.0000 mg | Freq: Once | INTRAMUSCULAR | Status: AC
Start: 2014-08-10 — End: 2014-08-10
  Administered 2014-08-10: 10 mg

## 2014-08-13 ENCOUNTER — Other Ambulatory Visit: Payer: Self-pay | Admitting: Surgery

## 2014-08-13 NOTE — Progress Notes (Signed)
Patient ID: Crystal Garza, female   DOB: [birth year], 44 y.o.   MRN: [id number]  Subjective: 44 year old female returns the office for follow-up evaluation of right foot heel pain as well as a discussed vascular results the left foot. She states that she continues or pain particularly in the morning or after periods of inactivity to the right heel which is relieved somewhat by ambulation. Denies any recent redness or swelling to the area. Denies any recent injury or trauma. States that overall she does have some numbness to both her feet. Denies any systemic complaints such as fevers, chills, nausea, vomiting. No acute changes since last appointment, and no other complaints at this time.   Objective: AAO x3, NAD DP/PT pulses palpable bilaterally, CRT less than 3 seconds; there is uniform skin color to all digits.  Protective sensation intact with Derrel Nip monofilament, vibratory sensation intact, Achilles tendon reflex intact, there is some subjective numbness. Negative tinel sign b/l.  On the plantar medial aspect of the right calcaneus only insertion of plantar fascial there is some tenderness to palpation. There is no pain along the course of the plantar fascia within the arch of the foot in the ligament appears to be intact. No pain with lateral compression of the calcaneus or pain with vibratory sensation of the right side or bilaterally. There is a significant decrease in medial arch height upon weightbearing bilaterally. There is equinus present. No other areas of tenderness to bilateral lower extremities.  No  otherareas of pinpoint bony tenderness or pain with vibratory sensation. MMT 5/5, ROM WNL. No edema, erythema, increase in warmth to bilateral lower extremities.  No open lesions or pre-ulcerative lesions.  No pain with calf compression, swelling, warmth, erythema  Assessment:  44 year old female with right heel pain, likely plantar fasciitis, flat foot.  Plan: -All treatment  options discussed with the patient including all alternatives, risks, complications.  -Patient elects to proceed with steroid injection into the right heel. Under sterile skin preparation, a total of 2.5cc of kenalog 10, 0.5% Marcaine plain, and 2% lidocaine plain were infiltrated into the symptomatic area without complication. A band-aid was applied. Patient tolerated the injection well without complication. Post-injection care with discussed with the patient. Discussed with the patient to ice the area over the next couple of days to help prevent a steroid flare.  -Discussed stretching exercises -Discussed shoe gear modifications and possible orthotics to help control her foot type. -Follow-up in 4 weeks or sooner if any problems are to arise. -Discussed vascular results. -Patient encouraged to call the office with any questions, concerns, change in symptoms.

## 2014-08-20 DIAGNOSIS — R312 Other microscopic hematuria: Secondary | ICD-10-CM | POA: Diagnosis not present

## 2014-08-20 DIAGNOSIS — R3915 Urgency of urination: Secondary | ICD-10-CM | POA: Diagnosis not present

## 2014-08-22 ENCOUNTER — Encounter: Payer: Self-pay | Admitting: Family Medicine

## 2014-08-22 ENCOUNTER — Ambulatory Visit (INDEPENDENT_AMBULATORY_CARE_PROVIDER_SITE_OTHER): Payer: Medicare Other | Admitting: Family Medicine

## 2014-08-22 VITALS — BP 104/67 | HR 83 | Temp 98.2°F | Ht 67.0 in | Wt 325.0 lb

## 2014-08-22 DIAGNOSIS — G43909 Migraine, unspecified, not intractable, without status migrainosus: Secondary | ICD-10-CM

## 2014-08-22 DIAGNOSIS — F319 Bipolar disorder, unspecified: Secondary | ICD-10-CM | POA: Diagnosis not present

## 2014-08-22 DIAGNOSIS — R319 Hematuria, unspecified: Secondary | ICD-10-CM | POA: Insufficient documentation

## 2014-08-22 DIAGNOSIS — IMO0002 Reserved for concepts with insufficient information to code with codable children: Secondary | ICD-10-CM

## 2014-08-22 DIAGNOSIS — Z79899 Other long term (current) drug therapy: Secondary | ICD-10-CM | POA: Insufficient documentation

## 2014-08-22 DIAGNOSIS — M179 Osteoarthritis of knee, unspecified: Secondary | ICD-10-CM

## 2014-08-22 DIAGNOSIS — F3170 Bipolar disorder, currently in remission, most recent episode unspecified: Secondary | ICD-10-CM | POA: Diagnosis not present

## 2014-08-22 DIAGNOSIS — M1710 Unilateral primary osteoarthritis, unspecified knee: Secondary | ICD-10-CM

## 2014-08-22 DIAGNOSIS — G8929 Other chronic pain: Secondary | ICD-10-CM | POA: Diagnosis not present

## 2014-08-22 LAB — COMPREHENSIVE METABOLIC PANEL
ALT: 10 U/L (ref 0–35)
AST: 16 U/L (ref 0–37)
Albumin: 3.4 g/dL — ABNORMAL LOW (ref 3.5–5.2)
Alkaline Phosphatase: 58 U/L (ref 39–117)
BUN: 12 mg/dL (ref 6–23)
CO2: 23 mEq/L (ref 19–32)
Calcium: 9.2 mg/dL (ref 8.4–10.5)
Chloride: 103 mEq/L (ref 96–112)
Creat: 0.76 mg/dL (ref 0.50–1.10)
Glucose, Bld: 71 mg/dL (ref 70–99)
Potassium: 4.4 mEq/L (ref 3.5–5.3)
Sodium: 138 mEq/L (ref 135–145)
Total Bilirubin: 0.4 mg/dL (ref 0.2–1.2)
Total Protein: 6.9 g/dL (ref 6.0–8.3)

## 2014-08-22 LAB — CBC WITH DIFFERENTIAL/PLATELET
Basophils Absolute: 0 10*3/uL (ref 0.0–0.1)
Basophils Relative: 0 % (ref 0–1)
Eosinophils Absolute: 0.1 10*3/uL (ref 0.0–0.7)
Eosinophils Relative: 1 % (ref 0–5)
HCT: 38.9 % (ref 36.0–46.0)
Hemoglobin: 12.7 g/dL (ref 12.0–15.0)
Lymphocytes Relative: 30 % (ref 12–46)
Lymphs Abs: 3.2 10*3/uL (ref 0.7–4.0)
MCH: 27.3 pg (ref 26.0–34.0)
MCHC: 32.6 g/dL (ref 30.0–36.0)
MCV: 83.5 fL (ref 78.0–100.0)
MPV: 9.5 fL (ref 8.6–12.4)
Monocytes Absolute: 0.8 10*3/uL (ref 0.1–1.0)
Monocytes Relative: 8 % (ref 3–12)
Neutro Abs: 6.4 10*3/uL (ref 1.7–7.7)
Neutrophils Relative %: 61 % (ref 43–77)
Platelets: 471 10*3/uL — ABNORMAL HIGH (ref 150–400)
RBC: 4.66 MIL/uL (ref 3.87–5.11)
RDW: 15.5 % (ref 11.5–15.5)
WBC: 10.5 10*3/uL (ref 4.0–10.5)

## 2014-08-22 MED ORDER — OXYCODONE-ACETAMINOPHEN 7.5-325 MG PO TABS: ORAL_TABLET | ORAL | Status: DC

## 2014-08-22 NOTE — Assessment & Plan Note (Signed)
I had given her 2 months prescription for her risperidone as she had run out and was having some financial did difficulties getting back to see her psychiatrist. She has now gotten an appointment to see him. We'll go ahead and get some blood work today for her to take with her to his appointment.

## 2014-08-22 NOTE — Assessment & Plan Note (Signed)
History of chronic migraines. Sounds like they've been a little worse recently and that certainly could be because she was off the risperidone for a while. If they get worse or she has some new unusual symptoms with them she'll follow-up sooner otherwise we'll revisit this when I see her back in a month.

## 2014-08-22 NOTE — Progress Notes (Signed)
   Subjective:    Patient ID: Crystal Garza, female    DOB: 17-Aug-1970, 44 y.o.   MRN: [id number]  HPI  #1. Follow-up chronic pain. I had placed it referral for pain clinic but somehow that never went through. We had return her to her previous pain medicine regimen last month and she says that works much better for her area she's not having any problems with the medicines. Does not feel like she can try to taper that down at all. She is very amenable to going to the pain clinic. #2. Recently noticed some blood in her urine so she went to see the urologist. They plan to do a cystoscopy in the next couple of weeks. They also noted her platelets were elevated she has some questions about that. #3. Follow-up bipolar disorder: She is family gotten the finances straightened out with Dr. Dayle Points office and we'll be seeing him the next month. She would like to go ahead and get some blood work because he sure they'll want that to follow her risperidone. She is grateful be back on risperidone seems it really does help. #4. Having some increase in her migraines and she thinks that was related to her discontinuation of her bipolar medicine for that brief interval.  Review of Systems Migraine headaches are typical for her without any new symptoms. They're intermittent. See history of present illness above for additional pertinent review of systems.    Objective:   Physical Exam  Vital signs reviewed. GENERAL: Well-developed, well-nourished, no acute distress. Obese CARDIOVASCULAR: Regular rate and rhythm no murmur gallop or rub LUNGS: Clear to auscultation bilaterally, no rales or wheeze. ABDOMEN: Soft positive bowel sounds NEURO: No gross focal neurological deficits. MSK: Movement of extremity x 4. Left knee extension seems full as does flexion. PSYCHIATRIC: Alert and oriented 4. Affect is interactive. Neatly dressed. Good eye contact. No psychomotor retardation or agitation.          Assessment & Plan:

## 2014-08-22 NOTE — Assessment & Plan Note (Signed)
Referral placed for pain clinic

## 2014-08-29 ENCOUNTER — Encounter: Payer: Self-pay | Admitting: Family Medicine

## 2014-08-29 DIAGNOSIS — D75839 Thrombocytosis, unspecified: Secondary | ICD-10-CM | POA: Insufficient documentation

## 2014-08-29 DIAGNOSIS — D473 Essential (hemorrhagic) thrombocythemia: Secondary | ICD-10-CM | POA: Insufficient documentation

## 2014-08-31 ENCOUNTER — Telehealth: Payer: Self-pay | Admitting: Family Medicine

## 2014-08-31 DIAGNOSIS — D75839 Thrombocytosis, unspecified: Secondary | ICD-10-CM

## 2014-08-31 DIAGNOSIS — D473 Essential (hemorrhagic) thrombocythemia: Secondary | ICD-10-CM

## 2014-08-31 NOTE — Telephone Encounter (Signed)
[name] is requesting a referral to a hematologist.  Was informed by provider in My chart that she needed to see someone.  Don't know who to call.

## 2014-08-31 NOTE — Telephone Encounter (Signed)
Dear [name] Team [place] I think I know what she is talking about--her platelets were high and I sent her a letter. Please let her know I wil lput in a referral--if she has not heard from them in 2 weeks let me know THANKS! [name]

## 2014-09-03 NOTE — H&P (Signed)
Crystal Garza 08/09/2014 9:32 AM Location: [facility] Patient #: [id number] DOB: [birth year] Single / Language: Crystal Garza / Race: Black or African American Female  History of Present Illness:  The patient is a 44 year old female presenting status-post bariatric surgery. Crystal Garza is a 44 year old patient of Dr. Hassell Done who received a sleeve gastrectomy in April 2014  (08/29/2012).  At the time of surgery, her weight was 370 and BMI 58.1.   She has lost a total of 52 lbs since surgery but has gained 12 lbs since her last visit with Crystal Garza in December.   She comes in today with a complaint of abdominal burning after eating meals. She is taking omeprazole daily but the pain continues despite this. She was taking NSAIDS previously but has discontinued this. She is on xarelto for DVT now but is taking percocet for musculoskeletal pain. She had a change in one of her psychotropic medications about 2 months ago and has noticed that her weight has gone up since then. She was down 6 pounds prior to this. She has no melena or hematochezia. No fever.  Past Medical History: 1.  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 2.  Had left total knee by Dr. Ninfa Linden - 10/06/2013 3.  History of chronic migraines 4.  History of bipolar disease - Sees Dr. Candis Schatz 5.  Chronic pain issues and meds 6.  Seen at [facility] for foot pain 7.  Seen at [facility] by Dr. [name].   Medication History Crystal Garza, RMA; 08/09/2014 9:33 AM) Medications Reconciled  Vitals Crystal Malta Witty RMA; 08/09/2014 9:33 AM) 08/09/2014 9:33 AM Weight: 322.02 lb Height: 67in Body Surface Area: 2.63 m Body Mass Index: 50.43 kg/m Temp.: 98.69F(Oral)  Pulse: 76 (Regular)  Resp.: 15 (Unlabored)  BP: 132/84 (Sitting, Left Arm, Standard)  Physical Exam  General Mental Status-Alert. General Appearance-Consistent with stated age. Hydration-Well hydrated. Voice-Normal.  Chest and Lung Exam Chest  and lung exam reveals -quiet, even and easy respiratory effort with no use of accessory muscles. Inspection Chest Wall - Normal. Back - normal.  Abdomen Inspection Skin - Scar - Note: Incision: clean, dry, and intact. Palpation/Percussion Palpation and Percussion of the abdomen reveal - Soft, Non Tender, No Rebound tenderness, No Rigidity (guarding) and No hepatosplenomegaly. Note: Keloid scarring to port incisions.  Assessment & Plan:  1.  MORBID OBESITY WITH BMI OF 50.0-59.9, ADULT (278.01  E66.01) S/P LAPAROSCOPIC SLEEVE GASTRECTOMY (V45.86  Z98.84)  Impression: I'm concerned about her post-parandial abdominal pain. I will Rx protonix to see if this helps somewhat but she may need evaluation for EGD given her previous NSAID use. We will work to get her lab results sent to her PCP. Otherwise back in six months. She is going to consult with her mental health professional to consider changing her psychotropic medications as she attributes this to her weight gain.  Current Plans  Follow up in 6 months or as needed  Instructions: We will contact you regarding your endoscopy. Discontinue omeprazole and begin taking protonix. Call Crystal Garza should you have worsening pain.  Started Protonix 40MG , 1 (one) Tablet DR daily, #30, 08/09/2014, Ref. X11.  2.  There is a question if the patient is on Xarelto (I do not see this on our face sheet)  3.  History of chronic migraines 4.  History of bipolar disease - Sees Dr. Candis Schatz 5.  Chronic pain issues and meds 6.  Seen at [facility] for foot  pain  Note by Raylene Miyamoto PA C   Alphonsa Overall, MD, [facility] Pager: [phone] Office phone:  [phone]

## 2014-09-04 ENCOUNTER — Ambulatory Visit (HOSPITAL_COMMUNITY)
Admission: RE | Admit: 2014-09-04 | Discharge: 2014-09-04 | Disposition: A | Discharge status: Home / Self Care | Payer: Medicare Other | Source: Ambulatory Visit | Attending: Surgery | Admitting: Surgery

## 2014-09-04 ENCOUNTER — Encounter (HOSPITAL_COMMUNITY)
Admission: RE | Disposition: A | Discharge status: Home / Self Care | Payer: Self-pay | Source: Ambulatory Visit | Attending: Surgery

## 2014-09-04 ENCOUNTER — Encounter (HOSPITAL_COMMUNITY): Payer: Self-pay | Admitting: *Deleted

## 2014-09-04 DIAGNOSIS — Z9884 Bariatric surgery status: Secondary | ICD-10-CM | POA: Insufficient documentation

## 2014-09-04 DIAGNOSIS — Z79891 Long term (current) use of opiate analgesic: Secondary | ICD-10-CM | POA: Diagnosis not present

## 2014-09-04 DIAGNOSIS — Z7901 Long term (current) use of anticoagulants: Secondary | ICD-10-CM | POA: Diagnosis not present

## 2014-09-04 DIAGNOSIS — Z86718 Personal history of other venous thrombosis and embolism: Secondary | ICD-10-CM | POA: Insufficient documentation

## 2014-09-04 DIAGNOSIS — R109 Unspecified abdominal pain: Secondary | ICD-10-CM | POA: Insufficient documentation

## 2014-09-04 DIAGNOSIS — M79673 Pain in unspecified foot: Secondary | ICD-10-CM | POA: Insufficient documentation

## 2014-09-04 DIAGNOSIS — G8929 Other chronic pain: Secondary | ICD-10-CM | POA: Diagnosis not present

## 2014-09-04 DIAGNOSIS — Z6841 Body Mass Index (BMI) 40.0 and over, adult: Secondary | ICD-10-CM | POA: Diagnosis not present

## 2014-09-04 DIAGNOSIS — F319 Bipolar disorder, unspecified: Secondary | ICD-10-CM | POA: Diagnosis not present

## 2014-09-04 DIAGNOSIS — G43909 Migraine, unspecified, not intractable, without status migrainosus: Secondary | ICD-10-CM | POA: Insufficient documentation

## 2014-09-04 DIAGNOSIS — K219 Gastro-esophageal reflux disease without esophagitis: Secondary | ICD-10-CM | POA: Diagnosis not present

## 2014-09-04 DIAGNOSIS — Z79899 Other long term (current) drug therapy: Secondary | ICD-10-CM | POA: Diagnosis not present

## 2014-09-04 HISTORY — PX: ESOPHAGOGASTRODUODENOSCOPY: SHX5428

## 2014-09-04 SURGERY — EGD (ESOPHAGOGASTRODUODENOSCOPY)
Anesthesia: Moderate Sedation

## 2014-09-04 MED ORDER — BUTAMBEN-TETRACAINE-BENZOCAINE 2-2-14 % EX AERO
INHALATION_SPRAY | CUTANEOUS | Status: DC | PRN
  Administered 2014-09-04: 3 via TOPICAL

## 2014-09-04 MED ORDER — FENTANYL CITRATE (PF) 100 MCG/2ML IJ SOLN
INTRAMUSCULAR | Status: DC | PRN
  Administered 2014-09-04 (×2): 25 ug via INTRAVENOUS

## 2014-09-04 MED ORDER — SODIUM CHLORIDE 0.9 % IV SOLN
INTRAVENOUS | Status: DC
  Administered 2014-09-04: 500 mL via INTRAVENOUS

## 2014-09-04 MED ORDER — FENTANYL CITRATE (PF) 100 MCG/2ML IJ SOLN
INTRAMUSCULAR | Status: AC
  Filled 2014-09-04: qty 2

## 2014-09-04 MED ORDER — MIDAZOLAM HCL 10 MG/2ML IJ SOLN
INTRAMUSCULAR | Status: DC | PRN
  Administered 2014-09-04 (×2): 2 mg via INTRAVENOUS

## 2014-09-04 MED ORDER — MIDAZOLAM HCL 10 MG/2ML IJ SOLN
INTRAMUSCULAR | Status: AC
  Filled 2014-09-04: qty 2

## 2014-09-04 MED ORDER — DIPHENHYDRAMINE HCL 50 MG/ML IJ SOLN
INTRAMUSCULAR | Status: AC
  Filled 2014-09-04: qty 1

## 2014-09-04 NOTE — Interval H&P Note (Signed)
History and Physical Interval Note:  09/04/2014 1:29 PM  Crystal Garza  has presented today for surgery, with the diagnosis of history of sleeve gatrectomy, abdominal paio  The various methods of treatment have been discussed with the patient and family.  She has not stopped her Xarelto.  So this will limit what we can do.  She still wants to go ahead with the procedure.  Her last food was 7 AM - about 6 hours ago.  I think it will be okay to go ahead - she should have a small gastric pouch.  Her daughter has come with her.  She left to run an errand.  After consideration of risks, benefits and other options for treatment, the patient has consented to  Procedure(s): ESOPHAGOGASTRODUODENOSCOPY (EGD) (N/A) as a surgical intervention .  The patient's history has been reviewed, patient examined, no change in status, stable for surgery.  I have reviewed the patient's chart and labs.  Questions were answered to the patient's satisfaction.     [name] H

## 2014-09-04 NOTE — Op Note (Signed)
09/04/2014  1:53 PM  PATIENT:  Crystal Garza, 44 y.o., female, MRN: [id number]  PREOP DIAGNOSIS:  Abdominal burning after gastric sleeve  POSTOP DIAGNOSIS:   Normal gastric sleeve.  No evidence of ulcer or stricture.  No explanation for her symptoms.  PROCEDURE:  Esophagogastroduodenoscopy  SURGEON:   Alphonsa Overall, M.D.  ANESTHESIA:   Fentanyl  50 mcg   Versed 4 mg  INDICATIONS FOR PROCEDURE:  Crystal Garza is a 44 y.o. (DOB: 06-07-1970)  AA  female whose primary care physician is [name], MD and comes for upper endoscopy to evaluate her gastric sleeve.  She has had some symptoms of burning after eating meals.  The patient had a gastric sleeve on 08/29/2012 by Dr. Hassell Done.   The indications and risks of the endoscopy were explained to the patient.  The risks include, but are not limited to, perforation, bleeding, or injury to the bowel.  If balloon dilatation is needed, the risk of perforation is higher.  PROCEDURE:  The patient was in room 3 at [facility] endoscopy unit.  The patient was monitored with a pulse oximetry, BP cuff, and EKG.  The patient has nasal O2 flowing during the procedure.   The back of the throat was anesthestized with Ceticaine x 3.  The patient was positioned in the left lateral decubitus position.  The patient was given Fentanyl and Versed.  A flexible Pentax endoscope was passed down the throat without difficulty.   Findings include:   Esophagus:   Normal   GE junction at:  36 cm   Stomach pouch: No ulcer or stenosis.  Actually, maybe a little on the large diameter side   Distal staple line of sleeve:   I could nto see an obvious distal staple line.  She is far enough out from surgery that this is hard to delineate.   Pylorus distance:  50 cm   Duodenum:  Normal   CLO test:  Not done.  The patient is on Xarelto and I had concern about bleeding.  PLAN:   Photos taken and given to patient.    Follow up in 6 - 8 weeks with Dr. Hassell Done.  Alphonsa Overall, MD,  [facility] Pager: [phone] Office phone:  [phone]

## 2014-09-04 NOTE — Discharge Instructions (Signed)
Esophagogastroduodenoscopy °Care After °Refer to this sheet in the next few weeks. These instructions provide you with information on caring for yourself after your procedure. Your caregiver may also give you more specific instructions. Your treatment has been planned according to current medical practices, but problems sometimes occur. Call your caregiver if you have any problems or questions after your procedure.  °HOME CARE INSTRUCTIONS °· Do not eat or drink anything until the numbing medicine (local anesthetic) has worn off and your gag reflex has returned. You will know that the local anesthetic has worn off when you can swallow comfortably. °· Do not drive for 12 hours after the procedure or as directed by your caregiver. °· Only take medicines as directed by your caregiver. °SEEK MEDICAL CARE IF:  °· You cannot stop coughing. °· You are not urinating at all or less than usual. °SEEK IMMEDIATE MEDICAL CARE IF: °· You have difficulty swallowing. °· You cannot eat or drink. °· You have worsening throat or chest pain. °· You have dizziness, lightheadedness, or you faint. °· You have nausea or vomiting. °· You have chills. °· You have a fever. °· You have severe abdominal pain. °· You have black, tarry, or bloody stools. °Document Released: 04/06/2012 Document Reviewed: 04/06/2012 °ExitCare® Patient Information ©2015 [employer]. This information is not intended to replace advice given to you by your health care provider. Make sure you discuss any questions you have with your health care provider. ° ° °Conscious Sedation, Adult, Care After °Refer to this sheet in the next few weeks. These instructions provide you with information on caring for yourself after your procedure. Your health care provider may also give you more specific instructions. Your treatment has been planned according to current medical practices, but problems sometimes occur. Call your health care provider if you have any problems or  questions after your procedure. °WHAT TO EXPECT AFTER THE PROCEDURE  °After your procedure: °· You may feel sleepy, clumsy, and have poor balance for several hours. °· Vomiting may occur if you eat too soon after the procedure. °HOME CARE INSTRUCTIONS °· Do not participate in any activities where you could become injured for at least 24 hours. Do not: °¨ Drive. °¨ Swim. °¨ Ride a bicycle. °¨ Operate heavy machinery. °¨ Cook. °¨ Use power tools. °¨ Climb ladders. °¨ Work from a high place. °· Do not make important decisions or sign legal documents until you are improved. °· If you vomit, drink water, juice, or soup when you can drink without vomiting. Make sure you have little or no nausea before eating solid foods. °· Only take over-the-counter or prescription medicines for pain, discomfort, or fever as directed by your health care provider. °· Make sure you and your family fully understand everything about the medicines given to you, including what side effects may occur. °· You should not drink alcohol, take sleeping pills, or take medicines that cause drowsiness for at least 24 hours. °· If you smoke, do not smoke without supervision. °· If you are feeling better, you may resume normal activities 24 hours after you were sedated. °· Keep all appointments with your health care provider. °SEEK MEDICAL CARE IF: °· Your skin is pale or bluish in color. °· You continue to feel nauseous or vomit. °· Your pain is getting worse and is not helped by medicine. °· You have bleeding or swelling. °· You are still sleepy or feeling clumsy after 24 hours. °SEEK IMMEDIATE MEDICAL CARE IF: °· You develop a   rash. °· You have difficulty breathing. °· You develop any type of allergic problem. °· You have a fever. °MAKE SURE YOU: °· Understand these instructions. °· Will watch your condition. °· Will get help right away if you are not doing well or get worse. °Document Released: 02/08/2013 Document Reviewed: 02/08/2013 °ExitCare®  Patient Information ©2015 [employer]. This information is not intended to replace advice given to you by your health care provider. Make sure you discuss any questions you have with your health care provider. ° °

## 2014-09-05 ENCOUNTER — Encounter (HOSPITAL_COMMUNITY): Payer: Self-pay | Admitting: Surgery

## 2014-09-06 ENCOUNTER — Telehealth: Payer: Self-pay | Admitting: Oncology

## 2014-09-06 ENCOUNTER — Encounter: Payer: Self-pay | Admitting: Gastroenterology

## 2014-09-06 ENCOUNTER — Telehealth: Payer: Self-pay | Admitting: Family Medicine

## 2014-09-06 NOTE — Telephone Encounter (Signed)
phyciatrist pt was [facility] and will not accept her insurance Pt has [employer] and [employer]. Wanted to know who she could see? Dr Gwenlyn Saran? Please advise

## 2014-09-06 NOTE — Telephone Encounter (Signed)
Called pt and left message to schedule new patient appt.    Shadad 10/10/14 10:30 am  Referring: Dr. Nori Riis

## 2014-09-07 ENCOUNTER — Encounter: Payer: Self-pay | Admitting: Podiatry

## 2014-09-07 ENCOUNTER — Ambulatory Visit (INDEPENDENT_AMBULATORY_CARE_PROVIDER_SITE_OTHER): Payer: Medicare Other | Admitting: Podiatry

## 2014-09-07 VITALS — BP 136/79 | HR 80 | Resp 12

## 2014-09-07 DIAGNOSIS — M722 Plantar fascial fibromatosis: Secondary | ICD-10-CM

## 2014-09-07 DIAGNOSIS — M799 Soft tissue disorder, unspecified: Secondary | ICD-10-CM

## 2014-09-07 DIAGNOSIS — M7989 Other specified soft tissue disorders: Secondary | ICD-10-CM

## 2014-09-07 NOTE — Progress Notes (Signed)
Patient ID: Crystal Garza, female   DOB: 04/07/1971, 44 y.o.   MRN: [id number]  Subjective: Crystal Garza presents to the office today for follow-up evaluation of right plantar fasciitis. She states that since last appointment of plantar fasciitis pain in the pain to the heel of her foot has significantly improved. She states that she tolerated the injection well without any complications. She's been continuing the stretching icing activities and she does not have any pain at this time. She does his his last appointment she has noticed a small bump form on the top of her right foot which is painful with certain shoes. She is unsure of how long its been there and she denies any history of injury or trauma. She denies any overlying swelling or redness.  Denies any systemic complaints such as fevers, chills, nausea, vomiting. No acute changes since last appointment, and no other complaints at this time.   Objective: AAO x3, NAD DP/PT pulses palpable bilaterally, CRT less than 3 seconds Protective sensation intact with Simms Weinstein monofilament, vibratory sensation intact, Achilles tendon reflex intact Currently, there is no tenderness to palpation along the plantar medial tubercle calcaneus at the insertion the plantar fascia on the right foot. There is no tenderness along the course of plantar fascia. There is a decrease in medial arch height upon weightbearing with equinus present. On the dorsal lateral aspect of the right foot there is a small fluid-filled cystic-like mass which is mobile at approximately the level of the metatarsocuneiform joint. There is mild tenderness to palpation gently upon this area. There is no overlying skin change. No areas of pinpoint bony tenderness or pain with vibratory sensation. MMT 5/5, ROM WNL. No edema, erythema, increase in warmth to bilateral lower extremities.  No open lesions or pre-ulcerative lesions.  No pain with calf compression, swelling, warmth,  erythema  Assessment: 44 year old female with resolved right heel pain, plantar fasciitis; right soft tissue mass possible ganglion cyst  Plan: -All treatment options discussed with the patient including all alternatives, risks, complications.  -In regards the plantar fasciitis recommended continuing stretching icing activities as well as she can modifications orthotics. Months for any reoccurrence. -For the mass in the dorsal lateral aspect of the right foot I discussed a steroid injection to hopefully hel  decrease the pain as well as the size of the mass. Risks and, occasions the injection were discussed the patient for which she understood. Under sterile conditions a total of 1 mL mixture of eczema present phosphate and 0.5% Marcaine plain was infiltrated in fair of maximal tenderness into and around the soft tissue mass. Band-Aid was applied. Postinjection care was discussed the patient. Patient tolerated the injection well any complications. -Follow-up as needed.  -Patient encouraged to call the office with any questions, concerns, change in symptoms.

## 2014-09-07 NOTE — Telephone Encounter (Signed)
Called patient.  She answered but was unable to talk.  She said she would call back.

## 2014-09-07 NOTE — Telephone Encounter (Signed)
[facility] psychiatrist that took her insurance left the practice.  They gave her two resources - neither of which took her insurance.  She states her insurance company [employer]) said psychiatrists at [facility] take her insurance.  She hasn't called secondary to time and also thinking the psychiatrists were inpatient only.  Explained about the outpatient portion.  She voiced an understanding.  She asked about a referral to a pain clinic and I encouraged her to follow up with Dr. Nori Riis.    She can call me back if she has difficulty finding a psychiatrist.  She says she will not go back to [facility] but there may be no other resources available.

## 2014-09-19 ENCOUNTER — Ambulatory Visit (INDEPENDENT_AMBULATORY_CARE_PROVIDER_SITE_OTHER): Payer: Medicare Other | Admitting: Family Medicine

## 2014-09-19 ENCOUNTER — Encounter: Payer: Self-pay | Admitting: Family Medicine

## 2014-09-19 VITALS — BP 133/62 | HR 78 | Temp 98.2°F | Ht 67.0 in | Wt 337.0 lb

## 2014-09-19 DIAGNOSIS — Z96652 Presence of left artificial knee joint: Secondary | ICD-10-CM

## 2014-09-19 DIAGNOSIS — C49A2 Gastrointestinal stromal tumor of stomach: Secondary | ICD-10-CM

## 2014-09-19 DIAGNOSIS — D481 Neoplasm of uncertain behavior of connective and other soft tissue: Secondary | ICD-10-CM | POA: Diagnosis not present

## 2014-09-19 DIAGNOSIS — Z79899 Other long term (current) drug therapy: Secondary | ICD-10-CM | POA: Diagnosis not present

## 2014-09-19 DIAGNOSIS — Z7901 Long term (current) use of anticoagulants: Secondary | ICD-10-CM

## 2014-09-19 MED ORDER — OXYCODONE-ACETAMINOPHEN 7.5-325 MG PO TABS: ORAL_TABLET | ORAL | Status: DC

## 2014-09-19 MED ORDER — PANTOPRAZOLE SODIUM 40 MG PO TBEC: 40.0000 mg | DELAYED_RELEASE_TABLET | Freq: Two times a day (BID) | ORAL | Status: DC

## 2014-09-19 NOTE — Assessment & Plan Note (Signed)
Recent endoscopy showed only some mild gastritis. Placed on Protonix. Still having some breakthrough pain so we'll increase that to twice a day for the next month and I'll see her back.

## 2014-09-19 NOTE — Assessment & Plan Note (Signed)
Still occasionally feels like that knee is going to give out. We discussed rehabilitation of the quadriceps muscle. I reassured her the knee joint was very unlikely to dislocate during everyday activities.

## 2014-09-19 NOTE — Assessment & Plan Note (Signed)
I had less a referral for the pain clinic but they've never called her. For now continue her current medications and check into that. She's following with me in a month.

## 2014-09-19 NOTE — Progress Notes (Signed)
   Subjective:    Patient ID: Crystal Garza, female    DOB: [birth year], 44 y.o.   MRN: [id number]  HPI #1. Follow-up endoscopy. She's been having some stomach pain and they put her on Protonix once a day which seemed to help initially but now she's having pain again #2. Occasionally feels like her left knees going to give out. This is the knee where she's had a replacement.   #3 chronic pain management. I have placed a referral for the pain clinic but she has not heard anything from them. She is willing go to the pain clinic. #4. She has been successful in setting up new psychiatric care.   Review of Systems No unusual mood swings, no sleep problems. Knee pain per history of present illness. GERD per history of present illness. No unusual weight change. No fever, sweats, chills.    Objective:   Physical Exam  Vital signs reviewed. GENERAL: Well-developed, well-nourished, no acute distress. CARDIOVASCULAR: Regular rate and rhythm no murmur gallop or rub LUNGS: Clear to auscultation bilaterally, no rales or wheeze. ABDOMEN: Soft positive bowel sounds NEURO: No gross focal neurological deficits. MSK: Movement of extremity x 4. Left knee has full extension and flexion is 110. PSYCHIATRIC: Alert and oriented 4. Interactive. Good eye contact. Neatly dressed. No hallucination. Speech is normal fluency in content.       Assessment & Plan:

## 2014-09-19 NOTE — Assessment & Plan Note (Signed)
We again discussed long-term versus lifelong versus acute only anticoagulation. We have talked about this before and each time we talked through she wants to continue on long-term anticoagulation. I think which she sees another doctor and they asked her why she's on this chronically rather than just for a 3-6 months. After DVT, she gets confused. I think she again understands her options. Certainly she can stop this. I recommendation however giving her underlying comorbidities would be for her to continue.

## 2014-10-03 DIAGNOSIS — B9620 Unspecified Escherichia coli [E. coli] as the cause of diseases classified elsewhere: Secondary | ICD-10-CM | POA: Diagnosis not present

## 2014-10-03 DIAGNOSIS — N390 Urinary tract infection, site not specified: Secondary | ICD-10-CM | POA: Diagnosis not present

## 2014-10-03 DIAGNOSIS — R312 Other microscopic hematuria: Secondary | ICD-10-CM | POA: Diagnosis not present

## 2014-10-03 DIAGNOSIS — R3915 Urgency of urination: Secondary | ICD-10-CM | POA: Diagnosis not present

## 2014-10-04 ENCOUNTER — Telehealth: Payer: Self-pay | Admitting: Family Medicine

## 2014-10-04 NOTE — Telephone Encounter (Signed)
Is at Dr Sabino Donovan that did bariatric surgery Says she needs a referral for her visit in April and June Pt wants to know if she needs a referral for each time she goes to dr Please advise [employer] Medicaid-secondary

## 2014-10-05 NOTE — Telephone Encounter (Signed)
Contacted pt and told her that I had called over to Dr. Earlie Server office to inquire about below message.  The office said that due to these appointments being follow ups to her surgery in December that they didn't need referrals. Told her that if there was anything new that she needed done she would need a referral. Crystal Garza, Jediah Horger D, CMA

## 2014-10-09 ENCOUNTER — Telehealth: Payer: Self-pay | Admitting: *Deleted

## 2014-10-09 NOTE — Telephone Encounter (Signed)
Patient returned call from [facility] with Dr. Alen Blew. Informed her of appointment date and time.  Asked that she bring her medications or a list.  Also asked to arrive 15 minutes early and we have valet parking at no charge.  Asked if she will be finished by 5:00 pm and yes she will with a 60 minute appointment slot she should be finished by 3:30 pm.

## 2014-10-09 NOTE — Telephone Encounter (Signed)
Left message for patient to call me re: new patient appt.

## 2014-10-10 ENCOUNTER — Other Ambulatory Visit: Payer: Medicare Other

## 2014-10-10 ENCOUNTER — Ambulatory Visit: Payer: Medicare Other | Admitting: Oncology

## 2014-10-10 ENCOUNTER — Ambulatory Visit (HOSPITAL_BASED_OUTPATIENT_CLINIC_OR_DEPARTMENT_OTHER): Payer: Medicare Other | Admitting: Oncology

## 2014-10-10 ENCOUNTER — Ambulatory Visit: Payer: Medicare Other

## 2014-10-10 VITALS — BP 130/87 | HR 80 | Temp 98.1°F | Resp 19 | Ht 67.0 in | Wt 334.8 lb

## 2014-10-10 DIAGNOSIS — D473 Essential (hemorrhagic) thrombocythemia: Secondary | ICD-10-CM

## 2014-10-10 DIAGNOSIS — Z86718 Personal history of other venous thrombosis and embolism: Secondary | ICD-10-CM | POA: Diagnosis not present

## 2014-10-10 DIAGNOSIS — Z7901 Long term (current) use of anticoagulants: Secondary | ICD-10-CM

## 2014-10-10 DIAGNOSIS — D75839 Thrombocytosis, unspecified: Secondary | ICD-10-CM

## 2014-10-10 NOTE — Consult Note (Signed)
Reason for Referral: Deep vein thrombosis.   HPI: 44 year old woman currently of [place] where she lived the majority of her life. She is a pleasant woman with history of hypertension, obesity and osteoarthritis. 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 Hassell Done in 2013. She subsequently had a sleeve gastrectomy for her obesity. She tolerated these procedure well have not had any complications. She did have a left knee replacement done on 10/07/2013 but postoperatively developed bilateral lower extremity deep vein thrombosis. Ultrasound Doppler done on 10/08/2013 confirm the presence of deep vein thrombosis involving bilateral posterior tibial veins and left peroneal veins. CT scan of the chest showed small pulmonary embolism as well. She was started on Xarelto and have been on it since that time. She has tolerated it well without any complications. She does not report any bleeding at this time including hemoptysis or hematemesis. She does not report any hematochezia or melanoma. She did have an endoscopy by Dr. Lucia Gaskins on 09/04/2014 and showed no abnormalities. She does have chronic pain syndrome but still able to work part time. She still have morbid obesity and her mobility is limited. She have not had any thrombosis episodes before or since. She has no family history of thrombophilia.  She does report occasional headaches but no blurry vision, syncope or seizures. She does not report any fevers, chills, sweats or weight loss. She does not report any chest pain, palpitation, orthopnea. She does not report any cough, hemoptysis, shortness of breath or wheezing. She does not report a nausea, vomiting, abdominal pain, hematochezia, or dark tarry stools. She does not report any frequency, urgency or hesitancy. She has reported hematuria that has been investigated in the past. She she does report diffuse skeletal pain that has been chronic in nature.   Past Medical History  Diagnosis Date   . Anemia     iron def  . Hypertension   . Morbid obesity   . GERD (gastroesophageal reflux disease)   . Anxiety   . Depression   . Migraine   . Back pain   . Chronic knee pain   . Arthritis     left knee   . Hx of laparoscopic gastric banding   . Anginal pain   . Kidney stones   . DVT (deep venous thrombosis)   . PE (pulmonary embolism)   :  Past Surgical History  Procedure Laterality Date  . Abdominal hysterectomy    . Knee arthroscopy    . Cesarean section  05/1991  . Breath tek h pylori  07/28/2011    Procedure: BREATH TEK H PYLORI;  Surgeon: [name], MD;  Location: [facility];  Service: General;  Laterality: N/A;  to be done at 745  . Esophageal biopsy  02/16/2012    Procedure: BIOPSY;  Surgeon: [name], MD;  Location: [facility];  Service: General;;  biopsy of mass x 2  . Eus  03/03/2012    Procedure: UPPER ENDOSCOPIC ULTRASOUND (EUS) LINEAR;  Surgeon: Milus Banister, MD;  Location: [facility];  Service: Endoscopy;  Laterality: N/A;  . Tubal ligation    . Laparoscopic gastrectomy  04/08/2012    Procedure: LAPAROSCOPIC GASTRECTOMY;  Surgeon: [name], MD;  Location: [facility];  Service: General;;  removal of GIST tumor of stomach  . Laparoscopic gastric sleeve resection N/A 08/29/2012    Procedure: LAPAROSCOPIC GASTRIC SLEEVE RESECTION;  Surgeon: [name], MD;  Location: [facility];  Service: General;  Laterality: N/A;  Sleeve Gastrectomy  . Esophagogastroduodenoscopy N/A 08/29/2012    Procedure: ESOPHAGOGASTRODUODENOSCOPY (EGD);  Surgeon: [name], MD;  Location: [facility];  Service: General;  Laterality: N/A;  . Total knee arthroplasty Left 10/06/2013    Procedure: LEFT TOTAL KNEE ARTHROPLASTY;  Surgeon: Mcarthur Rossetti, MD;  Location: [facility];  Service: Orthopedics;  Laterality: Left;  . Esophagogastroduodenoscopy N/A 09/04/2014    Procedure: ESOPHAGOGASTRODUODENOSCOPY (EGD);  Surgeon: Alphonsa Overall, MD;  Location: [facility];  Service:  General;  Laterality: N/A;  :   Current outpatient prescriptions:  .  ALPRAZolam (XANAX) 1 MG tablet, , Disp: , Rfl:  .  lamoTRIgine (LAMICTAL) 100 MG tablet, Take 1 tablet (100 mg total) by mouth at bedtime., Disp: 30 tablet, Rfl: 12 .  lisinopril (PRINIVIL,ZESTRIL) 10 MG tablet, Take 10 mg by mouth every morning., Disp: , Rfl:  .  nystatin cream (MYCOSTATIN), Apply 1 application topically daily., Disp: , Rfl:  .  omeprazole (PRILOSEC) 40 MG capsule, Take 40 mg by mouth daily., Disp: , Rfl:  .  oxyCODONE-acetaminophen (PERCOCET) 7.5-325 MG per tablet, Take one tablet by mouth every 8 hours as needed for pain, Disp: 90 tablet, Rfl: 0 .  pantoprazole (PROTONIX) 40 MG tablet, Take 1 tablet (40 mg total) by mouth 2 (two) times daily., Disp: 180 tablet, Rfl: 0 .  risperiDONE (RISPERDAL) 1 MG tablet, Take 1 tablet (1 mg total) by mouth at bedtime., Disp: 30 tablet, Rfl: 12 .  rivaroxaban (XARELTO) 20 MG TABS tablet, Take 1 tablet (20 mg total) by mouth daily with supper., Disp: 30 tablet, Rfl: 12 .  tiZANidine (ZANAFLEX) 4 MG tablet, Take 1 tablet (4 mg total) by mouth 2 (two) times daily as needed for muscle spasms., Disp: 60 tablet, Rfl: 2 .  TraMADol HCl 50 MG TBDP, Take 1-2 tabs every 8 hours as needed for knee and back pain, Disp: 180 tablet, Rfl: 5 .  triamcinolone ointment (KENALOG) 0.1 %, Apply 1 application topically daily as needed (for rash). , Disp: , Rfl:  .  VESICARE 5 MG tablet, Take 5 mg by mouth daily., Disp: , Rfl: :  Allergies  Allergen Reactions  . Codeine Rash  :  Family History  Problem Relation Age of Onset  . Diabetes Mother   . Cancer Father     brain tumor, colon  . Diabetes Father   . Heart disease Father   . Heart disease Paternal Uncle   . Diabetes Paternal Uncle   . Diabetes Maternal Grandmother   . Heart disease Paternal Grandmother   . Heart disease Paternal Grandfather   . Diabetes Maternal Aunt   . Diabetes Maternal Uncle   . Diabetes Paternal Aunt    . Diabetes Maternal Grandfather   :  History   Social History  . Marital Status: Single    Spouse Name: N/A  . Number of Children: N/A  . Years of Education: N/A   Occupational History  . Not on file.   Social History Main Topics  . Smoking status: Never Smoker   . Smokeless tobacco: Never Used  . Alcohol Use: Yes     Comment: maybe once a month  . Drug Use: No  . Sexual Activity: Not on file   Other Topics Concern  . Not on file   Social History Narrative   Patient has been on disability since 2004 for knee pain, back pain, depression and acid reflux. She does work part-time now.  She is caring for her granddaughter Amia age 38 (has sickle cell, goes to [facility] every month and has had multiple hospitalizations).   :  Pertinent items are noted in HPI.  Exam: ECOG 0 Blood pressure 130/87, pulse 80, temperature 98.1 F (36.7 C), temperature source Oral, resp. rate 19, height 5\' 7"  (1.702 m), weight 334 lb 12.8 oz (151.864 kg), SpO2 100 %. General appearance: alert and cooperative Head: Normocephalic, without obvious abnormality Throat: lips, mucosa, and tongue normal; teeth and gums normal Neck: no adenopathy Back: negative Resp: clear to auscultation bilaterally Cardio: regular rate and rhythm, S1, S2 normal, no murmur, click, rub or gallop GI: soft, non-tender; bowel sounds normal; no masses,  no organomegaly Extremities: extremities normal, atraumatic, no cyanosis or edema Pulses: 2+ and symmetric Skin: Skin color, texture, turgor normal. No rashes or lesions Lymph nodes: Cervical, supraclavicular, and axillary nodes normal.  CBC    Component Value Date/Time   WBC 10.5 08/22/2014 0910   RBC 4.66 08/22/2014 0910   RBC 4.77 01/21/2011 1507   HGB 12.7 08/22/2014 0910   HCT 38.9 08/22/2014 0910   PLT 471* 08/22/2014 0910   MCV 83.5 08/22/2014 0910   MCH 27.3 08/22/2014 0910   MCHC 32.6 08/22/2014 0910   RDW 15.5 08/22/2014 0910   LYMPHSABS 3.2 08/22/2014  0910   MONOABS 0.8 08/22/2014 0910   EOSABS 0.1 08/22/2014 0910   BASOSABS 0.0 08/22/2014 0910      Assessment and Plan:   44 year old woman with the following issues:  1. Bilateral deep vein thrombosis diagnosed in June 2015. That episode was provoked by immobilization after orthopedic surgery. Her risk factor included obesity and less likely inherited thrombophilia. She had a few surgeries in the past including 3 previous pregnancies without postpartum complications.  The risks and benefits of stopping long-term anticoagulation versus continuing it were discussed. Complications from continuing anticoagulation include bleeding among other complications related to taking oral Xarelto. Taken anticoagulation long-term offers her protection given her morbid obesity and lack of mobility. She prefers to stay on it and I have no objections at this time.  As a limited value to obtaining a hypercoagulable panel this time as it will not alter our management given her decision which is I think it's a reasonable decision.  I do think obtaining a hypercoagulable panel would be useful if she decides to stop Xarelto.  She develops bleeding in the future it would be reasonable to stop it at that time and tests her for hypercoagulability.  My recommendation at this time is to continue it along as she is able to tolerate it.  2. Thrombocytosis: Her platelet count has fluctuated between the 400-500 range since 2010. This is most likely reactive in nature related to chronic iron deficiency. She is on iron supplement at this time which have helped her platelet count generally. I see no evidence to suggest myeloproliferative disorder or hematologic condition.  3. GIST diagnosed in December 2013 and the stomach. Status post surgical resection without any evidence of recurrence. She gets routine endoscopies without any evidence of tumor recurrence based on her most recent one in May 2016.  All her questions  were answered today. I will be happy to see her in the future as needed.

## 2014-10-10 NOTE — Progress Notes (Signed)
Please see consult note.  

## 2014-10-15 DIAGNOSIS — G5602 Carpal tunnel syndrome, left upper limb: Secondary | ICD-10-CM | POA: Diagnosis not present

## 2014-10-15 DIAGNOSIS — M545 Low back pain: Secondary | ICD-10-CM | POA: Diagnosis not present

## 2014-10-15 DIAGNOSIS — M542 Cervicalgia: Secondary | ICD-10-CM | POA: Diagnosis not present

## 2014-10-15 DIAGNOSIS — Z96652 Presence of left artificial knee joint: Secondary | ICD-10-CM | POA: Diagnosis not present

## 2014-10-17 ENCOUNTER — Encounter: Payer: Self-pay | Admitting: Family Medicine

## 2014-10-17 ENCOUNTER — Ambulatory Visit (INDEPENDENT_AMBULATORY_CARE_PROVIDER_SITE_OTHER): Payer: Medicare Other | Admitting: Family Medicine

## 2014-10-17 ENCOUNTER — Ambulatory Visit: Payer: Self-pay | Admitting: Dietician

## 2014-10-17 VITALS — BP 132/65 | HR 86 | Temp 98.1°F | Ht 67.0 in | Wt 335.0 lb

## 2014-10-17 DIAGNOSIS — G8929 Other chronic pain: Secondary | ICD-10-CM

## 2014-10-17 DIAGNOSIS — D75839 Thrombocytosis, unspecified: Secondary | ICD-10-CM

## 2014-10-17 DIAGNOSIS — I10 Essential (primary) hypertension: Secondary | ICD-10-CM

## 2014-10-17 DIAGNOSIS — D473 Essential (hemorrhagic) thrombocythemia: Secondary | ICD-10-CM

## 2014-10-17 DIAGNOSIS — Z7901 Long term (current) use of anticoagulants: Secondary | ICD-10-CM | POA: Diagnosis not present

## 2014-10-17 LAB — LIPID PANEL
Cholesterol: 168 mg/dL (ref 0–200)
HDL: 56 mg/dL (ref >=46)
LDL Cholesterol: 97 mg/dL (ref 0–99)
Total CHOL/HDL Ratio: 3 Ratio
Triglycerides: 75 mg/dL (ref <150)
VLDL: 15 mg/dL (ref 0–40)

## 2014-10-17 MED ORDER — OXYCODONE-ACETAMINOPHEN 7.5-325 MG PO TABS: ORAL_TABLET | ORAL | Status: DC

## 2014-10-17 MED ORDER — NYSTATIN 100000 UNIT/GM EX CREA: TOPICAL_CREAM | CUTANEOUS | Status: DC

## 2014-10-17 NOTE — Assessment & Plan Note (Signed)
Take her appointment with the oncologist helped her understand her anticoagulation needs. We discussed again today and she greased for this period time should talk to stay on chronic anticoagulation to decrease her risk of subsequent DVT/PE.

## 2014-10-17 NOTE — Assessment & Plan Note (Signed)
Hematologist agrees that thrombocytosis is most likely reactive, likely secondary to iron deficiency. She feels reassured by the consult.

## 2014-10-17 NOTE — Progress Notes (Signed)
   Subjective:    Patient ID: Crystal Garza, female    DOB: May 12, 1970, 44 y.o.   MRN: [id number]  HPI #1 follow-up left knee pain. Still has not heard back from the pain clinic. Pain is moderately well controlled on current regimen. Needs refill.  #2. Has some questions about her recent appointment with the oncologist.    Review of Systems No calf pain, no shortness of breath, no unusual weight change, no fever, sweats, chills.     Objective:   Physical Exam  Vital signs reviewed. GENERAL: Well-developed, well-nourished, no acute distress. CARDIOVASCULAR: Regular rate and rhythm no murmur gallop or rub LUNGS: Clear to auscultation bilaterally, no rales or wheeze. ABDOMEN: Soft positive bowel sounds NEURO: No gross focal neurological deficits. MSK: Movement of extremity x 4. Left knee flexion to 120, full extension. No effusion.        Assessment & Plan:

## 2014-10-17 NOTE — Assessment & Plan Note (Signed)
Refilled pain medicines.

## 2014-10-31 ENCOUNTER — Ambulatory Visit (INDEPENDENT_AMBULATORY_CARE_PROVIDER_SITE_OTHER): Payer: Medicare Other | Admitting: *Deleted

## 2014-10-31 DIAGNOSIS — Z111 Encounter for screening for respiratory tuberculosis: Secondary | ICD-10-CM | POA: Diagnosis not present

## 2014-11-01 ENCOUNTER — Other Ambulatory Visit: Payer: Self-pay | Admitting: Family Medicine

## 2014-11-02 ENCOUNTER — Ambulatory Visit (INDEPENDENT_AMBULATORY_CARE_PROVIDER_SITE_OTHER): Payer: Medicare Other | Admitting: *Deleted

## 2014-11-02 DIAGNOSIS — Z111 Encounter for screening for respiratory tuberculosis: Secondary | ICD-10-CM

## 2014-11-02 LAB — TB SKIN TEST
Induration: 0 mm
TB Skin Test: NEGATIVE

## 2014-11-02 NOTE — Progress Notes (Signed)
PPD Reading Note PPD read and results entered in Epic. Result: 0 mm induration. Interpretation: negative Allergic reaction: no 

## 2014-11-07 ENCOUNTER — Encounter: Payer: Self-pay | Admitting: Family Medicine

## 2014-11-19 ENCOUNTER — Ambulatory Visit (HOSPITAL_COMMUNITY): Payer: Medicare Other | Admitting: Psychiatry

## 2014-11-26 ENCOUNTER — Ambulatory Visit: Payer: Self-pay | Admitting: Dietician

## 2014-12-04 ENCOUNTER — Encounter: Payer: Self-pay | Admitting: Dietician

## 2014-12-04 ENCOUNTER — Encounter: Payer: Medicare Other | Attending: Surgery | Admitting: Dietician

## 2014-12-04 DIAGNOSIS — Z713 Dietary counseling and surveillance: Secondary | ICD-10-CM | POA: Insufficient documentation

## 2014-12-04 DIAGNOSIS — Z6841 Body Mass Index (BMI) 40.0 and over, adult: Secondary | ICD-10-CM | POA: Insufficient documentation

## 2014-12-04 NOTE — Progress Notes (Signed)
  Medical Nutrition Therapy:  Appt start time: 250 end time:  335   Assessment:  Primary concerns today: Crystal Garza states that she has regained 20 lbs since weight loss surgery. She had a sleeve gastrectomy in May 2014. She reports her lowest weight after sleeve surgery was 306 lbs. Highest weight before surgery was 388 lbs. 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 She may have vomiting 1x a week; feels like she may eat too fast. She does not drink while she eats. Crystal Garza is no longer taking her supplements. She lives with her fiance and 51-year old granddaughter. Everyone participates in the grocery shopping and her fiance does most of the cooking.   Preferred Learning Style:   No preference indicated   Learning Readiness:   Ready   MEDICATIONS: see list   DIETARY INTAKE:  Avoided foods include hamburger/beef, hot dogs, bread.    24-hr recall:  B ( AM): skips  Snk (10 AM): chips and water with sugar free flavoring  L ( PM): chicken leg and fries from [facility] or [employer] Snk ( PM):  D ( PM): baked chicken breast and fries or rice Snk ( PM): cheese and crackers  Beverages: water with sugar free flavoring, 1/2 sweet tea  Usual physical activity: walks at least a mile per day when she gets home from work  Estimated energy needs: 1300-1500 calories  Progress Towards Goal(s):  In progress.   Nutritional Diagnosis:  [name]-3.4 Unintentional weight gain As related to increase in convenience foods and fast foods.  As evidenced by patient report of frequently snacking on chips, cookies, and fried foods.    Intervention:  Nutrition counseling provided. Goals: -Start having a protein shake for breakfast Nurse, mental health) -Work on eating at least 3 meals a day -Stick with lean meats and avoid fried foods (refer to fast food guide) -Focus on lean meat and non starchy vegetables -Try packing an insulated  lunchbox with an ice pack to keep in your car (protein shakes, Dannon Light and Fit Greek yogurt, cheese and lunchmeat, protein chips, egg salad) -Get your slowcooker back from your daughter so you can prep meals ahead of time -Keep up walking routine  Flintstones "complete" (chewable, not gummy) 1500 mg Calcium citrate (500 mg 3x a day) - try Celebrate calcium chews (get these at the [place]) Vitamin B12 (sublingual)  Samples provided and patient instructed on proper use: Premier protein shake (vanilla - qty 2) Lot#: [id number] Exp: 05/2015  Celebrate calcium citrate chews (caramel - qty 2) Lot#: [id number] Exp: 07/2016  Teaching Method Utilized:  Visual Auditory Hands on  Handouts given during visit include:  Bariatric snack ideas  Phase 3A lean proteins  Phase 3B lean protein + non starchy vegetables  Bariatric Fast food guide  Barriers to learning/adherence to lifestyle change: food preferences  Demonstrated degree of understanding via:  Teach Back   Monitoring/Evaluation:  Dietary intake, exercise, and body weight in 2 month(s).

## 2014-12-04 NOTE — Patient Instructions (Addendum)
-  Start having a protein shake for breakfast Nurse, mental health) -Work on eating at least 3 meals a day -Stick with lean meats and avoid fried foods (refer to fast food guide) -Focus on lean meat and non starchy vegetables -Try packing an insulated lunchbox with an ice pack to keep in your car (protein shakes, Dannon Light and Fit Greek yogurt, cheese and lunchmeat, protein chips, egg salad) -Get your slowcooker back from your daughter so you can prep meals ahead of time -Keep up walking routine  Flintstones "complete" (chewable, not gummy) 1500 mg Calcium citrate (500 mg 3x a day) - try Celebrate calcium chews (get these at the [place]) Vitamin B12 (sublingual)

## 2014-12-07 ENCOUNTER — Telehealth: Payer: Self-pay | Admitting: Family Medicine

## 2014-12-07 DIAGNOSIS — M545 Low back pain: Secondary | ICD-10-CM | POA: Diagnosis not present

## 2014-12-07 DIAGNOSIS — R202 Paresthesia of skin: Secondary | ICD-10-CM | POA: Diagnosis not present

## 2014-12-07 DIAGNOSIS — M47816 Spondylosis without myelopathy or radiculopathy, lumbar region: Secondary | ICD-10-CM | POA: Diagnosis not present

## 2014-12-07 NOTE — Telephone Encounter (Signed)
Pt had an appt today at [facility] with Dr. Ernestina Patches. She states paperwork was faxed on Mon 12/03/14 for authorization for her procedure and she was calling to check on the status of this paperwork. I explained to the patient that the turn around time for received paperwork could take up to 14 days to be completed. She expressed understanding. Thank you, Fonda Kinder, ASA

## 2014-12-10 ENCOUNTER — Encounter (HOSPITAL_COMMUNITY): Payer: Self-pay

## 2014-12-10 ENCOUNTER — Emergency Department (HOSPITAL_COMMUNITY): Payer: Medicare Other

## 2014-12-10 ENCOUNTER — Encounter: Payer: Self-pay | Admitting: Obstetrics and Gynecology

## 2014-12-10 ENCOUNTER — Ambulatory Visit (INDEPENDENT_AMBULATORY_CARE_PROVIDER_SITE_OTHER): Payer: Medicare Other | Admitting: Obstetrics and Gynecology

## 2014-12-10 ENCOUNTER — Other Ambulatory Visit: Payer: Self-pay

## 2014-12-10 ENCOUNTER — Other Ambulatory Visit: Payer: Self-pay | Admitting: Family Medicine

## 2014-12-10 ENCOUNTER — Emergency Department (HOSPITAL_COMMUNITY)
Admission: EM | Admit: 2014-12-10 | Discharge: 2014-12-10 | Disposition: A | Discharge status: Home / Self Care | Payer: Medicare Other | Attending: Emergency Medicine | Admitting: Emergency Medicine

## 2014-12-10 VITALS — BP 148/72 | HR 74 | Temp 98.0°F | Ht 67.0 in | Wt 330.0 lb

## 2014-12-10 DIAGNOSIS — Z8659 Personal history of other mental and behavioral disorders: Secondary | ICD-10-CM | POA: Insufficient documentation

## 2014-12-10 DIAGNOSIS — I10 Essential (primary) hypertension: Secondary | ICD-10-CM | POA: Insufficient documentation

## 2014-12-10 DIAGNOSIS — R0789 Other chest pain: Secondary | ICD-10-CM

## 2014-12-10 DIAGNOSIS — Z86718 Personal history of other venous thrombosis and embolism: Secondary | ICD-10-CM | POA: Diagnosis not present

## 2014-12-10 DIAGNOSIS — Z87442 Personal history of urinary calculi: Secondary | ICD-10-CM | POA: Diagnosis not present

## 2014-12-10 DIAGNOSIS — R079 Chest pain, unspecified: Secondary | ICD-10-CM

## 2014-12-10 DIAGNOSIS — I209 Angina pectoris, unspecified: Secondary | ICD-10-CM | POA: Insufficient documentation

## 2014-12-10 DIAGNOSIS — G8929 Other chronic pain: Secondary | ICD-10-CM | POA: Diagnosis not present

## 2014-12-10 DIAGNOSIS — R112 Nausea with vomiting, unspecified: Secondary | ICD-10-CM | POA: Diagnosis not present

## 2014-12-10 DIAGNOSIS — Z862 Personal history of diseases of the blood and blood-forming organs and certain disorders involving the immune mechanism: Secondary | ICD-10-CM | POA: Insufficient documentation

## 2014-12-10 DIAGNOSIS — Z86711 Personal history of pulmonary embolism: Secondary | ICD-10-CM | POA: Insufficient documentation

## 2014-12-10 DIAGNOSIS — Z8719 Personal history of other diseases of the digestive system: Secondary | ICD-10-CM | POA: Insufficient documentation

## 2014-12-10 LAB — BASIC METABOLIC PANEL
Anion gap: 10 (ref 5–15)
BUN: <5 mg/dL — ABNORMAL LOW (ref 6–20)
CO2: 25 mmol/L (ref 22–32)
Calcium: 9.1 mg/dL (ref 8.9–10.3)
Chloride: 104 mmol/L (ref 101–111)
Creatinine, Ser: 0.7 mg/dL (ref 0.44–1.00)
GFR calc Af Amer: >60 mL/min (ref >60)
GFR calc non Af Amer: >60 mL/min (ref >60)
Glucose, Bld: 73 mg/dL (ref 65–99)
Potassium: 3.9 mmol/L (ref 3.5–5.1)
Sodium: 139 mmol/L (ref 135–145)

## 2014-12-10 LAB — CBC
HCT: 40.6 % (ref 36.0–46.0)
Hemoglobin: 12.8 g/dL (ref 12.0–15.0)
MCH: 26.6 pg (ref 26.0–34.0)
MCHC: 31.5 g/dL (ref 30.0–36.0)
MCV: 84.2 fL (ref 78.0–100.0)
Platelets: 423 10*3/uL — ABNORMAL HIGH (ref 150–400)
RBC: 4.82 MIL/uL (ref 3.87–5.11)
RDW: 14 % (ref 11.5–15.5)
WBC: 10.2 10*3/uL (ref 4.0–10.5)

## 2014-12-10 LAB — I-STAT TROPONIN, ED: Troponin i, poc: 0 ng/mL (ref 0.00–0.08)

## 2014-12-10 LAB — PROTIME-INR
INR: 0.99 (ref 0.00–1.49)
Prothrombin Time: 13.3 seconds (ref 11.6–15.2)

## 2014-12-10 LAB — D-DIMER, QUANTITATIVE: D-Dimer, Quant: 0.55 ug/mL-FEU — ABNORMAL HIGH (ref 0.00–0.48)

## 2014-12-10 MED ORDER — IOHEXOL 350 MG/ML SOLN
100.0000 mL | Freq: Once | INTRAVENOUS | Status: AC | PRN
  Administered 2014-12-10: 100 mL via INTRAVENOUS

## 2014-12-10 MED ORDER — PREDNISONE 50 MG PO TABS: 50.0000 mg | ORAL_TABLET | Freq: Every day | ORAL | Status: DC

## 2014-12-10 MED ORDER — ONDANSETRON HCL 4 MG/2ML IJ SOLN
4.0000 mg | Freq: Once | INTRAMUSCULAR | Status: AC
  Administered 2014-12-10: 4 mg via INTRAVENOUS

## 2014-12-10 MED ORDER — ASPIRIN 325 MG PO TABS
325.0000 mg | ORAL_TABLET | Freq: Once | ORAL | Status: AC
  Administered 2014-12-10: 325 mg via ORAL

## 2014-12-10 NOTE — Patient Instructions (Signed)
Transfer to ED for active chest pain

## 2014-12-10 NOTE — ED Provider Notes (Signed)
Complains of left anterior chest pain and left back pain onset yesterday. Accompanied by bilateral arm pain. Pain is worse with changing position improved with remaining still. Pain is nonexertional. Denies shortness of breath. Lungs clear auscultation heart regular rate and rhythm abdomen obese normal active bowel sounds nontender. Extremities without edema. Skin warm dry. Chest is tender at left side anteriorly, reproducing pain exactly. Pain is also reproducible when she sits up from a supine position Heart score equals 3 based on risk factors of obesity, family history(reports father had congestive heart failure age 77) and hypertension,, age . Story is not suspicious for pulmonary ACS. EKG is normal  Orlie Dakin, MD 12/10/14 [phone]

## 2014-12-10 NOTE — ED Notes (Addendum)
Pt returned from CT.  CT transport informed the wrier that the IV infiltrated in CT.

## 2014-12-10 NOTE — ED Provider Notes (Signed)
History   Chief Complaint  Patient presents with  . Chest Pain    HPI 44 year old female with past medical history as below notable for no known coronary artery disease, obesity, history of DVT/PE on Xarelto, chronic back pain on Percocet who presents to ED complaining of chest pain. Patient reports she has had constant achy chest pain over her left anterior chest and neck for the past 3 days. Denies intermittency or waxing/waning character. Patient was sent from family medicine for further evaluation. Patient reports it is easily reproducible with palpation and movements. She denies any shortness of breath, vomiting, leg pain, leg swelling. She says this does not feel similar to her PE in the past. Patient has been taking her medications as instructed. Denies any recent illness, fevers, chills, cough. Patient states she tried Percocet for her pain which did not help. Additionally, she has tried Zanaflex which has not helped either. No other complaints at this time.   Pt does not smoke. This is not associated with food.    Past medical/surgical history, social history, medications, allergies and FH have been reviewed with patient and/or in documentation.  Past Medical History  Diagnosis Date  . Anemia     iron def  . Hypertension   . Morbid obesity   . GERD (gastroesophageal reflux disease)   . Anxiety   . Depression   . Migraine   . Back pain   . Chronic knee pain   . Arthritis     left knee   . Hx of laparoscopic gastric banding   . Anginal pain   . Kidney stones   . DVT (deep venous thrombosis)   . PE (pulmonary embolism)    Past Surgical History  Procedure Laterality Date  . Abdominal hysterectomy    . Knee arthroscopy    . Cesarean section  05/1991  . Breath tek h pylori  07/28/2011    Procedure: BREATH TEK H PYLORI;  Surgeon: [name], MD;  Location: [facility];  Service: General;  Laterality: N/A;  to be done at 745  . Esophageal biopsy  02/16/2012   Procedure: BIOPSY;  Surgeon: [name], MD;  Location: [facility];  Service: General;;  biopsy of mass x 2  . Eus  03/03/2012    Procedure: UPPER ENDOSCOPIC ULTRASOUND (EUS) LINEAR;  Surgeon: Milus Banister, MD;  Location: [facility];  Service: Endoscopy;  Laterality: N/A;  . Tubal ligation    . Laparoscopic gastrectomy  04/08/2012    Procedure: LAPAROSCOPIC GASTRECTOMY;  Surgeon: [name], MD;  Location: [facility];  Service: General;;  removal of GIST tumor of stomach  . Laparoscopic gastric sleeve resection N/A 08/29/2012    Procedure: LAPAROSCOPIC GASTRIC SLEEVE RESECTION;  Surgeon: [name], MD;  Location: [facility];  Service: General;  Laterality: N/A;  Sleeve Gastrectomy  . Esophagogastroduodenoscopy N/A 08/29/2012    Procedure: ESOPHAGOGASTRODUODENOSCOPY (EGD);  Surgeon: [name], MD;  Location: [facility];  Service: General;  Laterality: N/A;  . Total knee arthroplasty Left 10/06/2013    Procedure: LEFT TOTAL KNEE ARTHROPLASTY;  Surgeon: Mcarthur Rossetti, MD;  Location: [facility];  Service: Orthopedics;  Laterality: Left;  . Esophagogastroduodenoscopy N/A 09/04/2014    Procedure: ESOPHAGOGASTRODUODENOSCOPY (EGD);  Surgeon: Alphonsa Overall, MD;  Location: [facility];  Service: General;  Laterality: N/A;   Family History  Problem Relation Age of Onset  . Diabetes Mother   . Cancer Father     brain tumor, colon  .  Diabetes Father   . Heart disease Father   . Heart disease Paternal Uncle   . Diabetes Paternal Uncle   . Diabetes Maternal Grandmother   . Heart disease Paternal Grandmother   . Heart disease Paternal Grandfather   . Diabetes Maternal Aunt   . Diabetes Maternal Uncle   . Diabetes Paternal Aunt   . Diabetes Maternal Grandfather    History  Substance Use Topics  . Smoking status: Never Smoker   . Smokeless tobacco: Never Used  . Alcohol Use: Yes     Comment: maybe once a month     Review of Systems Constitutional: Negative for fever, chills and  fatigue. - diaphoresis HENT: Negative for congestion, rhinorrhea and sore throat.   Eyes: Negative for visual disturbance.  Respiratory: - SOB, Negative for cough and wheezing.   Cardiovascular: + for CP.  Gastrointestinal: + nausea, - vomiting; denies abdominal pain and diarrhea.  Genitourinary: Negative for flank pain, dysuria, frequency.  Musculoskeletal: Negative for back pain, neck pain and neck stiffness, leg pain/swelling.  Skin: Negative for rash.  Neurological: Negative for dizziness and headaches.  All other systems reviewed and are negative.   Physical Exam  Physical Exam  ED Triage Vitals  Enc Vitals Group     BP 12/10/14 1707 131/66 mmHg     Pulse Rate 12/10/14 1707 70     Resp 12/10/14 1707 18     Temp 12/10/14 1707 98.6 F (37 C)     Temp Source 12/10/14 1707 Oral     SpO2 12/10/14 1707 100 %     Weight --      Height --      Head Cir --      Peak Flow --      Pain Score 12/10/14 1723 7     Pain Loc --      Pain Edu? --      Excl. in [place]? --    Constitutional: Patient is morbidly obese 44 year old female  in no acute distress Head: Normocephalic and atraumatic.  Eyes: Extraocular motion intact, no scleral icterus Neck: Supple without meningismus, mass, or overt JVD.  Respiratory: Effort normal and breath sounds normal. No respiratory distress. Chest wall - Very easily reproducible CP on exam CV: Heart regular rate and rhythm, no obvious murmurs.  Pulses +2 and symmetric. Euvolemic on exam. Abdomen: Soft, non-tender, non-distended MSK: Extremities are atraumatic without deformity, ROM intact. No calf TTP. No lower extremity edema noted Skin: Warm, dry, intact. Neuro: Alert and oriented, no motor deficit noted Psychiatric: Mood and affect are normal.    ED Course  Procedures   Labs Reviewed  BASIC METABOLIC PANEL - Abnormal; Notable for the following:    BUN <5 (*)    All other components within normal limits  CBC - Abnormal; Notable for the  following:    Platelets 423 (*)    All other components within normal limits  D-DIMER, QUANTITATIVE (NOT AT [facility]) - Abnormal; Notable for the following:    D-Dimer, Quant 0.55 (*)    All other components within normal limits  PROTIME-INR  I-STAT TROPOININ, ED   I personally reviewed and interpreted all labs.  MDM: Crystal Garza is a 44 y.o. female who p/w CP. ABCs intact. HDS, NAD. Cardiac w/u initiated.  EKG personally reviewed by myself and showed: normal rate, sinus rhythm, normal axis. NSTWA, no signs of acute ischemia or right heart strain.  Patient's pain is very easily reproduced with palpation in ED  today raising concern for musculoskeletal chest pain. This is a very atypical presentation for ACS. HEART Score 3.  Epic records reviewed, note from family medicine clinic reviewed and they were concerned about ACS based on heart score of 5. Additionally, they noted concern for musculoskeletal chest pain. My heart score was calculated to be 3 today. Initial Tn neg. No indication for delta given constant pain for several days without waxing/waning character.  Pt has Wells score of 1.5 (prior DVT/PE), fails PERC (h/o DVT/PE); PE low risk. Dimer sent.  Pain does not radiate to back, blood pressure is stable. No mediastinal widening on CXR. Dissection unlikely. Pt's breath sounds are equal bilaterally. No PTX on CXR. PTX unlikely. Pain is not associated with meals. GERD unlikely. Pain is not positional and EKG is WNL. Pericarditis unlikely. No h/o cocaine use. Pt reports no cough or fever. No infiltrate on CXR. PNA unlikely. No Hx of exertion, trauma.  Dimer elevated slightly; PE study ordered and neg for PE.  Pain well controlled in ED. Pt remained HDS, NAD while in ED. Prednisone rx given for msk pain / costochondritis.  Clinical Impression: 1. Musculoskeletal chest pain   2. Chest pain     Disposition: Discharge  Condition: Good  I have discussed the results, Dx and Tx plan  with the pt(& family if present). He/she/they expressed understanding and agree(s) with the plan. Discharge instructions discussed at great length. Strict return precautions discussed and pt &/or family have verbalized understanding of the instructions. No further questions at time of discharge.    Discharge Medication List as of 12/10/2014  9:39 PM    START taking these medications   Details  predniSONE (DELTASONE) 50 MG tablet Take 1 tablet (50 mg total) by mouth daily., Starting 12/10/2014, Until Discontinued, Print        Follow Up: Dickie La, MD [address] [place] [address] [phone]  Schedule an appointment as soon as possible for a visit If not improved in 2 days  [place] [address] [phone] [place] [phone]  If symptoms worsen   Pt seen in conjunction with Dr. Adria Dill, [facility] Resident - PGY-3             Kirstie Peri, MD 12/11/14 [place], MD 12/11/14 1141

## 2014-12-10 NOTE — ED Notes (Signed)
Pt gone to CT 

## 2014-12-10 NOTE — Discharge Instructions (Signed)

## 2014-12-10 NOTE — ED Notes (Addendum)
Pt refused to take pain verbal order pf [facility]co states "i'll just take mine when I get home." Pt call ride home, nurse to discharge now

## 2014-12-10 NOTE — Progress Notes (Signed)
   Subjective:   Patient ID: Crystal Garza, female    DOB: [birth year], 44 y.o.   MRN: [id number]  Patient presents for Same Day Appointment  CC: Chest Pain  HPI: #CHEST PAIN Chest pain began Friday and has been gradually worsening. Pain is: located in her left chest How severe is the pain: 10/10 yesterday and currently 8/10 What worsens the pain: movement What makes the pain better: nothing Radiation: neck and down left arm, no jaw radiation Try taking Percocet for pain relief which did not help Describes the pain as a constant pressure type of pain; also achy Pain has not subsided Patient states she did not seek treatment earlier because she did not want to go to hospital and wait around  [place] History of blood clot: Yes, on Xarelto  FH of heart disease  Symptoms Nausea: yes vomiting: no Shortness of breath: no Cough: no Swelling of legs: no Syncope: no Heart burn or food sticking: no  Denies smoking  Review of Systems   See HPI for ROS.   Past medical history, surgical, family, and social history reviewed and updated in the EMR as appropriate.  Objective:  BP 148/72 mmHg  Pulse 74  Temp(Src) 98 F (36.7 C) (Oral)  Ht 5\' 7"  (1.702 m)  Wt 330 lb (149.687 kg)  BMI 51.67 kg/m2 Vitals and nursing note reviewed  Physical Exam  Constitutional: She is oriented to person, place, and time. She appears distressed.  Obese  Cardiovascular: Normal rate, regular rhythm, normal heart sounds and intact distal pulses.   Pulmonary/Chest: Effort normal and breath sounds normal. She has no wheezes. She exhibits tenderness.  Musculoskeletal: She exhibits no edema.  Neurological: She is alert and oriented to person, place, and time.  Skin: Skin is warm and dry.   Assessment & Plan:  A: Chest pain concerning for ACS. Heart score 5. Risk factors include HTN, obesity, FH. No known cardiac disease. Patient with some reproducibility in pain with chest palpation concerning for some  MSK component.   P: -Patient to go to ED for further evaluation -Called [facility] to transport due to active nature of chest pain -Patient received aspirin and nitroglycerin in office -Will forward note to PCP  Discussed with Dr. Ree Kida (Preceptor)   Luiz Blare, DO 12/10/2014, 3:40 PM PGY-2, [place]

## 2014-12-10 NOTE — Addendum Note (Signed)
Addended by: Valerie Roys on: 12/10/2014 05:12 PM   Modules accepted: Orders

## 2014-12-10 NOTE — ED Notes (Signed)
Patient returned from X-ray 

## 2014-12-10 NOTE — ED Notes (Signed)
PER EMS: Pt from [facility] practice; sent here for sharp, substernal CP radiating to left arm, neck and back; onset Friday, reproducible. Pt was given 325 aspirin, 4mg  zofran, one nitro tab given with minimal relief.  BP-137/63, HR-72 regular, RR-20, O2-100 RA. A&OX4.

## 2014-12-11 MED ORDER — SODIUM CHLORIDE 0.9 % IV SOLN
INTRAVENOUS | Status: DC
  Administered 2014-12-10: 16:00:00 via INTRAVENOUS

## 2014-12-11 NOTE — Addendum Note (Signed)
Addended by: Burna Forts A on: 12/11/2014 04:50 PM   Modules accepted: Orders

## 2014-12-11 NOTE — Telephone Encounter (Signed)
Pt called and would like to know when the doctor was going to sign the papers that where faxed to [name] so that she can get her shot in the back. Patient is very upset and in pain. jw

## 2014-12-12 ENCOUNTER — Ambulatory Visit: Payer: Self-pay | Admitting: Student

## 2014-12-13 DIAGNOSIS — M545 Low back pain: Secondary | ICD-10-CM | POA: Diagnosis not present

## 2014-12-13 DIAGNOSIS — M47816 Spondylosis without myelopathy or radiculopathy, lumbar region: Secondary | ICD-10-CM | POA: Diagnosis not present

## 2014-12-26 ENCOUNTER — Ambulatory Visit (INDEPENDENT_AMBULATORY_CARE_PROVIDER_SITE_OTHER): Payer: Medicare Other | Admitting: Family Medicine

## 2014-12-26 DIAGNOSIS — G8929 Other chronic pain: Secondary | ICD-10-CM

## 2014-12-26 MED ORDER — OXYCODONE-ACETAMINOPHEN 7.5-325 MG PO TABS: ORAL_TABLET | ORAL | Status: DC

## 2014-12-27 NOTE — Progress Notes (Signed)
Patient ID: Crystal Garza, female   DOB: 1970/08/26, 44 y.o.   MRN: [id number] Patient had another appointment scheduled this morning and needed to leave the office by 12:15. Virtually always running late so she opted to reschedule. I did give her prescription for her pain medicine refills. See the chart for details.

## 2015-01-02 ENCOUNTER — Ambulatory Visit (INDEPENDENT_AMBULATORY_CARE_PROVIDER_SITE_OTHER): Payer: 59 | Admitting: Psychiatry

## 2015-01-02 ENCOUNTER — Encounter (HOSPITAL_COMMUNITY): Payer: Self-pay | Admitting: Psychiatry

## 2015-01-02 ENCOUNTER — Ambulatory Visit (INDEPENDENT_AMBULATORY_CARE_PROVIDER_SITE_OTHER): Payer: Medicare Other | Admitting: Family Medicine

## 2015-01-02 ENCOUNTER — Encounter: Payer: Self-pay | Admitting: Family Medicine

## 2015-01-02 VITALS — BP 135/70 | HR 77 | Ht 67.0 in | Wt 333.0 lb

## 2015-01-02 VITALS — BP 130/63 | HR 90 | Temp 97.8°F | Ht 67.0 in | Wt 331.4 lb

## 2015-01-02 DIAGNOSIS — Z7901 Long term (current) use of anticoagulants: Secondary | ICD-10-CM

## 2015-01-02 DIAGNOSIS — M546 Pain in thoracic spine: Secondary | ICD-10-CM

## 2015-01-02 DIAGNOSIS — F3170 Bipolar disorder, currently in remission, most recent episode unspecified: Secondary | ICD-10-CM

## 2015-01-02 DIAGNOSIS — G8929 Other chronic pain: Secondary | ICD-10-CM | POA: Diagnosis not present

## 2015-01-02 DIAGNOSIS — F3130 Bipolar disorder, current episode depressed, mild or moderate severity, unspecified: Secondary | ICD-10-CM | POA: Diagnosis not present

## 2015-01-02 MED ORDER — LAMOTRIGINE 150 MG PO TABS: 150.0000 mg | ORAL_TABLET | Freq: Every day | ORAL | Status: DC

## 2015-01-02 MED ORDER — CLONAZEPAM 0.5 MG PO TABS: 0.5000 mg | ORAL_TABLET | Freq: Two times a day (BID) | ORAL | Status: DC

## 2015-01-02 NOTE — Assessment & Plan Note (Signed)
Briefly discussed chronic pain medicine today. He she has just gotten a refill on her narcotic pain medicine. I want to taper her off that he start something different such as tramadol. She has used tramadol in the past and did not find it useful. She [place]. We'll discuss this further when I see her back in a month.

## 2015-01-02 NOTE — Progress Notes (Signed)
   Subjective:    Patient ID: Crystal Garza, female    DOB: 09/29/1970, 44 y.o.   MRN: [id number]  HPI #1. Upper back and neck pain. Has had this chronically for 4-5 years but in the last 2 or 3 months it's gotten worse. Notably she has gained some of her weight back. 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 She does some fairly heavy lifting, pushing and pulling. She thinks this may be contributing to her issues. #2. Left carpal tunnel symptoms with numbness in her hand particularly when she's doing activity such as braiding her granddaughter's hair. Has previously had nerve conduction studies done that showed moderate carpal tunnel. She doesn't want to have surgery. She's just gotten back to regular work after her knee surgery so she like to put this off possible. She has previously had carpal tunnel surgery on the right. #3. Saw her new psychiatrist who changed her then to Klonopin and increased her Lamictal a little bit but otherwise did not make any major changes. #4. Chronic knee pain. Unchanged. #5. Saw an orthopedist about some low back pain she had been having and they were considering doing some type of injection therapy. They did not want to do without her being clear to stop her chronic anticoagulation for couple of days and were evidently unable to get hold of this office.   Review of Systems She has had some gradual weight gain and she's very unhappy about this, no diarrhea, no abdominal pain, no change in stool habits and no blood in stool. No fever, sweats, chills. No unusual fatigue. No dizziness. See history of present illness for additional pertinent review of systems. All    Objective:   Physical Exam Vital signs reviewed GEN.: Well-developed overweight female no acute distress NECK: full range of motion. The cervical vertebra are nontender to palpation. Negative Spurling's. Upper back reveals some bilateral muscle spasm left greater than right in the paravertebral  muscles in the rhomboid area. The trapezius muscles are spared any spasm. Extremity: Upper extremity strength 5 out of 5 in all planes the rotator cuff. LIKE: Alert and oriented 4. Affect is interactive. Speech is normal in content and fluency. She answers asks questions appropriately.       Assessment & Plan:   regarding her upper back pain, I would recommend some exercises such as wall push ups and overhead press. I think it's muscular partly due to deconditioning, partly due to weight gain and macromastia.

## 2015-01-02 NOTE — Assessment & Plan Note (Signed)
I gave her a handwritten prescription that she can show the  orthopedist. I think it would be fine for her to stop anticoagulation for 5-10 days if it is needed for procedure.
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
## 2015-01-02 NOTE — Assessment & Plan Note (Signed)
Has new psychiatrist and seems to be going well.

## 2015-01-10 ENCOUNTER — Other Ambulatory Visit: Payer: Self-pay | Admitting: Family Medicine

## 2015-01-11 NOTE — Telephone Encounter (Signed)
Refill complete.  Derl Barrow, RN

## 2015-01-15 ENCOUNTER — Telehealth: Payer: Self-pay | Admitting: Family Medicine

## 2015-01-15 NOTE — Telephone Encounter (Signed)
Family Medicine Emergency Line Telephone Note  Patient calls to reports lightheadedness x2 hours, constant.  She first noticed sudden onset dizziness while sitting in recliner.  She stood up slowly and found that she was off balance and stumbling to kitchen.  She tried to lay down, but had no improvement in symptoms.  Turning her head to the other side does not improve symptoms either.  When she closes her eyes, she feels the bed spinning.  She has associated nausea, no vomiting, numbness/weakness.    She reports that she googled her symptoms and thinks this is vertigo and wonders what medication will make it go away.  I explained my concern that this could be a stroke with sudden onset of symptoms and ataxic gait.  She should be evaluated in ED.  Patient is young for CVA, but problem list makes it appear that she is medically older than chronological age.  Patient reports that she cannot go to ED now, but if she is not feeling better within the next hour or so, she will come to hospital.  Urged her again to be evaluated in ED.   Virginia Crews, MD, MPH PGY-2,  [facility] 01/15/2015 11:45 PM

## 2015-01-24 ENCOUNTER — Ambulatory Visit (INDEPENDENT_AMBULATORY_CARE_PROVIDER_SITE_OTHER): Payer: 59 | Admitting: Psychiatry

## 2015-01-24 ENCOUNTER — Encounter (HOSPITAL_COMMUNITY): Payer: Self-pay | Admitting: Psychiatry

## 2015-01-24 VITALS — BP 134/68 | HR 103 | Ht 67.0 in | Wt 333.2 lb

## 2015-01-24 DIAGNOSIS — F3130 Bipolar disorder, current episode depressed, mild or moderate severity, unspecified: Secondary | ICD-10-CM

## 2015-01-24 MED ORDER — LAMOTRIGINE 150 MG PO TABS: 150.0000 mg | ORAL_TABLET | Freq: Every day | ORAL | Status: DC
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
## 2015-02-06 ENCOUNTER — Ambulatory Visit (INDEPENDENT_AMBULATORY_CARE_PROVIDER_SITE_OTHER): Payer: Medicare Other | Admitting: Family Medicine

## 2015-02-06 ENCOUNTER — Encounter: Payer: Self-pay | Admitting: Family Medicine

## 2015-02-06 VITALS — BP 125/64 | HR 88 | Temp 98.2°F | Ht 67.0 in | Wt 333.0 lb

## 2015-02-06 DIAGNOSIS — Z23 Encounter for immunization: Secondary | ICD-10-CM | POA: Diagnosis not present

## 2015-02-06 DIAGNOSIS — C49A2 Gastrointestinal stromal tumor of stomach: Secondary | ICD-10-CM

## 2015-02-06 DIAGNOSIS — G8929 Other chronic pain: Secondary | ICD-10-CM

## 2015-02-06 DIAGNOSIS — F3170 Bipolar disorder, currently in remission, most recent episode unspecified: Secondary | ICD-10-CM | POA: Diagnosis not present

## 2015-02-06 MED ORDER — OXYCODONE-ACETAMINOPHEN 7.5-325 MG PO TABS: ORAL_TABLET | ORAL | Status: DC

## 2015-02-06 NOTE — Patient Instructions (Signed)
Start the TWO exercises we talked about I will see you in a month for follow up I am putting a new pain referral in  If you want to attack the weight plateau I would suggest trying this: Add one day a week of some new type of activity that is sort of EXTREME. By this I mean maybe 45 minutes of walking or riding a bike for 30 minutes. Hopefully this will shock your system into restarting her metabolic engine. I think you're doing great  Overall.! Regarding the Klonopin from your  psychiatrist , I suspect the dizziness was related to the dose. Try cutting the tablet into fourths and see if that works as well for you.

## 2015-02-07 DIAGNOSIS — M542 Cervicalgia: Secondary | ICD-10-CM | POA: Diagnosis not present

## 2015-02-07 DIAGNOSIS — G5602 Carpal tunnel syndrome, left upper limb: Secondary | ICD-10-CM | POA: Diagnosis not present

## 2015-02-09 NOTE — Assessment & Plan Note (Signed)
Established this to [facility].  We discussed possibly decreasing her dose of Klonopin to see if that decreased her side effects.  Otherwise her psychiatric medications will be managed by new psychiatrist.

## 2015-02-09 NOTE — Assessment & Plan Note (Signed)
Not having any abdominal pain or other GI symptoms.  We'll continue to follow closely.

## 2015-02-09 NOTE — Assessment & Plan Note (Signed)
Chronic pain multifactorial including TKR left knee, chronic low back pain.  Orbit obesity contributes since she is actively trying to lose weight.  I will do a referral for her to pain clinic. Refills today

## 2015-02-09 NOTE — Progress Notes (Signed)
   Subjective:    Patient ID: Crystal Garza, female    DOB: 1970/05/22, 44 y.o.   MRN: [id number]  HPI 1.Follow-up chronic pain.  Doing well on current regimen, does not want to change.  Is willing to go to pain clinic. #2.  Saw her new psychiatrist.  He changed her from Xanax to Klonopin.  Klonopin seemed to make her dizzy so she discontinued it but has been having some anxiety. 3. GI ST tumor: No abdominal pain.  Continues to have some intermittent reflux.Marland Kitchen  Has been trying to lose weight and is not having much success. #4.  Chronic anticoagulation: She continues on xarelto with no bleeding episodes.    Review of Systems Pertinent review of systems: negative for fever or unusual weight change.See HPI.     Objective:   Physical Exam  Vital signs reviewed. GENERAL: Well-developed, well-nourished, no acute distress. CARDIOVASCULAR: Regular rate and rhythm no murmur gallop or rub LUNGS: Clear to auscultation bilaterally, no rales or wheeze. ABDOMEN: Soft positive bowel sounds NEURO: No gross focal neurological deficits. MSK: Movement of extremity x 4.Left knee full extension but some stiffness at the extremes of extension.  Full flexion.  Calves bilaterally are soft.        Assessment & Plan:

## 2015-02-12 ENCOUNTER — Telehealth: Payer: Self-pay | Admitting: Family Medicine

## 2015-02-12 NOTE — Telephone Encounter (Signed)
Pt called because she was referred to a pain clinic for her pain. She went there and they wanted to give her a shot in her back but she doesn't want them to do this and told them. They told her that if that can not manage her pain by giving her injections in the back then they would not be able to see her.They said they would have to manage all aspects of her pain and not be able to go to different doctor for certain pain management. Please call patient when possible to discuss. jw

## 2015-02-13 ENCOUNTER — Ambulatory Visit: Payer: Medicare Other | Attending: Sports Medicine

## 2015-02-13 DIAGNOSIS — R6889 Other general symptoms and signs: Secondary | ICD-10-CM

## 2015-02-13 DIAGNOSIS — M545 Low back pain: Secondary | ICD-10-CM | POA: Diagnosis present

## 2015-02-13 DIAGNOSIS — M25519 Pain in unspecified shoulder: Secondary | ICD-10-CM

## 2015-02-13 DIAGNOSIS — M542 Cervicalgia: Secondary | ICD-10-CM | POA: Diagnosis present

## 2015-02-13 DIAGNOSIS — M436 Torticollis: Secondary | ICD-10-CM | POA: Diagnosis present

## 2015-02-13 DIAGNOSIS — R293 Abnormal posture: Secondary | ICD-10-CM | POA: Insufficient documentation

## 2015-02-13 NOTE — Therapy (Signed)
[place], [place], [address] Phone: [phone]   Fax:  [phone]  Physical Therapy Evaluation  Patient Details  Name: Crystal Garza MRN: [id number] Date of Birth: May 21, 1970 Referring Provider:  Gerda Diss, MD  Encounter Date: 02/13/2015      PT End of Session - 02/13/15 0842    Visit Number 1   Number of Visits 12   Date for PT Re-Evaluation 03/27/15   PT Start Time 0807   PT Stop Time 0842   PT Time Calculation (min) 35 min   Activity Tolerance Patient tolerated treatment well   Behavior During Therapy [facility] for tasks assessed/performed      Past Medical History  Diagnosis Date  . Anemia     iron def  . Hypertension   . Morbid obesity ([place])   . GERD (gastroesophageal reflux disease)   . Anxiety   . Depression   . Migraine   . Back pain   . Chronic knee pain   . Arthritis     left knee   . Hx of laparoscopic gastric banding   . Anginal pain ([place])   . Kidney stones   . DVT (deep venous thrombosis) ([place])   . PE (pulmonary embolism)     Past Surgical History  Procedure Laterality Date  . Abdominal hysterectomy    . Knee arthroscopy    . Cesarean section  05/1991  . Breath tek h pylori  07/28/2011    Procedure: BREATH TEK H PYLORI;  Surgeon: [name], MD;  Location: [facility];  Service: General;  Laterality: N/A;  to be done at 745  . Esophageal biopsy  02/16/2012    Procedure: BIOPSY;  Surgeon: [name], MD;  Location: [facility];  Service: General;;  biopsy of mass x 2  . Eus  03/03/2012    Procedure: UPPER ENDOSCOPIC ULTRASOUND (EUS) LINEAR;  Surgeon: Milus Banister, MD;  Location: [facility];  Service: Endoscopy;  Laterality: N/A;  . Tubal ligation    . Laparoscopic gastrectomy  04/08/2012    Procedure: LAPAROSCOPIC GASTRECTOMY;  Surgeon: [name], MD;  Location: [facility];  Service: General;;  removal of GIST tumor of stomach  . Laparoscopic gastric  sleeve resection N/A 08/29/2012    Procedure: LAPAROSCOPIC GASTRIC SLEEVE RESECTION;  Surgeon: [name], MD;  Location: [facility];  Service: General;  Laterality: N/A;  Sleeve Gastrectomy  . Esophagogastroduodenoscopy N/A 08/29/2012    Procedure: ESOPHAGOGASTRODUODENOSCOPY (EGD);  Surgeon: [name], MD;  Location: [facility];  Service: General;  Laterality: N/A;  . Total knee arthroplasty Left 10/06/2013    Procedure: LEFT TOTAL KNEE ARTHROPLASTY;  Surgeon: Mcarthur Rossetti, MD;  Location: [facility];  Service: Orthopedics;  Laterality: Left;  . Esophagogastroduodenoscopy N/A 09/04/2014    Procedure: ESOPHAGOGASTRODUODENOSCOPY (EGD);  Surgeon: Alphonsa Overall, MD;  Location: [facility];  Service: General;  Laterality: N/A;    There were no vitals filed for this visit.  Visit Diagnosis:  Stiffness of neck - Plan: PT plan of care cert/re-cert  Neck and shoulder pain - Plan: PT plan of care cert/re-cert  Abnormal posture - Plan: PT plan of care cert/re-cert  Activity intolerance - Plan: PT plan of care cert/re-cert      Subjective Assessment - 02/13/15 0807    Subjective Chronic LBP and she purchased  a TENS unit which has not decreased pain. She now reports RT shoulder and neck pain. This started 2  weeks ago. No injury as the pain was in LT side at first butnow in RT side.  She reports MD  thinks she has pinched nerve but xrays negative. She has used cold and heat with no benefit   Limitations --  Work tasks like vacumming ,cleaning, lifting trash harder   Diagnostic tests x rays negative   Patient Stated Goals Releive pain for more mobility   Currently in Pain? Yes   Pain Score 7    Pain Location Neck  and shoulder   Pain Orientation Anterior;Posterior;Right   Pain Descriptors / Indicators Throbbing   Pain Onset 1 to 4 weeks ago   Pain Frequency Constant   Aggravating Factors  Work tasks   Pain Relieving Factors Nothing   Multiple Pain Sites --  Other areas chronic             [facility] PT Assessment - 02/13/15 0816    Assessment   Medical Diagnosis neck pain   Onset Date/Surgical Date --  2-4 weeks ago   Next MD Visit 03/21/15   Prior Therapy No   Precautions   Precautions None   Restrictions   Weight Bearing Restrictions No   Balance Screen   Has the patient fallen in the past 6 months No   Has the patient had a decrease in activity level because of a fear of falling?  No   Is the patient reluctant to leave their home because of a fear of falling?  No   Prior Function   Level of Independence Independent   Cognition   Overall Cognitive Status Within Functional Limits for tasks assessed   ROM / Strength   AROM / PROM / Strength AROM;Strength   AROM   AROM Assessment Site Cervical   Cervical Flexion 20   Cervical Extension 45   Cervical - Right Side Bend 25   Cervical - Left Side Bend 42   Cervical - Right Rotation 63   Cervical - Left Rotation 60   Strength   Overall Strength Comments UE WNL   Ambulation/Gait   Gait Comments Independent ambulation                   [facility] Adult PT Treatment/Exercise - 02/13/15 0816    Manual Therapy   Manual Therapy Taping   Kinesiotex Inhibit Muscle   Kinesiotix   Inhibit Muscle  Strip tpo scalenes, Y to paraspnals prox to disatl applied and strip across lower cervical iwth 50% pull                PT Education - 02/13/15 0837    Education provided Yes   Education Details POC, tape management   Person(s) Educated Patient   Methods Explanation   Comprehension Verbalized understanding          PT Short Term Goals - 02/13/15 0846    PT SHORT TERM GOAL #1   Title She is independent with inital HEP/.   Time 3   Period Weeks   Status New   PT SHORT TERM GOAL #2   Title Cervical side bend improved to 35 degrees bilat with decreased pain   Time 3   Period Weeks   Status New   PT SHORT TERM GOAL #3   Title Pain decreased in RT neck /shoulder 30% or more   Time 3    Period Weeks   Status New           PT Long Term Goals - 02/13/15  88    PT LONG TERM GOAL #1   Title She will be independent with all hEP issued as of last visit   Time 6   Period Weeks   Status New   PT LONG TERM GOAL #2   Title She wil report pain as intermittant   Time 6   Period Weeks   Status New   PT LONG TERM GOAL #3   Title PAin generally reduced 75% or more with work activity   Time 6   Period Weeks   Status New   PT LONG TERM GOAL #4   Title Cervical side bend 40 degrees or more with mild pain.    Time 6   Period Weeks   Status New   PT LONG TERM GOAL #5   Title She will be able to demo understanding of good posture   Time [place] - 2015-03-07 0843    Clinical Impression Statement Ms Allred presents with onset of neck pain 2-4 weeks ago now with RT neck and shoulder pain , limited neck motion , tenderness to palpation and forward head posture.    Pt will benefit from skilled therapeutic intervention in order to improve on the following deficits Postural dysfunction;Pain;Decreased activity tolerance;Decreased range of motion;Increased muscle spasms   Rehab Potential Good   PT Frequency 2x / week   PT Duration 6 weeks   PT Treatment/Interventions Moist Heat;Traction;Ultrasound;Therapeutic activities;Therapeutic exercise;Manual techniques;Taping;Dry needling;Patient/family education;Passive range of motion   PT Next Visit Plan Mobs , STW, stretching ,posture awareness, modalities   Consulted and Agree with Plan of Care Patient          G-Codes - March 07, 2015 0849    Functional Assessment Tool Used Clinical judgement   Functional Limitation Other PT primary   Other PT Primary Current Status (S0630) At least 40 percent but less than 60 percent impaired, limited or restricted   Other PT Primary Goal Status (Z6010) At least 20 percent but less than 40 percent impaired, limited or restricted       Problem  List Patient Active Problem List   Diagnosis Date Noted  . Other chest pain 12/10/2014  . Thrombocytosis ([place]) 08/29/2014  . Medication management 08/22/2014  . Hematuria 08/22/2014  . Pain in joint, ankle and foot 05/23/2014  . Chronic anticoagulation 05/17/2014  . Back pain 04/11/2014  . Right flank pain 01/17/2014  . DVT, bilateral lower limbs ([place]) 10/08/2013  . Degenerative arthritis of left knee 10/06/2013  . Status post total knee replacement 10/06/2013  . Edema 07/28/2013  . Chronic pain 06/13/2013  . 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 Bipolar disorder ([place]) 01/19/2012  . Nephrolithiasis 02/20/2011  . Eczematous dermatitis 10/22/2010  . Iron deficiency anemia 06/27/2010  . Anxiety 06/27/2010  . Migraine 09/12/2009  . Morbid obesity-BMI 62 10/30/2008  . Osteoarthrosis, unspecified whether generalized or localized, involving lower leg 07/05/2007  . DEPRESSION, MAJOR, RECURRENT 07/01/2006  . HYPERTENSION, BENIGN SYSTEMIC 07/01/2006  . RHINITIS, ALLERGIC 07/01/2006  . GASTROESOPHAGEAL REFLUX, NO ESOPHAGITIS 07/01/2006    Darrel Hoover PT 07-Mar-2015, 8:51 AM  [facility] [address] [place], [place], [address] Phone: [phone]   Fax:  [phone]

## 2015-02-13 NOTE — Telephone Encounter (Signed)
Appointment made for pt to see Dr. Nori Riis on 02/27/2015 @ 8:45 am. Ottis Stain, CMA

## 2015-02-13 NOTE — Telephone Encounter (Signed)
Dear [name] Team Please tell her this sounds like a discussion we need to have in person, not over the phone. Have her make appt to see me--I have told pain clinic to not cancel her referral yet. THANKS! Clarise Cruz Wanda Cellucci\

## 2015-02-13 NOTE — Patient Instructions (Signed)
Removal of kineseotape if irritating skin  Or keep on for 2-3 days if not. She can shower with tape

## 2015-02-14 ENCOUNTER — Encounter: Payer: Medicare Other | Attending: Surgery | Admitting: Dietician

## 2015-02-14 ENCOUNTER — Encounter: Payer: Self-pay | Admitting: Dietician

## 2015-02-14 DIAGNOSIS — Z713 Dietary counseling and surveillance: Secondary | ICD-10-CM | POA: Diagnosis not present

## 2015-02-14 DIAGNOSIS — Z6841 Body Mass Index (BMI) 40.0 and over, adult: Secondary | ICD-10-CM | POA: Diagnosis not present

## 2015-02-14 NOTE — Patient Instructions (Addendum)
-  Work on eating at least 3 meals a day -Stick with lean meats and avoid fried foods (refer to fast food guide) -Focus on lean meat and non starchy vegetables -Try packing an insulated lunchbox with an ice pack to keep in your car (protein shakes, [employer] and Fit Greek yogurt, cheese and lunchmeat, protein chips, egg salad) -Keep up walking routine  Flintstones "complete" (chewable, not gummy) 1500 mg Calcium citrate (500 mg 3x a day) - try Celebrate calcium chews (get these at the [employer]) Vitamin B12 (sublingual)

## 2015-02-14 NOTE — Progress Notes (Signed)
  Medical Nutrition Therapy:  Appt start time: 255 end time: 310  Follow up:  Primary concerns today: Crystal Garza returns having lost 2 pounds since last visit 2 months ago. Having a lot of back pain and just started physical therapy. She has also been taking steroids (injection and by mouth) on and off for the past 1.5 months. No longer drinking sweet tea and has started having a protein shake for breakfast instead of skipping. Still avoiding drinking while eating. Crystal Garza reports frustration that she has not lost more weight as she as been really working on her eating habits.   Goal: under 300 lbs  Preferred Learning Style:   No preference indicated   Learning Readiness:   Ready   MEDICATIONS: see list   DIETARY INTAKE:  Avoided foods include hamburger/beef, hot dogs, bread.    24-hr recall:  B ( AM): Premier protein shake Snk (10 AM): P3 pack L ( PM): salad with chicken  Snk ( PM):  D ( PM): baked fish, peas, and mashed potatoes Snk ( PM): 1/2 protein shake  Beverages: water with sugar free flavoring  Usual physical activity: walks at least a mile per day when she gets home from work  Estimated energy needs: 1300-1500 calories  Progress Towards Goal(s):  In progress.   Nutritional Diagnosis:  [place]-3.4 Unintentional weight gain As related to increase in convenience foods and fast foods.  As evidenced by patient report of frequently snacking on chips, cookies, and fried foods.    Intervention:  Nutrition counseling provided. Goals: -Start having a protein shake for breakfast Nurse, mental health) -Work on eating at least 3 meals a day -Stick with lean meats and avoid fried foods (refer to fast food guide) -Focus on lean meat and non starchy vegetables -Try packing an insulated lunchbox with an ice pack to keep in your car (protein shakes, Dannon Light and Fit Greek yogurt, cheese and lunchmeat, protein chips, egg salad) -Get your slowcooker back from your daughter so you can prep meals  ahead of time -Keep up walking routine  Flintstones "complete" (chewable, not gummy) 1500 mg Calcium citrate (500 mg 3x a day) - try Celebrate calcium chews (get these at the [facility] outpatient pharmacy) Vitamin B12 (sublingual)  Teaching Method Utilized:  Visual Auditory  Barriers to learning/adherence to lifestyle change: food preferences  Demonstrated degree of understanding via:  Teach Back   Monitoring/Evaluation:  Dietary intake, exercise, and body weight in 6 week(s).

## 2015-02-15 ENCOUNTER — Other Ambulatory Visit: Payer: Self-pay | Admitting: Family Medicine

## 2015-02-27 ENCOUNTER — Encounter: Payer: Self-pay | Admitting: Family Medicine

## 2015-02-27 ENCOUNTER — Ambulatory Visit (INDEPENDENT_AMBULATORY_CARE_PROVIDER_SITE_OTHER): Payer: Medicare Other | Admitting: Family Medicine

## 2015-02-27 ENCOUNTER — Ambulatory Visit: Payer: Medicare Other

## 2015-02-27 VITALS — BP 124/88 | HR 99 | Temp 97.9°F | Ht 67.0 in | Wt 336.7 lb

## 2015-02-27 DIAGNOSIS — M436 Torticollis: Secondary | ICD-10-CM | POA: Diagnosis not present

## 2015-02-27 DIAGNOSIS — M549 Dorsalgia, unspecified: Secondary | ICD-10-CM

## 2015-02-27 DIAGNOSIS — N62 Hypertrophy of breast: Secondary | ICD-10-CM

## 2015-02-27 DIAGNOSIS — G8929 Other chronic pain: Secondary | ICD-10-CM | POA: Diagnosis not present

## 2015-02-27 DIAGNOSIS — M25519 Pain in unspecified shoulder: Secondary | ICD-10-CM | POA: Diagnosis not present

## 2015-02-27 DIAGNOSIS — M542 Cervicalgia: Secondary | ICD-10-CM

## 2015-02-27 DIAGNOSIS — R293 Abnormal posture: Secondary | ICD-10-CM

## 2015-02-27 DIAGNOSIS — R6889 Other general symptoms and signs: Secondary | ICD-10-CM | POA: Diagnosis not present

## 2015-02-27 DIAGNOSIS — M546 Pain in thoracic spine: Secondary | ICD-10-CM | POA: Diagnosis not present

## 2015-02-27 DIAGNOSIS — M545 Low back pain: Secondary | ICD-10-CM | POA: Diagnosis not present

## 2015-02-27 MED ORDER — OXYCODONE-ACETAMINOPHEN 7.5-325 MG PO TABS: ORAL_TABLET | ORAL | Status: DC

## 2015-02-27 NOTE — Assessment & Plan Note (Signed)
She's been walking at least half hour a day and actively trying to watch her diet. She's disappointed she's had no weight loss. She recently had 10 days of oral prednisone for neck and upper back pain and thinks this may have worked against her. She's quite frustrated about this. We discussed. 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

## 2015-02-27 NOTE — Assessment & Plan Note (Signed)
She was agreeable to the pain clinic until he decided they wanted to do epidural injections. At the last pain clinic she had an unfortunate incidence where she had of having to get a blood patch. She doesn't want to do any more injection therapy unless it's done by her back surgeon. The pain clinic will not take her unless they are the ones that do her injection therapy. We discussed. I thought they were talking about doing injection therapy in her knees but this is not the case. We'll continue her on her chronic pain management currently, there are no red flags. I gave her 3 months prescriptions will see her back in 3 months.

## 2015-02-27 NOTE — Progress Notes (Signed)
   Subjective:    Patient ID: Crystal Garza, female    DOB: 11-09-1970, 44 y.o.   MRN: [id number]  HPI Here to discuss her visit with the pain clinic.  They agreed to take her as a patient but only if they could do her back injections.  She wants to continue getting those through her back surgeon.  They were unwilling to do handle just her chronic pain medicines.  She is quite distressed that she is afraid I will dismiss her from our practice.  She really wants to stay at this practice because she feels like she is doing otherwise pretty well. Regarding her neck pain, she continues to have problems with that.  She is starting physical therapy.  We have briefly discussed whether or not she would consider breast reduction and after some thought, she agrees that that might be something that would benefit her. #2.  Had a follow-up with her new psychiatrist and seems to be doing really well with that.  He had increased her Lamictal and she feels much more stable from a mood standpoint. #3.  Hypertension: No chest pain or shortness of breath.  No lower extremity edema.  No problems with medicines.  She is taking them regularly.   Review of Systems See HPI.    Objective:   Physical Exam   Vital signs reviewed. GENERAL: Well-developed, well-nourished, no acute distress. CARDIOVASCULAR: Regular rate and rhythm no murmur gallop or rub LUNGS: Clear to auscultation bilaterally, no rales or wheeze. ABDOMEN: Soft positive bowel sounds NEURO: No gross focal neurological deficits. MSK: Movement of extremity x 4.Left knee has full extension but the last 10 is still somewhat slow and stiff.  Flexion is  110. Psychiatric: Alert and oriented 4.  Affect is interactive.  Good eye contact.  Neatly dressed.  Speech normal content and fluency.  Judgment is normal.       Assessment & Plan:

## 2015-02-27 NOTE — Therapy (Signed)
[place], [place], [address] Phone: [phone]   Fax:  [phone]  Physical Therapy Treatment  Patient Details  Name: Crystal Garza MRN: [id number] Date of Birth: 1971/01/27 No Data Recorded  Encounter Date: 02/27/2015      PT End of Session - 02/27/15 7902    Visit Number 2   Number of Visits 12   Date for PT Re-Evaluation 03/27/15   PT Start Time 0802   PT Stop Time 0850   PT Time Calculation (min) 48 min   Activity Tolerance Patient tolerated treatment well;Patient limited by pain   Behavior During Therapy [facility] for tasks assessed/performed      Past Medical History  Diagnosis Date  . Anemia     iron def  . Hypertension   . Morbid obesity ([place])   . GERD (gastroesophageal reflux disease)   . Anxiety   . Depression   . Migraine   . Back pain   . Chronic knee pain   . Arthritis     left knee   . Hx of laparoscopic gastric banding   . Anginal pain ([place])   . Kidney stones   . DVT (deep venous thrombosis) ([place])   . PE (pulmonary embolism)     Past Surgical History  Procedure Laterality Date  . Abdominal hysterectomy    . Knee arthroscopy    . Cesarean section  05/1991  . Breath tek h pylori  07/28/2011    Procedure: BREATH TEK H PYLORI;  Surgeon: [name], MD;  Location: [facility];  Service: General;  Laterality: N/A;  to be done at 745  . Esophageal biopsy  02/16/2012    Procedure: BIOPSY;  Surgeon: [name], MD;  Location: [facility];  Service: General;;  biopsy of mass x 2  . Eus  03/03/2012    Procedure: UPPER ENDOSCOPIC ULTRASOUND (EUS) LINEAR;  Surgeon: Milus Banister, MD;  Location: [facility];  Service: Endoscopy;  Laterality: N/A;  . Tubal ligation    . Laparoscopic gastrectomy  04/08/2012    Procedure: LAPAROSCOPIC GASTRECTOMY;  Surgeon: [name], MD;  Location: [facility];  Service: General;;  removal of GIST tumor of stomach  . Laparoscopic gastric  sleeve resection N/A 08/29/2012    Procedure: LAPAROSCOPIC GASTRIC SLEEVE RESECTION;  Surgeon: [name], MD;  [facility];  Service: General;  Laterality: N/A;  Sleeve Gastrectomy  . Esophagogastroduodenoscopy N/A 08/29/2012    Procedure: ESOPHAGOGASTRODUODENOSCOPY (EGD);  Surgeon: [name], MD;  [facility];  Service: General;  Laterality: N/A;  . Total knee arthroplasty Left 10/06/2013    Procedure: LEFT TOTAL KNEE ARTHROPLASTY;  Surgeon: Mcarthur Rossetti, MD;  Location: [facility];  Service: Orthopedics;  Laterality: Left;  . Esophagogastroduodenoscopy N/A 09/04/2014    Procedure: ESOPHAGOGASTRODUODENOSCOPY (EGD);  Surgeon: Alphonsa Overall, MD;  Location: [facility];  Service: General;  Laterality: N/A;    There were no vitals filed for this visit.  Visit Diagnosis:  Stiffness of neck  Neck and shoulder pain  Abnormal posture      Subjective Assessment - 02/27/15 0807    Subjective No difference with neck pain. Feel worse rather than better. Going to MD today   Currently in Pain? Yes   Pain Score 8    Pain Location Neck   Pain Orientation Right;Anterior;Posterior   Pain Descriptors / Indicators Throbbing   Pain Type Acute pain   Pain Onset More than a month  ago   Pain Frequency Constant   Pain Relieving Factors Nothing   Multiple Pain Sites No            [facility] PT Assessment - 02/27/15 0001    AROM   Cervical - Right Side Bend 20   Cervical - Left Side Bend 25   Cervical - Right Rotation 45   Cervical - Left Rotation 54                     [facility] Adult PT Treatment/Exercise - 02/27/15 0834    Neck Exercises: Seated   Other Seated Exercise demo neck stretching with minor cues for technique   Moist Heat Therapy   Number Minutes Moist Heat 10 Minutes   Moist Heat Location Cervical   Ultrasound   Ultrasound Location eck LT >RT    Ultrasound Parameters 100%, 1.6 Wcm2, 1MHz   Ultrasound Goals Pain   Manual Therapy   Manual Therapy  Joint mobilization;Soft tissue mobilization;Manual Traction;Passive ROM   Joint Mobilization PA glides GR 2 C2 to C7    Soft tissue mobilization with roick tool RT > LT  with trigger Pt release RT trap with gentle pressure and  small neck movements in siddebending.   Passive ROM Neck sidebend and rotation RT and Lt , , PROM WNL except RT sidebending stil decreased and painful   Manual Traction With cervical retraction                   PT Short Term Goals - 02/27/15 0841    PT SHORT TERM GOAL #1   Title She is independent with inital HEP/.   Status Achieved   PT SHORT TERM GOAL #2   Title Cervical side bend improved to 35 degrees bilat with decreased pain   Status On-going   PT SHORT TERM GOAL #3   Title Pain decreased in RT neck /shoulder 30% or more   Status On-going           PT Long Term Goals - 02/13/15 0847    PT LONG TERM GOAL #1   Title She will be independent with all hEP issued as of last visit   Time 6   Period Weeks   Status New   PT LONG TERM GOAL #2   Title She wil report pain as intermittant   Time 6   Period Weeks   Status New   PT LONG TERM GOAL #3   Title PAin generally reduced 75% or more with work activity   Time 6   Period Weeks   Status New   PT LONG TERM GOAL #4   Title Cervical side bend 40 degrees or more with mild pain.    Time 6   Period Weeks   Status New   PT LONG TERM GOAL #5   Title She will be able to demo understanding of good posture   Time 6   Period Weeks   Status New               Plan - 02/27/15 0840    Clinical Impression Statement Ms Allred reports worse since eval and neck AROM is worse.  She will see MD today and we will plan to see per plan for 6 visits more and if improveing continue and if not discharge to MD. She appears to be doing her HEP   PT Next Visit Plan Mobs , STW, stretching ,posture awareness, modalities   Consulted and Agree  with Plan of Care Patient        Problem List Patient  Active Problem List   Diagnosis Date Noted  . Other chest pain 12/10/2014  . Thrombocytosis ([place]) 08/29/2014  . Medication management 08/22/2014  . Hematuria 08/22/2014  . Pain in joint, ankle and foot 05/23/2014  . Chronic anticoagulation 05/17/2014  . Back pain 04/11/2014  . Right flank pain 01/17/2014  . DVT, bilateral lower limbs ([place]) 10/08/2013  . Degenerative arthritis of left knee 10/06/2013  . Status post total knee replacement 10/06/2013  . Edema 07/28/2013  . Chronic pain 06/13/2013  . 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 Bipolar disorder ([place]) 01/19/2012  . Nephrolithiasis 02/20/2011  . Eczematous dermatitis 10/22/2010  . Iron deficiency anemia 06/27/2010  . Anxiety 06/27/2010  . Migraine 09/12/2009  . Morbid obesity-BMI 62 10/30/2008  . Osteoarthrosis, unspecified whether generalized or localized, involving lower leg 07/05/2007  . DEPRESSION, MAJOR, RECURRENT 07/01/2006  . HYPERTENSION, BENIGN SYSTEMIC 07/01/2006  . RHINITIS, ALLERGIC 07/01/2006  . GASTROESOPHAGEAL REFLUX, NO ESOPHAGITIS 07/01/2006    Darrel Hoover PT 02/27/2015, 8:55 AM  [facility] [address] [place], [place], [address] Phone: [phone]   Fax:  [phone]  Name: EMRY TOBIN MRN: [id number] Date of Birth: 02-20-1971

## 2015-02-27 NOTE — Assessment & Plan Note (Signed)
Referral to plastic surgery for evaluation for breast reduction. Continue physical therapy.

## 2015-02-27 NOTE — Assessment & Plan Note (Signed)
I think the majority of her upper back and neck pain is related to her macromastia. She has been actively trying to lose weight without [place] success. She had her knee replacement done so she could do some more walking recently has had a lot of upper back and neck pain. Her physical therapist is working with her as is her back Psychologist, sport and exercise who has been doing some injection therapy for her. I think at this point we need to consider evaluation for breast reduction. I will set up referral.

## 2015-03-01 ENCOUNTER — Ambulatory Visit: Payer: Medicare Other | Admitting: Physical Therapy

## 2015-03-01 DIAGNOSIS — R293 Abnormal posture: Secondary | ICD-10-CM | POA: Diagnosis not present

## 2015-03-01 DIAGNOSIS — M436 Torticollis: Secondary | ICD-10-CM

## 2015-03-01 DIAGNOSIS — M542 Cervicalgia: Secondary | ICD-10-CM | POA: Diagnosis not present

## 2015-03-01 DIAGNOSIS — M25519 Pain in unspecified shoulder: Secondary | ICD-10-CM | POA: Diagnosis not present

## 2015-03-01 DIAGNOSIS — R6889 Other general symptoms and signs: Secondary | ICD-10-CM | POA: Diagnosis not present

## 2015-03-01 DIAGNOSIS — M545 Low back pain, unspecified: Secondary | ICD-10-CM

## 2015-03-01 NOTE — Therapy (Signed)
Crystal Garza, [place], [address] Phone: [phone]   Fax:  [phone]  Physical Therapy Treatment  Patient Details  Name: Crystal Garza MRN: [id number] Date of Birth: [birth year] No Data Recorded  Encounter Date: 03/01/2015      PT End of Session - 03/01/15 0843    Visit Number 3   Number of Visits 12   Date for PT Re-Evaluation 03/27/15   PT Start Time 0810  pt arrived 10 minutes late   PT Stop Time 0855   PT Time Calculation (min) 45 min   Activity Tolerance Patient tolerated treatment well   Behavior During Therapy [facility] for tasks assessed/performed      Past Medical History  Diagnosis Date  . Anemia     iron def  . Hypertension   . Morbid obesity ([place])   . GERD (gastroesophageal reflux disease)   . Anxiety   . Depression   . Migraine   . Back pain   . Chronic knee pain   . Arthritis     left knee   . Hx of laparoscopic gastric banding   . Anginal pain ([place])   . Kidney stones   . DVT (deep venous thrombosis) ([place])   . PE (pulmonary embolism)     Past Surgical History  Procedure Laterality Date  . Abdominal hysterectomy    . Knee arthroscopy    . Cesarean section  05/1991  . Breath tek h pylori  07/28/2011    Procedure: BREATH TEK H PYLORI;  Surgeon: [name], MD;  Location: [facility];  Service: General;  Laterality: N/A;  to be done at 745  . Esophageal biopsy  02/16/2012    Procedure: BIOPSY;  Surgeon: [name], MD;  [facility];  Service: General;;  biopsy of mass x 2  . Eus  03/03/2012    Procedure: UPPER ENDOSCOPIC ULTRASOUND (EUS) LINEAR;  Surgeon: Milus Banister, MD;  [facility];  Service: Endoscopy;  Laterality: N/A;  . Tubal ligation    . Laparoscopic gastrectomy  04/08/2012    Procedure: LAPAROSCOPIC GASTRECTOMY;  Surgeon: [name], MD;  [facility];  Service: General;;  removal of GIST tumor of stomach  . Laparoscopic  gastric sleeve resection N/A 08/29/2012    Procedure: LAPAROSCOPIC GASTRIC SLEEVE RESECTION;  Surgeon: [name], MD;  [facility];  Service: General;  Laterality: N/A;  Sleeve Gastrectomy  . Esophagogastroduodenoscopy N/A 08/29/2012    Procedure: ESOPHAGOGASTRODUODENOSCOPY (EGD);  Surgeon: [name], MD;  [facility];  Service: General;  Laterality: N/A;  . Total knee arthroplasty Left 10/06/2013    Procedure: LEFT TOTAL KNEE ARTHROPLASTY;  Surgeon: Mcarthur Rossetti, MD;  [facility];  Service: Orthopedics;  Laterality: Left;  . Esophagogastroduodenoscopy N/A 09/04/2014    Procedure: ESOPHAGOGASTRODUODENOSCOPY (EGD);  Surgeon: Alphonsa Overall, MD;  Location: [facility];  Service: General;  Laterality: N/A;    There were no vitals filed for this visit.  Visit Diagnosis:  Stiffness of neck  Neck and shoulder pain  Abnormal posture  Activity intolerance  Bilateral low back pain without sciatica      Subjective Assessment - 03/01/15 0808    Subjective "I've still been having about the same pain inthe shoulder/ neck"    Currently in Pain? Yes   Pain Score 6    Pain Location Neck   Pain Orientation Right;Anterior;Posterior   Pain Descriptors / Indicators Throbbing   Pain  Type Chronic pain   Pain Onset More than a month ago   Pain Frequency Constant   Aggravating Factors  working tasks   Pain Relieving Factors nothing gives relief                         [facility] Adult PT Treatment/Exercise - 03/01/15 0816    Neck Exercises: Machines for Strengthening   UBE (Upper Arm Bike) NuSTep L5 x 5 min   Neck Exercises: Supine   Neck Retraction 10 reps;5 secs   Other Supine Exercise bil scapualr retraction 2 x 10   red theraband   Moist Heat Therapy   Number Minutes Moist Heat 10 Minutes   Moist Heat Location Cervical  in supine   Manual Therapy   Joint Mobilization PA glides GR 2 C2 to C7    Soft tissue mobilization instrument assisted STM  over the R upper trap and levator   Neck Exercises: Stretches   Upper Trapezius Stretch 2 reps;30 seconds   Levator Stretch 2 reps;30 seconds          Trigger Point Dry Needling - 03/01/15 0824    Consent Given? Yes   Education Handout Provided Yes   Muscles Treated Upper Body Upper trapezius;Levator scapulae   Upper Trapezius Response Twitch reponse elicited;Palpable increased muscle length   Levator Scapulae Response Palpable increased muscle length;Twitch response elicited              PT Education - 03/01/15 0843    Education provided Yes   Education Details dry needling education    Person(s) Educated Patient   Methods Explanation;Handout   Comprehension Verbalized understanding          PT Short Term Goals - 02/27/15 0841    PT SHORT TERM GOAL #1   Title She is independent with inital HEP/.   Status Achieved   PT SHORT TERM GOAL #2   Title Cervical side bend improved to 35 degrees bilat with decreased pain   Status On-going   PT SHORT TERM GOAL #3   Title Pain decreased in RT neck /shoulder 30% or more   Status On-going           PT Long Term Goals - 02/13/15 0847    PT LONG TERM GOAL #1   Title She will be independent with all hEP issued as of last visit   Time 6   Period Weeks   Status New   PT LONG TERM GOAL #2   Title She wil report pain as intermittant   Time 6   Period Weeks   Status New   PT LONG TERM GOAL #3   Title PAin generally reduced 75% or more with work activity   Time 6   Period Weeks   Status New   PT LONG TERM GOAL #4   Title Cervical side bend 40 degrees or more with mild pain.    Time 6   Period Weeks   Status New   PT LONG TERM GOAL #5   Title She will be able to demo understanding of good posture   Time 6   Period Weeks   Status New               Plan - 03/01/15 0844    Clinical Impression Statement Ms Allred reports some relief since the last visit but reports to have pain. educated about dry needling  and pt provided consent needling was provided for  needling on the R upper trap and levator muscles multple twitches were elicited and lengthening was noted  with decreased tensions. She was able to peform exercises following with decreased tension and no complaint of increased pain. opted for heat to calm down soreness following todays session which she reported relief of pain to a 3/10.    PT Next Visit Plan Mobs , STW, stretching ,posture awareness, modalities, assess response to dry needling.    Consulted and Agree with Plan of Care Patient        Problem List Patient Active Problem List   Diagnosis Date Noted  . 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 Chronic neck and back pain 02/27/2015  . Other chest pain 12/10/2014  . Thrombocytosis ([place]) 08/29/2014  . Medication management 08/22/2014  . Hematuria 08/22/2014  . Pain in joint, ankle and foot 05/23/2014  . Chronic anticoagulation 05/17/2014  . Chronic thoracic back pain 04/11/2014  . Right flank pain 01/17/2014  . DVT, bilateral lower limbs ([place]) 10/08/2013  . Degenerative arthritis of left knee 10/06/2013  . Status post total knee replacement 10/06/2013  . Edema 07/28/2013  . Chronic pain 06/13/2013  . 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 Bipolar disorder ([place]) 01/19/2012  . Nephrolithiasis 02/20/2011  . Eczematous dermatitis 10/22/2010  . Iron deficiency anemia 06/27/2010  . Anxiety 06/27/2010  . Migraine 09/12/2009  . Morbid obesity-BMI 62 10/30/2008  . Osteoarthrosis, unspecified whether generalized or localized, involving lower leg 07/05/2007  . DEPRESSION, MAJOR, RECURRENT 07/01/2006  . HYPERTENSION, BENIGN SYSTEMIC 07/01/2006  . RHINITIS, ALLERGIC 07/01/2006  . GASTROESOPHAGEAL REFLUX, NO ESOPHAGITIS 07/01/2006   Starr Lake PT, DPT, LAT, ATC  03/01/2015  8:54 AM    [facility] [address] [place], [place], [address] Phone: [phone]   Fax:  [phone]  Name: LETTA CARGILE MRN: [id number] Date of Birth: [birth year]

## 2015-03-05 ENCOUNTER — Ambulatory Visit: Payer: Medicare Other | Attending: Sports Medicine | Admitting: Physical Therapy

## 2015-03-05 DIAGNOSIS — M545 Low back pain, unspecified: Secondary | ICD-10-CM

## 2015-03-05 DIAGNOSIS — R293 Abnormal posture: Secondary | ICD-10-CM | POA: Diagnosis present

## 2015-03-05 DIAGNOSIS — M25519 Pain in unspecified shoulder: Secondary | ICD-10-CM | POA: Diagnosis present

## 2015-03-05 DIAGNOSIS — M542 Cervicalgia: Secondary | ICD-10-CM | POA: Insufficient documentation

## 2015-03-05 DIAGNOSIS — R6889 Other general symptoms and signs: Secondary | ICD-10-CM | POA: Diagnosis present

## 2015-03-05 DIAGNOSIS — M436 Torticollis: Secondary | ICD-10-CM | POA: Diagnosis present

## 2015-03-05 NOTE — Patient Instructions (Signed)
Levator Stretch   Grasp seat or sit on hand on side to be stretched. Turn head toward other side and look down. Use hand on head to gently stretch neck in that position. Hold _30___ seconds. Repeat on other side. Repeat 2-3____ times. Do 2-3____ sessions per day.  http://gt2.exer.us/30   Copyright  VHI. All rights reserved.  Side-Bending   One hand on opposite side of head, pull head to side as far as is comfortable. Stop if there is pain. Hold _30___ seconds. Repeat with other hand to other side. Repeat _2-3___ times. Do _2-3___ sessions per day.   Over Head Pull: Narrow Grip       On back, knees bent, feet flat, band across thighs, elbows straight but relaxed. Pull hands apart (start). Keeping elbows straight, bring arms up and over head, hands toward floor. Keep pull steady on band. Hold momentarily. Return slowly, keeping pull steady, back to start. Repeat _10__ times. Band color red______   Side Pull: Double Arm   On back, knees bent, feet flat. Arms perpendicular to body, shoulder level, elbows straight but relaxed. Pull arms out to sides, elbows straight. Resistance band comes across collarbones, hands toward floor. Hold momentarily. Slowly return to starting position. Repeat _10__ times. Band color red____   Sash   On back, knees bent, feet flat, left hand on left hip, right hand above left. Pull right arm DIAGONALLY (hip to shoulder) across chest. Bring right arm along head toward floor. Hold momentarily. Slowly return to starting position. Repeat _10__ times. Do with left arm. Band color red_____   Shoulder Rotation: Double Arm   On back, knees bent, feet flat, elbows tucked at sides, bent 90, hands palms up. Pull hands apart and down toward floor, keeping elbows near sides. Hold momentarily. Slowly return to starting position. Repeat 10___ times. Band color red______   Voncille Lo, PT 03/05/2015 8:15 AM Phone: [phone] Fax: [phone]

## 2015-03-05 NOTE — Therapy (Signed)
Crystal Garza, [place], [address] Phone: [phone]   Fax:  [phone]  Physical Therapy Treatment  Patient Details  Name: Crystal Garza MRN: [id number] Date of Birth: 27-Dec-1970 No Data Recorded  Encounter Date: 03/05/2015      PT End of Session - 03/05/15 0820    Visit Number 4   Number of Visits 12   Date for PT Re-Evaluation 03/27/15   PT Start Time 0804   PT Stop Time 0900   PT Time Calculation (min) 56 min   Activity Tolerance Patient tolerated treatment well   Behavior During Therapy [facility] [address] for tasks assessed/performed      Past Medical History  Diagnosis Date  . Anemia     iron def  . Hypertension   . Morbid obesity ([place])   . GERD (gastroesophageal reflux disease)   . Anxiety   . Depression   . Migraine   . Back pain   . Chronic knee pain   . Arthritis     left knee   . Hx of laparoscopic gastric banding   . Anginal pain ([place])   . Kidney stones   . DVT (deep venous thrombosis) ([place])   . PE (pulmonary embolism)     Past Surgical History  Procedure Laterality Date  . Abdominal hysterectomy    . Knee arthroscopy    . Cesarean section  05/1991  . Breath tek h pylori  07/28/2011    Procedure: BREATH TEK H PYLORI;  Surgeon: [name], MD;  Location: [facility];  Service: General;  Laterality: N/A;  to be done at 745  . Esophageal biopsy  02/16/2012    Procedure: BIOPSY;  Surgeon: [name], MD;  [facility]: [facility];  Service: General;;  biopsy of mass x 2  . Eus  03/03/2012    Procedure: UPPER ENDOSCOPIC ULTRASOUND (EUS) LINEAR;  Surgeon: Milus Banister, MD;  [facility];  Service: Endoscopy;  Laterality: N/A;  . Tubal ligation    . Laparoscopic gastrectomy  04/08/2012    Procedure: LAPAROSCOPIC GASTRECTOMY;  Surgeon: [name], MD;  [facility];  Service: General;;  removal of GIST tumor of stomach  . Laparoscopic gastric sleeve resection N/A  08/29/2012    Procedure: LAPAROSCOPIC GASTRIC SLEEVE RESECTION;  Surgeon: [name], MD;  [facility];  Service: General;  Laterality: N/A;  Sleeve Gastrectomy  . Esophagogastroduodenoscopy N/A 08/29/2012    Procedure: ESOPHAGOGASTRODUODENOSCOPY (EGD);  Surgeon: [name], MD;  [facility];  Service: General;  Laterality: N/A;  . Total knee arthroplasty Left 10/06/2013    Procedure: LEFT TOTAL KNEE ARTHROPLASTY;  Surgeon: Mcarthur Rossetti, MD;  [facility];  Service: Orthopedics;  Laterality: Left;  . Esophagogastroduodenoscopy N/A 09/04/2014    Procedure: ESOPHAGOGASTRODUODENOSCOPY (EGD);  Surgeon: Alphonsa Overall, MD;  Location: [facility];  Service: General;  Laterality: N/A;    There were no vitals filed for this visit.  Visit Diagnosis:  Stiffness of neck  Neck and shoulder pain  Abnormal posture  Activity intolerance  Bilateral low back pain without sciatica      Subjective Assessment - 03/05/15 0806    Subjective I did a lot this weekend and so I am hurting but I am better than wehn I first started last week.  I havent had to use the [facility]    Diagnostic tests x rays negative   Patient Stated Goals Releive pain for more mobility  Currently in Pain? Yes   Pain Score 5    Pain Location Neck   Pain Orientation Right;Anterior;Posterior   Pain Descriptors / Indicators Tightness;Sore   Pain Type Chronic pain   Pain Onset More than a month ago   Pain Frequency Intermittent            [facility] PT Assessment - 03/05/15 0809    AROM   Cervical Flexion 50   Cervical Extension 50  ERP   Cervical - Right Side Bend 40   Cervical - Left Side Bend 30   Cervical - Right Rotation 45   Cervical - Left Rotation 54   Palpation   Palpation comment Right T-4 to T-8 paraspinal more posterior and reduced after mobs                     [facility] Adult PT Treatment/Exercise - 03/05/15 0809    Neck Exercises: Supine   Neck Retraction 10 reps   chin tuck with towel roll   Other Supine Exercise supine scapular series with flex , abd  diagonals and ER x 10 red t band   Moist Heat Therapy   Number Minutes Moist Heat 15 Minutes   Moist Heat Location Cervical   Manual Therapy   Manual Therapy Joint mobilization;Soft tissue mobilization   Joint Mobilization  lateral PA glides GR 3C2 to C7. Right facet mob T-6/7  T 8/9 R     Soft tissue mobilization instrument assisted STM over the R upper trap and levator   Neck Exercises: Stretches   Upper Trapezius Stretch 2 reps;30 seconds   Levator Stretch 2 reps;30 seconds          Trigger Point Dry Needling - 03/05/15 0823    Consent Given? Yes   Education Handout Provided No  previously given   Muscles Treated Upper Body Upper trapezius;Levator scapulae  bil with all dry needling, Right T-4 to T-6, T-9/T-10    Upper Trapezius Response Twitch reponse elicited;Palpable increased muscle length   Levator Scapulae Response Twitch response elicited;Palpable increased muscle length              PT Education - 03/05/15 0816    Education provided Yes   Education Details HEP with supine scapular series with red t band   Person(s) Educated Patient   Methods Explanation;Demonstration;Tactile cues;Verbal cues;Handout   Comprehension Verbalized understanding;Returned demonstration          PT Short Term Goals - 03/05/15 4098    PT SHORT TERM GOAL #1   Title She is independent with inital HEP/.   Time 3   Period Weeks   Status Achieved   PT SHORT TERM GOAL #2   Title Cervical side bend improved to 35 degrees bilat with decreased pain   Baseline Right side bend 40 and left side bend 30 today   Time 3   Period Weeks   Status On-going   PT SHORT TERM GOAL #3   Title Pain decreased in RT neck /shoulder 30% or more   Time 3   Period Weeks   Status On-going           PT Long Term Goals - 03/05/15 1191    PT LONG TERM GOAL #1   Title She will be independent with all hEP  issued as of last visit   Time 6   Period Weeks   Status On-going   PT LONG TERM GOAL #2   Title She wil report pain  as intermittant   Time 6   Period Weeks   Status On-going   PT LONG TERM GOAL #3   Title PAin generally reduced 75% or more with work activity   Time 6   Period Weeks   Status On-going   PT LONG TERM GOAL #4   Title Cervical side bend 40 degrees or more with mild pain.    Time 6   Period Weeks   Status On-going   PT LONG TERM GOAL #5   Title She will be able to demo understanding of good posture   Time 6   Period Weeks   Status On-going               Plan - 03/05/15 0820    Clinical Impression Statement Ms Allred reports 5/10 pain increased after busy weekend.  Pt posture/ HEP reinforced and HEP added to for scapular strength.  Pt had T-4 to T-8 more posteror and responded to UPA mobs or R transverse processes with more comfortable trunk rotation.   Pt cervical rotation improved after TDN cervical flex 50, Ext 50 with painERP and Right side bend 40 and Left side bend slightly improved 30 .  Pt was able to perform exercises with 4/10 pain imporvement.  Pt with increased cervical motion afterer treatment. Pt reporst 0-1/10 pain after mobilzation and TDN.    Pt will benefit from skilled therapeutic intervention in order to improve on the following deficits Postural dysfunction;Pain;Decreased activity tolerance;Decreased range of motion;Increased muscle spasms   Rehab Potential Good   PT Frequency 2x / week   PT Duration 6 weeks   PT Treatment/Interventions Moist Heat;Traction;Ultrasound;Therapeutic activities;Therapeutic exercise;Manual techniques;Taping;Dry needling;Patient/family education;Passive range of motion   PT Next Visit Plan mobs, STW, review supine scap exercises , assess response to dry needing.   PT Home Exercise Plan HEP supine scap exercise, chin tuck with towel   Consulted and Agree with Plan of Care Patient        Problem List Patient  Active Problem List   Diagnosis Date Noted  . 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 Chronic neck and back pain 02/27/2015  . Other chest pain 12/10/2014  . Thrombocytosis ([place]) 08/29/2014  . Medication management 08/22/2014  . Hematuria 08/22/2014  . Pain in joint, ankle and foot 05/23/2014  . Chronic anticoagulation 05/17/2014  . Chronic thoracic back pain 04/11/2014  . Right flank pain 01/17/2014  . DVT, bilateral lower limbs ([place]) 10/08/2013  . Degenerative arthritis of left knee 10/06/2013  . Status post total knee replacement 10/06/2013  . Edema 07/28/2013  . Chronic pain 06/13/2013  . 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 Bipolar disorder ([place]) 01/19/2012  . Nephrolithiasis 02/20/2011  . Eczematous dermatitis 10/22/2010  . Iron deficiency anemia 06/27/2010  . Anxiety 06/27/2010  . Migraine 09/12/2009  . Morbid obesity-BMI 62 10/30/2008  . Osteoarthrosis, unspecified whether generalized or localized, involving lower leg 07/05/2007  . DEPRESSION, MAJOR, RECURRENT 07/01/2006  . HYPERTENSION, BENIGN SYSTEMIC 07/01/2006  . RHINITIS, ALLERGIC 07/01/2006  . GASTROESOPHAGEAL REFLUX, NO ESOPHAGITIS 07/01/2006   Voncille Lo, PT 03/05/2015 9:03 AM Phone: [phone] Fax: [facility] [address] [place], [place], [address] Phone: [phone]   Fax:  [phone]  Name: WILLADEAN GUYTON MRN: [id number] Date of Birth: May 05, 1970

## 2015-03-07 ENCOUNTER — Ambulatory Visit: Payer: Medicare Other | Admitting: Physical Therapy

## 2015-03-07 DIAGNOSIS — M436 Torticollis: Secondary | ICD-10-CM | POA: Diagnosis not present

## 2015-03-07 DIAGNOSIS — R6889 Other general symptoms and signs: Secondary | ICD-10-CM

## 2015-03-07 DIAGNOSIS — M545 Low back pain, unspecified: Secondary | ICD-10-CM

## 2015-03-07 DIAGNOSIS — R293 Abnormal posture: Secondary | ICD-10-CM

## 2015-03-07 DIAGNOSIS — M25519 Pain in unspecified shoulder: Secondary | ICD-10-CM | POA: Diagnosis not present

## 2015-03-07 DIAGNOSIS — M542 Cervicalgia: Secondary | ICD-10-CM | POA: Diagnosis not present

## 2015-03-07 NOTE — Therapy (Signed)
[place], [place], [address] Phone: [phone]   Fax:  [phone]  Physical Therapy Treatment  Patient Details  Name: Crystal Garza MRN: [id number] Date of Birth: 07-24-1970 No Data Recorded  Encounter Date: 03/07/2015      PT End of Session - 03/07/15 1338    Visit Number 5   Number of Visits 12   Date for PT Re-Evaluation 03/27/15   PT Start Time 0131   PT Stop Time 0234   PT Time Calculation (min) 63 min      Past Medical History  Diagnosis Date  . Anemia     iron def  . Hypertension   . Morbid obesity ([place])   . GERD (gastroesophageal reflux disease)   . Anxiety   . Depression   . Migraine   . Back pain   . Chronic knee pain   . Arthritis     left knee   . Hx of laparoscopic gastric banding   . Anginal pain ([place])   . Kidney stones   . DVT (deep venous thrombosis) ([place])   . PE (pulmonary embolism)     Past Surgical History  Procedure Laterality Date  . Abdominal hysterectomy    . Knee arthroscopy    . Cesarean section  05/1991  . Breath tek h pylori  07/28/2011    Procedure: BREATH TEK H PYLORI;  Surgeon: [name], MD;  Location: [facility];  Service: General;  Laterality: N/A;  to be done at 745  . Esophageal biopsy  02/16/2012    Procedure: BIOPSY;  Surgeon: [name], MD;  [facility];  Service: General;;  biopsy of mass x 2  . Eus  03/03/2012    Procedure: UPPER ENDOSCOPIC ULTRASOUND (EUS) LINEAR;  Surgeon: Milus Banister, MD;  [facility];  Service: Endoscopy;  Laterality: N/A;  . Tubal ligation    . Laparoscopic gastrectomy  04/08/2012    Procedure: LAPAROSCOPIC GASTRECTOMY;  Surgeon: [name], MD;  [facility];  Service: General;;  removal of GIST tumor of stomach  . Laparoscopic gastric sleeve resection N/A 08/29/2012    Procedure: LAPAROSCOPIC GASTRIC SLEEVE RESECTION;  Surgeon: [name], MD;  [facility];   Service: General;  Laterality: N/A;  Sleeve Gastrectomy  . Esophagogastroduodenoscopy N/A 08/29/2012    Procedure: ESOPHAGOGASTRODUODENOSCOPY (EGD);  Surgeon: [name], MD;  [facility];  Service: General;  Laterality: N/A;  . Total knee arthroplasty Left 10/06/2013    Procedure: LEFT TOTAL KNEE ARTHROPLASTY;  Surgeon: Mcarthur Rossetti, MD;  [facility];  Service: Orthopedics;  Laterality: Left;  . Esophagogastroduodenoscopy N/A 09/04/2014    Procedure: ESOPHAGOGASTRODUODENOSCOPY (EGD);  Surgeon: Alphonsa Overall, MD;  Location: [facility];  Service: General;  Laterality: N/A;    There were no vitals filed for this visit.  Visit Diagnosis:  Stiffness of neck  Neck and shoulder pain  Abnormal posture  Activity intolerance  Bilateral low back pain without sciatica      Subjective Assessment - 03/07/15 1333    Subjective The dry needling helped a lot.    Currently in Pain? Yes   Pain Score 4    Pain Location Neck  and upper back    Pain Orientation Right   Pain Descriptors / Indicators --  stiff            [facility] PT Assessment - 03/07/15 1336    AROM   Cervical -  Right Side Bend 42   Cervical - Left Side Bend 32   Cervical - Right Rotation 60   Cervical - Left Rotation 75                     [facility] Adult PT Treatment/Exercise - 03/07/15 0001    Neck Exercises: Machines for Strengthening   UBE (Upper Arm Bike) UBE Level  1  73min forward, 3 min back   Neck Exercises: Theraband   Rows 20 reps;Red   Neck Exercises: Supine   Other Supine Exercise supine scapular series with flex , abd  diagonals and ER x 10 red t band, narrow grip pullovers   Moist Heat Therapy   Number Minutes Moist Heat 15 Minutes   Moist Heat Location Cervical   Manual Therapy   Soft tissue mobilization Passive stretching for cervical motions and sodt tissue work to traps                  PT Short Term Goals - 03/07/15 1358    PT SHORT TERM GOAL #1    Title She is independent with inital HEP/.   Time 3   Period Weeks   Status Achieved   PT SHORT TERM GOAL #2   Title Cervical side bend improved to 35 degrees bilat with decreased pain   Time 3   Period Weeks   Status Achieved   PT SHORT TERM GOAL #3   Title Pain decreased in RT neck /shoulder 30% or more   Time 3   Period Weeks   Status Achieved           PT Long Term Goals - 03/05/15 0102    PT LONG TERM GOAL #1   Title She will be independent with all hEP issued as of last visit   Time 6   Period Weeks   Status On-going   PT LONG TERM GOAL #2   Title She wil report pain as intermittant   Time 6   Period Weeks   Status On-going   PT LONG TERM GOAL #3   Title PAin generally reduced 75% or more with work activity   Time 6   Period Weeks   Status On-going   PT LONG TERM GOAL #4   Title Cervical side bend 40 degrees or more with mild pain.    Time 6   Period Weeks   Status On-going   PT LONG TERM GOAL #5   Title She will be able to demo understanding of good posture   Time 6   Period Weeks   Status On-going               Plan - 03/07/15 1351    Clinical Impression Statement pt reports desire to have additional PT for her left knee. She is S/p knee replacement and is lacking enough ROM to ride a bicycle for exercise. She plane to call her MD and ask for a referral to add her knee. She reports improvement after dry needling and demonstrates improved cervical AROM left >right. Began UBE, instructed in standing rows as well as review of supine HEP. Passive cervical stretching and soft tissue work today to increase ROM. She reports overall 30% decrease in neck pain thus far.    PT Next Visit Plan progress to standing scap exercises as tolerated, check for knee referral and assess knee, continue manual and dry needling as needed.         Problem List  Patient Active Problem List   Diagnosis Date Noted  . 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 Chronic neck and back pain  02/27/2015  . Other chest pain 12/10/2014  . Thrombocytosis ([place]) 08/29/2014  . Medication management 08/22/2014  . Hematuria 08/22/2014  . Pain in joint, ankle and foot 05/23/2014  . Chronic anticoagulation 05/17/2014  . Chronic thoracic back pain 04/11/2014  . Right flank pain 01/17/2014  . DVT, bilateral lower limbs ([place]) 10/08/2013  . Degenerative arthritis of left knee 10/06/2013  . Status post total knee replacement 10/06/2013  . Edema 07/28/2013  . Chronic pain 06/13/2013  . 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 Bipolar disorder ([place]) 01/19/2012  . Nephrolithiasis 02/20/2011  . Eczematous dermatitis 10/22/2010  . Iron deficiency anemia 06/27/2010  . Anxiety 06/27/2010  . Migraine 09/12/2009  . Morbid obesity-BMI 62 10/30/2008  . Osteoarthrosis, unspecified whether generalized or localized, involving lower leg 07/05/2007  . DEPRESSION, MAJOR, RECURRENT 07/01/2006  . HYPERTENSION, BENIGN SYSTEMIC 07/01/2006  . RHINITIS, ALLERGIC 07/01/2006  . GASTROESOPHAGEAL REFLUX, NO ESOPHAGITIS 07/01/2006    Dorene Ar, PTA 03/07/2015, 2:38 PM  [facility] [address] [place], [place], [address] Phone: [phone]   Fax:  [phone]  Name: Crystal Garza MRN: [id number] Date of Birth: 1971-04-15

## 2015-03-12 ENCOUNTER — Ambulatory Visit: Payer: Medicare Other

## 2015-03-12 DIAGNOSIS — R293 Abnormal posture: Secondary | ICD-10-CM | POA: Diagnosis not present

## 2015-03-12 DIAGNOSIS — M545 Low back pain: Secondary | ICD-10-CM | POA: Diagnosis not present

## 2015-03-12 DIAGNOSIS — M25519 Pain in unspecified shoulder: Secondary | ICD-10-CM | POA: Diagnosis not present

## 2015-03-12 DIAGNOSIS — M436 Torticollis: Secondary | ICD-10-CM | POA: Diagnosis not present

## 2015-03-12 DIAGNOSIS — R6889 Other general symptoms and signs: Secondary | ICD-10-CM | POA: Diagnosis not present

## 2015-03-12 DIAGNOSIS — M542 Cervicalgia: Secondary | ICD-10-CM | POA: Diagnosis not present

## 2015-03-12 NOTE — Therapy (Signed)
[place], [place], [address] Phone: [phone]   Fax:  [phone]  Physical Therapy Treatment  Patient Details  Name: Crystal Garza MRN: [id number] Date of Birth: 1970-09-17 No Data Recorded  Encounter Date: 03/12/2015      PT End of Session - 03/12/15 0843    Visit Number 6   Number of Visits 12   Date for PT Re-Evaluation 03/27/15   PT Start Time 0812  Pt was late and then had to check if a new order for treatment of her knee was here.    PT Stop Time 0855   PT Time Calculation (min) 43 min   Activity Tolerance Patient tolerated treatment well   Behavior During Therapy [facility] for tasks assessed/performed      Past Medical History  Diagnosis Date  . Anemia     iron def  . Hypertension   . Morbid obesity ([place])   . GERD (gastroesophageal reflux disease)   . Anxiety   . Depression   . Migraine   . Back pain   . Chronic knee pain   . Arthritis     left knee   . Hx of laparoscopic gastric banding   . Anginal pain ([place])   . Kidney stones   . DVT (deep venous thrombosis) ([place])   . PE (pulmonary embolism)     Past Surgical History  Procedure Laterality Date  . Abdominal hysterectomy    . Knee arthroscopy    . Cesarean section  05/1991  . Breath tek h pylori  07/28/2011    Procedure: BREATH TEK H PYLORI;  Surgeon: [name], MD;  Location: [facility];  Service: General;  Laterality: N/A;  to be done at 745  . Esophageal biopsy  02/16/2012    Procedure: BIOPSY;  Surgeon: [name], MD;  [facility];  Service: General;;  biopsy of mass x 2  . Eus  03/03/2012    Procedure: UPPER ENDOSCOPIC ULTRASOUND (EUS) LINEAR;  Surgeon: Milus Banister, MD;  [facility];  Service: Endoscopy;  Laterality: N/A;  . Tubal ligation    . Laparoscopic gastrectomy  04/08/2012    Procedure: LAPAROSCOPIC GASTRECTOMY;  Surgeon: [name], MD;  [facility];  Service:  General;;  removal of GIST tumor of stomach  . Laparoscopic gastric sleeve resection N/A 08/29/2012    Procedure: LAPAROSCOPIC GASTRIC SLEEVE RESECTION;  Surgeon: [name], MD;  [facility];  Service: General;  Laterality: N/A;  Sleeve Gastrectomy  . Esophagogastroduodenoscopy N/A 08/29/2012    Procedure: ESOPHAGOGASTRODUODENOSCOPY (EGD);  Surgeon: [name], MD;  [facility];  Service: General;  Laterality: N/A;  . Total knee arthroplasty Left 10/06/2013    Procedure: LEFT TOTAL KNEE ARTHROPLASTY;  Surgeon: Mcarthur Rossetti, MD;  [facility];  Service: Orthopedics;  Laterality: Left;  . Esophagogastroduodenoscopy N/A 09/04/2014    Procedure: ESOPHAGOGASTRODUODENOSCOPY (EGD);  Surgeon: Alphonsa Overall, MD;  Location: [facility];  Service: General;  Laterality: N/A;    There were no vitals filed for this visit.  Visit Diagnosis:  Stiffness of neck  Neck and shoulder pain  Abnormal posture      Subjective Assessment - 03/12/15 0810    Subjective Always have pain and stiffness.  Stretch any time I can   Currently in Pain? Yes   Pain Score 3    Pain Location Neck   Pain Orientation Right   Pain Descriptors / Indicators  Aching  stiff   Pain Type Chronic pain   Pain Onset More than a month ago   Pain Frequency Constant   Pain Relieving Factors needling has helped   Multiple Pain Sites No                         [facility] Adult PT Treatment/Exercise - 03/12/15 0814    Neck Exercises: Machines for Strengthening   UBE (Upper Arm Bike) UBE Level  1  79min forward, 3 min back   Neck Exercises: Theraband   Rows --   Moist Heat Therapy   Number Minutes Moist Heat 15 Minutes   Moist Heat Location Cervical   Ultrasound   Ultrasound Location neck RT    Ultrasound Parameters 100% , ! MHz 1.6 Wcm2   Ultrasound Goals Pain   Manual Therapy   Joint Mobilization MWM RT to LT pressures with RT rotationC2 to T 2. She was most tender in upper thoracic  levels.    Soft tissue mobilization STW with black [place] tool To RT anterior lateral and post neck   Passive ROM Cervical rotation nad side bend LT WNL and RT decreased.                   PT Short Term Goals - 03/07/15 1358    PT SHORT TERM GOAL #1   Title She is independent with inital HEP/.   Time 3   Period Weeks   Status Achieved   PT SHORT TERM GOAL #2   Title Cervical side bend improved to 35 degrees bilat with decreased pain   Time 3   Period Weeks   Status Achieved   PT SHORT TERM GOAL #3   Title Pain decreased in RT neck /shoulder 30% or more   Time 3   Period Weeks   Status Achieved           PT Long Term Goals - 03/05/15 2355    PT LONG TERM GOAL #1   Title She will be independent with all hEP issued as of last visit   Time 6   Period Weeks   Status On-going   PT LONG TERM GOAL #2   Title She wil report pain as intermittant   Time 6   Period Weeks   Status On-going   PT LONG TERM GOAL #3   Title PAin generally reduced 75% or more with work activity   Time 6   Period Weeks   Status On-going   PT LONG TERM GOAL #4   Title Cervical side bend 40 degrees or more with mild pain.    Time 6   Period Weeks   Status On-going   PT LONG TERM GOAL #5   Title She will be able to demo understanding of good posture   Time 6   Period Weeks   Status On-going               Plan - 03/12/15 0845    Clinical Impression Statement Pt limited with movements to RT of neck and tender along paraspinals and along upper scapula. She is minmally improved and will have at least one more dry needle treatment. She expected an order for knee rehab but we did not have this.    PT Next Visit Plan Continue to work on LT neck pain and assess knee if order here and time allows next visit.    Consulted and Agree with Plan of  Care Patient        Problem List Patient Active Problem List   Diagnosis Date Noted  . 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 Chronic neck and back  pain 02/27/2015  . Other chest pain 12/10/2014  . Thrombocytosis ([place]) 08/29/2014  . Medication management 08/22/2014  . Hematuria 08/22/2014  . Pain in joint, ankle and foot 05/23/2014  . Chronic anticoagulation 05/17/2014  . Chronic thoracic back pain 04/11/2014  . Right flank pain 01/17/2014  . DVT, bilateral lower limbs ([place]) 10/08/2013  . Degenerative arthritis of left knee 10/06/2013  . Status post total knee replacement 10/06/2013  . Edema 07/28/2013  . Chronic pain 06/13/2013  . 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 Bipolar disorder ([place]) 01/19/2012  . Nephrolithiasis 02/20/2011  . Eczematous dermatitis 10/22/2010  . Iron deficiency anemia 06/27/2010  . Anxiety 06/27/2010  . Migraine 09/12/2009  . Morbid obesity-BMI 62 10/30/2008  . Osteoarthrosis, unspecified whether generalized or localized, involving lower leg 07/05/2007  . DEPRESSION, MAJOR, RECURRENT 07/01/2006  . HYPERTENSION, BENIGN SYSTEMIC 07/01/2006  . RHINITIS, ALLERGIC 07/01/2006  . GASTROESOPHAGEAL REFLUX, NO ESOPHAGITIS 07/01/2006    Darrel Hoover PT 03/12/2015, 8:53 AM  [facility] [address] [place], [place], [address] Phone: [phone]   Fax:  [phone]  Name: Crystal Garza MRN: [id number] Date of Birth: 11-Mar-1971

## 2015-03-13 ENCOUNTER — Ambulatory Visit: Payer: Self-pay | Admitting: Family Medicine

## 2015-03-13 ENCOUNTER — Other Ambulatory Visit: Payer: Self-pay | Admitting: Family Medicine

## 2015-03-14 ENCOUNTER — Ambulatory Visit: Payer: Medicare Other | Admitting: Physical Therapy

## 2015-03-14 DIAGNOSIS — M25519 Pain in unspecified shoulder: Secondary | ICD-10-CM | POA: Diagnosis not present

## 2015-03-14 DIAGNOSIS — M542 Cervicalgia: Secondary | ICD-10-CM

## 2015-03-14 DIAGNOSIS — M545 Low back pain, unspecified: Secondary | ICD-10-CM

## 2015-03-14 DIAGNOSIS — R6889 Other general symptoms and signs: Secondary | ICD-10-CM

## 2015-03-14 DIAGNOSIS — R293 Abnormal posture: Secondary | ICD-10-CM

## 2015-03-14 DIAGNOSIS — M436 Torticollis: Secondary | ICD-10-CM

## 2015-03-14 NOTE — Patient Instructions (Signed)
Pt given Neck: headaches/self- SNAGS sheet   Sit with good posture and take towel and wrap around neck and take Right hand and pull towel past cheek.  Perform as shown in clinic.  Voncille Lo, PT 03/14/2015 8:41 AM Phone: [phone] Fax: [phone]

## 2015-03-14 NOTE — Therapy (Signed)
[place], [place], [address] Phone: [phone]   Fax:  [phone]  Physical Therapy Treatment  Patient Details  Name: Crystal Garza MRN: [id number] Date of Birth: [birth year] No Data Recorded  Encounter Date: 03/14/2015      PT End of Session - 03/14/15 0900    Visit Number 7   Number of Visits 12   Date for PT Re-Evaluation 03/27/15   PT Start Time 0805   PT Stop Time 0900   PT Time Calculation (min) 55 min   Activity Tolerance Patient tolerated treatment well   Behavior During Therapy [facility] for tasks assessed/performed      Past Medical History  Diagnosis Date  . Anemia     iron def  . Hypertension   . Morbid obesity ([place])   . GERD (gastroesophageal reflux disease)   . Anxiety   . Depression   . Migraine   . Back pain   . Chronic knee pain   . Arthritis     left knee   . Hx of laparoscopic gastric banding   . Anginal pain ([place])   . Kidney stones   . DVT (deep venous thrombosis) ([place])   . PE (pulmonary embolism)     Past Surgical History  Procedure Laterality Date  . Abdominal hysterectomy    . Knee arthroscopy    . Cesarean section  05/1991  . Breath tek h pylori  07/28/2011    Procedure: BREATH TEK H PYLORI;  Surgeon: [name], MD;  Location: [facility];  Service: General;  Laterality: N/A;  to be done at 745  . Esophageal biopsy  02/16/2012    Procedure: BIOPSY;  Surgeon: [name], MD;  Location: [facility];  Service: General;;  biopsy of mass x 2  . Eus  03/03/2012    Procedure: UPPER ENDOSCOPIC ULTRASOUND (EUS) LINEAR;  Surgeon: Milus Banister, MD;  [facility];  Service: Endoscopy;  Laterality: N/A;  . Tubal ligation    . Laparoscopic gastrectomy  04/08/2012    Procedure: LAPAROSCOPIC GASTRECTOMY;  Surgeon: [name], MD;  [facility];  Service: General;;  removal of GIST tumor of stomach  . Laparoscopic gastric sleeve resection N/A  08/29/2012    Procedure: LAPAROSCOPIC GASTRIC SLEEVE RESECTION;  Surgeon: [name], MD;  [facility];  Service: General;  Laterality: N/A;  Sleeve Gastrectomy  . Esophagogastroduodenoscopy N/A 08/29/2012    Procedure: ESOPHAGOGASTRODUODENOSCOPY (EGD);  Surgeon: [name], MD;  [facility];  Service: General;  Laterality: N/A;  . Total knee arthroplasty Left 10/06/2013    Procedure: LEFT TOTAL KNEE ARTHROPLASTY;  Surgeon: Mcarthur Rossetti, MD;  [facility];  Service: Orthopedics;  Laterality: Left;  . Esophagogastroduodenoscopy N/A 09/04/2014    Procedure: ESOPHAGOGASTRODUODENOSCOPY (EGD);  Surgeon: Alphonsa Overall, MD;  Location: [facility];  Service: General;  Laterality: N/A;    There were no vitals filed for this visit.  Visit Diagnosis:  Stiffness of neck  Neck and shoulder pain  Abnormal posture  Activity intolerance  Bilateral low back pain without sciatica      Subjective Assessment - 03/14/15 0812    Currently in Pain? 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    Neck Exercises: Supine   Other Supine Exercise instruction in self myofascial release of sub occipital muscle using tennis balls bil neck. to be used for 3 min in supine at home   Moist Heat Therapy   Number Minutes Moist Heat 15 Minutes   Moist Heat Location  Cervical   Manual Therapy   Joint Mobilization PA mobs grade 3 and lateral glides with cavitation at C-3 with overpressure with rot to Left,  thoracic PA mobs T-3 to T-8    Soft tissue mobilization STW to bil cervical paraspinals and Right scalenes and SCM   Myofascial Release suboccipital release 3 x 20 sec hold   Passive ROM Cervical rotation and  side bend max rotation without pain    Neck Exercises: Stretches   Upper Trapezius Stretch 2 reps;30 seconds   Levator Stretch 2 reps;30 seconds          Trigger Point Dry Needling - 03/14/15 0814    Consent Given? Yes   Education Handout Provided No  previously given   Muscles Treated Upper Body Upper trapezius;Subscapularis;Rhomboids;Levator scapulae  C-3 to c5 right only twitch illicited/Scalene R twitch respo   Upper Trapezius Response Twitch reponse elicited;Palpable increased muscle length  bil   Levator Scapulae Response Twitch response elicited  right only   Rhomboids Response Palpable increased muscle length  right only   Subscapularis Response Twitch response elicited  right only              PT Education - 03/14/15 0841    Education provided Yes   Education Details Added to HEP with Neck SNAG with towel/ tennis balls in tube sock for self Myofascial stretch of sub occipitals   Person(s) Educated Patient   Methods Explanation;Demonstration;Tactile cues;Verbal cues;Handout   Comprehension Verbalized understanding;Returned demonstration          PT Short Term Goals - 03/07/15 1358    PT SHORT TERM GOAL #1   Title She is independent with inital HEP/.   Time 3   Period Weeks   Status Achieved   PT SHORT TERM GOAL #2   Title Cervical side bend improved to 35 degrees bilat with decreased pain   Time 3   Period Weeks   Status Achieved   PT SHORT TERM GOAL #3   Title Pain decreased in RT neck /shoulder 30% or more   Time 3   Period Weeks   Status Achieved           PT Long Term Goals - 03/14/15 0915     PT LONG TERM GOAL #1   Title She will be independent with all hEP issued as of last visit   Time 6   Period Weeks   Status On-going   PT LONG TERM GOAL #2   Title She  wil report pain as intermittant   Baseline Constant 5/10   Time 6   Period Weeks   Status On-going   PT LONG TERM GOAL #3   Title PAin generally reduced 75% or more with work activity   Time 6   Period Weeks   Status On-going   PT LONG TERM GOAL #4   Title Cervical side bend 40 degrees or more with mild pain.    Baseline Right side bend 50 and left 53 after treatment.   Time 6   Period Weeks   Status Achieved   PT LONG TERM GOAL #5   Title She will be able to demo understanding of good posture   Time 6   Period Weeks   Status On-going               Plan - 03/14/15 [id number]    Clinical Impression Statement Pt now had order for knee evaluation but wanted to concentrate on neck pain today.  Pt after treatment of TDN to cervical paraspinals, upper trap and levator and Right scalenes had increased AROM and decreased pain.  Pt AROM of cervical  neck flex 65, ext 45, Side bend right 50, left 53, Rotation bil 60 degrees.  WNL.  Pt will continue with achieving goals and eval  Left knee next visit   Pt will benefit from skilled therapeutic intervention in order to improve on the following deficits Postural dysfunction;Pain;Decreased activity tolerance;Decreased range of motion;Increased muscle spasms   Rehab Potential Good   PT Frequency 2x / week   PT Duration 6 weeks   PT Treatment/Interventions Moist Heat;Traction;Ultrasound;Therapeutic activities;Therapeutic exercise;Manual techniques;Taping;Dry needling;Patient/family education;Passive range of motion   PT Next Visit Plan Evaluate Left knee old TKR from 10-06-2013 next visit.  Assess dry needling   PT Home Exercise Plan HEP supine scap exercise, chin tuck with towel and towel SNAGs and tennis ball in tube sock for use with self myofascial releaase at home         Problem List Patient Active Problem List   Diagnosis Date Noted  . 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 Chronic neck and back pain 02/27/2015  . Other chest pain 12/10/2014  . Thrombocytosis ([place]) 08/29/2014  . Medication management 08/22/2014  . Hematuria 08/22/2014  . Pain in joint, ankle and foot 05/23/2014  . Chronic anticoagulation 05/17/2014  . Chronic thoracic back pain 04/11/2014  . Right flank pain 01/17/2014  . DVT, bilateral lower limbs ([place]) 10/08/2013  . Degenerative arthritis of left knee 10/06/2013  . Status post total knee replacement 10/06/2013  . Edema 07/28/2013  . Chronic pain 06/13/2013  . 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 Bipolar disorder ([place]) 01/19/2012  . Nephrolithiasis 02/20/2011  . Eczematous dermatitis 10/22/2010  . Iron deficiency anemia 06/27/2010  . Anxiety 06/27/2010  . Migraine 09/12/2009  . Morbid obesity-BMI 62 10/30/2008  . Osteoarthrosis, unspecified whether generalized or localized, involving lower leg 07/05/2007  . DEPRESSION, MAJOR, RECURRENT 07/01/2006  . HYPERTENSION, BENIGN SYSTEMIC 07/01/2006  . RHINITIS, ALLERGIC 07/01/2006  . GASTROESOPHAGEAL REFLUX, NO ESOPHAGITIS 07/01/2006    Voncille Lo, PT 03/14/2015 9:18 AM Phone: [phone] Fax: [facility] [address] [place], [place], [address] Phone: [phone]   Fax:  [phone]  Name: Crystal Garza MRN: [id number] Date of Birth: 11/10/1970

## 2015-03-19 ENCOUNTER — Ambulatory Visit: Payer: Medicare Other

## 2015-03-21 ENCOUNTER — Ambulatory Visit: Payer: Medicare Other | Admitting: Physical Therapy

## 2015-03-21 DIAGNOSIS — R293 Abnormal posture: Secondary | ICD-10-CM | POA: Diagnosis not present

## 2015-03-21 DIAGNOSIS — R6889 Other general symptoms and signs: Secondary | ICD-10-CM | POA: Diagnosis not present

## 2015-03-21 DIAGNOSIS — M25519 Pain in unspecified shoulder: Secondary | ICD-10-CM | POA: Diagnosis not present

## 2015-03-21 DIAGNOSIS — M436 Torticollis: Secondary | ICD-10-CM | POA: Diagnosis not present

## 2015-03-21 DIAGNOSIS — M542 Cervicalgia: Secondary | ICD-10-CM | POA: Diagnosis not present

## 2015-03-21 DIAGNOSIS — M545 Low back pain: Secondary | ICD-10-CM | POA: Diagnosis not present

## 2015-03-21 NOTE — Therapy (Signed)
[place], [place], [address] Phone: [phone]   Fax:  [phone]  Physical Therapy Treatment  Patient Details  Name: Crystal Garza MRN: [id number] Date of Birth: [birth year] No Data Recorded  Encounter Date: 03/21/2015      PT End of Session - 03/21/15 1415    Visit Number 8   Number of Visits 12   Date for PT Re-Evaluation 03/27/15   PT Start Time 1330   PT Stop Time 1425   PT Time Calculation (min) 55 min   Activity Tolerance Patient tolerated treatment well;No increased pain   Behavior During Therapy [facility] for tasks assessed/performed      Past Medical History  Diagnosis Date  . Anemia     iron def  . Hypertension   . Morbid obesity ([place])   . GERD (gastroesophageal reflux disease)   . Anxiety   . Depression   . Migraine   . Back pain   . Chronic knee pain   . Arthritis     left knee   . Hx of laparoscopic gastric banding   . Anginal pain ([place])   . Kidney stones   . DVT (deep venous thrombosis) ([place])   . PE (pulmonary embolism)     Past Surgical History  Procedure Laterality Date  . Abdominal hysterectomy    . Knee arthroscopy    . Cesarean section  05/1991  . Breath tek h pylori  07/28/2011    Procedure: BREATH TEK H PYLORI;  Surgeon: [name], MD;  Location: [facility];  Service: General;  Laterality: N/A;  to be done at 745  . Esophageal biopsy  02/16/2012    Procedure: BIOPSY;  Surgeon: [name], MD;  [facility];  Service: General;;  biopsy of mass x 2  . Eus  03/03/2012    Procedure: UPPER ENDOSCOPIC ULTRASOUND (EUS) LINEAR;  Surgeon: Milus Banister, MD;  [facility];  Service: Endoscopy;  Laterality: N/A;  . Tubal ligation    . Laparoscopic gastrectomy  04/08/2012    Procedure: LAPAROSCOPIC GASTRECTOMY;  Surgeon: [name], MD;  [facility];  Service: General;;  removal of GIST tumor of stomach  . Laparoscopic gastric sleeve  resection N/A 08/29/2012    Procedure: LAPAROSCOPIC GASTRIC SLEEVE RESECTION;  Surgeon: [name], MD;  [facility];  Service: General;  Laterality: N/A;  Sleeve Gastrectomy  . Esophagogastroduodenoscopy N/A 08/29/2012    Procedure: ESOPHAGOGASTRODUODENOSCOPY (EGD);  Surgeon: [name], MD;  [facility];  Service: General;  Laterality: N/A;  . Total knee arthroplasty Left 10/06/2013    Procedure: LEFT TOTAL KNEE ARTHROPLASTY;  Surgeon: Mcarthur Rossetti, MD;  Location[facility];  Service: Orthopedics;  Laterality: Left;  . Esophagogastroduodenoscopy N/A 09/04/2014    Procedure: ESOPHAGOGASTRODUODENOSCOPY (EGD);  Surgeon: Alphonsa Overall, MD;  Location: [facility];  Service: General;  Laterality: N/A;    There were no vitals filed for this visit.  Visit Diagnosis:  Stiffness of neck  Neck and shoulder pain  Abnormal posture      Subjective Assessment - 03/21/15 1340    Subjective Knee is aching , but not as much as usuall.  Dry needle helped.  3/10 .  Spasms intermittantly    Currently in Pain? Yes   Pain Location Neck   Pain Orientation Right   Pain Descriptors / Indicators Aching;Spasm   Pain Radiating Towards Rt shoulder   Pain Frequency Constant  Aggravating Factors  working tasks irritate   Pain Relieving Factors needling, heat                         [facility] Adult PT Treatment/Exercise - 03/21/15 1340    Self-Care   Self-Care --  posturre, how to sneeze with abdominal bracing   Moist Heat Therapy   Number Minutes Moist Heat 15 Minutes   Moist Heat Location Cervical   Ultrasound   Ultrasound Location Neck RT, uppertrap   Ultrasound Parameters 100% 1.5 watts/cm2, 8 minutes   Ultrasound Goals Pain   Manual Therapy   Soft tissue mobilization Soft tissue work to upper trap. neck, sternomastiod, scaleens RT with [employer]. amd manuall stretch to same with strumming to lengthen                  PT Short Term Goals - 03/07/15  1358    PT SHORT TERM GOAL #1   Title She is independent with inital HEP/.   Time 3   Period Weeks   Status Achieved   PT SHORT TERM GOAL #2   Title Cervical side bend improved to 35 degrees bilat with decreased pain   Time 3   Period Weeks   Status Achieved   PT SHORT TERM GOAL #3   Title Pain decreased in RT neck /shoulder 30% or more   Time 3   Period Weeks   Status Achieved           PT Long Term Goals - 03/21/15 1416    PT LONG TERM GOAL #1   Title She will be independent with all hEP issued as of last visit   Period Weeks   Status Unable to assess   PT LONG TERM GOAL #2   Title She wil report pain as intermittant   Baseline constant   Time 6   Period Weeks   Status On-going   PT LONG TERM GOAL #3   Title PAin generally reduced 75% or more with work activity   Time 6   Period Weeks   Status On-going   PT LONG TERM GOAL #4   Title Cervical side bend 40 degrees or more with mild pain.    Time 6   Period Weeks   Status Achieved   PT LONG TERM GOAL #5   Title She will be able to demo understanding of good posture   Time 6   Period Weeks   Status On-going               Plan - 03/21/15 1415    Clinical Impression Statement Needs Knee eval.  Needs to decide how to schedule both or 1 at a time, 2 different MD's   PT Next Visit Plan Evaluate Left knee old TKR from 10-06-2013 next visit.  Assess dry needling   Consulted and Agree with Plan of Care Patient        Problem List Patient Active Problem List   Diagnosis Date Noted  . 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 Chronic neck and back pain 02/27/2015  . Other chest pain 12/10/2014  . Thrombocytosis ([place]) 08/29/2014  . Medication management 08/22/2014  . Hematuria 08/22/2014  . Pain in joint, ankle and foot 05/23/2014  . Chronic anticoagulation 05/17/2014  . Chronic thoracic back pain 04/11/2014  . Right flank pain 01/17/2014  . DVT, bilateral lower limbs ([place]) 10/08/2013  . Degenerative arthritis of  left knee 10/06/2013  . Status post  total knee replacement 10/06/2013  . Edema 07/28/2013  . Chronic pain 06/13/2013  . 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 Bipolar disorder ([place]) 01/19/2012  . Nephrolithiasis 02/20/2011  . Eczematous dermatitis 10/22/2010  . Iron deficiency anemia 06/27/2010  . Anxiety 06/27/2010  . Migraine 09/12/2009  . Morbid obesity-BMI 62 10/30/2008  . Osteoarthrosis, unspecified whether generalized or localized, involving lower leg 07/05/2007  . DEPRESSION, MAJOR, RECURRENT 07/01/2006  . HYPERTENSION, BENIGN SYSTEMIC 07/01/2006  . RHINITIS, ALLERGIC 07/01/2006  . GASTROESOPHAGEAL REFLUX, NO ESOPHAGITIS 07/01/2006    Crescent Gotham 03/21/2015, 2:28 PM  [facility] [address] [place], [place], [address] Phone: [phone]   Fax:  [phone]  Name: Crystal Garza MRN: [id number] Date of Birth: 04/21/1971    Melvenia Needles, PTA 03/21/2015 2:28 PM Phone: [phone] Fax: [phone]

## 2015-04-01 ENCOUNTER — Encounter: Payer: Medicare Other | Attending: Surgery | Admitting: Dietician

## 2015-04-01 ENCOUNTER — Encounter: Payer: Self-pay | Admitting: Dietician

## 2015-04-01 DIAGNOSIS — Z6841 Body Mass Index (BMI) 40.0 and over, adult: Secondary | ICD-10-CM | POA: Insufficient documentation

## 2015-04-01 DIAGNOSIS — Z713 Dietary counseling and surveillance: Secondary | ICD-10-CM | POA: Diagnosis not present

## 2015-04-01 NOTE — Progress Notes (Signed)
  Medical Nutrition Therapy:  Appt start time: 810 end time: 830  Follow up:  Primary concerns today: Crystal Garza returns having gained a few pounds. Feels like she ate too much over Thanksgiving. Has continued to drink sugar free liquids and is trying to eat more salads with fat free balsamic. Also riding bike every day. Taking in a lot of excess carbs like veggies straws and crackers and "cinnamon straws." Still hasn't gotten any vitamins. Has vomiting 2x a week. Plans to follow up with Jonni Sanger (PA at [facility]) in December to address "burming" in stomach. Taking Protonix 2x a day.  Goal: under 300 lbs  Preferred Learning Style:   No preference indicated   Learning Readiness:   Ready   MEDICATIONS: see list   DIETARY INTAKE:  Avoided foods include hamburger/beef, hot dogs, bread.    24-hr recall:  B ( AM): boiled egg and coffee Snk (10 AM):  L ( PM): pasta with shrimp and white sauce (yesterday); usually P3 pack  Snk ( PM): potato chips D ( PM): meatloaf with green beans and rice Snk ( PM): ritz crackers and cheese  Beverages: water with sugar free flavoring  Usual physical activity: walks at least a mile per day when she gets home from work  Estimated energy needs: 1300-1500 calories  Progress Towards Goal(s):  In progress.   Nutritional Diagnosis:  [place]-3.4 Unintentional weight gain As related to increase in convenience foods and fast foods.  As evidenced by patient report of frequently snacking on chips, cookies, and fried foods.    Intervention:  Nutrition counseling provided. Goals: -Start having a protein shake for breakfast Nurse, mental health) -Work on eating at least 3 meals a day -Stick with lean meats and avoid fried foods (refer to fast food guide) -Focus on lean meat and non starchy vegetables -Try packing an insulated lunchbox with an ice pack to keep in your car (protein shakes, Dannon [employer] and Fit Greek yogurt, cheese and lunchmeat, protein chips, egg salad) -Get your  slowcooker back from your daughter so you can prep meals ahead of time -Keep up walking routine  Flintstones "complete" (chewable, not gummy) 1500 mg Calcium citrate (500 mg 3x a day) - try Celebrate calcium chews (get these at the [facility] outpatient pharmacy) Vitamin B12 (sublingual)  Teaching Method Utilized:  Visual Auditory  Barriers to learning/adherence to lifestyle change: food preferences  Demonstrated degree of understanding via:  Teach Back   Monitoring/Evaluation:  Dietary intake, exercise, and body weight in 3 month(s).

## 2015-04-01 NOTE — Patient Instructions (Addendum)
-  Work on eating at least 3 meals a day -Stick with lean meats and avoid fried foods (refer to fast food guide) -Focus on lean meat and non starchy vegetables -Try packing an insulated lunchbox with an ice pack to keep in your car (protein shakes, [employer] and Fit Greek yogurt, cheese and lunchmeat, protein chips, egg salad, boiled eggs) -Avoid Ritz crackers, veggie straws, and cinnamon straws -Try having reduced fat or 2% cheese  -Keep up walking routine   Flintstones "complete" (chewable, not gummy) 1500 mg Calcium citrate (500 mg 3x a day) - try Celebrate calcium chews (get these at the [place]) Vitamin B12 (sublingual)

## 2015-04-03 ENCOUNTER — Telehealth: Payer: Self-pay | Admitting: Family Medicine

## 2015-04-03 NOTE — Telephone Encounter (Signed)
The dr she was going to have her breast reduction done is out of network for her insurance. She needs a referral that will take her insurance-[employer] and Medicaid

## 2015-04-03 NOTE — Telephone Encounter (Signed)
I am routing to our referral coordinator [name]

## 2015-04-04 ENCOUNTER — Ambulatory Visit: Payer: Medicare Other

## 2015-04-09 NOTE — Telephone Encounter (Signed)
I have called all offices in [place] and I have not been able to find one that will take patient with her insurance. [facility] Medicare & MCD) I will continue to look but if seems we may have to go outside of [place] at this time.

## 2015-04-10 ENCOUNTER — Ambulatory Visit: Payer: Medicare Other | Attending: Orthopaedic Surgery | Admitting: Physical Therapy

## 2015-04-10 DIAGNOSIS — R600 Localized edema: Secondary | ICD-10-CM | POA: Diagnosis present

## 2015-04-10 DIAGNOSIS — R29898 Other symptoms and signs involving the musculoskeletal system: Secondary | ICD-10-CM

## 2015-04-10 DIAGNOSIS — R262 Difficulty in walking, not elsewhere classified: Secondary | ICD-10-CM | POA: Diagnosis present

## 2015-04-10 DIAGNOSIS — M25662 Stiffness of left knee, not elsewhere classified: Secondary | ICD-10-CM | POA: Diagnosis present

## 2015-04-10 NOTE — Therapy (Signed)
[place], [place], [address] Phone: [phone]   Fax:  [phone]  Physical Therapy Evaluation  Patient Details  Name: Crystal Garza MRN: [id number] Date of Birth: 10-09-1970 Referring Provider: Ninfa Linden  Encounter Date: 04/10/2015      PT End of Session - 04/10/15 1248    Visit Number 1   Number of Visits 12   Date for PT Re-Evaluation 05/22/15   PT Start Time 0800   PT Stop Time 0845   PT Time Calculation (min) 45 min   Activity Tolerance Patient tolerated treatment well   Behavior During Therapy [facility] for tasks assessed/performed      Past Medical History  Diagnosis Date  . Anemia     iron def  . Hypertension   . Morbid obesity ([place])   . GERD (gastroesophageal reflux disease)   . Anxiety   . Depression   . Migraine   . Back pain   . Chronic knee pain   . Arthritis     left knee   . Hx of laparoscopic gastric banding   . Anginal pain ([place])   . Kidney stones   . DVT (deep venous thrombosis) ([place])   . PE (pulmonary embolism)     Past Surgical History  Procedure Laterality Date  . Abdominal hysterectomy    . Knee arthroscopy    . Cesarean section  05/1991  . Breath tek h pylori  07/28/2011    Procedure: BREATH TEK H PYLORI;  Surgeon: [name], MD;  Location: [facility];  Service: General;  Laterality: N/A;  to be done at 745  . Esophageal biopsy  02/16/2012    Procedure: BIOPSY;  Surgeon: [name], MD;  Location: [facility];  Service: General;;  biopsy of mass x 2  . Eus  03/03/2012    Procedure: UPPER ENDOSCOPIC ULTRASOUND (EUS) LINEAR;  Surgeon: Milus Banister, MD;  Location: [facility];  Service: Endoscopy;  Laterality: N/A;  . Tubal ligation    . Laparoscopic gastrectomy  04/08/2012    Procedure: LAPAROSCOPIC GASTRECTOMY;  Surgeon: [name], MD;  Location: [facility];  Service: General;;  removal of GIST tumor of stomach  . Laparoscopic gastric sleeve  resection N/A 08/29/2012    Procedure: LAPAROSCOPIC GASTRIC SLEEVE RESECTION;  Surgeon: [name], MD;  [facility];  Service: General;  Laterality: N/A;  Sleeve Gastrectomy  . Esophagogastroduodenoscopy N/A 08/29/2012    Procedure: ESOPHAGOGASTRODUODENOSCOPY (EGD);  Surgeon: [name], MD;  [facility];  Service: General;  Laterality: N/A;  . Total knee arthroplasty Left 10/06/2013    Procedure: LEFT TOTAL KNEE ARTHROPLASTY;  Surgeon: Mcarthur Rossetti, MD;  Location: [facility];  Service: Orthopedics;  Laterality: Left;  . Esophagogastroduodenoscopy N/A 09/04/2014    Procedure: ESOPHAGOGASTRODUODENOSCOPY (EGD);  Surgeon: Alphonsa Overall, MD;  Location: [facility];  Service: General;  Laterality: N/A;    There were no vitals filed for this visit.  Visit Diagnosis:  Weakness of left lower extremity  Difficulty in walking  Stiffness of knee joint, left  Localized edema      Subjective Assessment - 04/10/15 0809    Subjective Pt had TKR on 10/06/13. She cont to have stiffness, achiness, swelling.  This interferes with her ability to negotiating stairs, walking >35-40 min and riding the statiionary bike.  Diff standing long periods, knee has buckled a couple of times.     How long can you sit comfortably? 60 min  How long can you stand comfortably? 60 min    How long can you walk comfortably? 40 min    Patient Stated Goals Starting to exercise more, riding a bike   Currently in Pain? Yes   Pain Score 5    Pain Location Knee   Pain Orientation Left;Anterior;Medial   Pain Descriptors / Indicators Aching   Pain Type Chronic pain   Pain Onset More than a month ago   Pain Frequency Intermittent   Aggravating Factors  walking, standing   Pain Relieving Factors rest, meds, ice/heat             [facility] PT Assessment - 04/10/15 0814    Assessment   Medical Diagnosis L TKR   Referring Provider Ninfa Linden   Onset Date/Surgical Date --  10/06/13   Prior Therapy Yes,  HHPT   Precautions   Precautions None   Restrictions   Weight Bearing Restrictions No   Balance Screen   Has the patient fallen in the past 6 months No   Has the patient had a decrease in activity level because of a fear of falling?  No   Is the patient reluctant to leave their home because of a fear of falling?  No   Home Social worker Private residence   Living Arrangements Spouse/significant other;Children   Available Help at Discharge Family   Type of [place] Access Level entry   Home Layout Two level   Prior Function   Level of Independence Independent   Vocation Part time employment   Actor, standingf, lifting    Leisure family activities   Cognition   Overall Cognitive Status Within Functional Limits for tasks assessed   Observation/Other Assessments   Observations 55%   Observation/Other Assessments-Edema    Edema Circumferential   Circumferential Edema   Circumferential - Right 18.5 inch   Circumferential - Left  19 inch   Sensation   Light Touch Appears Intact   Coordination   Gross Motor Movements are Fluid and Coordinated Not tested   Posture/Postural Control   Posture/Postural Control Postural limitations   Posture Comments genu recurvatum Rt. and genu valgus   AROM   Right Knee Extension 0   Right Knee Flexion 120   Left Knee Extension 2   Left Knee Flexion 92   Strength   Right Hip Flexion 4/5   Right Hip ABduction 3/5   Left Hip Flexion 3+/5   Left Hip ABduction 4/5   Right Knee Flexion 5/5   Right Knee Extension 5/5   Left Knee Flexion 4/5   Left Knee Extension 4/5   Flexibility   Soft Tissue Assessment /Muscle Length yes   Hamstrings tight on L ., WNL RT   Palpation   Patella mobility good    Palpation comment sore medial joint line                    [facility] Adult PT Treatment/Exercise - 04/10/15 0845    Knee/Hip Exercises: Aerobic   Nustep L 5, 8 min for ROM, strength     Knee/Hip Exercises: Supine   Quad Sets Strengthening;Both;1 set;10 reps   Straight Leg Raises Strengthening;Both;1 set;10 reps   Straight Leg Raises Limitations HEP   Knee/Hip Exercises: Sidelying   Hip ABduction Strengthening;Both;1 set;10 reps   Hip ABduction Limitations HEP   Moist Heat Therapy   Number Minutes Moist Heat 10 Minutes   Moist Heat Location Knee  PT Education - 04/13/2015 1248    Education provided Yes   Education Details KNEE PT/POC, HEP and quad strength   Person(s) Educated Patient   Methods Explanation   Comprehension Verbalized understanding;Returned demonstration;Need further instruction          PT Short Term Goals - 13-Apr-2015 1549    PT SHORT TERM GOAL #1   Title She is independent with inital HEP for knee   Time 2   Period Weeks   Status New   PT SHORT TERM GOAL #2   Title Pt will be able to bend knee to 100 deg with greater ease in stting    Time 2   Period Weeks   Status New   PT SHORT TERM GOAL #3   Title Pt will report stiffness , pain in knee 20-25% better   Time 3   Period Weeks   Status New           PT Long Term Goals - 2015-04-13 1552    PT LONG TERM GOAL #1   Title She will be independent with all HEP issued as of last visit   Time 6   Period Weeks   Status New   PT LONG TERM GOAL #2   Title Pt will be able to walk for 30 min and report no more than min pain 3/10.    Time 6   Period Weeks   Status New   PT LONG TERM GOAL #3   Title Pt will be able to go up and down stairs reciprocally with min knee pain.    Time 6   Period Weeks   Status New   PT LONG TERM GOAL #4   Title Pt will demo strength in L knee 4+/5 for improved transfers, sit to stand.    Time 6   Period Weeks   Status New               Plan - 04/13/15 1249    Clinical Impression Statement Patient reports with knee stiffness and quad weakness which interferes with her daily mobility tasks, goal of riding a bike again.     Pt  will benefit from skilled therapeutic intervention in order to improve on the following deficits Postural dysfunction;Pain;Decreased activity tolerance;Decreased range of motion;Increased muscle spasms;Decreased strength;Decreased mobility;Increased edema;Hypomobility;Decreased scar mobility;Impaired flexibility;Difficulty walking;Increased fascial restricitons;Obesity   Rehab Potential Good   PT Frequency 2x / week   PT Duration 6 weeks   PT Treatment/Interventions Ultrasound;Neuromuscular re-education;Scar mobilization;Patient/family education;Passive range of motion;Stair training;Cryotherapy;Electrical Stimulation;Functional mobility training;Therapeutic activities;Moist Heat;Balance training;Therapeutic exercise;Manual techniques;Taping   PT Next Visit Plan Review HEP, NuStep, progress strength as tolerated   PT Home Exercise Plan LAQ, QUAD SETS, hip abd and SLR   Consulted and Agree with Plan of Care Patient          G-Codes - 04-13-15 1548    Functional Assessment Tool Used FOTO   Functional Limitation Mobility: Walking and moving around   Mobility: Walking and Moving Around Current Status [phone]) At least 40 percent but less than 60 percent impaired, limited or restricted   Mobility: Walking and Moving Around Goal Status [phone]) At least 20 percent but less than 40 percent impaired, limited or restricted       Problem List Patient Active Problem List   Diagnosis Date Noted  . 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 Chronic neck and back pain 02/27/2015  . Other chest pain 12/10/2014  . Thrombocytosis ([place]) 08/29/2014  . Medication management 08/22/2014  .  Hematuria 08/22/2014  . Pain in joint, ankle and foot 05/23/2014  . Chronic anticoagulation 05/17/2014  . Chronic thoracic back pain 04/11/2014  . Right flank pain 01/17/2014  . DVT, bilateral lower limbs ([place]) 10/08/2013  . Degenerative arthritis of left knee 10/06/2013  . Status post total knee replacement 10/06/2013  . Edema  07/28/2013  . Chronic pain 06/13/2013  . 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 Bipolar disorder ([place]) 01/19/2012  . Nephrolithiasis 02/20/2011  . Eczematous dermatitis 10/22/2010  . Iron deficiency anemia 06/27/2010  . Anxiety 06/27/2010  . Migraine 09/12/2009  . Morbid obesity-BMI 62 10/30/2008  . Osteoarthrosis, unspecified whether generalized or localized, involving lower leg 07/05/2007  . DEPRESSION, MAJOR, RECURRENT 07/01/2006  . HYPERTENSION, BENIGN SYSTEMIC 07/01/2006  . RHINITIS, ALLERGIC 07/01/2006  . GASTROESOPHAGEAL REFLUX, NO ESOPHAGITIS 07/01/2006    PAA,JENNIFER 04/10/2015, 3:58 PM  [facility] [address] [place], [place], [address] Phone: [phone]   Fax:  [phone]  Name: Crystal Garza MRN: [id number] Date of Birth: [birth year]   Raeford Razor, PT 04/10/2015 3:59 PM Phone: [phone] Fax: [phone]

## 2015-04-15 ENCOUNTER — Ambulatory Visit (HOSPITAL_COMMUNITY): Payer: Self-pay | Admitting: Psychiatry

## 2015-04-16 ENCOUNTER — Ambulatory Visit: Payer: Medicare Other | Admitting: Physical Therapy

## 2015-04-16 DIAGNOSIS — M25662 Stiffness of left knee, not elsewhere classified: Secondary | ICD-10-CM

## 2015-04-16 DIAGNOSIS — R600 Localized edema: Secondary | ICD-10-CM | POA: Diagnosis not present

## 2015-04-16 DIAGNOSIS — R29898 Other symptoms and signs involving the musculoskeletal system: Secondary | ICD-10-CM

## 2015-04-16 DIAGNOSIS — R262 Difficulty in walking, not elsewhere classified: Secondary | ICD-10-CM

## 2015-04-16 NOTE — Patient Instructions (Addendum)
Abduction: Clam (Eccentric) - Side-Lying   Lie on side with knees bent. Lift top knee, keeping feet together. Keep trunk steady. Slowly lower for 3-5 seconds. __15_ reps per set, _1_ sets per day, __7_ days per week. Add ___ lbs when you achieve ___ repetitions.  Copyright  VHI. All rights reserved.   

## 2015-04-16 NOTE — Therapy (Addendum)
[place], [place], [address] Phone: [phone]   Fax:  [phone]  Physical Therapy Treatment/Discharge Summary  Patient Details  Name: Crystal Garza MRN: [id number] Date of Birth: 1970-12-05 Referring Provider: Ninfa Linden  Encounter Date: 04/16/2015      PT End of Session - 04/16/15 0812    Visit Number 2   Number of Visits 12   Date for PT Re-Evaluation 05/22/15   Authorization Type [employer] Medicare   PT Start Time 0806   PT Stop Time 0845   PT Time Calculation (min) 39 min   Activity Tolerance Patient tolerated treatment well      Past Medical History  Diagnosis Date  . Anemia     iron def  . Hypertension   . Morbid obesity ([place])   . GERD (gastroesophageal reflux disease)   . Anxiety   . Depression   . Migraine   . Back pain   . Chronic knee pain   . Arthritis     left knee   . Hx of laparoscopic gastric banding   . Anginal pain ([place])   . Kidney stones   . DVT (deep venous thrombosis) ([place])   . PE (pulmonary embolism)     Past Surgical History  Procedure Laterality Date  . Abdominal hysterectomy    . Knee arthroscopy    . Cesarean section  05/1991  . Breath tek h pylori  07/28/2011    Procedure: BREATH TEK H PYLORI;  Surgeon: [name], MD;  Location: [facility];  Service: General;  Laterality: N/A;  to be done at 745  . Esophageal biopsy  02/16/2012    Procedure: BIOPSY;  Surgeon: [name], MD;  Location: [facility];  Service: General;;  biopsy of mass x 2  . Eus  03/03/2012    Procedure: UPPER ENDOSCOPIC ULTRASOUND (EUS) LINEAR;  Surgeon: Milus Banister, MD;  Location: [facility];  Service: Endoscopy;  Laterality: N/A;  . Tubal ligation    . Laparoscopic gastrectomy  04/08/2012    Procedure: LAPAROSCOPIC GASTRECTOMY;  Surgeon: [name], MD;  [facility]: [facility];  Service: General;;  removal of GIST tumor of stomach  . Laparoscopic gastric sleeve resection N/A  08/29/2012    Procedure: LAPAROSCOPIC GASTRIC SLEEVE RESECTION;  Surgeon: [name], MD;  [facility];  Service: General;  Laterality: N/A;  Sleeve Gastrectomy  . Esophagogastroduodenoscopy N/A 08/29/2012    Procedure: ESOPHAGOGASTRODUODENOSCOPY (EGD);  Surgeon: [name], MD;  [facility]: [facility];  Service: General;  Laterality: N/A;  . Total knee arthroplasty Left 10/06/2013    Procedure: LEFT TOTAL KNEE ARTHROPLASTY;  Surgeon: Mcarthur Rossetti, MD;  Location: [facility];  Service: Orthopedics;  Laterality: Left;  . Esophagogastroduodenoscopy N/A 09/04/2014    Procedure: ESOPHAGOGASTRODUODENOSCOPY (EGD);  Surgeon: Alphonsa Overall, MD;  Location: [facility];  Service: General;  Laterality: N/A;    There were no vitals filed for this visit.  Visit Diagnosis:  Weakness of left lower extremity  Difficulty in walking  Stiffness of knee joint, left  Localized edema      Subjective Assessment - 04/16/15 0809    Subjective Patient arrives 6 min late.  Reports stiffness but no pain today.     Currently in Pain? No/denies   Pain Location Knee   Pain Orientation Left   Pain Type Chronic pain  [place] Adult PT Treatment/Exercise - 04/16/15 0001    Knee/Hip Exercises: Stretches   Other Knee/Hip Stretches B LEs on red ball hip/knee flexion 2x 10   Knee/Hip Exercises: Aerobic   Nustep L 5, 8 min for ROM, strength    Knee/Hip Exercises: Standing   Heel Raises Both;1 set   SLS with Vectors 10x WB on left   Other Standing Knee Exercises sidestepping R/L 10 feet x5   Knee/Hip Exercises: Supine   Other Supine Knee/Hip Exercises HS sets on red ball 10x   Knee/Hip Exercises: Sidelying   Clams 2x10 no resistance   Manual Therapy   Joint Mobilization Left knee flexion mob with overpressure grade 3 10x   Soft tissue mobilization HS stretch contract relax 3x 5 sec holds with HS soft tissue mob   Myofascial Release Quads manual                 PT Education - 04/16/15 1457    Education provided Yes   Education Details knee flexion every 2 hours; clams   Person(s) Educated Patient   Methods Explanation;Demonstration;Handout   Comprehension Verbalized understanding;Returned demonstration          PT Short Term Goals - 04/16/15 0813    PT SHORT TERM GOAL #1   Title She is independent with inital HEP for knee   Time 2   Period Weeks   Status On-going   PT SHORT TERM GOAL #2   Title Pt will be able to bend knee to 100 deg with greater ease in stting    Time 2   Period Weeks   Status On-going   PT SHORT TERM GOAL #3   Title Pt will report stiffness , pain in knee 20-25% better   Time 3   Period Weeks   Status On-going           PT Long Term Goals - 04/16/15 0813    PT LONG TERM GOAL #1   Title She will be independent with all HEP issued as of last visit   Time 6   Period Weeks   Status On-going   PT LONG TERM GOAL #2   Title Pt will be able to walk for 30 min and report no more than min pain 3/10.    Time 6   Period Weeks   Status On-going   PT LONG TERM GOAL #3   Title Pt will be able to go up and down stairs reciprocally with min knee pain.    Time 6   Period Weeks   Status On-going   PT LONG TERM GOAL #4   Time 6   Period Weeks   Status On-going   PT LONG TERM GOAL #5   Title She will be able to demo understanding of good posture   Time 6   Period Weeks   Status On-going               Plan - 04/16/15 1457    Clinical Impression Statement The patient continues to be very weak in quad muscles.  She stands with knee in hyperextension.  She states she has had knee instability issues since she was 44 years old and she continues to be fearful of knee give-way.  She states she has had some knee buckling since her TKR.  In standing, verbal and tactile cues to avoid knee hyperextension.  Needs bilateral UE support for safety.  Fatigues quickly with weight bearing exercises.   Weakness in left  gluteals as well. Continue with treatment plan.     PT Next Visit Plan Nu-Step; left gluteal and quad muscle strengthening in open and closed chain.       G codes:  Mobility current and discharge CK                               Goal CJ  PHYSICAL THERAPY DISCHARGE SUMMARY  Visits from Start of Care: 2  Current functional level related to goals / functional outcomes: The patient did not return after 2nd visit for reasons unknown.  Her chart has been inactive for > 2 months.     Remaining deficits: As above   Education / Equipment: Basic HEP Plan: Patient agrees to discharge.  Patient goals were not met. Patient is being discharged due to not returning since the last visit.  ?????        Problem List Patient Active Problem List   Diagnosis Date Noted  . 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 Chronic neck and back pain 02/27/2015  . Other chest pain 12/10/2014  . Thrombocytosis ([place]) 08/29/2014  . Medication management 08/22/2014  . Hematuria 08/22/2014  . Pain in joint, ankle and foot 05/23/2014  . Chronic anticoagulation 05/17/2014  . Chronic thoracic back pain 04/11/2014  . Right flank pain 01/17/2014  . DVT, bilateral lower limbs ([place]) 10/08/2013  . Degenerative arthritis of left knee 10/06/2013  . Status post total knee replacement 10/06/2013  . Edema 07/28/2013  . Chronic pain 06/13/2013  . 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 Bipolar disorder ([place]) 01/19/2012  . Nephrolithiasis 02/20/2011  . Eczematous dermatitis 10/22/2010  . Iron deficiency anemia 06/27/2010  . Anxiety 06/27/2010  . Migraine 09/12/2009  . Morbid obesity-BMI 62 10/30/2008  . Osteoarthrosis, unspecified whether generalized or localized, involving lower leg 07/05/2007  . DEPRESSION, MAJOR, RECURRENT 07/01/2006  . HYPERTENSION, BENIGN SYSTEMIC 07/01/2006  . RHINITIS, ALLERGIC 07/01/2006  . GASTROESOPHAGEAL REFLUX, NO  ESOPHAGITIS 07/01/2006    Alvera Singh 04/16/2015, 3:02 PM  [facility] [address] [place], [place], [address] Phone: [phone]   Fax:  [phone]  Name: Crystal Garza MRN: [id number] Date of Birth: July 11, 1970    Ruben Im, PT 04/16/2015 3:02 PM Phone: [phone] Fax: [phone]

## 2015-04-17 ENCOUNTER — Ambulatory Visit (INDEPENDENT_AMBULATORY_CARE_PROVIDER_SITE_OTHER): Payer: 59 | Admitting: Psychiatry

## 2015-04-17 ENCOUNTER — Other Ambulatory Visit: Payer: Self-pay | Admitting: Family Medicine

## 2015-04-17 ENCOUNTER — Encounter (HOSPITAL_COMMUNITY): Payer: Self-pay | Admitting: Psychiatry

## 2015-04-17 VITALS — BP 110/65 | HR 86 | Ht 67.0 in | Wt 337.5 lb

## 2015-04-17 DIAGNOSIS — F3130 Bipolar disorder, current episode depressed, mild or moderate severity, unspecified: Secondary | ICD-10-CM

## 2015-04-17 MED ORDER — LAMOTRIGINE 200 MG PO TABS: 200.0000 mg | ORAL_TABLET | Freq: Every day | ORAL | Status: DC
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
## 2015-04-18 ENCOUNTER — Encounter: Payer: Self-pay | Admitting: Physical Therapy

## 2015-04-19 ENCOUNTER — Encounter: Payer: Self-pay | Admitting: Physical Therapy

## 2015-04-22 ENCOUNTER — Encounter: Payer: Self-pay | Admitting: Physical Therapy

## 2015-04-24 ENCOUNTER — Encounter: Payer: Self-pay | Admitting: Physical Therapy

## 2015-04-25 ENCOUNTER — Ambulatory Visit: Payer: Medicare Other | Admitting: Physical Therapy

## 2015-04-26 ENCOUNTER — Ambulatory Visit: Payer: Medicare Other | Admitting: Physical Therapy

## 2015-05-01 ENCOUNTER — Ambulatory Visit: Payer: Medicare Other | Admitting: Physical Therapy

## 2015-05-02 ENCOUNTER — Ambulatory Visit: Payer: Medicare Other | Admitting: Physical Therapy

## 2015-05-29 ENCOUNTER — Other Ambulatory Visit: Payer: Self-pay | Admitting: Family Medicine

## 2015-06-05 ENCOUNTER — Ambulatory Visit (INDEPENDENT_AMBULATORY_CARE_PROVIDER_SITE_OTHER): Payer: Medicare Other | Admitting: Family Medicine

## 2015-06-05 ENCOUNTER — Encounter: Payer: Self-pay | Admitting: Family Medicine

## 2015-06-05 VITALS — BP 124/67 | HR 82 | Temp 97.8°F | Ht 67.0 in | Wt 335.3 lb

## 2015-06-05 DIAGNOSIS — I10 Essential (primary) hypertension: Secondary | ICD-10-CM

## 2015-06-05 DIAGNOSIS — G8929 Other chronic pain: Secondary | ICD-10-CM

## 2015-06-05 MED ORDER — OXYCODONE-ACETAMINOPHEN 7.5-325 MG PO TABS: ORAL_TABLET | ORAL | Status: DC

## 2015-06-05 NOTE — Assessment & Plan Note (Signed)
I urged her again she to see if she can find a therapist or psychologist. She needs to follow-up with psychiatry.

## 2015-06-05 NOTE — Progress Notes (Signed)
   Subjective:    Patient ID: Crystal Garza, female    DOB: [birth year], 45 y.o.   MRN: [id number]  HPI  1. Increased stressors. Has been unable to find a therapist or psychologist at her current psychiatrist's office despite being on the waiting list since September or October. She is considering trying a different office. Has some questions about that. No medication changes #2. Chronic knee pain. She's gained a little bit of weight secondary distress in her knee is bothering her more. 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 Having aching within the extended periods of standing or walking. #3. Hypertension: Taking her medicines regularly without problem. No chest pain or shortness of breath. #4. Still having mid upper back pain. She went to physical therapy and that seemed to help but with her increased weight gain recently and her increased stressors, the pain has become problematic once again. Does not radiate to her chest. Worse with standing.  Review of Systems  has had some increased unexpected weight gain. No fevers. Knee pain as per history of present illness. Increased stress. History of present illness. Denies suicidal or homicidal ideation. She has had increased tearfulness.    Objective:   Physical Exam  vital signs are reviewed GEN.: Well-developed obese female no acute distress PSYCHIATRIC: Alert and oriented 4. Affect is interactive, sometimes tearful today. Asks and answers questions appropriate. Neatly dressed with good eye contact. Memory is intact for remote and recent events. Speech is normal and fluency in content. BACK: Tender to palpation along the rhomboid muscle area. No deformity. KNEE: Left. 4 range of motion in flexion and extension. Joint line is nontender. She has some tenderness over the quadricep tendon and patellar tendon. Popliteal spaces benign calf is soft.        Assessment & Plan:

## 2015-06-05 NOTE — Assessment & Plan Note (Addendum)
Good control. Refilled her medicines the other day. We'll check some blood work for kidney function etc. at next office visit in 3 months.

## 2015-06-05 NOTE — Patient Instructions (Signed)
I agree you should focus on getting back into a therapist office. Let me know if there is anything  You need from [name].  I will see you back in 3 months, or sooner if you need to.

## 2015-06-05 NOTE — Assessment & Plan Note (Addendum)
Continued problems with left knee pain despite TKR, mid back pain and rhomboid area. At last office visit we talked about possible breast reduction surgery. We cannot find a Psychologist, sport and exercise in town who is in her network. I told her to give the insurance company a call. I do think breast reduction would improve her mid back pain and likely weight loss would reduce some of her other muscle skeletal issues. Given her current level of stressors, I don't think weight loss is going to occur the next near future. Rx for 3 m and f/u then and PRN

## 2015-07-03 ENCOUNTER — Ambulatory Visit: Payer: Self-pay | Admitting: Dietician

## 2015-07-17 ENCOUNTER — Telehealth: Payer: Self-pay | Admitting: Family Medicine

## 2015-07-17 ENCOUNTER — Ambulatory Visit (HOSPITAL_COMMUNITY): Payer: Self-pay | Admitting: Psychiatry

## 2015-07-17 NOTE — Telephone Encounter (Signed)
Pt would like copy of TB skin test.  Please call her when ready for pickup

## 2015-07-18 NOTE — Telephone Encounter (Signed)
Letter printed and placed up front for pt to pick up. Katharina Caper, April D, [place]

## 2015-07-18 NOTE — Telephone Encounter (Signed)
Please close encounter

## 2015-08-07 ENCOUNTER — Ambulatory Visit (INDEPENDENT_AMBULATORY_CARE_PROVIDER_SITE_OTHER): Payer: 59 | Admitting: Psychiatry

## 2015-08-07 ENCOUNTER — Encounter (HOSPITAL_COMMUNITY): Payer: Self-pay | Admitting: Psychiatry

## 2015-08-07 VITALS — BP 108/64 | HR 68 | Ht 67.0 in | Wt 338.2 lb

## 2015-08-07 DIAGNOSIS — F3130 Bipolar disorder, current episode depressed, mild or moderate severity, unspecified: Secondary | ICD-10-CM | POA: Diagnosis not present

## 2015-08-07 MED ORDER — HYDROXYZINE PAMOATE 25 MG PO CAPS: 25.0000 mg | ORAL_CAPSULE | Freq: Every day | ORAL | Status: DC

## 2015-08-07 MED ORDER — RISPERIDONE 1 MG PO TABS: 1.0000 mg | ORAL_TABLET | Freq: Every day | ORAL | Status: DC

## 2015-08-07 MED ORDER — LAMOTRIGINE 200 MG PO TABS: 200.0000 mg | ORAL_TABLET | Freq: Every day | ORAL | Status: DC
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
## 2015-08-12 DIAGNOSIS — M545 Low back pain: Secondary | ICD-10-CM | POA: Diagnosis not present

## 2015-08-12 DIAGNOSIS — M47816 Spondylosis without myelopathy or radiculopathy, lumbar region: Secondary | ICD-10-CM | POA: Diagnosis not present

## 2015-08-15 DIAGNOSIS — Z9884 Bariatric surgery status: Secondary | ICD-10-CM | POA: Diagnosis not present

## 2015-08-21 DIAGNOSIS — N62 Hypertrophy of breast: Secondary | ICD-10-CM | POA: Diagnosis not present

## 2015-08-21 DIAGNOSIS — M545 Low back pain: Secondary | ICD-10-CM | POA: Diagnosis not present

## 2015-08-21 DIAGNOSIS — M546 Pain in thoracic spine: Secondary | ICD-10-CM | POA: Diagnosis not present

## 2015-08-21 DIAGNOSIS — G8929 Other chronic pain: Secondary | ICD-10-CM | POA: Diagnosis not present

## 2015-08-22 DIAGNOSIS — M47816 Spondylosis without myelopathy or radiculopathy, lumbar region: Secondary | ICD-10-CM | POA: Diagnosis not present

## 2015-08-22 DIAGNOSIS — M545 Low back pain: Secondary | ICD-10-CM | POA: Diagnosis not present

## 2015-08-23 ENCOUNTER — Ambulatory Visit (INDEPENDENT_AMBULATORY_CARE_PROVIDER_SITE_OTHER): Payer: Medicare Other | Admitting: Podiatry

## 2015-08-23 ENCOUNTER — Encounter: Payer: Self-pay | Admitting: Podiatry

## 2015-08-23 ENCOUNTER — Ambulatory Visit (INDEPENDENT_AMBULATORY_CARE_PROVIDER_SITE_OTHER): Payer: Medicare Other

## 2015-08-23 VITALS — BP 113/76 | HR 89 | Resp 12

## 2015-08-23 DIAGNOSIS — M79672 Pain in left foot: Secondary | ICD-10-CM

## 2015-08-23 DIAGNOSIS — M799 Soft tissue disorder, unspecified: Secondary | ICD-10-CM | POA: Diagnosis not present

## 2015-08-23 DIAGNOSIS — M898X9 Other specified disorders of bone, unspecified site: Secondary | ICD-10-CM | POA: Diagnosis not present

## 2015-08-23 DIAGNOSIS — M7989 Other specified soft tissue disorders: Secondary | ICD-10-CM

## 2015-08-23 NOTE — Progress Notes (Signed)
   Subjective:    Patient ID: Dallas Schimke, female    DOB: [birth year], 45 y.o.   MRN: [id number]  HPI 45 year old female presents the office they for concerns of not on the side of her left foot which is been ongoing for the last couple of months. She denies any recent injury or trauma. There is painful but he is a pressure in shoe gear. She denies any recent injury or trauma. No tingling or numbness. No other treatment at this time. No other complaints. Review of Systems  Musculoskeletal: Positive for back pain and gait problem.       Objective:   Physical Exam General: AAO x3, NAD  Dermatological:  There are no open sores, no preulcerative lesions, no rash or signs of infection present.  Vascular: Dorsalis Pedis artery and Posterior Tibial artery pedal pulses are 2/4 bilateral with immedate capillary fill time. Pedal hair growth present. No varicosities and no lower extremity edema present bilateral. There is no pain with calf compression, swelling, warmth, erythema.   Neruologic: Grossly intact via light touch bilateral. Vibratory intact via tuning fork bilateral. Protective threshold with Semmes Wienstein monofilament intact to all pedal sites bilateral.   Musculoskeletal: There is tenderness palpation on the dorsal lateral aspect of the left foot approximately level of the fourth metatarsal cuneiform joint and small bony exostosis palpable the small overlying soft tissue mass. There is no overlying skin change or erythema. Tenderness with a around this area. There is no other areas of tenderness bilaterally. MMT 5/5.  Gait: Unassisted, Nonantalgic.      Assessment & Plan:  45 year old female soft tissue mass/exostosis left lateral foot -Treatment options discussed including all alternatives, risks, and complications -Etiology of symptoms were discussed -X-rays were obtained and reviewed with the patient. No appreciable soft tissue mass present. No evidence of acute fracture or  stress fracture. -I discussed steroid injection to the area of maximal tenderness. Discussed steroid injection for which she wishes to proceed with admission he risks complications. Under sterile conditions a mixture of Kenalog as well as local anesthetic was infiltrated around the area of maximal tenderness without complications. Post injection care was discussed. Offloading pads were dispensed. Also discussed with her tire she is help take pressure off the area. Follow-up in 3 weeks if symptoms continue or sooner if any issues are to arise.  Celesta Gentile, DPM

## 2015-08-28 ENCOUNTER — Ambulatory Visit (INDEPENDENT_AMBULATORY_CARE_PROVIDER_SITE_OTHER): Payer: Medicare Other | Admitting: Family Medicine

## 2015-08-28 ENCOUNTER — Encounter: Payer: Self-pay | Admitting: Family Medicine

## 2015-08-28 VITALS — BP 126/68 | HR 96 | Temp 98.4°F | Ht 67.0 in | Wt 336.5 lb

## 2015-08-28 DIAGNOSIS — N62 Hypertrophy of breast: Secondary | ICD-10-CM

## 2015-08-28 DIAGNOSIS — F3170 Bipolar disorder, currently in remission, most recent episode unspecified: Secondary | ICD-10-CM

## 2015-08-28 DIAGNOSIS — I10 Essential (primary) hypertension: Secondary | ICD-10-CM | POA: Diagnosis not present

## 2015-08-28 MED ORDER — TIZANIDINE HCL 4 MG PO TABS: 4.0000 mg | ORAL_TABLET | Freq: Two times a day (BID) | ORAL | Status: DC | PRN

## 2015-08-28 MED ORDER — OXYCODONE-ACETAMINOPHEN 7.5-325 MG PO TABS: ORAL_TABLET | ORAL | Status: DC

## 2015-08-28 MED ORDER — RIVAROXABAN 20 MG PO TABS
20.0000 mg | ORAL_TABLET | Freq: Every day | ORAL | Status: DC
Start: 2015-08-28 — End: 2016-08-05

## 2015-08-28 MED ORDER — LISINOPRIL 10 MG PO TABS: 10.0000 mg | ORAL_TABLET | Freq: Every day | ORAL | Status: DC

## 2015-08-29 DIAGNOSIS — Z9884 Bariatric surgery status: Secondary | ICD-10-CM | POA: Diagnosis not present

## 2015-08-30 NOTE — Assessment & Plan Note (Signed)
We had some trouble getting her into the appropriate surgeon given her insurance. She still checking that out. Very made the referral be happy update that once she finds out issues on her insurance list. I do think that would certainly help with some of her chronic upper back and neck pain. It might also help with her chronic knee pain

## 2015-08-30 NOTE — Assessment & Plan Note (Signed)
Stable. Refills.

## 2015-08-30 NOTE — Progress Notes (Signed)
   Subjective:    Patient ID: Crystal Garza, female    DOB: 13-May-1970, 45 y.o.   MRN: [id number]  HPI Follow-up chronic arthralgias specifically left knee pain after TKR. Not improving in her ability to do any flexion. Still has a lot of aching pain when she stands which is necessary for her job. Has been doing well on the current pain medicine regimen. Decreases her pain from a 7 out of 10 to about a 3 out of 10. #2. Doing better with her mood. She's taking her psychiatric medications more regularly. #3. Needs refills on her blood pressure medicine. No problems with that #4. Questions about referral for breast reduction. She continues to have upper back pain.   Review of Systems No unusual weight change, fever, sweats, chills.    Objective:   Physical Exam  Vital signs reviewed. GENERAL: Well-developed, well-nourished, no acute distress. CARDIOVASCULAR: Regular rate and rhythm no murmur gallop or rub LUNGS: Clear to auscultation bilaterally, no rales or wheeze. ABDOMEN: Soft positive bowel sounds NEURO: No gross focal neurological deficits. MSK: Movement of extremity x 4.Left knee full range of extension, flexion is limited to about 20. No effusion.        Assessment & Plan:

## 2015-08-30 NOTE — Assessment & Plan Note (Signed)
Encouraged her to continue taking her medicine more regularly. She does seem to be sleeping better. I would like her to to continue to find a behavioral counselor or therapist as I think she would benefit from that.

## 2015-09-05 ENCOUNTER — Ambulatory Visit (HOSPITAL_COMMUNITY): Payer: Self-pay | Admitting: Psychology

## 2015-09-11 ENCOUNTER — Ambulatory Visit: Payer: Self-pay | Admitting: Family Medicine

## 2015-09-11 DIAGNOSIS — Z903 Acquired absence of stomach [part of]: Secondary | ICD-10-CM | POA: Diagnosis not present

## 2015-09-12 ENCOUNTER — Other Ambulatory Visit: Payer: Self-pay | Admitting: Surgery

## 2015-09-12 DIAGNOSIS — Z9884 Bariatric surgery status: Secondary | ICD-10-CM

## 2015-09-18 ENCOUNTER — Other Ambulatory Visit: Payer: Self-pay

## 2015-09-24 ENCOUNTER — Ambulatory Visit
Admission: RE | Admit: 2015-09-24 | Discharge: 2015-09-24 | Disposition: A | Discharge status: Home / Self Care | Payer: Medicare Other | Source: Ambulatory Visit | Attending: Surgery | Admitting: Surgery

## 2015-09-24 DIAGNOSIS — R109 Unspecified abdominal pain: Secondary | ICD-10-CM | POA: Diagnosis not present

## 2015-09-24 DIAGNOSIS — Z9884 Bariatric surgery status: Secondary | ICD-10-CM

## 2015-09-24 MED ORDER — IOPAMIDOL (ISOVUE-300) INJECTION 61%
125.0000 mL | Freq: Once | INTRAVENOUS | Status: AC | PRN
  Administered 2015-09-24: 125 mL via INTRAVENOUS

## 2015-10-02 ENCOUNTER — Encounter (HOSPITAL_COMMUNITY): Payer: Self-pay | Admitting: Psychology

## 2015-10-02 ENCOUNTER — Ambulatory Visit (HOSPITAL_COMMUNITY): Payer: Self-pay | Admitting: Psychology

## 2015-10-02 NOTE — Progress Notes (Signed)
Crystal Garza is a 45 y.o. female patient who didn't show for her new pt appointment.  Dr. Adele Schilder referring provider informed. Marland Kitchen        Jan Fireman, LPC

## 2015-10-03 DIAGNOSIS — K2950 Unspecified chronic gastritis without bleeding: Secondary | ICD-10-CM | POA: Diagnosis not present

## 2015-10-07 ENCOUNTER — Ambulatory Visit (INDEPENDENT_AMBULATORY_CARE_PROVIDER_SITE_OTHER): Payer: Medicare Other | Admitting: *Deleted

## 2015-10-07 DIAGNOSIS — Z111 Encounter for screening for respiratory tuberculosis: Secondary | ICD-10-CM

## 2015-10-07 NOTE — Progress Notes (Signed)
Patient ID: Crystal Garza, female   DOB: 1970-11-23, 45 y.o.   MRN: [id number]  Tuberculin skin test applied to Left ventral forearm, pt tolerated well.  Pt will return 10/09/15 to have PPD read. Fleeger, Salome Spotted, CMA

## 2015-10-09 ENCOUNTER — Ambulatory Visit (INDEPENDENT_AMBULATORY_CARE_PROVIDER_SITE_OTHER): Payer: Medicare Other | Admitting: *Deleted

## 2015-10-09 ENCOUNTER — Encounter: Payer: Self-pay | Admitting: *Deleted

## 2015-10-09 DIAGNOSIS — Z7689 Persons encountering health services in other specified circumstances: Secondary | ICD-10-CM

## 2015-10-09 DIAGNOSIS — Z111 Encounter for screening for respiratory tuberculosis: Secondary | ICD-10-CM

## 2015-10-09 LAB — TB SKIN TEST
Induration: 0 mm
TB Skin Test: NEGATIVE

## 2015-10-09 NOTE — Progress Notes (Signed)
   PPD Reading Note PPD read and results entered in EpicCare. Result: 0 mm induration. Interpretation: Negative If test not read within 48-72 hours of initial placement, patient advised to repeat in other arm 1-3 weeks after this test. Allergic reaction: no  Birtha Hatler L, RN  

## 2015-10-23 ENCOUNTER — Ambulatory Visit (INDEPENDENT_AMBULATORY_CARE_PROVIDER_SITE_OTHER): Payer: Medicare Other | Admitting: Family Medicine

## 2015-10-23 ENCOUNTER — Encounter: Payer: Self-pay | Admitting: Family Medicine

## 2015-10-23 VITALS — BP 129/55 | HR 80 | Temp 98.6°F | Ht 67.0 in | Wt 345.8 lb

## 2015-10-23 DIAGNOSIS — C49A2 Gastrointestinal stromal tumor of stomach: Secondary | ICD-10-CM

## 2015-10-23 DIAGNOSIS — G8929 Other chronic pain: Secondary | ICD-10-CM

## 2015-10-23 MED ORDER — OXYCODONE-ACETAMINOPHEN 7.5-325 MG PO TABS: ORAL_TABLET | ORAL | Status: DC

## 2015-10-23 MED ORDER — TIZANIDINE HCL 4 MG PO TABS: 4.0000 mg | ORAL_TABLET | Freq: Two times a day (BID) | ORAL | Status: DC | PRN

## 2015-10-25 NOTE — Progress Notes (Signed)
   Subjective:    Patient ID: Crystal Garza, female    DOB: 06/12/1970, 45 y.o.   MRN: [id number]  HPI [name] schedule for CPE but was late for today's appointment and says she doesn't really want to deal with all of that today anyway. #1. Continues to have burning sensation in her stomach area. This concerns her for return of her tumor. She recently saw her general surgeon who did a CT scan and everything was reportedly normal. She has questions about the reports she received on that #2. Macromastia: She is set up to see the breast surgeon #3. Bilateral knee pain left greater than right. Needs refills on her chronic pain medicine   Review of Systems Has had some unintentional weight gain. No fever, sweats, chills. No nausea, vomiting, diarrhea.    Objective:   Physical Exam  Vital signs are reviewed gen.: Well-developed overweight female no acute distress ABDOMEN: SOFT, POSITIVE BOWEL SOUNDS, NONTENDER NONDISTENDED NO REBOUND OR GUARDING. CV: REGULAR RATE AND RHYTHM KNEES: LEFT KNEE TENDER TO PALPATION MEDIAL JOINT LINE. NO EFFUSION.      Assessment & Plan:  #1. History of GI ST tumor. We'll set her up for appointment with gastroenterology for possible EGD. Next #2. Continued chronic pain medication for her knees neck slight #3. Follow-up one month and we'll do her physical.

## 2015-11-07 ENCOUNTER — Ambulatory Visit (HOSPITAL_COMMUNITY): Payer: Self-pay | Admitting: Psychiatry

## 2015-11-08 ENCOUNTER — Ambulatory Visit (INDEPENDENT_AMBULATORY_CARE_PROVIDER_SITE_OTHER): Payer: Medicare Other | Admitting: Internal Medicine

## 2015-11-08 ENCOUNTER — Encounter: Payer: Self-pay | Admitting: Internal Medicine

## 2015-11-08 VITALS — BP 124/42 | HR 89 | Temp 98.0°F | Ht 67.0 in | Wt 344.0 lb

## 2015-11-08 DIAGNOSIS — R42 Dizziness and giddiness: Secondary | ICD-10-CM | POA: Diagnosis not present

## 2015-11-08 MED ORDER — MECLIZINE HCL 32 MG PO TABS: 32.0000 mg | ORAL_TABLET | Freq: Three times a day (TID) | ORAL | Status: DC | PRN

## 2015-11-08 NOTE — Progress Notes (Signed)
Subjective:    Crystal Garza - 45 y.o. female MRN [id number]  Date of birth: 08-Apr-1971  HPI  Crystal Garza is here for same day appointment for dizziness.   DIZZINESS  Feeling dizzy for 1 days. Started around 6:30 am. Still feeling this way.  Dizziness is Feels like head is swimming. Feels off-balanced.  Feels like room spins: No  Lightheadedness when stands: Yes  Palpitations or heart racing: No  Prior dizziness: Occurred with medication use: anxiety medication (unsure which one). Dizziness resolved after d/c of medication.  Medications tried: hasn't tried anything  Taking blood thinners: Xarelto  No recent changes in medications or increase in dose in BP medications.   Symptoms Hearing Loss: No  Ear Pain or fullness: No. Denies tinnitus.  Nausea or vomiting: No  Vision difficulty or double vision: No  Falls: No  Head trauma: No  Weakness in arm or leg: No  Speaking problems: No  Headache: No  No recent URI symptoms.   ROS see HPI Smoking Status noted  -  reports that she has never smoked. She has never used smokeless tobacco. - Review of Systems: Per HPI. - Past Medical History: Patient Active Problem List   Diagnosis Date Noted  . Dizziness 11/08/2015  . 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 Chronic neck and back pain 02/27/2015  . Other chest pain 12/10/2014  . Thrombocytosis ([place]) 08/29/2014  . Medication management 08/22/2014  . Hematuria 08/22/2014  . Pain in joint, ankle and foot 05/23/2014  . Chronic anticoagulation 05/17/2014  . Chronic thoracic back pain 04/11/2014  . Right flank pain 01/17/2014  . DVT, bilateral lower limbs ([place]) 10/08/2013  . Degenerative arthritis of left knee 10/06/2013  . Status post total knee replacement 10/06/2013  . Edema 07/28/2013  . Chronic pain 06/13/2013  . 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 Bipolar disorder ([place]) 01/19/2012  . Nephrolithiasis  02/20/2011  . Eczematous dermatitis 10/22/2010  . Iron deficiency anemia 06/27/2010  . Anxiety 06/27/2010  . Migraine 09/12/2009  . Morbid obesity-BMI 62 10/30/2008  . Osteoarthrosis, unspecified whether generalized or localized, involving lower leg 07/05/2007  . DEPRESSION, MAJOR, RECURRENT 07/01/2006  . HYPERTENSION, BENIGN SYSTEMIC 07/01/2006  . RHINITIS, ALLERGIC 07/01/2006  . GASTROESOPHAGEAL REFLUX, NO ESOPHAGITIS 07/01/2006   - Medications: reviewed and updated    Objective:   Physical Exam BP 124/42 mmHg  Pulse 89  Temp(Src) 98 F (36.7 C) (Oral)  Ht 5\' 7"  (1.702 m)  Wt 344 lb (156.037 kg)  BMI 53.87 kg/m2 Gen: NAD, alert, cooperative with exam, well-appearing HEENT: TM normal bilaterally with good light reflex, no erythema, and no bulging. Oropharynx clear.  CV: RRR, good S1/S2, no murmur Resp: CTABL, no wheezes, non-labored Abd: SNTND, BS present, no guarding or organomegaly Neuro: One beat of horizontal nystagmus to the left appreciated. CN II-XII intact. Strength 5/5 in all extremities. Dix-hallpike negative. HINTs exam normal.  Psych: good insight, alert and oriented      Assessment & Plan:   Dizziness Unclear etiology of dizziness. Given results of HINTs exam most likely a peripheral cause of vertigo. Benign neuro exam and lack of headache makes central cause such as TIA or increased ICP less likely.  Doubt Meniere's or schwanoma given lack of hearing changes or tinnitus. Potentially BPPV although Dix-Hallpike was negative.  -will treat with course of Meclizine -return if dizziness continues despite Meclizine  -could consider orthostatic vitals if dizziness persists  Phill Myron, D.O. 11/08/2015, 3:34 PM PGY-2, [place]

## 2015-11-08 NOTE — Assessment & Plan Note (Signed)
Unclear etiology of dizziness. Given results of HINTs exam most likely a peripheral cause of vertigo. Benign neuro exam and lack of headache makes central cause such as TIA or increased ICP less likely.  Doubt Meniere's or schwanoma given lack of hearing changes or tinnitus. Potentially BPPV although Dix-Hallpike was negative.  -will treat with course of Meclizine -return if dizziness continues despite Meclizine  -could consider orthostatic vitals if dizziness persists

## 2015-11-08 NOTE — Patient Instructions (Signed)
Take Meclizine as prescribed. If continue to have problems with dizziness despite Meclizine please return. If you start to having hearing loss, new onset of weakness, changes in speech please return.

## 2015-11-26 ENCOUNTER — Ambulatory Visit (HOSPITAL_COMMUNITY): Payer: Self-pay | Admitting: Psychiatry

## 2015-11-29 ENCOUNTER — Encounter (HOSPITAL_BASED_OUTPATIENT_CLINIC_OR_DEPARTMENT_OTHER): Payer: Self-pay

## 2015-11-29 ENCOUNTER — Ambulatory Visit (HOSPITAL_BASED_OUTPATIENT_CLINIC_OR_DEPARTMENT_OTHER): Admit: 2015-11-29 | Payer: Medicare Other | Admitting: Plastic Surgery

## 2015-11-29 SURGERY — MAMMOPLASTY, REDUCTION
Anesthesia: General | Laterality: Bilateral

## 2015-12-12 ENCOUNTER — Ambulatory Visit: Payer: Self-pay | Admitting: *Deleted

## 2015-12-18 ENCOUNTER — Telehealth: Payer: Self-pay | Admitting: Family Medicine

## 2015-12-18 DIAGNOSIS — C49A0 Gastrointestinal stromal tumor, unspecified site: Secondary | ICD-10-CM

## 2015-12-18 NOTE — Telephone Encounter (Signed)
Pt stated she is due for a colonoscopy. Pt would like to go back to [facility] on [address], pt went there five years ago. Please contact pt at [phone].Please advise. Thanks! ep

## 2015-12-19 NOTE — Telephone Encounter (Signed)
Dear [name] Team I have put the referral in. Please make sure that we are doing this right---it was for a COLONOSCOPY right? Not an EGD?  If that is correct I have placed referral THANKS! [name]

## 2015-12-20 NOTE — Telephone Encounter (Signed)
Contacted pt and told her that the referral had been placed and she said that was for a colonoscopy and endoscopy correct? I told her that I would confirm with Dr. Nori Riis but that she should hear from the GI doctors to schedule her and appointment. Routing to PCP to see if there is anything else that needs to be ordered.  Katharina Caper, April D, [place]

## 2015-12-23 ENCOUNTER — Telehealth: Payer: Self-pay | Admitting: Gastroenterology

## 2015-12-23 NOTE — Telephone Encounter (Signed)
-----   Message ----- From: Marlon Pel, RN Sent: 12/20/2015  10:57 AM To: Dow Adolph  Not urgent.  Recent CT was normal.  She has a history of GIST  ----- Message ----- From: Dow Adolph Sent: 12/20/2015   9:03 AM To: Barron Alvine, RN  Patty,  Could you look at this referral and tell me the urgency of it?  Thanks [employer]

## 2015-12-23 NOTE — Telephone Encounter (Signed)
I will placed this in the referral as well, patient is already scheduled with [facility] so I doubt it will make a difference. GI doc will order both procedures.

## 2015-12-23 NOTE — Telephone Encounter (Signed)
Tia Evidently she needs both---colonoscopy and endoscopy--probably for f/u her GIST (tumor). Do we need to specify in the referral or will GI figure it out?  Let me know if something else we need to do. As always.THANKS! Clarise Cruz

## 2015-12-31 DIAGNOSIS — M545 Low back pain: Secondary | ICD-10-CM | POA: Diagnosis not present

## 2015-12-31 DIAGNOSIS — M47816 Spondylosis without myelopathy or radiculopathy, lumbar region: Secondary | ICD-10-CM | POA: Diagnosis not present

## 2016-01-16 ENCOUNTER — Ambulatory Visit: Payer: Self-pay | Admitting: *Deleted

## 2016-01-16 ENCOUNTER — Other Ambulatory Visit (HOSPITAL_COMMUNITY): Payer: Self-pay | Admitting: Psychiatry

## 2016-01-16 DIAGNOSIS — F3130 Bipolar disorder, current episode depressed, mild or moderate severity, unspecified: Secondary | ICD-10-CM

## 2016-01-20 ENCOUNTER — Ambulatory Visit (HOSPITAL_COMMUNITY): Payer: Self-pay | Admitting: Psychiatry

## 2016-01-21 DIAGNOSIS — Z9884 Bariatric surgery status: Secondary | ICD-10-CM | POA: Diagnosis not present

## 2016-01-23 DIAGNOSIS — R197 Diarrhea, unspecified: Secondary | ICD-10-CM | POA: Diagnosis not present

## 2016-02-05 ENCOUNTER — Ambulatory Visit: Payer: Self-pay | Admitting: Family Medicine

## 2016-02-05 ENCOUNTER — Other Ambulatory Visit: Payer: Self-pay | Admitting: Family Medicine

## 2016-02-05 NOTE — Telephone Encounter (Signed)
Pt would like a refill on her pain medication left up front. jw

## 2016-02-05 NOTE — Telephone Encounter (Signed)
Pt is calling because she would like another doctor to write her prescription for Percocet and leave up front. She doesn't think she should have to wait to get this refilled . jw

## 2016-02-05 NOTE — Telephone Encounter (Signed)
Dear [name] Team You can ask preceptor OR I can do it tomorrow when I get back THANKS! [name]

## 2016-02-06 MED ORDER — OXYCODONE-ACETAMINOPHEN 7.5-325 MG PO TABS: ORAL_TABLET | ORAL | 0 refills | Status: DC

## 2016-02-06 NOTE — Telephone Encounter (Signed)
Dear [name] Team I have placed rx for 3 months up front for her to pick up Please let her know THANKS! [name]

## 2016-02-06 NOTE — Telephone Encounter (Signed)
LVM on cell phone for pt to call the office. If she calls, please inform her the Rx is up front for pick up. Ottis Stain, CMA

## 2016-02-10 ENCOUNTER — Encounter: Payer: Self-pay | Admitting: Nurse Practitioner

## 2016-02-10 ENCOUNTER — Encounter: Payer: Self-pay | Admitting: Family Medicine

## 2016-02-10 ENCOUNTER — Ambulatory Visit (INDEPENDENT_AMBULATORY_CARE_PROVIDER_SITE_OTHER): Payer: Medicare Other | Admitting: Nurse Practitioner

## 2016-02-10 ENCOUNTER — Telehealth: Payer: Self-pay | Admitting: *Deleted

## 2016-02-10 VITALS — BP 118/78 | HR 72 | Ht 67.0 in | Wt 340.5 lb

## 2016-02-10 DIAGNOSIS — K219 Gastro-esophageal reflux disease without esophagitis: Secondary | ICD-10-CM | POA: Diagnosis not present

## 2016-02-10 DIAGNOSIS — R110 Nausea: Secondary | ICD-10-CM | POA: Diagnosis not present

## 2016-02-10 DIAGNOSIS — Z800 Family history of malignant neoplasm of digestive organs: Secondary | ICD-10-CM

## 2016-02-10 DIAGNOSIS — R194 Change in bowel habit: Secondary | ICD-10-CM

## 2016-02-10 MED ORDER — PANTOPRAZOLE SODIUM 40 MG PO TBEC: 40.0000 mg | DELAYED_RELEASE_TABLET | Freq: Two times a day (BID) | ORAL | 3 refills | Status: DC

## 2016-02-10 MED ORDER — NA SULFATE-K SULFATE-MG SULF 17.5-3.13-1.6 GM/177ML PO SOLN: 1.0000 | Freq: Once | ORAL | 0 refills | Status: AC

## 2016-02-10 NOTE — Telephone Encounter (Signed)
   RE: MONTEEN MELIS DOB: 06-23-1970 MRN: [id number]   Dear Dr. [name],    We have scheduled the above patient for an endoscopic procedure. Our records show that she is on anticoagulation therapy.   Please advise as to how long the patient may come off her therapy of Xarelto prior to the procedure, which is scheduled for 03/03/2016.  Please fax back/ or route the completed form to [facility] at [phone].   Sincerely,    Tye Savoy NP-C

## 2016-02-10 NOTE — Patient Instructions (Signed)
We sent prescriptions to [facility]. 1. Pantoprazole sodium 40 mg.  2. Suprep for the colonoscopy.  We will call you once we hear from Dr. Nori Riis with the Xarelto instructions.  You have been scheduled for a colonoscopy. Please follow written instructions given to you at your visit today.  Please pick up your prep supplies at the pharmacy within the next 1-3 days. If you use inhalers (even only as needed), please bring them with you on the day of your procedure. Your physician has requested that you go to www.startemmi.com and enter the access code given to you at your visit today. This web site gives a general overview about your procedure. However, you should still follow specific instructions given to you by our office regarding your preparation for the procedure.

## 2016-02-10 NOTE — Progress Notes (Signed)
HPI: Patient is a 45 year old female referred by PCP [name], MD  for abdominal pain. Patient has MULTIPLE GI complaints. Patient had a GIST removed in 2013. In 2014 she underwent gastric sleeve placement. She saw surgery in May 2016 for epigastric pain, EGD by Surgeon to evaluate the sleeve was normal. Patient's father had CRC in his 33's.Marland Kitchen He had UC or crohn's, patient is unsure.   Sindy has chronic epigastric burning but over the last several weeks she has had postprandial nausea and LUQ pain described as contractions No vomiting. On a daily PPI for GERD, has added some Tums and Maalox without benefit.  She lost 93 pounds after gastric sleeve but has gained back 30 pounds.   Also over the last several weeks patient has been having loose stools. She can easily have loose stool depending on diet. C-diff ar surgery's office last month was negative. Last week patient had some blood in her stool but thinks it was from constipation.    Past Medical History:  Diagnosis Date  . Anemia    iron def  . Anginal pain ([place])   . Anxiety   . Arthritis    left knee   . Back pain   . Benign gastrointestinal stromal tumor (GIST)   . Chronic knee pain   . Depression   . DVT (deep venous thrombosis) ([place])   . GERD (gastroesophageal reflux disease)   . Hx of laparoscopic gastric banding   . Hypertension   . Kidney stones   . Migraine   . Morbid obesity ([place])   . PE (pulmonary embolism)     Past Surgical History:  Procedure Laterality Date  . ABDOMINAL HYSTERECTOMY    . BIOPSY  02/16/2012   Procedure: BIOPSY;  Surgeon: [name], MD;  Location: [facility];  Service: General;;  biopsy of mass x 2  . BREATH TEK H PYLORI  07/28/2011   Procedure: [place];  Surgeon: [name], MD;  Location: [facility];  Service: General;  Laterality: N/A;  to be done at 745  . CESAREAN SECTION  05/1991  . ESOPHAGOGASTRODUODENOSCOPY N/A 08/29/2012   Procedure:  ESOPHAGOGASTRODUODENOSCOPY (EGD);  Surgeon: [name], MD;  Location: [facility];  Service: General;  Laterality: N/A;  . ESOPHAGOGASTRODUODENOSCOPY N/A 09/04/2014   Procedure: ESOPHAGOGASTRODUODENOSCOPY (EGD);  Surgeon: Alphonsa Overall, MD;  Location: [facility];  Service: General;  Laterality: N/A;  . EUS  03/03/2012   Procedure: UPPER ENDOSCOPIC ULTRASOUND (EUS) LINEAR;  Surgeon: Milus Banister, MD;  Location: [facility];  Service: Endoscopy;  Laterality: N/A;  . gist    . KNEE ARTHROSCOPY    . LAPAROSCOPIC GASTRECTOMY  04/08/2012   Procedure: LAPAROSCOPIC GASTRECTOMY;  Surgeon: [name], MD;  Location: [facility];  Service: General;;  removal of GIST tumor of stomach  . LAPAROSCOPIC GASTRIC SLEEVE RESECTION N/A 08/29/2012   Procedure: LAPAROSCOPIC GASTRIC SLEEVE RESECTION;  Surgeon: [name], MD;  Location: [facility];  Service: General;  Laterality: N/A;  Sleeve Gastrectomy  . TOTAL KNEE ARTHROPLASTY Left 10/06/2013   Procedure: LEFT TOTAL KNEE ARTHROPLASTY;  Surgeon: Mcarthur Rossetti, MD;  Location: [facility];  Service: Orthopedics;  Laterality: Left;  . TUBAL LIGATION     Family History  Problem Relation Age of Onset  . Diabetes Mother   . Diabetes Father   . Heart disease Father   . Colon cancer Father     brain tumor  .  Colon polyps Father   . Colitis Father   . Irritable bowel syndrome Father   . Diabetes Maternal Grandmother   . Diabetes Maternal Grandfather   . Schizophrenia Maternal Grandfather   . Heart disease Paternal Uncle   . Diabetes Paternal Uncle   . Heart disease Paternal Grandmother   . Heart disease Paternal Grandfather   . Diabetes Maternal Aunt   . Diabetes Maternal Uncle   . Diabetes Paternal Aunt    Social History  Substance Use Topics  . Smoking status: Never Smoker  . Smokeless tobacco: Never Used  . Alcohol use 0.0 oz/week     Comment: maybe once a month   Current Outpatient Prescriptions  Medication Sig Dispense Refill  .  hydrOXYzine (VISTARIL) 25 MG capsule Take 1 capsule (25 mg total) by mouth at bedtime. 30 capsule 2  . lamoTRIgine (LAMICTAL) 200 MG tablet TAKE ONE TABLET AT BEDTIME. 30 tablet 0  . lisinopril (PRINIVIL,ZESTRIL) 10 MG tablet Take 1 tablet (10 mg total) by mouth daily. 90 tablet 3  . meclizine (ANTIVERT) 32 MG tablet Take 1 tablet (32 mg total) by mouth 3 (three) times daily as needed. 30 tablet 0  . omeprazole (PRILOSEC) 40 MG capsule Take 40 mg by mouth daily.    . ondansetron (ZOFRAN-ODT) 4 MG disintegrating tablet Take 4 mg by mouth 1 day or 1 dose.    . oxyCODONE-acetaminophen (PERCOCET) 7.5-325 MG tablet Take one tablet by mouth every 8 hours as needed for pain do not fill before April 07 2016 90 tablet 0  . pantoprazole (PROTONIX) 40 MG tablet Take 1 tablet (40 mg total) by mouth 2 (two) times daily. 180 tablet 3  . risperiDONE (RISPERDAL) 1 MG tablet Take 1 tablet (1 mg total) by mouth at bedtime. 30 tablet 12  . rivaroxaban (XARELTO) 20 MG TABS tablet Take 1 tablet (20 mg total) by mouth daily with supper. 90 tablet 3  . tiZANidine (ZANAFLEX) 4 MG tablet Take 1 tablet (4 mg total) by mouth 2 (two) times daily as needed for muscle spasms. 60 tablet 12  . VESICARE 5 MG tablet Take 5 mg by mouth daily.    . Na Sulfate-K Sulfate-Mg Sulf 17.5-3.13-1.6 GM/180ML SOLN Take 1 kit by mouth once. 354 mL 0   No current facility-administered medications for this visit.    Allergies  Allergen Reactions  . Codeine Rash     Review of Systems: Positive for anxiety, arthritis, back pain, depression and headaches. All other systems reviewed and negative except where noted in HPI.   Physical Exam: BP 118/78   Pulse 72   Ht '5\' 7"'  (1.702 m)   Wt (!) 340 lb 8 oz (154.4 kg)   BMI 53.33 kg/m  Constitutional: obese, black female in NAD.  HEENT: Normocephalic and atraumatic. Conjunctivae are normal. No scleral icterus. Neck supple.  Cardiovascular: Normal rate, regular rhythm.  Pulmonary/chest:  Effort normal and breath sounds normal. No wheezing, rales or rhonchi. Abdominal: Soft,obese, nondistended, nontender. Bowel sounds active throughout. There are no masses palpable. No hepatomegaly. Extremities: no edema Lymphadenopathy: No cervical adenopathy noted. Neurological: Alert and oriented to person place and time. Skin: Skin is warm and dry. No rashes noted. Psychiatric:Pleasant but easily agitated.    ASSESSMENT AND PLAN:  45 year old female with severl week history of bowel changes with at least one episode of rectal bleeding. [facility] CRC in father in his 76s. Her last colonoscopy in 2011 was normal.  -Patient will be scheduled for  a screening colonoscopy with possible polypectomy to be done by Dr. Havery Moros (patient originally scheduled to see her in clinic.  The risks and benefits of the procedure were discussed and the patient agrees to proceed. Due to weight the colonoscopy will be done at the hospital.   History of pulmonary embolism. Patient will need to be off Xarelto for endoscopic procedures. Will communicate this to prescribing MD to make sure holding Xarelto is appropriate in this patient.   Loose stool, several week history. C-diff at surgeon's office was negative. Despite loose stool she frequently feels constipated. Suspect patient has IBS. Valuation time of EGD and colonoscopy.  Longstanding GERD, on daily PPI. Takes Tums and Maalox for breakthrough symptoms. Weight is likely contributing to persistent symptoms. Further evaluation at time of EGD  LUQ / nausea. She has CHRONIC epigastric burning but LUQ pain and nausea are new. No associated weight loss. For further evaluation patient will be scheduled for EGD to be done at time of colonoscopy.   Hx of GIST, s/p resection 2013  Morbid obesity, s/p gastric sleeve 2014 by Dr. Hassell Done  Cc:  Dickie La, MD

## 2016-02-12 NOTE — Progress Notes (Signed)
Agree with assessment and plan as outlined.  

## 2016-02-18 DIAGNOSIS — H52223 Regular astigmatism, bilateral: Secondary | ICD-10-CM | POA: Diagnosis not present

## 2016-02-18 DIAGNOSIS — H524 Presbyopia: Secondary | ICD-10-CM | POA: Diagnosis not present

## 2016-02-19 ENCOUNTER — Ambulatory Visit: Payer: Self-pay | Admitting: Family Medicine

## 2016-02-19 ENCOUNTER — Ambulatory Visit (INDEPENDENT_AMBULATORY_CARE_PROVIDER_SITE_OTHER): Payer: Medicare Other | Admitting: Family Medicine

## 2016-02-19 ENCOUNTER — Encounter: Payer: Self-pay | Admitting: Family Medicine

## 2016-02-19 DIAGNOSIS — Z7901 Long term (current) use of anticoagulants: Secondary | ICD-10-CM

## 2016-02-19 DIAGNOSIS — M1712 Unilateral primary osteoarthritis, left knee: Secondary | ICD-10-CM

## 2016-02-19 DIAGNOSIS — Z23 Encounter for immunization: Secondary | ICD-10-CM

## 2016-02-19 MED ORDER — MUPIROCIN 2 % EX OINT: TOPICAL_OINTMENT | CUTANEOUS | 0 refills | Status: DC

## 2016-02-19 NOTE — Assessment & Plan Note (Signed)
I gave her copy of letter sent to the gastroenterologist about stopping her anticoagulation briefly.

## 2016-02-19 NOTE — Progress Notes (Signed)
    CHIEF COMPLAINT / HPI: Chronic pain management: Things are about the same. She's not having any problem with her medicines. She takes her medicines appropriately she's able to get through her activities of the day pretty well. Without that she's not able to work at all because of knee and ankle pain. #2. Having increased problems with stomach pains. They have her scheduled for an EGD and a colonoscopy on the same day. She saw has some questions about how to cause in her anticoagulation. REVIEW OF SYSTEMS:  No fever, no episodes of blood in urine or gum bleeding. No unusual bruising.  OBJECTIVE:  Vital signs are reviewed.  Vital signs reviewed. GENERAL: Well-developed, well-nourished, no acute distress. CARDIOVASCULAR: Regular rate and rhythm no murmur gallop or rub LUNGS: Clear to auscultation bilaterally, no rales or wheeze. ABDOMEN: Soft positive bowel sounds NEURO: No gross focal neurological deficits. MSK: Movement of extremity x 4.    ASSESSMENT / PLAN: Please see problem oriented charting for details

## 2016-02-19 NOTE — Patient Instructions (Signed)
For the rash use the antibiotic cream / ointment twice daily for next few weeks. If it is not going away, call and leave me that message and we can do a course of oral antibiotics.  Once it clears up you can use the cream as needed.

## 2016-02-19 NOTE — Assessment & Plan Note (Signed)
Continue her current medications, prescriptions given.

## 2016-02-27 ENCOUNTER — Encounter (HOSPITAL_COMMUNITY): Payer: Self-pay | Admitting: *Deleted

## 2016-03-03 ENCOUNTER — Encounter (HOSPITAL_COMMUNITY): Payer: Self-pay

## 2016-03-03 ENCOUNTER — Encounter (HOSPITAL_COMMUNITY)
Admission: RE | Disposition: A | Discharge status: Home / Self Care | Payer: Self-pay | Source: Ambulatory Visit | Attending: Gastroenterology

## 2016-03-03 ENCOUNTER — Ambulatory Visit (HOSPITAL_COMMUNITY)
Admission: RE | Admit: 2016-03-03 | Discharge: 2016-03-03 | Disposition: A | Discharge status: Home / Self Care | Payer: Medicare Other | Source: Ambulatory Visit | Attending: Gastroenterology | Admitting: Gastroenterology

## 2016-03-03 ENCOUNTER — Ambulatory Visit (HOSPITAL_COMMUNITY): Payer: Medicare Other | Admitting: Anesthesiology

## 2016-03-03 ENCOUNTER — Ambulatory Visit: Payer: Self-pay | Admitting: Gastroenterology

## 2016-03-03 DIAGNOSIS — Z8249 Family history of ischemic heart disease and other diseases of the circulatory system: Secondary | ICD-10-CM | POA: Diagnosis not present

## 2016-03-03 DIAGNOSIS — K219 Gastro-esophageal reflux disease without esophagitis: Secondary | ICD-10-CM | POA: Diagnosis not present

## 2016-03-03 DIAGNOSIS — Z1211 Encounter for screening for malignant neoplasm of colon: Secondary | ICD-10-CM | POA: Diagnosis not present

## 2016-03-03 DIAGNOSIS — G8929 Other chronic pain: Secondary | ICD-10-CM | POA: Insufficient documentation

## 2016-03-03 DIAGNOSIS — Z86711 Personal history of pulmonary embolism: Secondary | ICD-10-CM | POA: Diagnosis not present

## 2016-03-03 DIAGNOSIS — Z800 Family history of malignant neoplasm of digestive organs: Secondary | ICD-10-CM | POA: Diagnosis not present

## 2016-03-03 DIAGNOSIS — Z86718 Personal history of other venous thrombosis and embolism: Secondary | ICD-10-CM | POA: Insufficient documentation

## 2016-03-03 DIAGNOSIS — Z90710 Acquired absence of both cervix and uterus: Secondary | ICD-10-CM | POA: Diagnosis not present

## 2016-03-03 DIAGNOSIS — F419 Anxiety disorder, unspecified: Secondary | ICD-10-CM | POA: Diagnosis not present

## 2016-03-03 DIAGNOSIS — Z9884 Bariatric surgery status: Secondary | ICD-10-CM | POA: Diagnosis not present

## 2016-03-03 DIAGNOSIS — M1712 Unilateral primary osteoarthritis, left knee: Secondary | ICD-10-CM | POA: Insufficient documentation

## 2016-03-03 DIAGNOSIS — K209 Esophagitis, unspecified without bleeding: Secondary | ICD-10-CM

## 2016-03-03 DIAGNOSIS — K2960 Other gastritis without bleeding: Secondary | ICD-10-CM | POA: Insufficient documentation

## 2016-03-03 DIAGNOSIS — Z1212 Encounter for screening for malignant neoplasm of rectum: Secondary | ICD-10-CM | POA: Diagnosis not present

## 2016-03-03 DIAGNOSIS — K210 Gastro-esophageal reflux disease with esophagitis: Secondary | ICD-10-CM | POA: Insufficient documentation

## 2016-03-03 DIAGNOSIS — K648 Other hemorrhoids: Secondary | ICD-10-CM | POA: Diagnosis not present

## 2016-03-03 DIAGNOSIS — I10 Essential (primary) hypertension: Secondary | ICD-10-CM | POA: Insufficient documentation

## 2016-03-03 DIAGNOSIS — M25569 Pain in unspecified knee: Secondary | ICD-10-CM | POA: Insufficient documentation

## 2016-03-03 DIAGNOSIS — Z8379 Family history of other diseases of the digestive system: Secondary | ICD-10-CM | POA: Diagnosis not present

## 2016-03-03 DIAGNOSIS — Z833 Family history of diabetes mellitus: Secondary | ICD-10-CM | POA: Insufficient documentation

## 2016-03-03 DIAGNOSIS — F329 Major depressive disorder, single episode, unspecified: Secondary | ICD-10-CM | POA: Insufficient documentation

## 2016-03-03 DIAGNOSIS — G43909 Migraine, unspecified, not intractable, without status migrainosus: Secondary | ICD-10-CM | POA: Insufficient documentation

## 2016-03-03 DIAGNOSIS — K50911 Crohn's disease, unspecified, with rectal bleeding: Secondary | ICD-10-CM | POA: Insufficient documentation

## 2016-03-03 DIAGNOSIS — K3189 Other diseases of stomach and duodenum: Secondary | ICD-10-CM | POA: Diagnosis not present

## 2016-03-03 DIAGNOSIS — R194 Change in bowel habit: Secondary | ICD-10-CM

## 2016-03-03 DIAGNOSIS — Z6841 Body Mass Index (BMI) 40.0 and over, adult: Secondary | ICD-10-CM | POA: Insufficient documentation

## 2016-03-03 DIAGNOSIS — Z8500 Personal history of malignant neoplasm of unspecified digestive organ: Secondary | ICD-10-CM | POA: Diagnosis not present

## 2016-03-03 DIAGNOSIS — K449 Diaphragmatic hernia without obstruction or gangrene: Secondary | ICD-10-CM | POA: Diagnosis not present

## 2016-03-03 DIAGNOSIS — Z8371 Family history of colonic polyps: Secondary | ICD-10-CM | POA: Diagnosis not present

## 2016-03-03 DIAGNOSIS — R110 Nausea: Secondary | ICD-10-CM

## 2016-03-03 DIAGNOSIS — Z885 Allergy status to narcotic agent status: Secondary | ICD-10-CM | POA: Insufficient documentation

## 2016-03-03 HISTORY — PX: COLONOSCOPY WITH PROPOFOL: SHX5780

## 2016-03-03 HISTORY — PX: ESOPHAGOGASTRODUODENOSCOPY (EGD) WITH PROPOFOL: SHX5813

## 2016-03-03 SURGERY — ESOPHAGOGASTRODUODENOSCOPY (EGD) WITH PROPOFOL
Anesthesia: Monitor Anesthesia Care

## 2016-03-03 MED ORDER — LACTATED RINGERS IV SOLN
INTRAVENOUS | Status: DC
  Administered 2016-03-03: 09:00:00 via INTRAVENOUS

## 2016-03-03 MED ORDER — LIDOCAINE 2% (20 MG/ML) 5 ML SYRINGE
INTRAMUSCULAR | Status: DC | PRN
  Administered 2016-03-03: 60 mg via INTRAVENOUS

## 2016-03-03 MED ORDER — DEXLANSOPRAZOLE 60 MG PO CPDR: 60.0000 mg | DELAYED_RELEASE_CAPSULE | Freq: Every day | ORAL | 1 refills | Status: DC

## 2016-03-03 MED ORDER — PROPOFOL 10 MG/ML IV BOLUS
INTRAVENOUS | Status: AC
  Filled 2016-03-03: qty 20

## 2016-03-03 MED ORDER — PROPOFOL 500 MG/50ML IV EMUL
INTRAVENOUS | Status: DC | PRN
  Administered 2016-03-03: 145 ug/kg/min via INTRAVENOUS

## 2016-03-03 MED ORDER — ONDANSETRON HCL 4 MG/2ML IJ SOLN
INTRAMUSCULAR | Status: DC | PRN
  Administered 2016-03-03: 4 mg via INTRAVENOUS

## 2016-03-03 MED ORDER — PROPOFOL 10 MG/ML IV BOLUS
INTRAVENOUS | Status: AC
  Filled 2016-03-03: qty 60

## 2016-03-03 MED ORDER — ONDANSETRON HCL 4 MG/2ML IJ SOLN
INTRAMUSCULAR | Status: AC
  Filled 2016-03-03: qty 2

## 2016-03-03 MED ORDER — PROPOFOL 10 MG/ML IV BOLUS
INTRAVENOUS | Status: DC | PRN
  Administered 2016-03-03 (×3): 20 mg via INTRAVENOUS

## 2016-03-03 MED ORDER — SODIUM CHLORIDE 0.9 % IV SOLN: INTRAVENOUS | Status: DC

## 2016-03-03 MED ORDER — LIDOCAINE 2% (20 MG/ML) 5 ML SYRINGE
INTRAMUSCULAR | Status: AC
  Filled 2016-03-03: qty 5

## 2016-03-03 SURGICAL SUPPLY — 24 items

## 2016-03-03 NOTE — H&P (View-Only) (Signed)
HPI: Patient is a 45 year old female referred by PCP Crystal Mcmurray, MD  for abdominal pain. Patient has MULTIPLE GI complaints. Patient had a GIST removed in 2013. In 2014 she underwent gastric sleeve placement. She saw surgery in May 2016 for epigastric pain, EGD by Surgeon to evaluate the sleeve was normal. Patient's father had CRC in his 10's.Crystal Garza He had UC or crohn's, patient is unsure.   Crystal Garza has chronic epigastric burning but over the last several weeks she has had postprandial nausea and LUQ pain described as contractions No vomiting. On a daily PPI for GERD, has added some Tums and Maalox without benefit.  She lost 93 pounds after gastric sleeve but has gained back 30 pounds.   Also over the last several weeks patient has been having loose stools. She can easily have loose stool depending on diet. C-diff ar surgery's office last month was negative. Last week patient had some blood in her stool but thinks it was from constipation.    Past Medical History:  Diagnosis Date  . Anemia    iron def  . Anginal pain ([place])   . Anxiety   . Arthritis    left knee   . Back pain   . Benign gastrointestinal stromal tumor (GIST)   . Chronic knee pain   . Depression   . DVT (deep venous thrombosis) ([place])   . GERD (gastroesophageal reflux disease)   . Hx of laparoscopic gastric banding   . Hypertension   . Kidney stones   . Migraine   . Morbid obesity ([place])   . PE (pulmonary embolism)     Past Surgical History:  Procedure Laterality Date  . ABDOMINAL HYSTERECTOMY    . BIOPSY  02/16/2012   Procedure: BIOPSY;  Surgeon: [name], MD;  Location: [facility];  Service: General;;  biopsy of mass x 2  . BREATH TEK H PYLORI  07/28/2011   Procedure: [place];  Surgeon: [name], MD;  Location: [facility];  Service: General;  Laterality: N/A;  to be done at 745  . CESAREAN SECTION  05/1991  . ESOPHAGOGASTRODUODENOSCOPY N/A 08/29/2012   Procedure:  ESOPHAGOGASTRODUODENOSCOPY (EGD);  Surgeon: [name], MD;  Location: [facility];  Service: General;  Laterality: N/A;  . ESOPHAGOGASTRODUODENOSCOPY N/A 09/04/2014   Procedure: ESOPHAGOGASTRODUODENOSCOPY (EGD);  Surgeon: Alphonsa Overall, MD;  Location: [facility];  Service: General;  Laterality: N/A;  . EUS  03/03/2012   Procedure: UPPER ENDOSCOPIC ULTRASOUND (EUS) LINEAR;  Surgeon: Milus Banister, MD;  Location: [facility];  Service: Endoscopy;  Laterality: N/A;  . gist    . KNEE ARTHROSCOPY    . LAPAROSCOPIC GASTRECTOMY  04/08/2012   Procedure: LAPAROSCOPIC GASTRECTOMY;  Surgeon: [name], MD;  Location: [facility];  Service: General;;  removal of GIST tumor of stomach  . LAPAROSCOPIC GASTRIC SLEEVE RESECTION N/A 08/29/2012   Procedure: LAPAROSCOPIC GASTRIC SLEEVE RESECTION;  Surgeon: [name], MD;  Location: [facility];  Service: General;  Laterality: N/A;  Sleeve Gastrectomy  . TOTAL KNEE ARTHROPLASTY Left 10/06/2013   Procedure: LEFT TOTAL KNEE ARTHROPLASTY;  Surgeon: Mcarthur Rossetti, MD;  Location: [facility];  Service: Orthopedics;  Laterality: Left;  . TUBAL LIGATION     Family History  Problem Relation Age of Onset  . Diabetes Mother   . Diabetes Father   . Heart disease Father   . Colon cancer Father     brain tumor  .  Colon polyps Father   . Colitis Father   . Irritable bowel syndrome Father   . Diabetes Maternal Grandmother   . Diabetes Maternal Grandfather   . Schizophrenia Maternal Grandfather   . Heart disease Paternal Uncle   . Diabetes Paternal Uncle   . Heart disease Paternal Grandmother   . Heart disease Paternal Grandfather   . Diabetes Maternal Aunt   . Diabetes Maternal Uncle   . Diabetes Paternal Aunt    Social History  Substance Use Topics  . Smoking status: Never Smoker  . Smokeless tobacco: Never Used  . Alcohol use 0.0 oz/week     Comment: maybe once a month   Current Outpatient Prescriptions  Medication Sig Dispense Refill  .  hydrOXYzine (VISTARIL) 25 MG capsule Take 1 capsule (25 mg total) by mouth at bedtime. 30 capsule 2  . lamoTRIgine (LAMICTAL) 200 MG tablet TAKE ONE TABLET AT BEDTIME. 30 tablet 0  . lisinopril (PRINIVIL,ZESTRIL) 10 MG tablet Take 1 tablet (10 mg total) by mouth daily. 90 tablet 3  . meclizine (ANTIVERT) 32 MG tablet Take 1 tablet (32 mg total) by mouth 3 (three) times daily as needed. 30 tablet 0  . omeprazole (PRILOSEC) 40 MG capsule Take 40 mg by mouth daily.    . ondansetron (ZOFRAN-ODT) 4 MG disintegrating tablet Take 4 mg by mouth 1 day or 1 dose.    . oxyCODONE-acetaminophen (PERCOCET) 7.5-325 MG tablet Take one tablet by mouth every 8 hours as needed for pain do not fill before April 07 2016 90 tablet 0  . pantoprazole (PROTONIX) 40 MG tablet Take 1 tablet (40 mg total) by mouth 2 (two) times daily. 180 tablet 3  . risperiDONE (RISPERDAL) 1 MG tablet Take 1 tablet (1 mg total) by mouth at bedtime. 30 tablet 12  . rivaroxaban (XARELTO) 20 MG TABS tablet Take 1 tablet (20 mg total) by mouth daily with supper. 90 tablet 3  . tiZANidine (ZANAFLEX) 4 MG tablet Take 1 tablet (4 mg total) by mouth 2 (two) times daily as needed for muscle spasms. 60 tablet 12  . VESICARE 5 MG tablet Take 5 mg by mouth daily.    . Na Sulfate-K Sulfate-Mg Sulf 17.5-3.13-1.6 GM/180ML SOLN Take 1 kit by mouth once. 354 mL 0   No current facility-administered medications for this visit.    Allergies  Allergen Reactions  . Codeine Rash     Review of Systems: Positive for anxiety, arthritis, back pain, depression and headaches. All other systems reviewed and negative except where noted in HPI.   Physical Exam: BP 118/78   Pulse 72   Ht '5\' 7"'  (1.702 m)   Wt (!) 340 lb 8 oz (154.4 kg)   BMI 53.33 kg/m  Constitutional: obese, black female in NAD.  HEENT: Normocephalic and atraumatic. Conjunctivae are normal. No scleral icterus. Neck supple.  Cardiovascular: Normal rate, regular rhythm.  Pulmonary/chest:  Effort normal and breath sounds normal. No wheezing, rales or rhonchi. Abdominal: Soft,obese, nondistended, nontender. Bowel sounds active throughout. There are no masses palpable. No hepatomegaly. Extremities: no edema Lymphadenopathy: No cervical adenopathy noted. Neurological: Alert and oriented to person place and time. Skin: Skin is warm and dry. No rashes noted. Psychiatric:Pleasant but easily agitated.    ASSESSMENT AND PLAN:  45 year old female with severl week history of bowel changes with at least one episode of rectal bleeding. [facility] CRC in father in his 46s. Her last colonoscopy in 2011 was normal.  -Patient will be scheduled for  a screening colonoscopy with possible polypectomy to be done by Dr. Havery Moros (patient originally scheduled to see her in clinic.  The risks and benefits of the procedure were discussed and the patient agrees to proceed. Due to weight the colonoscopy will be done at the hospital.   History of pulmonary embolism. Patient will need to be off Xarelto for endoscopic procedures. Will communicate this to prescribing MD to make sure holding Xarelto is appropriate in this patient.   Loose stool, several week history. C-diff at surgeon's office was negative. Despite loose stool she frequently feels constipated. Suspect patient has IBS. Valuation time of EGD and colonoscopy.  Longstanding GERD, on daily PPI. Takes Tums and Maalox for breakthrough symptoms. Weight is likely contributing to persistent symptoms. Further evaluation at time of EGD  LUQ / nausea. She has CHRONIC epigastric burning but LUQ pain and nausea are new. No associated weight loss. For further evaluation patient will be scheduled for EGD to be done at time of colonoscopy.   Hx of GIST, s/p resection 2013  Morbid obesity, s/p gastric sleeve 2014 by Dr. Hassell Done  Cc:  Dickie La, MD

## 2016-03-03 NOTE — Interval H&P Note (Signed)
History and Physical Interval Note:  03/03/2016 9:32 AM  Crystal Garza  has presented today for surgery, with the diagnosis of Gerd, nausea, change in bowels, family hx of colon cancer  The various methods of treatment have been discussed with the patient and family. After consideration of risks, benefits and other options for treatment, the patient has consented to  Procedure(s): ESOPHAGOGASTRODUODENOSCOPY (EGD) WITH PROPOFOL (N/A) COLONOSCOPY WITH PROPOFOL (N/A) as a surgical intervention .  The patient's history has been reviewed, patient examined, no change in status, stable for surgery.  I have reviewed the patient's chart and labs.  Questions were answered to the patient's satisfaction.     [name]

## 2016-03-03 NOTE — Anesthesia Postprocedure Evaluation (Signed)
Anesthesia Post Note  Patient: Crystal Garza  Procedure(s) Performed: Procedure(s) (LRB): ESOPHAGOGASTRODUODENOSCOPY (EGD) WITH PROPOFOL (N/A) COLONOSCOPY WITH PROPOFOL (N/A)  Patient location during evaluation: Endoscopy Anesthesia Type: MAC Level of consciousness: awake and alert, oriented and patient cooperative Pain management: pain level controlled Vital Signs Assessment: post-procedure vital signs reviewed and stable Respiratory status: spontaneous breathing, nonlabored ventilation and respiratory function stable Cardiovascular status: blood pressure returned to baseline and stable Postop Assessment: no signs of nausea or vomiting Anesthetic complications: no    Last Vitals:  Vitals:   03/03/16 1045 03/03/16 1050  BP:    Pulse: 70 70  Resp: 18 14  Temp:      Last Pain:  Vitals:   03/03/16 1030  TempSrc: Oral  PainSc:                  Ali Mohl,E. Caedyn Raygoza

## 2016-03-03 NOTE — Transfer of Care (Signed)
Immediate Anesthesia Transfer of Care Note  Patient: Crystal Garza  Procedure(s) Performed: Procedure(s): ESOPHAGOGASTRODUODENOSCOPY (EGD) WITH PROPOFOL (N/A) COLONOSCOPY WITH PROPOFOL (N/A)  Patient Location: PACU and Endoscopy Unit  Anesthesia Type:MAC  Level of Consciousness: awake and patient cooperative  Airway & Oxygen Therapy: Patient Spontanous Breathing and Patient connected to nasal cannula oxygen  Post-op Assessment: Report given to RN and Post -op Vital signs reviewed and stable  Post vital signs: Reviewed and stable  Last Vitals:  Vitals:   03/03/16 0820  BP: 124/62  Pulse: 83  Resp: (!) 22  Temp: 36.6 C    Last Pain:  Vitals:   03/03/16 0938  TempSrc:   PainSc: 7          Complications: No apparent anesthesia complications

## 2016-03-03 NOTE — Op Note (Signed)
[facility] Patient Name: Crystal Garza Procedure Date: 03/03/2016 MRN: [id number] Attending MD: Carlota Raspberry. Adalyna Godbee MD, MD Date of Birth: 05/11/1970 CSN: [id number] Age: 45 Admit Type: Outpatient Procedure:                Colonoscopy Indications:              Screening in patient at increased risk: Family                            history of 1st-degree relative with colorectal                            cancer (father age 57s) Providers:                Carlota Raspberry. Shameria Trimarco MD, MD, Laverta Baltimore RN,                            RN, Elspeth Cho Tech., Technician, Dione Booze, CRNA Referring MD:              Medicines:                Monitored Anesthesia Care Complications:            No immediate complications. Estimated blood loss:                            None. Estimated Blood Loss:     Estimated blood loss: none. Procedure:                Pre-Anesthesia Assessment:                           - Prior to the procedure, a History and Physical                            was performed, and patient medications and                            allergies were reviewed. The patient's tolerance of                            previous anesthesia was also reviewed. The risks                            and benefits of the procedure and the sedation                            options and risks were discussed with the patient.                            All questions were answered, and informed consent                            was obtained. Prior Anticoagulants: The  patient has                            taken Xarelto (rivaroxaban), last dose was 2 days                            prior to procedure. ASA Grade Assessment: III - A                            patient with severe systemic disease. After                            reviewing the risks and benefits, the patient was                            deemed in satisfactory condition to  undergo the                            procedure.                           After obtaining informed consent, the colonoscope                            was passed under direct vision. Throughout the                            procedure, the patient's blood pressure, pulse, and                            oxygen saturations were monitored continuously. The                            [id number] scope was introduced through                            the anus and advanced to the the cecum, identified                            by appendiceal orifice and ileocecal valve. The                            colonoscopy was performed without difficulty. The                            patient tolerated the procedure well. The quality                            of the bowel preparation was good. The ileocecal                            valve, appendiceal orifice, and rectum were  photographed. Scope In: 10:04:24 AM Scope Out: 10:20:27 AM Scope Withdrawal Time: 0 hours 12 minutes 57 seconds  Total Procedure Duration: 0 hours 16 minutes 3 seconds  Findings:      The perianal and digital rectal examinations were normal.      Non-bleeding internal hemorrhoids were found during retroflexion. The       hemorrhoids were small.      The exam was otherwise without abnormality. No polyps. Impression:               - Non-bleeding internal hemorrhoids.                           - The examination was otherwise normal. No polyps Moderate Sedation:      No moderate sedation, case performed with MAC Recommendation:           - Patient has a contact number available for                            emergencies. The signs and symptoms of potential                            delayed complications were discussed with the                            patient. Return to normal activities tomorrow.                            Written discharge instructions were provided to the                             patient.                           - Resume previous diet.                           - Continue present medications.                           - Repeat colonoscopy in 5 years for screening                            purposes. Procedure Code(s):        --- Professional ---                           [phone], Colonoscopy, flexible; diagnostic, including                            collection of specimen(s) by brushing or washing,                            when performed (separate procedure) Diagnosis Code(s):        --- Professional ---                           Z80.0, Family history of malignant  neoplasm of                            digestive organs                           K64.8, Other hemorrhoids CPT copyright 2016 American Medical Association. All rights reserved. The codes documented in this report are preliminary and upon coder review may  be revised to meet current compliance requirements. Remo Lipps P. Montia Haslip MD, MD 03/03/2016 10:25:12 AM This report has been signed electronically. Number of Addenda: 0

## 2016-03-03 NOTE — Op Note (Signed)
[facility] Patient Name: Crystal Garza Procedure Date: 03/03/2016 MRN: [id number] Attending MD: Carlota Raspberry. Dametra Whetsel MD, MD Date of Birth: August 09, 1970 CSN: [id number] Age: 45 Admit Type: Outpatient Procedure:                Upper GI endoscopy Indications:              Epigastric abdominal pain, Nausea Providers:                Remo Lipps P. Maleke Feria MD, MD, Laverta Baltimore RN,                            RN, Elspeth Cho Tech., Technician, Dione Booze, CRNA Referring MD:              Medicines:                Monitored Anesthesia Care Complications:            No immediate complications. Estimated blood loss:                            Minimal. Estimated Blood Loss:     Estimated blood loss was minimal. Procedure:                Pre-Anesthesia Assessment:                           - Prior to the procedure, a History and Physical                            was performed, and patient medications and                            allergies were reviewed. The patient's tolerance of                            previous anesthesia was also reviewed. The risks                            and benefits of the procedure and the sedation                            options and risks were discussed with the patient.                            All questions were answered, and informed consent                            was obtained. Prior Anticoagulants: The patient has                            taken Xarelto (rivaroxaban), last dose was 2 days  prior to procedure. ASA Grade Assessment: III - A                            patient with severe systemic disease. After                            reviewing the risks and benefits, the patient was                            deemed in satisfactory condition to undergo the                            procedure.                           After obtaining informed consent, the endoscope was                             passed under direct vision. Throughout the                            procedure, the patient's blood pressure, pulse, and                            oxygen saturations were monitored continuously. The                            [id number] scope was introduced through the                            mouth, and advanced to the second part of duodenum.                            The upper GI endoscopy was accomplished without                            difficulty. The patient tolerated the procedure                            well. Scope In: Scope Out: Findings:      Esophagogastric landmarks were identified: the Z-line was found at 38       cm, the gastroesophageal junction was found at 38 cm and the upper       extent of the gastric folds was found at 40 cm from the incisors.      A 2 cm hiatal hernia was present.      LA Grade A (one or more mucosal breaks less than 5 mm, not extending       between tops of 2 mucosal folds) esophagitis was found. There was some       nodular tissue at the GEJ, I suspect due to inflammatory changes.       Biopsies were taken with a cold forceps for histology to ensure no       evidence of dysplasia.      The exam of the esophagus was otherwise  normal.      Evidence of a sleeve gastrectomy was found in the gastric body.      The exam of the stomach was otherwise normal.      Biopsies were taken with a cold forceps in the gastric body and in the       gastric antrum for Helicobacter pylori testing.      The duodenal bulb and second portion of the duodenum were normal. Impression:               - Esophagogastric landmarks identified.                           - 2 cm hiatal hernia.                           - LA Grade A reflux esophagitis with suspected                            inflammatory related nodularity. Biopsied.                           - A sleeve gastrectomy was found.                           - Normal  duodenal bulb and second portion of the                            duodenum.                           - Biopsies were taken with a cold forceps for                            Helicobacter pylori testing. Moderate Sedation:      No moderate sedation, case performed with MAC Recommendation:           - Patient has a contact number available for                            emergencies. The signs and symptoms of potential                            delayed complications were discussed with the                            patient. Return to normal activities tomorrow.                            Written discharge instructions were provided to the                            patient.                           - Resume previous diet.                           -  Continue present medications. Resumption of                            Xarelto per colonoscopy note.                           - Increase omeprazole to twice daily                           - Await pathology results. Procedure Code(s):        --- Professional ---                           [phone], Esophagogastroduodenoscopy, flexible,                            transoral; with biopsy, single or multiple Diagnosis Code(s):        --- Professional ---                           K44.9, Diaphragmatic hernia without obstruction or                            gangrene                           K21.0, Gastro-esophageal reflux disease with                            esophagitis                           Z98.84, Bariatric surgery status                           R10.13, Epigastric pain                           R11.0, Nausea CPT copyright 2016 American Medical Association. All rights reserved. The codes documented in this report are preliminary and upon coder review may  be revised to meet current compliance requirements. Remo Lipps P. Camaria Gerald MD, MD 03/03/2016 10:02:03 AM This report has been signed electronically. Number of Addenda: 0

## 2016-03-03 NOTE — Anesthesia Preprocedure Evaluation (Addendum)
Anesthesia Evaluation  Patient identified by MRN, date of birth, ID band Patient awake    Reviewed: Allergy & Precautions, NPO status , Patient's Chart, lab work & pertinent test results  History of Anesthesia Complications Negative for: history of anesthetic complications  Airway Mallampati: I  TM Distance: >3 FB Neck ROM: Full    Dental  (+) Edentulous Upper, Poor Dentition, Chipped, Dental Advisory Given   Pulmonary PE (post op TKR)   breath sounds clear to auscultation       Cardiovascular hypertension, Pt. on medications (-) angina+ DVT   Rhythm:Regular Rate:Normal     Neuro/Psych  Headaches, Anxiety Depression Bipolar Disorder    GI/Hepatic Neg liver ROS, GERD  Medicated and Poorly Controlled,H/o gastric band   Endo/Other  Morbid obesity  Renal/GU negative Renal ROS     Musculoskeletal  (+) Arthritis ,   Abdominal (+) + obese,   Peds  Hematology  (+) Blood dyscrasia (xarelto), ,   Anesthesia Other Findings   Reproductive/Obstetrics                            Anesthesia Physical Anesthesia Plan  ASA: III  Anesthesia Plan: MAC   Post-op Pain Management:    Induction: Intravenous  Airway Management Planned: Natural Airway  Additional Equipment:   Intra-op Plan:   Post-operative Plan:   Informed Consent: I have reviewed the patients History and Physical, chart, labs and discussed the procedure including the risks, benefits and alternatives for the proposed anesthesia with the patient or authorized representative who has indicated his/her understanding and acceptance.   Dental advisory given  Plan Discussed with: CRNA and Surgeon  Anesthesia Plan Comments: (Plan routine monitors, MAC)        Anesthesia Quick Evaluation

## 2016-03-03 NOTE — Discharge Instructions (Signed)
YOU HAD AN ENDOSCOPIC PROCEDURE TODAY: Refer to the procedure report and other information in the discharge instructions given to you for any specific questions about what was found during the examination. If this information does not answer your questions, please call [facility] office at [phone] to clarify.  ° °YOU SHOULD EXPECT: Some feelings of bloating in the abdomen. Passage of more gas than usual. Walking can help get rid of the air that was put into your GI tract during the procedure and reduce the bloating. If you had a lower endoscopy (such as a colonoscopy or flexible sigmoidoscopy) you may notice spotting of blood in your stool or on the toilet paper. Some abdominal soreness may be present for a day or two, also. ° °DIET: Your first meal following the procedure should be a light meal and then it is ok to progress to your normal diet. A half-sandwich or bowl of soup is an example of a good first meal. Heavy or fried foods are harder to digest and may make you feel nauseous or bloated. Drink plenty of fluids but you should avoid alcoholic beverages for 24 hours. If you had a esophageal dilation, please see attached instructions for diet.   ° °ACTIVITY: Your care partner should take you home directly after the procedure. You should plan to take it easy, moving slowly for the rest of the day. You can resume normal activity the day after the procedure however YOU SHOULD NOT DRIVE, use power tools, machinery or perform tasks that involve climbing or major physical exertion for 24 hours (because of the sedation medicines used during the test).  ° °SYMPTOMS TO REPORT IMMEDIATELY: °A gastroenterologist can be reached at any hour. Please call [phone]  for any of the following symptoms:  °Following lower endoscopy (colonoscopy, flexible sigmoidoscopy) °Excessive amounts of blood in the stool  °Significant tenderness, worsening of abdominal pains  °Swelling of the abdomen that is new, acute  °Fever of 100° or  higher  °Following upper endoscopy (EGD, EUS, ERCP, esophageal dilation) °Vomiting of blood or coffee ground material  °New, significant abdominal pain  °New, significant chest pain or pain under the shoulder blades  °Painful or persistently difficult swallowing  °New shortness of breath  °Black, tarry-looking or red, bloody stools ° °FOLLOW UP:  °If any biopsies were taken you will be contacted by phone or by letter within the next 1-3 weeks. Call [phone]  if you have not heard about the biopsies in 3 weeks.  °Please also call with any specific questions about appointments or follow up tests. ° °

## 2016-03-03 NOTE — Anesthesia Procedure Notes (Signed)
Procedure Name: MAC Date/Time: 03/03/2016 9:39 AM Performed by: Dione Booze Pre-anesthesia Checklist: Patient identified, Suction available, Emergency Drugs available and Patient being monitored Patient Re-evaluated:Patient Re-evaluated prior to inductionOxygen Delivery Method: Nasal cannula Placement Confirmation: positive ETCO2

## 2016-03-04 ENCOUNTER — Encounter: Payer: Self-pay | Admitting: Gastroenterology

## 2016-03-04 ENCOUNTER — Telehealth: Payer: Self-pay | Admitting: Family Medicine

## 2016-03-04 MED ORDER — CEPHALEXIN 500 MG PO TABS: 500.0000 mg | ORAL_TABLET | Freq: Three times a day (TID) | ORAL | 0 refills | Status: DC

## 2016-03-04 NOTE — Telephone Encounter (Signed)
Dear [name] Team Please let her know I am putting her on an oral medicine for rash. It should clear up by end of course in 2 weeks. If not, let me know I have sent rx in [facility]! Crystal Garza

## 2016-03-04 NOTE — Telephone Encounter (Signed)
Pt said cream for the rash is not working and Dr. Nori Riis told pt to call and let her know, if not they could try something else. Pt uses [facility]. Please advise. Thanks! ep

## 2016-03-04 NOTE — Telephone Encounter (Signed)
Tried to contact pt to inform her of below and phone only rang with no option to LVM, if she returns call please inform her of below. Katharina Caper, April D, [place]

## 2016-03-04 NOTE — Telephone Encounter (Signed)
Will forward to MD to advise. Cyntia Staley,CMA  

## 2016-03-05 NOTE — Progress Notes (Signed)
Letter mailed 03/05/16.

## 2016-03-10 ENCOUNTER — Telehealth: Payer: Self-pay | Admitting: Gastroenterology

## 2016-03-10 NOTE — Telephone Encounter (Signed)
Patient has not received the letter yet, read her the letter from Dr. Havery Moros with her results. Scheduled her for a follow up. She was asking if her gastric surgeon will get these results. She states he is in the [facility] system, assured her he would be able to see report and pathology.

## 2016-03-18 NOTE — Telephone Encounter (Signed)
Tried to contact pt again with no answer.  If pt calls back please be sure she has been informed of the below. Katharina Caper, Jonmarc Bodkin D, [place]

## 2016-04-02 ENCOUNTER — Ambulatory Visit (INDEPENDENT_AMBULATORY_CARE_PROVIDER_SITE_OTHER): Payer: Medicare Other | Admitting: Orthopaedic Surgery

## 2016-04-02 ENCOUNTER — Ambulatory Visit (INDEPENDENT_AMBULATORY_CARE_PROVIDER_SITE_OTHER): Payer: Medicare Other

## 2016-04-02 DIAGNOSIS — M542 Cervicalgia: Secondary | ICD-10-CM | POA: Diagnosis not present

## 2016-04-02 DIAGNOSIS — G5602 Carpal tunnel syndrome, left upper limb: Secondary | ICD-10-CM | POA: Diagnosis not present

## 2016-04-02 NOTE — Progress Notes (Signed)
Office Visit Note   Patient: Crystal Garza           Date of Birth: [birth year]           MRN: [id number] Visit Date: 04/02/2016              Requested by: Dickie La, MD [address] [place], [place] [address] PCP: [name], MD   Assessment & Plan: Visit Diagnoses:  1. Cervicalgia   2. Carpal tunnel syndrome, left upper limb     Plan: I did review her nerve conduction studies from last year of her left upper extremity was sure this with her. She does have moderate carpal tunnel syndrome and I do believe this is worsening this point. There is no other medicines are prescribed at this moment. I do feel she'll benefit from an open carpal tunnel release. The severity of the left side. I described in detail with the surgery involves. She's had this before her right side since she is well aware of what this involves. I would like to keep her out of work for at least 2 weeks after her surgery. I gave her our surgery scheduler's card and she will call us when she is decides to have this scheduled.  Follow-Up Instructions: Return if symptoms worsen or fail to improve.   Orders:  Orders Placed This Encounter  Procedures  . XR Cervical Spine 2 or 3 views   No orders of the defined types were placed in this encounter.     Procedures: No procedures performed   Clinical Data: No additional findings.   Subjective: Chief Complaint  Patient presents with  . Neck - Pain     C/o pain in the neck, left side shoulder and trigger.    HPI The patient comes in today was a weeks worth of neck pain and numbness and tingling in her left hand. Then numbness and tingling in her left hand is excellent chronic. She has known moderate carpal tunnel syndrome confirmed on nerve conduction studies done last year. She is actually on chronic pain medications including oxycodone and muscle relaxant and a blood thinner due to chronic blood clots. She works as a Psychologist, counselling. She has weak  grip strength she says on the left side. Review of Systems He currently denies any headache, chest pain, shortness breath, fever chills, nausea or vomiting  Objective: Vital Signs: There were no vitals taken for this visit.  Physical Exam She appears in no acute distress her obvious discomfort. She is alert and oriented 3. Ortho Exam [place] for cervical spine shows full flexion and extension, she has full rotation and lateral bending, there is no obvious deformities of her neck on exam. There is no midline tenderness. Examination of her left upper extremity shows a weak grip strength and a positive Phalen's and Tinel sign of the transverse carpal ligament Specialty Comments:  No specialty comments available.  Imaging: Xr Cervical Spine 2 Or 3 Views  Result Date: 04/02/2016 An AP and lateral of her cervical spine show normal disc space and normal alignment cervical spine with no acute irregularities or abnormalities    PMFS History: Patient Active Problem List   Diagnosis Date Noted  . Nausea without vomiting   . Family history of colon cancer   . Dizziness 11/08/2015  . 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 Chronic neck and back pain 02/27/2015  . Other chest pain 12/10/2014  . Thrombocytosis ([place]) 08/29/2014  . Medication management 08/22/2014  .  Hematuria 08/22/2014  . Pain in joint, ankle and foot 05/23/2014  . Chronic anticoagulation 05/17/2014  . Chronic thoracic back pain 04/11/2014  . Right flank pain 01/17/2014  . DVT, bilateral lower limbs ([place]) 10/08/2013  . Degenerative arthritis of left knee 10/06/2013  . Status post total knee replacement 10/06/2013  . Edema 07/28/2013  . Chronic pain 06/13/2013  . 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 Bipolar disorder ([place]) 01/19/2012  . Nephrolithiasis 02/20/2011  . Eczematous dermatitis 10/22/2010  . Iron deficiency anemia 06/27/2010  . Anxiety  06/27/2010  . Migraine 09/12/2009  . Morbid obesity-BMI 62 10/30/2008  . Osteoarthrosis, unspecified whether generalized or localized, involving lower leg 07/05/2007  . DEPRESSION, MAJOR, RECURRENT 07/01/2006  . HYPERTENSION, BENIGN SYSTEMIC 07/01/2006  . RHINITIS, ALLERGIC 07/01/2006  . Gastroesophageal reflux disease without esophagitis 07/01/2006   Past Medical History:  Diagnosis Date  . Anemia    iron def  . Anginal pain ([place])   . Anxiety   . Arthritis    left knee   . Back pain   . Benign gastrointestinal stromal tumor (GIST)   . Chronic knee pain   . Depression   . DVT (deep venous thrombosis) ([place])   . GERD (gastroesophageal reflux disease)   . Hx of laparoscopic gastric banding   . Hypertension   . Kidney stones   . Migraine   . Morbid obesity ([place])   . PE (pulmonary embolism)     Family History  Problem Relation Age of Onset  . Diabetes Mother   . Diabetes Father   . Heart disease Father   . Colon cancer Father     brain tumor  . Colon polyps Father   . Colitis Father   . Irritable bowel syndrome Father   . Diabetes Maternal Grandmother   . Diabetes Maternal Grandfather   . Schizophrenia Maternal Grandfather   . Heart disease Paternal Uncle   . Diabetes Paternal Uncle   . Heart disease Paternal Grandmother   . Heart disease Paternal Grandfather   . Diabetes Maternal Aunt   . Diabetes Maternal Uncle   . Diabetes Paternal Aunt     Past Surgical History:  Procedure Laterality Date  . ABDOMINAL HYSTERECTOMY    . BIOPSY  02/16/2012   Procedure: BIOPSY;  Surgeon: [name], MD;  Location: [facility];  Service: General;;  biopsy of mass x 2  . BREATH TEK H PYLORI  07/28/2011   Procedure: [place];  Surgeon: [name], MD;  Location: [facility];  Service: General;  Laterality: N/A;  to be done at 745  . CESAREAN SECTION  05/1991  . COLONOSCOPY WITH PROPOFOL N/A 03/03/2016   Procedure: COLONOSCOPY WITH PROPOFOL;  Surgeon: Manus Gunning, MD;  Location: [facility];  Service: Gastroenterology;  Laterality: N/A;  . ESOPHAGOGASTRODUODENOSCOPY N/A 08/29/2012   Procedure: ESOPHAGOGASTRODUODENOSCOPY (EGD);  Surgeon: [name], MD;  Location: [facility];  Service: General;  Laterality: N/A;  . ESOPHAGOGASTRODUODENOSCOPY N/A 09/04/2014   Procedure: ESOPHAGOGASTRODUODENOSCOPY (EGD);  Surgeon: Alphonsa Overall, MD;  Location: [facility];  Service: General;  Laterality: N/A;  . ESOPHAGOGASTRODUODENOSCOPY (EGD) WITH PROPOFOL N/A 03/03/2016   Procedure: ESOPHAGOGASTRODUODENOSCOPY (EGD) WITH PROPOFOL;  Surgeon: Manus Gunning, MD;  Location: [facility];  Service: Gastroenterology;  Laterality: N/A;  . EUS  03/03/2012   Procedure: UPPER ENDOSCOPIC ULTRASOUND (EUS) LINEAR;  Surgeon: Milus Banister, MD;  Location: [facility];  Service: Endoscopy;  Laterality: N/A;  . gist    . KNEE ARTHROSCOPY    . LAPAROSCOPIC GASTRECTOMY  04/08/2012   Procedure: LAPAROSCOPIC GASTRECTOMY;  Surgeon: [name], MD;  Location: [facility];  Service: General;;  removal of GIST tumor of stomach  . LAPAROSCOPIC GASTRIC SLEEVE RESECTION N/A 08/29/2012   Procedure: LAPAROSCOPIC GASTRIC SLEEVE RESECTION;  Surgeon: [name], MD;  Location: [facility];  Service: General;  Laterality: N/A;  Sleeve Gastrectomy  . TOTAL KNEE ARTHROPLASTY Left 10/06/2013   Procedure: LEFT TOTAL KNEE ARTHROPLASTY;  Surgeon: Mcarthur Rossetti, MD;  Location: [facility];  Service: Orthopedics;  Laterality: Left;  . TUBAL LIGATION     Social History   Occupational History  . Not on file.   Social History Main Topics  . Smoking status: Never Smoker  . Smokeless tobacco: Never Used  . Alcohol use 0.0 oz/week     Comment: maybe once a month  . Drug use: No  . Sexual activity: Not on file

## 2016-04-11 ENCOUNTER — Emergency Department (HOSPITAL_COMMUNITY)
Admission: EM | Admit: 2016-04-11 | Discharge: 2016-04-11 | Disposition: A | Discharge status: Home / Self Care | Payer: Medicare Other | Attending: Emergency Medicine | Admitting: Emergency Medicine

## 2016-04-11 ENCOUNTER — Emergency Department (HOSPITAL_COMMUNITY): Payer: Medicare Other

## 2016-04-11 ENCOUNTER — Encounter (HOSPITAL_COMMUNITY): Payer: Self-pay | Admitting: Emergency Medicine

## 2016-04-11 DIAGNOSIS — R0789 Other chest pain: Secondary | ICD-10-CM | POA: Diagnosis not present

## 2016-04-11 DIAGNOSIS — Z96652 Presence of left artificial knee joint: Secondary | ICD-10-CM | POA: Insufficient documentation

## 2016-04-11 DIAGNOSIS — R071 Chest pain on breathing: Secondary | ICD-10-CM | POA: Insufficient documentation

## 2016-04-11 DIAGNOSIS — I10 Essential (primary) hypertension: Secondary | ICD-10-CM | POA: Diagnosis not present

## 2016-04-11 DIAGNOSIS — Z7901 Long term (current) use of anticoagulants: Secondary | ICD-10-CM | POA: Diagnosis not present

## 2016-04-11 DIAGNOSIS — R079 Chest pain, unspecified: Secondary | ICD-10-CM | POA: Diagnosis not present

## 2016-04-11 DIAGNOSIS — R202 Paresthesia of skin: Secondary | ICD-10-CM | POA: Diagnosis not present

## 2016-04-11 DIAGNOSIS — Z79899 Other long term (current) drug therapy: Secondary | ICD-10-CM | POA: Diagnosis not present

## 2016-04-11 LAB — COMPREHENSIVE METABOLIC PANEL
ALT: 11 U/L — ABNORMAL LOW (ref 14–54)
AST: 16 U/L (ref 15–41)
Albumin: 3.8 g/dL (ref 3.5–5.0)
Alkaline Phosphatase: 57 U/L (ref 38–126)
Anion gap: 8 (ref 5–15)
BUN: 11 mg/dL (ref 6–20)
CO2: 26 mmol/L (ref 22–32)
Calcium: 9.1 mg/dL (ref 8.9–10.3)
Chloride: 105 mmol/L (ref 101–111)
Creatinine, Ser: 0.64 mg/dL (ref 0.44–1.00)
GFR calc Af Amer: >60 mL/min (ref >60)
GFR calc non Af Amer: >60 mL/min (ref >60)
Glucose, Bld: 102 mg/dL — ABNORMAL HIGH (ref 65–99)
Potassium: 4 mmol/L (ref 3.5–5.1)
Sodium: 139 mmol/L (ref 135–145)
Total Bilirubin: 0.2 mg/dL — ABNORMAL LOW (ref 0.3–1.2)
Total Protein: 8 g/dL (ref 6.5–8.1)

## 2016-04-11 LAB — CBC WITH DIFFERENTIAL/PLATELET
Basophils Absolute: 0 10*3/uL (ref 0.0–0.1)
Basophils Relative: 0 %
Eosinophils Absolute: 0.1 10*3/uL (ref 0.0–0.7)
Eosinophils Relative: 1 %
HCT: 40.7 % (ref 36.0–46.0)
Hemoglobin: 13 g/dL (ref 12.0–15.0)
Lymphocytes Relative: 30 %
Lymphs Abs: 3.2 10*3/uL (ref 0.7–4.0)
MCH: 26.6 pg (ref 26.0–34.0)
MCHC: 31.9 g/dL (ref 30.0–36.0)
MCV: 83.2 fL (ref 78.0–100.0)
Monocytes Absolute: 0.6 10*3/uL (ref 0.1–1.0)
Monocytes Relative: 6 %
Neutro Abs: 6.8 10*3/uL (ref 1.7–7.7)
Neutrophils Relative %: 63 %
Platelets: 465 10*3/uL — ABNORMAL HIGH (ref 150–400)
RBC: 4.89 MIL/uL (ref 3.87–5.11)
RDW: 14.6 % (ref 11.5–15.5)
WBC: 10.8 10*3/uL — ABNORMAL HIGH (ref 4.0–10.5)

## 2016-04-11 LAB — I-STAT TROPONIN, ED: Troponin i, poc: 0 ng/mL (ref 0.00–0.08)

## 2016-04-11 LAB — D-DIMER, QUANTITATIVE: D-Dimer, Quant: 0.69 ug/mL-FEU — ABNORMAL HIGH (ref 0.00–0.50)

## 2016-04-11 MED ORDER — ONDANSETRON HCL 4 MG/2ML IJ SOLN
4.0000 mg | Freq: Once | INTRAMUSCULAR | Status: AC
  Administered 2016-04-11: 4 mg via INTRAVENOUS
  Filled 2016-04-11: qty 2

## 2016-04-11 MED ORDER — SODIUM CHLORIDE 0.9 % IJ SOLN
INTRAMUSCULAR | Status: AC
  Filled 2016-04-11: qty 50

## 2016-04-11 MED ORDER — IOPAMIDOL (ISOVUE-370) INJECTION 76%
INTRAVENOUS | Status: AC
  Filled 2016-04-11: qty 100

## 2016-04-11 MED ORDER — HYDROMORPHONE HCL 1 MG/ML IJ SOLN
1.0000 mg | Freq: Once | INTRAMUSCULAR | Status: AC
  Administered 2016-04-11: 1 mg via INTRAVENOUS

## 2016-04-11 MED ORDER — HYDROMORPHONE HCL 1 MG/ML IJ SOLN
1.0000 mg | Freq: Once | INTRAMUSCULAR | Status: DC
  Filled 2016-04-11: qty 1

## 2016-04-11 MED ORDER — IOPAMIDOL (ISOVUE-370) INJECTION 76%
100.0000 mL | Freq: Once | INTRAVENOUS | Status: AC | PRN
  Administered 2016-04-11: 100 mL via INTRAVENOUS

## 2016-04-11 NOTE — ED Notes (Signed)
Provider in room  

## 2016-04-11 NOTE — Discharge Instructions (Signed)
Continue your medications for pain and muscle relaxants. Avoid heavy lifting or strenuous activity. Try stretching. Follow up with your doctor. Wear a wrist splint on left hand to help with carpal tunnel.

## 2016-04-11 NOTE — ED Triage Notes (Signed)
Patient complaining of right chest pain. She says she is having pain in her back and tingling in both hands. Patient was just dx with carpal tunnel.

## 2016-04-11 NOTE — ED Provider Notes (Signed)
[facility] DEPT Provider Note   CSN: [id number] Arrival date & time: 04/11/16  [id number]     History   Chief Complaint Chief Complaint  Patient presents with  . Chest Pain    HPI Crystal Garza is a 45 y.o. female.  HPI Crystal Garza is a 45 y.o. 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 Patient states pain started yesterday. She reports that pain has been there constantly since yesterday and gradually worsening. Patient states the pain is sharp, it radiates she feels like from the mid back into her central chest. Pain is worse with deep breathing. Denies any cough. Denies any fever or chills. Denies any injuries. She is also complaining of pain to bilateral neck and upper back, and tingling to the left hand which she states is not new. She saw orthopedic specialist several weeks ago for the same and was diagnosed with cervicalgia and carpal tunnel. Patient does have chronic pain and takes Percocet for her pain at home. She states she did take a dose of her Percocet yesterday but did not take it this morning. She states that pain was too severe this morning so she came to the ED.   Past Medical History:  Diagnosis Date  . Anemia    iron def  . Anginal pain ([place])   . Anxiety   . Arthritis    left knee   . Back pain   . Benign gastrointestinal stromal tumor (GIST)   . Chronic knee pain   . Depression   . DVT (deep venous thrombosis) ([place])   . GERD (gastroesophageal reflux disease)   . Hx of laparoscopic gastric banding   . Hypertension   . Kidney stones   . Migraine   . Morbid obesity ([place])   . PE (pulmonary embolism)     Patient Active Problem List   Diagnosis Date Noted  . Nausea without vomiting   . Family history of colon cancer   . Dizziness 11/08/2015  . 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  Chronic neck and back pain 02/27/2015  . Other chest pain 12/10/2014  . Thrombocytosis ([place]) 08/29/2014  . Medication management 08/22/2014  . Hematuria 08/22/2014  . Pain in joint, ankle and foot 05/23/2014  . Chronic anticoagulation 05/17/2014  . Chronic thoracic back pain 04/11/2014  . Right flank pain 01/17/2014  . DVT, bilateral lower limbs ([place]) 10/08/2013  . Degenerative arthritis of left knee 10/06/2013  . Status post total knee replacement 10/06/2013  . Edema 07/28/2013  . Chronic pain 06/13/2013  . 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 Bipolar disorder ([place]) 01/19/2012  . Nephrolithiasis 02/20/2011  . Eczematous dermatitis 10/22/2010  . Iron deficiency anemia 06/27/2010  . Anxiety 06/27/2010  . Migraine 09/12/2009  . Morbid obesity-BMI 62 10/30/2008  . Osteoarthrosis, unspecified whether generalized or localized, involving lower leg 07/05/2007  . DEPRESSION, MAJOR, RECURRENT 07/01/2006  . HYPERTENSION, BENIGN SYSTEMIC 07/01/2006  . RHINITIS, ALLERGIC 07/01/2006  . Gastroesophageal reflux disease without esophagitis 07/01/2006    Past Surgical History:  Procedure Laterality Date  . ABDOMINAL HYSTERECTOMY    . BIOPSY  02/16/2012   Procedure: BIOPSY;  Surgeon: [name], MD;  Location: [facility];  Service: General;;  biopsy of mass x 2  . [place]  07/28/2011   Procedure: BREATH TEK H PYLORI;  Surgeon: [name], MD;  Location: [facility];  Service: General;  Laterality: N/A;  to be done at 745  . CESAREAN SECTION  05/1991  . COLONOSCOPY WITH PROPOFOL N/A 03/03/2016   Procedure: COLONOSCOPY WITH PROPOFOL;  Surgeon: Manus Gunning, MD;  Location: [facility];  Service: Gastroenterology;  Laterality: N/A;  . ESOPHAGOGASTRODUODENOSCOPY N/A 08/29/2012   Procedure: ESOPHAGOGASTRODUODENOSCOPY (EGD);  Surgeon: [name], MD;  Location: [facility];  Service: General;   Laterality: N/A;  . ESOPHAGOGASTRODUODENOSCOPY N/A 09/04/2014   Procedure: ESOPHAGOGASTRODUODENOSCOPY (EGD);  Surgeon: Alphonsa Overall, MD;  Location: [facility];  Service: General;  Laterality: N/A;  . ESOPHAGOGASTRODUODENOSCOPY (EGD) WITH PROPOFOL N/A 03/03/2016   Procedure: ESOPHAGOGASTRODUODENOSCOPY (EGD) WITH PROPOFOL;  Surgeon: Manus Gunning, MD;  Location: [facility];  Service: Gastroenterology;  Laterality: N/A;  . EUS  03/03/2012   Procedure: UPPER ENDOSCOPIC ULTRASOUND (EUS) LINEAR;  Surgeon: Milus Banister, MD;  Location: [facility];  Service: Endoscopy;  Laterality: N/A;  . gist    . KNEE ARTHROSCOPY    . LAPAROSCOPIC GASTRECTOMY  04/08/2012   Procedure: LAPAROSCOPIC GASTRECTOMY;  Surgeon: [name], MD;  Location: [facility];  Service: General;;  removal of GIST tumor of stomach  . LAPAROSCOPIC GASTRIC SLEEVE RESECTION N/A 08/29/2012   Procedure: LAPAROSCOPIC GASTRIC SLEEVE RESECTION;  Surgeon: [name], MD;  Location: [facility];  Service: General;  Laterality: N/A;  Sleeve Gastrectomy  . TOTAL KNEE ARTHROPLASTY Left 10/06/2013   Procedure: LEFT TOTAL KNEE ARTHROPLASTY;  Surgeon: Mcarthur Rossetti, MD;  Location: [facility];  Service: Orthopedics;  Laterality: Left;  . TUBAL LIGATION      OB History    No data available       Home Medications    Prior to Admission medications   Medication Sig Start Date End Date Taking? Authorizing Provider  Cephalexin 500 MG tablet Take 1 tablet (500 mg total) by mouth 3 (three) times daily. 03/04/16   Dickie La, MD  dexlansoprazole (DEXILANT) 60 MG capsule Take 1 capsule (60 mg total) by mouth daily. 03/03/16 06/01/16  Manus Gunning, MD  hydrOXYzine (VISTARIL) 25 MG capsule Take 1 capsule (25 mg total) by mouth at bedtime. 08/07/15   Kathlee Nations, MD  lamoTRIgine (LAMICTAL) 200 MG tablet TAKE ONE TABLET AT BEDTIME. 01/20/16   Kathlee Nations, MD  lisinopril (PRINIVIL,ZESTRIL) 10 MG tablet Take 1 tablet (10 mg total)  by mouth daily. Patient taking differently: Take 10 mg by mouth daily.  08/28/15   Dickie La, MD  meclizine (ANTIVERT) 32 MG tablet Take 1 tablet (32 mg total) by mouth 3 (three) times daily as needed. 11/08/15   Nicolette Bang, DO  mupirocin ointment Drue Stager) 2 % Apply t o affected skin area twice daily as directed 02/19/16   Dickie La, MD  ondansetron (ZOFRAN-ODT) 4 MG disintegrating tablet Take 4 mg by mouth 1 day or 1 dose. 01/23/16   Historical Provider, MD  oxyCODONE-acetaminophen (PERCOCET) 7.5-325 MG tablet Take one tablet by mouth every 8 hours as needed for pain do not fill before April 07 2016 02/06/16   Dickie La, MD  risperiDONE (RISPERDAL) 1 MG tablet Take 1 tablet (1 mg total) by mouth at bedtime. 08/07/15   Kathlee Nations, MD  rivaroxaban (XARELTO) 20 MG TABS tablet Take 1 tablet (20 mg total) by mouth daily with supper. 08/28/15   Dickie La, MD  tiZANidine (ZANAFLEX) 4 MG tablet Take 1 tablet (4 mg total) by mouth 2 (two) times daily as needed for muscle spasms. 10/23/15   Dickie La, MD  VESICARE 5 MG tablet Take 5 mg by mouth daily.  10/03/14   Historical Provider, MD    Family History Family History  Problem Relation Age of Onset  . Diabetes Mother   . Diabetes Father   . Heart disease Father   . Colon cancer Father     brain tumor  . Colon polyps Father   . Colitis Father   . Irritable bowel syndrome Father   . Diabetes Maternal Grandmother   . Diabetes Maternal Grandfather   . Schizophrenia Maternal Grandfather   . Heart disease Paternal Uncle   . Diabetes Paternal Uncle   . Heart disease Paternal Grandmother   . Heart disease Paternal Grandfather   . Diabetes Maternal Aunt   . Diabetes Maternal Uncle   . Diabetes Paternal Aunt     Social History Social History  Substance Use Topics  . Smoking status: Never Smoker  . Smokeless tobacco: Never Used  . Alcohol use 0.0 oz/week     Comment: maybe once a month     Allergies    Codeine   Review of Systems Review of Systems  Constitutional: Negative for chills and fever.  Respiratory: Negative for cough, chest tightness and shortness of breath.   Cardiovascular: Positive for chest pain. Negative for palpitations and leg swelling.  Gastrointestinal: Negative for abdominal pain, diarrhea, nausea and vomiting.  Genitourinary: Negative for dysuria, flank pain and pelvic pain.  Musculoskeletal: Positive for arthralgias and back pain. Negative for myalgias, neck pain and neck stiffness.  Skin: Negative for rash.  Neurological: Positive for numbness. Negative for dizziness, weakness and headaches.  All other systems reviewed and are negative.    Physical Exam Updated Vital Signs BP 132/84 (BP Location: Left Wrist)   Pulse 82   Temp 98.3 F (36.8 C) (Oral)   Resp 20   Ht 5\' 7"  (1.702 m)   Wt (!) 154.2 kg   SpO2 95%   BMI 53.25 kg/m   Physical Exam  Constitutional: She appears well-developed and well-nourished. No distress.  Morbidly obese  HENT:  Head: Normocephalic.  Eyes: Conjunctivae are normal.  Neck: Normal range of motion. Neck supple.  Cardiovascular: Normal rate, regular rhythm and normal heart sounds.   Pulmonary/Chest: Effort normal and breath sounds normal. No respiratory distress. She has no wheezes. She has no rales. She exhibits tenderness.  Diffuse tenderness to the upper chest  Abdominal: Soft. Bowel sounds are normal. She exhibits no distension. There is no tenderness. There is no rebound.  Musculoskeletal: She exhibits no edema.  Diffuse tenderness over midline cervical spine, bilateral paraspinal muscles, bilateral trapezius muscles  Neurological: She is alert.  Skin: Skin is warm and dry.  Psychiatric: She has a normal mood and affect. Her behavior is normal.  Nursing note and vitals reviewed.    ED Treatments / Results  Labs (all labs ordered are listed, but only abnormal results are displayed) Labs Reviewed  COMPREHENSIVE  METABOLIC PANEL - Abnormal; Notable for the following:       Result Value   Glucose, Bld 102 (*)    ALT 11 (*)    Total Bilirubin 0.2 (*)    All other components within normal limits  CBC WITH DIFFERENTIAL/PLATELET - Abnormal; Notable for the following:    WBC 10.8 (*)    Platelets 465 (*)  All other components within normal limits  D-DIMER, QUANTITATIVE (NOT AT [facility]) - Abnormal; Notable for the following:    D-Dimer, Quant 0.69 (*)    All other components within normal limits  I-STAT TROPOININ, ED    EKG  EKG Interpretation  Date/Time:  Saturday April 11 2016 06:26:44 EST Ventricular Rate:  82 PR Interval:    QRS Duration: 77 QT Interval:  342 QTC Calculation: 400 R Axis:   40 Text Interpretation:  Sinus rhythm Low voltage, precordial leads No significant change since last tracing Confirmed by WARD,  DO, KRISTEN [phone]) on 04/11/2016 6:30:53 AM       Radiology Dg Chest 2 View  Result Date: 04/11/2016 CLINICAL DATA:  Right-sided chest pain and dyspnea, onset last night. EXAM: CHEST  2 VIEW COMPARISON:  12/10/2014 FINDINGS: The heart size and mediastinal contours are within normal limits. Both lungs are clear. The visualized skeletal structures are unremarkable. IMPRESSION: No active cardiopulmonary disease. Electronically Signed   By: Andreas Newport M.D.   On: 04/11/2016 06:48    Procedures Procedures (including critical care time)  Medications Ordered in ED Medications - No data to display   Initial Impression / Assessment and Plan / ED Course  I have reviewed the triage vital signs and the nursing notes.  Pertinent labs & imaging results that were available during my care of the patient were reviewed by me and considered in my medical decision making (see chart for details).  Clinical Course    7:01 AM Pt seen and examined. Pt with constant, sharp chest pain since yesterday. VS normal. Pt in NAD. Requesting pain medications. Given hx of PE, will get d  dimer, will get basic labs, ecg, trop, CXR.   8:12 AM CXR, labs unremarkable. D dimer 0.69. Will get CT angio chest to ro pe. Pain improved  9:19 AM CT angiogram is negative. Patient is feeling some nausea, will treat with Zofran. Suspect that her symptoms could be radicular pain from possible thoracic spine injury. Could be chest wall pain. Patient has Percocet at home, she has muscle relaxants, advised to try heating pads, no heavy lifting, follow-up with primary care doctor.   Vitals:   04/11/16 0627 04/11/16 0913  BP: 132/84 120/63  Pulse: 82 67  Resp: 20 18  Temp: 98.3 F (36.8 C) 97.8 F (36.6 C)  TempSrc: Oral Oral  SpO2: 95% 95%  Weight: (!) 154.2 kg   Height: 5\' 7"  (1.702 m)       Final Clinical Impressions(s) / ED Diagnoses   Final diagnoses:  Chest pain, unspecified type  Left hand paresthesia    New Prescriptions Discharge Medication List as of 04/11/2016  9:28 AM       Jeannett Senior, PA-C 04/11/16 1314    Gwenyth Allegra Tegeler, MD 04/11/16 1623

## 2016-04-12 ENCOUNTER — Telehealth: Payer: Self-pay | Admitting: Internal Medicine

## 2016-04-12 NOTE — Telephone Encounter (Signed)
[facility] After Hours Telephone Line   Number received via page: [phone] Person calling: [name] Relationship to patient: Self  Reason for call: Upper back and neck pain  Patient was seen in ED yesterday for sharp chest, upper back, and neck pain. Chest pain rule out was performed, and patient's pain was determined to be more likely due to radiculopathy 2/2 thoracic spine injury rather than cardiac etiology. Patient was sent home with Percocet and Flexeril and told to follow up with her PCP. Patient's pain has continued despite taking medication. She reports pain feels exactly the same as yesterday. Back and neck pain are worse with laying down and all pain is worse with motion. Patient is not having difficulty breathing. Discussed with patient that since symptoms are the same as yesterday and since ED work-up was negative, patient should continue to take the medications she has been already prescribed and to follow up with her PCP. Offered to schedule appt for patient. She said she preferred to call tomorrow morning to schedule appointment herself.   Adin Hector, MD, MPH PGY-2 [facility] Pager [phone]

## 2016-04-15 ENCOUNTER — Ambulatory Visit (INDEPENDENT_AMBULATORY_CARE_PROVIDER_SITE_OTHER): Payer: Medicare Other | Admitting: Family Medicine

## 2016-04-15 ENCOUNTER — Encounter: Payer: Self-pay | Admitting: Family Medicine

## 2016-04-15 DIAGNOSIS — M546 Pain in thoracic spine: Secondary | ICD-10-CM | POA: Diagnosis not present

## 2016-04-15 DIAGNOSIS — G8929 Other chronic pain: Secondary | ICD-10-CM | POA: Diagnosis not present

## 2016-04-15 MED ORDER — OXYCODONE-ACETAMINOPHEN 7.5-325 MG PO TABS: ORAL_TABLET | ORAL | 0 refills | Status: DC

## 2016-04-15 MED ORDER — TIZANIDINE HCL 4 MG PO TABS: 4.0000 mg | ORAL_TABLET | Freq: Two times a day (BID) | ORAL | 12 refills | Status: DC | PRN

## 2016-04-15 MED ORDER — LISINOPRIL 10 MG PO TABS: 10.0000 mg | ORAL_TABLET | Freq: Every day | ORAL | 3 refills | Status: DC

## 2016-04-16 NOTE — Progress Notes (Signed)
    CHIEF COMPLAINT / HPI:   Upper back pain. Chronic but worse over the last 2 weeks. Was seen at the emergency room and ruled out for acute heart issues. Pain is not being handled by her chronic pain medication for her knee and other joint problems. It is sharp, somewhat radiates up her neck. Not associated with exertion or breathing. Seems to more associated with standing and position.  REVIEW OF SYSTEMS:  No unusual weight change, fever, sweats, chills. No upper extremity weakness.  OBJECTIVE:  Vital signs are reviewed.   GEN.: Well-developed obese female no acute distress BACK: No defect. Tender to palpation in the rhomboid muscle area. She also has some tenderness in the bilateral trapezius area. Palpation here reproduces her pain.  ASSESSMENT / PLAN: Please see problem oriented charting for details

## 2016-04-16 NOTE — Assessment & Plan Note (Signed)
Chronic thoracic back pain related to habitus. We discussed again. We had previously tried to get her referred for surgical breast reduction and somehow that fell through. She would like to address that may be in the upcoming year. Right now for her pain I discussed using exercise therapy such as overhead press, chest press, upright row. We discussed this in detail.

## 2016-05-06 ENCOUNTER — Encounter: Payer: Self-pay | Admitting: Gastroenterology

## 2016-05-06 ENCOUNTER — Ambulatory Visit (INDEPENDENT_AMBULATORY_CARE_PROVIDER_SITE_OTHER): Payer: Medicare Other | Admitting: Gastroenterology

## 2016-05-06 VITALS — BP 124/80 | HR 76 | Ht 67.0 in | Wt 337.4 lb

## 2016-05-06 DIAGNOSIS — R1012 Left upper quadrant pain: Secondary | ICD-10-CM | POA: Diagnosis not present

## 2016-05-06 DIAGNOSIS — R932 Abnormal findings on diagnostic imaging of liver and biliary tract: Secondary | ICD-10-CM

## 2016-05-06 DIAGNOSIS — K2100 Gastro-esophageal reflux disease with esophagitis, without bleeding: Secondary | ICD-10-CM

## 2016-05-06 DIAGNOSIS — R1310 Dysphagia, unspecified: Secondary | ICD-10-CM | POA: Diagnosis not present

## 2016-05-06 MED ORDER — DEXLANSOPRAZOLE 60 MG PO CPDR: 60.0000 mg | DELAYED_RELEASE_CAPSULE | Freq: Every day | ORAL | 1 refills | Status: DC

## 2016-05-06 NOTE — Progress Notes (Signed)
HPI :  46 y/o female with multiple medical problems, here for a follow up visit for reflux, abdominal pain, dysphagia. Initially seen by [facility]. She had an EGD and colonoscopy since her last visit, both done on 03/03/16.  EGD showed esophagitis at the GEJ, 2cm hiatal hernia. with evidence of gastric sleeve surgery. Biopsies negative for H pylori. I had recommended she increase her omeprazole to 40mg  twice daily given this finding, but then transitioned to Dexilant 60mg  daily.  She has been on Dexilant since the now for several weeks. She has some regurgiitation of food, and with heartburn which bothers her. She thinks symptoms bother her most after eating and also when she sleeps and lies down. She does endorse some dysphagia at the lower chest. She has had some weight regain since her gastric bypass - initially lost 90 lbs after surgery but has since regained more than 30 lbs.  She has some "burning sensation" in the LUQ. She reports it is presently constantly, 24/7. She reports present at least for a year or so. Nothing makes it better or worse, but tender to the touch. No rash in the area. She endorses ongoing back pain which is bothering her. She had a CT scan of the abdomen in May which did not show any pathology to account for her symptoms although there was a nonspecific ill defined area in the posterior right lobe.   Colonoscopy was a normal exam, no polyps, recommended a repeat colonoscopy in 5 years given strong FH of colon cancer.   She was seen in the ER on 12/9 for chest pain, had CT angio of the chest which was negative for PE.  She is on Xarelto indefinitively for history of DVT. She is s/p gastric sleeve in 2014.   Past Medical History:  Diagnosis Date  . Anemia    iron def  . Anginal pain ([place])   . Anxiety   . Arthritis    left knee   . Back pain   . Benign gastrointestinal stromal tumor (GIST)   . Chronic knee pain   . Depression   . DVT (deep venous thrombosis)  ([place])   . GERD (gastroesophageal reflux disease)   . Hx of laparoscopic gastric banding   . Hypertension   . Kidney stones   . Migraine   . Morbid obesity ([place])   . PE (pulmonary embolism)      Past Surgical History:  Procedure Laterality Date  . ABDOMINAL HYSTERECTOMY    . BIOPSY  02/16/2012   Procedure: BIOPSY;  Surgeon: [name], MD;  Location: [facility];  Service: General;;  biopsy of mass x 2  . BREATH TEK H PYLORI  07/28/2011   Procedure: [place];  Surgeon: [name], MD;  Location: [facility];  Service: General;  Laterality: N/A;  to be done at 745  . CESAREAN SECTION  05/1991  . COLONOSCOPY WITH PROPOFOL N/A 03/03/2016   Procedure: COLONOSCOPY WITH PROPOFOL;  Surgeon: Manus Gunning, MD;  Location: [facility];  Service: Gastroenterology;  Laterality: N/A;  . ESOPHAGOGASTRODUODENOSCOPY N/A 08/29/2012   Procedure: ESOPHAGOGASTRODUODENOSCOPY (EGD);  Surgeon: [name], MD;  Location: [facility];  Service: General;  Laterality: N/A;  . ESOPHAGOGASTRODUODENOSCOPY N/A 09/04/2014   Procedure: ESOPHAGOGASTRODUODENOSCOPY (EGD);  Surgeon: Alphonsa Overall, MD;  Location: [facility];  Service: General;  Laterality: N/A;  . ESOPHAGOGASTRODUODENOSCOPY (EGD) WITH PROPOFOL N/A 03/03/2016   Procedure: ESOPHAGOGASTRODUODENOSCOPY (EGD) WITH PROPOFOL;  Surgeon: Manus Gunning,  MD;  Location: [facility];  Service: Gastroenterology;  Laterality: N/A;  . EUS  03/03/2012   Procedure: UPPER ENDOSCOPIC ULTRASOUND (EUS) LINEAR;  Surgeon: Milus Banister, MD;  Location: [facility];  Service: Endoscopy;  Laterality: N/A;  . gist    . KNEE ARTHROSCOPY    . LAPAROSCOPIC GASTRECTOMY  04/08/2012   Procedure: LAPAROSCOPIC GASTRECTOMY;  Surgeon: [name], MD;  Location: [facility];  Service: General;;  removal of GIST tumor of stomach  . LAPAROSCOPIC GASTRIC SLEEVE RESECTION N/A 08/29/2012   Procedure: LAPAROSCOPIC GASTRIC SLEEVE RESECTION;  Surgeon: [name], MD;  Location: [facility];  Service: General;  Laterality: N/A;  Sleeve Gastrectomy  . TOTAL KNEE ARTHROPLASTY Left 10/06/2013   Procedure: LEFT TOTAL KNEE ARTHROPLASTY;  Surgeon: Mcarthur Rossetti, MD;  Location: [facility];  Service: Orthopedics;  Laterality: Left;  . TUBAL LIGATION     Family History  Problem Relation Age of Onset  . Diabetes Mother   . Diabetes Father   . Heart disease Father   . Colon cancer Father     brain tumor  . Colon polyps Father   . Colitis Father   . Irritable bowel syndrome Father   . Diabetes Maternal Grandmother   . Diabetes Maternal Grandfather   . Schizophrenia Maternal Grandfather   . Heart disease Paternal Uncle   . Diabetes Paternal Uncle   . Heart disease Paternal Grandmother   . Heart disease Paternal Grandfather   . Diabetes Maternal Aunt   . Diabetes Maternal Uncle   . Diabetes Paternal Aunt    Social History  Substance Use Topics  . Smoking status: Never Smoker  . Smokeless tobacco: Never Used  . Alcohol use 0.0 oz/week     Comment: maybe once a month   Current Outpatient Prescriptions  Medication Sig Dispense Refill  . dexlansoprazole (DEXILANT) 60 MG capsule Take 1 capsule (60 mg total) by mouth daily. 90 capsule 1  . hydrOXYzine (VISTARIL) 25 MG capsule Take 1 capsule (25 mg total) by mouth at bedtime. 30 capsule 2  . lamoTRIgine (LAMICTAL) 200 MG tablet TAKE ONE TABLET AT BEDTIME. 30 tablet 0  . lisinopril (PRINIVIL,ZESTRIL) 10 MG tablet Take 1 tablet (10 mg total) by mouth daily. 90 tablet 3  . meclizine (ANTIVERT) 32 MG tablet Take 1 tablet (32 mg total) by mouth 3 (three) times daily as needed. 30 tablet 0  . mupirocin ointment (BACTROBAN) 2 % Apply t o affected skin area twice daily as directed 60 g 0  . ondansetron (ZOFRAN-ODT) 4 MG disintegrating tablet Take 4 mg by mouth every 8 (eight) hours as needed for nausea.     Marland Kitchen oxyCODONE-acetaminophen (PERCOCET) 7.5-325 MG tablet Take one tablet by mouth every 8 hours as  needed for pain do not fill before July 05 2016 90 tablet 0  . risperiDONE (RISPERDAL) 1 MG tablet Take 1 tablet (1 mg total) by mouth at bedtime. 30 tablet 12  . rivaroxaban (XARELTO) 20 MG TABS tablet Take 1 tablet (20 mg total) by mouth daily with supper. 90 tablet 3  . tiZANidine (ZANAFLEX) 4 MG tablet Take 1 tablet (4 mg total) by mouth 2 (two) times daily as needed for muscle spasms. 60 tablet 12  . VESICARE 5 MG tablet Take 5 mg by mouth daily.      No current facility-administered medications for this visit.    Allergies  Allergen Reactions  . Codeine Rash     Review of Systems: All systems  reviewed and negative except where noted in HPI.    Dg Chest 2 View  Result Date: 04/11/2016 CLINICAL DATA:  Right-sided chest pain and dyspnea, onset last night. EXAM: CHEST  2 VIEW COMPARISON:  12/10/2014 FINDINGS: The heart size and mediastinal contours are within normal limits. Both lungs are clear. The visualized skeletal structures are unremarkable. IMPRESSION: No active cardiopulmonary disease. Electronically Signed   By: Andreas Newport M.D.   On: 04/11/2016 06:48   Ct Angio Chest Pe W And/or Wo Contrast  Result Date: 04/11/2016 CLINICAL DATA:  Right-sided chest pain as well as back pain and bilateral hand tingling. EXAM: CT ANGIOGRAPHY CHEST WITH CONTRAST TECHNIQUE: Multidetector CT imaging of the chest was performed using the standard protocol during bolus administration of intravenous contrast. Multiplanar CT image reconstructions and MIPs were obtained to evaluate the vascular anatomy. CONTRAST:  100 mL Isovue 370 IV. COMPARISON:  CTA chest 12/10/2014 and 10/08/2013 FINDINGS: Cardiovascular: Heart is normal in size. Pulmonary arterial system is within normal without evidence of emboli. Remaining vascular structures are within normal. Mediastinum/Nodes: No evidence of mediastinal or hilar adenopathy. Remaining mediastinal structures are normal. Lungs/Pleura: Lungs are well inflated  without consolidation or effusion. Airways are normal. Upper Abdomen: There are surgical clips over the stomach and gastroesophageal junction compatible with previous bypass surgery. Musculoskeletal: Mild degenerate change of the spine. Vacuum disc at several levels of the lower thoracic spine with new oval 8 mm air collection within the mid to lower thoracic vertebral body likely migrated from the disc space. Review of the MIP images confirms the above findings. IMPRESSION: No acute cardiopulmonary disease. No evidence of pulmonary embolism. Electronically Signed   By: Marin Olp M.D.   On: 04/11/2016 08:59    Lab Results  Component Value Date   WBC 10.8 (H) 04/11/2016   HGB 13.0 04/11/2016   HCT 40.7 04/11/2016   MCV 83.2 04/11/2016   PLT 465 (H) 04/11/2016    Lab Results  Component Value Date   CREATININE 0.64 04/11/2016   BUN 11 04/11/2016   NA 139 04/11/2016   K 4.0 04/11/2016   CL 105 04/11/2016   CO2 26 04/11/2016    Lab Results  Component Value Date   ALT 11 (L) 04/11/2016   AST 16 04/11/2016   ALKPHOS 57 04/11/2016   BILITOT 0.2 (L) 04/11/2016     Physical Exam: BP 124/80   Pulse 76   Ht 5\' 7"  (1.702 m)   Wt (!) 337 lb 6 oz (153 kg)   BMI 52.84 kg/m  Constitutional: Pleasant,well-developed, female in no acute distress. HEENT: Normocephalic and atraumatic. Conjunctivae are normal. No scleral icterus. Neck supple.  Cardiovascular: Normal rate, regular rhythm.  Pulmonary/chest: Effort normal and breath sounds normal. No wheezing, rales or rhonchi. Abdominal: Soft, obese / protuberant, mild LUQ to mid left abdominal TTP, no rebound, positive [place]. There are no masses palpable. No hepatomegaly. Extremities: no edema Lymphadenopathy: No cervical adenopathy noted. Neurological: Alert and oriented to person place and time. Skin: Skin is warm and dry. No rashes noted. Psychiatric: Normal mood and affect. Behavior is normal.   ASSESSMENT AND PLAN: 46 y/o  female here for reassessment of the following issues:  GERD / Dysphagia / Obesity - evidence of esophagitis on omeprazole, with continued symptoms on Dexilant. No pathology noted on EGD to account for dysphagia. It's possible she has a motility disorder causing symptoms, perhaps she also has nonacid reflux. She is morbidly obese despite bariatric surgery and I suspect her  weight regain is making reflux worse and discussed need for weight loss. Otherwise I offered her esophageal manometry to rule out dysmotility and also a 24-hour pH impedance study on Dexilant to clarify she is breaking through PPI, has nonacid reflux, or has hypersensitive esophagus/functional heartburn. I discussed these procedures with her and she wished proceed. In the interim she can use Gaviscon when necessary for breakthrough symptoms while she continues Dexilant.   LUQ Abdominal pain - suspect neuropathic/musculoskeletal based on exam and history. CT scan showed no pathology in this area cause symptoms which are present for the past year. He committed trial of capsaicin cream to see if this helps. If no improvement we'll consider other options, he can contact me in the setting for reassessment.   Abnormal liver imaging - noted on prior CT scan, likely insignificant however given history of GIST I offered her a follow-up ultrasound to ensure normal. She agreed   Ada Cellar, MD [facility] Pager [phone]

## 2016-05-06 NOTE — Patient Instructions (Signed)
If you are age 48 or older, your body mass index should be between 23-30. Your Body mass index is 52.84 kg/m. If this is out of the aforementioned range listed, please consider follow up with your Primary Care Provider.  If you are age 76 or younger, your body mass index should be between 19-25. Your Body mass index is 52.84 kg/m. If this is out of the aformentioned range listed, please consider follow up with your Primary Care Provider.   We have sent the following medications to your pharmacy for you to pick up at your convenience:  Dexilant refilled  Gavison as needed for heart burn ( do not use this medication during [place] Study)  Capcaison cram every 8 hours  You have been scheduled for an esophageal manometry at [facility] on Wednesday January 10th at 8:30am. Please arrive 15 minutes prior to your procedure for registration. You will need to go to outpatient registration (1st floor of the hospital) first. Make certain to bring your insurance cards as well as a complete list of medications.  Please remember the following:  1) Do not take any muscle relaxants, xanax (alprazolam) or ativan for 1 day prior to your     test as well as the day of the test.  2) Nothing to eat or drink after 12:00 midnight on the night before your test.  3) Hold all diabetic medications/insulin the morning of the test. You may eat and take             your medications after the test.  It will take at least 2 weeks to receive the results of this test from your physician. ------------------------------------------ ABOUT ESOPHAGEAL MANOMETRY Esophageal manometry (muh-NOM-uh-tree) is a test that gauges how well your esophagus works. Your esophagus is the long, muscular tube that connects your throat to your stomach. Esophageal manometry measures the rhythmic muscle contractions (peristalsis) that occur in your esophagus when you swallow. Esophageal manometry also measures the coordination and force  exerted by the muscles of your esophagus.  During esophageal manometry, a thin, flexible tube (catheter) that contains sensors is passed through your nose, down your esophagus and into your stomach. Esophageal manometry can be helpful in diagnosing some mostly uncommon disorders that affect your esophagus.  Why it's done Esophageal manometry is used to evaluate the movement (motility) of food through the esophagus and into the stomach. The test measures how well the circular bands of muscle (sphincters) at the top and bottom of your esophagus open and close, as well as the pressure, strength and pattern of the wave of esophageal muscle contractions that moves food along.  What you can expect Esophageal manometry is an outpatient procedure done without sedation. Most people tolerate it well. You may be asked to change into a hospital gown before the test starts.  During esophageal manometry  While you are sitting up, a member of your health care team sprays your throat with a numbing medication or puts numbing gel in your nose or both.  A catheter is guided through your nose into your esophagus. The catheter may be sheathed in a water-filled sleeve. It doesn't interfere with your breathing. However, your eyes may water, and you may gag. You may have a slight nosebleed from irritation.  After the catheter is in place, you may be asked to lie on your back on an exam table, or you may be asked to remain seated.  You then swallow small sips of water. As you do, a  computer connected to the catheter records the pressure, strength and pattern of your esophageal muscle contractions.  During the test, you'll be asked to breathe slowly and smoothly, remain as still as possible, and swallow only when you're asked to do so.  A member of your health care team may move the catheter down into your stomach while the catheter continues its measurements.  The catheter then is slowly withdrawn. The test usually lasts 20  to 30 minutes.  After esophageal manometry  When your esophageal manometry is complete, you may return to your normal activities  This test typically takes 30-45 minutes to complete.  You have been scheduled for an abdominal ultrasound at [place] (1st floor of hospital) on January 8th at 9:30am. Please arrive 15 minutes prior to your appointment for registration. Make certain not to have anything to eat or drink after midnight prior to your appointment. Should you need to reschedule your appointment, please contact radiology at [phone]. This test typically takes about 30 minutes to perform.  Thank You. ________________________________________________________________________________

## 2016-05-11 ENCOUNTER — Ambulatory Visit (HOSPITAL_COMMUNITY): Payer: Medicare Other

## 2016-05-13 ENCOUNTER — Ambulatory Visit (HOSPITAL_COMMUNITY)
Admission: RE | Admit: 2016-05-13 | Discharge: 2016-05-13 | Disposition: A | Discharge status: Home / Self Care | Payer: Medicare Other | Source: Ambulatory Visit | Attending: Gastroenterology | Admitting: Gastroenterology

## 2016-05-13 ENCOUNTER — Encounter: Payer: Self-pay | Admitting: Gastroenterology

## 2016-05-13 ENCOUNTER — Encounter (HOSPITAL_COMMUNITY)
Admission: RE | Disposition: A | Discharge status: Home / Self Care | Payer: Self-pay | Source: Ambulatory Visit | Attending: Gastroenterology

## 2016-05-13 DIAGNOSIS — K219 Gastro-esophageal reflux disease without esophagitis: Secondary | ICD-10-CM | POA: Insufficient documentation

## 2016-05-13 DIAGNOSIS — R1319 Other dysphagia: Secondary | ICD-10-CM

## 2016-05-13 DIAGNOSIS — R1310 Dysphagia, unspecified: Secondary | ICD-10-CM

## 2016-05-13 HISTORY — PX: ESOPHAGEAL MANOMETRY: SHX5429

## 2016-05-13 HISTORY — PX: 24 HOUR PH STUDY: SHX5419

## 2016-05-13 SURGERY — MONITORING, ESOPHAGEAL PH, 24 HOUR

## 2016-05-13 MED ORDER — LIDOCAINE VISCOUS 2 % MT SOLN
OROMUCOSAL | Status: AC
  Filled 2016-05-13: qty 15

## 2016-05-13 SURGICAL SUPPLY — 2 items
FACESHIELD LNG OPTICON STERILE (SAFETY) IMPLANT
GLOVE BIO SURGEON STRL SZ8 (GLOVE) ×4 IMPLANT

## 2016-05-13 NOTE — Progress Notes (Signed)
Esophageal Manometry and Ph impedence study done per protocol.  Pt. Tolerated both procedures well, without complications.  No blood noted on either probe placement.  Study to be sent to Dr. Havery Moros.

## 2016-05-14 ENCOUNTER — Ambulatory Visit (HOSPITAL_COMMUNITY)
Admission: RE | Admit: 2016-05-14 | Discharge: 2016-05-14 | Disposition: A | Discharge status: Home / Self Care | Payer: Medicare Other | Source: Ambulatory Visit | Attending: Gastroenterology | Admitting: Gastroenterology

## 2016-05-14 ENCOUNTER — Encounter: Payer: Self-pay | Admitting: Gastroenterology

## 2016-05-14 DIAGNOSIS — D1803 Hemangioma of intra-abdominal structures: Secondary | ICD-10-CM | POA: Diagnosis not present

## 2016-05-14 DIAGNOSIS — R935 Abnormal findings on diagnostic imaging of other abdominal regions, including retroperitoneum: Secondary | ICD-10-CM | POA: Diagnosis not present

## 2016-05-14 DIAGNOSIS — R932 Abnormal findings on diagnostic imaging of liver and biliary tract: Secondary | ICD-10-CM

## 2016-05-14 DIAGNOSIS — K824 Cholesterolosis of gallbladder: Secondary | ICD-10-CM | POA: Diagnosis not present

## 2016-05-14 NOTE — Progress Notes (Signed)
Letter mailed

## 2016-05-15 ENCOUNTER — Encounter (HOSPITAL_COMMUNITY): Payer: Self-pay | Admitting: Gastroenterology

## 2016-05-18 DIAGNOSIS — K121 Other forms of stomatitis: Secondary | ICD-10-CM | POA: Diagnosis not present

## 2016-05-18 DIAGNOSIS — K0263 Dental caries on smooth surface penetrating into pulp: Secondary | ICD-10-CM | POA: Diagnosis not present

## 2016-05-20 ENCOUNTER — Encounter: Payer: Self-pay | Admitting: Gastroenterology

## 2016-05-22 NOTE — Telephone Encounter (Signed)
Dr Havery Moros, What do I need to inform this pt of? Thank you.

## 2016-05-25 ENCOUNTER — Telehealth: Payer: Self-pay | Admitting: Gastroenterology

## 2016-05-25 NOTE — Telephone Encounter (Signed)
Called patient back, went to vm and left message that she can call us back with her questions.

## 2016-05-27 DIAGNOSIS — R1310 Dysphagia, unspecified: Secondary | ICD-10-CM

## 2016-05-27 DIAGNOSIS — R1319 Other dysphagia: Secondary | ICD-10-CM

## 2016-05-28 ENCOUNTER — Telehealth: Payer: Self-pay | Admitting: Gastroenterology

## 2016-05-28 NOTE — Telephone Encounter (Signed)
Called patient and discussed results. Suspect fragmented peristalsis may be causing some of her mild dysphagia, but her reflux seems well controlled on Dexilant.  She reports she had been doing okay but thinks most of her symptoms bother her when feeling anxious and has been having difficulty with anxiety recently. Recommend she discuss options for managing anxiety with her primary care which may help her upper tract symptoms.  Otherwise, she has not yet tried Capsaicin cream for LUQ pain which I suspect is musculoskeletal and she will try this. Otherwise needs repeat US in one year given GB polyp noted on recent US. She agreed. Otherwise f/u PRN

## 2016-05-28 NOTE — Telephone Encounter (Signed)
Results of manometry and 24 Hr pH impedance studies returned as follows:  Esophageal manometry (report to be scanned into Epic): - quality of study is decreased by double swallows - normal resting LES pressure with complete relaxation - 100% of swallows had peristaltic contractions, but 50% of swallows had large breaks in peristalsis  Overall, suboptimal study due to double swallows however fragmented peristalsis noted with some poor bolus clearance, but no other major abnormality, no achalasia    24 HR pH impedance study: Study performed ON PPI (Dexilant)  Results: - Study time of 24 hrs: adequate study time - Demeester score of 5.5 (14.72 = upper limit of normal) - no evidence of pathologic reflux - Symptom index of only 33% (3/9) for acid reflux episodes, and 44% (4/9) for all reflux episodes  - most reflux episodes that occurred were weakly acidic (32/42)  Summary and Interpretation: - study consistent with controlled acid reflux disease on PPI - symptoms reported did NOT correlate well with reflux episodes - there could be a component of functional heartburn to symptoms in addition to nonspecific changes noted on manometry   Continue Dexilant, patient will be contact with further recommendations

## 2016-06-11 ENCOUNTER — Telehealth (INDEPENDENT_AMBULATORY_CARE_PROVIDER_SITE_OTHER): Payer: Self-pay | Admitting: Physical Medicine and Rehabilitation

## 2016-06-11 NOTE — Telephone Encounter (Signed)
Yes, chronic problem, morbid obesity so RFA not applicable

## 2016-06-11 NOTE — Telephone Encounter (Signed)
No auth required

## 2016-06-16 ENCOUNTER — Telehealth (INDEPENDENT_AMBULATORY_CARE_PROVIDER_SITE_OTHER): Payer: Self-pay | Admitting: Physical Medicine and Rehabilitation

## 2016-06-16 ENCOUNTER — Encounter (INDEPENDENT_AMBULATORY_CARE_PROVIDER_SITE_OTHER): Payer: Medicare Other | Admitting: Physical Medicine and Rehabilitation

## 2016-06-16 NOTE — Telephone Encounter (Signed)
Called patient and left message for her to call. We cannot see her this afternoon at 4, but I told her we may be able to look at something later this week or early next week depending on transportation.

## 2016-06-16 NOTE — Telephone Encounter (Signed)
Rescheduled for 06/22/16 at 1530.

## 2016-06-16 NOTE — Telephone Encounter (Signed)
Patient called advised she has an appointment at 2:00pm and want to know if she can come in around 4:00pm. She advised her transportation had to go to work. Patient said she got  someone else who can bring her at by 3:30pm. The number to contact her is [phone]

## 2016-06-17 ENCOUNTER — Encounter: Payer: Self-pay | Admitting: Family Medicine

## 2016-06-17 ENCOUNTER — Ambulatory Visit (INDEPENDENT_AMBULATORY_CARE_PROVIDER_SITE_OTHER): Payer: Medicare Other | Admitting: Family Medicine

## 2016-06-17 VITALS — BP 110/80 | HR 77 | Temp 98.2°F | Ht 67.0 in | Wt 342.0 lb

## 2016-06-17 DIAGNOSIS — N62 Hypertrophy of breast: Secondary | ICD-10-CM | POA: Diagnosis not present

## 2016-06-19 NOTE — Progress Notes (Signed)
    CHIEF COMPLAINT / HPI:   Intermittent breast pain. It alternates sometimes on the left, sometimes on the right. Today she'll have some days not. Mostly okay and laterally and more often in the left versus the right breast. Pain is moderate 2-3 out of 10 at its worst. She's had no nipple discharge. She's noted no redness of the breast. She has not had a screening mammogram in a while  REVIEW OF SYSTEMS:  No fever, no unusual weight change.  OBJECTIVE:  Vital signs are reviewed.   GEN.: Well-developed obese female no acute distress 6 BREASTS: Bilaterally symmetrical. There are no worrisome masses or nodular areas. There is some diffuse mild tenderness and they'll left lateral breast. SKIN: No erythema, no induration, no rash or lesions noted in the breast area nipples are without any sign of discharge, no cracking. LUNGS: Clear to auscultation bilaterally  ASSESSMENT / PLAN: Intermittent mild to moderate breast pain. I think this is related to macromastia. I do think she needs to get her screening mammogram done but I do not think she has anything that would warmth diagnostic mammogram. Give her information on setting up screening mammogram. She'll follow-up on regular schedule.

## 2016-06-22 ENCOUNTER — Encounter (INDEPENDENT_AMBULATORY_CARE_PROVIDER_SITE_OTHER): Payer: Medicaid Other | Admitting: Physical Medicine and Rehabilitation

## 2016-07-23 DIAGNOSIS — R8271 Bacteriuria: Secondary | ICD-10-CM | POA: Diagnosis not present

## 2016-07-23 DIAGNOSIS — R3129 Other microscopic hematuria: Secondary | ICD-10-CM | POA: Diagnosis not present

## 2016-08-05 ENCOUNTER — Ambulatory Visit (INDEPENDENT_AMBULATORY_CARE_PROVIDER_SITE_OTHER): Payer: Medicare Other | Admitting: Family Medicine

## 2016-08-05 ENCOUNTER — Encounter: Payer: Self-pay | Admitting: Family Medicine

## 2016-08-05 VITALS — BP 118/82 | HR 85 | Temp 98.2°F | Ht 67.0 in | Wt 340.2 lb

## 2016-08-05 DIAGNOSIS — F3130 Bipolar disorder, current episode depressed, mild or moderate severity, unspecified: Secondary | ICD-10-CM | POA: Diagnosis not present

## 2016-08-05 DIAGNOSIS — R21 Rash and other nonspecific skin eruption: Secondary | ICD-10-CM | POA: Diagnosis not present

## 2016-08-05 DIAGNOSIS — K2100 Gastro-esophageal reflux disease with esophagitis, without bleeding: Secondary | ICD-10-CM

## 2016-08-05 DIAGNOSIS — I82493 Acute embolism and thrombosis of other specified deep vein of lower extremity, bilateral: Secondary | ICD-10-CM

## 2016-08-05 DIAGNOSIS — R1310 Dysphagia, unspecified: Secondary | ICD-10-CM | POA: Diagnosis not present

## 2016-08-05 DIAGNOSIS — F3170 Bipolar disorder, currently in remission, most recent episode unspecified: Secondary | ICD-10-CM

## 2016-08-05 MED ORDER — OXYCODONE-ACETAMINOPHEN 7.5-325 MG PO TABS: ORAL_TABLET | ORAL | 0 refills | Status: DC

## 2016-08-05 MED ORDER — DEXLANSOPRAZOLE 60 MG PO CPDR: 60.0000 mg | DELAYED_RELEASE_CAPSULE | Freq: Every day | ORAL | 3 refills | Status: DC

## 2016-08-05 MED ORDER — NYSTATIN 100000 UNIT/GM EX CREA: TOPICAL_CREAM | CUTANEOUS | 5 refills | Status: DC

## 2016-08-05 MED ORDER — RISPERIDONE 1 MG PO TABS: 1.0000 mg | ORAL_TABLET | Freq: Every day | ORAL | 3 refills | Status: DC

## 2016-08-05 MED ORDER — LAMOTRIGINE 200 MG PO TABS: 200.0000 mg | ORAL_TABLET | Freq: Every day | ORAL | 3 refills | Status: DC

## 2016-08-05 MED ORDER — RIVAROXABAN 20 MG PO TABS: 20.0000 mg | ORAL_TABLET | Freq: Every day | ORAL | 3 refills | Status: DC

## 2016-08-05 MED ORDER — TIZANIDINE HCL 4 MG PO TABS: 4.0000 mg | ORAL_TABLET | Freq: Two times a day (BID) | ORAL | 12 refills | Status: DC | PRN

## 2016-08-06 NOTE — Assessment & Plan Note (Signed)
Recent EGD negative for recurrence of GIST. Her gastric intestinal doctor increased her proton pump inhibitor to twice a day. Her symptoms are improved.

## 2016-08-06 NOTE — Assessment & Plan Note (Signed)
Continue chronic Xarelto

## 2016-08-06 NOTE — Assessment & Plan Note (Signed)
Bipolar disorder seems to be stable. She needs refills and has not been to get an appointment back with her psychiatrist for the next 2 months. I will be fine with refilling her medicines in the interim as she has been quite stable on these medicines

## 2016-08-06 NOTE — Progress Notes (Signed)
    CHIEF COMPLAINT / HPI:   #1. Rash. Having some skin irritation and itching in the crease of her abdominal folds. Has had this before and we gave her some cream that seemed to help. It actually gone totally away for a while #2. Knee pain: She has returned to work and is having increasing knee pain not she's on her feet more. She continues on the chronic pain management per our plan. #3. History of GIST: Been having some abdominal pain and recently saw the GI doctor who did endoscopy which was negative. She's quite excited about this. Continues to have intermittent epigastric pain but it is some better since he increased her proton pump inhibitor.  REVIEW OF SYSTEMS:  No unusual weight change. See history of present illness above for additional pertinent review of systems  OBJECTIVE:  Vital signs are reviewed.   GEN.: Well-developed overweight female no acute distress KNEES: Bilaterally she has crepitus on extension. There is no effusion. She has soft calves and benign popliteal space. ABDOMEN: Soft, positive bowel sounds nontender nondistended  SKIN: Area in the pannus reveals hyperpigmentation and some irritated skin, no overt lesions.  ASSESSMENT / PLAN: Please see problem oriented charting for details  Additional problem: Candidal intertrigo. I gave her some nystatin ointment. We discussed keeping the area dry. Do not use powder. Weight loss.

## 2016-08-20 ENCOUNTER — Ambulatory Visit (INDEPENDENT_AMBULATORY_CARE_PROVIDER_SITE_OTHER): Payer: Medicare Other | Admitting: Podiatry

## 2016-08-20 ENCOUNTER — Ambulatory Visit (INDEPENDENT_AMBULATORY_CARE_PROVIDER_SITE_OTHER): Payer: Medicare Other

## 2016-08-20 ENCOUNTER — Encounter: Payer: Self-pay | Admitting: Podiatry

## 2016-08-20 DIAGNOSIS — M19071 Primary osteoarthritis, right ankle and foot: Secondary | ICD-10-CM

## 2016-08-20 DIAGNOSIS — M19072 Primary osteoarthritis, left ankle and foot: Secondary | ICD-10-CM | POA: Diagnosis not present

## 2016-08-20 DIAGNOSIS — M25571 Pain in right ankle and joints of right foot: Secondary | ICD-10-CM

## 2016-08-20 DIAGNOSIS — M722 Plantar fascial fibromatosis: Secondary | ICD-10-CM | POA: Diagnosis not present

## 2016-08-20 DIAGNOSIS — M779 Enthesopathy, unspecified: Secondary | ICD-10-CM | POA: Diagnosis not present

## 2016-08-21 NOTE — Progress Notes (Signed)
Subjective: 46 year old female presents the office today for concerns of left foot as well as right ankle pain. She's the left side continues to be pain. She said that she had some swelling to the area about a month ago and started sternal bright white in color. Since then it has improved but she still any pain to the outside aspect of her left foot mostly with walking. The pain is worse with prolonged activity. Pain does not wake her up at night. She is also getting pain in the right ankle and she points to just to the lateral aspect of the ankle. This has been ongoing for last couple weeks as well. Denies any recent injury or trauma. Denies any systemic complaints such as fevers, chills, nausea, vomiting. No acute changes since last appointment, and no other complaints at this time.   Objective: AAO x3, NAD DP/PT pulses palpable bilaterally, CRT less than 3 seconds On the left foot there is tenderness along the fourth and fifth metatarsal cuboid joint. There is no specific area pinpoint bony tenderness there is no pain vibratory sensation. There is no significant edema to this area. There is a mild edema to the subtalar joint along the sinus tarsi and very minimal pain to this area. There is no other areas of tenderness in the left foot. The right ankle there is tenderness on the course of the peroneal tendon just posterior to the lateral malleolus. The tendon appears to be intact. No pain of the fibula, syndesmosis, tibia or deltoid ligaments. No pain the right foot. No other areas of tenderness identified bilaterally. No open lesions or pre-ulcerative lesions.  No pain with calf compression, swelling, warmth, erythema  Assessment: Left foot pain likely arthritis/capsulitis; right peroneal tendinitis  Plan: -All treatment options discussed with the patient including all alternatives, risks, complications.  -X-rays were obtained and reviewed with the patient. No evidence of acute fracture identified  bilaterally. -On the left foot discussed steroid injection she wishes to proceed. Under sterile conditions mature Kenalog and local anesthetic was infiltrated without complications into the area of maximal tenderness. Postinjection care was discussed. At this time given her symptoms on her left foot is been ongoing for greater than 1 year I recommended MRI to further evaluate for osteoarthritis.  -On the right side recommended Tri-Lock ankle brace which was dispensed today. Ice to this area. -Order compound cream for antiinflammatory as she cannot take oral.  -Discussed inserts.  -Patient encouraged to call the office with any questions, concerns, change in symptoms.   Celesta Gentile, DPM

## 2016-08-27 ENCOUNTER — Telehealth: Payer: Self-pay | Admitting: Podiatry

## 2016-08-27 DIAGNOSIS — M19072 Primary osteoarthritis, left ankle and foot: Secondary | ICD-10-CM

## 2016-08-27 NOTE — Telephone Encounter (Signed)
Yes, it is mentioned in my note. Not sure where the communication fell through.

## 2016-08-27 NOTE — Addendum Note (Signed)
Addended by: Harriett Sine D on: 08/27/2016 04:53 PM   Modules accepted: Orders

## 2016-08-27 NOTE — Telephone Encounter (Signed)
Pt. Said she was suppose to be scheduled for a MRI and has not heard anything.

## 2016-08-27 NOTE — Telephone Encounter (Addendum)
Dr, Jacqualyn Posey ordered left foot MRI evaluate osteoarthritis. Orders to [facility] and faxed to [place]. I informed pt of Dr. Leigh Aurora orders and told her [facility] would call to help her set up an appt after the MRI had been pre-certed.

## 2016-09-03 ENCOUNTER — Ambulatory Visit (INDEPENDENT_AMBULATORY_CARE_PROVIDER_SITE_OTHER): Payer: Medicare Other

## 2016-09-03 ENCOUNTER — Ambulatory Visit (INDEPENDENT_AMBULATORY_CARE_PROVIDER_SITE_OTHER): Payer: Medicare Other | Admitting: Podiatry

## 2016-09-03 DIAGNOSIS — M779 Enthesopathy, unspecified: Secondary | ICD-10-CM

## 2016-09-03 DIAGNOSIS — M25571 Pain in right ankle and joints of right foot: Secondary | ICD-10-CM

## 2016-09-03 MED ORDER — TRIAMCINOLONE ACETONIDE 10 MG/ML IJ SUSP
10.0000 mg | Freq: Once | INTRAMUSCULAR | Status: AC
  Administered 2016-09-03: 10 mg

## 2016-09-05 NOTE — Progress Notes (Signed)
Subjective:    Patient ID: Crystal Garza, female   DOB: 46 y.o.   MRN: [id number]   HPI patient states that she turned her right ankle and she's developed pain that's been chronic in the joint and she is wondering if there's anything we can do to help her with healing. States it's been around a month    Review of Systems  All other systems reviewed and are negative.       Objective:  Physical Exam  Cardiovascular: Intact distal pulses.   Musculoskeletal: Normal range of motion.  Skin: Skin is warm.  Nursing note and vitals reviewed.  neurovascular status intact muscle strength adequate range of motion within normal limits with patient found to have inflammation pain in the right sinus tarsi with no indications of ligament instability with mild edema in the lateral ankle and mild discomfort when palpated. Patient's found have good digital perfusion well oriented 3    Assessment:    Possibility that she may have developed sinus tarsitis inflammation secondary to injury and compensation with gait with what appears to be apparent recovery from ankle sprain     Plan:    H&P condition reviewed and explained to patient. I did go ahead today and I injected the sinus tarsi 3 mg Kenalog 5 mill grams Xylocaine to reduce inflammation advised on anti-inflammatories supportive therapy and patient will be seen back to recheck.  X-rays of ankle were negative for signs of fracture or diastases injury or other pathology

## 2016-09-11 ENCOUNTER — Inpatient Hospital Stay: Admission: RE | Admit: 2016-09-11 | Payer: Self-pay | Source: Ambulatory Visit

## 2016-09-12 ENCOUNTER — Emergency Department (HOSPITAL_COMMUNITY)
Admission: EM | Admit: 2016-09-12 | Discharge: 2016-09-13 | Disposition: A | Discharge status: Home / Self Care | Payer: Medicare Other | Attending: Emergency Medicine | Admitting: Emergency Medicine

## 2016-09-12 ENCOUNTER — Encounter (HOSPITAL_COMMUNITY): Payer: Self-pay | Admitting: Emergency Medicine

## 2016-09-12 ENCOUNTER — Emergency Department (HOSPITAL_COMMUNITY): Payer: Medicare Other

## 2016-09-12 DIAGNOSIS — Z79899 Other long term (current) drug therapy: Secondary | ICD-10-CM | POA: Diagnosis not present

## 2016-09-12 DIAGNOSIS — R0789 Other chest pain: Secondary | ICD-10-CM | POA: Diagnosis not present

## 2016-09-12 DIAGNOSIS — I10 Essential (primary) hypertension: Secondary | ICD-10-CM | POA: Diagnosis not present

## 2016-09-12 DIAGNOSIS — R079 Chest pain, unspecified: Secondary | ICD-10-CM | POA: Diagnosis not present

## 2016-09-12 DIAGNOSIS — M79604 Pain in right leg: Secondary | ICD-10-CM | POA: Diagnosis not present

## 2016-09-12 DIAGNOSIS — I2699 Other pulmonary embolism without acute cor pulmonale: Secondary | ICD-10-CM | POA: Insufficient documentation

## 2016-09-12 DIAGNOSIS — Z7901 Long term (current) use of anticoagulants: Secondary | ICD-10-CM | POA: Insufficient documentation

## 2016-09-12 DIAGNOSIS — R071 Chest pain on breathing: Secondary | ICD-10-CM | POA: Diagnosis present

## 2016-09-12 DIAGNOSIS — Z96652 Presence of left artificial knee joint: Secondary | ICD-10-CM | POA: Diagnosis not present

## 2016-09-12 LAB — CBC
HCT: 39.3 % (ref 36.0–46.0)
Hemoglobin: 12.3 g/dL (ref 12.0–15.0)
MCH: 26.3 pg (ref 26.0–34.0)
MCHC: 31.3 g/dL (ref 30.0–36.0)
MCV: 84 fL (ref 78.0–100.0)
Platelets: 461 10*3/uL — ABNORMAL HIGH (ref 150–400)
RBC: 4.68 MIL/uL (ref 3.87–5.11)
RDW: 15.3 % (ref 11.5–15.5)
WBC: 12.9 10*3/uL — ABNORMAL HIGH (ref 4.0–10.5)

## 2016-09-12 LAB — I-STAT TROPONIN, ED: Troponin i, poc: 0 ng/mL (ref 0.00–0.08)

## 2016-09-12 NOTE — ED Notes (Signed)
RN at bedside collecting labs 

## 2016-09-12 NOTE — ED Triage Notes (Signed)
Pt reports having new onset of chest pain that started around 2130 tonight. Pt reports having cramping in legs and prior hx of blood clots in bilateral legs. Pt states pain in chest is substernal and radiates to right neck.

## 2016-09-12 NOTE — ED Notes (Signed)
Patient transported to X-ray 

## 2016-09-12 NOTE — ED Provider Notes (Signed)
[place] DEPT Provider Note   CSN: [id number] Arrival date & time: 09/12/16  2227  By signing my name below, I, Margit Banda, attest that this documentation has been prepared under the direction and in the presence of Earsie Humm PA-C.  Electronically Signed: Margit Banda, ED Scribe. 09/12/16. 11:10 PM.  History   Chief Complaint Chief Complaint  Patient presents with  . Chest Pain    HPI Crystal Garza is a 46 y.o. female who presents to the Emergency Department complaining of constant, cramping, CP that started ~ 9:20-9:30 pm on 09/12/16. Pt was laying in bed when her right leg started cramping. She tried standing up, but was unable to. Associated sx include SOB and nausea. Pain is exacerbated with deep breaths. 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 She has been regularly taking Xarelto since June 2015 and reports compliance. No hx of heart disease. No recent travel. Pt denies abdominal pain, fever or any other sx at this time.  The history is provided by the patient. No language interpreter was used.    Past Medical History:  Diagnosis Date  . Anemia    iron def  . Anginal pain ([place])   . Anxiety   . Arthritis    left knee   . Back pain   . Benign gastrointestinal stromal tumor (GIST)   . Chronic knee pain   . Depression   . DVT (deep venous thrombosis) ([place])   . GERD (gastroesophageal reflux disease)   . Hx of laparoscopic gastric banding   . Hypertension   . Kidney stones   . Migraine   . Morbid obesity ([place])   . PE (pulmonary embolism)     Patient Active Problem List   Diagnosis Date Noted  . Esophageal dysphagia   . Nausea without vomiting   . Family history of colon cancer   . Dizziness 11/08/2015  . 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 Chronic neck and back pain 02/27/2015  . Other chest pain 12/10/2014  . Thrombocytosis ([place]) 08/29/2014  . Medication management 08/22/2014  . Hematuria 08/22/2014  . Pain in joint, ankle and foot 05/23/2014  . Chronic  anticoagulation 05/17/2014  . Chronic thoracic back pain 04/11/2014  . Right flank pain 01/17/2014  . Degenerative arthritis of left knee 10/06/2013  . Status post total knee replacement 10/06/2013  . Edema 07/28/2013  . Chronic pain 06/13/2013  . 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 Bipolar disorder ([place]) 01/19/2012  . Nephrolithiasis 02/20/2011  . Eczematous dermatitis 10/22/2010  . Iron deficiency anemia 06/27/2010  . Anxiety 06/27/2010  . Migraine 09/12/2009  . Morbid obesity-BMI 62 10/30/2008  . Osteoarthrosis, unspecified whether generalized or localized, involving lower leg 07/05/2007  . DEPRESSION, MAJOR, RECURRENT 07/01/2006  . HYPERTENSION, BENIGN SYSTEMIC 07/01/2006  . RHINITIS, ALLERGIC 07/01/2006  . Gastroesophageal reflux disease 07/01/2006    Past Surgical History:  Procedure Laterality Date  . [place] STUDY N/A 05/13/2016   Procedure: [place] STUDY;  Surgeon: Manus Gunning, MD;  Location: [facility];  Service: Gastroenterology;  Laterality: N/A;  . ABDOMINAL HYSTERECTOMY    . BIOPSY  02/16/2012   Procedure: BIOPSY;  Surgeon: [name], MD;  Location: [facility];  Service: General;;  biopsy of mass x 2  . BREATH TEK H PYLORI  07/28/2011   Procedure: BREATH TEK H PYLORI;  Surgeon: [name], MD;  Location: [facility];  Service: General;  Laterality: N/A;  to be done at 745  . CESAREAN SECTION  05/1991  . COLONOSCOPY WITH PROPOFOL N/A 03/03/2016   Procedure: COLONOSCOPY WITH PROPOFOL;  Surgeon: Manus Gunning, MD;  Location: [facility];  Service: Gastroenterology;  Laterality: N/A;  . ESOPHAGEAL MANOMETRY N/A 05/13/2016   Procedure: ESOPHAGEAL MANOMETRY (EM);  Surgeon: Manus Gunning, MD;  Location: [facility];  Service: Gastroenterology;  Laterality: N/A;  . ESOPHAGOGASTRODUODENOSCOPY N/A 08/29/2012   Procedure: ESOPHAGOGASTRODUODENOSCOPY (EGD);   Surgeon: [name], MD;  Location: [facility];  Service: General;  Laterality: N/A;  . ESOPHAGOGASTRODUODENOSCOPY N/A 09/04/2014   Procedure: ESOPHAGOGASTRODUODENOSCOPY (EGD);  Surgeon: Alphonsa Overall, MD;  Location: [facility];  Service: General;  Laterality: N/A;  . ESOPHAGOGASTRODUODENOSCOPY (EGD) WITH PROPOFOL N/A 03/03/2016   Procedure: ESOPHAGOGASTRODUODENOSCOPY (EGD) WITH PROPOFOL;  Surgeon: Manus Gunning, MD;  Location: [facility];  Service: Gastroenterology;  Laterality: N/A;  . EUS  03/03/2012   Procedure: UPPER ENDOSCOPIC ULTRASOUND (EUS) LINEAR;  Surgeon: Milus Banister, MD;  Location: [facility];  Service: Endoscopy;  Laterality: N/A;  . gist    . KNEE ARTHROSCOPY    . LAPAROSCOPIC GASTRECTOMY  04/08/2012   Procedure: LAPAROSCOPIC GASTRECTOMY;  Surgeon: [name], MD;  Location: [facility];  Service: General;;  removal of GIST tumor of stomach  . LAPAROSCOPIC GASTRIC SLEEVE RESECTION N/A 08/29/2012   Procedure: LAPAROSCOPIC GASTRIC SLEEVE RESECTION;  Surgeon: [name], MD;  Location: [facility];  Service: General;  Laterality: N/A;  Sleeve Gastrectomy  . TOTAL KNEE ARTHROPLASTY Left 10/06/2013   Procedure: LEFT TOTAL KNEE ARTHROPLASTY;  Surgeon: Mcarthur Rossetti, MD;  Location: [facility];  Service: Orthopedics;  Laterality: Left;  . TUBAL LIGATION      OB History    No data available       Home Medications    Prior to Admission medications   Medication Sig Start Date End Date Taking? Authorizing Provider  dexlansoprazole (DEXILANT) 60 MG capsule Take 1 capsule (60 mg total) by mouth daily. 08/05/16 07/31/17  Dickie La, MD  hydrOXYzine (VISTARIL) 25 MG capsule Take 1 capsule (25 mg total) by mouth at bedtime. 08/07/15   Arfeen, Arlyce Harman, MD  lamoTRIgine (LAMICTAL) 200 MG tablet Take 1 tablet (200 mg total) by mouth at bedtime. 08/05/16   Dickie La, MD  lisinopril (PRINIVIL,ZESTRIL) 10 MG tablet Take 1 tablet (10 mg total) by mouth daily. 04/15/16   Dickie La, MD  meclizine (ANTIVERT) 32 MG tablet Take 1 tablet (32 mg total) by mouth 3 (three) times daily as needed. 11/08/15   Nicolette Bang, DO  mupirocin ointment (BACTROBAN) 2 % Apply t o affected skin area twice daily as directed 02/19/16   Dickie La, MD  nystatin cream (MYCOSTATIN) APPLY TOPICALLY 2 TIMES A DAY 08/05/16   Dickie La, MD  ondansetron (ZOFRAN-ODT) 4 MG disintegrating tablet Take 4 mg by mouth every 8 (eight) hours as needed for nausea.  01/23/16   [provider]  oxyCODONE-acetaminophen (PERCOCET) 7.5-325 MG tablet Take one tablet by mouth every 8 hours as needed for pain do not fill before October 04 2016 08/05/16   Dickie La, MD  risperiDONE (RISPERDAL) 1 MG tablet Take 1 tablet (1 mg total) by mouth at bedtime. 08/05/16   Dickie La, MD  rivaroxaban (XARELTO) 20 MG TABS tablet Take 1 tablet (20 mg total) by mouth daily with supper. 08/05/16   Dickie La, MD  tiZANidine (ZANAFLEX)  4 MG tablet Take 1 tablet (4 mg total) by mouth 2 (two) times daily as needed for muscle spasms. 08/05/16   Dickie La, MD  VESICARE 5 MG tablet Take 5 mg by mouth daily.  10/03/14   [provider]    Family History Family History  Problem Relation Age of Onset  . Diabetes Mother   . Diabetes Father   . Heart disease Father   . Colon cancer Father        brain tumor  . Colon polyps Father   . Colitis Father   . Irritable bowel syndrome Father   . Diabetes Maternal Grandmother   . Diabetes Maternal Grandfather   . Schizophrenia Maternal Grandfather   . Heart disease Paternal Uncle   . Diabetes Paternal Uncle   . Heart disease Paternal Grandmother   . Heart disease Paternal Grandfather   . Diabetes Maternal Aunt   . Diabetes Maternal Uncle   . Diabetes Paternal Aunt     Social History Social History  Substance Use Topics  . Smoking status: Never Smoker  . Smokeless tobacco: Never Used  . Alcohol use 0.0 oz/week     Comment: maybe once a month      Allergies   Codeine   Review of Systems Review of Systems  Constitutional: Negative for fever.  HENT: Negative.   Respiratory: Positive for shortness of breath. Negative for cough.   Cardiovascular: Positive for chest pain.  Gastrointestinal: Positive for nausea. Negative for abdominal pain and vomiting.  Musculoskeletal:       Right LE pain.  Skin: Negative for wound.  Neurological: Negative.  Negative for syncope, weakness and numbness.     Physical Exam Updated Vital Signs BP 136/64 (BP Location: Left Arm)   Pulse 85   Temp 97.9 F (36.6 C) (Oral)   Resp 20   Ht 5\' 7"  (1.702 m)   Wt (!) 340 lb (154.2 kg)   SpO2 100%   BMI 53.25 kg/m   Physical Exam  Constitutional: She is oriented to person, place, and time. She appears well-developed and well-nourished. No distress.  HENT:  Head: Normocephalic and atraumatic.  Eyes: Conjunctivae are normal.  Neck: Normal range of motion.  Cardiovascular: Normal rate and intact distal pulses.   Pulmonary/Chest: Effort normal and breath sounds normal. She has no wheezes. She has no rales. She exhibits tenderness (central).  Abdominal: She exhibits no distension. There is no tenderness.  Morbidly obese.  Musculoskeletal: Normal range of motion. She exhibits no edema.  No focal tenderness of LE's.   Neurological: She is alert and oriented to person, place, and time.  Skin: Skin is warm and dry. No pallor.  Psychiatric: She has a normal mood and affect. Her behavior is normal.  Nursing note and vitals reviewed.    ED Treatments / Results  DIAGNOSTIC STUDIES: Oxygen Saturation is 100% on RA, normal by my interpretation.   COORDINATION OF CARE: 11:10 PM-Discussed next steps with pt. Pt verbalized understanding and is agreeable with the plan.    Labs (all labs ordered are listed, but only abnormal results are displayed) Labs Reviewed  [place], ED    EKG  EKG  Interpretation  Date/Time:  Saturday Sep 12 2016 22:35:19 EDT Ventricular Rate:  83 PR Interval:    QRS Duration: 82 QT Interval:  345 QTC Calculation: 406 R Axis:   53 Text Interpretation:  Sinus rhythm When compared to prior, no signifncant changes.  No STEMI Confirmed by Sherry Ruffing MD, [place] [id number]) on 09/12/2016 11:07:52 PM       Radiology No results found.  Procedures Procedures (including critical care time)  Medications Ordered in ED Medications - No data to display   Initial Impression / Assessment and Plan / ED Course  I have reviewed the triage vital signs and the nursing notes.  Pertinent labs & imaging results that were available during my care of the patient were reviewed by me and considered in my medical decision making (see chart for details).     Patient with a history of DVT/PE on Xarelto presents with chest pain and SOB that followed right LE pain starting earlier tonight. She denies having missed any recent doses of Xarelto. No fever or cough.  Chest wall tender to palpation. Given history of PE, CT angio performed to evaluate for worsening clot burden. Suboptimal study, however, is negative for central or lobar PE. Will have her return to the ED in the morning ([facility]) for doppler study. Pain managed.  Final Clinical Impressions(s) / ED Diagnoses   Final diagnoses:  None   1. Right LE pain 2. Chest wall pain  New Prescriptions New Prescriptions   No medications on file   I personally performed the services described in this documentation, which was scribed in my presence. The recorded information has been reviewed and is accurate.     Charlann Lange, PA-C 09/13/16 [place], [place], DO 09/13/16 [phone]

## 2016-09-13 ENCOUNTER — Encounter (HOSPITAL_COMMUNITY): Payer: Self-pay | Admitting: Radiology

## 2016-09-13 ENCOUNTER — Emergency Department (HOSPITAL_COMMUNITY): Payer: Medicare Other

## 2016-09-13 ENCOUNTER — Ambulatory Visit (HOSPITAL_BASED_OUTPATIENT_CLINIC_OR_DEPARTMENT_OTHER)
Admission: RE | Admit: 2016-09-13 | Discharge: 2016-09-13 | Disposition: A | Discharge status: Home / Self Care | Payer: Medicare Other | Source: Ambulatory Visit | Attending: Emergency Medicine | Admitting: Emergency Medicine

## 2016-09-13 DIAGNOSIS — R079 Chest pain, unspecified: Secondary | ICD-10-CM | POA: Diagnosis not present

## 2016-09-13 DIAGNOSIS — M79609 Pain in unspecified limb: Secondary | ICD-10-CM | POA: Diagnosis not present

## 2016-09-13 DIAGNOSIS — I2699 Other pulmonary embolism without acute cor pulmonale: Secondary | ICD-10-CM | POA: Diagnosis not present

## 2016-09-13 LAB — BASIC METABOLIC PANEL
Anion gap: 8 (ref 5–15)
BUN: 16 mg/dL (ref 6–20)
CO2: 24 mmol/L (ref 22–32)
Calcium: 9.1 mg/dL (ref 8.9–10.3)
Chloride: 105 mmol/L (ref 101–111)
Creatinine, Ser: 0.87 mg/dL (ref 0.44–1.00)
GFR calc Af Amer: >60 mL/min (ref >60)
GFR calc non Af Amer: >60 mL/min (ref >60)
Glucose, Bld: 94 mg/dL (ref 65–99)
Potassium: 4.1 mmol/L (ref 3.5–5.1)
Sodium: 137 mmol/L (ref 135–145)

## 2016-09-13 MED ORDER — ONDANSETRON HCL 4 MG/2ML IJ SOLN
4.0000 mg | Freq: Once | INTRAMUSCULAR | Status: AC
Start: 2016-09-13 — End: 2016-09-13
  Administered 2016-09-13: 4 mg via INTRAVENOUS
  Filled 2016-09-13: qty 2

## 2016-09-13 MED ORDER — MORPHINE SULFATE (PF) 4 MG/ML IV SOLN
4.0000 mg | Freq: Once | INTRAVENOUS | Status: AC
  Administered 2016-09-13: 4 mg via INTRAVENOUS
  Filled 2016-09-13: qty 1

## 2016-09-13 MED ORDER — IBUPROFEN 600 MG PO TABS: 600.0000 mg | ORAL_TABLET | Freq: Four times a day (QID) | ORAL | 0 refills | Status: DC | PRN

## 2016-09-13 MED ORDER — IOPAMIDOL (ISOVUE-370) INJECTION 76%
100.0000 mL | Freq: Once | INTRAVENOUS | Status: AC | PRN
  Administered 2016-09-13: 100 mL via INTRAVENOUS

## 2016-09-13 MED ORDER — IOPAMIDOL (ISOVUE-370) INJECTION 76%
INTRAVENOUS | Status: AC
  Administered 2016-09-13: 01:00:00
  Filled 2016-09-13: qty 100

## 2016-09-13 NOTE — ED Notes (Signed)
Patient transported to CT 

## 2016-09-13 NOTE — Discharge Instructions (Signed)
Go to [facility] as instructed later this morning for doppler study of the right leg to evaluate for enlarging clot as the source of your pain. You can fill your Percocet 7.5 mg/325 mg prescription at the appropriate time for refill.

## 2016-09-13 NOTE — Progress Notes (Signed)
*  Preliminary Results* Right lower extremity venous duplex completed. Right lower extremity is negative for deep vein thrombosis. There is no evidence of right Baker's cyst.  09/13/2016 11:00 AM  Maudry Mayhew, BS, RVT, RDCS, RDMS

## 2016-09-13 NOTE — ED Notes (Signed)
Pt instructed to go to [facility] for doppler study in the morning as laid out in the d/c papers.

## 2016-09-15 ENCOUNTER — Encounter (INDEPENDENT_AMBULATORY_CARE_PROVIDER_SITE_OTHER): Payer: Self-pay | Admitting: Orthopaedic Surgery

## 2016-09-15 ENCOUNTER — Ambulatory Visit (INDEPENDENT_AMBULATORY_CARE_PROVIDER_SITE_OTHER): Payer: Medicare Other | Admitting: Orthopaedic Surgery

## 2016-09-15 DIAGNOSIS — M5416 Radiculopathy, lumbar region: Secondary | ICD-10-CM

## 2016-09-15 MED ORDER — PREDNISONE 10 MG (21) PO TBPK: ORAL_TABLET | ORAL | 0 refills | Status: DC

## 2016-09-15 NOTE — Progress Notes (Signed)
Office Visit Note   Patient: Crystal Garza           Date of Birth: [birth year]           MRN: [id number] Visit Date: 09/15/2016              Requested by: Dickie La, MD [id number] [place], [place] [address] PCP: Dickie La, MD   Assessment & Plan: Visit Diagnoses:  1. Lumbar radiculopathy     Plan: Overall impression is lumbar radiculopathy. I did give her prednisone taper. She should keep her appointment with Dr. Ernestina Patches for epidural steroid injection. Follow up with me as needed.  Follow-Up Instructions: Return if symptoms worsen or fail to improve.   Orders:  No orders of the defined types were placed in this encounter.  Meds ordered this encounter  Medications  . predniSONE (STERAPRED UNI-PAK 21 TAB) 10 MG (21) TBPK tablet    Sig: Take as directed    Dispense:  21 tablet    Refill:  0      Procedures: No procedures performed   Clinical Data: No additional findings.   Subjective: Chief Complaint  Patient presents with  . Right Leg - Pain    Patient is a 46 year old female who comes in with right leg pain since Saturday. She endorses numbness and tingling. She went to the ER and her Doppler was negative for DVT. 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 She does get epidural steroid injections with Dr. Ernestina Patches and she scheduled for another one on Monday. She endorses a numbing sensation of her right calf.    Review of Systems  Constitutional: Negative.   HENT: Negative.   Eyes: Negative.   Respiratory: Negative.   Cardiovascular: Negative.   Endocrine: Negative.   Musculoskeletal: Negative.   Neurological: Negative.   Hematological: Negative.   Psychiatric/Behavioral: Negative.   All other systems reviewed and are negative.    Objective: Vital Signs: There were no vitals taken for this visit.  Physical Exam  Constitutional: She is oriented to person, place, and time. She appears well-developed and well-nourished.    Pulmonary/Chest: Effort normal.  Neurological: She is alert and oriented to person, place, and time.  Skin: Skin is warm. Capillary refill takes less than 2 seconds.  Psychiatric: She has a normal mood and affect. Her behavior is normal. Judgment and thought content normal.  Nursing note and vitals reviewed.   Ortho Exam Patient is exam is nonfocal. Her hip is nontender with good range of motion. She endorses Gist subjective numbness in the lower leg. She has no focal deficits on exam. Specialty Comments:  No specialty comments available.  Imaging: No results found.   PMFS History: Patient Active Problem List   Diagnosis Date Noted  . Esophageal dysphagia   . Nausea without vomiting   . Family history of colon cancer   . Dizziness 11/08/2015  . 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 Chronic neck and back pain 02/27/2015  . Other chest pain 12/10/2014  . Thrombocytosis ([place]) 08/29/2014  . Medication management 08/22/2014  . Hematuria 08/22/2014  . Pain in joint, ankle and foot 05/23/2014  . Chronic anticoagulation 05/17/2014  . Chronic thoracic back pain 04/11/2014  . Right flank pain 01/17/2014  . Degenerative arthritis of left knee 10/06/2013  . Status post total knee replacement 10/06/2013  . Edema 07/28/2013  . Chronic pain 06/13/2013  . 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 Bipolar disorder ([place]) 01/19/2012  . Nephrolithiasis 02/20/2011  . Eczematous dermatitis 10/22/2010  . Iron deficiency anemia 06/27/2010  . Anxiety 06/27/2010  . Migraine 09/12/2009  . Morbid obesity-BMI 62 10/30/2008  . Osteoarthrosis, unspecified whether generalized or localized, involving lower leg 07/05/2007  . DEPRESSION, MAJOR, RECURRENT 07/01/2006  . HYPERTENSION, BENIGN SYSTEMIC 07/01/2006  . RHINITIS, ALLERGIC 07/01/2006  . Gastroesophageal reflux disease 07/01/2006   Past Medical History:  Diagnosis Date  . Anemia     iron def  . Anginal pain ([place])   . Anxiety   . Arthritis    left knee   . Back pain   . Benign gastrointestinal stromal tumor (GIST)   . Chronic knee pain   . Depression   . DVT (deep venous thrombosis) ([place])   . GERD (gastroesophageal reflux disease)   . Hx of laparoscopic gastric banding   . Hypertension   . Kidney stones   . Migraine   . Morbid obesity ([place])   . PE (pulmonary embolism)     Family History  Problem Relation Age of Onset  . Diabetes Mother   . Diabetes Father   . Heart disease Father   . Colon cancer Father        brain tumor  . Colon polyps Father   . Colitis Father   . Irritable bowel syndrome Father   . Diabetes Maternal Grandmother   . Diabetes Maternal Grandfather   . Schizophrenia Maternal Grandfather   . Heart disease Paternal Uncle   . Diabetes Paternal Uncle   . Heart disease Paternal Grandmother   . Heart disease Paternal Grandfather   . Diabetes Maternal Aunt   . Diabetes Maternal Uncle   . Diabetes Paternal Aunt     Past Surgical History:  Procedure Laterality Date  . [place] STUDY N/A 05/13/2016   Procedure: [place] STUDY;  Surgeon: Manus Gunning, MD;  [facility];  Service: Gastroenterology;  Laterality: N/A;  . ABDOMINAL HYSTERECTOMY    . BIOPSY  02/16/2012   Procedure: BIOPSY;  Surgeon: [name], MD;  [facility]: [facility];  Service: General;;  biopsy of mass x 2  . BREATH TEK H PYLORI  07/28/2011   Procedure: [place];  Surgeon: [name], MD;  Location: [facility];  Service: General;  Laterality: N/A;  to be done at 745  . CESAREAN SECTION  05/1991  . COLONOSCOPY WITH PROPOFOL N/A 03/03/2016   Procedure: COLONOSCOPY WITH PROPOFOL;  Surgeon: Manus Gunning, MD;  [facility];  Service: Gastroenterology;  Laterality: N/A;  . ESOPHAGEAL MANOMETRY N/A 05/13/2016   Procedure: ESOPHAGEAL MANOMETRY (EM);  Surgeon: Manus Gunning, MD;  [facility];   Service: Gastroenterology;  Laterality: N/A;  . ESOPHAGOGASTRODUODENOSCOPY N/A 08/29/2012   Procedure: ESOPHAGOGASTRODUODENOSCOPY (EGD);  Surgeon: [name], MD;  [facility];  Service: General;  Laterality: N/A;  . ESOPHAGOGASTRODUODENOSCOPY N/A 09/04/2014   Procedure: ESOPHAGOGASTRODUODENOSCOPY (EGD);  Surgeon: Alphonsa Overall, MD;  Location: [facility];  Service: General;  Laterality: N/A;  . ESOPHAGOGASTRODUODENOSCOPY (EGD) WITH PROPOFOL N/A 03/03/2016   Procedure: ESOPHAGOGASTRODUODENOSCOPY (EGD) WITH PROPOFOL;  Surgeon: Manus Gunning, MD;  [facility];  Service: Gastroenterology;  Laterality: N/A;  . EUS  03/03/2012   Procedure: UPPER ENDOSCOPIC ULTRASOUND (EUS) LINEAR;  Surgeon: Milus Banister, MD;  [facility];  Service: Endoscopy;  Laterality: N/A;  . gist    . KNEE ARTHROSCOPY    .  LAPAROSCOPIC GASTRECTOMY  04/08/2012   Procedure: LAPAROSCOPIC GASTRECTOMY;  Surgeon: [name], MD;  [facility];  Service: General;;  removal of GIST tumor of stomach  . LAPAROSCOPIC GASTRIC SLEEVE RESECTION N/A 08/29/2012   Procedure: LAPAROSCOPIC GASTRIC SLEEVE RESECTION;  Surgeon: [name], MD;  [facility];  Service: General;  Laterality: N/A;  Sleeve Gastrectomy  . TOTAL KNEE ARTHROPLASTY Left 10/06/2013   Procedure: LEFT TOTAL KNEE ARTHROPLASTY;  Surgeon: Mcarthur Rossetti, MD;  [facility];  Service: Orthopedics;  Laterality: Left;  . TUBAL LIGATION     Social History   Occupational History  . Not on file.   Social History Main Topics  . Smoking status: Never Smoker  . Smokeless tobacco: Never Used  . Alcohol use 0.0 oz/week     Comment: maybe once a month  . Drug use: No  . Sexual activity: Not on file

## 2016-09-19 ENCOUNTER — Ambulatory Visit
Admission: RE | Admit: 2016-09-19 | Discharge: 2016-09-19 | Disposition: A | Discharge status: Home / Self Care | Payer: Medicare Other | Source: Ambulatory Visit | Attending: Podiatry | Admitting: Podiatry

## 2016-09-19 ENCOUNTER — Other Ambulatory Visit: Payer: Self-pay

## 2016-09-19 DIAGNOSIS — M79672 Pain in left foot: Secondary | ICD-10-CM | POA: Diagnosis not present

## 2016-09-19 DIAGNOSIS — M76822 Posterior tibial tendinitis, left leg: Secondary | ICD-10-CM | POA: Diagnosis not present

## 2016-09-21 ENCOUNTER — Encounter (INDEPENDENT_AMBULATORY_CARE_PROVIDER_SITE_OTHER): Payer: Self-pay | Admitting: Physical Medicine and Rehabilitation

## 2016-09-21 ENCOUNTER — Ambulatory Visit (INDEPENDENT_AMBULATORY_CARE_PROVIDER_SITE_OTHER): Payer: Medicare Other | Admitting: Physical Medicine and Rehabilitation

## 2016-09-21 ENCOUNTER — Ambulatory Visit (INDEPENDENT_AMBULATORY_CARE_PROVIDER_SITE_OTHER): Payer: Medicare Other

## 2016-09-21 DIAGNOSIS — G894 Chronic pain syndrome: Secondary | ICD-10-CM

## 2016-09-21 DIAGNOSIS — M47816 Spondylosis without myelopathy or radiculopathy, lumbar region: Secondary | ICD-10-CM | POA: Diagnosis not present

## 2016-09-21 MED ORDER — LIDOCAINE HCL (PF) 1 % IJ SOLN
2.0000 mL | Freq: Once | INTRAMUSCULAR | Status: AC
  Administered 2016-09-22: 2 mL

## 2016-09-21 MED ORDER — METHYLPREDNISOLONE ACETATE 80 MG/ML IJ SUSP
80.0000 mg | Freq: Once | INTRAMUSCULAR | Status: AC
  Administered 2016-09-22: 80 mg

## 2016-09-21 NOTE — Patient Instructions (Signed)

## 2016-09-21 NOTE — Procedures (Signed)
Lumbar Facet Joint Intra-Articular Injection(s) with Fluoroscopic Guidance  Patient: Crystal Garza      Date of Birth: May 24, 1970 MRN: [id number] PCP: Dickie La, MD      Visit Date: 09/21/2016   Universal Protocol:    Date/Time: 05/21/181:56 PM  Consent Given By: the patient  Position: PRONE   Additional Comments: Vital signs were monitored before and after the procedure. Patient was prepped and draped in the usual sterile fashion. The correct patient, procedure, and site was verified.   Injection Procedure Details:  Procedure Site One Meds Administered:  Meds ordered this encounter  Medications  . lidocaine (PF) (XYLOCAINE) 1 % injection 2 mL  . methylPREDNISolone acetate (DEPO-MEDROL) injection 80 mg     Laterality: Bilateral  Location/Site:  L4-L5  Needle size: 22 guage  Needle type: Spinal  Needle Placement: Articular  Findings:  -Contrast Used: 1 mL iohexol 180 mg iodine/mL   -Comments: Excellent flow of contrast producing a partial arthrogram.  Procedure Details: The fluoroscope beam is vertically oriented in AP, and the inferior recess is visualized beneath the lower pole of the inferior apophyseal process, which represents the target point for needle insertion. When direct visualization is difficult the target point is located at the medial projection of the vertebral pedicle. The region overlying each aforementioned target is locally anesthetized with a 1 to 2 ml. volume of 1% Lidocaine without Epinephrine.   The spinal needle was inserted into each of the above mentioned facet joints using biplanar fluoroscopic guidance. A 0.25 to 0.5 ml. volume of Isovue-250 was injected and a partial facet joint arthrogram was obtained. A single spot film was obtained of the resulting arthrogram.    One to 1.25 ml of the steroid/anesthetic solution was then injected into each of the facet joints noted above.   Additional Comments:  The patient tolerated the procedure  well Dressing: Band-Aid    Post-procedure details: Patient was observed during the procedure. Post-procedure instructions were reviewed.  Patient left the clinic in stable condition.

## 2016-09-21 NOTE — Progress Notes (Deleted)
Increased back pain since February. Pain across lower back. Right side is worse. Constant pain. Worse with laying down. Pain from knee to foot but she hurt foot a couple of weeks ago and not sure if the leg pain is just coming from where she hurt her foot. Saw Dr. Erlinda Hong for this.

## 2016-09-21 NOTE — Progress Notes (Signed)
SAQUOIA SIANEZ - 46 y.o. female MRN [id number]  Date of birth: 03/02/1971  Office Visit Note: Visit Date: 09/21/2016 PCP: Dickie La, MD Referred by: Dickie La, MD  Subjective: Chief Complaint  Patient presents with  . Lower Back - Pain   HPI: Ms. Rayann Heman is a 46 year old female that I seen in the past for chronic back pain. She has known facet arthropathy at L4-5 with a small listhesis but no nerve compression or stenosis. She has a convoluted history of leg and foot pain recently where she was having some issues with her feet and seeing Dr. Earleen Newport a podiatrist at [facility]. Then sometime in late April she actually twisted her ankle is having issues and saw them as an urgent work in. In the interim she did have MRIs performed of her left foot as it was giving her issues as well. She had of seeing Dr. Erlinda Hong in our office who felt like she was having radicular pain and thought she had epidural injections in the past and severity new that she was scheduled to see me anyway. Unfortunately we have not been doing epidurals but had been doing facet joint blocks for her facet joint arthritis. She has 2 issues she does have concordant low back pain which is axial worse with standing. She is morbidly obese and we cannot do radiofrequency ablation procedure for her lumbar spine. She is having mostly axial low back pain and bilateral. She does not have pain down the legs but has right foot pain and calf pain. She had a recent screening for DVT which was negative. She is given a follow-up with the podiatrist.    Review of Systems  Constitutional: Negative for chills, fever, malaise/fatigue and weight loss.  HENT: Negative for hearing loss and sinus pain.   Eyes: Negative for blurred vision, double vision and photophobia.  Respiratory: Negative for cough and shortness of breath.   Cardiovascular: Negative for chest pain, palpitations and leg swelling.  Gastrointestinal: Negative for abdominal  pain, nausea and vomiting.  Genitourinary: Negative for flank pain.  Musculoskeletal: Positive for back pain and joint pain. Negative for myalgias.       Foot pain  Skin: Negative for itching and rash.  Neurological: Negative for tremors, focal weakness and weakness.  Endo/Heme/Allergies: Negative.   Psychiatric/Behavioral: Negative for depression.  All other systems reviewed and are negative.  Otherwise per HPI.  Assessment & Plan: Visit Diagnoses:  1. Spondylosis without myelopathy or radiculopathy, lumbar region   2. Chronic pain syndrome     Plan: Findings:  1. Chronic worsening low back pain with history of spondylosis and spondylolisthesis which is mild. She has no stenosis. She is relatively new MRI. She is morbidly obese and we talked about weight loss with her. Prior injections were facet joint blocks usually bilateral but sometimes more one-sided. They have helped her for many months at a time and do seem to work. We will repeat 1 today which reveal a bilateral L4-5 facet joint block. Unfortunately her body habitus we cannot perform radiofrequency ablation.  2. For her foot pain she is in a follow-up with the podiatrist at [facility]. There are review the MRI and I'm sure complete the workup. Alternatively for her hip problems if they exist she could see Dr. Erlinda Hong in the office for follow-up but I think right now her low back is the biggest issue.    Meds & Orders:  Meds ordered this encounter  Medications  . lidocaine (PF) (XYLOCAINE) 1 % injection 2 mL  . methylPREDNISolone acetate (DEPO-MEDROL) injection 80 mg    Orders Placed This Encounter  Procedures  . Facet Injection  . XR C-ARM NO REPORT    Follow-up: Return if symptoms worsen or fail to improve.   Procedures: No procedures performed  Lumbar Facet Joint Intra-Articular Injection(s) with Fluoroscopic Guidance  Patient: ALEXIS REBER      Date of Birth: Feb 15, 1971 MRN: [id number] PCP: Dickie La,  MD      Visit Date: 09/21/2016   Universal Protocol:    Date/Time: 05/21/181:56 PM  Consent Given By: the patient  Position: PRONE   Additional Comments: Vital signs were monitored before and after the procedure. Patient was prepped and draped in the usual sterile fashion. The correct patient, procedure, and site was verified.   Injection Procedure Details:  Procedure Site One Meds Administered:  Meds ordered this encounter  Medications  . lidocaine (PF) (XYLOCAINE) 1 % injection 2 mL  . methylPREDNISolone acetate (DEPO-MEDROL) injection 80 mg     Laterality: Bilateral  Location/Site:  L4-L5  Needle size: 22 guage  Needle type: Spinal  Needle Placement: Articular  Findings:  -Contrast Used: 1 mL iohexol 180 mg iodine/mL   -Comments: Excellent flow of contrast producing a partial arthrogram.  Procedure Details: The fluoroscope beam is vertically oriented in AP, and the inferior recess is visualized beneath the lower pole of the inferior apophyseal process, which represents the target point for needle insertion. When direct visualization is difficult the target point is located at the medial projection of the vertebral pedicle. The region overlying each aforementioned target is locally anesthetized with a 1 to 2 ml. volume of 1% Lidocaine without Epinephrine.   The spinal needle was inserted into each of the above mentioned facet joints using biplanar fluoroscopic guidance. A 0.25 to 0.5 ml. volume of Isovue-250 was injected and a partial facet joint arthrogram was obtained. A single spot film was obtained of the resulting arthrogram.    One to 1.25 ml of the steroid/anesthetic solution was then injected into each of the facet joints noted above.   Additional Comments:  The patient tolerated the procedure well Dressing: Band-Aid    Post-procedure details: Patient was observed during the procedure. Post-procedure instructions were reviewed.  Patient left the clinic  in stable condition.     Clinical History: No specialty comments available.  She reports that she has never smoked. She has never used smokeless tobacco. No results for input(s): HGBA1C, LABURIC in the last 8760 hours.  Objective:  VS:  HT:    WT:   BMI:     BP:   HR: bpm  TEMP: ( )  RESP:  Physical Exam  Ortho Exam Imaging: Xr C-arm No Report  Result Date: 09/21/2016 Please see Notes or Procedures tab for imaging impression.   Past Medical/Family/Surgical/Social History: Medications & Allergies reviewed per EMR Patient Active Problem List   Diagnosis Date Noted  . Esophageal dysphagia   . Nausea without vomiting   . Family history of colon cancer   . Dizziness 11/08/2015  . 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 Chronic neck and back pain 02/27/2015  . Other chest pain 12/10/2014  . Thrombocytosis ([place]) 08/29/2014  . Medication management 08/22/2014  . Hematuria 08/22/2014  . Pain in joint, ankle and foot 05/23/2014  . Chronic anticoagulation 05/17/2014  . Chronic thoracic back pain 04/11/2014  . Right flank pain 01/17/2014  . Degenerative arthritis of left  knee 10/06/2013  . Status post total knee replacement 10/06/2013  . Edema 07/28/2013  . Chronic pain 06/13/2013  . 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 Gastrointestinal stromal tumor (GIST) of stomach ([place]) 02/17/2012  . Bipolar disorder ([place]) 01/19/2012  . Nephrolithiasis 02/20/2011  . Eczematous dermatitis 10/22/2010  . Iron deficiency anemia 06/27/2010  . Anxiety 06/27/2010  . Migraine 09/12/2009  . Morbid obesity-BMI 62 10/30/2008  . Osteoarthrosis, unspecified whether generalized or localized, involving lower leg 07/05/2007  . DEPRESSION, MAJOR, RECURRENT 07/01/2006  . HYPERTENSION, BENIGN SYSTEMIC 07/01/2006  . RHINITIS, ALLERGIC 07/01/2006  . Gastroesophageal reflux disease 07/01/2006   Past Medical History:  Diagnosis Date  . Anemia    iron def  . Anginal pain ([place])   . Anxiety   . Arthritis     left knee   . Back pain   . Benign gastrointestinal stromal tumor (GIST)   . Chronic knee pain   . Depression   . DVT (deep venous thrombosis) ([place])   . GERD (gastroesophageal reflux disease)   . Hx of laparoscopic gastric banding   . Hypertension   . Kidney stones   . Migraine   . Morbid obesity ([place])   . PE (pulmonary embolism)    Family History  Problem Relation Age of Onset  . Diabetes Mother   . Diabetes Father   . Heart disease Father   . Colon cancer Father        brain tumor  . Colon polyps Father   . Colitis Father   . Irritable bowel syndrome Father   . Diabetes Maternal Grandmother   . Diabetes Maternal Grandfather   . Schizophrenia Maternal Grandfather   . Heart disease Paternal Uncle   . Diabetes Paternal Uncle   . Heart disease Paternal Grandmother   . Heart disease Paternal Grandfather   . Diabetes Maternal Aunt   . Diabetes Maternal Uncle   . Diabetes Paternal Aunt    Past Surgical History:  Procedure Laterality Date  . [place] STUDY N/A 05/13/2016   Procedure: [place] STUDY;  Surgeon: Manus Gunning, MD;  [facility];  Service: Gastroenterology;  Laterality: N/A;  . ABDOMINAL HYSTERECTOMY    . BIOPSY  02/16/2012   Procedure: BIOPSY;  Surgeon: [name], MD;  [facility];  Service: General;;  biopsy of mass x 2  . BREATH TEK H PYLORI  07/28/2011   Procedure: [place];  Surgeon: [name], MD;  Location: [facility];  Service: General;  Laterality: N/A;  to be done at 745  . CESAREAN SECTION  05/1991  . COLONOSCOPY WITH PROPOFOL N/A 03/03/2016   Procedure: COLONOSCOPY WITH PROPOFOL;  Surgeon: Manus Gunning, MD;  [facility];  Service: Gastroenterology;  Laterality: N/A;  . ESOPHAGEAL MANOMETRY N/A 05/13/2016   Procedure: ESOPHAGEAL MANOMETRY (EM);  Surgeon: Manus Gunning, MD;  [facility];  Service: Gastroenterology;  Laterality: N/A;  .  ESOPHAGOGASTRODUODENOSCOPY N/A 08/29/2012   Procedure: ESOPHAGOGASTRODUODENOSCOPY (EGD);  Surgeon: [name], MD;  [facility];  Service: General;  Laterality: N/A;  . ESOPHAGOGASTRODUODENOSCOPY N/A 09/04/2014   Procedure: ESOPHAGOGASTRODUODENOSCOPY (EGD);  Surgeon: Alphonsa Overall, MD;  Location: [facility];  Service: General;  Laterality: N/A;  . ESOPHAGOGASTRODUODENOSCOPY (EGD) WITH PROPOFOL N/A 03/03/2016   Procedure: ESOPHAGOGASTRODUODENOSCOPY (EGD) WITH PROPOFOL;  Surgeon: Manus Gunning, MD;  [facility];  Service: Gastroenterology;  Laterality: N/A;  . EUS  03/03/2012   Procedure:  UPPER ENDOSCOPIC ULTRASOUND (EUS) LINEAR;  Surgeon: Milus Banister, MD;  [facility];  Service: Endoscopy;  Laterality: N/A;  . gist    . KNEE ARTHROSCOPY    . LAPAROSCOPIC GASTRECTOMY  04/08/2012   Procedure: LAPAROSCOPIC GASTRECTOMY;  Surgeon: [name], MD;  [facility];  Service: General;;  removal of GIST tumor of stomach  . LAPAROSCOPIC GASTRIC SLEEVE RESECTION N/A 08/29/2012   Procedure: LAPAROSCOPIC GASTRIC SLEEVE RESECTION;  Surgeon: [name], MD;  [facility];  Service: General;  Laterality: N/A;  Sleeve Gastrectomy  . TOTAL KNEE ARTHROPLASTY Left 10/06/2013   Procedure: LEFT TOTAL KNEE ARTHROPLASTY;  Surgeon: Mcarthur Rossetti, MD;  [facility];  Service: Orthopedics;  Laterality: Left;  . TUBAL LIGATION     Social History   Occupational History  . Not on file.   Social History Main Topics  . Smoking status: Never Smoker  . Smokeless tobacco: Never Used  . Alcohol use 0.0 oz/week     Comment: maybe once a month  . Drug use: No  . Sexual activity: Not on file

## 2016-09-22 DIAGNOSIS — G894 Chronic pain syndrome: Secondary | ICD-10-CM | POA: Diagnosis not present

## 2016-09-22 DIAGNOSIS — M47816 Spondylosis without myelopathy or radiculopathy, lumbar region: Secondary | ICD-10-CM | POA: Diagnosis not present

## 2016-09-30 ENCOUNTER — Ambulatory Visit (INDEPENDENT_AMBULATORY_CARE_PROVIDER_SITE_OTHER): Payer: Medicare Other | Admitting: Family Medicine

## 2016-09-30 ENCOUNTER — Encounter: Payer: Self-pay | Admitting: Family Medicine

## 2016-09-30 DIAGNOSIS — G894 Chronic pain syndrome: Secondary | ICD-10-CM | POA: Diagnosis not present

## 2016-09-30 MED ORDER — OXYCODONE-ACETAMINOPHEN 7.5-325 MG PO TABS: ORAL_TABLET | ORAL | 0 refills | Status: DC

## 2016-10-01 ENCOUNTER — Ambulatory Visit: Payer: Medicare Other | Admitting: Podiatry

## 2016-10-01 NOTE — Assessment & Plan Note (Signed)
Greater than 50% of our 45 minute office visit was spent in counseling and education regarding the issues of her chronic knee pain, relatively new issues with radicular back pain, recent worsening of bilateral foot pain. Reviewed her imaging. Weight management is going to be key. We discussed [employer] etc.

## 2016-10-01 NOTE — Progress Notes (Signed)
    CHIEF COMPLAINT / HPI:   back pain radiating to legs. New. Started over last two -3 weeks--worse on left. Has seen her ortho dr and had facet injection---helped 50%. Left foot still numb.  Knee pain is actually worse as she has gained some weight. Lots of problems w left foot and ankle  Pain taht is aching and burning.Has questions about pain mgt.  REVIEW OF SYSTEMS:  Pertinent review of systems: negative for fever or unusual weight change.   OBJECTIVE:  Vital signs are reviewed.   WD WN NAD Gait is normal Psych: AxOx4.  IMAGING. Reviewed recent MRI of foot and ankle 2016 MRI LS spine  ASSESSMENT / PLAN: Please see problem oriented charting for details

## 2016-10-05 ENCOUNTER — Encounter (HOSPITAL_COMMUNITY): Payer: Self-pay

## 2016-10-05 ENCOUNTER — Emergency Department (HOSPITAL_COMMUNITY): Payer: Medicare Other

## 2016-10-05 ENCOUNTER — Other Ambulatory Visit: Payer: Self-pay

## 2016-10-05 ENCOUNTER — Emergency Department (HOSPITAL_COMMUNITY)
Admission: EM | Admit: 2016-10-05 | Discharge: 2016-10-05 | Disposition: A | Discharge status: Home / Self Care | Payer: Medicare Other | Attending: Emergency Medicine | Admitting: Emergency Medicine

## 2016-10-05 DIAGNOSIS — R079 Chest pain, unspecified: Secondary | ICD-10-CM | POA: Diagnosis not present

## 2016-10-05 DIAGNOSIS — T781XXA Other adverse food reactions, not elsewhere classified, initial encounter: Secondary | ICD-10-CM | POA: Insufficient documentation

## 2016-10-05 DIAGNOSIS — Z79899 Other long term (current) drug therapy: Secondary | ICD-10-CM | POA: Diagnosis not present

## 2016-10-05 DIAGNOSIS — Z7901 Long term (current) use of anticoagulants: Secondary | ICD-10-CM | POA: Insufficient documentation

## 2016-10-05 DIAGNOSIS — R0602 Shortness of breath: Secondary | ICD-10-CM | POA: Diagnosis not present

## 2016-10-05 DIAGNOSIS — T7840XA Allergy, unspecified, initial encounter: Secondary | ICD-10-CM | POA: Diagnosis not present

## 2016-10-05 DIAGNOSIS — I10 Essential (primary) hypertension: Secondary | ICD-10-CM | POA: Insufficient documentation

## 2016-10-05 DIAGNOSIS — Z96652 Presence of left artificial knee joint: Secondary | ICD-10-CM | POA: Insufficient documentation

## 2016-10-05 LAB — BASIC METABOLIC PANEL
Anion gap: 11 (ref 5–15)
BUN: 11 mg/dL (ref 6–20)
CO2: 22 mmol/L (ref 22–32)
Calcium: 9.2 mg/dL (ref 8.9–10.3)
Chloride: 104 mmol/L (ref 101–111)
Creatinine, Ser: 0.98 mg/dL (ref 0.44–1.00)
GFR calc Af Amer: >60 mL/min (ref >60)
GFR calc non Af Amer: >60 mL/min (ref >60)
Glucose, Bld: 86 mg/dL (ref 65–99)
Potassium: 4.2 mmol/L (ref 3.5–5.1)
Sodium: 137 mmol/L (ref 135–145)

## 2016-10-05 LAB — CBC
HCT: 39.8 % (ref 36.0–46.0)
Hemoglobin: 12.9 g/dL (ref 12.0–15.0)
MCH: 27.2 pg (ref 26.0–34.0)
MCHC: 32.4 g/dL (ref 30.0–36.0)
MCV: 84 fL (ref 78.0–100.0)
Platelets: 521 10*3/uL — ABNORMAL HIGH (ref 150–400)
RBC: 4.74 MIL/uL (ref 3.87–5.11)
RDW: 15 % (ref 11.5–15.5)
WBC: 14.6 10*3/uL — ABNORMAL HIGH (ref 4.0–10.5)

## 2016-10-05 LAB — I-STAT TROPONIN, ED: Troponin i, poc: 0 ng/mL (ref 0.00–0.08)

## 2016-10-05 MED ORDER — PREDNISONE 20 MG PO TABS: 40.0000 mg | ORAL_TABLET | Freq: Every day | ORAL | 0 refills | Status: DC

## 2016-10-05 MED ORDER — DIPHENHYDRAMINE HCL 50 MG/ML IJ SOLN
25.0000 mg | Freq: Once | INTRAMUSCULAR | Status: AC
  Administered 2016-10-05: 25 mg via INTRAVENOUS
  Filled 2016-10-05: qty 1

## 2016-10-05 MED ORDER — ONDANSETRON HCL 4 MG/2ML IJ SOLN
4.0000 mg | Freq: Once | INTRAMUSCULAR | Status: AC
  Administered 2016-10-05: 4 mg via INTRAVENOUS
  Filled 2016-10-05: qty 2

## 2016-10-05 MED ORDER — PREDNISONE 20 MG PO TABS
40.0000 mg | ORAL_TABLET | Freq: Once | ORAL | Status: AC
  Administered 2016-10-05: 40 mg via ORAL
  Filled 2016-10-05: qty 2

## 2016-10-05 MED ORDER — FAMOTIDINE IN NACL 20-0.9 MG/50ML-% IV SOLN
20.0000 mg | Freq: Once | INTRAVENOUS | Status: AC
  Administered 2016-10-05: 20 mg via INTRAVENOUS
  Filled 2016-10-05: qty 50

## 2016-10-05 NOTE — ED Triage Notes (Signed)
Pt reports eating tuna and having onset of itching and shortness of breath. She reports she then began having central, non radiating chest pain. Some redness noted bilaterally to breasts from scratching. Lung sounds clear on auscultation, airway intact.

## 2016-10-05 NOTE — ED Provider Notes (Signed)
[facility] DEPT Provider Note   CSN: [id number] Arrival date & time: 10/05/16  1648     History   Chief Complaint Chief Complaint  Patient presents with  . Shortness of Breath  . Chest Pain    HPI Crystal Garza is a 46 y.o. female.  HPI Patient presents with itching and shortness of breath. Around 1:30 PM today she eats some tuna and a Half hour later she began having itching. She has had 2 in the past without any issues. States she also had some crackers with. Since then she takes some Benadryl without relief. It is somewhat diffuse. States he now has tightening in her throat. No fevers. Slight trouble breathing. No history of allergies. No fevers or chills. Past Medical History:  Diagnosis Date  . Anemia    iron def  . Anginal pain ([place])   . Anxiety   . Arthritis    left knee   . Back pain   . Benign gastrointestinal stromal tumor (GIST)   . Chronic knee pain   . Depression   . DVT (deep venous thrombosis) ([place])   . GERD (gastroesophageal reflux disease)   . Hx of laparoscopic gastric banding   . Hypertension   . Kidney stones   . Migraine   . Morbid obesity ([place])   . PE (pulmonary embolism)     Patient Active Problem List   Diagnosis Date Noted  . Esophageal dysphagia   . Nausea without vomiting   . Family history of colon cancer   . Dizziness 11/08/2015  . 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 Chronic neck and back pain 02/27/2015  . Other chest pain 12/10/2014  . Thrombocytosis ([place]) 08/29/2014  . Medication management 08/22/2014  . Hematuria 08/22/2014  . Pain in joint, ankle and foot 05/23/2014  . Chronic anticoagulation 05/17/2014  . Chronic thoracic back pain 04/11/2014  . Right flank pain 01/17/2014  . Degenerative arthritis of left knee 10/06/2013  . Status post total knee replacement 10/06/2013  . Edema 07/28/2013  . Chronic pain 06/13/2013  . 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 Bipolar disorder ([place]) 01/19/2012  . Nephrolithiasis 02/20/2011  . Eczematous dermatitis 10/22/2010  . Iron deficiency anemia 06/27/2010  . Anxiety 06/27/2010  . Migraine 09/12/2009  . Morbid obesity-BMI 62 10/30/2008  . Osteoarthrosis, unspecified whether generalized or localized, involving lower leg 07/05/2007  . DEPRESSION, MAJOR, RECURRENT 07/01/2006  . HYPERTENSION, BENIGN SYSTEMIC 07/01/2006  . RHINITIS, ALLERGIC 07/01/2006  . Gastroesophageal reflux disease 07/01/2006    Past Surgical History:  Procedure Laterality Date  . [place] STUDY N/A 05/13/2016   Procedure: [place] STUDY;  Surgeon: Manus Gunning, MD;  Location: [facility];  Service: Gastroenterology;  Laterality: N/A;  . ABDOMINAL HYSTERECTOMY    . BIOPSY  02/16/2012   Procedure: BIOPSY;  Surgeon: [name], MD;  Location: [facility];  Service: General;;  biopsy of mass x 2  . BREATH TEK H PYLORI  07/28/2011   Procedure: [place];  Surgeon: [name], MD;  Location: [facility];  Service: General;  Laterality: N/A;  to be done at 745  . CESAREAN SECTION  05/1991  . COLONOSCOPY WITH PROPOFOL N/A 03/03/2016   Procedure: COLONOSCOPY WITH PROPOFOL;  Surgeon: Manus Gunning, MD;  Location: [facility];  Service: Gastroenterology;  Laterality: N/A;  . ESOPHAGEAL MANOMETRY N/A 05/13/2016   Procedure: ESOPHAGEAL MANOMETRY (EM);  Surgeon: Manus Gunning, MD;  Location: [facility];  Service: Gastroenterology;  Laterality: N/A;  . ESOPHAGOGASTRODUODENOSCOPY N/A 08/29/2012   Procedure: ESOPHAGOGASTRODUODENOSCOPY (EGD);  Surgeon: [name], MD;  Location: [facility];  Service: General;  Laterality: N/A;  . ESOPHAGOGASTRODUODENOSCOPY N/A 09/04/2014   Procedure: ESOPHAGOGASTRODUODENOSCOPY (EGD);  Surgeon: Alphonsa Overall, MD;  Location: [facility];  Service: General;  Laterality: N/A;  . ESOPHAGOGASTRODUODENOSCOPY (EGD) WITH PROPOFOL N/A 03/03/2016   Procedure:  ESOPHAGOGASTRODUODENOSCOPY (EGD) WITH PROPOFOL;  Surgeon: Manus Gunning, MD;  Location: [facility];  Service: Gastroenterology;  Laterality: N/A;  . EUS  03/03/2012   Procedure: UPPER ENDOSCOPIC ULTRASOUND (EUS) LINEAR;  Surgeon: Milus Banister, MD;  Location: [facility];  Service: Endoscopy;  Laterality: N/A;  . gist    . KNEE ARTHROSCOPY    . LAPAROSCOPIC GASTRECTOMY  04/08/2012   Procedure: LAPAROSCOPIC GASTRECTOMY;  Surgeon: [name], MD;  Location: [facility];  Service: General;;  removal of GIST tumor of stomach  . LAPAROSCOPIC GASTRIC SLEEVE RESECTION N/A 08/29/2012   Procedure: LAPAROSCOPIC GASTRIC SLEEVE RESECTION;  Surgeon: [name], MD;  Location: [facility];  Service: General;  Laterality: N/A;  Sleeve Gastrectomy  . TOTAL KNEE ARTHROPLASTY Left 10/06/2013   Procedure: LEFT TOTAL KNEE ARTHROPLASTY;  Surgeon: Mcarthur Rossetti, MD;  Location: [facility];  Service: Orthopedics;  Laterality: Left;  . TUBAL LIGATION      OB History    No data available       Home Medications    Prior to Admission medications   Medication Sig Start Date End Date Taking? Authorizing Provider  dexlansoprazole (DEXILANT) 60 MG capsule Take 1 capsule (60 mg total) by mouth daily. 08/05/16 07/31/17 Yes Dickie La, MD  hydrOXYzine (VISTARIL) 25 MG capsule Take 1 capsule (25 mg total) by mouth at bedtime. 08/07/15  Yes Arfeen, Arlyce Harman, MD  ibuprofen (ADVIL,MOTRIN) 600 MG tablet Take 1 tablet (600 mg total) by mouth every 6 (six) hours as needed. 09/13/16  Yes Charlann Lange, PA-C  lamoTRIgine (LAMICTAL) 200 MG tablet Take 1 tablet (200 mg total) by mouth at bedtime. 08/05/16  Yes Dickie La, MD  lisinopril (PRINIVIL,ZESTRIL) 10 MG tablet Take 1 tablet (10 mg total) by mouth daily. 04/15/16  Yes Dickie La, MD  meclizine (ANTIVERT) 32 MG tablet Take 1 tablet (32 mg total) by mouth 3 (three) times daily as needed. 11/08/15  Yes Nicolette Bang, DO  mupirocin ointment (BACTROBAN) 2  % Apply t o affected skin area twice daily as directed 02/19/16  Yes Dickie La, MD  nystatin cream (MYCOSTATIN) APPLY TOPICALLY 2 TIMES A DAY Patient taking differently: Apply 1 application topically 2 (two) times daily as needed for dry skin.  08/05/16  Yes Dickie La, MD  ondansetron (ZOFRAN-ODT) 4 MG disintegrating tablet Take 4 mg by mouth every 8 (eight) hours as needed for nausea.  01/23/16  Yes [provider]  oxyCODONE-acetaminophen (PERCOCET) 7.5-325 MG tablet Take one tablet by mouth every 8 hours as needed for pain do not fill before November 03 2016 09/30/16  Yes Dickie La, MD  risperiDONE (RISPERDAL) 1 MG tablet Take 1 tablet (1 mg total) by mouth at bedtime. 08/05/16  Yes Dickie La, MD  rivaroxaban (XARELTO) 20 MG TABS tablet Take 1 tablet (20 mg total) by mouth daily with supper. 08/05/16  Yes Dickie La, MD  tiZANidine (ZANAFLEX) 4 MG tablet Take 1 tablet (4 mg total) by mouth 2 (two)  times daily as needed for muscle spasms. 08/05/16  Yes Dickie La, MD  VESICARE 5 MG tablet Take 5 mg by mouth daily.  10/03/14  Yes [provider]  predniSONE (DELTASONE) 20 MG tablet Take 2 tablets (40 mg total) by mouth daily. 10/06/16   Davonna Belling, MD    Family History Family History  Problem Relation Age of Onset  . Diabetes Mother   . Diabetes Father   . Heart disease Father   . Colon cancer Father        brain tumor  . Colon polyps Father   . Colitis Father   . Irritable bowel syndrome Father   . Diabetes Maternal Grandmother   . Diabetes Maternal Grandfather   . Schizophrenia Maternal Grandfather   . Heart disease Paternal Uncle   . Diabetes Paternal Uncle   . Heart disease Paternal Grandmother   . Heart disease Paternal Grandfather   . Diabetes Maternal Aunt   . Diabetes Maternal Uncle   . Diabetes Paternal Aunt     Social History Social History  Substance Use Topics  . Smoking status: Never Smoker  . Smokeless tobacco: Never Used  . Alcohol use 0.0  oz/week     Comment: maybe once a month     Allergies   Codeine   Review of Systems Review of Systems  Constitutional: Negative for appetite change and fever.  HENT: Positive for trouble swallowing. Negative for congestion and sore throat.   Respiratory: Positive for cough and shortness of breath.   Gastrointestinal: Positive for nausea. Negative for abdominal pain.  Genitourinary: Negative for flank pain.  Musculoskeletal: Negative for back pain.  Skin: Positive for rash.  Neurological: Negative for tremors and seizures.  Psychiatric/Behavioral: Negative for confusion.     Physical Exam Updated Vital Signs BP 116/77   Pulse 85   Temp 98.2 F (36.8 C) (Oral)   Resp 13   SpO2 100%   Physical Exam  Constitutional: She appears well-developed.  HENT:  Head: Atraumatic.  No posterior pharyngeal edema.  Eyes: EOM are normal.  Neck: Neck supple.  Cardiovascular: Normal rate.   Pulmonary/Chest: Effort normal. She has no wheezes. She has no rales.  Abdominal: Soft.  Musculoskeletal: She exhibits no edema.  Neurological: She is alert.  Skin:  Scattered hives.     ED Treatments / Results  Labs (all labs ordered are listed, but only abnormal results are displayed) Labs Reviewed  CBC - Abnormal; Notable for the following:       Result Value   WBC 14.6 (*)    Platelets 521 (*)    All other components within normal limits  BASIC METABOLIC PANEL  I-STAT TROPOININ, ED    EKG  EKG Interpretation None       Radiology Dg Chest 2 View  Result Date: 10/05/2016 CLINICAL DATA:  Mid chest pain today.  Diffuse itching today. EXAM: CHEST  2 VIEW COMPARISON:  09/12/2016. FINDINGS: Normal sized heart. Clear lungs. Stable mildly elevated right hemidiaphragm. Unremarkable bones. IMPRESSION: No acute abnormality. Electronically Signed   By: Claudie Revering M.D.   On: 10/05/2016 18:16    Procedures Procedures (including critical care time)  Medications Ordered in  ED Medications  diphenhydrAMINE (BENADRYL) injection 25 mg (25 mg Intravenous Given 10/05/16 1844)  famotidine (PEPCID) IVPB 20 mg premix (0 mg Intravenous Stopped 10/05/16 1950)  ondansetron (ZOFRAN) injection 4 mg (4 mg Intravenous Given 10/05/16 1844)  predniSONE (DELTASONE) tablet 40 mg (40 mg Oral Given 10/05/16  86)     Initial Impression / Assessment and Plan / ED Course  I have reviewed the triage vital signs and the nursing notes.  Pertinent labs & imaging results that were available during my care of the patient were reviewed by me and considered in my medical decision making (see chart for details).     Patient developed shortness of breath and itching after eating tuna. Diffuse rash. Some throat involvement. Has been monitored after Benadryl and Pepcid. Has improved. No previous history of allergies. Will discharge to follow-up with her primary care doctor.  Final Clinical Impressions(s) / ED Diagnoses   Final diagnoses:  Allergic reaction, initial encounter    New Prescriptions Discharge Medication List as of 10/05/2016  7:54 PM    START taking these medications   Details  predniSONE (DELTASONE) 20 MG tablet Take 2 tablets (40 mg total) by mouth daily., Starting Tue 10/06/2016, Print         Davonna Belling, MD 10/05/16 2322

## 2016-10-05 NOTE — ED Notes (Signed)
Patient transported to X-ray 

## 2016-10-07 ENCOUNTER — Telehealth: Payer: Self-pay | Admitting: Gastroenterology

## 2016-10-07 NOTE — Telephone Encounter (Signed)
Agree with your recommendations. If she needs more Zofran she can call us back, otherwise continue PPI and follow up with allergist regarding potential allergy workup

## 2016-10-07 NOTE — Telephone Encounter (Signed)
Patient was seen in the ED on Monday, 6/4 for an allergic reaction after eating tuna. She states she ate some crackers last night and that they came back up. She is able to drink, states her stomach is "raw". She is taking her Dexilant, but has not done so this morning. She was having no problems prior to this allergic reaction. She is not sure if she has any refills on her Zofran, I asked her to check with her pharmacy and if she needs a refill to have them contact our office. I have asked her to try soft/liquid diet, she does have an appointment with an Allergist. Asked her to call back if she is still having problems after taking her medications and trying foods that would be easier on her stomach. She was told to follow up with her PCP afterwards but patient has not done this.

## 2016-10-12 ENCOUNTER — Encounter: Payer: Self-pay | Admitting: Podiatry

## 2016-10-12 ENCOUNTER — Ambulatory Visit (INDEPENDENT_AMBULATORY_CARE_PROVIDER_SITE_OTHER): Payer: Medicare Other | Admitting: Podiatry

## 2016-10-12 DIAGNOSIS — M19071 Primary osteoarthritis, right ankle and foot: Secondary | ICD-10-CM

## 2016-10-12 DIAGNOSIS — M19072 Primary osteoarthritis, left ankle and foot: Secondary | ICD-10-CM | POA: Diagnosis not present

## 2016-10-12 DIAGNOSIS — M779 Enthesopathy, unspecified: Secondary | ICD-10-CM

## 2016-10-12 DIAGNOSIS — M722 Plantar fascial fibromatosis: Secondary | ICD-10-CM

## 2016-10-13 ENCOUNTER — Telehealth: Payer: Self-pay | Admitting: Family Medicine

## 2016-10-13 DIAGNOSIS — T7840XS Allergy, unspecified, sequela: Secondary | ICD-10-CM

## 2016-10-13 NOTE — Telephone Encounter (Signed)
Pt says she called an allergist to make an appt after having a reaction to tuna.  She wants to go Dr Ernst Bowler, office is at the corner of [phone]. Please let pt know when the referral has been made.

## 2016-10-14 NOTE — Progress Notes (Signed)
Subjective: 46 year old female presents the also discuss MRI results of her left foot patient at the injection she had to help some but she continues to get pain in the left foot. This been ongoing now for quite some time. She denies any recent injury or trauma since her last appointment with Dr. Paulla Dolly.  Denies any increase in swelling or redness. She does that she has pain when she does a lot of walking or standing. Denies any systemic complaints such as fevers, chills, nausea, vomiting. No acute changes since last appointment, and no other complaints at this time.   Objective: AAO x3, NAD DP/PT pulses palpable bilaterally, CRT less than 3 seconds On the left ankle there is tenderness palpation on the course of the flexor tendon as well as the posterior tibial tendon just posterior to the medial malleolus. Hemostasis is minimal tenderness along the peroneal tendon. There is no area pinpoint bony tenderness or pain the vibratory sensation. There is no pain with subtalar joint range of motion however there is mild discomfort on the sinus tarsi. There is a decrease in medial arch height upon weightbearing. Equinus is present. No edema, erythema, increase in warmth otherwise. No open lesions or pre-ulcerative lesions.  There is mild discomfort to the right foot in the same areas of the left upper not as bad as left side. No area pinpoint tenderness to on the right foot. No pain with calf compression, swelling, warmth, erythema  MRI 09/19/2016 IMPRESSION: 1. Moderate distal tibialis posterior tendinopathy, correlate clinically in assessing for tibialis posterior dysfunction. 2. Mild flexor tenosynovitis in the ankle. 3. Mild extensor digitorum longus tenosynovitis and tendinopathy. 4. Proximal plantar fasciitis. 5. Thickened medioplantar oblique component of the spring ligament with surrounding edema, potentially from sprain or chronic injury. 6. Several small degenerative subcortical cystic lesions,  along the posterior subtalar facet and Lisfranc joint. 7. Subtle nonspecific edema in the abductor digiti minimi muscle at the level of the base of the fifth metatarsal, significance uncertain.  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  Plan: -All treatment options discussed with the patient including all alternatives, risks, complications.  -I reviewed the MRI findings with the patient. I discussed both conservative and surgical treatment options. I recommended orthotic for her shoe. Upon discussing her insurance coverage she states that I "need to give her some before for free" -I will get her a powerstep to try. Also discussed PT.  -I discussed shoe gear modifications as well. She presents a wearing nonsupportive shoes. -She seemed aggravated today that I cannot get rid of all the pain. I discussed with her that we can work on this but we will work on it together.  -Patient encouraged to call the office with any questions, concerns, change in symptoms.   Celesta Gentile, DPM

## 2016-10-15 ENCOUNTER — Telehealth: Payer: Self-pay | Admitting: *Deleted

## 2016-10-15 NOTE — Telephone Encounter (Signed)
Dr. Jacqualyn Posey requested pt receive OTC PowerSteps from our office since her insurance did not cover the custom made orthotics and he felt she would benefit from the PowerSteps. I informed pt and she agreed and will be here tomorrow between 8:00am and 12:00pm, I told her to ask for Marylou Mccoy, RN. Pt states she wears a 10 in womens' shoes.

## 2016-10-26 ENCOUNTER — Telehealth: Payer: Self-pay | Admitting: Gastroenterology

## 2016-10-26 NOTE — Telephone Encounter (Signed)
Patient advised to try Zantac and Mylanta and closely follow antireflux measures.

## 2016-10-26 NOTE — Telephone Encounter (Signed)
Routed to DOD, patient of Dr. Havery Moros, still having a lot of reflux since she had an allergic reaction to tuna and went to ED about 3 weeks ago. She continues to take Dexilant once a day, with no relief. On the phone, patient is continually having to clear her throat, which is not her normal. She continues to take OTC benadryl. Patient just made her appointment with the allergist, appointment not until 8/1. Please advise.

## 2016-10-26 NOTE — Telephone Encounter (Signed)
Try Zantac 150 mg OTC bid and Mylanta qid prn.  Closely follow antireflux measures.

## 2016-10-30 ENCOUNTER — Encounter: Payer: Self-pay | Admitting: Podiatry

## 2016-10-30 ENCOUNTER — Ambulatory Visit (INDEPENDENT_AMBULATORY_CARE_PROVIDER_SITE_OTHER): Payer: Medicare Other | Admitting: Podiatry

## 2016-10-30 DIAGNOSIS — M19071 Primary osteoarthritis, right ankle and foot: Secondary | ICD-10-CM | POA: Diagnosis not present

## 2016-10-30 DIAGNOSIS — M779 Enthesopathy, unspecified: Secondary | ICD-10-CM | POA: Diagnosis not present

## 2016-10-30 DIAGNOSIS — M19072 Primary osteoarthritis, left ankle and foot: Secondary | ICD-10-CM

## 2016-11-03 ENCOUNTER — Telehealth: Payer: Self-pay | Admitting: Family Medicine

## 2016-11-03 NOTE — Telephone Encounter (Signed)
Would you like this patient to be seen in clinic? Derl Barrow, RN

## 2016-11-03 NOTE — Telephone Encounter (Signed)
Pt can't get in with the allergist for awhile and they told her PCP could order blood work and some kind of test to find out what the pt is allergic to. Pt is taking 5 Benadryl a day, having burning down her throat when she eats. ep

## 2016-11-03 NOTE — Telephone Encounter (Signed)
Pt will be seen in same day clinic on Thursday with Dr. Emmaline Life. ep

## 2016-11-05 ENCOUNTER — Ambulatory Visit (INDEPENDENT_AMBULATORY_CARE_PROVIDER_SITE_OTHER): Payer: Medicare Other | Admitting: Internal Medicine

## 2016-11-05 ENCOUNTER — Encounter: Payer: Self-pay | Admitting: Internal Medicine

## 2016-11-05 VITALS — BP 112/76 | HR 84 | Temp 98.0°F | Ht 67.0 in | Wt 335.4 lb

## 2016-11-05 DIAGNOSIS — R198 Other specified symptoms and signs involving the digestive system and abdomen: Secondary | ICD-10-CM

## 2016-11-05 NOTE — Patient Instructions (Signed)
I will continue the Mylanta and Benadryl for now. We will check blood work up. Make sure to follow up with allergist and I would recommend an appointment with GI

## 2016-11-05 NOTE — Progress Notes (Signed)
   [facility] Kerrin Mo, MD Phone: [phone]  Reason For Visit: SDA for Rash  # Patient states for the past mont and a half she has itching, chest pain, burning sensation in the esophageus when she eats any food. She states that this all began after she ate some tuna about a month ago.  She indicates breaking out in "hives" per patient - she describes this as itching on her arms and chest. Denies any erythematous migratory macules. She indicate chest pain, burning in the esophagus also associated with these symptoms. She called the GI doctor who told her to take Mylanta. She has been take this and it has eases her symptoms. Indicates taking Dexlansoprazole daily no issues with this medication. She has also been taking the Benadryl for itching which has equally helped some. Again indicates these symptoms are associated with all foods.  No abdominal pain. No vomiting, No SOB. Has an appointment with an [facility] in august but is wondering if we can do any allergy testing here.   Past Medical History Reviewed problem list.  Medications- reviewed and updated No additions to family history Social history- patient is a non smoker  Objective: BP 112/76   Pulse 84   Temp 98 F (36.7 C) (Oral)   Ht 5\' 7"  (1.702 m)   Wt (!) 335 lb 6.4 oz (152.1 kg)   SpO2 99%   BMI 52.53 kg/m  Gen: NAD, alert, cooperative with exam HEENT: Normal    Neck: No masses palpated. No lymphadenopathy    Eyes: Normal conjunctiva     Nose: nasal turbinates moist    Throat: moist mucus membranes, no erythema, no swelling noted  Cardio: regular rate and rhythm, S1S2 heard, no murmurs appreciated Pulm: clear to auscultation bilaterally, no wheezes, rhonchi or rales GI: soft, non-tender, non-distended, bowel sounds present, no hepatomegaly, no splenomegaly Skin: dry, intact, no rashes or lesions  Assessment/Plan: See problem based a/p  Symptoms of gastroesophageal reflux Symptoms most  consistent with reflux - Continue Dexlansoprazole  - Continue Mylanti as prescribed by GI - this relieves symptoms  - Follow up with GI  - Follow up with allergist  - Obtain CBC to look for eosinophilia - thought unlikely allergic

## 2016-11-06 LAB — CBC WITH DIFFERENTIAL/PLATELET
Basophils Absolute: 0.1 10*3/uL (ref 0.0–0.2)
Basos: 0 %
EOS (ABSOLUTE): 0.1 10*3/uL (ref 0.0–0.4)
Eos: 1 %
Hematocrit: 38.1 % (ref 34.0–46.6)
Hemoglobin: 12.2 g/dL (ref 11.1–15.9)
Immature Grans (Abs): 0 10*3/uL (ref 0.0–0.1)
Immature Granulocytes: 0 %
Lymphocytes Absolute: 4.1 10*3/uL — ABNORMAL HIGH (ref 0.7–3.1)
Lymphs: 32 %
MCH: 26.4 pg — ABNORMAL LOW (ref 26.6–33.0)
MCHC: 32 g/dL (ref 31.5–35.7)
MCV: 83 fL (ref 79–97)
Monocytes Absolute: 0.8 10*3/uL (ref 0.1–0.9)
Monocytes: 6 %
Neutrophils Absolute: 7.9 10*3/uL — ABNORMAL HIGH (ref 1.4–7.0)
Neutrophils: 61 %
Platelets: 508 10*3/uL — ABNORMAL HIGH (ref 150–379)
RBC: 4.62 x10E6/uL (ref 3.77–5.28)
RDW: 15.6 % — ABNORMAL HIGH (ref 12.3–15.4)
WBC: 13 10*3/uL — ABNORMAL HIGH (ref 3.4–10.8)

## 2016-11-09 ENCOUNTER — Encounter: Payer: Self-pay | Admitting: Internal Medicine

## 2016-11-09 DIAGNOSIS — R198 Other specified symptoms and signs involving the digestive system and abdomen: Secondary | ICD-10-CM | POA: Insufficient documentation

## 2016-11-09 NOTE — Assessment & Plan Note (Addendum)
Symptoms most consistent with reflux - Continue Dexlansoprazole  - Continue Mylanti as prescribed by GI - this relieves symptoms  - Follow up with GI  - Follow up with allergist  - Obtain CBC to look for eosinophilia - thought unlikely allergic

## 2016-11-09 NOTE — Progress Notes (Signed)
Subjective: 46 year old female presents to the office they for follow-up. She states that she is doing the same. Since last Wednesday she had no treatment. She not combined the inserts she is not ready custom orthotics due to the cost. She has not changed her shoes either. She states that she still gets pain since the left side and this is unchanged compared to last appointment. She has no new concerns.   Objective: AAO x3, NAD DP/PT pulses palpable bilaterally, CRT less than 3 seconds On the left ankle there is tenderness palpation on the course of the flexor tendon as well as the posterior tibial tendon just posterior to the medial malleolus. Hemostasis is minimal tenderness along the peroneal tendon. There is no area pinpoint bony tenderness or pain the vibratory sensation. There is no pain with subtalar joint range of motion however there is mild discomfort on the sinus tarsi. There is a decrease in medial arch height upon weightbearing. Equinus is present. No edema, erythema, increase in warmth otherwise. No open lesions or pre-ulcerative lesions.  There is mild discomfort to the right foot in the same areas of the left upper not as bad as left side. No area pinpoint tenderness to on the right foot. Overall, the exam is unchanged.  No pain with calf compression, swelling, warmth, erythema  MRI 09/19/2016 IMPRESSION: 1. Moderate distal tibialis posterior tendinopathy, correlate clinically in assessing for tibialis posterior dysfunction. 2. Mild flexor tenosynovitis in the ankle. 3. Mild extensor digitorum longus tenosynovitis and tendinopathy. 4. Proximal plantar fasciitis. 5. Thickened medioplantar oblique component of the spring ligament with surrounding edema, potentially from sprain or chronic injury. 6. Several small degenerative subcortical cystic lesions, along the posterior subtalar facet and Lisfranc joint. 7. Subtle nonspecific edema in the abductor digiti minimi muscle at the  level of the base of the fifth metatarsal, significance uncertain.  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  Plan: -All treatment options discussed with the patient including all alternatives, risks, complications.  -Given a discussion with her. The power steps were dispensed to her today and break in instructions were discussed. Discussed shoe gear modifications as well. Discussed the possible physical therapy recommended start some home stretching exercises and rehabilitation exercises.  Celesta Gentile, DPM

## 2016-11-11 ENCOUNTER — Ambulatory Visit (INDEPENDENT_AMBULATORY_CARE_PROVIDER_SITE_OTHER): Payer: Medicare Other | Admitting: Physician Assistant

## 2016-11-11 ENCOUNTER — Encounter: Payer: Self-pay | Admitting: Physician Assistant

## 2016-11-11 ENCOUNTER — Ambulatory Visit: Payer: Medicare Other | Admitting: Podiatry

## 2016-11-11 VITALS — BP 100/70 | HR 93 | Ht 67.0 in | Wt 337.0 lb

## 2016-11-11 DIAGNOSIS — R1310 Dysphagia, unspecified: Secondary | ICD-10-CM | POA: Diagnosis not present

## 2016-11-11 DIAGNOSIS — K2100 Gastro-esophageal reflux disease with esophagitis, without bleeding: Secondary | ICD-10-CM

## 2016-11-11 DIAGNOSIS — T7840XS Allergy, unspecified, sequela: Secondary | ICD-10-CM

## 2016-11-11 MED ORDER — SUCRALFATE 1 GM/10ML PO SUSP: 1.0000 g | Freq: Three times a day (TID) | ORAL | 0 refills | Status: DC

## 2016-11-11 NOTE — Progress Notes (Signed)
Chief Complaint: GERD  HPI:   Crystal Garza is a 46 year old female with a past medical history anxiety, depression, GERD, hypertension, morbid obesity, PE and DVT maintained on Xarelto. and others listed below, who returns to clinic today with complaint of worsening symptoms of reflux associated with nausea and occasional vomiting.     Patient was last seen in clinic on 05/06/16 by Dr. Havery Moros and at that time a recent EGD completed 03/03/16 was reviewed showing esophagitis at the GE J, 2 cm hiatal hernia with evidence of gastric sleeve surgery. Biopsies were negative for H. pylori. She had been increased to 40 mg of omeprazole twice daily at that time but then transitioned to Dexilant 60 mg daily. She was not controlled at that time and did have some regurgitation of food with heartburn. She also endorsed some dysphagia in her lower chest. She was also experiencing a "burning in sensation" in the left upper quadrant at that time constant. Patient had a CT of the abdomen in May which did not show any pathology for abdominal pain or burning. It was thought that she had a motility disorder causing symptoms or nonacid reflux. She was offered esophageal manometry to rule out dysmotility and also 24 hour pH impedance study and to clarify if she was having true breakthrough reflux on PPI. She wished to proceed with these. She was told to use Gaviscon as necessary for breakthrough symptoms. Patient was also given a follow-up ultrasound after abnormal CT of her liver.    Esophageal manometry and 24-hour pH pH impedance studies showed suboptimal study due to double swallows however fragmented peristalsis noted with some poor bolus clearance, but no other major abnormality and achalasia. 24 hour impedance study was consistent with controlled acid reflux disease on PPI, was added to be a component of functional heartburn symptoms in addition to nonspecific change is noted on manometry. She was told to continue  [place].    Today, the patient returns to clinic accompanied by her young daughter and tells me that in the beginning of June she ate some tuna and experienced burning in her esophagus for the next 2 hours, her voice then became hoarse and she developed itching all over her body. She then proceeded to the ER and had vomiting. Patient tells me she was treated for this episode and since then has had trouble with this "burning in my esophagus", as well as some nausea and a feeling of things getting "sort of stuck on the way down". Patient tells me she was actually having that symptom for months. Patient notes that she had eaten tuna before then and it had never given her trouble, but now anything that she eats seems to give her problems. She does list foods to me such as "buttered mashed potatoes, fried okra and grilled chicken", which will "burn on the way down". The patient has added in Mylanta 4 times a day as well as Zantac 150 mg twice a day as suggested by our clinic and continues her Dexilant 60 mg, but has had no change to her symptoms. She tells me she is due to follow with an allergist on August 2.   Patient denies fever, chills, blood in her stool, melena, change in bowel habits, weight loss, anorexia, abdominal pain or symptoms that awaken her at night.  Past Medical History:  Diagnosis Date  . Anemia    iron def  . Anginal pain ([place])   . Anxiety   . Arthritis  left knee   . Back pain   . Benign gastrointestinal stromal tumor (GIST)   . Chronic knee pain   . Depression   . DVT (deep venous thrombosis) ([place])   . GERD (gastroesophageal reflux disease)   . Hx of laparoscopic gastric banding   . Hypertension   . Kidney stones   . Migraine   . Morbid obesity ([place])   . PE (pulmonary embolism)     Past Surgical History:  Procedure Laterality Date  . [place] STUDY N/A 05/13/2016   Procedure: [place] STUDY;  Surgeon: Manus Gunning, MD;  Location: [facility];  Service:  Gastroenterology;  Laterality: N/A;  . ABDOMINAL HYSTERECTOMY    . BIOPSY  02/16/2012   Procedure: BIOPSY;  Surgeon: [name], MD;  Location: [facility];  Service: General;;  biopsy of mass x 2  . BREATH TEK H PYLORI  07/28/2011   Procedure: [place];  Surgeon: [name], MD;  Location: [facility];  Service: General;  Laterality: N/A;  to be done at 745  . CESAREAN SECTION  05/1991  . COLONOSCOPY WITH PROPOFOL N/A 03/03/2016   Procedure: COLONOSCOPY WITH PROPOFOL;  Surgeon: Manus Gunning, MD;  Location: [facility];  Service: Gastroenterology;  Laterality: N/A;  . ESOPHAGEAL MANOMETRY N/A 05/13/2016   Procedure: ESOPHAGEAL MANOMETRY (EM);  Surgeon: Manus Gunning, MD;  Location: [facility];  Service: Gastroenterology;  Laterality: N/A;  . ESOPHAGOGASTRODUODENOSCOPY N/A 08/29/2012   Procedure: ESOPHAGOGASTRODUODENOSCOPY (EGD);  Surgeon: [name], MD;  Location: [facility];  Service: General;  Laterality: N/A;  . ESOPHAGOGASTRODUODENOSCOPY N/A 09/04/2014   Procedure: ESOPHAGOGASTRODUODENOSCOPY (EGD);  Surgeon: Alphonsa Overall, MD;  Location: [facility];  Service: General;  Laterality: N/A;  . ESOPHAGOGASTRODUODENOSCOPY (EGD) WITH PROPOFOL N/A 03/03/2016   Procedure: ESOPHAGOGASTRODUODENOSCOPY (EGD) WITH PROPOFOL;  Surgeon: Manus Gunning, MD;  Location: [facility];  Service: Gastroenterology;  Laterality: N/A;  . EUS  03/03/2012   Procedure: UPPER ENDOSCOPIC ULTRASOUND (EUS) LINEAR;  Surgeon: Milus Banister, MD;  Location: [facility];  Service: Endoscopy;  Laterality: N/A;  . gist    . KNEE ARTHROSCOPY    . LAPAROSCOPIC GASTRECTOMY  04/08/2012   Procedure: LAPAROSCOPIC GASTRECTOMY;  Surgeon: [name], MD;  Location: [facility];  Service: General;;  removal of GIST tumor of stomach  . LAPAROSCOPIC GASTRIC SLEEVE RESECTION N/A 08/29/2012   Procedure: LAPAROSCOPIC GASTRIC SLEEVE RESECTION;  Surgeon: [name], MD;  Location: [facility];   Service: General;  Laterality: N/A;  Sleeve Gastrectomy  . TOTAL KNEE ARTHROPLASTY Left 10/06/2013   Procedure: LEFT TOTAL KNEE ARTHROPLASTY;  Surgeon: Mcarthur Rossetti, MD;  Location: [facility];  Service: Orthopedics;  Laterality: Left;  . TUBAL LIGATION      Current Outpatient Prescriptions  Medication Sig Dispense Refill  . dexlansoprazole (DEXILANT) 60 MG capsule Take 1 capsule (60 mg total) by mouth daily. 90 capsule 3  . hydrOXYzine (VISTARIL) 25 MG capsule Take 1 capsule (25 mg total) by mouth at bedtime. 30 capsule 2  . ibuprofen (ADVIL,MOTRIN) 600 MG tablet Take 1 tablet (600 mg total) by mouth every 6 (six) hours as needed. 30 tablet 0  . lamoTRIgine (LAMICTAL) 200 MG tablet Take 1 tablet (200 mg total) by mouth at bedtime. 90 tablet 3  . lisinopril (PRINIVIL,ZESTRIL) 10 MG tablet Take 1 tablet (10 mg total) by mouth daily. 90 tablet 3  . meclizine (ANTIVERT) 32 MG tablet Take 1 tablet (32 mg total)  by mouth 3 (three) times daily as needed. 30 tablet 0  . mupirocin ointment (BACTROBAN) 2 % Apply t o affected skin area twice daily as directed 60 g 0  . nystatin cream (MYCOSTATIN) APPLY TOPICALLY 2 TIMES A DAY (Patient taking differently: Apply 1 application topically 2 (two) times daily as needed for dry skin. ) 60 g 5  . ondansetron (ZOFRAN-ODT) 4 MG disintegrating tablet Take 4 mg by mouth every 8 (eight) hours as needed for nausea.     Marland Kitchen oxyCODONE-acetaminophen (PERCOCET) 7.5-325 MG tablet Take one tablet by mouth every 8 hours as needed for pain do not fill before November 03 2016 90 tablet 0  . predniSONE (DELTASONE) 20 MG tablet Take 2 tablets (40 mg total) by mouth daily. 4 tablet 0  . risperiDONE (RISPERDAL) 1 MG tablet Take 1 tablet (1 mg total) by mouth at bedtime. 90 tablet 3  . rivaroxaban (XARELTO) 20 MG TABS tablet Take 1 tablet (20 mg total) by mouth daily with supper. 90 tablet 3  . tiZANidine (ZANAFLEX) 4 MG tablet Take 1 tablet (4 mg total) by mouth 2 (two) times daily as  needed for muscle spasms. 60 tablet 12  . VESICARE 5 MG tablet Take 5 mg by mouth daily.      No current facility-administered medications for this visit.     Allergies as of 11/11/2016 - Review Complete 11/11/2016  Allergen Reaction Noted  . Codeine Rash     Family History  Problem Relation Age of Onset  . Diabetes Mother   . Diabetes Father   . Heart disease Father   . Colon cancer Father        brain tumor  . Colon polyps Father   . Colitis Father   . Irritable bowel syndrome Father   . Diabetes Maternal Grandmother   . Diabetes Maternal Grandfather   . Schizophrenia Maternal Grandfather   . Heart disease Paternal Uncle   . Diabetes Paternal Uncle   . Heart disease Paternal Grandmother   . Heart disease Paternal Grandfather   . Diabetes Maternal Aunt   . Diabetes Maternal Uncle   . Diabetes Paternal Aunt     Social History   Social History  . Marital status: Single    Spouse name: N/A  . Number of children: N/A  . Years of education: N/A   Occupational History  . Not on file.   Social History Main Topics  . Smoking status: Never Smoker  . Smokeless tobacco: Never Used  . Alcohol use 0.0 oz/week     Comment: maybe once a month  . Drug use: No  . Sexual activity: Not on file   Other Topics Concern  . Not on file   Social History Narrative   Patient has been on disability since 2004 for knee pain, back pain, depression and acid reflux. She does work part-time now.       She is caring for her granddaughter Amia age 47 (has sickle cell, goes to [facility] every month and has had multiple hospitalizations).     Review of Systems:    Constitutional: No weight loss, fever or chills Skin: No rash Cardiovascular: No chest pain Respiratory: No SOB Gastrointestinal: See HPI and otherwise negative   Physical Exam:  Vital signs: BP 100/70   Pulse 93   Ht 5\' 7"  (1.702 m)   Wt (!) 337 lb (152.9 kg)   BMI 52.78 kg/m    Constitutional:   Pleasant obese African  American  female appears to be in NAD, Well developed, Well nourished, alert and cooperative Respiratory: Respirations even and unlabored. Lungs clear to auscultation bilaterally.   No wheezes, crackles, or rhonchi.  Cardiovascular: Normal S1, S2. No MRG. Regular rate and rhythm. No peripheral edema, cyanosis or pallor.  Gastrointestinal:  Soft, nondistended, nontender. No rebound or guarding. Normal bowel sounds. No appreciable masses or hepatomegaly. Rectal:  Not performed.  Psychiatric:  Demonstrates good judgement and reason without abnormal affect or behaviors.  RELEVANT LABS AND IMAGING: CBC    Component Value Date/Time   WBC 13.0 (H) 11/05/2016 1541   WBC 14.6 (H) 10/05/2016 1656   RBC 4.62 11/05/2016 1541   RBC 4.74 10/05/2016 1656   HGB 12.2 11/05/2016 1541   HCT 38.1 11/05/2016 1541   PLT 508 (H) 11/05/2016 1541   MCV 83 11/05/2016 1541   MCH 26.4 (L) 11/05/2016 1541   MCH 27.2 10/05/2016 1656   MCHC 32.0 11/05/2016 1541   MCHC 32.4 10/05/2016 1656   RDW 15.6 (H) 11/05/2016 1541   LYMPHSABS 4.1 (H) 11/05/2016 1541   MONOABS 0.6 04/11/2016 0704   EOSABS 0.1 11/05/2016 1541   BASOSABS 0.1 11/05/2016 1541    CMP     Component Value Date/Time   NA 137 10/05/2016 1656   K 4.2 10/05/2016 1656   CL 104 10/05/2016 1656   CO2 22 10/05/2016 1656   GLUCOSE 86 10/05/2016 1656   BUN 11 10/05/2016 1656   CREATININE 0.98 10/05/2016 1656   CREATININE 0.76 08/22/2014 0910   CALCIUM 9.2 10/05/2016 1656   PROT 8.0 04/11/2016 0704   ALBUMIN 3.8 04/11/2016 0704   AST 16 04/11/2016 0704   ALT 11 (L) 04/11/2016 0704   ALKPHOS 57 04/11/2016 0704   BILITOT 0.2 (L) 04/11/2016 0704   GFRNONAA >60 10/05/2016 1656   GFRAA >60 10/05/2016 1656    Assessment: 1. GERD: Again patient has had multiple procedures EGD, esophageal manometry and 24-hour pH impedance, thought to be well-controlled on Dexilant in the past, recent exacerbation of symptoms after allergic reaction below; Consider  worsened GERD vs esophagitis vs other 2. Dysphagia:Patient has had this feeling forever, EGD and esophageal manometry as well as 24 hour pH impedance study all negative for etiology, thought possibly functional in nature 3. Allergic Reaction:Patient with documented report from ER of allergic reaction to tuna, she is following with allergist on August 2, since this reaction has had an increase in heartburn and reflux symptoms  Plan: 1. Continue Dexilant 60mg  once daily 2. Start Carafte suspension four times daily, 30 min before meals 3. Can discontinue Zantac 75 mg BID and Mylanta QID and only use these prn for symptoms 4. Reviewed anti-reflux diet, explained that greasy or fatty foods can also cause symptoms 5. Patient to follow with her allergist as scheduled on August 2nd 6. Agree with PCP in that patient should have appointment with surgeon who placed her gastric sleeve to discuss possibility of a gastric bypass procedure instead, as she seems to have had exacerbation of her reflux symptoms after placement of sleeve. 7. Did discuss that she could possibly benefit from a repeat EGD after recent allergic reaction and exacerbation of symptoms if these do not resolve/decrease over the next month or so 8. Patient to follow in clinic with Dr. Havery Moros in the next 4-6 weeks or sooner if necessary  Ellouise Newer, PA-C [facility] 11/11/2016, 8:52 AM  Cc: Dickie La, MD

## 2016-11-11 NOTE — Patient Instructions (Signed)
We have sent the following medications to your pharmacy for you to pick up at your convenience: [facility]. 1. Carafate Liquid, Take 10 ml's by mouth 30 min before meals and at bedtime.   You can stop Zantac and Mylanta if no symptoms.  Continue the Dexilant. 60 mg.

## 2016-11-12 NOTE — Progress Notes (Signed)
Agree with assessment and plan as outlined. If she continues to have heartburn symptoms, based on last pH study with normal [place] and poor correlation of symptoms to reflux episodes, she would be considered to have functional heartburn. A TCA would be the best way to manage persistent symptoms in this setting however she is taking multiple medications, some of which could interact with TCA. Agree with plan as outlined, if she remains significantly symptomatic she can return and discuss potential TCA with me.

## 2016-11-16 ENCOUNTER — Emergency Department (HOSPITAL_COMMUNITY)
Admission: EM | Admit: 2016-11-16 | Discharge: 2016-11-16 | Disposition: A | Discharge status: Home / Self Care | Payer: Medicare Other | Attending: Emergency Medicine | Admitting: Emergency Medicine

## 2016-11-16 ENCOUNTER — Emergency Department (HOSPITAL_COMMUNITY): Payer: Medicare Other

## 2016-11-16 ENCOUNTER — Telehealth: Payer: Self-pay | Admitting: Gastroenterology

## 2016-11-16 ENCOUNTER — Other Ambulatory Visit: Payer: Self-pay

## 2016-11-16 DIAGNOSIS — R0789 Other chest pain: Secondary | ICD-10-CM | POA: Diagnosis not present

## 2016-11-16 DIAGNOSIS — G8929 Other chronic pain: Secondary | ICD-10-CM | POA: Diagnosis not present

## 2016-11-16 DIAGNOSIS — R109 Unspecified abdominal pain: Secondary | ICD-10-CM | POA: Diagnosis not present

## 2016-11-16 DIAGNOSIS — I10 Essential (primary) hypertension: Secondary | ICD-10-CM | POA: Insufficient documentation

## 2016-11-16 DIAGNOSIS — N342 Other urethritis: Secondary | ICD-10-CM | POA: Insufficient documentation

## 2016-11-16 DIAGNOSIS — Z86718 Personal history of other venous thrombosis and embolism: Secondary | ICD-10-CM | POA: Diagnosis not present

## 2016-11-16 DIAGNOSIS — R1033 Periumbilical pain: Secondary | ICD-10-CM | POA: Diagnosis present

## 2016-11-16 DIAGNOSIS — Z6841 Body Mass Index (BMI) 40.0 and over, adult: Secondary | ICD-10-CM | POA: Insufficient documentation

## 2016-11-16 DIAGNOSIS — Z7901 Long term (current) use of anticoagulants: Secondary | ICD-10-CM | POA: Diagnosis not present

## 2016-11-16 DIAGNOSIS — R079 Chest pain, unspecified: Secondary | ICD-10-CM | POA: Diagnosis not present

## 2016-11-16 DIAGNOSIS — Z79899 Other long term (current) drug therapy: Secondary | ICD-10-CM | POA: Insufficient documentation

## 2016-11-16 DIAGNOSIS — D649 Anemia, unspecified: Secondary | ICD-10-CM | POA: Insufficient documentation

## 2016-11-16 LAB — BASIC METABOLIC PANEL
Anion gap: 8 (ref 5–15)
BUN: 6 mg/dL (ref 6–20)
CO2: 25 mmol/L (ref 22–32)
Calcium: 9.2 mg/dL (ref 8.9–10.3)
Chloride: 109 mmol/L (ref 101–111)
Creatinine, Ser: 0.77 mg/dL (ref 0.44–1.00)
GFR calc Af Amer: >60 mL/min (ref >60)
GFR calc non Af Amer: >60 mL/min (ref >60)
Glucose, Bld: 91 mg/dL (ref 65–99)
Potassium: 3.8 mmol/L (ref 3.5–5.1)
Sodium: 142 mmol/L (ref 135–145)

## 2016-11-16 LAB — POCT I-STAT TROPONIN I
Troponin i, poc: 0 ng/mL (ref 0.00–0.08)
Troponin i, poc: 0 ng/mL (ref 0.00–0.08)

## 2016-11-16 LAB — CBC
HCT: 40.7 % (ref 36.0–46.0)
Hemoglobin: 13 g/dL (ref 12.0–15.0)
MCH: 26.6 pg (ref 26.0–34.0)
MCHC: 31.9 g/dL (ref 30.0–36.0)
MCV: 83.4 fL (ref 78.0–100.0)
Platelets: 439 10*3/uL — ABNORMAL HIGH (ref 150–400)
RBC: 4.88 MIL/uL (ref 3.87–5.11)
RDW: 14.4 % (ref 11.5–15.5)
WBC: 13.1 10*3/uL — ABNORMAL HIGH (ref 4.0–10.5)

## 2016-11-16 LAB — HEPATIC FUNCTION PANEL
ALT: 11 U/L — ABNORMAL LOW (ref 14–54)
AST: 18 U/L (ref 15–41)
Albumin: 3.4 g/dL — ABNORMAL LOW (ref 3.5–5.0)
Alkaline Phosphatase: 54 U/L (ref 38–126)
Bilirubin, Direct: 0.1 mg/dL (ref 0.1–0.5)
Indirect Bilirubin: 0.4 mg/dL (ref 0.3–0.9)
Total Bilirubin: 0.5 mg/dL (ref 0.3–1.2)
Total Protein: 7.4 g/dL (ref 6.5–8.1)

## 2016-11-16 LAB — URINALYSIS, ROUTINE W REFLEX MICROSCOPIC
Bacteria, UA: NONE SEEN
Bilirubin Urine: NEGATIVE
Glucose, UA: NEGATIVE mg/dL
Ketones, ur: NEGATIVE mg/dL
Leukocytes, UA: NEGATIVE
Nitrite: POSITIVE — AB
Protein, ur: NEGATIVE mg/dL
Specific Gravity, Urine: 1.032 — ABNORMAL HIGH (ref 1.005–1.030)
pH: 6 (ref 5.0–8.0)

## 2016-11-16 LAB — LIPASE, BLOOD: Lipase: 27 U/L (ref 11–51)

## 2016-11-16 MED ORDER — CEPHALEXIN 500 MG PO CAPS: 500.0000 mg | ORAL_CAPSULE | Freq: Four times a day (QID) | ORAL | 0 refills | Status: AC

## 2016-11-16 MED ORDER — PANTOPRAZOLE SODIUM 40 MG IV SOLR
80.0000 mg | Freq: Once | INTRAVENOUS | Status: AC
  Administered 2016-11-16: 80 mg via INTRAVENOUS
  Filled 2016-11-16: qty 80

## 2016-11-16 MED ORDER — MORPHINE SULFATE (PF) 4 MG/ML IV SOLN
4.0000 mg | Freq: Once | INTRAVENOUS | Status: AC
  Administered 2016-11-16: 4 mg via INTRAVENOUS
  Filled 2016-11-16: qty 1

## 2016-11-16 MED ORDER — FAMOTIDINE IN NACL 20-0.9 MG/50ML-% IV SOLN
20.0000 mg | Freq: Once | INTRAVENOUS | Status: AC
  Administered 2016-11-16: 20 mg via INTRAVENOUS
  Filled 2016-11-16: qty 50

## 2016-11-16 MED ORDER — GI COCKTAIL ~~LOC~~
30.0000 mL | Freq: Once | ORAL | Status: AC
  Administered 2016-11-16: 30 mL via ORAL
  Filled 2016-11-16: qty 30

## 2016-11-16 MED ORDER — DEXTROSE 5 % IV SOLN
1.0000 g | Freq: Once | INTRAVENOUS | Status: AC
  Administered 2016-11-16: 1 g via INTRAVENOUS
  Filled 2016-11-16: qty 10

## 2016-11-16 MED ORDER — IOPAMIDOL (ISOVUE-300) INJECTION 61%
100.0000 mL | Freq: Once | INTRAVENOUS | Status: AC | PRN
  Administered 2016-11-16: 100 mL via INTRAVENOUS

## 2016-11-16 MED ORDER — IOPAMIDOL (ISOVUE-300) INJECTION 61%
INTRAVENOUS | Status: AC
  Filled 2016-11-16: qty 100

## 2016-11-16 NOTE — ED Provider Notes (Addendum)
[facility] DEPT Provider Note   CSN: [id number] Arrival date & time: 11/16/16  1722     History   Chief Complaint Chief Complaint  Patient presents with  . Chest Pain  . Abdominal Pain    HPI Crystal Garza is a 46 y.o. female history of DVT on xarelto, hypertension, reflux here presenting with abdominal pain, chest pain. Patient has seen her GI doctor multiple times this last year. Patient was mostly recently seen about 5 days ago. She had endoscopy as well as manometry earlier this year. Patient has not done well with Nexium so is currently on Dexilant. She also was started on carafate recently.  Patient states that her chronic epigastric pain worsened for the last 3 days. She states that the pain radiated to her chest today. She has hx of gastric sleeve done in 2013 by Dr. Hassell Done. She is taking xarelto for previous PE.   The history is provided by the patient.    Past Medical History:  Diagnosis Date  . Anemia    iron def  . Anginal pain ([place])   . Anxiety   . Arthritis    left knee   . Back pain   . Benign gastrointestinal stromal tumor (GIST)   . Chronic knee pain   . Depression   . DVT (deep venous thrombosis) ([place])   . GERD (gastroesophageal reflux disease)   . Hx of laparoscopic gastric banding   . Hypertension   . Kidney stones   . Migraine   . Morbid obesity ([place])   . PE (pulmonary embolism)     Patient Active Problem List   Diagnosis Date Noted  . Symptoms of gastroesophageal reflux 11/09/2016  . Esophageal dysphagia   . Nausea without vomiting   . Family history of colon cancer   . Dizziness 11/08/2015  . 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 Chronic neck and back pain 02/27/2015  . Other chest pain 12/10/2014  . Thrombocytosis ([place]) 08/29/2014  . Medication management 08/22/2014  . Hematuria 08/22/2014  . Pain in joint, ankle and foot 05/23/2014  . Chronic anticoagulation 05/17/2014  . Chronic thoracic back pain 04/11/2014  . Right flank pain  01/17/2014  . Degenerative arthritis of left knee 10/06/2013  . Status post total knee replacement 10/06/2013  . Edema 07/28/2013  . Chronic pain 06/13/2013  . 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 Bipolar disorder ([place]) 01/19/2012  . Nephrolithiasis 02/20/2011  . Eczematous dermatitis 10/22/2010  . Iron deficiency anemia 06/27/2010  . Anxiety 06/27/2010  . Migraine 09/12/2009  . Morbid obesity-BMI 62 10/30/2008  . Osteoarthrosis, unspecified whether generalized or localized, involving lower leg 07/05/2007  . DEPRESSION, MAJOR, RECURRENT 07/01/2006  . HYPERTENSION, BENIGN SYSTEMIC 07/01/2006  . RHINITIS, ALLERGIC 07/01/2006  . Gastroesophageal reflux disease 07/01/2006    Past Surgical History:  Procedure Laterality Date  . [place] STUDY N/A 05/13/2016   Procedure: [place] STUDY;  Surgeon: Manus Gunning, MD;  Location: [facility];  Service: Gastroenterology;  Laterality: N/A;  . ABDOMINAL HYSTERECTOMY    . BIOPSY  02/16/2012   Procedure: BIOPSY;  Surgeon: [name], MD;  Location: [facility];  Service: General;;  biopsy of mass x 2  . BREATH TEK H PYLORI  07/28/2011   Procedure: [place];  Surgeon: [name], MD;  Location: [facility];  Service: General;  Laterality: N/A;  to be done at 745  .  CESAREAN SECTION  05/1991  . COLONOSCOPY WITH PROPOFOL N/A 03/03/2016   Procedure: COLONOSCOPY WITH PROPOFOL;  Surgeon: Manus Gunning, MD;  Location: [facility];  Service: Gastroenterology;  Laterality: N/A;  . ESOPHAGEAL MANOMETRY N/A 05/13/2016   Procedure: ESOPHAGEAL MANOMETRY (EM);  Surgeon: Manus Gunning, MD;  Location: [facility];  Service: Gastroenterology;  Laterality: N/A;  . ESOPHAGOGASTRODUODENOSCOPY N/A 08/29/2012   Procedure: ESOPHAGOGASTRODUODENOSCOPY (EGD);  Surgeon: [name], MD;  Location: [facility];  Service: General;  Laterality: N/A;    . ESOPHAGOGASTRODUODENOSCOPY N/A 09/04/2014   Procedure: ESOPHAGOGASTRODUODENOSCOPY (EGD);  Surgeon: Alphonsa Overall, MD;  Location: [facility];  Service: General;  Laterality: N/A;  . ESOPHAGOGASTRODUODENOSCOPY (EGD) WITH PROPOFOL N/A 03/03/2016   Procedure: ESOPHAGOGASTRODUODENOSCOPY (EGD) WITH PROPOFOL;  Surgeon: Manus Gunning, MD;  [facility]: [facility];  Service: Gastroenterology;  Laterality: N/A;  . EUS  03/03/2012   Procedure: UPPER ENDOSCOPIC ULTRASOUND (EUS) LINEAR;  Surgeon: Milus Banister, MD;  Location: [facility];  Service: Endoscopy;  Laterality: N/A;  . gist    . KNEE ARTHROSCOPY    . LAPAROSCOPIC GASTRECTOMY  04/08/2012   Procedure: LAPAROSCOPIC GASTRECTOMY;  Surgeon: [name], MD;  Location: [facility];  Service: General;;  removal of GIST tumor of stomach  . LAPAROSCOPIC GASTRIC SLEEVE RESECTION N/A 08/29/2012   Procedure: LAPAROSCOPIC GASTRIC SLEEVE RESECTION;  Surgeon: [name], MD;  Location: [facility];  Service: General;  Laterality: N/A;  Sleeve Gastrectomy  . TOTAL KNEE ARTHROPLASTY Left 10/06/2013   Procedure: LEFT TOTAL KNEE ARTHROPLASTY;  Surgeon: Mcarthur Rossetti, MD;  Location: [facility];  Service: Orthopedics;  Laterality: Left;  . TUBAL LIGATION      OB History    No data available       Home Medications    Prior to Admission medications   Medication Sig Start Date End Date Taking? Authorizing Provider  dexlansoprazole (DEXILANT) 60 MG capsule Take 1 capsule (60 mg total) by mouth daily. 08/05/16 07/31/17 Yes Dickie La, MD  hydrOXYzine (VISTARIL) 25 MG capsule Take 1 capsule (25 mg total) by mouth at bedtime. 08/07/15  Yes Arfeen, Arlyce Harman, MD  lamoTRIgine (LAMICTAL) 200 MG tablet Take 1 tablet (200 mg total) by mouth at bedtime. 08/05/16  Yes Dickie La, MD  lisinopril (PRINIVIL,ZESTRIL) 10 MG tablet Take 1 tablet (10 mg total) by mouth daily. 04/15/16  Yes Dickie La, MD  ondansetron (ZOFRAN-ODT) 4 MG disintegrating tablet Take 4 mg  by mouth every 8 (eight) hours as needed for nausea.  01/23/16  Yes [provider]  oxyCODONE-acetaminophen (PERCOCET) 7.5-325 MG tablet Take one tablet by mouth every 8 hours as needed for pain do not fill before November 03 2016 09/30/16  Yes Dickie La, MD  risperiDONE (RISPERDAL) 1 MG tablet Take 1 tablet (1 mg total) by mouth at bedtime. 08/05/16  Yes Dickie La, MD  rivaroxaban (XARELTO) 20 MG TABS tablet Take 1 tablet (20 mg total) by mouth daily with supper. 08/05/16  Yes Dickie La, MD  sucralfate (CARAFATE) 1 GM/10ML suspension Take 10 mLs (1 g total) by mouth 4 (four) times daily -  with meals and at bedtime. 11/11/16  Yes Levin Erp, PA  tiZANidine (ZANAFLEX) 4 MG tablet Take 1 tablet (4 mg total) by mouth 2 (two) times daily as needed for muscle spasms. 08/05/16  Yes Dickie La, MD  ibuprofen (ADVIL,MOTRIN) 600 MG tablet Take 1 tablet (600 mg total) by mouth every 6 (six)  hours as needed. Patient not taking: Reported on 11/16/2016 09/13/16   Charlann Lange, PA-C  meclizine (ANTIVERT) 32 MG tablet Take 1 tablet (32 mg total) by mouth 3 (three) times daily as needed. Patient not taking: Reported on 11/16/2016 11/08/15   Nicolette Bang, DO  mupirocin ointment Drue Stager) 2 % Apply t o affected skin area twice daily as directed Patient not taking: Reported on 11/16/2016 02/19/16   Dickie La, MD  nystatin cream (MYCOSTATIN) APPLY TOPICALLY 2 TIMES A DAY Patient taking differently: Apply 1 application topically 2 (two) times daily as needed for dry skin.  08/05/16   Dickie La, MD  predniSONE (DELTASONE) 20 MG tablet Take 2 tablets (40 mg total) by mouth daily. Patient not taking: Reported on 11/16/2016 10/06/16   Davonna Belling, MD    Family History Family History  Problem Relation Age of Onset  . Diabetes Mother   . Diabetes Father   . Heart disease Father   . Colon cancer Father        brain tumor  . Colon polyps Father   . Colitis Father   . Irritable  bowel syndrome Father   . Diabetes Maternal Grandmother   . Diabetes Maternal Grandfather   . Schizophrenia Maternal Grandfather   . Heart disease Paternal Uncle   . Diabetes Paternal Uncle   . Heart disease Paternal Grandmother   . Heart disease Paternal Grandfather   . Diabetes Maternal Aunt   . Diabetes Maternal Uncle   . Diabetes Paternal Aunt     Social History Social History  Substance Use Topics  . Smoking status: Never Smoker  . Smokeless tobacco: Never Used  . Alcohol use 0.0 oz/week     Comment: maybe once a month     Allergies   Codeine   Review of Systems Review of Systems  Cardiovascular: Positive for chest pain.  Gastrointestinal: Positive for abdominal pain.  All other systems reviewed and are negative.    Physical Exam Updated Vital Signs BP 114/80 (BP Location: Right Arm)   Pulse 76   Temp 98.5 F (36.9 C) (Oral)   Resp (!) 21   Ht 5\' 7"  (1.702 m)   Wt (!) 152 kg (335 lb)   SpO2 100%   BMI 52.47 kg/m   Physical Exam  Constitutional: She is oriented to person, place, and time.  Overweight, slightly uncomfortable   HENT:  Head: Normocephalic.  Mouth/Throat: Oropharynx is clear and moist.  Eyes: Pupils are equal, round, and reactive to light. Conjunctivae and EOM are normal.  Neck: Normal range of motion. Neck supple.  Cardiovascular: Normal rate, regular rhythm and normal heart sounds.   Pulmonary/Chest: Effort normal and breath sounds normal. No respiratory distress. She has no wheezes. She has no rales.  Abdominal: Soft. Bowel sounds are normal.  Mild periumbilical tenderness   Musculoskeletal: Normal range of motion.  Neurological: She is alert and oriented to person, place, and time. No cranial nerve deficit. Coordination normal.  Skin: Skin is warm.  Psychiatric: She has a normal mood and affect.  Nursing note and vitals reviewed.    ED Treatments / Results  Labs (all labs ordered are listed, but only abnormal results are  displayed) Labs Reviewed  CBC - Abnormal; Notable for the following:       Result Value   WBC 13.1 (*)    Platelets 439 (*)    All other components within normal limits  HEPATIC FUNCTION PANEL - Abnormal; Notable for the  following:    Albumin 3.4 (*)    ALT 11 (*)    All other components within normal limits  URINALYSIS, ROUTINE W REFLEX MICROSCOPIC - Abnormal; Notable for the following:    APPearance HAZY (*)    Specific Gravity, Urine 1.032 (*)    Hgb urine dipstick MODERATE (*)    Nitrite POSITIVE (*)    Squamous Epithelial / LPF 6-30 (*)    All other components within normal limits  URINE CULTURE  BASIC METABOLIC PANEL  LIPASE, BLOOD  I-STAT TROPOININ, ED  POCT I-STAT TROPONIN I  I-STAT TROPOININ, ED  POCT I-STAT TROPONIN I    EKG  EKG Interpretation  Date/Time:  Monday November 16 2016 17:33:15 EDT Ventricular Rate:  101 PR Interval:    QRS Duration: 77 QT Interval:  325 QTC Calculation: 422 R Axis:   57 Text Interpretation:  Sinus tachycardia Ventricular premature complex Low voltage, precordial leads ST elevation, consider inferior injury Baseline wander in lead(s) II III aVF V1 V2 V6 poor baseline, will repeat  Confirmed by Wandra Arthurs [phone]) on 11/16/2016 7:51:35 PM       Radiology Dg Chest 2 View  Result Date: 11/16/2016 CLINICAL DATA:  Three day history is of chest pain EXAM: CHEST  2 VIEW COMPARISON:  10/05/2016 FINDINGS: The heart size and mediastinal contours are within normal limits. Both lungs are clear. The visualized skeletal structures are unremarkable. IMPRESSION: No active cardiopulmonary disease. Electronically Signed   By: Misty Stanley M.D.   On: 11/16/2016 18:21   Ct Abdomen Pelvis W Contrast  Result Date: 11/16/2016 CLINICAL DATA:  Abdominal pain and chest pain EXAM: CT ABDOMEN AND PELVIS WITH CONTRAST TECHNIQUE: Multidetector CT imaging of the abdomen and pelvis was performed using the standard protocol following bolus administration of  intravenous contrast. CONTRAST:  164mL ISOVUE-300 IOPAMIDOL (ISOVUE-300) INJECTION 61% COMPARISON:  CT abdomen pelvis 09/24/2015 FINDINGS: Lower chest: No pulmonary nodules. No visible pleural or pericardial effusion. Hepatobiliary: Normal hepatic size and contours without focal liver lesion. No perihepatic ascites. No intra- or extrahepatic biliary dilatation. Normal gallbladder. Pancreas: Normal pancreatic contours and enhancement. No peripancreatic fluid collection or pancreatic ductal dilatation. Spleen: Normal. Adrenals/Urinary Tract: Normal adrenal glands. No hydronephrosis or solid renal mass. Stomach/Bowel: There is no hiatal hernia. The patient is status post gastric sleeve procedure. There is no focal abnormality at the operative site. There is no dilated small bowel or enteric inflammation. There is no colonic abnormality. The appendix is normal. Vascular/Lymphatic: Normal course and caliber of the major abdominal vessels. No abdominal or pelvic adenopathy. Reproductive: Status post hysterectomy.  2 cm right ovarian cyst. Musculoskeletal: No bony spinal canal stenosis. Normal visualized extrathoracic and extraperitoneal soft tissues. Other: No contributory non-categorized findings. IMPRESSION: No acute abnormality of the abdomen or pelvis. Electronically Signed   By: Ulyses Jarred M.D.   On: 11/16/2016 21:35    Procedures Procedures (including critical care time)  Medications Ordered in ED Medications  iopamidol (ISOVUE-300) 61 % injection (not administered)  cefTRIAXone (ROCEPHIN) 1 g in dextrose 5 % 50 mL IVPB (1 g Intravenous New Bag/Given 11/16/16 2232)  pantoprazole (PROTONIX) 80 mg in sodium chloride 0.9 % 100 mL IVPB (0 mg Intravenous Stopped 11/16/16 2153)  gi cocktail (Maalox,Lidocaine,Donnatal) (30 mLs Oral Given 11/16/16 2026)  famotidine (PEPCID) IVPB 20 mg premix (0 mg Intravenous Stopped 11/16/16 2100)  morphine 4 MG/ML injection 4 mg (4 mg Intravenous Given 11/16/16 2026)   iopamidol (ISOVUE-300) 61 % injection 100 mL (100 mLs  Intravenous Contrast Given 11/16/16 2052)  morphine 4 MG/ML injection 4 mg (4 mg Intravenous Given 11/16/16 2229)     Initial Impression / Assessment and Plan / ED Course  I have reviewed the triage vital signs and the nursing notes.  Pertinent labs & imaging results that were available during my care of the patient were reviewed by me and considered in my medical decision making (see chart for details).    ERRYN DICKISON is a 46 y.o. female here with chest pain, abdominal pain. Has been seeing GI and had endoscopy, previous CT, manometry and don't really know why she has this pain. 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 Does have gastric sleeve and hasn't been following up surgery. Will get labs, UA, CT ab/pel, trop x 2. I doubt PE as patient is on xarelto. Will give protonix, IVF, GI cocktail and reassess.   10:54 PM UA + leuk, some WBC. WBC 13. CT ab/pel unremarkable. Trop neg x 2. Pain improved. Likely multi factorial. Will dc home with keflex. Will have her follow up with PCP, GI, general surgery. Of note, patient refilled 90 oxycodone 7.5 mg on 7/3 prescribed by PCP. Will not prescribe any narcotics.   Final Clinical Impressions(s) / ED Diagnoses   Final diagnoses:  None    New Prescriptions New Prescriptions   No medications on file     Drenda Freeze, MD 11/16/16 2255    Drenda Freeze, MD 11/16/16 2259

## 2016-11-16 NOTE — ED Triage Notes (Signed)
Pt states that she has had abdominal pain x 3 days and chest pain starting today. Taking carafate at home w/o relief. Alert and oriented.

## 2016-11-16 NOTE — Discharge Instructions (Signed)
Take keflex four times daily for a week.   Continue your current meds including your pain medicines.   See your primary care doctor, GI doctor, surgeon for follow up next week   Return to ER if you have worse abdominal pain, chest pain, fever, trouble breathing.

## 2016-11-17 NOTE — Telephone Encounter (Signed)
I called Crystal Garza, she is having ongoing chronic symptoms for which she has been evaluated in the past. Burning, reflux symptoms in her chest, and postprandial epigastric pain. Prior extensive workup with PH testing, manometry, and EGD normal. I suspect she is having functional heartburn in regards to her chest symptoms. In regards to abdominal pain, she is on dexilant daily, I doubt she is having PUD. She has had a prior US showing no gallstones, but could be having gallbladder dyskinesia, I would like to order a HIDA scan. Functional dyspepsia is also possible.  I discussed potentially adding a TCA to her regimen such as desipramine, but she is on risperdal. I will reach out to Dr. Nori Riis her primary care to see if this is safe to add.  Otherwise, [name], can you please coordinate a HIDA scan for her? We can also recommend she use some FD gard as needed to try, we can give her samples from the office if she wants to pick some up. I will consider starting Desipramine for her once I hear back from Dr. Nori Riis. Thanks

## 2016-11-17 NOTE — Telephone Encounter (Signed)
Spoke to patient she is still having abdominal pain, she states it hurts more when she eats. Carafate is not helping, she does not have an appointment until 8/23, unfortunately there is not an available spot to work her in. Please advise, thanks.

## 2016-11-17 NOTE — Telephone Encounter (Signed)
Patient went to ED yesterday, please note, lab and CT. Thanks.

## 2016-11-17 NOTE — Telephone Encounter (Signed)
CT and labs are normal other than mild elevation in WBC. They were treating her for UTI I think? She has been seen for this issue multiple times - based on her workup with [name] I think she has functional symptoms unless something has changed. She can see me in follow up to discuss options (TCA)

## 2016-11-18 ENCOUNTER — Other Ambulatory Visit: Payer: Self-pay

## 2016-11-18 DIAGNOSIS — G8929 Other chronic pain: Secondary | ICD-10-CM

## 2016-11-18 DIAGNOSIS — R1013 Epigastric pain: Principal | ICD-10-CM

## 2016-11-18 NOTE — Telephone Encounter (Signed)
Left message for Manuela Schwartz at centralized scheduling that need a HIDA scan scheduled, asked that she call back to complete this.

## 2016-11-18 NOTE — Telephone Encounter (Signed)
Spoke to patient, let her know that the HIDA scan is scheduled for 7/26 at [facility], NPO after midnight and to not take any pain medications 6 hours prior. We do not have any samples of FD gard, but told the patient she could get this OTC and if we get samples in I would call her.

## 2016-11-19 LAB — URINE CULTURE: Culture: >=100000 — AB

## 2016-11-20 ENCOUNTER — Telehealth: Payer: Self-pay

## 2016-11-20 NOTE — Telephone Encounter (Signed)
Post ED Visit - Positive Culture Follow-up  Culture report reviewed by antimicrobial stewardship pharmacist:  []  Elenor Quinones, Pharm.D. []  Heide Guile, Pharm.D., BCPS AQ-ID []  [place]ks [place]une, Pharm.D., BCPS []  Alycia Rossetti, Pharm.D., BCPS []  [place], Pharm.D., BCPS, AAHIVP [x]  Legrand Como, Pharm.D., BCPS, AAHIVP []  Salome Arnt, PharmD, BCPS []  Dimitri Ped, PharmD, BCPS []  Vincenza Hews, PharmD, BCPS  Positive urine culture Treated with Cephalexin, organism sensitive to the same and no further patient follow-up is required at this time.  Genia Del 11/20/2016, 12:58 PM

## 2016-11-24 ENCOUNTER — Other Ambulatory Visit: Payer: Self-pay

## 2016-11-24 ENCOUNTER — Telehealth: Payer: Self-pay

## 2016-11-24 MED ORDER — DESIPRAMINE HCL 10 MG PO TABS: 10.0000 mg | ORAL_TABLET | Freq: Every day | ORAL | 0 refills | Status: DC

## 2016-11-24 NOTE — Progress Notes (Signed)
done

## 2016-11-24 NOTE — Telephone Encounter (Signed)
Spoke to patient about new medication that Dr. Havery Moros and Dr. Nori Riis discussed. I have sent in this new Rx to her pharmacy. She understands to let us know in one month how she is doing on the 10 mg dosage, let her know that it could be adjusted to 20 mg if needed. Called in only #30 as a trial dose.

## 2016-11-24 NOTE — Telephone Encounter (Signed)
-----   Message from Manus Gunning, MD sent at 11/23/2016  5:06 PM EDT ----- Regarding: FW: mutual patient [name] can you please contact this patient. I spoke with Dr. Nori Riis, she is okay with me starting desipramine 10mg  qHS for functional heartburn. I would try this dose for a month, maybe increase to 20mg  at that time if she tolerates it. Thanks  ----- Message ----- From: Dickie La, MD Sent: 11/23/2016   4:43 PM To: Manus Gunning, MD Subject: RE: mutual patient                             I think a low dose---10-30 mg a day would likely not interfere with her risperidone. Higher doses--I would have to check with one of our pharmacists. Clarise Cruz ----- Message ----- From: Manus Gunning, MD Sent: 11/17/2016   7:33 PM To: Dickie La, MD Subject: mutual patient                                 Hello, We share this patient who is having ongoing reflux and dyspepsia despite high dose PPI. I have done an extensive evaluation on her, pH test shows her reflux is well controlled on PPI. I think she may be having functional symptoms and was considering starting her on a TCA such as desipramine, but noted she is taking risperdol. Would it be okay to start a TCA for her on this regimen, or would you prefer me to hold off? Thanks for your opinion.  Richardson Landry

## 2016-11-26 ENCOUNTER — Encounter (HOSPITAL_COMMUNITY)
Admission: RE | Admit: 2016-11-26 | Discharge: 2016-11-26 | Disposition: A | Discharge status: Home / Self Care | Payer: Medicare Other | Source: Ambulatory Visit | Attending: Gastroenterology | Admitting: Gastroenterology

## 2016-11-26 DIAGNOSIS — R1013 Epigastric pain: Secondary | ICD-10-CM | POA: Insufficient documentation

## 2016-11-26 DIAGNOSIS — G8929 Other chronic pain: Secondary | ICD-10-CM | POA: Insufficient documentation

## 2016-11-26 DIAGNOSIS — R109 Unspecified abdominal pain: Secondary | ICD-10-CM | POA: Diagnosis not present

## 2016-11-26 DIAGNOSIS — R112 Nausea with vomiting, unspecified: Secondary | ICD-10-CM | POA: Diagnosis not present

## 2016-11-26 MED ORDER — TECHNETIUM TC 99M MEBROFENIN IV KIT
5.4000 | PACK | Freq: Once | INTRAVENOUS | Status: AC | PRN
  Administered 2016-11-26: 5.4 via INTRAVENOUS

## 2016-11-30 ENCOUNTER — Telehealth: Payer: Self-pay | Admitting: Gastroenterology

## 2016-11-30 NOTE — Telephone Encounter (Signed)
Patient is upset that no one told her that this medication is anti-depressant classification. Looking at phone note on 11/16/16 this was discussed with patient. Also let her know that Dr. Nori Riis is aware of this medication. She has appointment on 8/23 with Dr. Havery Moros and told her she could discuss it further with him then.

## 2016-11-30 NOTE — Telephone Encounter (Signed)
Called patient - I clarified why we were giving her the TCA, that it is an antidepressant but we are not using it for that property - hoping it will a good treatment for functional dyspepsia / heartburn to reduce her hypersensitivity. Following this discussion she verbalized understanding and agreed to proceed with it. If tolerating 10mg  qHS for one month, can increase to 20mg .

## 2016-12-02 ENCOUNTER — Ambulatory Visit (INDEPENDENT_AMBULATORY_CARE_PROVIDER_SITE_OTHER): Payer: Medicare Other | Admitting: Family Medicine

## 2016-12-02 ENCOUNTER — Encounter: Payer: Self-pay | Admitting: Family Medicine

## 2016-12-02 VITALS — HR 69 | Temp 97.9°F | Ht 67.0 in | Wt 340.6 lb

## 2016-12-02 DIAGNOSIS — G894 Chronic pain syndrome: Secondary | ICD-10-CM

## 2016-12-02 DIAGNOSIS — E538 Deficiency of other specified B group vitamins: Secondary | ICD-10-CM | POA: Diagnosis not present

## 2016-12-02 MED ORDER — OXYCODONE-ACETAMINOPHEN 7.5-325 MG PO TABS: ORAL_TABLET | ORAL | 0 refills | Status: DC

## 2016-12-02 MED ORDER — POLYETHYLENE GLYCOL 3350 17 GM/SCOOP PO POWD: ORAL | 4 refills | Status: DC

## 2016-12-02 NOTE — Patient Instructions (Signed)
Start the moralax at 1 or 2 tablespoons a day and gradually work up to 1-2 scoops a day over several weeks. Take it EVERY day.  Let me see you in 3 months I will send you a note about your blood work

## 2016-12-02 NOTE — Assessment & Plan Note (Signed)
[place] for narcotics was reviewed no red flags. Refills for 3 months of her pain medicine is given.

## 2016-12-02 NOTE — Progress Notes (Signed)
    CHIEF COMPLAINT / HPI:  Continues to have some diffuse abdominal pain. She has appointment with her general surgeon who did her gastric sleeve procedure a few years ago. Pain is diffuse, intermittently sharp. Present most of the day. Not relieved or worsened by any specific activity or food. No nausea or vomiting. No unusual weight loss. #2. Doing well with her current pain medicine regimen for her osteoporosis bilateral knees and low back. Needs refills.  REVIEW OF SYSTEMS:  See history of present illness.  OBJECTIVE:  Vital signs are reviewed.  Vital signs reviewed GENERALl: Well developed, well nourished, in no acute distress. HEENT: PERRLA, EOMI, sclerae are nonicteric NECK: Supple, FROM, without lymphadenopathy.  THYROID: normal without nodularity CAROTID ARTERIES: without bruits LUNGS: clear to auscultation bilaterally. No wheezes or rales. Normal respiratory effort HEART: Regular rate and rhythm, no murmurs. Distal pulses are bilaterally symmetrical, 2+. ABDOMEN: soft with positive bowel sounds. No masses noted MSK: MOE x 4. Normal muscle strength, bulk and tone. SKIN no rash. Normal temperature. NEURO: no focal deficits. Normal gait. Normal balance.   ASSESSMENT / PLAN: Please see problem oriented charting for details

## 2016-12-03 ENCOUNTER — Encounter: Payer: Self-pay | Admitting: Allergy & Immunology

## 2016-12-03 ENCOUNTER — Other Ambulatory Visit: Payer: Self-pay | Admitting: Allergy & Immunology

## 2016-12-03 ENCOUNTER — Ambulatory Visit (INDEPENDENT_AMBULATORY_CARE_PROVIDER_SITE_OTHER): Payer: Medicare Other | Admitting: Allergy & Immunology

## 2016-12-03 VITALS — HR 89 | Temp 98.3°F | Resp 19 | Ht 67.0 in | Wt 338.6 lb

## 2016-12-03 DIAGNOSIS — J302 Other seasonal allergic rhinitis: Secondary | ICD-10-CM | POA: Diagnosis not present

## 2016-12-03 DIAGNOSIS — J3089 Other allergic rhinitis: Secondary | ICD-10-CM

## 2016-12-03 DIAGNOSIS — L500 Allergic urticaria: Secondary | ICD-10-CM

## 2016-12-03 DIAGNOSIS — T781XXD Other adverse food reactions, not elsewhere classified, subsequent encounter: Secondary | ICD-10-CM

## 2016-12-03 LAB — VITAMIN B12: Vitamin B-12: 253 pg/mL (ref 232–1245)

## 2016-12-03 MED ORDER — EPINEPHRINE 0.3 MG/0.3ML IJ SOAJ: 0.3000 mg | Freq: Once | INTRAMUSCULAR | 2 refills | Status: AC

## 2016-12-03 NOTE — Progress Notes (Signed)
NEW PATIENT  Date of Service/Encounter:  12/03/16  Referring provider: Dickie La, MD   Assessment:   Allergic urticaria  Adverse food reaction - with negative testing today  Seasonal and perennial allergic rhinitis (trees, weeds, grasses, molds, dust mites and cat)  Plan/Recommendations:   1. Allergic reactions with chronic hives - Testing to all of the foods we tested was negative (Peanut, Soy, Wheat, Corn, Milk, Egg, Casein, Shellfish Mix, Fish Mix, Cashew, [place], [place], [place], [place], [place], [place], [place], [place], [place], [place], [place], [place], [place], [place], [place], [place], [place] and [place] nut) - There is a the low positive predictive value of food allergy testing and hence the high possibility of false positives. - In contrast, food allergy testing has a high negative predictive value, therefore if testing is negative we can be relatively assured that they are indeed negative.  - We will get testing to look for a coffee allergy level and a seafood panel allergy level. - Your history does not have any "red flags" such as fevers, joint pains, or permanent skin changes that would be concerning for a more serious cause of hives.  - We will get some labs to rule out serious causes of hives: tryptase level, chronic urticaria panel, ANA, ESR, and CRP. - Chronic hives are often times a self limited process and will "burn themselves out" over 6-12 months, although this is not always the case.  - In the meantime, start suppressive dosing of antihistamines:   - Morning: Zyrtec (cetirizine) 10-64m (tne to two tablets)  - Evening: Zyrtec (cetirizine) 10-254m(one to two tablets) - You can change this dosing at home, decreasing the dose as needed or increasing the dosing as needed.  - If you are not tolerating the medications or are tired of taking them every day, we can start treatment with a monthly injectable medication called Xolair.   2. Perennial allergic rhinitis -  Testing today showed: trees, weeds, grasses, molds, dust mites and cat - Avoidance measures provided. - Continue with Flonase (fluticasone) two sprays per nostril daily - Start Zyrtec (cetirizine) 1018mn the morning and 30m44m night - Consider nasal saline rinses 1-2 times daily to remove allergens from the nasal cavities as well as help with mucous clearance (this is especially helpful to do before the nasal sprays are given) - Consider allergy shots as a means of long-term control. - Allergy shots "re-train" the immune system to ignore environmental allergens and decrease the resulting immune response to those allergens (sneezing, itchy watery eyes, runny nose, nasal congestion, etc).   - We can discuss more at the next appointment if the medications are not working for you.  3. Return in about 3 months (around 03/05/2017).   Subjective:   Crystal COWLEYa 46 y47. female presenting today for evaluation of  Chief Complaint  Patient presents with  . Allergic Reaction  . Pruritus    Crystal Garza has a history of the following: Patient Active Problem List   Diagnosis Date Noted  . Symptoms of gastroesophageal reflux 11/09/2016  . Esophageal dysphagia   . Nausea without vomiting   . Family history of colon cancer   . Dizziness 11/08/2015  . 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 Chronic neck and back pain 02/27/2015  . Other chest pain 12/10/2014  . Thrombocytosis (HCC)[place]/27/2016  . Medication management 08/22/2014  . Hematuria 08/22/2014  . Pain in joint, ankle and foot 05/23/2014  . Chronic anticoagulation 05/17/2014  . Chronic thoracic  back pain 04/11/2014  . Right flank pain 01/17/2014  . Degenerative arthritis of left knee 10/06/2013  . Status post total knee replacement 10/06/2013  . Edema 07/28/2013  . Chronic pain 06/13/2013  . 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 Bipolar disorder ([place])  01/19/2012  . Nephrolithiasis 02/20/2011  . Eczematous dermatitis 10/22/2010  . Iron deficiency anemia 06/27/2010  . Anxiety 06/27/2010  . Migraine 09/12/2009  . Morbid obesity-BMI 62 10/30/2008  . Osteoarthrosis, unspecified whether generalized or localized, involving lower leg 07/05/2007  . DEPRESSION, MAJOR, RECURRENT 07/01/2006  . HYPERTENSION, BENIGN SYSTEMIC 07/01/2006  . RHINITIS, ALLERGIC 07/01/2006  . Gastroesophageal reflux disease 07/01/2006    History obtained from: chart review and patient.  Crystal Garza was referred by Dickie La, MD.     Crystal Garza is a 46 y.o. female presenting with concern for food allergies. This first became an issue in June when she ate some tuna. She was seen in the ER in June 2018 for an allergic reaction. She was eating tuna and then 30 minutes later she really developed itching with subsequent shortness of breath. She also developed some vomiting with esophageal burning. In the ER, she received 1 dose of Benadryl, famotidine, and prednisone. She has avoided all fish since that time.   Since that time, she has been having reactions to other foods, including coffee and eggs. She had a chicken sandwich with "spicy sauce" with resulting itching. She ate the same thing last week without any reactions. Her reactions include breaking out, hives, and sweating with throat swelling. She develops nasal drainage with postnasal drip. Symptoms last 30-45 minutes. She did not take benadryl today. All of the reactions seem to be associated with foods. She does eat peanut, tree nuts, wheat, eggs, dairy. She no avoids seafood. She does tolerate soy sauce.   She does have a history of environmental allergies including itchy watery eyes. She does take Flonase daily as needed. She has only been using the Benadryl as needed. She does not take an antihistamine on a daily basis. She has never been tested for allergies.   She had a gastric sleeve placed in 2014 for her  gastric system. Her last endoscopy and colonoscopy were performed in October 2017 and were normal per the patient. Review of the note shows that there was some esophagitis as well as nodularity and a 2cm hiatal hernia. Biopsies were negative for H pylori. She remains on Dexilant as well as Carafate PRN. She never got an EpiPen at all. She was stung by a bee 10 years ago and she had left sided large local swelling around the elbow.   Otherwise, there is no history of other atopic diseases, including drug allergies, stinging insect allergies, or urticaria. There is no significant infectious history. Vaccinations are up to date.    Past Medical History: Patient Active Problem List   Diagnosis Date Noted  . Symptoms of gastroesophageal reflux 11/09/2016  . Esophageal dysphagia   . Nausea without vomiting   . Family history of colon cancer   . Dizziness 11/08/2015  . 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 Chronic neck and back pain 02/27/2015  . Other chest pain 12/10/2014  . Thrombocytosis ([place]) 08/29/2014  . Medication management 08/22/2014  . Hematuria 08/22/2014  . Pain in joint, ankle and foot 05/23/2014  . Chronic anticoagulation 05/17/2014  . Chronic thoracic back pain 04/11/2014  . Right flank pain 01/17/2014  .  Degenerative arthritis of left knee 10/06/2013  . Status post total knee replacement 10/06/2013  . Edema 07/28/2013  . Chronic pain 06/13/2013  . 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 Bipolar disorder ([place]) 01/19/2012  . Nephrolithiasis 02/20/2011  . Eczematous dermatitis 10/22/2010  . Iron deficiency anemia 06/27/2010  . Anxiety 06/27/2010  . Migraine 09/12/2009  . Morbid obesity-BMI 62 10/30/2008  . Osteoarthrosis, unspecified whether generalized or localized, involving lower leg 07/05/2007  . DEPRESSION, MAJOR, RECURRENT 07/01/2006  . HYPERTENSION, BENIGN SYSTEMIC 07/01/2006  . RHINITIS, ALLERGIC  07/01/2006  . Gastroesophageal reflux disease 07/01/2006    Medication List:  Allergies as of 12/03/2016      Reactions   Codeine Rash      Medication List       Accurate as of 12/03/16  8:26 PM. Always use your most recent med list.          desipramine 10 MG tablet Commonly known as:  NORPRAMIN Take 1 tablet (10 mg total) by mouth at bedtime.   dexlansoprazole 60 MG capsule Commonly known as:  DEXILANT Take 1 capsule (60 mg total) by mouth daily.   EPINEPHrine 0.3 mg/0.3 mL [place] injection Commonly known as:  EPI-PEN Inject 0.3 mLs (0.3 mg total) into the muscle once.   hydrOXYzine 25 MG capsule Commonly known as:  VISTARIL Take 1 capsule (25 mg total) by mouth at bedtime.   ibuprofen 600 MG tablet Commonly known as:  ADVIL,MOTRIN Take 1 tablet (600 mg total) by mouth every 6 (six) hours as needed.   lamoTRIgine 200 MG tablet Commonly known as:  LAMICTAL Take 1 tablet (200 mg total) by mouth at bedtime.   lisinopril 10 MG tablet Commonly known as:  PRINIVIL,ZESTRIL Take 1 tablet (10 mg total) by mouth daily.   meclizine 32 MG tablet Commonly known as:  ANTIVERT Take 1 tablet (32 mg total) by mouth 3 (three) times daily as needed.   methenamine 1 g tablet Commonly known as:  HIPREX   mupirocin ointment 2 % Commonly known as:  BACTROBAN Apply t o affected skin area twice daily as directed   nystatin cream Commonly known as:  MYCOSTATIN APPLY TOPICALLY 2 TIMES A DAY   ondansetron 4 MG disintegrating tablet Commonly known as:  ZOFRAN-ODT Take 4 mg by mouth every 8 (eight) hours as needed for nausea.   oxyCODONE-acetaminophen 7.5-325 MG tablet Commonly known as:  PERCOCET Take one tablet by mouth every 8 hours as needed for pain do not fill before February 03 2017   polyethylene glycol powder powder Commonly known as:  GLYCOLAX/MIRALAX Taper up to 1 or 2 scoops daily   predniSONE 20 MG tablet Commonly known as:  DELTASONE Take 2 tablets (40 mg total)  by mouth daily.   risperiDONE 1 MG tablet Commonly known as:  RISPERDAL Take 1 tablet (1 mg total) by mouth at bedtime.   rivaroxaban 20 MG Tabs tablet Commonly known as:  XARELTO Take 1 tablet (20 mg total) by mouth daily with supper.   sucralfate 1 GM/10ML suspension Commonly known as:  CARAFATE Take 10 mLs (1 g total) by mouth 4 (four) times daily -  with meals and at bedtime.   tiZANidine 4 MG tablet Commonly known as:  ZANAFLEX Take 1 tablet (4 mg total) by mouth 2 (two) times daily as needed for muscle spasms.   VESICARE 5 MG tablet Generic drug:  solifenacin  Birth History: non-contributory.    Developmental History: non-contributory.   Past Surgical History: Past Surgical History:  Procedure Laterality Date  . [place] STUDY N/A 05/13/2016   Procedure: [place] STUDY;  Surgeon: Manus Gunning, MD;  Location: [facility];  Service: Gastroenterology;  Laterality: N/A;  . ABDOMINAL HYSTERECTOMY    . BIOPSY  02/16/2012   Procedure: BIOPSY;  Surgeon: [name], MD;  Location: [facility];  Service: General;;  biopsy of mass x 2  . BREATH TEK H PYLORI  07/28/2011   Procedure: [place];  Surgeon: [name], MD;  Location: [facility];  Service: General;  Laterality: N/A;  to be done at 745  . CESAREAN SECTION  05/1991  . COLONOSCOPY WITH PROPOFOL N/A 03/03/2016   Procedure: COLONOSCOPY WITH PROPOFOL;  Surgeon: Manus Gunning, MD;  [facility]: [facility];  Service: Gastroenterology;  Laterality: N/A;  . ESOPHAGEAL MANOMETRY N/A 05/13/2016   Procedure: ESOPHAGEAL MANOMETRY (EM);  Surgeon: Manus Gunning, MD;  [facility]: [facility];  Service: Gastroenterology;  Laterality: N/A;  . ESOPHAGOGASTRODUODENOSCOPY N/A 08/29/2012   Procedure: ESOPHAGOGASTRODUODENOSCOPY (EGD);  Surgeon: [name], MD;  [facility]: [facility];  Service: General;  Laterality: N/A;  . ESOPHAGOGASTRODUODENOSCOPY N/A 09/04/2014   Procedure:  ESOPHAGOGASTRODUODENOSCOPY (EGD);  Surgeon: Alphonsa Overall, MD;  [facility]: [facility];  Service: General;  Laterality: N/A;  . ESOPHAGOGASTRODUODENOSCOPY (EGD) WITH PROPOFOL N/A 03/03/2016   Procedure: ESOPHAGOGASTRODUODENOSCOPY (EGD) WITH PROPOFOL;  Surgeon: Manus Gunning, MD;  [facility];  Service: Gastroenterology;  Laterality: N/A;  . EUS  03/03/2012   Procedure: UPPER ENDOSCOPIC ULTRASOUND (EUS) LINEAR;  Surgeon: Milus Banister, MD;  Location[facility];  Service: Endoscopy;  Laterality: N/A;  . gist    . KNEE ARTHROSCOPY    . LAPAROSCOPIC GASTRECTOMY  04/08/2012   Procedure: LAPAROSCOPIC GASTRECTOMY;  Surgeon: [name], MD;  Location[facility];  Service: General;;  removal of GIST tumor of stomach  . LAPAROSCOPIC GASTRIC SLEEVE RESECTION N/A 08/29/2012   Procedure: LAPAROSCOPIC GASTRIC SLEEVE RESECTION;  Surgeon: [name], MD;  [facility];  Service: General;  Laterality: N/A;  Sleeve Gastrectomy  . TOTAL KNEE ARTHROPLASTY Left 10/06/2013   Procedure: LEFT TOTAL KNEE ARTHROPLASTY;  Surgeon: Mcarthur Rossetti, MD;  Location: [facility];  Service: Orthopedics;  Laterality: Left;  . TUBAL LIGATION       Family History: Family History  Problem Relation Age of Onset  . Diabetes Mother   . Diabetes Father   . Heart disease Father   . Colon cancer Father        brain tumor  . Colon polyps Father   . Colitis Father   . Irritable bowel syndrome Father   . Diabetes Maternal Grandmother   . Diabetes Maternal Grandfather   . Schizophrenia Maternal Grandfather   . Heart disease Paternal Uncle   . Diabetes Paternal Uncle   . Heart disease Paternal Grandmother   . Heart disease Paternal Grandfather   . Diabetes Maternal Aunt   . Diabetes Maternal Uncle   . Diabetes Paternal Aunt      Social History: Yarielys lives at home with her family. 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 She lives in a 48 year old  apartment. There are concrete floors in the main living area and some tiling in the bedrooms. They have electric heating and central cooling. There are no animals inside or  outside of the home. She does have dust mite covers on her bedding. There is tobacco exposure.    Review of Systems: a 14-point review of systems is pertinent for what is mentioned in HPI.  Otherwise, all other systems were negative. Constitutional: negative other than that listed in the HPI Eyes: negative other than that listed in the HPI Ears, nose, mouth, throat, and face: negative other than that listed in the HPI Respiratory: negative other than that listed in the HPI Cardiovascular: negative other than that listed in the HPI Gastrointestinal: negative other than that listed in the HPI Genitourinary: negative other than that listed in the HPI Integument: negative other than that listed in the HPI Hematologic: negative other than that listed in the HPI Musculoskeletal: negative other than that listed in the HPI Neurological: negative other than that listed in the HPI Allergy/Immunologic: negative other than that listed in the HPI    Objective:   Pulse 89, temperature 98.3 F (36.8 C), resp. rate 19, height _0  (1.702 m), weight (!) 338 lb 9.6 oz (153.6 kg), SpO2 94 %. Body mass index is 53.03 kg/m.   Physical Exam:  General: Alert, interactive, in no acute distress. Obese female. Very pleasant and talkative.  Eyes: No conjunctival injection present on the right, No conjunctival injection present on the left, PERRL bilaterally, No discharge on the right, No discharge on the left and No Horner-Trantas dots present Ears: Right TM pearly gray with normal light reflex, Left TM pearly gray with normal light reflex, Right TM intact without perforation and Left TM intact without perforation.  Nose/Throat: External nose within normal limits, nasal crease present and septum midline, turbinates edematous and pale with  clear discharge, post-pharynx erythematous with cobblestoning in the posterior oropharynx. Tonsils 2+ without exudates Neck: Supple without thyromegaly.  Adenopathy: [place] bilateral anterior cervical lymphadenopathy. and No enlarged lymph nodes appreciated in the occipital, axillary, epitrochlear, inguinal, or popliteal regions. Lungs: Clear to auscultation without wheezing, rhonchi or rales. No increased work of breathing. CV: Normal S1/S2, no murmurs. Capillary refill <2 seconds.  Abdomen: Nondistended, nontender. No guarding or rebound tenderness. Bowel sounds difficult to evaluate due to body habitus  Skin: Warm and dry, without lesions or rashes. There are no active lesions noted.  Extremities:  No clubbing, cyanosis or edema. Neuro:   Grossly intact. No focal deficits appreciated. Responsive to questions.  Diagnostic studies:  Allergy Studies:   Indoor/Outdoor Percutaneous Adult Environmental Panel: negative to all with adequate controls.  Indoor/Outdoor Selected Intradermal Environmental Panel: positive to [place] grass, [place] grass, Grass mix, ragweed mix, weed mix, tree mix, mold mix #4 and cat. Otherwise negative with adequate controls.  Full Food Panel: negative to Peanut, Soy, Wheat, Corn, Milk, Egg, Casein, Shellfish Mix, Fish Mix, Cashew, [place], [place], [place], [place], [place], [place], [place], [place], [place], [place], [place], [place], [place], [place], [place], [place], [place] and [place] nut        Salvatore Marvel, MD [facility] [place] [place] [facility]

## 2016-12-03 NOTE — Patient Instructions (Addendum)
1. Allergic reactions with chronic hives - Testing to all of the foods we tested was negative (Peanut, Soy, Wheat, Corn, Milk, Egg, Casein, Shellfish Mix, Fish Mix, Cashew, [place], [place], [place], [place], [place], [place], [place], [place], [place], [place], [place], [place], [place], [place], [place], [place], [place] and [place] nut) - There is a the low positive predictive value of food allergy testing and hence the high possibility of false positives. - In contrast, food allergy testing has a high negative predictive value, therefore if testing is negative we can be relatively assured that they are indeed negative.  - We will get testing to look for a coffee allergy level and a seafood panel allergy level. - Your history does not have any "red flags" such as fevers, joint pains, or permanent skin changes that would be concerning for a more serious cause of hives.  - We will get some labs to rule out serious causes of hives: tryptase level, chronic urticaria panel, ANA, ESR, and CRP. - Chronic hives are often times a self limited process and will "burn themselves out" over 6-12 months, although this is not always the case.  - In the meantime, start suppressive dosing of antihistamines:   - Morning: Zyrtec (cetirizine) 10-31m (tne to two tablets)  - Evening: Zyrtec (cetirizine) 10-275m(one to two tablets) - You can change this dosing at home, decreasing the dose as needed or increasing the dosing as needed.  - If you are not tolerating the medications or are tired of taking them every day, we can start treatment with a monthly injectable medication called Xolair.   2. Perennial allergic rhinitis - Testing today showed: trees, weeds, grasses, molds, dust mites and cat - Avoidance measures provided. - Continue with Flonase (fluticasone) two sprays per nostril daily - Start Zyrtec (cetirizine) 1019mn the morning and 88m77m night - Consider nasal saline rinses 1-2 times daily to remove allergens from the nasal  cavities as well as help with mucous clearance (this is especially helpful to do before the nasal sprays are given) - Consider allergy shots as a means of long-term control. - Allergy shots "re-train" the immune system to ignore environmental allergens and decrease the resulting immune response to those allergens (sneezing, itchy watery eyes, runny nose, nasal congestion, etc).   - We can discuss more at the next appointment if the medications are not working for you.  3. Return in about 3 months (around 03/05/2017).   Please inform us o[place]any Emergency Department visits, hospitalizations, or changes in symptoms. Call us bKoreaore going to the ED for breathing or allergy symptoms since we might be able to fit you in for a sick visit. Feel free to contact us a[place]oreatime with any questions, problems, or concerns.  It was a pleasure to see you again today! Happy summer!   Websites that have reliable patient information: 1. American Academy of Asthma, Allergy, and Immunology: www.aaaai.org 2. Food Allergy Research and Education (FARE): foodallergy.org 3. Mothers of Asthmatics: http://www.asthmacommunitynetwork.org 4. American College of Allergy, Asthma, and Immunology: www.acaai.org  Reducing Pollen Exposure  The American Academy of Allergy, Asthma and Immunology suggests the following steps to reduce your exposure to pollen during allergy seasons.    1. Do not hang sheets or clothing out to dry; pollen may collect on these items. 2. Do not mow lawns or spend time around freshly cut grass; mowing stirs up pollen. 3. Keep windows closed at night.  Keep car windows closed while driving. 4. Minimize morning activities outdoors, a  time when pollen counts are usually at their highest. 5. Stay indoors as much as possible when pollen counts or humidity is high and on windy days when pollen tends to remain in the air longer. 6. Use air conditioning when possible.  Many air conditioners have filters that trap  the pollen spores. 7. Use a HEPA room air filter to remove pollen form the indoor air you breathe.  Control of Mold Allergen  Mold and fungi can grow on a variety of surfaces provided certain temperature and moisture conditions exist.  Outdoor molds grow on plants, decaying vegetation and soil.  The major outdoor mold, Alternaria and Cladosporium, are found in very high numbers during hot and dry conditions.  Generally, a late Summer - Fall peak is seen for common outdoor fungal spores.  Rain will temporarily lower outdoor mold spore count, but counts rise rapidly when the rainy period ends.  The most important indoor molds are Aspergillus and Penicillium.  Dark, humid and poorly ventilated basements are ideal sites for mold growth.  The next most common sites of mold growth are the bathroom and the kitchen.  Outdoor [employer] 1. Use air conditioning and keep windows closed 2. Avoid exposure to decaying vegetation. 3. Avoid leaf raking. 4. Avoid grain handling. 5. Consider wearing a face mask if working in moldy areas.  Indoor Mold Control 1. Maintain humidity below 50%. 2. Clean washable surfaces with 5% bleach solution. 3. Remove sources e.g. contaminated carpets.  Control of House Dust Mite Allergen    House dust mites play a major role in allergic asthma and rhinitis.  They occur in environments with high humidity wherever human skin, the food for dust mites is found. High levels have been detected in dust obtained from mattresses, pillows, carpets, upholstered furniture, bed covers, clothes and soft toys.  The principal allergen of the house dust mite is found in its feces.  A gram of dust may contain 1,000 mites and 250,000 fecal particles.  Mite antigen is easily measured in the air during house cleaning activities.    1. Encase mattresses, including the box spring, and pillow, in an air tight cover.  Seal the zipper end of the encased mattresses with wide adhesive tape. 2. Wash  the bedding in water of 130 degrees Farenheit weekly.  Avoid cotton comforters/quilts and flannel bedding: the most ideal bed covering is the dacron comforter. 3. Remove all upholstered furniture from the bedroom. 4. Remove carpets, carpet padding, rugs, and non-washable window drapes from the bedroom.  Wash drapes weekly or use plastic window coverings. 5. Remove all non-washable stuffed toys from the bedroom.  Wash stuffed toys weekly. 6. Have the room cleaned frequently with a vacuum cleaner and a damp dust-mop.  The patient should not be in a room which is being cleaned and should wait 1 hour after cleaning before going into the room. 7. Close and seal all heating outlets in the bedroom.  Otherwise, the room will become filled with dust-laden air.  An electric heater can be used to heat the room. 8. Reduce indoor humidity to less than 50%.  Do not use a humidifier.

## 2016-12-04 DIAGNOSIS — K219 Gastro-esophageal reflux disease without esophagitis: Secondary | ICD-10-CM | POA: Diagnosis not present

## 2016-12-07 ENCOUNTER — Encounter: Payer: Self-pay | Admitting: Family Medicine

## 2016-12-07 ENCOUNTER — Other Ambulatory Visit: Payer: Self-pay | Admitting: Surgery

## 2016-12-07 DIAGNOSIS — K219 Gastro-esophageal reflux disease without esophagitis: Secondary | ICD-10-CM

## 2016-12-08 LAB — ALLERGEN, COFFEE, RF221: Coffee: <0.1 kU/L

## 2016-12-08 LAB — ALLERGY PANEL 19, SEAFOOD GROUP
Allergen Salmon IgE: <0.1 kU/L
Catfish: <0.1 kU/L
Codfish IgE: <0.1 kU/L
F023-IgE Crab: <0.1 kU/L
F080-IgE Lobster: <0.1 kU/L
Shrimp IgE: <0.1 kU/L
Tuna: <0.1 kU/L

## 2016-12-08 LAB — TRYPTASE: Tryptase: 5.1 ug/L (ref 2.2–13.2)

## 2016-12-08 LAB — ANA: Anti Nuclear Antibody(ANA): NEGATIVE

## 2016-12-08 LAB — ALLERGEN STINGING INSECT PANEL
Honeybee IgE: 0.18 kU/L — AB
Hornet, White Face, IgE: <0.1 kU/L
Hornet, Yellow, IgE: <0.1 kU/L
Paper Wasp IgE: <0.1 kU/L
Yellow Jacket, IgE: <0.1 kU/L

## 2016-12-08 LAB — CHRONIC URTICARIA: cu index: <1 (ref <10)

## 2016-12-08 LAB — C-REACTIVE PROTEIN: CRP: 17.6 mg/L — ABNORMAL HIGH (ref 0.0–4.9)

## 2016-12-08 LAB — SEDIMENTATION RATE: Sed Rate: 32 mm/hr (ref 0–32)

## 2016-12-09 ENCOUNTER — Encounter: Payer: Self-pay | Admitting: Family Medicine

## 2016-12-10 ENCOUNTER — Other Ambulatory Visit: Payer: Self-pay

## 2016-12-11 ENCOUNTER — Telehealth: Payer: Self-pay | Admitting: Allergy & Immunology

## 2016-12-11 NOTE — Telephone Encounter (Signed)
We received the labs from [facility]. The shellfish panel was negative to all fish and shellfish tested (cod, crab, shrimp, tuna, salmon, lobster, and catfish). The stinging insect panel was essentially negative (honeybee was barely positive, but I am unsure of the clinical relevance of this). Chronic urticaria panel (which looks for evidence of anti-mast cell antibodies that trigger the mast cells indiscriminately) was negative. Coffee IgE was negative. The workup for rheumatologic causes of hives was negative aside from the CRP (inflammatory marker) which was elevated. In the context of the rest of the negative labs, I do not think that this is relevant. But we will recheck this next time that she comes in.   I will have one of our staff members call with the results.   Salvatore Marvel, MD [facility] [place]

## 2016-12-11 NOTE — Telephone Encounter (Signed)
Noted. Thanks for keeping me in the loop!   Ziyan Hillmer, MD [facility]  

## 2016-12-11 NOTE — Telephone Encounter (Signed)
I called patient and discussed what Dr.Gallagher wanted to inform the patient. I asked if she was interesting in a food challenge for tuna. Patient stated she was not interested, and if she changes her mind she will give [name] a call. Patient does have epi-pen.

## 2016-12-11 NOTE — Telephone Encounter (Signed)
Advised pt of lab results.  She states that she ate an egg roll with shrimp in it this past week and started to itch all over. She also had some tuna with the same reaction, so she is confused about how she has no shellfish allergies. She would like to know if she can try to eat tuna again at home or if we would like to schedule an oral challenge.

## 2016-12-11 NOTE — Telephone Encounter (Addendum)
Reviewed note. Sometimes very rarely patients have reactions even with the negative skin test and negative blood test. If she has symptoms with shrimp, she should continue to avoid it. Regarding the tuna, let's bring her in for a challenge. We can also send in an EpiPen for her in case the shrimp reactions worsen.   Also, something to consider are itching from her opioids. She has percocet listed, and opioids are well known to cause mast cell degranulation and itching. This could also be a manifestation of chronic hives and have absolutely nothing to do with what she is eating.   Salvatore Marvel, MD [facility] [place]

## 2016-12-16 ENCOUNTER — Ambulatory Visit
Admission: RE | Admit: 2016-12-16 | Discharge: 2016-12-16 | Disposition: A | Discharge status: Home / Self Care | Payer: Medicare Other | Source: Ambulatory Visit | Attending: Surgery | Admitting: Surgery

## 2016-12-16 DIAGNOSIS — K219 Gastro-esophageal reflux disease without esophagitis: Secondary | ICD-10-CM

## 2016-12-16 DIAGNOSIS — K209 Esophagitis, unspecified: Secondary | ICD-10-CM | POA: Diagnosis not present

## 2016-12-24 ENCOUNTER — Ambulatory Visit: Payer: Self-pay | Admitting: Gastroenterology

## 2016-12-30 DIAGNOSIS — K219 Gastro-esophageal reflux disease without esophagitis: Secondary | ICD-10-CM | POA: Diagnosis not present

## 2017-02-15 ENCOUNTER — Telehealth: Payer: Self-pay | Admitting: *Deleted

## 2017-02-15 NOTE — Telephone Encounter (Signed)
Dear [name] Team Please double book her on the 17th at 11:30 AM [facility]! Crystal Garza

## 2017-02-15 NOTE — Telephone Encounter (Signed)
Patient states she has been having bad neck pain but Dr. Verlon Au first available isnt until 11/7. Wants to know if MD would be willing to work her in sooner than this.

## 2017-02-16 NOTE — Telephone Encounter (Signed)
Spoke with Dr. Nori Riis and she said that she meant for it to be on the 24th of OCt.  Contacted pt and informed her that we could see her on 02/24/17 @ 11:30am. Katharina Caper, Marlaysia Lenig D, CMA

## 2017-02-24 ENCOUNTER — Ambulatory Visit (INDEPENDENT_AMBULATORY_CARE_PROVIDER_SITE_OTHER): Payer: Medicare Other | Admitting: Family Medicine

## 2017-02-24 ENCOUNTER — Encounter: Payer: Self-pay | Admitting: Family Medicine

## 2017-02-24 VITALS — BP 118/76 | HR 73 | Temp 97.9°F | Ht 67.0 in | Wt 342.4 lb

## 2017-02-24 DIAGNOSIS — M5410 Radiculopathy, site unspecified: Secondary | ICD-10-CM

## 2017-02-24 DIAGNOSIS — Z23 Encounter for immunization: Secondary | ICD-10-CM

## 2017-02-24 DIAGNOSIS — M542 Cervicalgia: Secondary | ICD-10-CM | POA: Diagnosis not present

## 2017-02-24 DIAGNOSIS — M549 Dorsalgia, unspecified: Secondary | ICD-10-CM | POA: Diagnosis not present

## 2017-02-24 DIAGNOSIS — G8929 Other chronic pain: Secondary | ICD-10-CM | POA: Diagnosis not present

## 2017-02-24 MED ORDER — OXYCODONE-ACETAMINOPHEN 10-325 MG PO TABS: ORAL_TABLET | ORAL | 0 refills | Status: DC

## 2017-02-24 NOTE — Progress Notes (Signed)
    CHIEF COMPLAINT / HPI:   Posterior neck pain. She's had this before but never to this level. In the last 2-3 weeks she's having constant pain, 6 out of 10. Lying down with a rolled up towel underneath her neck helps some but not a lot. She can have some relief from the tizanidine but cannot take it while she's working. She is able to work using her chronic pain medicine that helps some but not giving her adequate relief. Pain is in the posterior neck and up into the occiput. Occasionally radiates into the trapezius and upper back area. Is an aching aching tense tight feeling.  REVIEW OF SYSTEMS:  No unusual weight change. She's had no fever. Occasionally she has associated headache with the neck pain but not always. She's had no blurred vision or visual changes. No nausea. No unusual neurological symptoms.  PERTINENT  PMH / PSH: I have reviewed the patient's medications, allergies, past medical and surgical history, smoking status and updated in the EMR as appropriate.   OBJECTIVE: GEN.: Well-developed overweight female no acute distress HEENT: Tender to palpation over the occiput in the posterior neck muscles down into bilateral trapezius. Palpation here reproduces her pain. She has some muscle spasm bilaterally. She has full range of motion flexion extension lateral rotation of the neck. Negative Spurling's. Extraocular muscles are intact. Pupils are equal round reactive to light. Oropharynx is without any sign of exudate.  ASSESSMENT / PLAN: Please see problem oriented charting for details

## 2017-02-24 NOTE — Assessment & Plan Note (Signed)
Subacute worsening of her chronic neck and upper back pain. Previously we did try to pursue breast reduction surgery but secondary insurance constraints she was unable to get that done. Not sure what's causing the exacerbation at this time. I think we should start with increasing her chronic pain medicine dosage just a little bit because she is able to work with that and she is not able to work using the muscle relaxer. I would also like to get cervical spine series including flexion and extension. We may need to pursue MRI if we decide to consider cervical steroid injections under fluoroscopy. Gave her new prescription for increased dose of her chronic pain medicine. She will follow-up in the next month. Have ordered the x-rays.

## 2017-02-25 ENCOUNTER — Ambulatory Visit
Admission: RE | Admit: 2017-02-25 | Discharge: 2017-02-25 | Disposition: A | Discharge status: Home / Self Care | Payer: Medicare Other | Source: Ambulatory Visit | Attending: Family Medicine | Admitting: Family Medicine

## 2017-02-25 DIAGNOSIS — M542 Cervicalgia: Secondary | ICD-10-CM

## 2017-02-25 DIAGNOSIS — M47812 Spondylosis without myelopathy or radiculopathy, cervical region: Secondary | ICD-10-CM | POA: Diagnosis not present

## 2017-02-25 DIAGNOSIS — M5410 Radiculopathy, site unspecified: Secondary | ICD-10-CM

## 2017-03-02 ENCOUNTER — Encounter: Payer: Self-pay | Admitting: Family Medicine

## 2017-03-04 ENCOUNTER — Ambulatory Visit (INDEPENDENT_AMBULATORY_CARE_PROVIDER_SITE_OTHER): Payer: Medicare Other | Admitting: Orthopaedic Surgery

## 2017-03-04 ENCOUNTER — Ambulatory Visit (INDEPENDENT_AMBULATORY_CARE_PROVIDER_SITE_OTHER): Payer: Medicare Other

## 2017-03-04 ENCOUNTER — Encounter (INDEPENDENT_AMBULATORY_CARE_PROVIDER_SITE_OTHER): Payer: Self-pay | Admitting: Orthopaedic Surgery

## 2017-03-04 DIAGNOSIS — M25552 Pain in left hip: Secondary | ICD-10-CM

## 2017-03-04 NOTE — Progress Notes (Signed)
Office Visit Note   Patient: Crystal Garza           Date of Birth: Jan 25, 1971           MRN: [id number] Visit Date: 03/04/2017              Requested by: Dickie La, MD [address], [place] [address] PCP: Dickie La, MD   Assessment & Plan: Visit Diagnoses:  1. Pain in left hip     Plan: Impression is left hip and leg contusion.  Recommend continued symptomatic treatment.  She has chronic pain syndrome for which she takes Percocet.  Questions encouraged and answered.  Follow-up as needed.  Follow-Up Instructions: Return if symptoms worsen or fail to improve.   Orders:  Orders Placed This Encounter  Procedures  . XR HIP UNILAT W OR W/O PELVIS 2-3 VIEWS LEFT   No orders of the defined types were placed in this encounter.     Procedures: No procedures performed   Clinical Data: No additional findings.   Subjective: Chief Complaint  Patient presents with  . Left Hip - Pain    Crystal Garza is a 46 year old female who had a fall onto her left leg recently and has now had pain and discomfort in her left hip and thigh area.  She does have chronic back pain with radicular symptoms down into her left foot.  She takes Percocets for these.  She is on anticoagulation permanently for DVT.  She is taking tizanidine also.  She has not been to work because of the pain.    Review of Systems  Constitutional: Negative.   HENT: Negative.   Eyes: Negative.   Respiratory: Negative.   Cardiovascular: Negative.   Endocrine: Negative.   Musculoskeletal: Negative.   Neurological: Negative.   Hematological: Negative.   Psychiatric/Behavioral: Negative.   All other systems reviewed and are negative.    Objective: Vital Signs: There were no vitals taken for this visit.  Physical Exam  Constitutional: She is oriented to person, place, and time. She appears well-developed and well-nourished.  HENT:  Head: Normocephalic and atraumatic.  Eyes: EOM are normal.    Neck: Neck supple.  Pulmonary/Chest: Effort normal.  Abdominal: Soft.  Neurological: She is alert and oriented to person, place, and time.  Skin: Skin is warm. Capillary refill takes less than 2 seconds.  Psychiatric: She has a normal mood and affect. Her behavior is normal. Judgment and thought content normal.  Nursing note and vitals reviewed.   Ortho Exam Left hip and knee exam shows no significant pain with rotation of the joints.  There is no bony crepitus.  No significant swelling.  She has generalized vague discomfort with palpation of the soft tissues. Specialty Comments:  No specialty comments available.  Imaging: Xr Hip Unilat W Or W/o Pelvis 2-3 Views Left  Result Date: 03/04/2017 No acute bony abnormalities.  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    PMFS History: Patient Active Problem List   Diagnosis Date Noted  . Symptoms of gastroesophageal reflux 11/09/2016  . Esophageal dysphagia   . Nausea without vomiting   . Family history of colon cancer   . Dizziness 11/08/2015  . 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 Chronic neck and back pain 02/27/2015  . Other chest pain 12/10/2014  . Thrombocytosis ([place]) 08/29/2014  . Medication management 08/22/2014  . Hematuria 08/22/2014  . Pain in joint, ankle and foot 05/23/2014  . Chronic anticoagulation 05/17/2014  .  Chronic thoracic back pain 04/11/2014  . Right flank pain 01/17/2014  . Degenerative arthritis of left knee 10/06/2013  . Status post total knee replacement 10/06/2013  . Edema 07/28/2013  . Chronic pain 06/13/2013  . 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 Bipolar disorder ([place]) 01/19/2012  . Nephrolithiasis 02/20/2011  . Eczematous dermatitis 10/22/2010  . Iron deficiency anemia 06/27/2010  . Anxiety 06/27/2010  . Migraine 09/12/2009  . Morbid obesity-BMI 62 10/30/2008  . Osteoarthrosis, unspecified whether generalized or  localized, involving lower leg 07/05/2007  . DEPRESSION, MAJOR, RECURRENT 07/01/2006  . HYPERTENSION, BENIGN SYSTEMIC 07/01/2006  . RHINITIS, ALLERGIC 07/01/2006  . Gastroesophageal reflux disease 07/01/2006   Past Medical History:  Diagnosis Date  . Anemia    iron def  . Anginal pain ([place])   . Anxiety   . Arthritis    left knee   . Back pain   . Benign gastrointestinal stromal tumor (GIST)   . Chronic knee pain   . Depression   . DVT (deep venous thrombosis) ([place])   . GERD (gastroesophageal reflux disease)   . Hx of laparoscopic gastric banding   . Hypertension   . Kidney stones   . Migraine   . Morbid obesity ([place])   . PE (pulmonary embolism)     Family History  Problem Relation Age of Onset  . Diabetes Mother   . Diabetes Father   . Heart disease Father   . Colon cancer Father        brain tumor  . Colon polyps Father   . Colitis Father   . Irritable bowel syndrome Father   . Diabetes Maternal Grandmother   . Diabetes Maternal Grandfather   . Schizophrenia Maternal Grandfather   . Heart disease Paternal Uncle   . Diabetes Paternal Uncle   . Heart disease Paternal Grandmother   . Heart disease Paternal Grandfather   . Diabetes Maternal Aunt   . Diabetes Maternal Uncle   . Diabetes Paternal Aunt     Past Surgical History:  Procedure Laterality Date  . Elias-Fela Solis STUDY N/A 05/13/2016   Procedure: [place] STUDY;  Surgeon: Manus Gunning, MD;  Location: [facility];  Service: Gastroenterology;  Laterality: N/A;  . ABDOMINAL HYSTERECTOMY    . BIOPSY  02/16/2012   Procedure: BIOPSY;  Surgeon: [name], MD;  Location: [facility];  Service: General;;  biopsy of mass x 2  . BREATH TEK H PYLORI  07/28/2011   Procedure: [place];  Surgeon: [name], MD;  Location: [facility];  Service: General;  Laterality: N/A;  to be done at 745  . CESAREAN SECTION  05/1991  . COLONOSCOPY WITH PROPOFOL N/A 03/03/2016   Procedure: COLONOSCOPY WITH  PROPOFOL;  Surgeon: Manus Gunning, MD;  Location: [facility];  Service: Gastroenterology;  Laterality: N/A;  . ESOPHAGEAL MANOMETRY N/A 05/13/2016   Procedure: ESOPHAGEAL MANOMETRY (EM);  Surgeon: Manus Gunning, MD;  Location: [facility];  Service: Gastroenterology;  Laterality: N/A;  . ESOPHAGOGASTRODUODENOSCOPY N/A 08/29/2012   Procedure: ESOPHAGOGASTRODUODENOSCOPY (EGD);  Surgeon: [name], MD;  Location: [facility];  Service: General;  Laterality: N/A;  . ESOPHAGOGASTRODUODENOSCOPY N/A 09/04/2014   Procedure: ESOPHAGOGASTRODUODENOSCOPY (EGD);  Surgeon: Alphonsa Overall, MD;  Location: [facility];  Service: General;  Laterality: N/A;  . ESOPHAGOGASTRODUODENOSCOPY (EGD) WITH PROPOFOL N/A 03/03/2016   Procedure: ESOPHAGOGASTRODUODENOSCOPY (EGD) WITH PROPOFOL;  Surgeon: Manus Gunning,  MD;  Location: [facility];  Service: Gastroenterology;  Laterality: N/A;  . EUS  03/03/2012   Procedure: UPPER ENDOSCOPIC ULTRASOUND (EUS) LINEAR;  Surgeon: Milus Banister, MD;  Location: [facility];  Service: Endoscopy;  Laterality: N/A;  . gist    . KNEE ARTHROSCOPY    . LAPAROSCOPIC GASTRECTOMY  04/08/2012   Procedure: LAPAROSCOPIC GASTRECTOMY;  Surgeon: [name], MD;  Location: [facility];  Service: General;;  removal of GIST tumor of stomach  . LAPAROSCOPIC GASTRIC SLEEVE RESECTION N/A 08/29/2012   Procedure: LAPAROSCOPIC GASTRIC SLEEVE RESECTION;  Surgeon: [name], MD;  Location: [facility];  Service: General;  Laterality: N/A;  Sleeve Gastrectomy  . TOTAL KNEE ARTHROPLASTY Left 10/06/2013   Procedure: LEFT TOTAL KNEE ARTHROPLASTY;  Surgeon: Mcarthur Rossetti, MD;  Location: [facility];  Service: Orthopedics;  Laterality: Left;  . TUBAL LIGATION     Social History   Occupational History  . Not on file.   Social History Main Topics  . Smoking status: Never Smoker  . Smokeless tobacco: Never Used  . Alcohol use 0.0 oz/week     Comment: maybe once a month  . Drug  use: No  . Sexual activity: Not on file

## 2017-03-04 NOTE — Addendum Note (Signed)
Addended by: Azucena Cecil on: 03/04/2017 09:19 AM   Modules accepted: Level of Service

## 2017-03-10 ENCOUNTER — Encounter: Payer: Self-pay | Admitting: Family Medicine

## 2017-03-11 ENCOUNTER — Ambulatory Visit: Payer: Medicare Other | Admitting: Allergy & Immunology

## 2017-03-18 ENCOUNTER — Ambulatory Visit: Payer: Medicare Other | Admitting: Allergy & Immunology

## 2017-03-18 DIAGNOSIS — R1032 Left lower quadrant pain: Secondary | ICD-10-CM | POA: Diagnosis not present

## 2017-03-22 ENCOUNTER — Emergency Department (HOSPITAL_COMMUNITY): Payer: Medicare Other

## 2017-03-22 ENCOUNTER — Encounter (HOSPITAL_COMMUNITY): Payer: Self-pay | Admitting: Family Medicine

## 2017-03-22 ENCOUNTER — Emergency Department (HOSPITAL_COMMUNITY)
Admission: EM | Admit: 2017-03-22 | Discharge: 2017-03-22 | Disposition: A | Discharge status: Home / Self Care | Payer: Medicare Other | Attending: Emergency Medicine | Admitting: Emergency Medicine

## 2017-03-22 DIAGNOSIS — S0990XA Unspecified injury of head, initial encounter: Secondary | ICD-10-CM

## 2017-03-22 DIAGNOSIS — Y929 Unspecified place or not applicable: Secondary | ICD-10-CM | POA: Insufficient documentation

## 2017-03-22 DIAGNOSIS — M25532 Pain in left wrist: Secondary | ICD-10-CM | POA: Insufficient documentation

## 2017-03-22 DIAGNOSIS — Z7901 Long term (current) use of anticoagulants: Secondary | ICD-10-CM | POA: Insufficient documentation

## 2017-03-22 DIAGNOSIS — I10 Essential (primary) hypertension: Secondary | ICD-10-CM | POA: Diagnosis not present

## 2017-03-22 DIAGNOSIS — Y999 Unspecified external cause status: Secondary | ICD-10-CM | POA: Insufficient documentation

## 2017-03-22 DIAGNOSIS — S098XXA Other specified injuries of head, initial encounter: Secondary | ICD-10-CM | POA: Diagnosis not present

## 2017-03-22 DIAGNOSIS — W01198A Fall on same level from slipping, tripping and stumbling with subsequent striking against other object, initial encounter: Secondary | ICD-10-CM | POA: Diagnosis not present

## 2017-03-22 DIAGNOSIS — Y939 Activity, unspecified: Secondary | ICD-10-CM | POA: Insufficient documentation

## 2017-03-22 DIAGNOSIS — W19XXXA Unspecified fall, initial encounter: Secondary | ICD-10-CM

## 2017-03-22 DIAGNOSIS — Z79899 Other long term (current) drug therapy: Secondary | ICD-10-CM | POA: Diagnosis not present

## 2017-03-22 DIAGNOSIS — S6992XA Unspecified injury of left wrist, hand and finger(s), initial encounter: Secondary | ICD-10-CM | POA: Diagnosis not present

## 2017-03-22 MED ORDER — ACETAMINOPHEN 500 MG PO TABS: 1000.0000 mg | ORAL_TABLET | Freq: Once | ORAL | Status: DC

## 2017-03-22 NOTE — ED Notes (Signed)
Pt moved from Fast track to room 2 because she was upset about being in fast track instead of a room with a bed. Pt complained of severe head pain and stated she wanted to leave if she couldn't be in a more comfortable setting. Pt stated "I am being billed for being in a regular room and this is not a regular room." RN talked to patient and explained the risks of not having a CT scan after having a fall and hitting her head with her medication hx. Pt agreed to stay if she could be put in a room.  PA made aware

## 2017-03-22 NOTE — Discharge Instructions (Signed)
Please read the attached information regarding your condition. Take Tylenol as needed for pain. Continue your home medications as previously prescribed. Follow-up with your primary care provider for further evaluation. Return to ED for worsening headache, worsening vision changes, additional injury or falls.

## 2017-03-22 NOTE — ED Provider Notes (Signed)
[facility] DEPT Provider Note   CSN: [id number] Arrival date & time: 03/22/17  1553     History   Chief Complaint Chief Complaint  Patient presents with  . Fall    HPI Crystal Garza is a 46 y.o. female presents to ED for evaluation of headache, left wrist pain after mechanical fall that occurred prior to arrival.  She states that she tripped on something on the sidewalk and then when she got up to try to walk she hit her head on the sidewalk.  She has been having a headache, intermittent blurry vision and left wrist pain since then.  She denies any loss of consciousness.  She denies any neck pain, numbness in legs, back pain, urinary incontinence or bowel incontinence.  No previous back surgery noted.  No history of cancer or IV drug use.  HPI  Past Medical History:  Diagnosis Date  . Anemia    iron def  . Anginal pain ([place])   . Anxiety   . Arthritis    left knee   . Back pain   . Benign gastrointestinal stromal tumor (GIST)   . Chronic knee pain   . Depression   . DVT (deep venous thrombosis) ([place])   . GERD (gastroesophageal reflux disease)   . Hx of laparoscopic gastric banding   . Hypertension   . Kidney stones   . Migraine   . Morbid obesity ([place])   . PE (pulmonary embolism)     Patient Active Problem List   Diagnosis Date Noted  . Symptoms of gastroesophageal reflux 11/09/2016  . Esophageal dysphagia   . Nausea without vomiting   . Family history of colon cancer   . Dizziness 11/08/2015  . 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 Chronic neck and back pain 02/27/2015  . Other chest pain 12/10/2014  . Thrombocytosis ([place]) 08/29/2014  . Medication management 08/22/2014  . Hematuria 08/22/2014  . Pain in joint, ankle and foot 05/23/2014  . Chronic anticoagulation 05/17/2014  . Chronic thoracic back pain 04/11/2014  . Right flank pain 01/17/2014  . Degenerative arthritis of left knee 10/06/2013  . Status post total knee replacement  10/06/2013  . Edema 07/28/2013  . Chronic pain 06/13/2013  . 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 Bipolar disorder ([place]) 01/19/2012  . Nephrolithiasis 02/20/2011  . Eczematous dermatitis 10/22/2010  . Iron deficiency anemia 06/27/2010  . Anxiety 06/27/2010  . Migraine 09/12/2009  . Morbid obesity-BMI 62 10/30/2008  . Osteoarthrosis, unspecified whether generalized or localized, involving lower leg 07/05/2007  . DEPRESSION, MAJOR, RECURRENT 07/01/2006  . HYPERTENSION, BENIGN SYSTEMIC 07/01/2006  . RHINITIS, ALLERGIC 07/01/2006  . Gastroesophageal reflux disease 07/01/2006    Past Surgical History:  Procedure Laterality Date  . [place] STUDY N/A 05/13/2016   Performed by Manus Gunning, MD at [facility]  . ABDOMINAL HYSTERECTOMY    . BIOPSY  02/16/2012   Performed by [name], MD at [facility] ORS  . BREATH TEK H PYLORI N/A 07/28/2011   Performed by [name], MD at [facility]  . CESAREAN SECTION  05/1991  . COLONOSCOPY WITH PROPOFOL N/A 03/03/2016   Performed by Manus Gunning, MD at [facility]  . ESOPHAGEAL MANOMETRY (EM) N/A 05/13/2016   Performed by Manus Gunning, MD at [place]  . ESOPHAGOGASTRODUODENOSCOPY (EGD) N/A 09/04/2014   Performed by Alphonsa Overall, MD at [facility]  .  ESOPHAGOGASTRODUODENOSCOPY (EGD) N/A 08/29/2012   Performed by [name], MD at [facility] ORS  . ESOPHAGOGASTRODUODENOSCOPY (EGD) WITH PROPOFOL N/A 03/03/2016   Performed by Manus Gunning, MD at [facility]  . gist    . KNEE ARTHROSCOPY    . LAPAROSCOPIC ABDOMINAL EXPLORATION  02/16/2012   Performed by [name], MD at [facility] ORS  . LAPAROSCOPIC GASTRECTOMY  04/08/2012   Performed by [name], MD at [facility] ORS  . LAPAROSCOPIC GASTRIC SLEEVE RESECTION N/A 08/29/2012   Performed by [name], MD at [facility] ORS  . LEFT TOTAL KNEE ARTHROPLASTY Left  10/06/2013   Performed by Mcarthur Rossetti, MD at [facility] ORS  . TUBAL LIGATION    . UPPER ENDOSCOPIC ULTRASOUND (EUS) LINEAR N/A 03/03/2012   Performed by Milus Banister, MD at [place]  . UPPER GI ENDOSCOPY  02/16/2012   Performed by [name], MD at [facility] ORS    OB History    No data available       Home Medications    Prior to Admission medications   Medication Sig Start Date End Date Taking? Authorizing Provider  desipramine (NORPRAMIN) 10 MG tablet Take 1 tablet (10 mg total) by mouth at bedtime. 11/24/16  Yes Armbruster, Carlota Raspberry, MD  dexlansoprazole (DEXILANT) 60 MG capsule Take 1 capsule (60 mg total) by mouth daily. 08/05/16 07/31/17 Yes Dickie La, MD  hydrOXYzine (VISTARIL) 25 MG capsule Take 1 capsule (25 mg total) by mouth at bedtime. 08/07/15  Yes Arfeen, Arlyce Harman, MD  lamoTRIgine (LAMICTAL) 200 MG tablet Take 1 tablet (200 mg total) by mouth at bedtime. 08/05/16  Yes Dickie La, MD  lisinopril (PRINIVIL,ZESTRIL) 10 MG tablet Take 1 tablet (10 mg total) by mouth daily. 04/15/16  Yes Dickie La, MD  methenamine (HIPREX) 1 g tablet Take 1 g 2 (two) times daily with a meal by mouth.  11/30/16  Yes [provider]  nystatin cream (MYCOSTATIN) APPLY TOPICALLY 2 TIMES A DAY Patient taking differently: Apply 1 application topically 2 (two) times daily as needed for dry skin.  08/05/16  Yes Dickie La, MD  ondansetron (ZOFRAN-ODT) 4 MG disintegrating tablet Take 4 mg by mouth every 8 (eight) hours as needed for nausea.  01/23/16  Yes [provider]  oxyCODONE-acetaminophen (PERCOCET) 10-325 MG tablet Take one by mouth three tiems a day as directed for chronic neck and leg pain 30 day supply do not fill before March 27 2017 02/24/17  Yes Dickie La, MD  polyethylene glycol powder (GLYCOLAX/MIRALAX) powder Taper up to 1 or 2 scoops daily Patient taking differently: Take 1 Container daily as needed by mouth for mild constipation or moderate constipation.  Taper up to 1 or 2 scoops daily 12/02/16  Yes Dickie La, MD  risperiDONE (RISPERDAL) 1 MG tablet Take 1 tablet (1 mg total) by mouth at bedtime. 08/05/16  Yes Dickie La, MD  rivaroxaban (XARELTO) 20 MG TABS tablet Take 1 tablet (20 mg total) by mouth daily with supper. 08/05/16  Yes Dickie La, MD  tiZANidine (ZANAFLEX) 4 MG tablet Take 1 tablet (4 mg total) by mouth 2 (two) times daily as needed for muscle spasms. 08/05/16  Yes Dickie La, MD  VESICARE 5 MG tablet Take 5 mg daily by mouth.  11/30/16  Yes [provider]  ibuprofen (ADVIL,MOTRIN) 600 MG tablet Take 1 tablet (600 mg total) by mouth every 6 (six) hours as  needed. Patient not taking: Reported on 03/22/2017 09/13/16   Charlann Lange, PA-C  meclizine (ANTIVERT) 32 MG tablet Take 1 tablet (32 mg total) by mouth 3 (three) times daily as needed. Patient not taking: Reported on 03/22/2017 11/08/15   Nicolette Bang, DO  mupirocin ointment Drue Stager) 2 % Apply t o affected skin area twice daily as directed Patient not taking: Reported on 03/22/2017 02/19/16   Dickie La, MD  predniSONE (DELTASONE) 20 MG tablet Take 2 tablets (40 mg total) by mouth daily. Patient not taking: Reported on 03/22/2017 10/06/16   Davonna Belling, MD  sucralfate (CARAFATE) 1 GM/10ML suspension Take 10 mLs (1 g total) by mouth 4 (four) times daily -  with meals and at bedtime. Patient not taking: Reported on 03/22/2017 11/11/16   Levin Erp, PA    Family History Family History  Problem Relation Age of Onset  . Diabetes Mother   . Diabetes Father   . Heart disease Father   . Colon cancer Father        brain tumor  . Colon polyps Father   . Colitis Father   . Irritable bowel syndrome Father   . Diabetes Maternal Grandmother   . Diabetes Maternal Grandfather   . Schizophrenia Maternal Grandfather   . Heart disease Paternal Uncle   . Diabetes Paternal Uncle   . Heart disease Paternal Grandmother   . Heart disease Paternal  Grandfather   . Diabetes Maternal Aunt   . Diabetes Maternal Uncle   . Diabetes Paternal Aunt     Social History Social History   Tobacco Use  . Smoking status: Never Smoker  . Smokeless tobacco: Never Used  Substance Use Topics  . Alcohol use: No    Alcohol/week: 0.0 oz    Frequency: Never  . Drug use: No     Allergies   Codeine   Review of Systems Review of Systems  Constitutional: Negative for appetite change, chills and fever.  HENT: Negative for ear pain, rhinorrhea, sneezing and sore throat.   Eyes: Positive for visual disturbance. Negative for photophobia.  Respiratory: Negative for cough, chest tightness, shortness of breath and wheezing.   Cardiovascular: Negative for chest pain and palpitations.  Gastrointestinal: Negative for abdominal pain, blood in stool, constipation, diarrhea, nausea and vomiting.  Genitourinary: Negative for dysuria, hematuria and urgency.  Musculoskeletal: Positive for arthralgias, joint swelling and myalgias.  Skin: Negative for rash.  Neurological: Positive for light-headedness and headaches. Negative for dizziness and weakness.     Physical Exam Updated Vital Signs BP (!) 172/135   Pulse 88   Temp 98.2 F (36.8 C) (Oral)   Resp 16   Ht 5\' 7"  (1.702 m)   Wt (!) 154.2 kg (340 lb)   SpO2 100%   BMI 53.25 kg/m   Physical Exam  Constitutional: She is oriented to person, place, and time. She appears well-developed and well-nourished. No distress.  HENT:  Head: Normocephalic and atraumatic.  Nose: Nose normal.  Eyes: Conjunctivae and EOM are normal. Pupils are equal, round, and reactive to light. Right eye exhibits no discharge. Left eye exhibits no discharge. No scleral icterus.  Neck: Normal range of motion. Neck supple.  Cardiovascular: Normal rate, regular rhythm, normal heart sounds and intact distal pulses. Exam reveals no gallop and no friction rub.  No murmur heard. Pulmonary/Chest: Effort normal and breath sounds  normal. No respiratory distress.  Abdominal: Soft. Bowel sounds are normal. She exhibits no distension. There is no tenderness. There  is no guarding.  Musculoskeletal: Normal range of motion. She exhibits tenderness. She exhibits no edema.  Diffuse tenderness to palpation of the left wrist with no visible edema, deformity or temperature change noted.  Neurological: She is alert and oriented to person, place, and time. No cranial nerve deficit or sensory deficit. She exhibits normal muscle tone. Coordination normal.  Pupils reactive. No facial asymmetry noted. Cranial nerves appear grossly intact. Sensation intact to light touch on face, BUE and BLE. Strength 5/5 in BUE and BLE. Normal finger to nose coordination bilaterally.  Skin: Skin is warm and dry. No rash noted.  No ecchymosis or wound noted in head.  Psychiatric: She has a normal mood and affect.  Nursing note and vitals reviewed.    ED Treatments / Results  Labs (all labs ordered are listed, but only abnormal results are displayed) Labs Reviewed - No data to display  EKG  EKG Interpretation None       Radiology Dg Wrist Complete Left  Result Date: 03/22/2017 CLINICAL DATA:  Patient fell on outstretched hand. Left wrist pain along the radial aspect. EXAM: LEFT WRIST - COMPLETE 3+ VIEW COMPARISON:  None. FINDINGS: There is no evidence of fracture or dislocation. Dedicated view of the scaphoid demonstrates no fracture. Subchondral cysts or erosions of the ulnar styloid. Soft tissues are unremarkable. IMPRESSION: 1. Negative for acute fracture or malalignment of the left carpal bones. 2. Probable degenerative cysts or erosions of the ulnar styloid. Electronically Signed   By: Ashley Royalty M.D.   On: 03/22/2017 20:44   Ct Head Wo Contrast  Result Date: 03/22/2017 CLINICAL DATA:  Head trauma while on anti coagulation. EXAM: CT HEAD WITHOUT CONTRAST TECHNIQUE: Contiguous axial images were obtained from the base of the skull through  the vertex without intravenous contrast. COMPARISON:  Cervical spine MRI 04/01/2010 Head CT 07/22/2005 FINDINGS: Brain: No mass lesion, intraparenchymal hemorrhage or extra-axial collection. No evidence of acute cortical infarct. Brain parenchyma and CSF-containing spaces are normal for age. Partially empty, expanded sella, unchanged compared to remote prior studies such as 07/22/2005. Vascular: No hyperdense vessel or unexpected calcification. Skull: Normal visualized skull base, calvarium and extracranial soft tissues. Sinuses/Orbits: No sinus fluid levels or advanced mucosal thickening. No mastoid effusion. Normal orbits. IMPRESSION: 1. No acute intracranial abnormality. 2. Unchanged appearance of expanded, partially empty sella. Electronically Signed   By: Ulyses Jarred M.D.   On: 03/22/2017 21:34    Procedures Procedures (including critical care time)  Medications Ordered in ED Medications - No data to display   Initial Impression / Assessment and Plan / ED Course  I have reviewed the triage vital signs and the nursing notes.  Pertinent labs & imaging results that were available during my care of the patient were reviewed by me and considered in my medical decision making (see chart for details).     This patient presents to ED for evaluation of headache, blurry vision and left wrist pain after fall that occurred prior to arrival.  She states that she tripped on the sidewalk.  She denies any loss of consciousness.  She does take blood thinners.  On physical exam there is diffuse tenderness to palpation of the left wrist but no visible deformity, color or temperature change noted.  She has full active and passive range of motion of the wrist.  She has no deficits on neurological exam.  Cranial nerves appear grossly intact with no changes in sensory or coordination.  X-ray of wrist returned as negative.  CT of head return as negative.  Patient initially wanted to leave AMA due to discomfort of  being in a fast track room.  However I educated the patient on risk and benefits and stated that we would move her to another room.  Patient declines any medication including Tylenol at this time.  She states that she will take her home medications.  We will give her a splint to be worn as directed.  Patient appears stable for discharge at this time.  Strict return precautions given.  Final Clinical Impressions(s) / ED Diagnoses   Final diagnoses:  Injury of head, initial encounter  Fall, initial encounter    ED Discharge Orders    None       Delia Heady, PA-C 03/22/17 2150    Isla Pence, MD 03/22/17 2202

## 2017-03-22 NOTE — ED Triage Notes (Signed)
Patient reports she tripped after getting off the bus this morning. She attempted to catch herself but was unable too. She hit her head on the sidewalk and denies any LOC. Patient complains of headache and back pain. Patient reports she has took OXYCODONE earlier today for pain with no relief. Patient denies any numbness, tingling, and loss of bowel/bladder. Patient is able to ambulate with a steady gait.

## 2017-03-22 NOTE — ED Notes (Signed)
Bed: WA02 Expected date:  Expected time:  Means of arrival:  Comments: No bed  

## 2017-03-31 ENCOUNTER — Ambulatory Visit: Payer: Medicare Other | Admitting: Family Medicine

## 2017-03-31 ENCOUNTER — Encounter: Payer: Self-pay | Admitting: Family Medicine

## 2017-03-31 ENCOUNTER — Encounter: Payer: Self-pay | Admitting: Allergy & Immunology

## 2017-03-31 ENCOUNTER — Other Ambulatory Visit: Payer: Self-pay

## 2017-03-31 ENCOUNTER — Ambulatory Visit (INDEPENDENT_AMBULATORY_CARE_PROVIDER_SITE_OTHER): Payer: Medicare Other | Admitting: Allergy & Immunology

## 2017-03-31 ENCOUNTER — Ambulatory Visit: Payer: Medicare Other | Admitting: Allergy & Immunology

## 2017-03-31 ENCOUNTER — Ambulatory Visit (INDEPENDENT_AMBULATORY_CARE_PROVIDER_SITE_OTHER): Payer: Medicare Other | Admitting: Family Medicine

## 2017-03-31 VITALS — BP 118/82 | HR 78 | Temp 97.6°F | Ht 67.0 in | Wt 345.8 lb

## 2017-03-31 VITALS — BP 122/76 | HR 97 | Resp 19

## 2017-03-31 DIAGNOSIS — F3170 Bipolar disorder, currently in remission, most recent episode unspecified: Secondary | ICD-10-CM | POA: Diagnosis not present

## 2017-03-31 DIAGNOSIS — J3089 Other allergic rhinitis: Secondary | ICD-10-CM | POA: Diagnosis not present

## 2017-03-31 DIAGNOSIS — J302 Other seasonal allergic rhinitis: Secondary | ICD-10-CM

## 2017-03-31 DIAGNOSIS — R7982 Elevated C-reactive protein (CRP): Secondary | ICD-10-CM

## 2017-03-31 DIAGNOSIS — L508 Other urticaria: Secondary | ICD-10-CM

## 2017-03-31 DIAGNOSIS — M542 Cervicalgia: Secondary | ICD-10-CM | POA: Diagnosis not present

## 2017-03-31 DIAGNOSIS — G8929 Other chronic pain: Secondary | ICD-10-CM

## 2017-03-31 DIAGNOSIS — G43909 Migraine, unspecified, not intractable, without status migrainosus: Secondary | ICD-10-CM | POA: Diagnosis not present

## 2017-03-31 DIAGNOSIS — T781XXD Other adverse food reactions, not elsewhere classified, subsequent encounter: Secondary | ICD-10-CM

## 2017-03-31 DIAGNOSIS — F419 Anxiety disorder, unspecified: Secondary | ICD-10-CM

## 2017-03-31 DIAGNOSIS — N62 Hypertrophy of breast: Secondary | ICD-10-CM

## 2017-03-31 DIAGNOSIS — G894 Chronic pain syndrome: Secondary | ICD-10-CM | POA: Diagnosis not present

## 2017-03-31 MED ORDER — EPINEPHRINE 0.3 MG/0.3ML IJ SOAJ: 0.3000 mg | Freq: Once | INTRAMUSCULAR | 2 refills | Status: AC

## 2017-03-31 MED ORDER — ALPRAZOLAM 1 MG PO TABS: ORAL_TABLET | ORAL | 3 refills | Status: DC

## 2017-03-31 MED ORDER — OXYCODONE-ACETAMINOPHEN 10-325 MG PO TABS: ORAL_TABLET | ORAL | 0 refills | Status: DC

## 2017-03-31 NOTE — Patient Instructions (Addendum)
1. Allergic reactions with chronic hives - negative testing to tuna - Schedule an appointment for a tuna challenge in the office setting. - You will need to bring tuna in for the visit.  - Continue with start suppressive dosing of antihistamines:   - Morning: Zyrtec (cetirizine) 10-20mg  (tne to two tablets)  - Evening: Zyrtec (cetirizine) 10-20mg  (one to two tablets) - You can change this dosing at home, decreasing the dose as needed or increasing the dosing as needed.  - If you are not tolerating the medications or are tired of taking them every day, we can start treatment with a monthly injectable medication called Xolair.  - We will get a repeat CRP (meausre of inflammation) today to see where this level is.   2. Perennial and seasonal allergic rhinitis (trees, weeds, grasses, molds, dust mites and cat) - Testing today showed: trees, weeds, grasses, molds, dust mites and cat - Avoidance measures provided. - Continue with Flonase (fluticasone) two sprays per nostril daily - Continue Zyrtec (cetirizine) 10mg  in the morning and 10mg  at night - Add on Asteline nasal spray two sprays per nostril 1-2 times daily.  - Consider nasal saline rinses 1-2 times daily to remove allergens from the nasal cavities as well as help with mucous clearance (this is especially helpful to do before the nasal sprays are given) - Consider allergy shots as a means of long-term control. - Information on allergy shots provided.   3. Return in about 3 months (around 07/01/2017).   Please inform us of any Emergency Department visits, hospitalizations, or changes in symptoms. Call us before going to the ED for breathing or allergy symptoms since we might be able to fit you in for a sick visit. Feel free to contact us anytime with any questions, problems, or concerns.  It was a pleasure to see you again today! Enjoy the holiday season!  Websites that have reliable patient information: 1. American Academy of Asthma,  Allergy, and Immunology: www.aaaai.org 2. Food Allergy Research and Education (FARE): foodallergy.org 3. Mothers of Asthmatics: http://www.asthmacommunitynetwork.org 4. American College of Allergy, Asthma, and Immunology: www.acaai.org

## 2017-03-31 NOTE — Patient Instructions (Signed)
Let me see you in mid to late January

## 2017-03-31 NOTE — Progress Notes (Signed)
FOLLOW UP  Date of Service/Encounter:  03/31/17   Assessment:   Allergic urticaria  Adverse food reaction - with negative testing (skin and blood)  Seasonal and perennial allergic rhinitis (trees, weeds, grasses, molds, dust mites and cat)  Plan/Recommendations:   1. Allergic reactions with chronic hives - negative testing to tuna - Schedule an appointment for a tuna challenge in the office setting. - You will need to bring tuna in for the visit.  - Continue with start suppressive dosing of antihistamines:   - Morning: Zyrtec (cetirizine) 10-20mg  (tne to two tablets)  - Evening: Zyrtec (cetirizine) 10-20mg  (one to two tablets) - You can change this dosing at home, decreasing the dose as needed or increasing the dosing as needed.  - If you are not tolerating the medications or are tired of taking them every day, we can start treatment with a monthly injectable medication called Xolair.  - We will get a repeat CRP (meausre of inflammation) today to see where this level is.   2. Perennial and seasonal allergic rhinitis (trees, weeds, grasses, molds, dust mites and cat) - Testing today showed: trees, weeds, grasses, molds, dust mites and cat - Avoidance measures provided. - Continue with Flonase (fluticasone) two sprays per nostril daily - Continue Zyrtec (cetirizine) 10mg  in the morning and 10mg  at night - Add on Asteline nasal spray two sprays per nostril 1-2 times daily.  - Consider nasal saline rinses 1-2 times daily to remove allergens from the nasal cavities as well as help with mucous clearance (this is especially helpful to do before the nasal sprays are given) - Consider allergy shots as a means of long-term control. - Information on allergy shots provided and Consent signed. - The risks and benefits of aeroallergen immunotherapy have been discussed.  - The patient is motivated to initiate immunotherapy to reduce symptoms and decrease medication requirement.  - Informed  consent has been signed and allergen vaccine orders have been submitted.  - Medications will be decreased or discontinued as symptom relief from immunotherapy becomes evident.  3. Return in about 3 months (around 07/01/2017).   Subjective:   Crystal Garza is a 46 y.o. female presenting today for follow up of  Chief Complaint  Patient presents with  . Allergic Rhinitis     Crystal Garza has a history of the following: Patient Active Problem List   Diagnosis Date Noted  . Symptoms of gastroesophageal reflux 11/09/2016  . Esophageal dysphagia   . Nausea without vomiting   . Family history of colon cancer   . Dizziness 11/08/2015  . 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 Chronic neck and back pain 02/27/2015  . Other chest pain 12/10/2014  . Medication management 08/22/2014  . Hematuria 08/22/2014  . Pain in joint, ankle and foot 05/23/2014  . Chronic anticoagulation 05/17/2014  . Chronic thoracic back pain 04/11/2014  . Right flank pain 01/17/2014  . Degenerative arthritis of left knee 10/06/2013  . Status post total knee replacement 10/06/2013  . Edema 07/28/2013  . Chronic pain 06/13/2013  . 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 Bipolar disorder ([place]) 01/19/2012  . Nephrolithiasis 02/20/2011  . Eczematous dermatitis 10/22/2010  . Iron deficiency anemia 06/27/2010  . Anxiety 06/27/2010  . Migraine 09/12/2009  . Morbid obesity-BMI 62 10/30/2008  . Osteoarthrosis, unspecified whether generalized or localized, involving lower leg 07/05/2007  . DEPRESSION, MAJOR, RECURRENT 07/01/2006  . HYPERTENSION, BENIGN SYSTEMIC 07/01/2006  .  RHINITIS, ALLERGIC 07/01/2006  . Gastroesophageal reflux disease 07/01/2006    History obtained from: chart review and patient.  Crystal Garza's Primary Care Provider is Dickie La, MD.     Crystal Garza is a 46 y.o. female presenting for a follow up visit. She was last seen in  August 2018. At that time, she was having chronic urticaria. We did do environmental allergy testing that demonstrated sensitizations to trees, weeds, grasses, molds, dust mites, and cat. We continued her on Flonase two sprays per nostril daily and started cetirizine 10mg  daily. 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 We did testing, which was negative; this was confirmed with blood testing which was also negative.   Since the last visit, she has done well. She continues to have hives but overall they are stable. She is taking the cetirizine, up to twice daily. She is now decreased to one daily. They did seem to worsen but she is planning to increase it back to twice daily. Currently hives are affecting her arms but there are none present today. She has no problems with residual hyperpigmentation or fevers. She denies arthritis but does have joint pain, which she feels is secondary to her   She is now having some runny nose. She recently went to a new client's home which seems to make it worse. She is not using a nose spray at all. She has Flonase but she has not been using it at all. She has considered allergy shots and would be interested in starting this.   Otherwise, there have been no changes to her past medical history, surgical history, family history, or social history.    Review of Systems: a 14-point review of systems is pertinent for what is mentioned in HPI.  Otherwise, all other systems were negative. Constitutional: negative other than that listed in the HPI Eyes: negative other than that listed in the HPI Ears, nose, mouth, throat, and face: negative other than that listed in the HPI Respiratory: negative other than that listed in the HPI Cardiovascular: negative other than that listed in the HPI Gastrointestinal: negative other than that listed in the HPI Genitourinary: negative other than that listed in the HPI Integument: negative other than that listed in the  HPI Hematologic: negative other than that listed in the HPI Musculoskeletal: negative other than that listed in the HPI Neurological: negative other than that listed in the HPI Allergy/Immunologic: negative other than that listed in the HPI    Objective:   Blood pressure 122/76, pulse 97, resp. rate 19, SpO2 93 %. There is no height or weight on file to calculate BMI.   Physical Exam:  General: Alert, interactive, in no acute distress. Obese female. Talkative.  Eyes: No conjunctival injection bilaterally, no discharge on the right, no discharge on the left and no Horner-Trantas dots present. PERRL bilaterally. EOMI without pain. No photophobia.  Ears: Right TM pearly gray with normal light reflex, Left TM pearly gray with normal light reflex, Right TM intact without perforation and Left TM intact without perforation.  Nose/Throat: External nose within normal limits and septum midline. Turbinates markedly edematous and pale with clear discharge. Posterior oropharynx erythematous with cobblestoning in the posterior oropharynx. Tonsils 2+ without exudates.  Tongue without thrush. Adenopathy: shoddy bilateral anterior cervical lymphadenopathy and no enlarged lymph nodes appreciated in the occipital, axillary, epitrochlear, inguinal, or popliteal regions. Lungs: Clear to auscultation without wheezing, rhonchi or rales. No increased work of breathing. CV: Normal S1/S2. No murmurs.  Capillary refill <2 seconds.  Skin: Warm and dry, without lesions or rashes. Neuro:   Grossly intact. No focal deficits appreciated. Responsive to questions.  Diagnostic studies: none    Salvatore Marvel, MD [facility]

## 2017-04-01 ENCOUNTER — Encounter: Payer: Self-pay | Admitting: Neurology

## 2017-04-01 LAB — C-REACTIVE PROTEIN: CRP: 18.2 mg/L — ABNORMAL HIGH (ref 0.0–4.9)

## 2017-04-02 NOTE — Assessment & Plan Note (Signed)
Referral to headache clinic.

## 2017-04-02 NOTE — Progress Notes (Signed)
    CHIEF COMPLAINT / HPI:   Follow-up neck pain and headaches. No better. Starts posterior neck and radiates upward to the crown of her head. Having in 4-5 days out of the week. Nothing seems to be helping.  Has some of her insurance issues straightened out was to consider evaluation again for breast reduction surgery.  Knee pain unchanged.  REVIEW OF SYSTEMS:  No unusual weight change. No fever. Has had no visual changes with her headaches.  PERTINENT  PMH / PSH: I have reviewed the patient's medications, allergies, past medical and surgical history, smoking status and updated in the EMR as appropriate.   OBJECTIVE:     Vital signs reviewed. GENERAL: Well-developed, well-nourished, no acute distress. CARDIOVASCULAR: Regular rate and rhythm no murmur gallop or rub LUNGS: Clear to auscultation bilaterally, no rales or wheeze. ABDOMEN: Soft positive bowel sounds NEURO: No gross focal neurological deficits. MSK: Movement of extremity x 4.    ASSESSMENT / PLAN: Please see problem oriented charting for details

## 2017-04-02 NOTE — Assessment & Plan Note (Signed)
Medicines refilled. Database reviewed.

## 2017-04-02 NOTE — Assessment & Plan Note (Signed)
Continue her current medication regimen. I will refill her Xanax. I'm also handling her risperidone.

## 2017-04-02 NOTE — Assessment & Plan Note (Signed)
I will put a referral back in 4 plastic surgeon for consideration of breast reduction for her macromastia

## 2017-04-06 NOTE — Addendum Note (Signed)
Addended by: Valentina Shaggy on: 04/06/2017 08:42 AM   Modules accepted: Orders

## 2017-04-29 ENCOUNTER — Telehealth: Payer: Self-pay | Admitting: Family Medicine

## 2017-04-29 ENCOUNTER — Encounter (INDEPENDENT_AMBULATORY_CARE_PROVIDER_SITE_OTHER): Payer: Self-pay | Admitting: Physical Medicine and Rehabilitation

## 2017-04-29 DIAGNOSIS — G8929 Other chronic pain: Secondary | ICD-10-CM

## 2017-04-29 DIAGNOSIS — M546 Pain in thoracic spine: Principal | ICD-10-CM

## 2017-04-29 NOTE — Telephone Encounter (Signed)
Pt is calling and would like the doctor to put in a new referral to Dr. Charlestine Night. The allergist wants her to see a rheumatologist  And she had seen Dr. Charlestine Night a few years ago. jw

## 2017-04-29 NOTE — Telephone Encounter (Signed)
Dear [name] Team OK I have put in a referral to Rheumatology. Please let her know THANKS! [name]

## 2017-04-30 NOTE — Telephone Encounter (Signed)
Pt informed. Sharon T Saunders, CMA  

## 2017-05-06 ENCOUNTER — Telehealth: Payer: Self-pay | Admitting: Gastroenterology

## 2017-05-06 NOTE — Telephone Encounter (Signed)
I have reviewed her chart. She had a prior pH study while on Dexilant showing good control of heartburn. I don't think she should be taking Dexilant twice daily, the extra dose is likely not providing any benefit. She should stay on once daily dosing. I had placed her on desipramine previously for functional heartburn / pain, not sure if she is still taking this at 10mg . She can increase to 20mg  if tolerated. Agree with your recommendation about Miralax for her constipation. I will await her visit with Anderson Malta and discuss her case at that time. thanks

## 2017-05-06 NOTE — Telephone Encounter (Signed)
Patient states she is taking dexilant 60 mg BID, she is also having abdominal pain with bm's. States she is constipated, but if she takes Miralax 17 gm then has loose stools. Advised to try 1/2 dose of Miralax to see if this will help with her constipation but keep stools formed. She does have an appointment with Anderson Malta L. on 05/20/17, patient was supposed to have appointment with Dr. Havery Moros on 12/24/16 but unfortunately No Showed.

## 2017-05-07 NOTE — Telephone Encounter (Signed)
Left detailed message for patient to only take Dexilant one tablet once a day, she should be on desipramine 10 mg at bedtime may increase to 20 mg if tolerating the lower dosage. Otherwise, keep appointment with Anderson Malta.

## 2017-05-19 ENCOUNTER — Other Ambulatory Visit: Payer: Self-pay | Admitting: Family Medicine

## 2017-05-19 ENCOUNTER — Other Ambulatory Visit: Payer: Self-pay

## 2017-05-19 ENCOUNTER — Ambulatory Visit (INDEPENDENT_AMBULATORY_CARE_PROVIDER_SITE_OTHER): Payer: Medicare Other | Admitting: Family Medicine

## 2017-05-19 ENCOUNTER — Encounter: Payer: Self-pay | Admitting: Family Medicine

## 2017-05-19 ENCOUNTER — Telehealth: Payer: Self-pay

## 2017-05-19 VITALS — BP 116/74 | HR 73 | Temp 98.5°F | Ht 67.0 in | Wt 345.4 lb

## 2017-05-19 DIAGNOSIS — R932 Abnormal findings on diagnostic imaging of liver and biliary tract: Secondary | ICD-10-CM

## 2017-05-19 DIAGNOSIS — Z7901 Long term (current) use of anticoagulants: Secondary | ICD-10-CM | POA: Diagnosis not present

## 2017-05-19 DIAGNOSIS — N62 Hypertrophy of breast: Secondary | ICD-10-CM

## 2017-05-19 DIAGNOSIS — R0789 Other chest pain: Secondary | ICD-10-CM

## 2017-05-19 DIAGNOSIS — Z96652 Presence of left artificial knee joint: Secondary | ICD-10-CM

## 2017-05-19 MED ORDER — OXYCODONE-ACETAMINOPHEN 10-325 MG PO TABS: ORAL_TABLET | ORAL | 0 refills | Status: DC

## 2017-05-19 NOTE — Telephone Encounter (Signed)
Pt says [facility] did not receive the RX for her pain meds.  Please resend

## 2017-05-19 NOTE — Telephone Encounter (Signed)
Patient left message on nurse line stating [facility] did not receive percocet Rx. Hubbard Hartshorn, RN, BSN

## 2017-05-19 NOTE — Patient Instructions (Signed)
I have placed a referral to the plastic surgeon, Dr. Elisabeth Cara at [facility].  I have also placed a referral for cardiology.  I would like to see you 1 or 2 weeks after you see the cardiologist.  I am calling in your pain medicine through the new E prescribing method.  If there is a problem with it, please call the office and let me know immediately.

## 2017-05-19 NOTE — Assessment & Plan Note (Signed)
Referral to plastic surgery.  Long-standing macromastia with chronic back pain that I think would improve.  I wonder if this is also contributing to her chest pain.

## 2017-05-19 NOTE — Assessment & Plan Note (Signed)
Stable.  Continue current medications.

## 2017-05-19 NOTE — Assessment & Plan Note (Signed)
Doing relatively well on her current pain medicine regimen.  No episodes of red flags for pain management.  He prescription sent today.

## 2017-05-19 NOTE — Telephone Encounter (Signed)
Left message for patient that one year follow up ultrasound of gallbladder is scheduled for 05/24/17 at [facility], prep instructions printed to be given to patient at appointment tomorrow.

## 2017-05-19 NOTE — Telephone Encounter (Signed)
-----   Message from Doristine Counter, RN sent at 05/14/2016  4:14 PM EST ----- Schedule 1 year f/u ultrasound

## 2017-05-19 NOTE — Progress Notes (Signed)
    CHIEF COMPLAINT / HPI: Left-sided chest pain.  Has had this intermittently for months but over the last 2-3 weeks it has been much more constant.  She thinks it is related to the muscles in her chest because it gets worse with certain movements, but there is a new symptom of some pressure type sensation that does not seem to change with movement.  It also is worse after a long day does not awaken her at night.  It does not seem to be related to exertion, no relation to the motion, no relation to eating.  Not associated with sweats or dizziness.  Is also noticed some ankle swelling in the last 2 weeks it is new for her.  She is a little concerned because her father had significant CHF and he started with some mild chest pain and swelling.  #2.  Continued back pains.  Due to insurance reasons she was never able to get in with the plastic surgeon to get evaluation for her breast reduction.  We had been working on that the last year.  Long-standing history of mid and upper back pain pendulous breasts.  #3.  Chronic knee pain.  Status post unilateral knee replacement.  He continues to have pain in both knees, worse with standing long periods of time, worse with stair climbing. 4.  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  No episodes of bleeding.  Tolerating medicines well.  REVIEW OF SYSTEMS: See HPI.  Additional pertinent review of systems is negative for fever, no unusual weight change.  No abdominal pain, no diarrhea.  PERTINENT  PMH / PSH: I have reviewed the patient's medications, allergies, past medical and surgical history, smoking status and updated in the EMR as appropriate.   OBJECTIVE: GENERAL: Well-developed overweight female no acute distress BACK: Mid and upper back muscle spasm bilaterally particularly in the rhomboid area and this area is tender to palpation.  She also has some tenderness across the trap PZ is bilaterally.  CV: Regular rate and  rhythm without murmur LUNGS: Clear to auscultation bilaterally abdomen: Soft, positive bowel sounds EXTREMITY: Trace edema nonpitting bilateral ankles.  Calves are soft.  Movement of extremity x4 with normal strength.  Ambulates without assistive device. Psychiatric: Alert and oriented x4.  Affect is interactive.  Recent remote memory is intact.  Judgment is intact.  ASSESSMENT / PLAN: 1.  Left-sided chest pain.  Think most of this is musculoskeletal related to her macromastia however she does have a slight increase in her symptoms the last few weeks, she is complaining of some mild lower extremity edema although I do not see any on exam today, and she  mentioned a new symptom of some pressure-like sensation.  Will refer her to cardiology for evaluation.  She has never had an echocardiogram.  She has multiple risk factors.  I will see her back 1-2 weeks after that and if she continues to have chest pain with a negative cardiac workup, reevaluate for next steps.    Please see problem oriented charting for details

## 2017-05-20 ENCOUNTER — Ambulatory Visit (INDEPENDENT_AMBULATORY_CARE_PROVIDER_SITE_OTHER): Payer: Medicare Other | Admitting: Physician Assistant

## 2017-05-20 ENCOUNTER — Other Ambulatory Visit: Payer: Self-pay

## 2017-05-20 ENCOUNTER — Encounter: Payer: Self-pay | Admitting: Physician Assistant

## 2017-05-20 VITALS — BP 130/72 | HR 80 | Ht 67.0 in | Wt 347.2 lb

## 2017-05-20 DIAGNOSIS — K2100 Gastro-esophageal reflux disease with esophagitis, without bleeding: Secondary | ICD-10-CM

## 2017-05-20 DIAGNOSIS — K5900 Constipation, unspecified: Secondary | ICD-10-CM | POA: Diagnosis not present

## 2017-05-20 DIAGNOSIS — R0789 Other chest pain: Secondary | ICD-10-CM | POA: Diagnosis not present

## 2017-05-20 LAB — LIPID PANEL
Chol/HDL Ratio: 3.3 ratio (ref 0.0–4.4)
Cholesterol, Total: 170 mg/dL (ref 100–199)
HDL: 51 mg/dL (ref >39)
LDL Calculated: 103 mg/dL — ABNORMAL HIGH (ref 0–99)
Triglycerides: 82 mg/dL (ref 0–149)
VLDL Cholesterol Cal: 16 mg/dL (ref 5–40)

## 2017-05-20 LAB — COMPREHENSIVE METABOLIC PANEL
ALT: 7 IU/L (ref 0–32)
AST: 14 IU/L (ref 0–40)
Albumin/Globulin Ratio: 1.3 (ref 1.2–2.2)
Albumin: 3.9 g/dL (ref 3.5–5.5)
Alkaline Phosphatase: 62 IU/L (ref 39–117)
BUN/Creatinine Ratio: 12 (ref 9–23)
BUN: 8 mg/dL (ref 6–24)
Bilirubin Total: 0.3 mg/dL (ref 0.0–1.2)
CO2: 21 mmol/L (ref 20–29)
Calcium: 9 mg/dL (ref 8.7–10.2)
Chloride: 103 mmol/L (ref 96–106)
Creatinine, Ser: 0.65 mg/dL (ref 0.57–1.00)
GFR calc Af Amer: 121 mL/min/{1.73_m2} (ref >59)
GFR calc non Af Amer: 105 mL/min/{1.73_m2} (ref >59)
Globulin, Total: 3 g/dL (ref 1.5–4.5)
Glucose: 79 mg/dL (ref 65–99)
Potassium: 4.6 mmol/L (ref 3.5–5.2)
Sodium: 141 mmol/L (ref 134–144)
Total Protein: 6.9 g/dL (ref 6.0–8.5)

## 2017-05-20 LAB — CBC
Hematocrit: 39.2 % (ref 34.0–46.6)
Hemoglobin: 12.2 g/dL (ref 11.1–15.9)
MCH: 26.1 pg — ABNORMAL LOW (ref 26.6–33.0)
MCHC: 31.1 g/dL — ABNORMAL LOW (ref 31.5–35.7)
MCV: 84 fL (ref 79–97)
Platelets: 465 10*3/uL — ABNORMAL HIGH (ref 150–379)
RBC: 4.67 x10E6/uL (ref 3.77–5.28)
RDW: 15.7 % — ABNORMAL HIGH (ref 12.3–15.4)
WBC: 10.4 10*3/uL (ref 3.4–10.8)

## 2017-05-20 MED ORDER — DESIPRAMINE HCL 10 MG PO TABS: 10.0000 mg | ORAL_TABLET | Freq: Every day | ORAL | 3 refills | Status: DC

## 2017-05-20 NOTE — Progress Notes (Signed)
Chief Complaint: Constipation, abdominal pain, GERD  HPI:    [name] is a 47 year old African-American female with a past medical history as listed below, who returns to clinic today for follow-up of her chronic reflux as well as abdominal pain and constipation.     Please recall patient follows with Dr. Havery Moros and was last seen in clinic by me 11/11/16.  Her extensive workup for reflux was reviewed including esophageal manometry and 24-hour pH impedance study.  At that time the patient continued with a "burning in my esophagus".  She was continued on Dexilant 60 mg once daily as well as Carafate suspension 4 times daily.  Her Zantac and Mylanta were discontinued and she was told to use these only as needed for symptoms.  Dr. Havery Moros suggested that possibly she could start a TCA, however she was on multiple medications which could interact with this.  This was to be discussed at her next visit.    Today, the patient presents clinic accompanied by her young granddaughter.  She tells me that her symptoms have not really changed as far as the epigastric burning sensation.  She continues on her Dexilant and Carafate.  Patient continues to believe that this has something to do with her gastric sleeve procedure.  She does describe some reflux symptoms as well.    Patient tells me today that she is constipated, this is a long-standing problem for her.  She has been trying to use MiraLAX in order to resolve this but if she uses 1 dose this is "not enough" and she uses 2 doses this is "too much".  Patient tells me if she uses 2 doses she will have one urgent loose bowel movement a day.  Patient tells me this is inconvenient if she is not at home.  She has not tried any stool softeners or other laxatives.    Of note patient does describe a chest pain which started last night and worsened, with nausea and tingling which radiates down into her left arm this morning.  She tells me that she was watching her  grandchildren last night and could not go to the ER so she just took some pain medicine.  She continues with chest pain today and also describes associated shortness of breath.    Patient denies fever, chills, blood in her stool, melena, dizziness, syncope, nausea or vomiting.  Past Medical History:  Diagnosis Date  . Anemia    iron def  . Anginal pain ([place])   . Anxiety   . Arthritis    left knee   . Back pain   . Benign gastrointestinal stromal tumor (GIST)   . Chronic knee pain   . Depression   . DVT (deep venous thrombosis) ([place])   . GERD (gastroesophageal reflux disease)   . Hx of laparoscopic gastric banding   . Hypertension   . Kidney stones   . Migraine   . Morbid obesity ([place])   . PE (pulmonary embolism)     Past Surgical History:  Procedure Laterality Date  . [place] STUDY N/A 05/13/2016   Procedure: [place] STUDY;  Surgeon: Manus Gunning, MD;  Location: [facility];  Service: Gastroenterology;  Laterality: N/A;  . ABDOMINAL HYSTERECTOMY    . BIOPSY  02/16/2012   Procedure: BIOPSY;  Surgeon: [name], MD;  Location: [facility];  Service: General;;  biopsy of mass x 2  . BREATH TEK H PYLORI  07/28/2011   Procedure: BREATH  TEK H PYLORI;  Surgeon: [name], MD;  Location: [facility];  Service: General;  Laterality: N/A;  to be done at 745  . CESAREAN SECTION  05/1991  . COLONOSCOPY WITH PROPOFOL N/A 03/03/2016   Procedure: COLONOSCOPY WITH PROPOFOL;  Surgeon: Manus Gunning, MD;  Location: [facility];  Service: Gastroenterology;  Laterality: N/A;  . ESOPHAGEAL MANOMETRY N/A 05/13/2016   Procedure: ESOPHAGEAL MANOMETRY (EM);  Surgeon: Manus Gunning, MD;  Location: [facility];  Service: Gastroenterology;  Laterality: N/A;  . ESOPHAGOGASTRODUODENOSCOPY N/A 08/29/2012   Procedure: ESOPHAGOGASTRODUODENOSCOPY (EGD);  Surgeon: [name], MD;  Location: [facility];  Service: General;  Laterality: N/A;  .  ESOPHAGOGASTRODUODENOSCOPY N/A 09/04/2014   Procedure: ESOPHAGOGASTRODUODENOSCOPY (EGD);  Surgeon: Alphonsa Overall, MD;  Location: [facility];  Service: General;  Laterality: N/A;  . ESOPHAGOGASTRODUODENOSCOPY (EGD) WITH PROPOFOL N/A 03/03/2016   Procedure: ESOPHAGOGASTRODUODENOSCOPY (EGD) WITH PROPOFOL;  Surgeon: Manus Gunning, MD;  Location: [facility];  Service: Gastroenterology;  Laterality: N/A;  . EUS  03/03/2012   Procedure: UPPER ENDOSCOPIC ULTRASOUND (EUS) LINEAR;  Surgeon: Milus Banister, MD;  Location: [facility];  Service: Endoscopy;  Laterality: N/A;  . gist    . KNEE ARTHROSCOPY    . LAPAROSCOPIC GASTRECTOMY  04/08/2012   Procedure: LAPAROSCOPIC GASTRECTOMY;  Surgeon: [name], MD;  Location: [facility];  Service: General;;  removal of GIST tumor of stomach  . LAPAROSCOPIC GASTRIC SLEEVE RESECTION N/A 08/29/2012   Procedure: LAPAROSCOPIC GASTRIC SLEEVE RESECTION;  Surgeon: [name], MD;  Location: [facility];  Service: General;  Laterality: N/A;  Sleeve Gastrectomy  . TOTAL KNEE ARTHROPLASTY Left 10/06/2013   Procedure: LEFT TOTAL KNEE ARTHROPLASTY;  Surgeon: Mcarthur Rossetti, MD;  Location: [facility];  Service: Orthopedics;  Laterality: Left;  . TUBAL LIGATION      Current Outpatient Medications  Medication Sig Dispense Refill  . ALPRAZolam (XANAX) 1 MG tablet Take one by mouth twice daily as needed 60 tablet 3  . desipramine (NORPRAMIN) 10 MG tablet Take 1 tablet (10 mg total) by mouth at bedtime. 30 tablet 0  . dexlansoprazole (DEXILANT) 60 MG capsule Take 1 capsule (60 mg total) by mouth daily. 90 capsule 3  . hydrOXYzine (VISTARIL) 25 MG capsule Take 1 capsule (25 mg total) by mouth at bedtime. 30 capsule 2  . ibuprofen (ADVIL,MOTRIN) 600 MG tablet Take 1 tablet (600 mg total) by mouth every 6 (six) hours as needed. 30 tablet 0  . lamoTRIgine (LAMICTAL) 200 MG tablet Take 1 tablet (200 mg total) by mouth at bedtime. 90 tablet 3  . lisinopril  (PRINIVIL,ZESTRIL) 10 MG tablet Take 1 tablet (10 mg total) by mouth daily. 90 tablet 3  . methenamine (HIPREX) 1 g tablet Take 1 g 2 (two) times daily with a meal by mouth.     . mupirocin ointment (BACTROBAN) 2 % Apply t o affected skin area twice daily as directed 60 g 0  . nystatin cream (MYCOSTATIN) APPLY TOPICALLY 2 TIMES A DAY (Patient taking differently: Apply 1 application topically 2 (two) times daily as needed for dry skin. ) 60 g 5  . ondansetron (ZOFRAN-ODT) 4 MG disintegrating tablet Take 4 mg by mouth every 8 (eight) hours as needed for nausea.     Marland Kitchen oxyCODONE-acetaminophen (PERCOCET) 10-325 MG tablet Take one by mouth three times a day as directed for chronic neck and leg pain 30 day supply do not filll before May 24 2017 90 tablet 0  . polyethylene  glycol powder (GLYCOLAX/MIRALAX) powder Taper up to 1 or 2 scoops daily (Patient taking differently: Take 1 Container daily as needed by mouth for mild constipation or moderate constipation. Taper up to 1 or 2 scoops daily) 3350 g 4  . risperiDONE (RISPERDAL) 1 MG tablet Take 1 tablet (1 mg total) by mouth at bedtime. 90 tablet 3  . rivaroxaban (XARELTO) 20 MG TABS tablet Take 1 tablet (20 mg total) by mouth daily with supper. 90 tablet 3  . tiZANidine (ZANAFLEX) 4 MG tablet Take 1 tablet (4 mg total) by mouth 2 (two) times daily as needed for muscle spasms. 60 tablet 12  . VESICARE 5 MG tablet Take 5 mg daily by mouth.      No current facility-administered medications for this visit.     Allergies as of 05/20/2017 - Review Complete 05/20/2017  Allergen Reaction Noted  . Codeine Rash     Family History  Problem Relation Age of Onset  . Diabetes Mother   . Diabetes Father   . Heart disease Father   . Colon cancer Father        brain tumor  . Colon polyps Father   . Colitis Father   . Irritable bowel syndrome Father   . Diabetes Maternal Grandmother   . Diabetes Maternal Grandfather   . Schizophrenia Maternal Grandfather   .  Heart disease Paternal Uncle   . Diabetes Paternal Uncle   . Heart disease Paternal Grandmother   . Heart disease Paternal Grandfather   . Diabetes Maternal Aunt   . Diabetes Maternal Uncle   . Diabetes Paternal Aunt     Social History   Socioeconomic History  . Marital status: Single    Spouse name: Not on file  . Number of children: Not on file  . Years of education: Not on file  . Highest education level: Not on file  Social Needs  . Financial resource strain: Not on file  . Food insecurity - worry: Not on file  . Food insecurity - inability: Not on file  . Transportation needs - medical: Not on file  . Transportation needs - non-medical: Not on file  Occupational History  . Not on file  Tobacco Use  . Smoking status: Never Smoker  . Smokeless tobacco: Never Used  Substance and Sexual Activity  . Alcohol use: No    Alcohol/week: 0.0 oz    Frequency: Never  . Drug use: No  . Sexual activity: Not on file  Other Topics Concern  . Not on file  Social History Narrative   Patient has been on disability since 2004 for knee pain, back pain, depression and acid reflux. She does work part-time now.       She is caring for her granddaughter Amia age 24 (has sickle cell, goes to [facility] every month and has had multiple hospitalizations).     Review of Systems:    Constitutional: No weight loss, fever or chills Cardiovascular:Positive for Chest pain  Respiratory: Positive for SOB Gastrointestinal: See HPI and otherwise negative   Physical Exam:  Vital signs: BP 130/72   Pulse 80   Ht 5\' 7"  (1.702 m)   Wt (!) 347 lb 3.2 oz (157.5 kg)   BMI 54.38 kg/m   Constitutional:   Pleasant obese African-American female appears to be in NAD, Well developed, Well nourished, alert and cooperative Respiratory: Respirations even and unlabored. Lungs clear to auscultation bilaterally.   No wheezes, crackles, or rhonchi.  Cardiovascular: Normal S1, S2.  No MRG. Regular rate and rhythm. No  peripheral edema, cyanosis or pallor.  Gastrointestinal:  Soft, nondistended, mild generalized ttp No rebound or guarding. Normal bowel sounds. No appreciable masses or hepatomegaly. Psychiatric:  Demonstrates good judgement and reason without abnormal affect or behaviors.  MOST RECENT LABS AND IMAGING: CBC    Component Value Date/Time   WBC 10.4 05/19/2017 0920   WBC 13.1 (H) 11/16/2016 1802   RBC 4.67 05/19/2017 0920   RBC 4.88 11/16/2016 1802   HGB 12.2 05/19/2017 0920   HCT 39.2 05/19/2017 0920   PLT 465 (H) 05/19/2017 0920   MCV 84 05/19/2017 0920   MCH 26.1 (L) 05/19/2017 0920   MCH 26.6 11/16/2016 1802   MCHC 31.1 (L) 05/19/2017 0920   MCHC 31.9 11/16/2016 1802   RDW 15.7 (H) 05/19/2017 0920   LYMPHSABS 4.1 (H) 11/05/2016 1541   MONOABS 0.6 04/11/2016 0704   EOSABS 0.1 11/05/2016 1541   BASOSABS 0.1 11/05/2016 1541    CMP     Component Value Date/Time   NA 141 05/19/2017 0920   K 4.6 05/19/2017 0920   CL 103 05/19/2017 0920   CO2 21 05/19/2017 0920   GLUCOSE 79 05/19/2017 0920   GLUCOSE 91 11/16/2016 1802   BUN 8 05/19/2017 0920   CREATININE 0.65 05/19/2017 0920   CREATININE 0.76 08/22/2014 0910   CALCIUM 9.0 05/19/2017 0920   PROT 6.9 05/19/2017 0920   ALBUMIN 3.9 05/19/2017 0920   AST 14 05/19/2017 0920   ALT 7 05/19/2017 0920   ALKPHOS 62 05/19/2017 0920   BILITOT 0.3 05/19/2017 0920   GFRNONAA 105 05/19/2017 0920   GFRAA 121 05/19/2017 0920    Assessment: 1.  GERD: Continues for the patient with an epigastric burning spot, again this is worse after gastric sleeve placement, no better with Dexilant; suspect there is a functional component 2.  Constipation: Uncontrolled on MiraLAX 3.  Chest pain: Started last night, radiating to patient's left arm with nausea and shortness of breath; concern for acute cardiac event  Plan: 1.  Urged the patient to proceed to the ER and tell them she is experiencing chest pain radiating to her left arm with nausea and  short shortness of breath.  She needs to have an EKG to ensure that she is not having any acute ischemic events.  She verbalized understanding. 2.  Recommend the patient discontinue MiraLAX as it seems to be too strong for her.  She can try taking Colace or Dulcolax 2 tabs once daily.  She will call and let us know how this works for her. 3.  Discussed with the patient that Dr. Havery Moros suggested putting her on a TCA.  I will communicate with him to see exactly how he would like to do this/which one he would like to use. 4.  Patient to continue her [place] for now. 5.  Again it may not be a bad idea for the patient to follow with her gastric sleeve surgeon, as all the symptoms seem to start after her procedure. 6.  Patient to follow in clinic with Dr. Havery Moros in 4-6 weeks or sooner if necessary.  Ellouise Newer, PA-C [facility] 05/20/2017, 9:35 AM  Cc: Dickie La, MD

## 2017-05-20 NOTE — Progress Notes (Signed)
Can you call patient and see if she is taking Desipramine 10mg  qhs? She was supposed to tirate to 20mg  qhs, if she has not done this she should, if she did then she should increase to 30mg . Thanks-jll

## 2017-05-20 NOTE — Progress Notes (Signed)
I spoke with the pt and she has not been on desipramine since July.  She was sent in a new prescription per Anderson Malta for 10 mg and will call in a few weeks to update.

## 2017-05-20 NOTE — Patient Instructions (Addendum)
Stop Miralax.   Start Colace or Dulcolax 2 tabs daily.   Continue Dexilant.

## 2017-05-20 NOTE — Progress Notes (Signed)
Agree with assessment and plan as outlined. This patient has had an extensive evaluation with PH testing, manometry, and EGD normal.  Specifically, on PPI her Demeester score is normal and her symptoms did NOT correlate with reflux. In this light her diagnosis is functional heartburn. I previously placed her on a TCA (Desipramine) at 10mg  q HS and was supposed to titrate up to 20mg  qHS, after I had spoke with her primary care about this.  Anderson Malta can you confirm if she was taking Desipramine, and if so, what dose? I would escalate to 30mg  if tolerated. If higher dosing that than is needed she will need to follow up with her primary care given her other psychiatric medications.   In regards to her constipation, we could try low dose Linzess at 53mcg if she has not tried that, if colace and dulcolax does not help.  Agree she needs EKG and evaluation for chest pain if new today.

## 2017-05-21 NOTE — Telephone Encounter (Signed)
LVM for pt to call back to inform her of below. Zimmerman Rumple, Natanael Saladin D, CMA  

## 2017-05-21 NOTE — Telephone Encounter (Signed)
Dear Crystal Garza team, please let her know that they did receive the prescription.  They were confused when she called.  She was not due for the actual fill until Monday so eScription is there but she can get filled Monday.  I called and checked  [name]

## 2017-05-24 ENCOUNTER — Ambulatory Visit (HOSPITAL_COMMUNITY)
Admission: RE | Admit: 2017-05-24 | Discharge: 2017-05-24 | Disposition: A | Discharge status: Home / Self Care | Payer: Medicare Other | Source: Ambulatory Visit | Attending: Gastroenterology | Admitting: Gastroenterology

## 2017-05-24 DIAGNOSIS — R932 Abnormal findings on diagnostic imaging of liver and biliary tract: Secondary | ICD-10-CM

## 2017-05-24 DIAGNOSIS — K824 Cholesterolosis of gallbladder: Secondary | ICD-10-CM | POA: Insufficient documentation

## 2017-05-25 ENCOUNTER — Ambulatory Visit (INDEPENDENT_AMBULATORY_CARE_PROVIDER_SITE_OTHER): Payer: Medicare Other | Admitting: Cardiology

## 2017-05-25 ENCOUNTER — Other Ambulatory Visit: Payer: Self-pay | Admitting: Family Medicine

## 2017-05-25 ENCOUNTER — Encounter: Payer: Self-pay | Admitting: Cardiology

## 2017-05-25 ENCOUNTER — Other Ambulatory Visit: Payer: Self-pay

## 2017-05-25 ENCOUNTER — Ambulatory Visit (HOSPITAL_BASED_OUTPATIENT_CLINIC_OR_DEPARTMENT_OTHER)
Admission: RE | Admit: 2017-05-25 | Discharge: 2017-05-25 | Disposition: A | Discharge status: Home / Self Care | Payer: Medicare Other | Source: Ambulatory Visit | Attending: Cardiology | Admitting: Cardiology

## 2017-05-25 VITALS — BP 124/72 | HR 89 | Ht 67.0 in | Wt 346.1 lb

## 2017-05-25 DIAGNOSIS — Z1231 Encounter for screening mammogram for malignant neoplasm of breast: Secondary | ICD-10-CM

## 2017-05-25 DIAGNOSIS — I10 Essential (primary) hypertension: Secondary | ICD-10-CM | POA: Diagnosis not present

## 2017-05-25 DIAGNOSIS — I209 Angina pectoris, unspecified: Secondary | ICD-10-CM | POA: Insufficient documentation

## 2017-05-25 DIAGNOSIS — Z86711 Personal history of pulmonary embolism: Secondary | ICD-10-CM

## 2017-05-25 DIAGNOSIS — R079 Chest pain, unspecified: Secondary | ICD-10-CM | POA: Diagnosis not present

## 2017-05-25 MED ORDER — ASPIRIN EC 81 MG PO TBEC: 162.0000 mg | DELAYED_RELEASE_TABLET | Freq: Every day | ORAL | 3 refills | Status: DC

## 2017-05-25 MED ORDER — NITROGLYCERIN 0.4 MG SL SUBL: 0.4000 mg | SUBLINGUAL_TABLET | SUBLINGUAL | 6 refills | Status: DC | PRN

## 2017-05-25 NOTE — H&P (View-Only) (Signed)
Cardiology Office Note:    Date:  05/25/2017   ID:  Crystal Garza, DOB 05-Apr-1971, MRN [id number]  PCP:  Dickie La, MD  Cardiologist:  Jenean Lindau, MD   Referring MD: Dickie La, MD    ASSESSMENT:    1. Angina pectoris ([place])   2. HYPERTENSION, BENIGN SYSTEMIC   3. Morbid obesity-BMI 62   4. History of pulmonary embolism    PLAN:    In order of problems listed above:  1. Primary prevention stressed with the patient.  Importance of compliance with diet and medications stressed and she vocalized understanding.  Risks of obesity explained in diet was discussed and she plans to follow this religiously and meticulously and lose weight. 2. Sublingual nitroglycerin prescription was sent, its protocol and 911 protocol explained and the patient vocalized understanding questions were answered to the patient's satisfaction 3. In view of the patient's symptoms, I discussed with the patient options for evaluation. Invasive and noninvasive options were given to the patient. I discussed stress testing and coronary angiography and left heart catheterization at length. Benefits, pros and cons of each approach were discussed at length. Patient had multiple questions which were answered to the patient's satisfaction. Patient opted for invasive evaluation and we will set up for coronary angiography and left heart catheterization. Further recommendations will be made based on the findings with coronary angiography. In the interim if the patient has any significant symptoms in hospital to the nearest emergency room. 4. She has been advised to stop Xarelto the night previous to the procedure.  If the interventional cardiologist, is any other choice or prefers a longer period of time and I would leave it up to him.    Medication Adjustments/Labs and Tests Ordered: Current medicines are reviewed at length with the patient today.  Concerns regarding medicines are outlined above.  No orders of the  defined types were placed in this encounter.  No orders of the defined types were placed in this encounter.    History of Present Illness:    Crystal Garza is a 47 y.o. female who is being seen today for the evaluation of chest pain on exertion at the request of Dickie La, MD.  Patient is a pleasant 47 year old female.  She has past medical history of essential hypertension, DVT in the lower extremity after knee replacement in 2015 and morbid obesity.  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  She takes care of activities of daily living.  She has been treated for GERD.  She mentions to me that when she exerts herself she has substernal chest tightness going to the neck into the arm and this is very concerning to her.  When she rests he feels better.   No syncope no orthopnea or PND.  This is going on for the past several weeks.  At the time of my evaluation, the patient is alert awake oriented and in no distress.  Patient denies any history of dyslipidemia or diabetes mellitus.  Past Medical History:  Diagnosis Date  . Anemia    iron def  . Anginal pain ([place])   . Anxiety   . Arthritis    left knee   . Back pain   . Benign gastrointestinal stromal tumor (GIST)   . Chronic knee pain   . Depression   . DVT (deep venous thrombosis) ([place])   . GERD (gastroesophageal reflux disease)   . Hx of laparoscopic gastric banding   . Hypertension   .  Kidney stones   . Migraine   . Morbid obesity ([place])   . PE (pulmonary embolism)     Past Surgical History:  Procedure Laterality Date  . [place] STUDY N/A 05/13/2016   Procedure: [place] STUDY;  Surgeon: Manus Gunning, MD;  Location: [facility];  Service: Gastroenterology;  Laterality: N/A;  . ABDOMINAL HYSTERECTOMY    . BIOPSY  02/16/2012   Procedure: BIOPSY;  Surgeon: [name], MD;  Location: [facility];  Service: General;;  biopsy of mass x 2  . BREATH TEK H PYLORI  07/28/2011   Procedure: [place];  Surgeon: [name], MD;  Location: [facility];  Service: General;  Laterality: N/A;  to be done at 745  . CESAREAN SECTION  05/1991  . COLONOSCOPY WITH PROPOFOL N/A 03/03/2016   Procedure: COLONOSCOPY WITH PROPOFOL;  Surgeon: Manus Gunning, MD;  Location: [facility];  Service: Gastroenterology;  Laterality: N/A;  . ESOPHAGEAL MANOMETRY N/A 05/13/2016   Procedure: ESOPHAGEAL MANOMETRY (EM);  Surgeon: Manus Gunning, MD;  Location: [facility];  Service: Gastroenterology;  Laterality: N/A;  . ESOPHAGOGASTRODUODENOSCOPY N/A 08/29/2012   Procedure: ESOPHAGOGASTRODUODENOSCOPY (EGD);  Surgeon: [name], MD;  Location: [facility];  Service: General;  Laterality: N/A;  . ESOPHAGOGASTRODUODENOSCOPY N/A 09/04/2014   Procedure: ESOPHAGOGASTRODUODENOSCOPY (EGD);  Surgeon: Alphonsa Overall, MD;  Location: [facility];  Service: General;  Laterality: N/A;  . ESOPHAGOGASTRODUODENOSCOPY (EGD) WITH PROPOFOL N/A 03/03/2016   Procedure: ESOPHAGOGASTRODUODENOSCOPY (EGD) WITH PROPOFOL;  Surgeon: Manus Gunning, MD;  Location: [facility];  Service: Gastroenterology;  Laterality: N/A;  . EUS  03/03/2012   Procedure: UPPER ENDOSCOPIC ULTRASOUND (EUS) LINEAR;  Surgeon: Milus Banister, MD;  Location: [facility];  Service: Endoscopy;  Laterality: N/A;  . gist    . KNEE ARTHROSCOPY    . LAPAROSCOPIC GASTRECTOMY  04/08/2012   Procedure: LAPAROSCOPIC GASTRECTOMY;  Surgeon: [name], MD;  Location: [facility];  Service: General;;  removal of GIST tumor of stomach  . LAPAROSCOPIC GASTRIC SLEEVE RESECTION N/A 08/29/2012   Procedure: LAPAROSCOPIC GASTRIC SLEEVE RESECTION;  Surgeon: [name], MD;  Location: [facility];  Service: General;  Laterality: N/A;  Sleeve Gastrectomy  . TOTAL KNEE ARTHROPLASTY Left 10/06/2013   Procedure: LEFT TOTAL KNEE ARTHROPLASTY;  Surgeon: Mcarthur Rossetti, MD;  Location: [facility];  Service: Orthopedics;  Laterality: Left;  . TUBAL LIGATION      Current  Medications: Current Meds  Medication Sig  . ALPRAZolam (XANAX) 1 MG tablet Take one by mouth twice daily as needed  . desipramine (NORPRAMIN) 10 MG tablet Take 1 tablet (10 mg total) by mouth at bedtime.  Marland Kitchen dexlansoprazole (DEXILANT) 60 MG capsule Take 1 capsule (60 mg total) by mouth daily.  . hydrOXYzine (VISTARIL) 25 MG capsule Take 1 capsule (25 mg total) by mouth at bedtime.  Marland Kitchen ibuprofen (ADVIL,MOTRIN) 600 MG tablet Take 1 tablet (600 mg total) by mouth every 6 (six) hours as needed.  . lamoTRIgine (LAMICTAL) 200 MG tablet Take 1 tablet (200 mg total) by mouth at bedtime.  Marland Kitchen lisinopril (PRINIVIL,ZESTRIL) 10 MG tablet Take 1 tablet (10 mg total) by mouth daily.  . methenamine (HIPREX) 1 g tablet Take 1 g 2 (two) times daily with a meal by mouth.   . mupirocin ointment (BACTROBAN) 2 % Apply t o affected skin area twice daily as directed  . nystatin cream (MYCOSTATIN) APPLY TOPICALLY 2 TIMES A DAY (Patient taking differently: Apply  1 application topically 2 (two) times daily as needed for dry skin. )  . ondansetron (ZOFRAN-ODT) 4 MG disintegrating tablet Take 4 mg by mouth every 8 (eight) hours as needed for nausea.   Marland Kitchen oxyCODONE-acetaminophen (PERCOCET) 10-325 MG tablet Take one by mouth three times a day as directed for chronic neck and leg pain 30 day supply do not filll before May 24 2017  . polyethylene glycol powder (GLYCOLAX/MIRALAX) powder Taper up to 1 or 2 scoops daily (Patient taking differently: Take 1 Container daily as needed by mouth for mild constipation or moderate constipation. Taper up to 1 or 2 scoops daily)  . risperiDONE (RISPERDAL) 1 MG tablet Take 1 tablet (1 mg total) by mouth at bedtime.  . rivaroxaban (XARELTO) 20 MG TABS tablet Take 1 tablet (20 mg total) by mouth daily with supper.  Marland Kitchen tiZANidine (ZANAFLEX) 4 MG tablet Take 1 tablet (4 mg total) by mouth 2 (two) times daily as needed for muscle spasms.  . VESICARE 5 MG tablet Take 5 mg daily by mouth.       Allergies:   Codeine   Social History   Socioeconomic History  . Marital status: Single    Spouse name: None  . Number of children: None  . Years of education: None  . Highest education level: None  Social Needs  . Financial resource strain: None  . Food insecurity - worry: None  . Food insecurity - inability: None  . Transportation needs - medical: None  . Transportation needs - non-medical: None  Occupational History  . None  Tobacco Use  . Smoking status: Never Smoker  . Smokeless tobacco: Never Used  Substance and Sexual Activity  . Alcohol use: No    Alcohol/week: 0.0 oz    Frequency: Never  . Drug use: No  . Sexual activity: None  Other Topics Concern  . None  Social History Narrative   Patient has been on disability since 2004 for knee pain, back pain, depression and acid reflux. She does work part-time now.       She is caring for her granddaughter Amia age 76 (has sickle cell, goes to [facility] every month and has had multiple hospitalizations).      Family History: The patient's family history includes Colitis in her father; Colon cancer in her father; Colon polyps in her father; Diabetes in her father, maternal aunt, maternal grandfather, maternal grandmother, maternal uncle, mother, paternal aunt, and paternal uncle; Heart disease in her father, paternal grandfather, paternal grandmother, and paternal uncle; Irritable bowel syndrome in her father; Schizophrenia in her maternal grandfather.  ROS:   Please see the history of present illness.    All other systems reviewed and are negative.  EKGs/Labs/Other Studies Reviewed:    The following studies were reviewed today: I reviewed EKG done today and it revealed sinus rhythm and nonspecific ST-T changes other blood work reports were also reviewed and her lipids are acceptable but can be better.   Recent Labs: 05/19/2017: ALT 7; BUN 8; Creatinine, Ser 0.65; Hemoglobin 12.2; Platelets 465; Potassium 4.6; Sodium 141   Recent Lipid Panel    Component Value Date/Time   CHOL 170 05/19/2017 0920   TRIG 82 05/19/2017 0920   HDL 51 05/19/2017 0920   CHOLHDL 3.3 05/19/2017 0920   CHOLHDL 3.0 10/17/2014 1020   VLDL 15 10/17/2014 1020   LDLCALC 103 (H) 05/19/2017 0920   LDLDIRECT 95 11/04/2011 0940    Physical Exam:    VS:  BP  124/72 (BP Location: Left Arm, Patient Position: Sitting, Cuff Size: Large)   Pulse 89   Ht 5\' 7"  (1.702 m)   Wt (!) 346 lb 1.3 oz (157 kg)   SpO2 99%   BMI 54.20 kg/m     Wt Readings from Last 3 Encounters:  05/25/17 (!) 346 lb 1.3 oz (157 kg)  05/20/17 (!) 347 lb 3.2 oz (157.5 kg)  05/19/17 (!) 345 lb 6.4 oz (156.7 kg)     GEN: Patient is in no acute distress HEENT: Normal NECK: No JVD; No carotid bruits LYMPHATICS: No lymphadenopathy CARDIAC: S1 S2 regular, 2/6 systolic murmur at the apex. RESPIRATORY:  Clear to auscultation without rales, wheezing or rhonchi  ABDOMEN: Soft, non-tender, non-distended MUSCULOSKELETAL:  No edema; No deformity  SKIN: Warm and dry NEUROLOGIC:  Alert and oriented x 3 PSYCHIATRIC:  Normal affect    Signed, Jenean Lindau, MD  05/25/2017 9:18 AM    [place]

## 2017-05-25 NOTE — Addendum Note (Signed)
Addended by: Mattie Marlin on: 05/25/2017 09:28 AM   Modules accepted: Orders

## 2017-05-25 NOTE — Patient Instructions (Addendum)
Medication Instructions: Your physician has recommended you make the following change in your medication:  START Nitroglycerin 0.4 mg sublingual (under your tongue) as needed for chest pain. If experiencing chest pain, stop what you are doing and sit down. Take 1 nitroglycerin and wait 5 minutes. If chest pain continues, take another nitroglycerin and wait 5 minutes. If chest pain does not subside, take 1 more nitroglycerin and dial 911. You make take a total of 3 nitroglycerin in a 15 minute time frame. START enteric coated 162 mg of baby aspirin daily  Labwork: Your physician recommends that you return for lab work in: INR  Testing/Procedures: A chest x-ray takes a picture of the organs and structures inside the chest, including the heart, lungs, and blood vessels. This test can show several things, including, whether the heart is enlarges; whether fluid is building up in the lungs; and whether pacemaker / defibrillator leads are still in place.    [facility] [address] [place] [address] Dept: [phone] Loc: [phone]  Crystal Garza  05/25/2017  You are scheduled for a Cardiac Catheterization on Friday, January 25 with Dr. Glenetta Garza.  1. Please arrive at the [facility] (Main Entrance A) at [facility]: [address] [place], [place] [address] at 8:00 AM (two hours before your procedure to ensure your preparation). Free valet parking service is available.   Special note: Every effort is made to have your procedure done on time. Please understand that emergencies sometimes delay scheduled procedures.  2. Diet: Do not eat or drink anything after midnight prior to your procedure except sips of water to take medications.  3. Labs: Done today and on the 16th.  4. Medication instructions in preparation for your procedure:  Stop taking Xarelto (Rivaroxaban) on  Wednesday, January 23.   On the morning of your procedure, take your Aspirin and any morning medicines NOT listed above.  You may use sips of water.  5. Plan for one night stay--bring personal belongings. 6. Bring a current list of your medications and current insurance cards. 7. You MUST have a responsible person to drive you home. 8. Someone MUST be with you the first 24 hours after you arrive home or your discharge will be delayed. 9. Please wear clothes that are easy to get on and off and wear slip-on shoes.  Thank you for allowing [name] to care for you!   -- [name]   Follow-Up: Your physician recommends that you schedule a follow-up appointment in: 2 weeks  Any Other Special Instructions Will Be Listed Below (If Applicable).     If you need a refill on your cardiac medications before your next appointment, please call your pharmacy.   [place], RN, BSN   Coronary Angiogram With Stent Coronary angiogram with stent placement is a procedure to widen or open a narrow blood vessel of the heart (coronary artery). Arteries may become blocked by cholesterol buildup (plaques) in the lining of the wall. When a coronary artery becomes partially blocked, blood flow to that area decreases. This may lead to chest pain or a heart attack (myocardial infarction). A stent is a small piece of metal that looks like mesh or a spring. Stent placement may be done as treatment for a heart attack or right after a coronary angiogram in which a blocked artery is found. Let your health care  provider know about:  Any allergies you have.  All medicines you are taking, including vitamins, herbs, eye drops, creams, and over-the-counter medicines.  Any problems you or family members have had with anesthetic medicines.  Any blood disorders you have.  Any surgeries you have had.  Any medical conditions you have.  Whether you are pregnant or may be  pregnant. What are the risks? Generally, this is a safe procedure. However, problems may occur, including:  Damage to the heart or its blood vessels.  A return of blockage.  Bleeding, infection, or bruising at the insertion site.  A collection of blood under the skin (hematoma) at the insertion site.  A blood clot in another part of the body.  Kidney injury.  Allergic reaction to the dye or contrast that is used.  Bleeding into the abdomen (retroperitoneal bleeding).  What happens before the procedure? Staying hydrated Follow instructions from your health care provider about hydration, which may include:  Up to 2 hours before the procedure - you may continue to drink clear liquids, such as water, clear fruit juice, black coffee, and plain tea.  Eating and drinking restrictions Follow instructions from your health care provider about eating and drinking, which may include:  8 hours before the procedure - stop eating heavy meals or foods such as meat, fried foods, or fatty foods.  6 hours before the procedure - stop eating light meals or foods, such as toast or cereal.  2 hours before the procedure - stop drinking clear liquids.  Ask your health care provider about:  Changing or stopping your regular medicines. This is especially important if you are taking diabetes medicines or blood thinners.  Taking medicines such as ibuprofen. These medicines can thin your blood. Do not take these medicines before your procedure if your health care provider instructs you not to. Generally, aspirin is recommended before a procedure of passing a small, thin tube (catheter) through a blood vessel and into the heart (cardiac catheterization).  What happens during the procedure?  An IV tube will be inserted into one of your veins.  You will be given one or more of the following: ? A medicine to help you relax (sedative). ? A medicine to numb the area where the catheter will be inserted  into an artery (local anesthetic).  To reduce your risk of infection: ? Your health care team will wash or sanitize their hands. ? Your skin will be washed with soap. ? Hair may be removed from the area where the catheter will be inserted.  Using a guide wire, the catheter will be inserted into an artery. The location may be in your groin, in your wrist, or in the fold of your arm (near your elbow).  A type of X-ray (fluoroscopy) will be used to help guide the catheter to the opening of the arteries in the heart.  A dye will be injected into the catheter, and X-rays will be taken. The dye will help to show where any narrowing or blockages are located in the arteries.  A tiny wire will be guided to the blocked spot, and a balloon will be inflated to make the artery wider.  The stent will be expanded and will crush the plaques into the wall of the vessel. The stent will hold the area open and improve the blood flow. Most stents have a drug coating to reduce the risk of the stent narrowing over time.  The artery may be made wider using a drill,  laser, or other tools to remove plaques.  When the blood flow is better, the catheter will be removed. The lining of the artery will grow over the stent, which stays where it was placed. This procedure may vary among health care providers and hospitals. What happens after the procedure?  If the procedure is done through the leg, you will be kept in bed lying flat for about 6 hours. You will be instructed to not bend and not cross your legs.  The insertion site will be checked frequently.  The pulse in your foot or wrist will be checked frequently.  You may have additional blood tests, X-rays, and a test that records the electrical activity of your heart (electrocardiogram, or ECG). This information is not intended to replace advice given to you by your health care provider. Make sure you discuss any questions you have with your health care  provider. Document Released: 10/25/2002 Document Revised: 12/19/2015 Document Reviewed: 11/24/2015 Elsevier Interactive Patient Education  2018 [employer].  Chest X-Ray A chest X-ray is a painless test that uses radiation to create images of the structures inside of your chest. Chest X-rays are used to look for many health conditions, including heart failure, pneumonia, tuberculosis, rib fractures, breathing disorders, and cancer. They may be used to diagnose chest pain, constant coughing, or trouble breathing. Tell a health care provider about:  Any allergies you have.  All medicines you are taking, including vitamins, herbs, eye drops, creams, and over-the-counter medicines.  Any surgeries you have had.  Any medical conditions you have.  Whether you are pregnant or may be pregnant. What are the risks? Getting a chest X-ray is a safe procedure. However, you will be exposed to a small amount of radiation. Being exposed to too much radiation over a lifetime can increase the risk of cancer. This risk is small, but it may occur if you have many X-rays throughout your life. What happens before the procedure?  You may be asked to remove glasses, jewelry, and any other metal objects.  You will be asked to undress from the waist up. You may be given a hospital gown to wear.  You may be asked to wear a protective lead apron to protect parts of your body from radiation. What happens during the procedure?  You will be asked to stand still as each picture is taken to get the best possible images.  You will be asked to take a deep breath and hold your breath for a few seconds.  The X-ray machine will create a picture of your chest using a tiny burst of radiation. This is painless.  More pictures may be taken from other angles. Typically, one picture will be taken while you face the X-ray camera, and another picture will be taken from the side while you stand. If you cannot stand, you may be  asked to lie down. The procedure may vary among health care providers and hospitals. What happens after the procedure?  The X-ray(s) will be reviewed by your health care provider or an X-ray (radiology) specialist.  It is up to you to get your test results. Ask your health care provider, or the department that is doing the test, when your results will be ready.  Your health care provider will tell you if you need more tests or a follow-up exam. Keep all follow-up visits as told by your health care provider. This is important. Summary  A chest X-ray is a safe, painless test that is used  to examine the inside of the chest, heart, and lungs.  You will need to undress from the waist up and remove jewelry and metal objects before the procedure.  You will be exposed to a small amount of radiation during the procedure.  The X-ray machine will take one or more pictures of your chest while you remain as still as possible.  Later, a health care provider or specialist will review the test results with you. This information is not intended to replace advice given to you by your health care provider. Make sure you discuss any questions you have with your health care provider. Document Released: 06/16/2016 Document Revised: 06/16/2016 Document Reviewed: 06/16/2016 Elsevier Interactive Patient Education  2018 [employer]. Nitroglycerin sublingual tablets What is this medicine? NITROGLYCERIN (nye troe GLI ser in) is a type of vasodilator. It relaxes blood vessels, increasing the blood and oxygen supply to your heart. This medicine is used to relieve chest pain caused by angina. It is also used to prevent chest pain before activities like climbing stairs, going outdoors in cold weather, or sexual activity. This medicine may be used for other purposes; ask your health care provider or pharmacist if you have questions. COMMON BRAND NAME(S): Nitroquick, Nitrostat, Nitrotab What should I tell my health care  provider before I take this medicine? They need to know if you have any of these conditions: -anemia -head injury, recent stroke, or bleeding in the brain -liver disease -previous heart attack -an unusual or allergic reaction to nitroglycerin, other medicines, foods, dyes, or preservatives -pregnant or trying to get pregnant -breast-feeding How should I use this medicine? Take this medicine by mouth as needed. At the first sign of an angina attack (chest pain or tightness) place one tablet under your tongue. You can also take this medicine 5 to 10 minutes before an event likely to produce chest pain. Follow the directions on the prescription label. Let the tablet dissolve under the tongue. Do not swallow whole. Replace the dose if you accidentally swallow it. It will help if your mouth is not dry. Saliva around the tablet will help it to dissolve more quickly. Do not eat or drink, smoke or chew tobacco while a tablet is dissolving. If you are not better within 5 minutes after taking ONE dose of nitroglycerin, call 9-1-1 immediately to seek emergency medical care. Do not take more than 3 nitroglycerin tablets over 15 minutes. If you take this medicine often to relieve symptoms of angina, your doctor or health care professional may provide you with different instructions to manage your symptoms. If symptoms do not go away after following these instructions, it is important to call 9-1-1 immediately. Do not take more than 3 nitroglycerin tablets over 15 minutes. Talk to your pediatrician regarding the use of this medicine in children. Special care may be needed. Overdosage: If you think you have taken too much of this medicine contact a poison control center or emergency room at once. NOTE: This medicine is only for you. Do not share this medicine with others. What if I miss a dose? This does not apply. This medicine is only used as needed. What may interact with this medicine? Do not take this  medicine with any of the following medications: -certain migraine medicines like ergotamine and dihydroergotamine (DHE) -medicines used to treat erectile dysfunction like sildenafil, tadalafil, and vardenafil -riociguat This medicine may also interact with the following medications: -alteplase -aspirin -heparin -medicines for high blood pressure -medicines for mental depression -other medicines  used to treat angina -phenothiazines like chlorpromazine, mesoridazine, prochlorperazine, thioridazine This list may not describe all possible interactions. Give your health care provider a list of all the medicines, herbs, non-prescription drugs, or dietary supplements you use. Also tell them if you smoke, drink alcohol, or use illegal drugs. Some items may interact with your medicine. What should I watch for while using this medicine? Tell your doctor or health care professional if you feel your medicine is no longer working. Keep this medicine with you at all times. Sit or lie down when you take your medicine to prevent falling if you feel dizzy or faint after using it. Try to remain calm. This will help you to feel better faster. If you feel dizzy, take several deep breaths and lie down with your feet propped up, or bend forward with your head resting between your knees. You may get drowsy or dizzy. Do not drive, use machinery, or do anything that needs mental alertness until you know how this drug affects you. Do not stand or sit up quickly, especially if you are an older patient. This reduces the risk of dizzy or fainting spells. Alcohol can make you more drowsy and dizzy. Avoid alcoholic drinks. Do not treat yourself for coughs, colds, or pain while you are taking this medicine without asking your doctor or health care professional for advice. Some ingredients may increase your blood pressure. What side effects may I notice from receiving this medicine? Side effects that you should report to your  doctor or health care professional as soon as possible: -blurred vision -dry mouth -skin rash -sweating -the feeling of extreme pressure in the head -unusually weak or tired Side effects that usually do not require medical attention (report to your doctor or health care professional if they continue or are bothersome): -flushing of the face or neck -headache -irregular heartbeat, palpitations -nausea, vomiting This list may not describe all possible side effects. Call your doctor for medical advice about side effects. You may report side effects to FDA at 1-800-FDA-1088. Where should I keep my medicine? Keep out of the reach of children. Store at room temperature between 20 and 25 degrees C (68 and 77 degrees F). Store in [employer]. Protect from light and moisture. Keep tightly closed. Throw away any unused medicine after the expiration date. NOTE: This sheet is a summary. It may not cover all possible information. If you have questions about this medicine, talk to your doctor, pharmacist, or health care provider.  2018 Elsevier/Gold Standard (2013-02-16 17:57:36)

## 2017-05-25 NOTE — Progress Notes (Signed)
Cardiology Office Note:    Date:  05/25/2017   ID:  Crystal Garza, DOB [birth year], MRN [id number]  PCP:  Dickie La, MD  Cardiologist:  Jenean Lindau, MD   Referring MD: Dickie La, MD    ASSESSMENT:    1. Angina pectoris ([place])   2. HYPERTENSION, BENIGN SYSTEMIC   3. Morbid obesity-BMI 62   4. History of pulmonary embolism    PLAN:    In order of problems listed above:  1. Primary prevention stressed with the patient.  Importance of compliance with diet and medications stressed and she vocalized understanding.  Risks of obesity explained in diet was discussed and she plans to follow this religiously and meticulously and lose weight. 2. Sublingual nitroglycerin prescription was sent, its protocol and 911 protocol explained and the patient vocalized understanding questions were answered to the patient's satisfaction 3. In view of the patient's symptoms, I discussed with the patient options for evaluation. Invasive and noninvasive options were given to the patient. I discussed stress testing and coronary angiography and left heart catheterization at length. Benefits, pros and cons of each approach were discussed at length. Patient had multiple questions which were answered to the patient's satisfaction. Patient opted for invasive evaluation and we will set up for coronary angiography and left heart catheterization. Further recommendations will be made based on the findings with coronary angiography. In the interim if the patient has any significant symptoms in hospital to the nearest emergency room. 4. She has been advised to stop Xarelto the night previous to the procedure.  If the interventional cardiologist, is any other choice or prefers a longer period of time and I would leave it up to him.    Medication Adjustments/Labs and Tests Ordered: Current medicines are reviewed at length with the patient today.  Concerns regarding medicines are outlined above.  No orders of the  defined types were placed in this encounter.  No orders of the defined types were placed in this encounter.    History of Present Illness:    Crystal Garza is a 47 y.o. female who is being seen today for the evaluation of chest pain on exertion at the request of Dickie La, MD.  Patient is a pleasant 47 year old female.  She has past medical history of essential hypertension, DVT in the lower extremity after knee replacement in 2015 and morbid obesity.  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  She takes care of activities of daily living.  She has been treated for GERD.  She mentions to me that when she exerts herself she has substernal chest tightness going to the neck into the arm and this is very concerning to her.  When she rests he feels better.   No syncope no orthopnea or PND.  This is going on for the past several weeks.  At the time of my evaluation, the patient is alert awake oriented and in no distress.  Patient denies any history of dyslipidemia or diabetes mellitus.  Past Medical History:  Diagnosis Date  . Anemia    iron def  . Anginal pain ([place])   . Anxiety   . Arthritis    left knee   . Back pain   . Benign gastrointestinal stromal tumor (GIST)   . Chronic knee pain   . Depression   . DVT (deep venous thrombosis) ([place])   . GERD (gastroesophageal reflux disease)   . Hx of laparoscopic gastric banding   . Hypertension   .  Kidney stones   . Migraine   . Morbid obesity ([place])   . PE (pulmonary embolism)     Past Surgical History:  Procedure Laterality Date  . [place] STUDY N/A 05/13/2016   Procedure: [name] STUDY;  Surgeon: Manus Gunning, MD;  Location: [facility];  Service: Gastroenterology;  Laterality: N/A;  . ABDOMINAL HYSTERECTOMY    . BIOPSY  02/16/2012   Procedure: BIOPSY;  Surgeon: [name], MD;  Location: [facility];  Service: General;;  biopsy of mass x 2  . BREATH TEK H PYLORI  07/28/2011   Procedure: [place];  Surgeon: [name], MD;  Location: [facility];  Service: General;  Laterality: N/A;  to be done at 745  . CESAREAN SECTION  05/1991  . COLONOSCOPY WITH PROPOFOL N/A 03/03/2016   Procedure: COLONOSCOPY WITH PROPOFOL;  Surgeon: Manus Gunning, MD;  Location: [facility];  Service: Gastroenterology;  Laterality: N/A;  . ESOPHAGEAL MANOMETRY N/A 05/13/2016   Procedure: ESOPHAGEAL MANOMETRY (EM);  Surgeon: Manus Gunning, MD;  Location: [facility];  Service: Gastroenterology;  Laterality: N/A;  . ESOPHAGOGASTRODUODENOSCOPY N/A 08/29/2012   Procedure: ESOPHAGOGASTRODUODENOSCOPY (EGD);  Surgeon: [name], MD;  Location: [facility];  Service: General;  Laterality: N/A;  . ESOPHAGOGASTRODUODENOSCOPY N/A 09/04/2014   Procedure: ESOPHAGOGASTRODUODENOSCOPY (EGD);  Surgeon: Alphonsa Overall, MD;  Location: [facility];  Service: General;  Laterality: N/A;  . ESOPHAGOGASTRODUODENOSCOPY (EGD) WITH PROPOFOL N/A 03/03/2016   Procedure: ESOPHAGOGASTRODUODENOSCOPY (EGD) WITH PROPOFOL;  Surgeon: Manus Gunning, MD;  [facility];  Service: Gastroenterology;  Laterality: N/A;  . EUS  03/03/2012   Procedure: UPPER ENDOSCOPIC ULTRASOUND (EUS) LINEAR;  Surgeon: Milus Banister, MD;  Location: [facility];  Service: Endoscopy;  Laterality: N/A;  . gist    . KNEE ARTHROSCOPY    . LAPAROSCOPIC GASTRECTOMY  04/08/2012   Procedure: LAPAROSCOPIC GASTRECTOMY;  Surgeon: [name], MD;  Location: [facility];  Service: General;;  removal of GIST tumor of stomach  . LAPAROSCOPIC GASTRIC SLEEVE RESECTION N/A 08/29/2012   Procedure: LAPAROSCOPIC GASTRIC SLEEVE RESECTION;  Surgeon: [name], MD;  Location: [facility];  Service: General;  Laterality: N/A;  Sleeve Gastrectomy  . TOTAL KNEE ARTHROPLASTY Left 10/06/2013   Procedure: LEFT TOTAL KNEE ARTHROPLASTY;  Surgeon: Mcarthur Rossetti, MD;  Location: [facility];  Service: Orthopedics;  Laterality: Left;  . TUBAL LIGATION      Current  Medications: Current Meds  Medication Sig  . ALPRAZolam (XANAX) 1 MG tablet Take one by mouth twice daily as needed  . desipramine (NORPRAMIN) 10 MG tablet Take 1 tablet (10 mg total) by mouth at bedtime.  Marland Kitchen dexlansoprazole (DEXILANT) 60 MG capsule Take 1 capsule (60 mg total) by mouth daily.  . hydrOXYzine (VISTARIL) 25 MG capsule Take 1 capsule (25 mg total) by mouth at bedtime.  Marland Kitchen ibuprofen (ADVIL,MOTRIN) 600 MG tablet Take 1 tablet (600 mg total) by mouth every 6 (six) hours as needed.  . lamoTRIgine (LAMICTAL) 200 MG tablet Take 1 tablet (200 mg total) by mouth at bedtime.  Marland Kitchen lisinopril (PRINIVIL,ZESTRIL) 10 MG tablet Take 1 tablet (10 mg total) by mouth daily.  . methenamine (HIPREX) 1 g tablet Take 1 g 2 (two) times daily with a meal by mouth.   . mupirocin ointment (BACTROBAN) 2 % Apply t o affected skin area twice daily as directed  . nystatin cream (MYCOSTATIN) APPLY TOPICALLY 2 TIMES A DAY (Patient taking differently: Apply  1 application topically 2 (two) times daily as needed for dry skin. )  . ondansetron (ZOFRAN-ODT) 4 MG disintegrating tablet Take 4 mg by mouth every 8 (eight) hours as needed for nausea.   Marland Kitchen oxyCODONE-acetaminophen (PERCOCET) 10-325 MG tablet Take one by mouth three times a day as directed for chronic neck and leg pain 30 day supply do not filll before May 24 2017  . polyethylene glycol powder (GLYCOLAX/MIRALAX) powder Taper up to 1 or 2 scoops daily (Patient taking differently: Take 1 Container daily as needed by mouth for mild constipation or moderate constipation. Taper up to 1 or 2 scoops daily)  . risperiDONE (RISPERDAL) 1 MG tablet Take 1 tablet (1 mg total) by mouth at bedtime.  . rivaroxaban (XARELTO) 20 MG TABS tablet Take 1 tablet (20 mg total) by mouth daily with supper.  Marland Kitchen tiZANidine (ZANAFLEX) 4 MG tablet Take 1 tablet (4 mg total) by mouth 2 (two) times daily as needed for muscle spasms.  . VESICARE 5 MG tablet Take 5 mg daily by mouth.       Allergies:   Codeine   Social History   Socioeconomic History  . Marital status: Single    Spouse name: None  . Number of children: None  . Years of education: None  . Highest education level: None  Social Needs  . Financial resource strain: None  . Food insecurity - worry: None  . Food insecurity - inability: None  . Transportation needs - medical: None  . Transportation needs - non-medical: None  Occupational History  . None  Tobacco Use  . Smoking status: Never Smoker  . Smokeless tobacco: Never Used  Substance and Sexual Activity  . Alcohol use: No    Alcohol/week: 0.0 oz    Frequency: Never  . Drug use: No  . Sexual activity: None  Other Topics Concern  . None  Social History Narrative   Patient has been on disability since 2004 for knee pain, back pain, depression and acid reflux. She does work part-time now.       She is caring for her granddaughter Amia age 23 (has sickle cell, goes to [facility] every month and has had multiple hospitalizations).      Family History: The patient's family history includes Colitis in her father; Colon cancer in her father; Colon polyps in her father; Diabetes in her father, maternal aunt, maternal grandfather, maternal grandmother, maternal uncle, mother, paternal aunt, and paternal uncle; Heart disease in her father, paternal grandfather, paternal grandmother, and paternal uncle; Irritable bowel syndrome in her father; Schizophrenia in her maternal grandfather.  ROS:   Please see the history of present illness.    All other systems reviewed and are negative.  EKGs/Labs/Other Studies Reviewed:    The following studies were reviewed today: I reviewed EKG done today and it revealed sinus rhythm and nonspecific ST-T changes other blood work reports were also reviewed and her lipids are acceptable but can be better.   Recent Labs: 05/19/2017: ALT 7; BUN 8; Creatinine, Ser 0.65; Hemoglobin 12.2; Platelets 465; Potassium 4.6; Sodium 141   Recent Lipid Panel    Component Value Date/Time   CHOL 170 05/19/2017 0920   TRIG 82 05/19/2017 0920   HDL 51 05/19/2017 0920   CHOLHDL 3.3 05/19/2017 0920   CHOLHDL 3.0 10/17/2014 1020   VLDL 15 10/17/2014 1020   LDLCALC 103 (H) 05/19/2017 0920   LDLDIRECT 95 11/04/2011 0940    Physical Exam:    VS:  BP  124/72 (BP Location: Left Arm, Patient Position: Sitting, Cuff Size: Large)   Pulse 89   Ht 5\' 7"  (1.702 m)   Wt (!) 346 lb 1.3 oz (157 kg)   SpO2 99%   BMI 54.20 kg/m     Wt Readings from Last 3 Encounters:  05/25/17 (!) 346 lb 1.3 oz (157 kg)  05/20/17 (!) 347 lb 3.2 oz (157.5 kg)  05/19/17 (!) 345 lb 6.4 oz (156.7 kg)     GEN: Patient is in no acute distress HEENT: Normal NECK: No JVD; No carotid bruits LYMPHATICS: No lymphadenopathy CARDIAC: S1 S2 regular, 2/6 systolic murmur at the apex. RESPIRATORY:  Clear to auscultation without rales, wheezing or rhonchi  ABDOMEN: Soft, non-tender, non-distended MUSCULOSKELETAL:  No edema; No deformity  SKIN: Warm and dry NEUROLOGIC:  Alert and oriented x 3 PSYCHIATRIC:  Normal affect    Signed, Jenean Lindau, MD  05/25/2017 9:18 AM    [place]

## 2017-05-25 NOTE — Telephone Encounter (Signed)
Confirmed with pt that she did get her medication. Crystal Garza, April D, [place]

## 2017-05-26 ENCOUNTER — Telehealth: Payer: Self-pay | Admitting: *Deleted

## 2017-05-26 LAB — PROTIME-INR
INR: 0.9 (ref 0.8–1.2)
Prothrombin Time: 9.8 s (ref 9.1–12.0)

## 2017-05-26 NOTE — Addendum Note (Signed)
Addended by: Mattie Marlin on: 05/26/2017 09:27 AM   Modules accepted: Orders

## 2017-05-26 NOTE — Telephone Encounter (Signed)
Patient contacted pre-catheterization at [facility] scheduled for: [facility] 05/28/17 Verified arrival time and place: [facility] NT/Main A--8 AM/10:30 AM  Confirmed AM meds to be taken pre-cath with sip of water:  Take Aspirin 81 mg AM of Hold Xarelto starting 05/26/17  Confirmed patient has responsible person to drive home post procedure and observe patient for 24 hours: yes

## 2017-05-28 ENCOUNTER — Ambulatory Visit (HOSPITAL_COMMUNITY)
Admission: RE | Admit: 2017-05-28 | Discharge: 2017-05-28 | Disposition: A | Discharge status: Home / Self Care | Payer: Medicare Other | Source: Ambulatory Visit | Attending: Cardiology | Admitting: Cardiology

## 2017-05-28 ENCOUNTER — Encounter (HOSPITAL_COMMUNITY)
Admission: RE | Disposition: A | Discharge status: Home / Self Care | Payer: Self-pay | Source: Ambulatory Visit | Attending: Cardiology

## 2017-05-28 DIAGNOSIS — Z86711 Personal history of pulmonary embolism: Secondary | ICD-10-CM | POA: Diagnosis not present

## 2017-05-28 DIAGNOSIS — F419 Anxiety disorder, unspecified: Secondary | ICD-10-CM | POA: Insufficient documentation

## 2017-05-28 DIAGNOSIS — I10 Essential (primary) hypertension: Secondary | ICD-10-CM | POA: Insufficient documentation

## 2017-05-28 DIAGNOSIS — K219 Gastro-esophageal reflux disease without esophagitis: Secondary | ICD-10-CM | POA: Diagnosis not present

## 2017-05-28 DIAGNOSIS — Z86718 Personal history of other venous thrombosis and embolism: Secondary | ICD-10-CM | POA: Insufficient documentation

## 2017-05-28 DIAGNOSIS — M549 Dorsalgia, unspecified: Secondary | ICD-10-CM | POA: Diagnosis not present

## 2017-05-28 DIAGNOSIS — Z7901 Long term (current) use of anticoagulants: Secondary | ICD-10-CM | POA: Insufficient documentation

## 2017-05-28 DIAGNOSIS — Z96652 Presence of left artificial knee joint: Secondary | ICD-10-CM | POA: Insufficient documentation

## 2017-05-28 DIAGNOSIS — Z79899 Other long term (current) drug therapy: Secondary | ICD-10-CM | POA: Diagnosis not present

## 2017-05-28 DIAGNOSIS — I25119 Atherosclerotic heart disease of native coronary artery with unspecified angina pectoris: Secondary | ICD-10-CM | POA: Insufficient documentation

## 2017-05-28 DIAGNOSIS — F329 Major depressive disorder, single episode, unspecified: Secondary | ICD-10-CM | POA: Insufficient documentation

## 2017-05-28 DIAGNOSIS — Z6841 Body Mass Index (BMI) 40.0 and over, adult: Secondary | ICD-10-CM | POA: Insufficient documentation

## 2017-05-28 DIAGNOSIS — I209 Angina pectoris, unspecified: Secondary | ICD-10-CM | POA: Diagnosis not present

## 2017-05-28 HISTORY — PX: LEFT HEART CATH AND CORONARY ANGIOGRAPHY: CATH118249

## 2017-05-28 SURGERY — LEFT HEART CATH AND CORONARY ANGIOGRAPHY
Anesthesia: LOCAL

## 2017-05-28 MED ORDER — ONDANSETRON HCL 4 MG/2ML IJ SOLN: 4.0000 mg | Freq: Four times a day (QID) | INTRAMUSCULAR | Status: DC | PRN

## 2017-05-28 MED ORDER — SODIUM CHLORIDE 0.9 % IV SOLN: INTRAVENOUS | Status: DC

## 2017-05-28 MED ORDER — IOPAMIDOL (ISOVUE-370) INJECTION 76%
INTRAVENOUS | Status: AC
  Filled 2017-05-28: qty 100

## 2017-05-28 MED ORDER — HEPARIN SODIUM (PORCINE) 1000 UNIT/ML IJ SOLN
INTRAMUSCULAR | Status: AC
  Filled 2017-05-28: qty 1

## 2017-05-28 MED ORDER — ASPIRIN 81 MG PO CHEW: 81.0000 mg | CHEWABLE_TABLET | ORAL | Status: DC

## 2017-05-28 MED ORDER — LIDOCAINE HCL (PF) 1 % IJ SOLN
INTRAMUSCULAR | Status: AC
  Filled 2017-05-28: qty 30

## 2017-05-28 MED ORDER — FENTANYL CITRATE (PF) 100 MCG/2ML IJ SOLN
INTRAMUSCULAR | Status: AC
  Filled 2017-05-28: qty 2

## 2017-05-28 MED ORDER — LIDOCAINE HCL (PF) 1 % IJ SOLN
INTRAMUSCULAR | Status: DC | PRN
  Administered 2017-05-28: 2 mL

## 2017-05-28 MED ORDER — MIDAZOLAM HCL 2 MG/2ML IJ SOLN
INTRAMUSCULAR | Status: DC | PRN
  Administered 2017-05-28 (×2): 0.5 mg via INTRAVENOUS

## 2017-05-28 MED ORDER — HEPARIN (PORCINE) IN NACL 2-0.9 UNIT/ML-% IJ SOLN
INTRAMUSCULAR | Status: AC | PRN
  Administered 2017-05-28: 1000 mL

## 2017-05-28 MED ORDER — SODIUM CHLORIDE 0.9 % WEIGHT BASED INFUSION: 1.0000 mL/kg/h | INTRAVENOUS | Status: DC

## 2017-05-28 MED ORDER — SODIUM CHLORIDE 0.9% FLUSH: 3.0000 mL | Freq: Two times a day (BID) | INTRAVENOUS | Status: DC

## 2017-05-28 MED ORDER — ACETAMINOPHEN 325 MG PO TABS: 650.0000 mg | ORAL_TABLET | ORAL | Status: DC | PRN

## 2017-05-28 MED ORDER — HEPARIN SODIUM (PORCINE) 1000 UNIT/ML IJ SOLN
INTRAMUSCULAR | Status: DC | PRN
  Administered 2017-05-28: 7000 [IU] via INTRAVENOUS

## 2017-05-28 MED ORDER — IOPAMIDOL (ISOVUE-370) INJECTION 76%
INTRAVENOUS | Status: DC | PRN
Start: 2017-05-28 — End: 2017-05-28
  Administered 2017-05-28: 80 mL via INTRA_ARTERIAL

## 2017-05-28 MED ORDER — MIDAZOLAM HCL 2 MG/2ML IJ SOLN
INTRAMUSCULAR | Status: AC
  Filled 2017-05-28: qty 2

## 2017-05-28 MED ORDER — SODIUM CHLORIDE 0.9 % WEIGHT BASED INFUSION
3.0000 mL/kg/h | INTRAVENOUS | Status: AC
  Administered 2017-05-28: 3 mL/kg/h via INTRAVENOUS

## 2017-05-28 MED ORDER — SODIUM CHLORIDE 0.9 % IV SOLN: 250.0000 mL | INTRAVENOUS | Status: DC | PRN

## 2017-05-28 MED ORDER — VERAPAMIL HCL 2.5 MG/ML IV SOLN
INTRAVENOUS | Status: AC
  Filled 2017-05-28: qty 2

## 2017-05-28 MED ORDER — VERAPAMIL HCL 2.5 MG/ML IV SOLN
INTRAVENOUS | Status: DC | PRN
  Administered 2017-05-28: 10 mL via INTRA_ARTERIAL

## 2017-05-28 MED ORDER — SODIUM CHLORIDE 0.9% FLUSH: 3.0000 mL | INTRAVENOUS | Status: DC | PRN

## 2017-05-28 MED ORDER — FENTANYL CITRATE (PF) 100 MCG/2ML IJ SOLN
INTRAMUSCULAR | Status: DC | PRN
  Administered 2017-05-28 (×2): 12.5 ug via INTRAVENOUS

## 2017-05-28 MED ORDER — HEPARIN (PORCINE) IN NACL 2-0.9 UNIT/ML-% IJ SOLN
INTRAMUSCULAR | Status: AC
  Filled 2017-05-28: qty 1000

## 2017-05-28 SURGICAL SUPPLY — 15 items
CATH INFINITI 5FR ANG PIGTAIL (CATHETERS) ×2 IMPLANT
CATH INFINITI JR4 5F (CATHETERS) ×2 IMPLANT
CATH OPTITORQUE TIG 4.0 5F (CATHETERS) ×2 IMPLANT
COVER PRB 48X5XTLSCP FOLD TPE (BAG) ×1 IMPLANT
COVER PROBE 5X48 (BAG) ×2
DEVICE RAD COMP TR BAND LRG (VASCULAR PRODUCTS) ×2 IMPLANT
GLIDESHEATH SLEND A-KIT 6F 22G (SHEATH) ×2 IMPLANT
GUIDEWIRE INQWIRE 1.5J.035X260 (WIRE) ×1 IMPLANT
HOVERMATT SINGLE USE (MISCELLANEOUS) ×2 IMPLANT
INQWIRE 1.5J .035X260CM (WIRE) ×2
KIT HEART LEFT (KITS) ×2 IMPLANT
PACK CARDIAC CATHETERIZATION (CUSTOM PROCEDURE TRAY) ×2 IMPLANT
SYR MEDRAD MARK V 150ML (SYRINGE) ×2 IMPLANT
TRANSDUCER W/STOPCOCK (MISCELLANEOUS) ×2 IMPLANT
TUBING CIL FLEX 10 FLL-RA (TUBING) ×2 IMPLANT

## 2017-05-28 NOTE — Discharge Instructions (Signed)

## 2017-05-28 NOTE — Interval H&P Note (Signed)
History and Physical Interval Note:  05/28/2017 11:11 AM  Crystal Garza  has presented today for surgery, with the diagnosis of (Class III) angina  The various methods of treatment have been discussed with the patient and family. After consideration of risks, benefits and other options for treatment, the patient has consented to  Procedure(s): LEFT HEART CATH AND CORONARY ANGIOGRAPHY (N/A) with possible PERCUTANEOUS CORONARY INTERVENTION as a surgical intervention .  The patient's history has been reviewed, patient examined, no change in status, stable for surgery.  I have reviewed the patient's chart and labs.  Questions were answered to the patient's satisfaction.     Cath Lab Visit (complete for each Cath Lab visit)  Clinical Evaluation Leading to the Procedure:   ACS: No.  Non-ACS:    Anginal Classification: CCS III  Anti-ischemic medical therapy: Minimal Therapy (1 class of medications)  Non-Invasive Test Results: No non-invasive testing performed  Prior CABG: No previous CABG   Glenetta Hew

## 2017-05-28 NOTE — Research (Signed)
CADFEM Informed Consent   Subject Name: Crystal Garza  Subject met inclusion and exclusion criteria.  The informed consent form, study requirements and expectations were reviewed with the subject and questions and concerns were addressed prior to the signing of the consent form.  The subject verbalized understanding of the trail requirements.  The subject agreed to participate in the CADFEM trial and signed the informed consent.  The informed consent was obtained prior to performance of any protocol-specific procedures for the subject.  A copy of the signed informed consent was given to the subject and a copy was placed in the subject's medical record.  [name] 05/28/2017, 08:34 AM

## 2017-05-30 ENCOUNTER — Encounter (HOSPITAL_COMMUNITY): Payer: Self-pay | Admitting: Cardiology

## 2017-05-31 DIAGNOSIS — G894 Chronic pain syndrome: Secondary | ICD-10-CM | POA: Diagnosis not present

## 2017-05-31 DIAGNOSIS — M2550 Pain in unspecified joint: Secondary | ICD-10-CM | POA: Diagnosis not present

## 2017-05-31 DIAGNOSIS — G8929 Other chronic pain: Secondary | ICD-10-CM | POA: Diagnosis not present

## 2017-05-31 DIAGNOSIS — M549 Dorsalgia, unspecified: Secondary | ICD-10-CM | POA: Diagnosis not present

## 2017-05-31 DIAGNOSIS — M542 Cervicalgia: Secondary | ICD-10-CM | POA: Diagnosis not present

## 2017-06-01 ENCOUNTER — Other Ambulatory Visit: Payer: Self-pay

## 2017-06-01 ENCOUNTER — Ambulatory Visit (HOSPITAL_BASED_OUTPATIENT_CLINIC_OR_DEPARTMENT_OTHER)
Admission: RE | Admit: 2017-06-01 | Discharge: 2017-06-01 | Disposition: A | Discharge status: Home / Self Care | Payer: Medicare Other | Source: Ambulatory Visit | Attending: Family Medicine | Admitting: Family Medicine

## 2017-06-01 ENCOUNTER — Encounter: Payer: Self-pay | Admitting: Family Medicine

## 2017-06-01 DIAGNOSIS — Z1231 Encounter for screening mammogram for malignant neoplasm of breast: Secondary | ICD-10-CM

## 2017-06-01 MED ORDER — AMOXICILLIN-POT CLAVULANATE 875-125 MG PO TABS: 1.0000 | ORAL_TABLET | Freq: Two times a day (BID) | ORAL | 0 refills | Status: DC

## 2017-06-01 NOTE — Progress Notes (Signed)
Patient presented to office complaining of right arm discomfort post heart cath on 01/25. Pulse to right radial sight was present and strong. No swelling, redness or hematoma was observed. The patient experienced tenderness to the right forearm when palpated. Per Dr. Geraldo Pitter the patient is to be started on antibiotic  Therapy (augmentin 875 mg BID) for one week as a precaution. Patient was agreeable to this plan and will use warm compress and motrin prn. Patient was instructed to call the office on 01/31 to provide an update on the status of her arm.

## 2017-06-03 ENCOUNTER — Encounter (HOSPITAL_BASED_OUTPATIENT_CLINIC_OR_DEPARTMENT_OTHER): Payer: Self-pay

## 2017-06-03 ENCOUNTER — Ambulatory Visit (HOSPITAL_BASED_OUTPATIENT_CLINIC_OR_DEPARTMENT_OTHER)
Admission: RE | Admit: 2017-06-03 | Discharge: 2017-06-03 | Disposition: A | Discharge status: Home / Self Care | Payer: Medicare Other | Source: Ambulatory Visit | Attending: Cardiology | Admitting: Cardiology

## 2017-06-03 ENCOUNTER — Other Ambulatory Visit: Payer: Self-pay

## 2017-06-03 ENCOUNTER — Other Ambulatory Visit: Payer: Self-pay | Admitting: Cardiology

## 2017-06-03 DIAGNOSIS — M79601 Pain in right arm: Secondary | ICD-10-CM

## 2017-06-03 DIAGNOSIS — M79631 Pain in right forearm: Secondary | ICD-10-CM | POA: Diagnosis not present

## 2017-06-03 DIAGNOSIS — Z9889 Other specified postprocedural states: Secondary | ICD-10-CM | POA: Diagnosis not present

## 2017-06-04 ENCOUNTER — Other Ambulatory Visit: Payer: Self-pay

## 2017-06-08 ENCOUNTER — Ambulatory Visit (INDEPENDENT_AMBULATORY_CARE_PROVIDER_SITE_OTHER): Payer: Medicare Other | Admitting: Cardiology

## 2017-06-08 ENCOUNTER — Encounter: Payer: Self-pay | Admitting: Cardiology

## 2017-06-08 VITALS — BP 128/70 | HR 107 | Ht 67.0 in | Wt 347.0 lb

## 2017-06-08 DIAGNOSIS — I209 Angina pectoris, unspecified: Secondary | ICD-10-CM

## 2017-06-08 DIAGNOSIS — I10 Essential (primary) hypertension: Secondary | ICD-10-CM | POA: Diagnosis not present

## 2017-06-08 MED ORDER — DILTIAZEM HCL ER COATED BEADS 120 MG PO CP24: 120.0000 mg | ORAL_CAPSULE | Freq: Every day | ORAL | 1 refills | Status: DC

## 2017-06-08 NOTE — Patient Instructions (Addendum)
Medication Instructions:  Your physician has recommended you make the following change in your medication:  START Cardizem 120 mg daily  Labwork: None  Testing/Procedures: None  Follow-Up: Your physician recommends that you schedule a follow-up appointment in: 3 months  Any Other Special Instructions Will Be Listed Below (If Applicable).  Please log your pulse and blood pressure for one week and call this into the office   If you need a refill on your cardiac medications before your next appointment, please call your pharmacy.   [place], RN, BSN  Diltiazem extended-release capsules or tablets What is this medicine? DILTIAZEM (dil TYE a zem) is a calcium-channel blocker. It affects the amount of calcium found in your heart and muscle cells. This relaxes your blood vessels, which can reduce the amount of work the heart has to do. This medicine is used to treat high blood pressure and chest pain caused by angina. This medicine may be used for other purposes; ask your health care provider or pharmacist if you have questions. COMMON BRAND NAME(S): Cardizem CD, Cardizem LA, Cardizem SR, Cartia XT, Dilacor XR, Dilt-CD, Diltia XT, Diltzac, Matzim LA, Rema Fendt, Tiamate, Tiazac What should I tell my health care provider before I take this medicine? They need to know if you have any of these conditions: -heart problems, low blood pressure, irregular heartbeat -liver disease -previous heart attack -an unusual or allergic reaction to diltiazem, other medicines, foods, dyes, or preservatives -pregnant or trying to get pregnant -breast-feeding How should I use this medicine? Take this medicine by mouth with a glass of water. Follow the directions on the prescription label. Swallow whole, do not crush or chew. Ask your doctor or pharmacist if your should take this medicine with food. Take your doses at regular intervals. Do not take your medicine more often then directed. Do  not stop taking except on the advice of your doctor or health care professional. Ask your doctor or health care professional how to gradually reduce the dose. Talk to your pediatrician regarding the use of this medicine in children. Special care may be needed. Overdosage: If you think you have taken too much of this medicine contact a poison control center or emergency room at once. NOTE: This medicine is only for you. Do not share this medicine with others. What if I miss a dose? If you miss a dose, take it as soon as you can. If it is almost time for your next dose, take only that dose. Do not take double or extra doses. What may interact with this medicine? Do not take this medicine with any of the following medications: -cisapride -hawthorn -pimozide -ranolazine -red yeast rice This medicine may also interact with the following medications: -buspirone -carbamazepine -cimetidine -cyclosporine -digoxin -local anesthetics or general anesthetics -lovastatin -medicines for anxiety or difficulty sleeping like midazolam and triazolam -medicines for high blood pressure or heart problems -quinidine -rifampin, rifabutin, or rifapentine This list may not describe all possible interactions. Give your health care provider a list of all the medicines, herbs, non-prescription drugs, or dietary supplements you use. Also tell them if you smoke, drink alcohol, or use illegal drugs. Some items may interact with your medicine. What should I watch for while using this medicine? Check your blood pressure and pulse rate regularly. Ask your doctor or health care professional what your blood pressure and pulse rate should be and when you should contact him or her. You may feel dizzy or lightheaded. Do not  drive, use machinery, or do anything that needs mental alertness until you know how this medicine affects you. To reduce the risk of dizzy or fainting spells, do not sit or stand up quickly, especially if  you are an older patient. Alcohol can make you more dizzy or increase flushing and rapid heartbeats. Avoid alcoholic drinks. What side effects may I notice from receiving this medicine? Side effects that you should report to your doctor or health care professional as soon as possible: -allergic reactions like skin rash, itching or hives, swelling of the face, lips, or tongue -confusion, mental depression -feeling faint or lightheaded, falls -redness, blistering, peeling or loosening of the skin, including inside the mouth -slow, irregular heartbeat -swelling of the feet and ankles -unusual bleeding or bruising, pinpoint red spots on the skin Side effects that usually do not require medical attention (report to your doctor or health care professional if they continue or are bothersome): -constipation or diarrhea -difficulty sleeping -facial flushing -headache -nausea, vomiting -sexual dysfunction -weak or tired This list may not describe all possible side effects. Call your doctor for medical advice about side effects. You may report side effects to FDA at 1-800-FDA-1088. Where should I keep my medicine? Keep out of the reach of children. Store at room temperature between 15 and 30 degrees C (59 and 86 degrees F). Protect from humidity. Throw away any unused medicine after the expiration date. NOTE: This sheet is a summary. It may not cover all possible information. If you have questions about this medicine, talk to your doctor, pharmacist, or health care provider.  2018 Elsevier/Gold Standard (2007-08-11 14:35:47)

## 2017-06-08 NOTE — Progress Notes (Signed)
Cardiology Office Note:    Date:  06/08/2017   ID:  Crystal Garza, DOB December 11, 1970, MRN [id number]  PCP:  Crystal La, MD  Cardiologist:  Crystal Lindau, MD   Referring MD: Crystal La, MD    ASSESSMENT:    1. Angina, class III ([place])   2. HYPERTENSION, BENIGN SYSTEMIC   3. Morbid obesity-BMI 62    PLAN:    In order of problems listed above:  1. Blood pressure is elevated.  She has symptoms which are suggestive of microvascular angina.  In view of this and the elevated heart rate I have initiated on Cardizem CD 120 mg daily.  She will be back in 1 week for a pulse blood pressure check. 2. Her inflammation in the right forearm is completely gone and back to normal.  She is happy about it. 3. Diet was discussed for obesity and dyslipidemia.  Risks of obesity explained and she vocalized understanding. 4. Patient will be seen in follow-up appointment in 3 months or earlier if the patient has any concerns    Medication Adjustments/Labs and Tests Ordered: Current medicines are reviewed at length with the patient today.  Concerns regarding medicines are outlined above.  No orders of the defined types were placed in this encounter.  Meds ordered this encounter  Medications  . diltiazem (CARDIZEM CD) 120 MG 24 hr capsule    Sig: Take 1 capsule (120 mg total) by mouth daily.    Dispense:  90 capsule    Refill:  1     Chief Complaint  Patient presents with  . Follow-up    pt is here for follow up on heart cath.     History of Present Illness:    Crystal Garza is a 47 y.o. female.  The patient underwent coronary angiography for chest pain and was found to be unremarkable.  She complains of chest tightness at times and this is relieved with sublingual nitroglycerin making me wonder whether this is microvascular disease.  No orthopnea or PND.  She had some issues with access in the right wrist and forearm had suggestions of inflammation the last time I saw her.  For this  reason he received a course of antibiotics.  At the time of my evaluation, the patient is alert awake oriented and in no distress.  Past Medical History:  Diagnosis Date  . Anemia    iron def  . Anginal pain ([place])   . Anxiety   . Arthritis    left knee   . Back pain   . Benign gastrointestinal stromal tumor (GIST)   . Chronic knee pain   . Depression   . DVT (deep venous thrombosis) ([place])   . GERD (gastroesophageal reflux disease)   . Hx of laparoscopic gastric banding   . Hypertension   . Kidney stones   . Migraine   . Morbid obesity ([place])   . PE (pulmonary embolism)     Past Surgical History:  Procedure Laterality Date  . [place] STUDY N/A 05/13/2016   Procedure: [place] STUDY;  Surgeon: Manus Gunning, MD;  Location: [facility];  Service: Gastroenterology;  Laterality: N/A;  . ABDOMINAL HYSTERECTOMY    . BIOPSY  02/16/2012   Procedure: BIOPSY;  Surgeon: [name], MD;  Location: [facility];  Service: General;;  biopsy of mass x 2  . BREATH TEK H PYLORI  07/28/2011   Procedure: BREATH TEK H PYLORI;  Surgeon: Rodman Key  Verdie Drown, MD;  Location: [facility];  Service: General;  Laterality: N/A;  to be done at 745  . CESAREAN SECTION  05/1991  . COLONOSCOPY WITH PROPOFOL N/A 03/03/2016   Procedure: COLONOSCOPY WITH PROPOFOL;  Surgeon: Manus Gunning, MD;  Location: [facility];  Service: Gastroenterology;  Laterality: N/A;  . ESOPHAGEAL MANOMETRY N/A 05/13/2016   Procedure: ESOPHAGEAL MANOMETRY (EM);  Surgeon: Manus Gunning, MD;  Location: [facility];  Service: Gastroenterology;  Laterality: N/A;  . ESOPHAGOGASTRODUODENOSCOPY N/A 08/29/2012   Procedure: ESOPHAGOGASTRODUODENOSCOPY (EGD);  Surgeon: [name], MD;  Location: [facility];  Service: General;  Laterality: N/A;  . ESOPHAGOGASTRODUODENOSCOPY N/A 09/04/2014   Procedure: ESOPHAGOGASTRODUODENOSCOPY (EGD);  Surgeon: Alphonsa Overall, MD;  Location: [facility];  Service: General;  Laterality:  N/A;  . ESOPHAGOGASTRODUODENOSCOPY (EGD) WITH PROPOFOL N/A 03/03/2016   Procedure: ESOPHAGOGASTRODUODENOSCOPY (EGD) WITH PROPOFOL;  Surgeon: Manus Gunning, MD;  Location: [facility];  Service: Gastroenterology;  Laterality: N/A;  . EUS  03/03/2012   Procedure: UPPER ENDOSCOPIC ULTRASOUND (EUS) LINEAR;  Surgeon: Milus Banister, MD;  Location: [facility];  Service: Endoscopy;  Laterality: N/A;  . gist    . KNEE ARTHROSCOPY    . LAPAROSCOPIC GASTRECTOMY  04/08/2012   Procedure: LAPAROSCOPIC GASTRECTOMY;  Surgeon: [name], MD;  Location: [facility];  Service: General;;  removal of GIST tumor of stomach  . LAPAROSCOPIC GASTRIC SLEEVE RESECTION N/A 08/29/2012   Procedure: LAPAROSCOPIC GASTRIC SLEEVE RESECTION;  Surgeon: [name], MD;  Location: [facility];  Service: General;  Laterality: N/A;  Sleeve Gastrectomy  . LEFT HEART CATH AND CORONARY ANGIOGRAPHY N/A 05/28/2017   Procedure: LEFT HEART CATH AND CORONARY ANGIOGRAPHY;  Surgeon: Leonie Man, MD;  Location: [facility];  Service: Cardiovascular;  Laterality: N/A;  . TOTAL KNEE ARTHROPLASTY Left 10/06/2013   Procedure: LEFT TOTAL KNEE ARTHROPLASTY;  Surgeon: Mcarthur Rossetti, MD;  Location: [facility];  Service: Orthopedics;  Laterality: Left;  . TUBAL LIGATION      Current Medications: Current Meds  Medication Sig  . ALPRAZolam (XANAX) 1 MG tablet Take one by mouth twice daily as needed (Patient taking differently: Take 1 mg by mouth 2 (two) times daily as needed for anxiety. )  . ALPRAZolam (XANAX) 1 MG tablet Take one by mouth twice daily as needed  . amoxicillin-clavulanate (AUGMENTIN) 875-125 MG tablet Take 1 tablet by mouth 2 (two) times daily.  Marland Kitchen aspirin EC 81 MG tablet Take 2 tablets (162 mg total) by mouth daily.  Marland Kitchen desipramine (NOPRAMIN) 10 MG tablet Take by mouth.  . desipramine (NORPRAMIN) 10 MG tablet Take 1 tablet (10 mg total) by mouth at bedtime.  Marland Kitchen dexlansoprazole (DEXILANT) 60 MG capsule Take  1 capsule (60 mg total) by mouth daily.  . hydrOXYzine (VISTARIL) 25 MG capsule Take 1 capsule (25 mg total) by mouth at bedtime.  Marland Kitchen ibuprofen (ADVIL,MOTRIN) 600 MG tablet Take 1 tablet (600 mg total) by mouth every 6 (six) hours as needed.  . lamoTRIgine (LAMICTAL) 200 MG tablet Take 1 tablet (200 mg total) by mouth at bedtime.  Marland Kitchen lisinopril (PRINIVIL,ZESTRIL) 10 MG tablet Take 1 tablet (10 mg total) by mouth daily.  . methenamine (HIPREX) 1 g tablet Take 1 g 2 (two) times daily with a meal by mouth.   . mupirocin ointment (BACTROBAN) 2 % Apply t o affected skin area twice daily as directed (Patient taking differently: Apply 1 application topically daily. Apply t o affected skin area)  .  nitroGLYCERIN (NITROSTAT) 0.4 MG SL tablet Place 1 tablet (0.4 mg total) under the tongue every 5 (five) minutes as needed. (Patient taking differently: Place 0.4 mg under the tongue every 5 (five) minutes as needed for chest pain. )  . nystatin cream (MYCOSTATIN) APPLY TOPICALLY 2 TIMES A DAY (Patient taking differently: Apply 1 application topically daily as needed (rash). )  . ondansetron (ZOFRAN-ODT) 4 MG disintegrating tablet Take 4 mg by mouth every 8 (eight) hours as needed for nausea.   Marland Kitchen oxyCODONE-acetaminophen (PERCOCET) 10-325 MG tablet Take one by mouth three times a day as directed for chronic neck and leg pain 30 day supply do not filll before May 24 2017 (Patient taking differently: Take 1 tablet by mouth every 8 (eight) hours as needed for pain. Take one by mouth three times a day as directed for chronic neck and leg pain 30 day supply do not filll before May 24 2017)  . polyethylene glycol powder (GLYCOLAX/MIRALAX) powder Taper up to 1 or 2 scoops daily  . risperiDONE (RISPERDAL) 1 MG tablet Take 1 tablet (1 mg total) by mouth at bedtime.  . rivaroxaban (XARELTO) 20 MG TABS tablet Take 1 tablet (20 mg total) by mouth daily with supper.  Marland Kitchen tiZANidine (ZANAFLEX) 4 MG tablet Take 1 tablet (4 mg total) by  mouth 2 (two) times daily as needed for muscle spasms. (Patient taking differently: Take 4 mg by mouth 2 (two) times daily. )  . VESICARE 5 MG tablet Take 5 mg by mouth daily as needed.      Allergies:   Codeine   Social History   Socioeconomic History  . Marital status: Single    Spouse name: None  . Number of children: None  . Years of education: None  . Highest education level: None  Social Needs  . Financial resource strain: None  . Food insecurity - worry: None  . Food insecurity - inability: None  . Transportation needs - medical: None  . Transportation needs - non-medical: None  Occupational History  . None  Tobacco Use  . Smoking status: Never Smoker  . Smokeless tobacco: Never Used  Substance and Sexual Activity  . Alcohol use: No    Alcohol/week: 0.0 oz    Frequency: Never  . Drug use: No  . Sexual activity: None  Other Topics Concern  . None  Social History Narrative   Patient has been on disability since 2004 for knee pain, back pain, depression and acid reflux. She does work part-time now.       She is caring for her granddaughter Amia age 45 (has sickle cell, goes to [facility] every month and has had multiple hospitalizations).      Family History: The patient's family history includes Colitis in her father; Colon cancer in her father; Colon polyps in her father; Diabetes in her father, maternal aunt, maternal grandfather, maternal grandmother, maternal uncle, mother, paternal aunt, and paternal uncle; Heart disease in her father, paternal grandfather, paternal grandmother, and paternal uncle; Irritable bowel syndrome in her father; Schizophrenia in her maternal grandfather.  ROS:   Please see the history of present illness.    All other systems reviewed and are negative.  EKGs/Labs/Other Studies Reviewed:    The following studies were reviewed today: Primary prevention stressed with the patient.  Importance of compliance with diet and medications stressed and  she vocalized understanding.   Recent Labs: 05/19/2017: ALT 7; BUN 8; Creatinine, Ser 0.65; Hemoglobin 12.2; Platelets 465; Potassium 4.6; Sodium  141  Recent Lipid Panel    Component Value Date/Time   CHOL 170 05/19/2017 0920   TRIG 82 05/19/2017 0920   HDL 51 05/19/2017 0920   CHOLHDL 3.3 05/19/2017 0920   CHOLHDL 3.0 10/17/2014 1020   VLDL 15 10/17/2014 1020   LDLCALC 103 (H) 05/19/2017 0920   LDLDIRECT 95 11/04/2011 0940    Physical Exam:    VS:  BP 128/70 (BP Location: Right Arm, Patient Position: Sitting, Cuff Size: Large)   Pulse (!) 107   Ht 5\' 7"  (1.702 m)   Wt (!) 347 lb (157.4 kg)   SpO2 98%   BMI 54.35 kg/m     Wt Readings from Last 3 Encounters:  06/08/17 (!) 347 lb (157.4 kg)  05/28/17 (!) 340 lb (154.2 kg)  05/25/17 (!) 346 lb 1.3 oz (157 kg)     GEN: Patient is in no acute distress HEENT: Normal NECK: No JVD; No carotid bruits LYMPHATICS: No lymphadenopathy CARDIAC: Hear sounds regular, 2/6 systolic murmur at the apex. RESPIRATORY:  Clear to auscultation without rales, wheezing or rhonchi  ABDOMEN: Soft, non-tender, non-distended MUSCULOSKELETAL:  No edema; No deformity  SKIN: Warm and dry NEUROLOGIC:  Alert and oriented x 3 PSYCHIATRIC:  Normal affect   Signed, Crystal Lindau, MD  06/08/2017 10:09 AM    [place]

## 2017-06-09 ENCOUNTER — Telehealth: Payer: Self-pay | Admitting: *Deleted

## 2017-06-09 DIAGNOSIS — M546 Pain in thoracic spine: Secondary | ICD-10-CM | POA: Diagnosis not present

## 2017-06-09 DIAGNOSIS — N62 Hypertrophy of breast: Secondary | ICD-10-CM | POA: Diagnosis not present

## 2017-06-09 DIAGNOSIS — G8929 Other chronic pain: Secondary | ICD-10-CM | POA: Diagnosis not present

## 2017-06-09 DIAGNOSIS — M545 Low back pain: Secondary | ICD-10-CM | POA: Diagnosis not present

## 2017-06-09 NOTE — Telephone Encounter (Signed)
Received fax from [facility] with note that says- Patient is no sure if she is to be on this med.  Her last fill was 11/2016. Could you please clarify?  She did get a new Rx for diltiazem CD today but is unsure about lisinopril. Please advise. Katharina Caper, Leinani Lisbon D, [place]

## 2017-06-10 NOTE — Telephone Encounter (Signed)
No answer will call again later.

## 2017-06-10 NOTE — Telephone Encounter (Signed)
Dear Crystal Garza Team I reviewed her chart. I never stopped it so unsure why her last fill was July. Cardiology ADDED cardizem. She should stay on it for now (or go back on it as the case may be).   Tell her to mention this to her cardiologist when she sees him back.   If she starts to feel dizzy or have sx of low blood pressure, have her take some BP readings and call us. THANKS! [name]

## 2017-06-10 NOTE — Telephone Encounter (Signed)
Left voicemail for the patient to call the office. 

## 2017-06-15 NOTE — Telephone Encounter (Signed)
Left voicemail informing patient that she should in fact be taking her ace inhibitor.

## 2017-06-23 ENCOUNTER — Other Ambulatory Visit: Payer: Self-pay

## 2017-06-23 ENCOUNTER — Ambulatory Visit (INDEPENDENT_AMBULATORY_CARE_PROVIDER_SITE_OTHER): Payer: Medicare Other | Admitting: Family Medicine

## 2017-06-23 ENCOUNTER — Encounter: Payer: Self-pay | Admitting: Family Medicine

## 2017-06-23 DIAGNOSIS — Z7901 Long term (current) use of anticoagulants: Secondary | ICD-10-CM | POA: Diagnosis not present

## 2017-06-23 DIAGNOSIS — F3170 Bipolar disorder, currently in remission, most recent episode unspecified: Secondary | ICD-10-CM | POA: Diagnosis not present

## 2017-06-23 DIAGNOSIS — I10 Essential (primary) hypertension: Secondary | ICD-10-CM

## 2017-06-23 DIAGNOSIS — G894 Chronic pain syndrome: Secondary | ICD-10-CM | POA: Diagnosis not present

## 2017-06-23 MED ORDER — OXYCODONE-ACETAMINOPHEN 10-325 MG PO TABS: ORAL_TABLET | ORAL | 0 refills | Status: DC

## 2017-06-23 MED ORDER — LISINOPRIL 10 MG PO TABS: 10.0000 mg | ORAL_TABLET | Freq: Every day | ORAL | 3 refills | Status: DC

## 2017-06-25 ENCOUNTER — Encounter: Payer: Self-pay | Admitting: Family Medicine

## 2017-06-25 NOTE — Assessment & Plan Note (Signed)
Will need to stop and bridge if she has surgery and she is aware

## 2017-06-25 NOTE — Assessment & Plan Note (Signed)
Somehow she had stopped her ACE so we restarted that. I reviewed her cath report and good HTN control is important as they were concerned aboit  Microvascular angina. We discussed this.

## 2017-06-25 NOTE — Progress Notes (Signed)
    CHIEF COMPLAINT / HPI:  Continued problems with upper and mid back pain. Saw the surgeon and is preparing to get breast reduction. She is nervous about it but tiired of the back pain  Left knee pain unchanged.   Lots of stress at home. Is having occasional overliad of stress and thinks this is contributing to her intermittent chest pains, general fatigue.  REVIEW OF SYSTEMS: Pertinent review of systems: negative for fever or unusual weight change.   PERTINENT  PMH / PSH: I have reviewed the patient's medications, allergies, past medical and surgical history, smoking status and updated in the EMR as appropriate.   OBJECTIVE:  Vital signs reviewed. GENERAL: Well-developed, well-nourished, no acute distress. CARDIOVASCULAR: No tachycardia or bradycardia. LUNGS: Normal respiratory effort.Normal respiratory rate.. ABDOMEN: Soft positive bowel sounds MSK: Movement of extremity x 4. BACK: upper back muscles TTP diffusely PSYCH Affect interactive. Normal speech fluency and content. AxOx4.    ASSESSMENT / PLAN: Please see problem oriented charting for details

## 2017-06-25 NOTE — Assessment & Plan Note (Signed)
Discussed breast reduction. I think it would certainly help her back pain.

## 2017-06-25 NOTE — Assessment & Plan Note (Signed)
Lots of stress right now but mood is stable.

## 2017-07-01 ENCOUNTER — Ambulatory Visit (INDEPENDENT_AMBULATORY_CARE_PROVIDER_SITE_OTHER): Payer: Medicare Other | Admitting: Gastroenterology

## 2017-07-01 ENCOUNTER — Encounter: Payer: Self-pay | Admitting: Gastroenterology

## 2017-07-01 VITALS — BP 118/86 | HR 92 | Ht 67.0 in | Wt 346.6 lb

## 2017-07-01 DIAGNOSIS — R079 Chest pain, unspecified: Secondary | ICD-10-CM | POA: Diagnosis not present

## 2017-07-01 DIAGNOSIS — K824 Cholesterolosis of gallbladder: Secondary | ICD-10-CM

## 2017-07-01 DIAGNOSIS — K219 Gastro-esophageal reflux disease without esophagitis: Secondary | ICD-10-CM | POA: Diagnosis not present

## 2017-07-01 DIAGNOSIS — R1084 Generalized abdominal pain: Secondary | ICD-10-CM | POA: Diagnosis not present

## 2017-07-01 MED ORDER — DESIPRAMINE HCL 10 MG PO TABS: 20.0000 mg | ORAL_TABLET | Freq: Every day | ORAL | 3 refills | Status: DC

## 2017-07-01 MED ORDER — ALUM HYDROXIDE-MAG TRISILICATE 80-14.2 MG PO CHEW: CHEWABLE_TABLET | ORAL | Status: DC

## 2017-07-01 MED ORDER — AMBULATORY NON FORMULARY MEDICATION: Status: DC

## 2017-07-01 NOTE — Progress Notes (Signed)
HPI :  47 y/o female with multiple medical problems, here for a follow up visit for reflux, abdominal pain. ,  She had an EGD and colonoscopy since her last visit, both done on 03/03/16.  EGD showed mild esophagitis at the GEJ, 2cm hiatal hernia. with evidence of gastric sleeve surgery. Biopsies negative for H pylori. She transitioned to Dexilant 60mg  daily.  She has been on Dexilant since then.    Results of manometry and 24 Hr pH impedance studies Esophageal manometry  - quality of study is decreased by double swallows - normal resting LES pressure with complete relaxation - 100% of swallows had peristaltic contractions, but 50% of swallows had large breaks in peristalsis  Overall, suboptimal study due to double swallows however fragmented peristalsis noted with some poor bolus clearance, but no other major abnormality, no achalasia  24 HR pH impedance study: Study performed ON PPI (Dexilant)  Results: - Study time of 24 hrs: adequate study time - Demeester score of 5.5 (14.72 = upper limit of normal) - no evidence of pathologic reflux - Symptom index of only 33% (3/9) for acid reflux episodes, and 44% (4/9) for all reflux episodes  - most reflux episodes that occurred were weakly acidic (32/42)  Summary and Interpretation: - study consistent with controlled acid reflux disease on PPI - symptoms reported did NOT correlate well with reflux episodes - there could be a component of functional heartburn to symptoms in addition to nonspecific changes noted on manometry    The patient continues to take Xarelto for history of DVT.  She is been seen by cardiology recently, had a cardiac catheterization for evaluation of chest pain.  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  She remains obese despite gastric sleeve in 2014.  She states her highest weight was close to 400 pounds.  She was close to 300 pounds at one point but has since regained weight.  Now  around 340 pounds.  She states she continues to feel reflux despite taking excellent 60 mg once a day.  She continues to have intermittent chest pains.  She has not found any benefit with Carafate.  I previously placed her on desipramine 10 mg a day with plans on increasing the dose as tolerated.  She does think this helped at one point time.  She also has some postprandial burning and distress.  She has chronic "burning sensation" in the LUQ. She reports it is presently constantly, 24/7. She reports present at least for a few years. Nothing makes it better or worse, but tender to the touch. She's had a prior CT scan of the abdomen  which did not show any pathology to account for her symptoms.  She is also followed by rheumatology for chronic pain syndrome   Colonoscopy 03/03/2016 - normal exam, no polyps, recommended a repeat colonoscopy in 5 years given strong FH of colon cancer.   She had an ultrasound done in January 2019 for reassessment of gallbladder polyp which was 6 mm and stable in size.  No gallstones  Past Medical History:  Diagnosis Date  . Anemia    iron def  . Anginal pain ([place])   . Anxiety   . Arthritis    left knee   . Back pain   . Benign gastrointestinal stromal tumor (GIST)   . Chronic knee pain   . Depression   . DVT (deep venous thrombosis) ([place])   . GERD (gastroesophageal reflux disease)   . Hx  of laparoscopic gastric banding   . Hypertension   . Kidney stones   . Migraine   . Morbid obesity ([place])   . PE (pulmonary embolism)      Past Surgical History:  Procedure Laterality Date  . [place] STUDY N/A 05/13/2016   Procedure: [place] STUDY;  Surgeon: Manus Gunning, MD;  Location: [facility];  Service: Gastroenterology;  Laterality: N/A;  . ABDOMINAL HYSTERECTOMY    . BIOPSY  02/16/2012   Procedure: BIOPSY;  Surgeon: [name], MD;  Location: [facility];  Service: General;;  biopsy of mass x 2  . BREATH TEK H PYLORI  07/28/2011    Procedure: [place];  Surgeon: [name], MD;  Location: [facility];  Service: General;  Laterality: N/A;  to be done at 745  . CESAREAN SECTION  05/1991  . COLONOSCOPY WITH PROPOFOL N/A 03/03/2016   Procedure: COLONOSCOPY WITH PROPOFOL;  Surgeon: Manus Gunning, MD;  [facility]: [facility];  Service: Gastroenterology;  Laterality: N/A;  . ESOPHAGEAL MANOMETRY N/A 05/13/2016   Procedure: ESOPHAGEAL MANOMETRY (EM);  Surgeon: Manus Gunning, MD;  [facility];  Service: Gastroenterology;  Laterality: N/A;  . ESOPHAGOGASTRODUODENOSCOPY N/A 08/29/2012   Procedure: ESOPHAGOGASTRODUODENOSCOPY (EGD);  Surgeon: [name], MD;  [facility];  Service: General;  Laterality: N/A;  . ESOPHAGOGASTRODUODENOSCOPY N/A 09/04/2014   Procedure: ESOPHAGOGASTRODUODENOSCOPY (EGD);  Surgeon: Alphonsa Overall, MD;  Location: [facility];  Service: General;  Laterality: N/A;  . ESOPHAGOGASTRODUODENOSCOPY (EGD) WITH PROPOFOL N/A 03/03/2016   Procedure: ESOPHAGOGASTRODUODENOSCOPY (EGD) WITH PROPOFOL;  Surgeon: Manus Gunning, MD;  [facility];  Service: Gastroenterology;  Laterality: N/A;  . EUS  03/03/2012   Procedure: UPPER ENDOSCOPIC ULTRASOUND (EUS) LINEAR;  Surgeon: Milus Banister, MD;  [facility];  Service: Endoscopy;  Laterality: N/A;  . gist    . KNEE ARTHROSCOPY    . LAPAROSCOPIC GASTRECTOMY  04/08/2012   Procedure: LAPAROSCOPIC GASTRECTOMY;  Surgeon: [name], MD;  [facility];  Service: General;;  removal of GIST tumor of stomach  . LAPAROSCOPIC GASTRIC SLEEVE RESECTION N/A 08/29/2012   Procedure: LAPAROSCOPIC GASTRIC SLEEVE RESECTION;  Surgeon: [name], MD;  [facility];  Service: General;  Laterality: N/A;  Sleeve Gastrectomy  . LEFT HEART CATH AND CORONARY ANGIOGRAPHY N/A 05/28/2017   Procedure: LEFT HEART CATH AND CORONARY ANGIOGRAPHY;  Surgeon: Leonie Man, MD;  Location: [facility];   Service: Cardiovascular;  Laterality: N/A;  . TOTAL KNEE ARTHROPLASTY Left 10/06/2013   Procedure: LEFT TOTAL KNEE ARTHROPLASTY;  Surgeon: Mcarthur Rossetti, MD;  Location: [facility];  Service: Orthopedics;  Laterality: Left;  . TUBAL LIGATION     Family History  Problem Relation Age of Onset  . Diabetes Mother   . Diabetes Father   . Heart disease Father   . Colon cancer Father        brain tumor  . Colon polyps Father   . Colitis Father   . Irritable bowel syndrome Father   . Diabetes Maternal Grandmother   . Diabetes Maternal Grandfather   . Schizophrenia Maternal Grandfather   . Heart disease Paternal Uncle   . Diabetes Paternal Uncle   . Heart disease Paternal Grandmother   . Heart disease Paternal Grandfather   . Diabetes Maternal Aunt   . Diabetes Maternal Uncle   . Diabetes Paternal Aunt    Social History   Tobacco Use  . Smoking  status: Never Smoker  . Smokeless tobacco: Never Used  Substance Use Topics  . Alcohol use: No    Alcohol/week: 0.0 oz    Frequency: Never  . Drug use: No   Current Outpatient Medications  Medication Sig Dispense Refill  . ALPRAZolam (XANAX) 1 MG tablet Take one by mouth twice daily as needed    . aspirin EC 81 MG tablet Take 2 tablets (162 mg total) by mouth daily. 90 tablet 3  . desipramine (NORPRAMIN) 10 MG tablet Take 1 tablet (10 mg total) by mouth at bedtime. 30 tablet 3  . dexlansoprazole (DEXILANT) 60 MG capsule Take 1 capsule (60 mg total) by mouth daily. 90 capsule 3  . diltiazem (CARDIZEM CD) 120 MG 24 hr capsule Take 1 capsule (120 mg total) by mouth daily. 90 capsule 1  . hydrOXYzine (VISTARIL) 25 MG capsule Take 1 capsule (25 mg total) by mouth at bedtime. 30 capsule 2  . ibuprofen (ADVIL,MOTRIN) 600 MG tablet Take 1 tablet (600 mg total) by mouth every 6 (six) hours as needed. 30 tablet 0  . lamoTRIgine (LAMICTAL) 200 MG tablet Take 1 tablet (200 mg total) by mouth at bedtime. 90 tablet 3  . lisinopril (PRINIVIL,ZESTRIL)  10 MG tablet Take 1 tablet (10 mg total) by mouth daily. 90 tablet 3  . oxyCODONE-acetaminophen (PERCOCET) 10-325 MG tablet Take one by mouth four times a day as directed for chronic neck and leg pain 30 day supply do not fill before August 21 2017 120 tablet 0  . risperiDONE (RISPERDAL) 1 MG tablet Take 1 tablet (1 mg total) by mouth at bedtime. 90 tablet 3  . rivaroxaban (XARELTO) 20 MG TABS tablet Take 1 tablet (20 mg total) by mouth daily with supper. 90 tablet 3   No current facility-administered medications for this visit.    Allergies  Allergen Reactions  . Codeine Rash     Review of Systems: All systems reviewed and negative except where noted in HPI.   Lab Results  Component Value Date   WBC 10.4 05/19/2017   HGB 12.2 05/19/2017   HCT 39.2 05/19/2017   MCV 84 05/19/2017   PLT 465 (H) 05/19/2017    Lab Results  Component Value Date   CREATININE 0.65 05/19/2017   BUN 8 05/19/2017   NA 141 05/19/2017   K 4.6 05/19/2017   CL 103 05/19/2017   CO2 21 05/19/2017    Lab Results  Component Value Date   ALT 7 05/19/2017   AST 14 05/19/2017   ALKPHOS 62 05/19/2017   BILITOT 0.3 05/19/2017     Physical Exam: BP 118/86   Pulse 92   Ht 5\' 7"  (1.702 m)   Wt (!) 346 lb 9.6 oz (157.2 kg)   BMI 54.29 kg/m  Constitutional: Pleasant, female in no acute distress. HEENT: Normocephalic and atraumatic. Conjunctivae are normal. No scleral icterus. Neck supple.  Cardiovascular: Normal rate, regular rhythm.  Pulmonary/chest: Effort normal and breath sounds normal. No wheezing, rales or rhonchi. Abdominal: Soft, obese abdomen chronic LUQ TTP, here are no masses palpable. No hepatomegaly. Extremities: no edema Lymphadenopathy: No cervical adenopathy noted. Neurological: Alert and oriented to person place and time. Skin: Skin is warm and dry. No rashes noted. Psychiatric: Normal mood and affect. Behavior is normal.   ASSESSMENT AND PLAN: 47 year old female here for  reassessment of the following issues:  Chest pain / GERD / chronic abdominal pain / morbid obesity - significant cardiac workup recently, being treated for microvascular angina.  Prior esophageal manometry showed fragmented peristalsis but no significant motility disorder.  Her pH study showed no significant acid reflux, and she had poor symptom correlation to reflux episodes.  Based on her pH study, poor symptom correlation with acid reflux episodes, and no improvement with other antacid such as Carafate, I suspect she has functional heartburn in regards to her symptoms of pyrosis / chest pain  Recommend we titrate up her TCA as she tolerates it.  We will increase desipramine to 20 mg nightly.  She does have a component of dyspepsia, recommend trial of FD Donald Prose for this.  We discussed her weight at length.  I think she would feel much better if she were able to lose more weight, she will discuss this further with her bariatric surgeon.  Otherwise her chronic left upper abdominal pain appears to be musculoskeletal / neuropathic, no pathology on prior CT - again this would hopefully improve with weight loss.   Gall bladder polyp - will repeat US in one year given 68mm GB polyp, assess for interval changes. I don't think this is causing symptoms at present time.   Dana Cellar, MD [facility] Pager [phone]

## 2017-07-01 NOTE — Patient Instructions (Addendum)
If you are age 49 or older, your body mass index should be between 23-30. Your Body mass index is 54.29 kg/m. If this is out of the aforementioned range listed, please consider follow up with your Primary Care Provider.  If you are age 45 or younger, your body mass index should be between 19-25. Your Body mass index is 54.29 kg/m. If this is out of the aformentioned range listed, please consider follow up with your Primary Care Provider.   We have sent the following medications to your pharmacy for you to pick up at your convenience: Desipramine 20mg : Take one tablet at bedtime  Please purchase the following medications over the counter and take as directed: Gaviscon, use as needed. FDgard - use for dyspepsia, as needed  Thank you for entrusting me with your care and for choosing [employer], Dr. Valrico Cellar

## 2017-07-07 DIAGNOSIS — K219 Gastro-esophageal reflux disease without esophagitis: Secondary | ICD-10-CM | POA: Diagnosis not present

## 2017-07-13 ENCOUNTER — Ambulatory Visit: Payer: Medicare Other | Admitting: Neurology

## 2017-07-21 ENCOUNTER — Other Ambulatory Visit (HOSPITAL_COMMUNITY): Payer: Self-pay | Admitting: Surgery

## 2017-07-26 ENCOUNTER — Other Ambulatory Visit: Payer: Self-pay | Admitting: Family Medicine

## 2017-07-29 ENCOUNTER — Encounter: Payer: Self-pay | Admitting: Registered"

## 2017-07-29 ENCOUNTER — Encounter: Payer: Medicare Other | Attending: Surgery | Admitting: Registered"

## 2017-07-29 DIAGNOSIS — R110 Nausea: Secondary | ICD-10-CM | POA: Insufficient documentation

## 2017-07-29 DIAGNOSIS — E661 Drug-induced obesity: Secondary | ICD-10-CM | POA: Diagnosis not present

## 2017-07-29 DIAGNOSIS — K219 Gastro-esophageal reflux disease without esophagitis: Secondary | ICD-10-CM | POA: Insufficient documentation

## 2017-07-29 DIAGNOSIS — Z9884 Bariatric surgery status: Secondary | ICD-10-CM | POA: Diagnosis not present

## 2017-07-29 DIAGNOSIS — Z713 Dietary counseling and surveillance: Secondary | ICD-10-CM | POA: Diagnosis not present

## 2017-07-29 DIAGNOSIS — E669 Obesity, unspecified: Secondary | ICD-10-CM

## 2017-07-29 NOTE — Progress Notes (Signed)
Pre-Op Assessment Visit:  Pre-Operative Sleeve conversion to RYGB Surgery  Medical Nutrition Therapy:  Appt start time: 4:00  End time:  5:00  Patient was seen on 07/29/2017 for Pre-Operative Nutrition Assessment. Assessment and letter of approval faxed to [facility] Bariatric Surgery Program coordinator on 07/29/2017.   Pt expectation of surgery: help with GERD  Pt expectation of Dietitian: none stated  Start weight at [facility]: 348.4 BMI: 57.10   Pt states she had sleeve in 2014 with CCS. Pt states she has had a lot of issues with acid reflux, hernia, 2 endoscopies, etc since having the sleeve gastrectomy. Pt states she started back eating chips, potatoes due to laziness and not wanting to cook.    Pt states will verify amount of SWL visits needed prior to surgery.    24 hr Dietary Recall: First Meal: coffee Snack: sometimes cheese, crackers Second Meal: [employer]-grilled chicken sandwich, fries Snack: sometimes apples with caramel dip or chips Third Meal: spaghetti, salad Snack: none Beverages: coffee, soda, unsweetened/sweetened tea, water, orange juice  Encouraged to engage in 75 minutes of moderate physical activity including cardiovascular and weight baring weekly  Handouts given during visit include:  . Pre-Op Goals . Bariatric Surgery Protein Shakes . Vitamin and Mineral Recommendations  During the appointment today the following Pre-Op Goals were reviewed with the patient: . Track your food and beverage: MyFitness Pal or Baritastic App . Make healthy food choices . Begin to limit portion sizes . Limited concentrated sugars and fried foods . Keep fat/sugar in the single digits per serving on food labels . Practice CHEWING your food  (aim for 30 chews per bite or until applesauce consistency) . Practice not drinking 15 minutes before, during, and 30 minutes after each meal/snack . Avoid all carbonated beverages  . Avoid/limit caffeinated beverages   . Avoid all sugar-sweetened beverages . Avoid alcohol . Consume 3 meals per day; eat every 3-5 hours . Make a list of non-food related activities . Aim for 64-100 ounces of FLUID daily  . Aim for at least 60-80 grams of PROTEIN daily . Look for a liquid protein source that contain ?15 g protein and ?5 g carbohydrate  (ex: shakes, drinks, shots) . Physical activity is an important part of a healthy lifestyle so keep it moving!  Follow diet recommendations listed below Energy and Macronutrient Recommendations: Calories: 1500 Carbohydrate: 170 Protein: 112 Fat: 42  Demonstrated degree of understanding via:  Teach Back   Teaching Method Utilized:  Visual Auditory Hands on  Barriers to learning/adherence to lifestyle change: none identified  Patient to call the Nutrition and Diabetes Education Services to enroll in Pre-Op and Post-Op Nutrition Education when surgery date is scheduled.

## 2017-08-09 ENCOUNTER — Encounter: Payer: Self-pay | Admitting: Cardiology

## 2017-08-10 ENCOUNTER — Ambulatory Visit: Payer: Medicare Other | Admitting: Psychiatry

## 2017-08-19 ENCOUNTER — Ambulatory Visit: Payer: Medicare Other | Admitting: Psychiatry

## 2017-08-24 ENCOUNTER — Ambulatory Visit: Payer: Medicare Other | Admitting: Psychiatry

## 2017-08-24 ENCOUNTER — Other Ambulatory Visit: Payer: Self-pay | Admitting: Family Medicine

## 2017-08-24 DIAGNOSIS — K2100 Gastro-esophageal reflux disease with esophagitis, without bleeding: Secondary | ICD-10-CM

## 2017-08-24 DIAGNOSIS — R1310 Dysphagia, unspecified: Secondary | ICD-10-CM

## 2017-09-01 ENCOUNTER — Other Ambulatory Visit: Payer: Self-pay

## 2017-09-01 ENCOUNTER — Encounter: Payer: Self-pay | Admitting: Family Medicine

## 2017-09-01 ENCOUNTER — Ambulatory Visit (INDEPENDENT_AMBULATORY_CARE_PROVIDER_SITE_OTHER): Payer: Medicare Other | Admitting: Family Medicine

## 2017-09-01 ENCOUNTER — Ambulatory Visit: Payer: Medicare Other | Admitting: Psychiatry

## 2017-09-01 DIAGNOSIS — N62 Hypertrophy of breast: Secondary | ICD-10-CM | POA: Diagnosis not present

## 2017-09-01 DIAGNOSIS — G8929 Other chronic pain: Secondary | ICD-10-CM | POA: Diagnosis not present

## 2017-09-01 DIAGNOSIS — M542 Cervicalgia: Secondary | ICD-10-CM | POA: Diagnosis not present

## 2017-09-01 DIAGNOSIS — M549 Dorsalgia, unspecified: Secondary | ICD-10-CM | POA: Diagnosis not present

## 2017-09-01 DIAGNOSIS — Z7901 Long term (current) use of anticoagulants: Secondary | ICD-10-CM

## 2017-09-01 DIAGNOSIS — Z903 Acquired absence of stomach [part of]: Secondary | ICD-10-CM

## 2017-09-01 MED ORDER — OXYCODONE-ACETAMINOPHEN 10-325 MG PO TABS: ORAL_TABLET | ORAL | 0 refills | Status: DC

## 2017-09-02 NOTE — Progress Notes (Signed)
    CHIEF COMPLAINT / HPI: Having some reflux problems.  Her surgeon thinks that her gastric sleeve surgery is not working for her and they plan to consider gastric bypass in the near future. 2.   still not been able to get in to see the doctor about breast reduction 3.  Needs refill on her pain medicines #4.  Most to review her health maintenance issues. 5.  Stressors with her granddaughter who has sickle cell problems.  REVIEW OF SYSTEMS: No vomiting, no diarrhea.  She does have intermittent abdominal pain.  No unusual fatigue.  Sleeping normally.  Continues to have knee pain.  No chest pain.  No lower extremity edema.  PERTINENT  PMH / PSH: I have reviewed the patient's medications, allergies, past medical and surgical history, smoking status and updated in the EMR as appropriate.   OBJECTIVE: Vital signs reviewed GENERALl: Well developed, well nourished, in no acute distress.  Obese HEENT: PERRLA, EOMI, sclerae are nonicteric NECK: Supple, FROM, without lymphadenopathy.  THYROID: normal without nodularity CAROTID ARTERIES: without bruits LUNGS: clear to auscultation bilaterally. No wheezes or rales. Normal respiratory effort HEART: Regular rate and rhythm, no murmurs. Distal pulses are bilaterally symmetrical, 2+. ABDOMEN: soft with positive bowel sounds. No masses noted MSK: MOE x 4. Normal muscle strength, bulk and tone. SKIN no rash. Normal temperature. NEURO: no focal deficits. Normal gait. Normal balance.   ASSESSMENT / PLAN: Please see problem oriented charting for details

## 2017-09-02 NOTE — Assessment & Plan Note (Signed)
I think this will improve if she did have breast reduction.

## 2017-09-02 NOTE — Assessment & Plan Note (Addendum)
We will continue.  Recommend lifelong coagulation.

## 2017-09-02 NOTE — Assessment & Plan Note (Signed)
She may be getting ready to have revision to gastric bypass.  We discussed

## 2017-09-02 NOTE — Assessment & Plan Note (Signed)
I will see back and get this referral if she straightened out.

## 2017-09-15 ENCOUNTER — Ambulatory Visit (HOSPITAL_COMMUNITY)
Admission: RE | Admit: 2017-09-15 | Discharge: 2017-09-15 | Disposition: A | Discharge status: Home / Self Care | Payer: Medicare Other | Source: Ambulatory Visit | Attending: Surgery | Admitting: Surgery

## 2017-09-15 DIAGNOSIS — R0602 Shortness of breath: Secondary | ICD-10-CM | POA: Diagnosis not present

## 2017-09-15 DIAGNOSIS — R079 Chest pain, unspecified: Secondary | ICD-10-CM | POA: Diagnosis not present

## 2017-09-15 DIAGNOSIS — K449 Diaphragmatic hernia without obstruction or gangrene: Secondary | ICD-10-CM | POA: Insufficient documentation

## 2017-09-15 DIAGNOSIS — K219 Gastro-esophageal reflux disease without esophagitis: Secondary | ICD-10-CM | POA: Insufficient documentation

## 2017-09-15 DIAGNOSIS — R05 Cough: Secondary | ICD-10-CM | POA: Diagnosis not present

## 2017-09-15 DIAGNOSIS — R110 Nausea: Secondary | ICD-10-CM | POA: Diagnosis present

## 2017-09-22 ENCOUNTER — Ambulatory Visit: Payer: Medicare Other | Admitting: Allergy

## 2017-10-14 ENCOUNTER — Ambulatory Visit (INDEPENDENT_AMBULATORY_CARE_PROVIDER_SITE_OTHER): Payer: Medicare Other

## 2017-10-14 ENCOUNTER — Encounter: Payer: Self-pay | Admitting: Podiatry

## 2017-10-14 ENCOUNTER — Other Ambulatory Visit: Payer: Self-pay | Admitting: Podiatry

## 2017-10-14 ENCOUNTER — Ambulatory Visit (INDEPENDENT_AMBULATORY_CARE_PROVIDER_SITE_OTHER): Payer: Medicare Other | Admitting: Podiatry

## 2017-10-14 ENCOUNTER — Other Ambulatory Visit: Payer: Self-pay

## 2017-10-14 VITALS — Ht 67.0 in | Wt 340.0 lb

## 2017-10-14 DIAGNOSIS — S99912A Unspecified injury of left ankle, initial encounter: Secondary | ICD-10-CM

## 2017-10-14 DIAGNOSIS — M79672 Pain in left foot: Secondary | ICD-10-CM | POA: Diagnosis not present

## 2017-10-14 DIAGNOSIS — M25571 Pain in right ankle and joints of right foot: Secondary | ICD-10-CM

## 2017-10-14 DIAGNOSIS — M779 Enthesopathy, unspecified: Secondary | ICD-10-CM | POA: Diagnosis not present

## 2017-10-14 DIAGNOSIS — S93402A Sprain of unspecified ligament of left ankle, initial encounter: Secondary | ICD-10-CM

## 2017-10-14 NOTE — Telephone Encounter (Signed)
Patient called for refills. Made aware to call pharmacy for next refill and have pharmacy contact us. Danley Danker, RN [facility] RN)

## 2017-10-15 MED ORDER — TIZANIDINE HCL 4 MG PO TABS: 4.0000 mg | ORAL_TABLET | Freq: Two times a day (BID) | ORAL | 3 refills | Status: DC | PRN

## 2017-10-15 MED ORDER — OXYCODONE-ACETAMINOPHEN 10-325 MG PO TABS: ORAL_TABLET | ORAL | 0 refills | Status: DC

## 2017-10-18 NOTE — Progress Notes (Signed)
Subjective: 47 year old female presents the office today for concerns of left ankle pain, pointing the lateral aspect is been ongoing for the last couple of days.  She states that she was going on the steps and she twisted her ankle she describes an inversion type injury.  She states that she was having swelling however the swelling is much improved today.  Her pain level 6/10.  Over the last 2 weeks she is been getting some pain to the onset aspect the right ankle she points along the lateral aspect.  No recent injury to the right side.  She has a minimal swelling to the right side.  Before that she states that her feet were doing better.  No redness to her feet no open sores. Denies any systemic complaints such as fevers, chills, nausea, vomiting. No acute changes since last appointment, and no other complaints at this time.   Objective: AAO x3, NAD DP/PT pulses palpable bilaterally, CRT less than 3 seconds There is mild discomfort to palpation along the lateral aspect of the ankle on the ATFL.  No discomfort on the PT FL, CFL syndesmosis or deltoid ligaments.  No proximal tib-fib pain.  There is mild diffuse tenderness to the dorsal aspect the left midfoot and there is minimal swelling to the area.  There is no erythema or increase in warmth.  Mild discomfort on the course of the peroneal tendon bilaterally just posterior and inferior to the lateral malleolus.  Peroneal tendons appear to be intact.  No pain to the Achilles tendon which also appear to be intact. No open lesions or pre-ulcerative lesions.  No pain with calf compression, swelling, warmth, erythema  Assessment: 47 year old female left ankle sprain, peroneal tendinitis with midfoot pain  Plan: -All treatment options discussed with the patient including all alternatives, risks, complications.  -X-rays were obtained and reviewed.  On the AP view of the left foot x-ray was concerning for possible radiolucency in the cuneiform however this  was evident on prior x-rays.  Oblique views were also obtained which did not reveal any definitive evidence of acute fracture. -Given the pain that she is having on the left side recommended immobilization in a cam boot which was dispensed today.  Ice to the area as well as try to limit activity. -For the right side stretching, icing exercises daily.  Discussed the change in shoes during wearing better supportive shoes. -Patient encouraged to call the office with any questions, concerns, change in symptoms.    Trula Slade DPM

## 2017-11-08 ENCOUNTER — Encounter: Payer: Self-pay | Admitting: Podiatry

## 2017-11-08 ENCOUNTER — Ambulatory Visit (INDEPENDENT_AMBULATORY_CARE_PROVIDER_SITE_OTHER): Payer: Medicare Other | Admitting: Podiatry

## 2017-11-08 DIAGNOSIS — M25572 Pain in left ankle and joints of left foot: Secondary | ICD-10-CM | POA: Diagnosis not present

## 2017-11-08 DIAGNOSIS — R609 Edema, unspecified: Secondary | ICD-10-CM

## 2017-11-08 DIAGNOSIS — M779 Enthesopathy, unspecified: Secondary | ICD-10-CM

## 2017-11-09 ENCOUNTER — Telehealth: Payer: Self-pay | Admitting: Podiatry

## 2017-11-09 ENCOUNTER — Telehealth: Payer: Self-pay | Admitting: *Deleted

## 2017-11-09 ENCOUNTER — Ambulatory Visit (HOSPITAL_COMMUNITY)
Admission: RE | Admit: 2017-11-09 | Discharge: 2017-11-09 | Disposition: A | Discharge status: Home / Self Care | Payer: Medicare Other | Source: Ambulatory Visit | Attending: Podiatry | Admitting: Podiatry

## 2017-11-09 DIAGNOSIS — R609 Edema, unspecified: Secondary | ICD-10-CM

## 2017-11-09 DIAGNOSIS — M25572 Pain in left ankle and joints of left foot: Secondary | ICD-10-CM

## 2017-11-09 NOTE — Telephone Encounter (Signed)
Orders to J. Quintana, RN for pre-cert and faxed to [facility]. 

## 2017-11-09 NOTE — Addendum Note (Signed)
Addended by: Harriett Sine D on: 11/09/2017 01:19 PM   Modules accepted: Orders

## 2017-11-09 NOTE — Telephone Encounter (Signed)
[place], "THIS [place] MEDICAID MEMBER DOES NOT REQUIRE PRIOR AUTHORIZATION FOR OUTPATIENT RADIOLOGY THROUGH [facility] OR [facility] AT THIS TIME." Faxed orders and notification to [facility].

## 2017-11-09 NOTE — Telephone Encounter (Signed)
Pt. Said she needs a referral sent  To another location for MRI. The location that we referred her to is only opened on Saturdays.

## 2017-11-09 NOTE — Telephone Encounter (Signed)
-----   Message from Baxter Flattery sent at 11/09/2017  8:49 AM EDT ----- Regarding: pre cert on stat venous  Crystal Garza , I  have [name] scheduled for today 7/9 @4 :00 . Will you check on your insurance for pre-cert.   Thanks :)

## 2017-11-10 ENCOUNTER — Ambulatory Visit (INDEPENDENT_AMBULATORY_CARE_PROVIDER_SITE_OTHER): Payer: Medicare Other | Admitting: Family Medicine

## 2017-11-10 ENCOUNTER — Encounter: Payer: Self-pay | Admitting: Family Medicine

## 2017-11-10 ENCOUNTER — Telehealth: Payer: Self-pay | Admitting: *Deleted

## 2017-11-10 ENCOUNTER — Other Ambulatory Visit: Payer: Self-pay

## 2017-11-10 VITALS — BP 110/76 | HR 89 | Temp 98.1°F | Ht 67.0 in | Wt 351.2 lb

## 2017-11-10 DIAGNOSIS — Z111 Encounter for screening for respiratory tuberculosis: Secondary | ICD-10-CM | POA: Diagnosis not present

## 2017-11-10 DIAGNOSIS — Z7901 Long term (current) use of anticoagulants: Secondary | ICD-10-CM

## 2017-11-10 NOTE — Telephone Encounter (Signed)
-----   Message from Trula Slade, DPM sent at 11/10/2017  7:16 AM EDT ----- Negative. Val- can you please let her know? Thanks.

## 2017-11-10 NOTE — Telephone Encounter (Signed)
I informed pt of Dr. Wagoner's review of results. 

## 2017-11-10 NOTE — Patient Instructions (Signed)
Best of luck with your surgery. OK to stop your xarelto today. I will send a letter to your surgeon

## 2017-11-10 NOTE — Progress Notes (Signed)
Subjective: 47 year old female presents the office today for concerns of left ankle pain, pointing the lateral aspect is been ongoing for the last couple of days.  She states that she was in the cam boot up until last couple weeks she states that was not helping so she stopped wearing it.  She states that her ankle still painful and has not changed.  Is not gotten any worse either.  Denies any recent injury or falls.  She still reports some swelling to the more the lateral aspect of the ankle.  Denies any redness or warmth. Denies any systemic complaints such as fevers, chills, nausea, vomiting. No acute changes since last appointment, and no other complaints at this time.   Objective: AAO x3, NAD DP/PT pulses palpable bilaterally, CRT less than 3 seconds There is mild discomfort to palpation along the lateral aspect of the ankle on the ATFL.  No discomfort on the PTFL, CFL syndesmosis or deltoid ligaments.  No proximal tib-fib pain.  Today there is tenderness palpation on the course of the peroneal tendon just posterior and inferior to lateral malleolus and proximal to fifth metatarsal base.  Overall the tendon appears to be intact but she has more tenderness to this area identified today than she did last appointment.  There is no erythema associated with swelling.  Achilles tendon intact. No open lesions or pre-ulcerative lesions.  No pain with calf compression, swelling, warmth, erythema  Assessment: 47 year old female left ankle sprain, peroneal tendinitis   Plan: -All treatment options discussed with the patient including all alternatives, risks, complications.  -At this point her symptoms do not improve.  I would not recommend an MRI to rule out a tendon tear.  Although she does not wear the cam boot I recommend doing this until the MRI at 100 at least with ankle brace that she has at home.  Continue ice elevate.  She again presents today wearing flip-flops and I want her to wear more  supportive shoe as well.  She is also concerned about blood clots.  She has no pain in the calf however the swelling to her foot she is concerned.  I ordered a venous duplex.  She has a history of blood clots.  Trula Slade DPM

## 2017-11-11 ENCOUNTER — Encounter: Payer: Self-pay | Admitting: Family Medicine

## 2017-11-12 ENCOUNTER — Ambulatory Visit: Payer: Medicare Other

## 2017-11-12 DIAGNOSIS — Z7901 Long term (current) use of anticoagulants: Secondary | ICD-10-CM | POA: Diagnosis not present

## 2017-11-12 DIAGNOSIS — Z111 Encounter for screening for respiratory tuberculosis: Secondary | ICD-10-CM | POA: Diagnosis not present

## 2017-11-12 NOTE — Assessment & Plan Note (Signed)
Long discussion with her regarding risk-benefit ratio.  Given her upcoming surgery and the type of surgery, I would recommend we discontinue chronic anticoagulation today.  Since she is having a gastric bypass, and she has history of a GI ST tumor, I am not sure if we should restart anticoagulation immediately after surgery.  We discussed and decided to have her follow-up 4 to 8 weeks after surgery and reconsider anticoagulation.  I do think she still high risk with the risk benefit ratio given her upcoming gastric bypass is less clear to me.  I will send her surgeon a letter.  Should we opt to restart anticoagulation, I would consider decreased dose of Xarelto to 10 mg a day or possibly switching her to Eliquis given her history of GI ST tumor.  She is in agreement with this plan.  I spent greater than 50% of our 25-minute office visit in counseling education and coordination of care regarding these issues.

## 2017-11-12 NOTE — Progress Notes (Deleted)
Patient here today to have PPD site read.   PPD read and results entered in [place]. Result: 0 mm induration. Interpretation: Negative Ottis Stain, CMA

## 2017-11-12 NOTE — Progress Notes (Signed)
    CHIEF COMPLAINT / HPI: #1.  Obesity: She has seen her surgeon and they are recommending gastric bypass replaced the gastric sleeve procedure.  She wants to know if she can safely stop anticoagulation.  Needs a letter to her surgeon regarding anticoagulation 2.  Chronic knee pain: Getting a little worse.  She wonders if she is about to need another intervention surgically. 3.  Never did hear back from the plastic surgeon regarding possible breast reduction surgery.  REVIEW OF SYSTEMS: She has had some mild weight gain, some reflux symptoms.  No unusual bleeding.  No chest pain.  No unusual shortness of breath or dyspnea on exertion.  No nocturnal dyspnea.  PERTINENT  PMH / PSH: I have reviewed the patient's medications, allergies, past medical and surgical history, smoking status and updated in the EMR as appropriate.   OBJECTIVE: Vital signs reviewed. GENERAL: Well-developed, well-nourished, no acute distress. CARDIOVASCULAR: Regular rate and rhythm no murmur gallop or rub LUNGS: Clear to auscultation bilaterally, no rales or wheeze. ABDOMEN: Soft positive bowel sounds NEURO: No gross focal neurological deficits. MSK: Movement of extremity x 4.    ASSESSMENT / PLAN:  Chronic anticoagulation Long discussion with her regarding risk-benefit ratio.  Given her upcoming surgery and the type of surgery, I would recommend we discontinue chronic anticoagulation today.  Since she is having a gastric bypass, and she has history of a GI ST tumor, I am not sure if we should restart anticoagulation immediately after surgery.  We discussed and decided to have her follow-up 4 to 8 weeks after surgery and reconsider anticoagulation.  I do think she still high risk with the risk benefit ratio given her upcoming gastric bypass is less clear to me.  I will send her surgeon a letter.  Should we opt to restart anticoagulation, I would consider decreased dose of Xarelto to 10 mg a day or possibly switching  her to Eliquis given her history of GI ST tumor.  She is in agreement with this plan.  I spent greater than 50% of our 25-minute office visit in counseling education and coordination of care regarding these issues.

## 2017-11-14 ENCOUNTER — Other Ambulatory Visit: Payer: Self-pay

## 2017-11-15 ENCOUNTER — Encounter: Payer: Medicare Other | Attending: Surgery | Admitting: Registered"

## 2017-11-15 ENCOUNTER — Ambulatory Visit
Admission: RE | Admit: 2017-11-15 | Discharge: 2017-11-15 | Disposition: A | Discharge status: Home / Self Care | Payer: Medicare Other | Source: Ambulatory Visit | Attending: Podiatry | Admitting: Podiatry

## 2017-11-15 DIAGNOSIS — Z9884 Bariatric surgery status: Secondary | ICD-10-CM | POA: Insufficient documentation

## 2017-11-15 DIAGNOSIS — Z713 Dietary counseling and surveillance: Secondary | ICD-10-CM | POA: Diagnosis not present

## 2017-11-15 DIAGNOSIS — M7662 Achilles tendinitis, left leg: Secondary | ICD-10-CM | POA: Diagnosis not present

## 2017-11-15 DIAGNOSIS — E669 Obesity, unspecified: Secondary | ICD-10-CM

## 2017-11-15 NOTE — Patient Instructions (Signed)
Crystal Garza  11/15/2017   Your procedure is scheduled on: 11/23/2017   Report to [facility] Main  Entrance  Report to admitting at     [facility] AM    Call this number if you have problems the morning of surgery [phone]   Remember: NO SOLID FOOD AFTER MIDNIGHT THE NIGHT PRIOR TO SURGERY. NOTHING BY MOUTH EXCEPT CLEAR LIQUIDS UNTIL 3 HOURS PRIOR TO [place] SURGERY. PLEASE FINISH ENSURE DRINK PER SURGEON ORDER 3 HOURS PRIOR TO SCHEDULED SURGERY TIME WHICH NEEDS TO BE COMPLETED AT __0415am__________.     Take these medicines the morning of surgery with A SIP OF WATER: Xanax if needed, Dexilant, Cardizem                                You may not have any metal on your body including hair pins and              piercings  Do not wear jewelry, make-up, lotions, powders or perfumes, deodorant             Do not wear nail polish.  Do not shave  48 hours prior to surgery.             .   Do not bring valuables to the hospital. [place].  Contacts, dentures or bridgework may not be worn into surgery.  Leave suitcase in the car. After surgery it may be brought to your room.         Special Instructions: coughing and deep breathing exercises, leg exercises               Please read over the following fact sheets you were given: _____________________________________________________________________             [facility] - Preparing for Surgery Before surgery, you can play an important role.  Because skin is not sterile, your skin needs to be as free of germs as possible.  You can reduce the number of germs on your skin by washing with CHG (chlorahexidine gluconate) soap before surgery.  CHG is an antiseptic cleaner which kills germs and bonds with the skin to continue killing germs even after washing. Please DO NOT use if you have an allergy to CHG or antibacterial soaps.  If your skin becomes reddened/irritated  stop using the CHG and inform your nurse when you arrive at Short Stay. Do not shave (including legs and underarms) for at least 48 hours prior to the first CHG shower.  You may shave your face/neck. Please follow these instructions carefully:  1.  Shower with CHG Soap the night before surgery and the  morning of Surgery.  2.  If you choose to wash your hair, wash your hair first as usual with your  normal  shampoo.  3.  After you shampoo, rinse your hair and body thoroughly to remove the  shampoo.                           4.  Use CHG as you would any other liquid soap.  You can apply chg directly  to the skin and wash  Gently with a scrungie or clean washcloth.  5.  Apply the CHG Soap to your body ONLY FROM THE NECK DOWN.   Do not use on face/ open                           Wound or open sores. Avoid contact with eyes, ears mouth and genitals (private parts).                       Wash face,  Genitals (private parts) with your normal soap.             6.  Wash thoroughly, paying special attention to the area where your surgery  will be performed.  7.  Thoroughly rinse your body with warm water from the neck down.  8.  DO NOT shower/wash with your normal soap after using and rinsing off  the CHG Soap.                9.  Pat yourself dry with a clean towel.            10.  Wear clean pajamas.            11.  Place clean sheets on your bed the night of your first shower and do not  sleep with pets. Day of Surgery : Do not apply any lotions/deodorants the morning of surgery.  Please wear clean clothes to the hospital/surgery center.  FAILURE TO FOLLOW THESE INSTRUCTIONS MAY RESULT IN THE CANCELLATION OF YOUR SURGERY PATIENT SIGNATURE_________________________________  NURSE SIGNATURE__________________________________  ________________________________________________________________________  WHAT IS A BLOOD TRANSFUSION? Blood Transfusion Information  A transfusion is the  replacement of blood or some of its parts. Blood is made up of multiple cells which provide different functions.  Red blood cells carry oxygen and are used for blood loss replacement.  White blood cells fight against infection.  Platelets control bleeding.  Plasma helps clot blood.  Other blood products are available for specialized needs, such as hemophilia or other clotting disorders. BEFORE THE TRANSFUSION  Who gives blood for transfusions?   Healthy volunteers who are fully evaluated to make sure their blood is safe. This is blood bank blood. Transfusion therapy is the safest it has ever been in the practice of medicine. Before blood is taken from a donor, a complete history is taken to make sure that person has no history of diseases nor engages in risky social behavior (examples are intravenous drug use or sexual activity with multiple partners). The donor's travel history is screened to minimize risk of transmitting infections, such as malaria. The donated blood is tested for signs of infectious diseases, such as HIV and hepatitis. The blood is then tested to be sure it is compatible with you in order to minimize the chance of a transfusion reaction. If you or a relative donates blood, this is often done in anticipation of surgery and is not appropriate for emergency situations. It takes many days to process the donated blood. RISKS AND COMPLICATIONS Although transfusion therapy is very safe and saves many lives, the main dangers of transfusion include:   Getting an infectious disease.  Developing a transfusion reaction. This is an allergic reaction to something in the blood you were given. Every precaution is taken to prevent this. The decision to have a blood transfusion has been considered carefully by your caregiver before blood is given. Blood is not given unless the benefits outweigh  the risks. AFTER THE TRANSFUSION  Right after receiving a blood transfusion, you will usually  feel much better and more energetic. This is especially true if your red blood cells have gotten low (anemic). The transfusion raises the level of the red blood cells which carry oxygen, and this usually causes an energy increase.  The nurse administering the transfusion will monitor you carefully for complications. HOME CARE INSTRUCTIONS  No special instructions are needed after a transfusion. You may find your energy is better. Speak with your caregiver about any limitations on activity for underlying diseases you may have. SEEK MEDICAL CARE IF:   Your condition is not improving after your transfusion.  You develop redness or irritation at the intravenous (IV) site. SEEK IMMEDIATE MEDICAL CARE IF:  Any of the following symptoms occur over the next 12 hours:  Shaking chills.  You have a temperature by mouth above 102 F (38.9 C), not controlled by medicine.  Chest, back, or muscle pain.  People around you feel you are not acting correctly or are confused.  Shortness of breath or difficulty breathing.  Dizziness and fainting.  You get a rash or develop hives.  You have a decrease in urine output.  Your urine turns a dark color or changes to pink, red, or brown. Any of the following symptoms occur over the next 10 days:  You have a temperature by mouth above 102 F (38.9 C), not controlled by medicine.  Shortness of breath.  Weakness after normal activity.  The white part of the eye turns yellow (jaundice).  You have a decrease in the amount of urine or are urinating less often.  Your urine turns a dark color or changes to pink, red, or brown. Document Released: 04/17/2000 Document Revised: 07/13/2011 Document Reviewed: 12/05/2007 ExitCare Patient Information 2014 [place].  _______________________________________________________________________  Incentive Spirometer  An incentive spirometer is a tool that can help keep your lungs clear and active. This tool  measures how well you are filling your lungs with each breath. Taking long deep breaths may help reverse or decrease the chance of developing breathing (pulmonary) problems (especially infection) following:  A long period of time when you are unable to move or be active. BEFORE THE PROCEDURE   If the spirometer includes an indicator to show your best effort, your nurse or respiratory therapist will set it to a desired goal.  If possible, sit up straight or lean slightly forward. Try not to slouch.  Hold the incentive spirometer in an upright position. INSTRUCTIONS FOR USE  1. Sit on the edge of your bed if possible, or sit up as far as you can in bed or on a chair. 2. Hold the incentive spirometer in an upright position. 3. Breathe out normally. 4. Place the mouthpiece in your mouth and seal your lips tightly around it. 5. Breathe in slowly and as deeply as possible, raising the piston or the ball toward the top of the column. 6. Hold your breath for 3-5 seconds or for as long as possible. Allow the piston or ball to fall to the bottom of the column. 7. Remove the mouthpiece from your mouth and breathe out normally. 8. Rest for a few seconds and repeat Steps 1 through 7 at least 10 times every 1-2 hours when you are awake. Take your time and take a few normal breaths between deep breaths. 9. The spirometer may include an indicator to show your best effort. Use the indicator as a goal to work  toward during each repetition. 10. After each set of 10 deep breaths, practice coughing to be sure your lungs are clear. If you have an incision (the cut made at the time of surgery), support your incision when coughing by placing a pillow or rolled up towels firmly against it. Once you are able to get out of bed, walk around indoors and cough well. You may stop using the incentive spirometer when instructed by your caregiver.  RISKS AND COMPLICATIONS  Take your time so you do not get dizzy or  light-headed.  If you are in pain, you may need to take or ask for pain medication before doing incentive spirometry. It is harder to take a deep breath if you are having pain. AFTER USE  Rest and breathe slowly and easily.  It can be helpful to keep track of a log of your progress. Your caregiver can provide you with a simple table to help with this. If you are using the spirometer at home, follow these instructions: [place] IF:   You are having difficultly using the spirometer.  You have trouble using the spirometer as often as instructed.  Your pain medication is not giving enough relief while using the spirometer.  You develop fever of 100.5 F (38.1 C) or higher. SEEK IMMEDIATE MEDICAL CARE IF:   You cough up bloody sputum that had not been present before.  You develop fever of 102 F (38.9 C) or greater.  You develop worsening pain at or near the incision site. MAKE SURE YOU:   Understand these instructions.  Will watch your condition.  Will get help right away if you are not doing well or get worse. Document Released: 08/31/2006 Document Revised: 07/13/2011 Document Reviewed: 11/01/2006 [facility] Patient Information 2014 [place], [place].   ________________________________________________________________________

## 2017-11-16 ENCOUNTER — Encounter (HOSPITAL_COMMUNITY)
Admission: RE | Admit: 2017-11-16 | Discharge: 2017-11-16 | Disposition: A | Discharge status: Home / Self Care | Payer: Medicare Other | Source: Ambulatory Visit | Attending: Surgery | Admitting: Surgery

## 2017-11-16 ENCOUNTER — Encounter (HOSPITAL_COMMUNITY): Payer: Self-pay

## 2017-11-16 ENCOUNTER — Other Ambulatory Visit: Payer: Self-pay

## 2017-11-16 DIAGNOSIS — Z7901 Long term (current) use of anticoagulants: Secondary | ICD-10-CM | POA: Insufficient documentation

## 2017-11-16 DIAGNOSIS — Z01812 Encounter for preprocedural laboratory examination: Secondary | ICD-10-CM | POA: Insufficient documentation

## 2017-11-16 HISTORY — DX: Personal history of urinary calculi: Z87.442

## 2017-11-16 LAB — CBC WITH DIFFERENTIAL/PLATELET
Basophils Absolute: 0.1 10*3/uL (ref 0.0–0.1)
Basophils Relative: 1 %
Eosinophils Absolute: 0.1 10*3/uL (ref 0.0–0.7)
Eosinophils Relative: 1 %
HCT: 40.2 % (ref 36.0–46.0)
Hemoglobin: 12.4 g/dL (ref 12.0–15.0)
Lymphocytes Relative: 30 %
Lymphs Abs: 3 10*3/uL (ref 0.7–4.0)
MCH: 25.9 pg — ABNORMAL LOW (ref 26.0–34.0)
MCHC: 30.8 g/dL (ref 30.0–36.0)
MCV: 84.1 fL (ref 78.0–100.0)
Monocytes Absolute: 0.7 10*3/uL (ref 0.1–1.0)
Monocytes Relative: 7 %
Neutro Abs: 6.2 10*3/uL (ref 1.7–7.7)
Neutrophils Relative %: 61 %
Platelets: 498 10*3/uL — ABNORMAL HIGH (ref 150–400)
RBC: 4.78 MIL/uL (ref 3.87–5.11)
RDW: 15.1 % (ref 11.5–15.5)
WBC: 10.1 10*3/uL (ref 4.0–10.5)

## 2017-11-16 LAB — COMPREHENSIVE METABOLIC PANEL
ALT: 11 U/L (ref 0–44)
AST: 18 U/L (ref 15–41)
Albumin: 3.3 g/dL — ABNORMAL LOW (ref 3.5–5.0)
Alkaline Phosphatase: 57 U/L (ref 38–126)
Anion gap: 7 (ref 5–15)
BUN: 8 mg/dL (ref 6–20)
CO2: 25 mmol/L (ref 22–32)
Calcium: 9.1 mg/dL (ref 8.9–10.3)
Chloride: 109 mmol/L (ref 98–111)
Creatinine, Ser: 0.85 mg/dL (ref 0.44–1.00)
GFR calc Af Amer: >60 mL/min (ref >60)
GFR calc non Af Amer: >60 mL/min (ref >60)
Glucose, Bld: 84 mg/dL (ref 70–99)
Potassium: 4.2 mmol/L (ref 3.5–5.1)
Sodium: 141 mmol/L (ref 135–145)
Total Bilirubin: 0.2 mg/dL — ABNORMAL LOW (ref 0.3–1.2)
Total Protein: 7.5 g/dL (ref 6.5–8.1)

## 2017-11-16 NOTE — Progress Notes (Signed)
  Pre-Operative Nutrition Class:  Appt start time: 8:15   End time:  9:15.  Patient was seen on 11/15/2017 for Pre-Operative Bariatric Surgery [facility].   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 Patient educated on appropriate usage: Bariatric Advantage Multivitamin Lot # [id number] Exp: 02/2019  Bariatric Advantage Calcium Citrate Lot # [id number] Exp: 05/25/2019  Bariatric Advantage Calcium Citrate Lot # [id number] Exp: 09/02/2018  Premier Protein Shake Lot # [id number] Exp: 08/10/2018   The following the learning objectives were met by the patient during this course:  Identify Pre-Op Dietary Goals and will begin 2 weeks pre-operatively  Identify appropriate sources of fluids and proteins   State protein recommendations and appropriate sources pre and post-operatively  Identify Post-Operative Dietary Goals and will follow for 2 weeks post-operatively  Identify appropriate multivitamin and calcium sources  Describe the need for physical activity post-operatively and will follow MD recommendations  State when to call healthcare provider regarding medication questions or post-operative complications  Handouts given during class include:  Pre-Op Bariatric Surgery Diet Handout  Protein Shake Handout  Post-Op Bariatric Surgery Nutrition Handout  BELT Program Information Flyer  Support Group Information Flyer  [facility] Bariatric Supplements Price List  Follow-Up Plan: Patient will follow-up at [facility] 2 weeks post operatively for diet advancement per MD.

## 2017-11-16 NOTE — Progress Notes (Signed)
ekg 05-25-17 epic  cxr 09-15-17 epic Cath 05-28-17 epic  lov cards 06-08-17 epic

## 2017-11-17 ENCOUNTER — Telehealth: Payer: Self-pay | Admitting: Podiatry

## 2017-11-17 ENCOUNTER — Encounter: Payer: Self-pay | Admitting: Family Medicine

## 2017-11-17 ENCOUNTER — Other Ambulatory Visit: Payer: Self-pay | Admitting: Family Medicine

## 2017-11-17 ENCOUNTER — Encounter: Payer: Self-pay | Admitting: Podiatry

## 2017-11-17 NOTE — Telephone Encounter (Signed)
Attempted to call patient to discuss MRI results. Also saw that she sent a message to get an appointment due to swelling. Left VM to call back.   Celesta Gentile, DPM

## 2017-11-18 ENCOUNTER — Telehealth: Payer: Self-pay | Admitting: *Deleted

## 2017-11-18 ENCOUNTER — Telehealth: Payer: Self-pay | Admitting: Podiatry

## 2017-11-18 ENCOUNTER — Other Ambulatory Visit: Payer: Self-pay | Admitting: Family Medicine

## 2017-11-18 MED ORDER — OXYCODONE-ACETAMINOPHEN 10-325 MG PO TABS: ORAL_TABLET | ORAL | 0 refills | Status: DC

## 2017-11-18 NOTE — Telephone Encounter (Signed)
Val- can you please let her know that her MRI did only show a little Achilles inflammation but otherwise the MRI was normal. If she is having swelling and pain I would put her into an unna boot and CAM boot to immobilize it and help with the swelling. If she cannot wait to come in until I get back next week please have her see one of the other doctors.

## 2017-11-18 NOTE — Telephone Encounter (Signed)
I informed pt of Dr. Leigh Aurora review of results and orders. Pt states she is in a lot of pain and the swelling is not going down. I told pt to stay in the cam boot and I would transfer her to the schedulers to get in tomorrow or 1st of next week to be seen. Pt asked if the unna boot would be covered by insurance and I told her it is a medicated boot to help the swelling and should be covered, we would fill with the insurance and what is not covered we would bill her. Pt states undestanding and I transferred to schedulers.

## 2017-11-18 NOTE — Telephone Encounter (Signed)
Pt calls to let us know that [facility] never received our letter created on 11/11/17.  Will refax now. [place], Salome Spotted, CMA

## 2017-11-18 NOTE — Progress Notes (Signed)
I sent her a reply via My Chart  I sent the pain meds in It looks like there are still 3 refills on the muscle relaxer---let me know if the pharmacy has that differently. THANKS! [name]

## 2017-11-18 NOTE — Telephone Encounter (Signed)
Pt was returning your call.

## 2017-11-20 DIAGNOSIS — L02211 Cutaneous abscess of abdominal wall: Secondary | ICD-10-CM | POA: Diagnosis not present

## 2017-11-22 ENCOUNTER — Ambulatory Visit (INDEPENDENT_AMBULATORY_CARE_PROVIDER_SITE_OTHER): Payer: Medicare Other

## 2017-11-22 ENCOUNTER — Other Ambulatory Visit: Payer: Self-pay | Admitting: Podiatry

## 2017-11-22 ENCOUNTER — Inpatient Hospital Stay (HOSPITAL_COMMUNITY): Payer: Medicare Other | Admitting: Certified Registered"

## 2017-11-22 ENCOUNTER — Encounter: Payer: Self-pay | Admitting: Podiatry

## 2017-11-22 ENCOUNTER — Ambulatory Visit (INDEPENDENT_AMBULATORY_CARE_PROVIDER_SITE_OTHER): Payer: Medicare Other | Admitting: Podiatry

## 2017-11-22 DIAGNOSIS — S99921A Unspecified injury of right foot, initial encounter: Secondary | ICD-10-CM

## 2017-11-22 DIAGNOSIS — M779 Enthesopathy, unspecified: Secondary | ICD-10-CM | POA: Diagnosis not present

## 2017-11-22 DIAGNOSIS — M79672 Pain in left foot: Secondary | ICD-10-CM

## 2017-11-22 MED ORDER — BUPIVACAINE LIPOSOME 1.3 % IJ SUSP
20.0000 mL | Freq: Once | INTRAMUSCULAR | Status: DC
  Filled 2017-11-22: qty 20

## 2017-11-22 MED ORDER — TRIAMCINOLONE ACETONIDE 10 MG/ML IJ SUSP
10.0000 mg | Freq: Once | INTRAMUSCULAR | Status: AC
  Administered 2017-11-22: 10 mg

## 2017-11-22 NOTE — H&P (View-Only) (Signed)
Chief Complaint:  Refractory GER with sleeve gastretomy  History of Present Illness:  Crystal Garza is an 47 y.o. female who underwent laparoscopy for sleeve in October 2013 at which time a mass was found on the proximal stomach and this was biopsied.  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  In April 2014 she underwent a lap sleeve gastrectomy.  She has had refractory GERD since then and on UGI has a small hiatal hernia with free reflux.  Plan to convert to roux en Y gastric bypass  She is aware of the risks and benefits of the procedure.    Past Medical History:  Diagnosis Date  . Anemia    iron def  . Anginal pain ([place])   . Anxiety   . Arthritis    left knee   . Back pain   . Benign gastrointestinal stromal tumor (GIST)   . Chronic knee pain   . Depression   . DVT (deep venous thrombosis) ([place])   . GERD (gastroesophageal reflux disease)   . History of kidney stones   . Hx of laparoscopic gastric banding   . Hypertension   . Migraine   . Morbid obesity ([place])   . PE (pulmonary embolism)     Past Surgical History:  Procedure Laterality Date  . [place] STUDY N/A 05/13/2016   Procedure: [place] STUDY;  Surgeon: Manus Gunning, MD;  [facility];  Service: Gastroenterology;  Laterality: N/A;  . ABDOMINAL HYSTERECTOMY    . BIOPSY  02/16/2012   Procedure: BIOPSY;  Surgeon: [name], MD;  [facility];  Service: General;;  biopsy of mass x 2  . BREATH TEK H PYLORI  07/28/2011   Procedure: [place];  Surgeon: [name], MD;  Location: [facility];  Service: General;  Laterality: N/A;  to be done at 745  . CESAREAN SECTION  05/1991  . COLONOSCOPY WITH PROPOFOL N/A 03/03/2016   Procedure: COLONOSCOPY WITH PROPOFOL;  Surgeon: Manus Gunning, MD;  [facility];  Service: Gastroenterology;  Laterality: N/A;  . ESOPHAGEAL MANOMETRY N/A 05/13/2016   Procedure: ESOPHAGEAL MANOMETRY (EM);  Surgeon: Manus Gunning, MD;  [facility];  Service: Gastroenterology;  Laterality: N/A;  . ESOPHAGOGASTRODUODENOSCOPY N/A 08/29/2012   Procedure: ESOPHAGOGASTRODUODENOSCOPY (EGD);  Surgeon: [name], MD;  [facility];  Service: General;  Laterality: N/A;  . ESOPHAGOGASTRODUODENOSCOPY N/A 09/04/2014   Procedure: ESOPHAGOGASTRODUODENOSCOPY (EGD);  Surgeon: Alphonsa Overall, MD;  Location: [facility];  Service: General;  Laterality: N/A;  . ESOPHAGOGASTRODUODENOSCOPY (EGD) WITH PROPOFOL N/A 03/03/2016   Procedure: ESOPHAGOGASTRODUODENOSCOPY (EGD) WITH PROPOFOL;  Surgeon: Manus Gunning, MD;  [facility];  Service: Gastroenterology;  Laterality: N/A;  . EUS  03/03/2012   Procedure: UPPER ENDOSCOPIC ULTRASOUND (EUS) LINEAR;  Surgeon: Milus Banister, MD;  [facility];  Service: Endoscopy;  Laterality: N/A;  . gist    . KNEE ARTHROSCOPY    . LAPAROSCOPIC GASTRECTOMY  04/08/2012   Procedure: LAPAROSCOPIC GASTRECTOMY;  Surgeon: [name], MD;  [facility];  Service: General;;  removal of GIST tumor of stomach  . LAPAROSCOPIC GASTRIC SLEEVE RESECTION N/A 08/29/2012   Procedure: LAPAROSCOPIC GASTRIC SLEEVE RESECTION;  Surgeon: [name], MD;  [facility];  Service: General;  Laterality: N/A;  Sleeve Gastrectomy  . LEFT HEART CATH AND CORONARY ANGIOGRAPHY N/A 05/28/2017   Procedure: LEFT HEART CATH AND CORONARY ANGIOGRAPHY;  Surgeon:  Leonie Man, MD;  Location: [facility];  Service: Cardiovascular;  Laterality: N/A;  . ROUX-EN-Y GASTRIC BYPASS    . TOTAL KNEE ARTHROPLASTY Left 10/06/2013   Procedure: LEFT TOTAL KNEE ARTHROPLASTY;  Surgeon: Mcarthur Rossetti, MD;  [facility];  Service: Orthopedics;  Laterality: Left;  . TUBAL LIGATION      Current Facility-Administered Medications  Medication Dose Route Frequency Provider Last Rate Last Dose  . [START ON 11/23/2017] bupivacaine liposome (EXPAREL) 1.3 % injection 266 mg  20 mL  Infiltration Once Johnathan Hausen, MD       Current Outpatient Medications  Medication Sig Dispense Refill  . ALPRAZolam (XANAX) 1 MG tablet TAKE (1) TABLET TWICE A DAY AS NEEDED. (Patient taking differently: Take 1 mg by mouth twice daily as needed for anxiety) 60 tablet 5  . desipramine (NORPRAMIN) 10 MG tablet Take 2 tablets (20 mg total) by mouth at bedtime. 60 tablet 3  . DEXILANT 60 MG capsule TAKE (1) CAPSULE DAILY. 90 capsule 3  . diltiazem (CARDIZEM CD) 120 MG 24 hr capsule Take 1 capsule (120 mg total) by mouth daily. 90 capsule 1  . lamoTRIgine (LAMICTAL) 200 MG tablet Take 1 tablet (200 mg total) by mouth at bedtime. 90 tablet 3  . lisinopril (PRINIVIL,ZESTRIL) 10 MG tablet Take 1 tablet (10 mg total) by mouth daily. 90 tablet 3  . ondansetron (ZOFRAN-ODT) 4 MG disintegrating tablet Take 4 mg by mouth every 8 (eight) hours as needed for nausea or vomiting.     . risperiDONE (RISPERDAL) 1 MG tablet Take 1 tablet (1 mg total) by mouth at bedtime. 90 tablet 3  . tiZANidine (ZANAFLEX) 4 MG tablet Take 1 tablet (4 mg total) by mouth 2 (two) times daily as needed for muscle spasms. 60 tablet 3  . AMBULATORY NON FORMULARY MEDICATION Medication Name: FDgard: use as directed for dyspepsia (Patient not taking: Reported on 11/12/2017)    . aspirin EC 81 MG tablet Take 2 tablets (162 mg total) by mouth daily. (Patient not taking: Reported on 11/12/2017) 90 tablet 3  . oxyCODONE-acetaminophen (PERCOCET) 10-325 MG tablet Take one by mouth four times a day as directed for chronic neck and leg pain 30 day supply do not fill before November 19 2017 120 tablet 0  . XARELTO 20 MG TABS tablet TAKE 1 TABLET WITH SUPPER 90 tablet 3   [place] Family History  Problem Relation Age of Onset  . Diabetes Mother   . Diabetes Father   . Heart disease Father   . Colon cancer Father        brain tumor  . Colon polyps Father   . Colitis Father   . Irritable bowel syndrome Father   . Diabetes Maternal Grandmother    . Diabetes Maternal Grandfather   . Schizophrenia Maternal Grandfather   . Heart disease Paternal Uncle   . Diabetes Paternal Uncle   . Heart disease Paternal Grandmother   . Heart disease Paternal Grandfather   . Diabetes Maternal Aunt   . Diabetes Maternal Uncle   . Diabetes Paternal Aunt   . Cancer Other   . Hypertension Other    Social History:   reports that she has never smoked. She has never used smokeless tobacco. She reports that she does not drink alcohol or use drugs.   REVIEW OF SYSTEMS : Negative except for see problem list  Physical Exam:   There were no vitals taken for this visit. There is no height or  weight on file to calculate BMI.  Gen:  WDWN AAF NAD  Neurological: Alert and oriented to person, place, and time. Motor and sensory function is grossly intact  Head: Normocephalic and atraumatic.  Eyes: Conjunctivae are normal. Pupils are equal, round, and reactive to light. No scleral icterus.  Neck: Normal range of motion. Neck supple. No tracheal deviation or thyromegaly present.  Cardiovascular:  SR without murmurs or gallops.  No carotid bruits Breast:  Not examined Respiratory: Effort normal.  No respiratory distress. No chest wall tenderness. Breath sounds normal.  No wheezes, rales or rhonchi.  Abdomen:  Obese with multiple small scars from prior surgery GU:  Not evaluated Musculoskeletal: Normal range of motion. Extremities are nontender. No cyanosis, edema or clubbing noted Lymphadenopathy: No cervical, preauricular, postauricular or axillary adenopathy is present Skin: Skin is warm and dry. No rash noted. No diaphoresis. No erythema. No pallor. Pscyh: Normal mood and affect. Behavior is normal. Judgment and thought content normal.   LABORATORY RESULTS: No results found for this or any previous visit (from the past 48 hour(s)).   RADIOLOGY RESULTS: Dg Foot 2 Views Left  Result Date: 11/22/2017 Please see detailed radiograph report in office  note.  Dg Foot 2 Views Right  Result Date: 11/22/2017 Please see detailed radiograph report in office note.   Problem List: Patient Active Problem List   Diagnosis Date Noted  . History of pulmonary embolism 05/25/2017  . Angina, class III ([place]) 05/25/2017  . Symptoms of gastroesophageal reflux 11/09/2016  . Esophageal dysphagia   . Nausea without vomiting   . Family history of colon cancer   . Dizziness 11/08/2015  . 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 Chronic neck and back pain 02/27/2015  . Other chest pain 12/10/2014  . Medication management 08/22/2014  . Hematuria 08/22/2014  . Pain in joint, ankle and foot 05/23/2014  . Chronic anticoagulation 05/17/2014  . Chronic thoracic back pain 04/11/2014  . Right flank pain 01/17/2014  . Degenerative arthritis of left knee 10/06/2013  . Status post total knee replacement 10/06/2013  . Edema 07/28/2013  . Chronic pain 06/13/2013  . 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 Gastrointestinal stromal tumor (GIST) of stomach ([place]) 02/17/2012  . Bipolar disorder ([place]) 01/19/2012  . Nephrolithiasis 02/20/2011  . Eczematous dermatitis 10/22/2010  . Iron deficiency anemia 06/27/2010  . Anxiety 06/27/2010  . Migraine 09/12/2009  . Morbid obesity-BMI 62 10/30/2008  . Osteoarthrosis, unspecified whether generalized or localized, involving lower leg 07/05/2007  . DEPRESSION, MAJOR, RECURRENT 07/01/2006  . HYPERTENSION, BENIGN SYSTEMIC 07/01/2006  . RHINITIS, ALLERGIC 07/01/2006  . Gastroesophageal reflux disease 07/01/2006    Assessment & Plan: Prior sleeve gastrectomy with refractory GERD for conversion to roux en Y gastric bypass    Matt B. Hassell Done, MD, [facility] [phone] beeper [phone]  11/22/2017 12:50 PM

## 2017-11-22 NOTE — H&P (Signed)
Chief Complaint:  Refractory GER with sleeve gastretomy  History of Present Illness:  Crystal Garza is an 47 y.o. female who underwent laparoscopy for sleeve in October 2013 at which time a mass was found on the proximal stomach and this was biopsied.  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  In April 2014 she underwent a lap sleeve gastrectomy.  She has had refractory GERD since then and on UGI has a small hiatal hernia with free reflux.  Plan to convert to roux en Y gastric bypass  She is aware of the risks and benefits of the procedure.    Past Medical History:  Diagnosis Date  . Anemia    iron def  . Anginal pain ([place])   . Anxiety   . Arthritis    left knee   . Back pain   . Benign gastrointestinal stromal tumor (GIST)   . Chronic knee pain   . Depression   . DVT (deep venous thrombosis) ([place])   . GERD (gastroesophageal reflux disease)   . History of kidney stones   . Hx of laparoscopic gastric banding   . Hypertension   . Migraine   . Morbid obesity ([place])   . PE (pulmonary embolism)     Past Surgical History:  Procedure Laterality Date  . [place] STUDY N/A 05/13/2016   Procedure: [place] STUDY;  Surgeon: Manus Gunning, MD;  [facility];  Service: Gastroenterology;  Laterality: N/A;  . ABDOMINAL HYSTERECTOMY    . BIOPSY  02/16/2012   Procedure: BIOPSY;  Surgeon: [name], MD;  [facility];  Service: General;;  biopsy of mass x 2  . BREATH TEK H PYLORI  07/28/2011   Procedure: [place];  Surgeon: [name], MD;  Location: [facility];  Service: General;  Laterality: N/A;  to be done at 745  . CESAREAN SECTION  05/1991  . COLONOSCOPY WITH PROPOFOL N/A 03/03/2016   Procedure: COLONOSCOPY WITH PROPOFOL;  Surgeon: Manus Gunning, MD;  [facility];  Service: Gastroenterology;  Laterality: N/A;  . ESOPHAGEAL MANOMETRY N/A 05/13/2016   Procedure: ESOPHAGEAL MANOMETRY (EM);  Surgeon: Manus Gunning, MD;  [facility];  Service: Gastroenterology;  Laterality: N/A;  . ESOPHAGOGASTRODUODENOSCOPY N/A 08/29/2012   Procedure: ESOPHAGOGASTRODUODENOSCOPY (EGD);  Surgeon: [name], MD;  [facility];  Service: General;  Laterality: N/A;  . ESOPHAGOGASTRODUODENOSCOPY N/A 09/04/2014   Procedure: ESOPHAGOGASTRODUODENOSCOPY (EGD);  Surgeon: Alphonsa Overall, MD;  Location[facility];  Service: General;  Laterality: N/A;  . ESOPHAGOGASTRODUODENOSCOPY (EGD) WITH PROPOFOL N/A 03/03/2016   Procedure: ESOPHAGOGASTRODUODENOSCOPY (EGD) WITH PROPOFOL;  Surgeon: Manus Gunning, MD;  [facility];  Service: Gastroenterology;  Laterality: N/A;  . EUS  03/03/2012   Procedure: UPPER ENDOSCOPIC ULTRASOUND (EUS) LINEAR;  Surgeon: Milus Banister, MD;  [facility];  Service: Endoscopy;  Laterality: N/A;  . gist    . KNEE ARTHROSCOPY    . LAPAROSCOPIC GASTRECTOMY  04/08/2012   Procedure: LAPAROSCOPIC GASTRECTOMY;  Surgeon: [name], MD;  [facility];  Service: General;;  removal of GIST tumor of stomach  . LAPAROSCOPIC GASTRIC SLEEVE RESECTION N/A 08/29/2012   Procedure: LAPAROSCOPIC GASTRIC SLEEVE RESECTION;  Surgeon: [name], MD;  [facility];  Service: General;  Laterality: N/A;  Sleeve Gastrectomy  . LEFT HEART CATH AND CORONARY ANGIOGRAPHY N/A 05/28/2017   Procedure: LEFT HEART CATH AND CORONARY ANGIOGRAPHY;  Surgeon:  Leonie Man, MD;  Location: [facility];  Service: Cardiovascular;  Laterality: N/A;  . ROUX-EN-Y GASTRIC BYPASS    . TOTAL KNEE ARTHROPLASTY Left 10/06/2013   Procedure: LEFT TOTAL KNEE ARTHROPLASTY;  Surgeon: Mcarthur Rossetti, MD;  [facility];  Service: Orthopedics;  Laterality: Left;  . TUBAL LIGATION      Current Facility-Administered Medications  Medication Dose Route Frequency Provider Last Rate Last Dose  . [START ON 11/23/2017] bupivacaine liposome (EXPAREL) 1.3 % injection 266 mg  20 mL  Infiltration Once Johnathan Hausen, MD       Current Outpatient Medications  Medication Sig Dispense Refill  . ALPRAZolam (XANAX) 1 MG tablet TAKE (1) TABLET TWICE A DAY AS NEEDED. (Patient taking differently: Take 1 mg by mouth twice daily as needed for anxiety) 60 tablet 5  . desipramine (NORPRAMIN) 10 MG tablet Take 2 tablets (20 mg total) by mouth at bedtime. 60 tablet 3  . DEXILANT 60 MG capsule TAKE (1) CAPSULE DAILY. 90 capsule 3  . diltiazem (CARDIZEM CD) 120 MG 24 hr capsule Take 1 capsule (120 mg total) by mouth daily. 90 capsule 1  . lamoTRIgine (LAMICTAL) 200 MG tablet Take 1 tablet (200 mg total) by mouth at bedtime. 90 tablet 3  . lisinopril (PRINIVIL,ZESTRIL) 10 MG tablet Take 1 tablet (10 mg total) by mouth daily. 90 tablet 3  . ondansetron (ZOFRAN-ODT) 4 MG disintegrating tablet Take 4 mg by mouth every 8 (eight) hours as needed for nausea or vomiting.     . risperiDONE (RISPERDAL) 1 MG tablet Take 1 tablet (1 mg total) by mouth at bedtime. 90 tablet 3  . tiZANidine (ZANAFLEX) 4 MG tablet Take 1 tablet (4 mg total) by mouth 2 (two) times daily as needed for muscle spasms. 60 tablet 3  . AMBULATORY NON FORMULARY MEDICATION Medication Name: FDgard: use as directed for dyspepsia (Patient not taking: Reported on 11/12/2017)    . aspirin EC 81 MG tablet Take 2 tablets (162 mg total) by mouth daily. (Patient not taking: Reported on 11/12/2017) 90 tablet 3  . oxyCODONE-acetaminophen (PERCOCET) 10-325 MG tablet Take one by mouth four times a day as directed for chronic neck and leg pain 30 day supply do not fill before November 19 2017 120 tablet 0  . XARELTO 20 MG TABS tablet TAKE 1 TABLET WITH SUPPER 90 tablet 3   [place] Family History  Problem Relation Age of Onset  . Diabetes Mother   . Diabetes Father   . Heart disease Father   . Colon cancer Father        brain tumor  . Colon polyps Father   . Colitis Father   . Irritable bowel syndrome Father   . Diabetes Maternal Grandmother    . Diabetes Maternal Grandfather   . Schizophrenia Maternal Grandfather   . Heart disease Paternal Uncle   . Diabetes Paternal Uncle   . Heart disease Paternal Grandmother   . Heart disease Paternal Grandfather   . Diabetes Maternal Aunt   . Diabetes Maternal Uncle   . Diabetes Paternal Aunt   . Cancer Other   . Hypertension Other    Social History:   reports that she has never smoked. She has never used smokeless tobacco. She reports that she does not drink alcohol or use drugs.   REVIEW OF SYSTEMS : Negative except for see problem list  Physical Exam:   There were no vitals taken for this visit. There is no height or  weight on file to calculate BMI.  Gen:  WDWN AAF NAD  Neurological: Alert and oriented to person, place, and time. Motor and sensory function is grossly intact  Head: Normocephalic and atraumatic.  Eyes: Conjunctivae are normal. Pupils are equal, round, and reactive to light. No scleral icterus.  Neck: Normal range of motion. Neck supple. No tracheal deviation or thyromegaly present.  Cardiovascular:  SR without murmurs or gallops.  No carotid bruits Breast:  Not examined Respiratory: Effort normal.  No respiratory distress. No chest wall tenderness. Breath sounds normal.  No wheezes, rales or rhonchi.  Abdomen:  Obese with multiple small scars from prior surgery GU:  Not evaluated Musculoskeletal: Normal range of motion. Extremities are nontender. No cyanosis, edema or clubbing noted Lymphadenopathy: No cervical, preauricular, postauricular or axillary adenopathy is present Skin: Skin is warm and dry. No rash noted. No diaphoresis. No erythema. No pallor. Pscyh: Normal mood and affect. Behavior is normal. Judgment and thought content normal.   LABORATORY RESULTS: No results found for this or any previous visit (from the past 48 hour(s)).   RADIOLOGY RESULTS: Dg Foot 2 Views Left  Result Date: 11/22/2017 Please see detailed radiograph report in office  note.  Dg Foot 2 Views Right  Result Date: 11/22/2017 Please see detailed radiograph report in office note.   Problem List: Patient Active Problem List   Diagnosis Date Noted  . History of pulmonary embolism 05/25/2017  . Angina, class III ([place]) 05/25/2017  . Symptoms of gastroesophageal reflux 11/09/2016  . Esophageal dysphagia   . Nausea without vomiting   . Family history of colon cancer   . Dizziness 11/08/2015  . 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 Chronic neck and back pain 02/27/2015  . Other chest pain 12/10/2014  . Medication management 08/22/2014  . Hematuria 08/22/2014  . Pain in joint, ankle and foot 05/23/2014  . Chronic anticoagulation 05/17/2014  . Chronic thoracic back pain 04/11/2014  . Right flank pain 01/17/2014  . Degenerative arthritis of left knee 10/06/2013  . Status post total knee replacement 10/06/2013  . Edema 07/28/2013  . Chronic pain 06/13/2013  . 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 Gastrointestinal stromal tumor (GIST) of stomach ([place]) 02/17/2012  . Bipolar disorder ([place]) 01/19/2012  . Nephrolithiasis 02/20/2011  . Eczematous dermatitis 10/22/2010  . Iron deficiency anemia 06/27/2010  . Anxiety 06/27/2010  . Migraine 09/12/2009  . Morbid obesity-BMI 62 10/30/2008  . Osteoarthrosis, unspecified whether generalized or localized, involving lower leg 07/05/2007  . DEPRESSION, MAJOR, RECURRENT 07/01/2006  . HYPERTENSION, BENIGN SYSTEMIC 07/01/2006  . RHINITIS, ALLERGIC 07/01/2006  . Gastroesophageal reflux disease 07/01/2006    Assessment & Plan: Prior sleeve gastrectomy with refractory GERD for conversion to roux en Y gastric bypass    Matt B. Hassell Done, MD, [facility] [phone] beeper [phone]  11/22/2017 12:50 PM

## 2017-11-22 NOTE — Progress Notes (Signed)
Subjective:   Patient ID: Crystal Garza, female   DOB: 47 y.o.   MRN: [id number]   HPI Patient states she is having a lot of pain in her left ankle and she did traumatize her right ankle and she was concerned about fracture.  Patient's had previous MRI done and vein testing which I will review with her at this time  ROS      Objective:  Physical Exam  Neurovascular status was intact negative Homans sign noted with diminishment range of motion subtalar joint bilateral which appears to be more splinting to discomfort.  There is edema in the right ankle mild in nature nonpitting negative Homans sign was noted and on the left I noted quite a bit of pain in the sinus tarsi     Assessment:  Traumatized right ankle with possibility for bone injury and sinus tarsitis left with other problems with negative MRI at the current time     Plan:  Reviewed MRI which did not indicate any tear of tendons on the left so I went ahead today and I injected the sinus tarsi 3 mg Kenalog 5 mg Xylocaine and applied fascial brace which she says seems to help the pain she was experiencing.  For the right I reviewed her x-ray and I advised on compression therapy elevation and she will be seen back in the next 2 to 3 weeks or earlier if needed  X-ray was negative for signs of fracture of the right ankle with no indication of diastases injury

## 2017-11-23 ENCOUNTER — Encounter (HOSPITAL_COMMUNITY)
Admission: RE | Disposition: A | Discharge status: Home / Self Care | Payer: Self-pay | Source: Ambulatory Visit | Attending: Surgery

## 2017-11-23 ENCOUNTER — Encounter (HOSPITAL_COMMUNITY): Payer: Self-pay | Admitting: Emergency Medicine

## 2017-11-23 ENCOUNTER — Ambulatory Visit (HOSPITAL_COMMUNITY)
Admission: RE | Admit: 2017-11-23 | Discharge: 2017-11-23 | Disposition: A | Discharge status: Home / Self Care | Payer: Medicare Other | Source: Ambulatory Visit | Attending: Surgery | Admitting: Surgery

## 2017-11-23 ENCOUNTER — Telehealth: Payer: Self-pay | Admitting: *Deleted

## 2017-11-23 DIAGNOSIS — Z538 Procedure and treatment not carried out for other reasons: Secondary | ICD-10-CM | POA: Diagnosis not present

## 2017-11-23 DIAGNOSIS — Z79899 Other long term (current) drug therapy: Secondary | ICD-10-CM | POA: Diagnosis not present

## 2017-11-23 DIAGNOSIS — Z7982 Long term (current) use of aspirin: Secondary | ICD-10-CM | POA: Diagnosis not present

## 2017-11-23 DIAGNOSIS — Z6841 Body Mass Index (BMI) 40.0 and over, adult: Secondary | ICD-10-CM | POA: Insufficient documentation

## 2017-11-23 DIAGNOSIS — Z7901 Long term (current) use of anticoagulants: Secondary | ICD-10-CM | POA: Insufficient documentation

## 2017-11-23 DIAGNOSIS — Z86711 Personal history of pulmonary embolism: Secondary | ICD-10-CM | POA: Diagnosis not present

## 2017-11-23 DIAGNOSIS — Z86718 Personal history of other venous thrombosis and embolism: Secondary | ICD-10-CM | POA: Insufficient documentation

## 2017-11-23 DIAGNOSIS — K219 Gastro-esophageal reflux disease without esophagitis: Secondary | ICD-10-CM | POA: Insufficient documentation

## 2017-11-23 DIAGNOSIS — L02211 Cutaneous abscess of abdominal wall: Secondary | ICD-10-CM | POA: Insufficient documentation

## 2017-11-23 DIAGNOSIS — F419 Anxiety disorder, unspecified: Secondary | ICD-10-CM | POA: Diagnosis not present

## 2017-11-23 LAB — TYPE AND SCREEN
ABO/RH(D): B POS
Antibody Screen: NEGATIVE

## 2017-11-23 LAB — PREGNANCY, URINE: Preg Test, Ur: NEGATIVE

## 2017-11-23 SURGERY — LAPAROSCOPIC ROUX-EN-Y GASTRIC BYPASS WITH UPPER ENDOSCOPY
Anesthesia: General

## 2017-11-23 MED ORDER — DEXAMETHASONE SODIUM PHOSPHATE 4 MG/ML IJ SOLN: 4.0000 mg | INTRAMUSCULAR | Status: DC

## 2017-11-23 MED ORDER — ONDANSETRON HCL 4 MG/2ML IJ SOLN
INTRAMUSCULAR | Status: AC
  Filled 2017-11-23: qty 2

## 2017-11-23 MED ORDER — DEXAMETHASONE SODIUM PHOSPHATE 10 MG/ML IJ SOLN
INTRAMUSCULAR | Status: AC
  Filled 2017-11-23: qty 1

## 2017-11-23 MED ORDER — KETAMINE HCL 10 MG/ML IJ SOLN
INTRAMUSCULAR | Status: AC
Start: 2017-11-23 — End: ?
  Filled 2017-11-23: qty 1

## 2017-11-23 MED ORDER — MIDAZOLAM HCL 2 MG/2ML IJ SOLN
INTRAMUSCULAR | Status: AC
  Filled 2017-11-23: qty 2

## 2017-11-23 MED ORDER — FENTANYL CITRATE (PF) 100 MCG/2ML IJ SOLN
INTRAMUSCULAR | Status: AC
  Filled 2017-11-23: qty 2

## 2017-11-23 MED ORDER — LIDOCAINE 2% (20 MG/ML) 5 ML SYRINGE
INTRAMUSCULAR | Status: AC
  Filled 2017-11-23: qty 5

## 2017-11-23 MED ORDER — HEPARIN SODIUM (PORCINE) 5000 UNIT/ML IJ SOLN
5000.0000 [IU] | INTRAMUSCULAR | Status: AC
  Administered 2017-11-23: 5000 [IU] via SUBCUTANEOUS
  Filled 2017-11-23: qty 1

## 2017-11-23 MED ORDER — APREPITANT 40 MG PO CAPS
40.0000 mg | ORAL_CAPSULE | ORAL | Status: AC
  Administered 2017-11-23: 40 mg via ORAL
  Filled 2017-11-23: qty 1

## 2017-11-23 MED ORDER — PROPOFOL 10 MG/ML IV BOLUS
INTRAVENOUS | Status: AC
  Filled 2017-11-23: qty 20

## 2017-11-23 MED ORDER — CELECOXIB 200 MG PO CAPS
400.0000 mg | ORAL_CAPSULE | ORAL | Status: AC
  Administered 2017-11-23: 400 mg via ORAL
  Filled 2017-11-23: qty 2

## 2017-11-23 MED ORDER — CHLORHEXIDINE GLUCONATE CLOTH 2 % EX PADS: 6.0000 | MEDICATED_PAD | Freq: Once | CUTANEOUS | Status: DC

## 2017-11-23 MED ORDER — SCOPOLAMINE 1 MG/3DAYS TD PT72
1.0000 | MEDICATED_PATCH | TRANSDERMAL | Status: DC
  Administered 2017-11-23: 1.5 mg via TRANSDERMAL
  Filled 2017-11-23: qty 1

## 2017-11-23 MED ORDER — GABAPENTIN 300 MG PO CAPS
300.0000 mg | ORAL_CAPSULE | ORAL | Status: AC
  Administered 2017-11-23: 300 mg via ORAL
  Filled 2017-11-23: qty 1

## 2017-11-23 MED ORDER — ROCURONIUM BROMIDE 10 MG/ML (PF) SYRINGE
PREFILLED_SYRINGE | INTRAVENOUS | Status: AC
  Filled 2017-11-23: qty 10

## 2017-11-23 MED ORDER — SODIUM CHLORIDE 0.9 % IV SOLN: 2.0000 g | INTRAVENOUS | Status: DC

## 2017-11-23 NOTE — Telephone Encounter (Signed)
Pt calls because she has an abscess on her stomach, was seen @ [facility] and given abx.  She was scheduled for gastric bypass this AM, but it was canceled because of abscess and was told to call PCP.  Pt is not running a fever but the area is still red.  Offered overflow but pt declines because she does not want to sit and wait.  Made appt with Dr. Erin Hearing for 11:30 am. [place], [place], [place]

## 2017-11-23 NOTE — Progress Notes (Signed)
Pt given new bandaid to put on abscess, scopalomine patch removed. IV was never initiated.  Pt was told by Dr. Hassell Done to call office in a couple weeks to reschedule and to continue taking antibiotics.  Pt states she is going to call primary care doctor today as soon as they open to get an appointment to reassess abscess and to see if any other antibiotics need to be started. Pt told to seek emergency care if she experiences cp/sob, fever/chills, increased redness/swelling, odor, increased drainage. Pt verbalizes understanding of instructions and is ambulatory to main entrance at discharge.  Mother carolyn is driving pt home today. Pt leaving in stable condition, no distress.

## 2017-11-23 NOTE — Progress Notes (Signed)
Dr. Hassell Done notified pt and staff that surgery would be cancelled today due to abscess on abdomen.  Pt getting dressed at this time.

## 2017-11-23 NOTE — Anesthesia Preprocedure Evaluation (Signed)
Anesthesia Evaluation  Patient identified by MRN, date of birth, ID band Patient awake    Reviewed: Allergy & Precautions, NPO status , Patient's Chart, lab work & pertinent test results  Airway Mallampati: II  TM Distance: >3 FB Neck ROM: Full    Dental no notable dental hx.    Pulmonary neg pulmonary ROS,    Pulmonary exam normal breath sounds clear to auscultation       Cardiovascular hypertension, + DVT  Normal cardiovascular exam Rhythm:Regular Rate:Normal     Neuro/Psych Bipolar Disorder negative neurological ROS     GI/Hepatic negative GI ROS, Neg liver ROS,   Endo/Other  Morbid obesity  Renal/GU negative Renal ROS  negative genitourinary   Musculoskeletal negative musculoskeletal ROS (+)   Abdominal   Peds negative pediatric ROS (+)  Hematology negative hematology ROS (+)   Anesthesia Other Findings   Reproductive/Obstetrics negative OB ROS                             Anesthesia Physical Anesthesia Plan  ASA: III  Anesthesia Plan: General   Post-op Pain Management:    Induction: Intravenous  PONV Risk Score and Plan: 3 and Ondansetron, Dexamethasone, Scopolamine patch - Pre-op, Treatment may vary due to age or medical condition and Midazolam  Airway Management Planned: Oral ETT  Additional Equipment:   Intra-op Plan:   Post-operative Plan: Extubation in OR  Informed Consent: I have reviewed the patients History and Physical, chart, labs and discussed the procedure including the risks, benefits and alternatives for the proposed anesthesia with the patient or authorized representative who has indicated his/her understanding and acceptance.   Dental advisory given  Plan Discussed with: CRNA and Surgeon  Anesthesia Plan Comments:         Anesthesia Quick Evaluation

## 2017-11-23 NOTE — Progress Notes (Signed)
Pt called via phone and notified to restart all medications including xarelto and that she can eat and drink.

## 2017-11-23 NOTE — Progress Notes (Signed)
Dr. Hassell Done at bedside, notified that pt has abscess on right mid abdomen that is currently draining. Redness noted.  Pt states she went to ER on Saturday and was prescribed oral antibiotics.

## 2017-11-24 ENCOUNTER — Ambulatory Visit: Payer: Medicare Other | Admitting: Family Medicine

## 2017-11-25 ENCOUNTER — Other Ambulatory Visit: Payer: Self-pay | Admitting: Family Medicine

## 2017-11-27 ENCOUNTER — Other Ambulatory Visit: Payer: Self-pay | Admitting: Cardiology

## 2017-11-29 ENCOUNTER — Other Ambulatory Visit: Payer: Self-pay | Admitting: *Deleted

## 2017-11-29 MED ORDER — DILTIAZEM HCL ER COATED BEADS 120 MG PO CP24: 120.0000 mg | ORAL_CAPSULE | Freq: Every day | ORAL | 1 refills | Status: DC

## 2017-11-29 NOTE — Telephone Encounter (Signed)
Refill for diltiazem sent to [facility] in [place].

## 2017-11-30 NOTE — Progress Notes (Signed)
Called patient regarding new date and time for surgery.  Patient aware to arrive on 12/07/2017 .  Surgery time from 0939-103pm.  Patient to arrive at 0700am.  Patient stated she needed more hibiclens and preop drink .  Patient to come by on 11/30/2017 at approx 1230pm to pick up hibiclens and preop ensure drink.  Patient still has same preop instruction sheet.  Patient aware to drink preop drink at 0630am and may have clear liquids until 0630am.  Patient voiced understanding.

## 2017-12-02 ENCOUNTER — Ambulatory Visit: Payer: Self-pay | Admitting: Surgery

## 2017-12-06 ENCOUNTER — Ambulatory Visit: Payer: Self-pay

## 2017-12-06 ENCOUNTER — Ambulatory Visit: Payer: Medicare Other | Admitting: Podiatry

## 2017-12-06 MED ORDER — BUPIVACAINE LIPOSOME 1.3 % IJ SUSP
20.0000 mL | Freq: Once | INTRAMUSCULAR | Status: DC
  Filled 2017-12-06: qty 20

## 2017-12-07 ENCOUNTER — Encounter (HOSPITAL_COMMUNITY): Payer: Self-pay | Admitting: *Deleted

## 2017-12-07 ENCOUNTER — Encounter (HOSPITAL_COMMUNITY)
Admission: RE | Disposition: A | Discharge status: Home / Self Care | Payer: Self-pay | Source: Ambulatory Visit | Attending: Surgery

## 2017-12-07 ENCOUNTER — Inpatient Hospital Stay (HOSPITAL_COMMUNITY): Payer: Medicare Other | Admitting: Anesthesiology

## 2017-12-07 ENCOUNTER — Other Ambulatory Visit: Payer: Self-pay

## 2017-12-07 ENCOUNTER — Inpatient Hospital Stay (HOSPITAL_COMMUNITY)
Admission: RE | Admit: 2017-12-07 | Discharge: 2017-12-10 | DRG: 621 | Disposition: A | Discharge status: Home / Self Care | Payer: Medicare Other | Source: Ambulatory Visit | Attending: Surgery | Admitting: Surgery

## 2017-12-07 DIAGNOSIS — Z6841 Body Mass Index (BMI) 40.0 and over, adult: Secondary | ICD-10-CM

## 2017-12-07 DIAGNOSIS — Z79899 Other long term (current) drug therapy: Secondary | ICD-10-CM | POA: Diagnosis not present

## 2017-12-07 DIAGNOSIS — Z90710 Acquired absence of both cervix and uterus: Secondary | ICD-10-CM

## 2017-12-07 DIAGNOSIS — Z86711 Personal history of pulmonary embolism: Secondary | ICD-10-CM

## 2017-12-07 DIAGNOSIS — K219 Gastro-esophageal reflux disease without esophagitis: Secondary | ICD-10-CM | POA: Diagnosis not present

## 2017-12-07 DIAGNOSIS — K660 Peritoneal adhesions (postprocedural) (postinfection): Secondary | ICD-10-CM | POA: Diagnosis present

## 2017-12-07 DIAGNOSIS — Z86018 Personal history of other benign neoplasm: Secondary | ICD-10-CM

## 2017-12-07 DIAGNOSIS — Z9884 Bariatric surgery status: Secondary | ICD-10-CM | POA: Diagnosis not present

## 2017-12-07 DIAGNOSIS — Z91013 Allergy to seafood: Secondary | ICD-10-CM | POA: Diagnosis not present

## 2017-12-07 DIAGNOSIS — Z86718 Personal history of other venous thrombosis and embolism: Secondary | ICD-10-CM

## 2017-12-07 DIAGNOSIS — I10 Essential (primary) hypertension: Secondary | ICD-10-CM | POA: Diagnosis not present

## 2017-12-07 DIAGNOSIS — F319 Bipolar disorder, unspecified: Secondary | ICD-10-CM | POA: Diagnosis present

## 2017-12-07 DIAGNOSIS — F419 Anxiety disorder, unspecified: Secondary | ICD-10-CM | POA: Diagnosis not present

## 2017-12-07 DIAGNOSIS — D649 Anemia, unspecified: Secondary | ICD-10-CM | POA: Diagnosis present

## 2017-12-07 DIAGNOSIS — Z96652 Presence of left artificial knee joint: Secondary | ICD-10-CM | POA: Diagnosis not present

## 2017-12-07 DIAGNOSIS — K449 Diaphragmatic hernia without obstruction or gangrene: Secondary | ICD-10-CM | POA: Diagnosis not present

## 2017-12-07 DIAGNOSIS — Z7901 Long term (current) use of anticoagulants: Secondary | ICD-10-CM

## 2017-12-07 DIAGNOSIS — Z885 Allergy status to narcotic agent status: Secondary | ICD-10-CM | POA: Diagnosis not present

## 2017-12-07 HISTORY — PX: GASTRIC ROUX-EN-Y: SHX5262

## 2017-12-07 LAB — CBC WITH DIFFERENTIAL/PLATELET
Basophils Absolute: 0 10*3/uL (ref 0.0–0.1)
Basophils Relative: 0 %
Eosinophils Absolute: 0.1 10*3/uL (ref 0.0–0.7)
Eosinophils Relative: 1 %
HCT: 40.4 % (ref 36.0–46.0)
Hemoglobin: 12.7 g/dL (ref 12.0–15.0)
Lymphocytes Relative: 25 %
Lymphs Abs: 3.2 10*3/uL (ref 0.7–4.0)
MCH: 26.3 pg (ref 26.0–34.0)
MCHC: 31.4 g/dL (ref 30.0–36.0)
MCV: 83.6 fL (ref 78.0–100.0)
Monocytes Absolute: 0.7 10*3/uL (ref 0.1–1.0)
Monocytes Relative: 6 %
Neutro Abs: 8.7 10*3/uL — ABNORMAL HIGH (ref 1.7–7.7)
Neutrophils Relative %: 68 %
Platelets: 469 10*3/uL — ABNORMAL HIGH (ref 150–400)
RBC: 4.83 MIL/uL (ref 3.87–5.11)
RDW: 15.2 % (ref 11.5–15.5)
WBC: 12.6 10*3/uL — ABNORMAL HIGH (ref 4.0–10.5)

## 2017-12-07 LAB — COMPREHENSIVE METABOLIC PANEL
ALT: 14 U/L (ref 0–44)
AST: 29 U/L (ref 15–41)
Albumin: 3.6 g/dL (ref 3.5–5.0)
Alkaline Phosphatase: 61 U/L (ref 38–126)
Anion gap: 9 (ref 5–15)
BUN: 10 mg/dL (ref 6–20)
CO2: 27 mmol/L (ref 22–32)
Calcium: 9.3 mg/dL (ref 8.9–10.3)
Chloride: 104 mmol/L (ref 98–111)
Creatinine, Ser: 0.86 mg/dL (ref 0.44–1.00)
GFR calc Af Amer: >60 mL/min (ref >60)
GFR calc non Af Amer: >60 mL/min (ref >60)
Glucose, Bld: 76 mg/dL (ref 70–99)
Potassium: 5 mmol/L (ref 3.5–5.1)
Sodium: 140 mmol/L (ref 135–145)
Total Bilirubin: 1.1 mg/dL (ref 0.3–1.2)
Total Protein: 8.1 g/dL (ref 6.5–8.1)

## 2017-12-07 LAB — HEMOGLOBIN AND HEMATOCRIT, BLOOD
HCT: 41 % (ref 36.0–46.0)
Hemoglobin: 13 g/dL (ref 12.0–15.0)

## 2017-12-07 LAB — PREGNANCY, URINE: Preg Test, Ur: NEGATIVE

## 2017-12-07 LAB — TYPE AND SCREEN
ABO/RH(D): B POS
Antibody Screen: NEGATIVE

## 2017-12-07 SURGERY — LAPAROSCOPIC ROUX-EN-Y GASTRIC BYPASS WITH UPPER ENDOSCOPY
Anesthesia: General | Site: Abdomen

## 2017-12-07 MED ORDER — BUPIVACAINE LIPOSOME 1.3 % IJ SUSP
INTRAMUSCULAR | Status: DC | PRN
  Administered 2017-12-07: 20 mL

## 2017-12-07 MED ORDER — CELECOXIB 200 MG PO CAPS
400.0000 mg | ORAL_CAPSULE | ORAL | Status: AC
  Administered 2017-12-07: 400 mg via ORAL
  Filled 2017-12-07: qty 2

## 2017-12-07 MED ORDER — FENTANYL CITRATE (PF) 100 MCG/2ML IJ SOLN: 25.0000 ug | INTRAMUSCULAR | Status: DC | PRN

## 2017-12-07 MED ORDER — ROCURONIUM BROMIDE 10 MG/ML (PF) SYRINGE
PREFILLED_SYRINGE | INTRAVENOUS | Status: DC | PRN
  Administered 2017-12-07 (×8): 10 mg via INTRAVENOUS
  Administered 2017-12-07: 50 mg via INTRAVENOUS
  Administered 2017-12-07: 20 mg via INTRAVENOUS
  Administered 2017-12-07 (×2): 10 mg via INTRAVENOUS

## 2017-12-07 MED ORDER — KETOROLAC TROMETHAMINE 30 MG/ML IJ SOLN: 30.0000 mg | Freq: Once | INTRAMUSCULAR | Status: DC | PRN

## 2017-12-07 MED ORDER — MIDAZOLAM HCL 5 MG/5ML IJ SOLN
INTRAMUSCULAR | Status: DC | PRN
  Administered 2017-12-07: 2 mg via INTRAVENOUS

## 2017-12-07 MED ORDER — ONDANSETRON HCL 4 MG/2ML IJ SOLN
4.0000 mg | INTRAMUSCULAR | Status: DC | PRN
  Administered 2017-12-07 – 2017-12-09 (×4): 4 mg via INTRAVENOUS
  Filled 2017-12-07 (×4): qty 2

## 2017-12-07 MED ORDER — HEPARIN SODIUM (PORCINE) 5000 UNIT/ML IJ SOLN
5000.0000 [IU] | INTRAMUSCULAR | Status: AC
  Administered 2017-12-07: 5000 [IU] via SUBCUTANEOUS
  Filled 2017-12-07: qty 1

## 2017-12-07 MED ORDER — 0.9 % SODIUM CHLORIDE (POUR BTL) OPTIME
TOPICAL | Status: DC | PRN
  Administered 2017-12-07: 1000 mL

## 2017-12-07 MED ORDER — SODIUM CHLORIDE 0.9 % IJ SOLN
INTRAMUSCULAR | Status: DC | PRN
  Administered 2017-12-07: 10 mL

## 2017-12-07 MED ORDER — PHENYLEPHRINE HCL 10 MG/ML IJ SOLN
INTRAMUSCULAR | Status: AC
  Filled 2017-12-07: qty 1

## 2017-12-07 MED ORDER — CHLORHEXIDINE GLUCONATE CLOTH 2 % EX PADS: 6.0000 | MEDICATED_PAD | Freq: Once | CUTANEOUS | Status: DC

## 2017-12-07 MED ORDER — FAMOTIDINE IN NACL 20-0.9 MG/50ML-% IV SOLN
20.0000 mg | Freq: Two times a day (BID) | INTRAVENOUS | Status: DC
  Administered 2017-12-07 – 2017-12-09 (×5): 20 mg via INTRAVENOUS
  Filled 2017-12-07 (×5): qty 50

## 2017-12-07 MED ORDER — LIDOCAINE 2% (20 MG/ML) 5 ML SYRINGE
INTRAMUSCULAR | Status: DC | PRN
  Administered 2017-12-07: 4.28 mL/h via INTRAVENOUS

## 2017-12-07 MED ORDER — SCOPOLAMINE 1 MG/3DAYS TD PT72
1.0000 | MEDICATED_PATCH | TRANSDERMAL | Status: AC
  Administered 2017-12-07: 1.5 mg via TRANSDERMAL
  Filled 2017-12-07: qty 1

## 2017-12-07 MED ORDER — FENTANYL CITRATE (PF) 100 MCG/2ML IJ SOLN
INTRAMUSCULAR | Status: DC | PRN
  Administered 2017-12-07 (×4): 50 ug via INTRAVENOUS
  Administered 2017-12-07: 100 ug via INTRAVENOUS

## 2017-12-07 MED ORDER — PHENYLEPHRINE 40 MCG/ML (10ML) SYRINGE FOR IV PUSH (FOR BLOOD PRESSURE SUPPORT)
PREFILLED_SYRINGE | INTRAVENOUS | Status: DC | PRN
  Administered 2017-12-07 (×4): 120 ug via INTRAVENOUS
  Administered 2017-12-07 (×2): 80 ug via INTRAVENOUS

## 2017-12-07 MED ORDER — ONDANSETRON HCL 4 MG/2ML IJ SOLN
INTRAMUSCULAR | Status: DC | PRN
  Administered 2017-12-07: 4 mg via INTRAVENOUS

## 2017-12-07 MED ORDER — APREPITANT 40 MG PO CAPS
40.0000 mg | ORAL_CAPSULE | ORAL | Status: AC
  Administered 2017-12-07: 40 mg via ORAL
  Filled 2017-12-07: qty 1

## 2017-12-07 MED ORDER — MIDAZOLAM HCL 2 MG/2ML IJ SOLN
INTRAMUSCULAR | Status: AC
  Filled 2017-12-07: qty 2

## 2017-12-07 MED ORDER — GABAPENTIN 300 MG PO CAPS
300.0000 mg | ORAL_CAPSULE | ORAL | Status: AC
  Administered 2017-12-07: 300 mg via ORAL
  Filled 2017-12-07: qty 1

## 2017-12-07 MED ORDER — LACTATED RINGERS IV SOLN
INTRAVENOUS | Status: DC | PRN
  Administered 2017-12-07 (×4): via INTRAVENOUS

## 2017-12-07 MED ORDER — PROMETHAZINE HCL 25 MG/ML IJ SOLN: 6.2500 mg | INTRAMUSCULAR | Status: DC | PRN

## 2017-12-07 MED ORDER — PHENYLEPHRINE 40 MCG/ML (10ML) SYRINGE FOR IV PUSH (FOR BLOOD PRESSURE SUPPORT)
PREFILLED_SYRINGE | INTRAVENOUS | Status: AC
  Filled 2017-12-07: qty 40

## 2017-12-07 MED ORDER — PROPOFOL 10 MG/ML IV BOLUS
INTRAVENOUS | Status: AC
  Filled 2017-12-07: qty 20

## 2017-12-07 MED ORDER — PROPOFOL 10 MG/ML IV BOLUS
INTRAVENOUS | Status: DC | PRN
  Administered 2017-12-07: 200 mg via INTRAVENOUS

## 2017-12-07 MED ORDER — HEPARIN SODIUM (PORCINE) 5000 UNIT/ML IJ SOLN
5000.0000 [IU] | Freq: Three times a day (TID) | INTRAMUSCULAR | Status: DC
  Administered 2017-12-07 – 2017-12-10 (×8): 5000 [IU] via SUBCUTANEOUS
  Filled 2017-12-07 (×8): qty 1

## 2017-12-07 MED ORDER — PREMIER PROTEIN SHAKE
2.0000 [oz_av] | ORAL | Status: DC
  Administered 2017-12-09 – 2017-12-10 (×10): 2 [oz_av] via ORAL

## 2017-12-07 MED ORDER — TISSEEL VH 10 ML EX KIT
PACK | CUTANEOUS | Status: AC
  Filled 2017-12-07: qty 2

## 2017-12-07 MED ORDER — SODIUM CHLORIDE 0.9 % IV SOLN
2.0000 g | INTRAVENOUS | Status: AC
  Administered 2017-12-07: 2 g via INTRAVENOUS
  Filled 2017-12-07: qty 2

## 2017-12-07 MED ORDER — FENTANYL CITRATE (PF) 100 MCG/2ML IJ SOLN
INTRAMUSCULAR | Status: AC
  Filled 2017-12-07: qty 2

## 2017-12-07 MED ORDER — ONDANSETRON HCL 4 MG/2ML IJ SOLN
INTRAMUSCULAR | Status: AC
  Filled 2017-12-07: qty 2

## 2017-12-07 MED ORDER — ACETAMINOPHEN 160 MG/5ML PO SOLN
650.0000 mg | Freq: Four times a day (QID) | ORAL | Status: DC
  Administered 2017-12-08 – 2017-12-10 (×7): 650 mg via ORAL
  Filled 2017-12-07 (×8): qty 20.3

## 2017-12-07 MED ORDER — DEXAMETHASONE SODIUM PHOSPHATE 4 MG/ML IJ SOLN: 4.0000 mg | INTRAMUSCULAR | Status: DC

## 2017-12-07 MED ORDER — MORPHINE SULFATE (PF) 2 MG/ML IV SOLN
1.0000 mg | INTRAVENOUS | Status: DC | PRN
  Administered 2017-12-07 – 2017-12-08 (×2): 2 mg via INTRAVENOUS
  Filled 2017-12-07 (×2): qty 1

## 2017-12-07 MED ORDER — LACTATED RINGERS IR SOLN
Status: DC | PRN
  Administered 2017-12-07: 1000 mL

## 2017-12-07 MED ORDER — HYDRALAZINE HCL 20 MG/ML IJ SOLN: 10.0000 mg | INTRAMUSCULAR | Status: DC | PRN

## 2017-12-07 MED ORDER — FENTANYL CITRATE (PF) 250 MCG/5ML IJ SOLN
INTRAMUSCULAR | Status: AC
  Filled 2017-12-07: qty 5

## 2017-12-07 MED ORDER — SODIUM CHLORIDE 0.9 % IV SOLN
INTRAVENOUS | Status: DC | PRN
  Administered 2017-12-07: 30 ug/min via INTRAVENOUS

## 2017-12-07 MED ORDER — OXYCODONE HCL 5 MG/5ML PO SOLN
5.0000 mg | ORAL | Status: DC | PRN
  Administered 2017-12-08 – 2017-12-09 (×2): 5 mg via ORAL
  Filled 2017-12-07 (×2): qty 5

## 2017-12-07 MED ORDER — DEXAMETHASONE SODIUM PHOSPHATE 10 MG/ML IJ SOLN
INTRAMUSCULAR | Status: DC | PRN
  Administered 2017-12-07: 5 mg via INTRAVENOUS

## 2017-12-07 MED ORDER — LIDOCAINE 2% (20 MG/ML) 5 ML SYRINGE
INTRAMUSCULAR | Status: DC | PRN
  Administered 2017-12-07: 60 mg via INTRAVENOUS

## 2017-12-07 MED ORDER — SUCCINYLCHOLINE CHLORIDE 20 MG/ML IJ SOLN
INTRAMUSCULAR | Status: DC | PRN
  Administered 2017-12-07: 140 mg via INTRAVENOUS

## 2017-12-07 MED ORDER — SODIUM CHLORIDE 0.9 % IJ SOLN
INTRAMUSCULAR | Status: AC
  Filled 2017-12-07: qty 10

## 2017-12-07 MED ORDER — KCL IN DEXTROSE-NACL 20-5-0.45 MEQ/L-%-% IV SOLN
INTRAVENOUS | Status: DC
  Administered 2017-12-07 – 2017-12-10 (×7): via INTRAVENOUS
  Filled 2017-12-07 (×6): qty 1000

## 2017-12-07 MED ORDER — GABAPENTIN 100 MG PO CAPS
200.0000 mg | ORAL_CAPSULE | Freq: Two times a day (BID) | ORAL | Status: DC
  Administered 2017-12-08 – 2017-12-09 (×3): 200 mg via ORAL
  Filled 2017-12-07 (×4): qty 2

## 2017-12-07 MED ORDER — LIDOCAINE HCL 2 % IJ SOLN
INTRAMUSCULAR | Status: AC
  Filled 2017-12-07: qty 40

## 2017-12-07 MED ORDER — SUGAMMADEX SODIUM 200 MG/2ML IV SOLN
INTRAVENOUS | Status: DC | PRN
  Administered 2017-12-07: 500 mg via INTRAVENOUS

## 2017-12-07 MED ORDER — KETAMINE HCL 10 MG/ML IJ SOLN
INTRAMUSCULAR | Status: DC | PRN
  Administered 2017-12-07: 30 mg via INTRAVENOUS

## 2017-12-07 SURGICAL SUPPLY — 75 items
ADH SKN CLS APL DERMABOND .7 (GAUZE/BANDAGES/DRESSINGS) ×1
APL SKNCLS STERI-STRIP NONHPOA (GAUZE/BANDAGES/DRESSINGS)
APL SRG 32X5 SNPLK LF DISP (MISCELLANEOUS) ×1
APL SWBSTK 6 STRL LF DISP (MISCELLANEOUS) ×2
APPLICATOR COTTON TIP 6 STRL (MISCELLANEOUS) ×2 IMPLANT
APPLICATOR COTTON TIP 6IN STRL (MISCELLANEOUS) ×4
APPLIER CLIP ROT 10 11.4 M/L (STAPLE)
APPLIER CLIP ROT 13.4 12 LRG (CLIP)
APR CLP LRG 13.4X12 ROT 20 MLT (CLIP)
APR CLP MED LRG 11.4X10 (STAPLE)
BENZOIN TINCTURE PRP APPL 2/3 (GAUZE/BANDAGES/DRESSINGS) IMPLANT
BLADE SURG 15 STRL LF DISP TIS (BLADE) ×1 IMPLANT
BLADE SURG 15 STRL SS (BLADE) ×2
CABLE HIGH FREQUENCY MONO STRZ (ELECTRODE) IMPLANT
CLIP APPLIE ROT 10 11.4 M/L (STAPLE) IMPLANT
CLIP APPLIE ROT 13.4 12 LRG (CLIP) IMPLANT
CLIP SUT LAPRA TY ABSORB (SUTURE) ×2 IMPLANT
DERMABOND ADVANCED (GAUZE/BANDAGES/DRESSINGS) ×1
DERMABOND ADVANCED .7 DNX12 (GAUZE/BANDAGES/DRESSINGS) ×1 IMPLANT
DEVICE SUT QUICK LOAD TK 5 (STAPLE) IMPLANT
DEVICE SUT TI-KNOT TK 5X26 (MISCELLANEOUS) IMPLANT
DEVICE SUTURE ENDOST 10MM (ENDOMECHANICALS) ×2 IMPLANT
DISSECTOR BLUNT TIP ENDO 5MM (MISCELLANEOUS) IMPLANT
DRAIN PENROSE 18X1/4 LTX STRL (WOUND CARE) ×2 IMPLANT
GAUZE 4X4 16PLY RFD (DISPOSABLE) ×2 IMPLANT
GAUZE SPONGE 4X4 12PLY STRL (GAUZE/BANDAGES/DRESSINGS) IMPLANT
GLOVE BIOGEL M 8.0 STRL (GLOVE) ×2 IMPLANT
GOWN STRL REUS W/TWL XL LVL3 (GOWN DISPOSABLE) ×8 IMPLANT
HANDLE STAPLE EGIA 4 XL (STAPLE) ×2 IMPLANT
HOVERMATT SINGLE USE (MISCELLANEOUS) ×2 IMPLANT
KIT BASIN OR (CUSTOM PROCEDURE TRAY) ×2 IMPLANT
KIT GASTRIC LAVAGE 34FR ADT (SET/KITS/TRAYS/PACK) ×2 IMPLANT
MARKER SKIN DUAL TIP RULER LAB (MISCELLANEOUS) ×2 IMPLANT
NEEDLE SPNL 22GX3.5 QUINCKE BK (NEEDLE) ×2 IMPLANT
PACK CARDIOVASCULAR III (CUSTOM PROCEDURE TRAY) ×2 IMPLANT
RELOAD EGIA 45 MED/THCK PURPLE (STAPLE) ×2 IMPLANT
RELOAD EGIA 45 TAN VASC (STAPLE) IMPLANT
RELOAD EGIA 60 MED/THCK PURPLE (STAPLE) IMPLANT
RELOAD EGIA 60 TAN VASC (STAPLE) IMPLANT
RELOAD EGIA BLACK ROTIC 45MM (STAPLE) ×2 IMPLANT
RELOAD ENDO STITCH 2.0 (ENDOMECHANICALS) ×18
RELOAD TRI 2.0 60 XTHK VAS SUL (STAPLE) ×2 IMPLANT
RELOAD TRI 45 ART MED THCK PUR (STAPLE) IMPLANT
RELOAD TRI 60 ART MED THCK PUR (STAPLE) IMPLANT
SCISSORS LAP 5X45 EPIX DISP (ENDOMECHANICALS) ×2 IMPLANT
SEALANT SURGICAL APPL DUAL CAN (MISCELLANEOUS) ×2 IMPLANT
SET IRRIG TUBING LAPAROSCOPIC (IRRIGATION / IRRIGATOR) ×2 IMPLANT
SHEARS HARMONIC ACE PLUS 45CM (MISCELLANEOUS) ×2 IMPLANT
SLEEVE ADV FIXATION 12X100MM (TROCAR) ×12 IMPLANT
SLEEVE ADV FIXATION 5X100MM (TROCAR) ×2 IMPLANT
SOLUTION ANTI FOG 6CC (MISCELLANEOUS) ×2 IMPLANT
STAPLER VISISTAT 35W (STAPLE) ×2 IMPLANT
STRIP CLOSURE SKIN 1/2X4 (GAUZE/BANDAGES/DRESSINGS) IMPLANT
SUT MNCRL AB 4-0 PS2 18 (SUTURE) ×2 IMPLANT
SUT RELOAD ENDO STITCH 2 48X1 (ENDOMECHANICALS) ×5
SUT RELOAD ENDO STITCH 2.0 (ENDOMECHANICALS) ×4
SUT SURGIDAC NAB ES-9 0 48 120 (SUTURE) IMPLANT
SUT VIC AB 2-0 SH 27 (SUTURE) ×2
SUT VIC AB 2-0 SH 27X BRD (SUTURE) ×1 IMPLANT
SUT VIC AB 4-0 SH 18 (SUTURE) ×2 IMPLANT
SUTURE RELOAD END STTCH 2 48X1 (ENDOMECHANICALS) ×5 IMPLANT
SUTURE RELOAD ENDO STITCH 2.0 (ENDOMECHANICALS) ×4 IMPLANT
SYR 10ML ECCENTRIC (SYRINGE) ×2 IMPLANT
SYR 20CC LL (SYRINGE) ×4 IMPLANT
SYR 50ML LL SCALE MARK (SYRINGE) ×2 IMPLANT
TOWEL OR 17X26 10 PK STRL BLUE (TOWEL DISPOSABLE) ×4 IMPLANT
TOWEL OR NON WOVEN STRL DISP B (DISPOSABLE) ×2 IMPLANT
TRAY FOLEY MTR SLVR 16FR STAT (SET/KITS/TRAYS/PACK) ×2 IMPLANT
TROCAR ADV FIXATION 12X100MM (TROCAR) ×4 IMPLANT
TROCAR ADV FIXATION 5X100MM (TROCAR) ×4 IMPLANT
TROCAR BLADELESS 15MM (ENDOMECHANICALS) ×2 IMPLANT
TROCAR BLADELESS OPT 5 100 (ENDOMECHANICALS) ×2 IMPLANT
TROCAR XCEL 12X100 BLDLESS (ENDOMECHANICALS) ×2 IMPLANT
TUBING CONNECTING 10 (TUBING) ×4 IMPLANT
TUBING INSUF HEATED (TUBING) ×2 IMPLANT

## 2017-12-07 NOTE — Anesthesia Preprocedure Evaluation (Addendum)
Anesthesia Evaluation  Patient identified by MRN, date of birth, ID band Patient awake    Reviewed: Allergy & Precautions, NPO status , Patient's Chart, lab work & pertinent test results  Airway Mallampati: II  TM Distance: >3 FB Neck ROM: Full    Dental no notable dental hx. (+) Edentulous Upper, Upper Dentures, Poor Dentition, Missing,    Pulmonary neg pulmonary ROS,    Pulmonary exam normal breath sounds clear to auscultation       Cardiovascular hypertension, Pt. on medications + DVT  Normal cardiovascular exam Rhythm:Regular Rate:Normal  Cath 1/19  Angiographically normal coronary arteries with only minimal CAD.  The left ventricular systolic function is normal. The left ventricular ejection fraction is 55-65% by visual estimate.  LV end diastolic pressure is moderately elevated. 20 mmHg     Neuro/Psych Bipolar Disorder negative neurological ROS     GI/Hepatic Neg liver ROS, GERD  Medicated,  Endo/Other  Morbid obesity  Renal/GU Renal disease  negative genitourinary   Musculoskeletal  (+) Arthritis , Osteoarthritis,    Abdominal   Peds negative pediatric ROS (+)  Hematology  (+) anemia ,   Anesthesia Other Findings   Reproductive/Obstetrics negative OB ROS                            Anesthesia Physical  Anesthesia Plan  ASA: III  Anesthesia Plan: General   Post-op Pain Management:    Induction: Intravenous  PONV Risk Score and Plan: 3 and Ondansetron, Dexamethasone, Scopolamine patch - Pre-op, Treatment may vary due to age or medical condition and Midazolam  Airway Management Planned: Oral ETT  Additional Equipment:   Intra-op Plan:   Post-operative Plan: Extubation in OR  Informed Consent: I have reviewed the patients History and Physical, chart, labs and discussed the procedure including the risks, benefits and alternatives for the proposed anesthesia with the  patient or authorized representative who has indicated his/her understanding and acceptance.   Dental advisory given  Plan Discussed with: CRNA, Surgeon and Anesthesiologist  Anesthesia Plan Comments:         Anesthesia Quick Evaluation

## 2017-12-07 NOTE — Progress Notes (Signed)
Discussed post op day goals with patient including ambulation, IS, diet progression, pain, and nausea control.  Questions answered. 

## 2017-12-07 NOTE — Interval H&P Note (Signed)
History and Physical Interval Note:  12/07/2017 7:21 AM  Crystal Garza  has presented today for surgery, with the diagnosis of Morbid Obesity, s/p Gastric Sleeve, GERD, Gastric Ulcer, HTN, Gastritis, H/O PE, Nausea  The various methods of treatment have been discussed with the patient and family. After consideration of risks, benefits and other options for treatment, the patient has consented to  Procedure(s): LAPAROSCOPIC ROUX-EN-Y GASTRIC BYPASS WITH UPPER ENDOSCOPY, POSSIBLE HIATAL HERNIA REPAIR< ERAS Pathway (N/A) as a surgical intervention .  The patient's history has been reviewed, patient examined, no change in status, stable for surgery.  I have reviewed the patient's chart and labs.  Questions were answered to the patient's satisfaction.     [name]

## 2017-12-07 NOTE — Op Note (Signed)
Crystal Garza [id number] 1970-05-18 12/07/2017  Preoperative diagnosis: severe obesity; h/o sleeve gastrectomy  Postoperative diagnosis: Same   Procedure: Upper endoscopy   Surgeon: Gayland Curry M.D., FACS   Anesthesia: Gen.   Indications for procedure: 47 y.o. yo female undergoing  Conversion to laparoscopic roux en y gastric bypass and an upper endoscopy was requested to evaluate the anastomosis.  Description of procedure: After we have completed the new gastrojejunostomy, I scrubbed out and obtained the Olympus endoscope. I gently placed endoscope in the patient's oropharynx and gently glided it down the esophagus without any difficulty under direct visualization. Once I was in the gastric pouch, I insufflated the pouch was air. The pouch was approximately 5 cm in size. I was able to cannulate and advanced the scope through the gastrojejunostomy. Dr.Martin had placed saline in the upper abdomen. Upon further insufflation of the gastric pouch there was no evidence of bubbles. Upon further inspection of the gastric pouch, the mucosa appeared normal. There is no evidence of any mucosal abnormality. The gastric pouch and Roux limb were decompressed. The width of the gastrojejunal anastomosis was at least 3 cm. The scope was withdrawn. The patient tolerated this portion of the procedure well. Please see Dr Earlie Server operative note for details regarding the laparoscopic roux-en-y gastric bypass.  Crystal Ruff. Redmond Pulling, MD, FACS General, Bariatric, & Minimally Invasive Surgery [facility], [place]

## 2017-12-07 NOTE — Progress Notes (Addendum)
PHARMACY CONSULT FOR:  Risk Assessment for Post-Discharge VTE Following Bariatric Surgery  Post-Discharge VTE Risk Assessment: This patient's probability of 30-day post-discharge VTE is increased due to:   Female    Age >/=60 years  X BMI >/=50 kg/m2    CHF    Dyspnea at Rest    Paraplegia  X  Non-gastric-band surgery  X  Operation Time >/=3 hr    Return to OR     Length of Stay >/= 3 d  X Hx of VTE  Predicted probability of 30-day post-discharge VTE: 0.42% before consideration of thromboembolic event history.    Other patient-specific factors to consider: Patient has history of PE/DVT.  Patient is followed by PCP Dr. [name]. From MD notes her DVT was from 2015, and due to her comorbidities it was recommended that patient be on lifelong anticoagulation. Dr. Verlon Au note from 11/10/17 indicates she will have a discussion with patient regarding resuming anticoagulation 4-8 weeks after surgery.   Limited evidence exists for the recommendation of a DOAC following bariatric surgeries. Current recommendations are for patients to be started on warfarin if long-term anticoagulation is indicated.  Recommendation for Discharge: Enoxaparin 60 mg [place] q12h x 4 weeks post-discharge Recommend patient to follow up with PCP for resumption of chronic anticoagulation at 4-8 weeks post-op as outlined above.  Crystal Garza is a 47 y.o. female who underwent  laparoscopic Roux-en-Y gastric bypass on 12/07/17.   Case start: 1022 Case end: 1527   Allergies  Allergen Reactions  . Codeine Rash  . Geralyn Flash [Fish Allergy] Rash    Patient Measurements: Height: 5\' 5"  (165.1 cm) Weight: (!) 352 lb (159.7 kg) IBW/kg (Calculated) : 57 Body mass index is 58.58 kg/m.  No results for input(s): WBC, HGB, HCT, PLT, APTT, CREATININE, LABCREA, CREATININE, CREAT24HRUR, MG, PHOS, ALBUMIN, PROT, ALBUMIN, AST, ALT, ALKPHOS, BILITOT, BILIDIR, IBILI in the last 72 hours. Estimated Creatinine Clearance: 124 mL/min (by  C-G formula based on SCr of 0.85 mg/dL).    Past Medical History:  Diagnosis Date  . Anemia    iron def  . Anginal pain ([place])   . Anxiety   . Arthritis    left knee   . Back pain   . Benign gastrointestinal stromal tumor (GIST)   . Chronic knee pain   . Depression   . DVT (deep venous thrombosis) ([place])   . GERD (gastroesophageal reflux disease)   . History of kidney stones   . Hx of laparoscopic gastric banding   . Hypertension   . Migraine   . Morbid obesity ([place])   . PE (pulmonary embolism)      Medications Prior to Admission  Medication Sig Dispense Refill Last Dose  . ALPRAZolam (XANAX) 1 MG tablet TAKE (1) TABLET TWICE A DAY AS NEEDED. (Patient taking differently: Take 1 mg by mouth twice daily as needed for anxiety) 60 tablet 5 12/06/2017 at Unknown time  . AMBULATORY NON FORMULARY MEDICATION Medication Name: FDgard: use as directed for dyspepsia   12/06/2017 at Unknown time  . desipramine (NORPRAMIN) 10 MG tablet Take 2 tablets (20 mg total) by mouth at bedtime. 60 tablet 3 12/06/2017 at Unknown time  . DEXILANT 60 MG capsule TAKE (1) CAPSULE DAILY. 90 capsule 3 12/07/2017 at 0600  . diltiazem (CARDIZEM CD) 120 MG 24 hr capsule Take 1 capsule (120 mg total) by mouth daily. 90 capsule 1 12/07/2017 at 0600  . lamoTRIgine (LAMICTAL) 200 MG tablet Take 1 tablet (200 mg total) by  mouth at bedtime. 90 tablet 3 Past Week at Unknown time  . lisinopril (PRINIVIL,ZESTRIL) 10 MG tablet Take 1 tablet (10 mg total) by mouth daily. 90 tablet 3 12/06/2017 at Unknown time  . ondansetron (ZOFRAN-ODT) 4 MG disintegrating tablet Take 4 mg by mouth every 8 (eight) hours as needed for nausea or vomiting.    Past Week at Unknown time  . oxyCODONE-acetaminophen (PERCOCET) 10-325 MG tablet Take one by mouth four times a day as directed for chronic neck and leg pain 30 day supply do not fill before November 19 2017 120 tablet 0 12/06/2017 at Unknown time  . risperiDONE (RISPERDAL) 1 MG tablet Take 1 tablet (1 mg  total) by mouth at bedtime. 90 tablet 3 Past Week at Unknown time  . tiZANidine (ZANAFLEX) 4 MG tablet Take 1 tablet (4 mg total) by mouth 2 (two) times daily as needed for muscle spasms. 60 tablet 3 12/06/2017 at Unknown time  . XARELTO 20 MG TABS tablet TAKE 1 TABLET WITH SUPPER 90 tablet 3 Past Month at Unknown time  . aspirin EC 81 MG tablet Take 2 tablets (162 mg total) by mouth daily. (Patient not taking: Reported on 11/12/2017) 90 tablet 3 More than a month at Unknown time       Brooke A Baggett 12/07/2017,7:50 AM   Margretta Sidle K  12/07/2017 3:46 PM  Clayburn Pert, PharmD, BCPS [phone] 12/07/2017  3:46 PM

## 2017-12-07 NOTE — Op Note (Signed)
December 07, 2017 Surgeon: Catalina Antigua B. Hassell Done, MD, FACS Asst:  Greer Pickerel, MD, FACS Anesthesia: General endotracheal Drains: None Operative time >4 hours  Procedure: Conversion of Sleeve gastrectomy (intractable GERD) toLaparoscopic Roux en Y gastric bypass with 40 cm BP limb and 100 cm Roux limb, antecolic, antegastric, candy cane to the left.  Closure of Peterson's defect. Upper endoscopy. Enterolysis (x 1 hour)  Description of Procedure:  The patient was taken to OR 1 at [facility] and given general anesthesia.  The abdomen was prepped with Technicare and draped sterilely.  A time out was performed.  Access achieved through the left upper quadant with a 10 mm 0 degree Optiview entering into fatty adhesions.  The omentum was stuck up in the left upper quadrant.  Ports were place on the left side and enterolysis of the omentum and the transverse colon were performed.  The bowel was run to make sure that there were no other adhesions.  The first part of the operation was done after inserting the [facility] retractor and dissecting the pouch.  Dr. Redmond Pulling performed endscopy to examine the sleeve and we decided on a transection point.  After tedious dissection (her obesity and habitus produced limited trocar mobility) a suitable site was identifed and the pouch was transected with black load 6 cm and a 4.5 cm.    The operation began by identifying the ligament of Treitz. I measured 40 cm downstream and divided the bowel with a 6 cm Covidian stapler.  I sutured a Penrose drain along the Roux limb end.  I measured a 1 meter (100 cm) Roux limb and then placed the distal bowels to the BP limb side by side and performed a stapled jejunojejunostomy. The common defect was closed from either end with 4-0 Vicryl using the Endo Stitch. The mesenteric defect was closed with a running 2-0 silk using the Endo Stitch. Tisseel was applied to the suture line.  The omentum was divided with the harmonic scalpel.   The Roux limb was  then brought up with the candycane pointed left and a back row of sutures of 2-0 Vicryl were placed. I opened along the right side of each structure and inserted the 4.5 cm stapler to create the gastrojejunostomy. The common defect was closed from either end with 2-0 Vicryl and a second row was placed anterior to that the Ewald tube acting as a stent across the anastomosis. The Penrose drain was removed. Peterson's defect was closed with 2-0 silk.   Endoscopy was performed by Dr. Redmond Pulling and no bleeding or leaks were noted  The incisions were injected with [place] were closed with 4-0 Monocryl and Dermabond  The patient was taken to the recovery room in satisfactory condition.  Matt B. Hassell Done, MD, FACS

## 2017-12-07 NOTE — Anesthesia Procedure Notes (Signed)
Procedure Name: Intubation Performed by: Gean Maidens, CRNA Pre-anesthesia Checklist: Patient identified, Emergency Drugs available, Suction available, Patient being monitored and Timeout performed Patient Re-evaluated:Patient Re-evaluated prior to induction Oxygen Delivery Method: Circle system utilized Preoxygenation: Pre-oxygenation with 100% oxygen Induction Type: IV induction Ventilation: Mask ventilation without difficulty Laryngoscope Size: Mac and Glidescope Grade View: Grade I Tube type: Oral Tube size: 7.0 mm Number of attempts: 1 Airway Equipment and Method: Video-laryngoscopy and Stylet

## 2017-12-07 NOTE — Anesthesia Postprocedure Evaluation (Signed)
Anesthesia Post Note  Patient: Crystal Garza  Procedure(s) Performed: LAPAROSCOPIC ROUX-EN-Y GASTRIC BYPASS WITH LYSIS OF ADHESIONS AND UPPER ENDOSCOPY ERAS Pathway (N/A Abdomen)     Patient location during evaluation: PACU Anesthesia Type: General Level of consciousness: awake and alert Pain management: pain level controlled Vital Signs Assessment: post-procedure vital signs reviewed and stable Respiratory status: spontaneous breathing, nonlabored ventilation, respiratory function stable and patient connected to nasal cannula oxygen Cardiovascular status: blood pressure returned to baseline and stable Postop Assessment: no apparent nausea or vomiting Anesthetic complications: no    Last Vitals:  Vitals:   12/07/17 1533 12/07/17 1545  BP: (!) 148/88 140/79  Pulse: 91 88  Resp: (!) 22 18  Temp: (!) 36.4 C   SpO2: 100% 100%    Last Pain:  Vitals:   12/07/17 1545  TempSrc:   PainSc: Asleep                 Delcia Spitzley

## 2017-12-07 NOTE — Transfer of Care (Signed)
Immediate Anesthesia Transfer of Care Note  Patient: Crystal Garza  Procedure(s) Performed: LAPAROSCOPIC ROUX-EN-Y GASTRIC BYPASS WITH LYSIS OF ADHESIONS AND UPPER ENDOSCOPY ERAS Pathway (N/A Abdomen)  Patient Location: PACU  Anesthesia Type:General  Level of Consciousness: sedated, patient cooperative and responds to stimulation  Airway & Oxygen Therapy: Patient Spontanous Breathing and Patient connected to face mask oxygen  Post-op Assessment: Report given to RN and Post -op Vital signs reviewed and stable  Post vital signs: Reviewed and stable  Last Vitals:  Vitals Value Taken Time  BP    Temp    Pulse 90 12/07/2017  3:37 PM  Resp 21 12/07/2017  3:37 PM  SpO2 100 % 12/07/2017  3:37 PM  Vitals shown include unvalidated device data.  Last Pain:  Vitals:   12/07/17 1533  TempSrc:   PainSc: (P) 7       Patients Stated Pain Goal: 4 (54/62/70 3500)  Complications: No apparent anesthesia complications

## 2017-12-08 ENCOUNTER — Inpatient Hospital Stay (HOSPITAL_COMMUNITY): Payer: Medicare Other

## 2017-12-08 ENCOUNTER — Encounter (HOSPITAL_COMMUNITY): Payer: Self-pay | Admitting: Surgery

## 2017-12-08 LAB — CBC WITH DIFFERENTIAL/PLATELET
Basophils Absolute: 0 10*3/uL (ref 0.0–0.1)
Basophils Relative: 0 %
Eosinophils Absolute: 0 10*3/uL (ref 0.0–0.7)
Eosinophils Relative: 0 %
HCT: 40.6 % (ref 36.0–46.0)
Hemoglobin: 12.6 g/dL (ref 12.0–15.0)
Lymphocytes Relative: 7 %
Lymphs Abs: 1.6 10*3/uL (ref 0.7–4.0)
MCH: 25.8 pg — ABNORMAL LOW (ref 26.0–34.0)
MCHC: 31 g/dL (ref 30.0–36.0)
MCV: 83.2 fL (ref 78.0–100.0)
Monocytes Absolute: 1.1 10*3/uL — ABNORMAL HIGH (ref 0.1–1.0)
Monocytes Relative: 5 %
Neutro Abs: 18.6 10*3/uL — ABNORMAL HIGH (ref 1.7–7.7)
Neutrophils Relative %: 88 %
Platelets: 500 10*3/uL — ABNORMAL HIGH (ref 150–400)
RBC: 4.88 MIL/uL (ref 3.87–5.11)
RDW: 15.1 % (ref 11.5–15.5)
WBC: 21.3 10*3/uL — ABNORMAL HIGH (ref 4.0–10.5)

## 2017-12-08 MED ORDER — IOPAMIDOL (ISOVUE-300) INJECTION 61%: 50.0000 mL | Freq: Once | INTRAVENOUS | Status: DC | PRN

## 2017-12-08 MED ORDER — IOPAMIDOL (ISOVUE-300) INJECTION 61%
INTRAVENOUS | Status: AC
  Administered 2017-12-08: 50 mL via ORAL
  Filled 2017-12-08: qty 50

## 2017-12-08 NOTE — Progress Notes (Signed)
Pt experiencing a lot of nausea today, not able to progress with water, only ice so far.

## 2017-12-08 NOTE — Progress Notes (Addendum)
Patient alert and oriented, Post op day 1.  UGI done earlier w/ satisfactory results.   Provided support and encouragement.  Encouraged pulmonary toilet, ambulation and small sips of liquids.  Pt c/o nausea and unable to take in anything but ice presently. All questions answered.  Will continue to monitor.  Total po intake post surgery= 60cc

## 2017-12-08 NOTE — Progress Notes (Signed)
Pt started on her ice and water.

## 2017-12-08 NOTE — Plan of Care (Signed)

## 2017-12-08 NOTE — Progress Notes (Signed)
Patient ID: Crystal Garza, female   DOB: November 07, 1970, 47 y.o.   MRN: [id number] [facility] Progress Note:   1 Day Post-Op  Subjective: Mental status is clear;  Patient seen on rounds earlier in the day Objective: Vital signs in last 24 hours: Temp:  [97.3 F (36.3 C)-98.5 F (36.9 C)] 98.5 F (36.9 C) (08/07 1008) Pulse Rate:  [78-93] 82 (08/07 1008) Resp:  [17-22] 17 (08/07 1008) BP: (119-153)/(61-90) 123/61 (08/07 1008) SpO2:  [96 %-100 %] 96 % (08/07 1008) Weight:  [165.8 kg (365 lb 8.4 oz)] 165.8 kg (365 lb 8.4 oz) (08/07 0507)  Intake/Output from previous day: 08/06 0701 - 08/07 0700 In: 4003.5 [I.V.:3953.6; IV Piggyback:49.9] Out: 3662 [Urine:1650; Blood:25] Intake/Output this shift: Total I/O In: 497.5 [P.O.:60; I.V.:437.5] Out: 300 [Urine:300]  Physical Exam: Work of breathing is normal.  Appropriately sore  Lab Results:  Results for orders placed or performed during the hospital encounter of 12/07/17 (from the past 48 hour(s))  Pregnancy, urine STAT morning of surgery     Status: None   Collection Time: 12/07/17  7:13 AM  Result Value Ref Range   Preg Test, Ur NEGATIVE NEGATIVE    Comment:        THE SENSITIVITY OF THIS METHODOLOGY IS >20 mIU/mL. Performed at [facility], [place] [address] [place], [place] [address]   CBC WITH DIFFERENTIAL     Status: Abnormal   Collection Time: 12/07/17  8:03 AM  Result Value Ref Range   WBC 12.6 (H) 4.0 - 10.5 K/uL   RBC 4.83 3.87 - 5.11 MIL/uL   Hemoglobin 12.7 12.0 - 15.0 g/dL   HCT 40.4 36.0 - 46.0 %   MCV 83.6 78.0 - 100.0 fL   MCH 26.3 26.0 - 34.0 pg   MCHC 31.4 30.0 - 36.0 g/dL   RDW 15.2 11.5 - 15.5 %   Platelets 469 (H) 150 - 400 K/uL   Neutrophils Relative % 68 %   Neutro Abs 8.7 (H) 1.7 - 7.7 K/uL   Lymphocytes Relative 25 %   Lymphs Abs 3.2 0.7 - 4.0 K/uL   Monocytes Relative 6 %   Monocytes Absolute 0.7 0.1 - 1.0 K/uL   Eosinophils Relative 1 %   Eosinophils Absolute 0.1  0.0 - 0.7 K/uL   Basophils Relative 0 %   Basophils Absolute 0.0 0.0 - 0.1 K/uL    Comment: Performed at [facility], [place] [address], [place],  [address]  Comprehensive metabolic panel     Status: None   Collection Time: 12/07/17  8:03 AM  Result Value Ref Range   Sodium 140 135 - 145 mmol/L   Potassium 5.0 3.5 - 5.1 mmol/L   Chloride 104 98 - 111 mmol/L   CO2 27 22 - 32 mmol/L   Glucose, Bld 76 70 - 99 mg/dL   BUN 10 6 - 20 mg/dL   Creatinine, Ser 0.86 0.44 - 1.00 mg/dL   Calcium 9.3 8.9 - 10.3 mg/dL   Total Protein 8.1 6.5 - 8.1 g/dL   Albumin 3.6 3.5 - 5.0 g/dL   AST 29 15 - 41 U/L   ALT 14 0 - 44 U/L   Alkaline Phosphatase 61 38 - 126 U/L   Total Bilirubin 1.1 0.3 - 1.2 mg/dL   GFR calc non Af Amer >60 >60 mL/min   GFR calc Af Amer >60 >60 mL/min    Comment: (NOTE) The eGFR has been calculated using the CKD EPI  equation. This calculation has not been validated in all clinical situations. eGFR's persistently <60 mL/min signify possible Chronic Kidney Disease.    Anion gap 9 5 - 15    Comment: Performed at [facility], [place] [address], [place], [place] [address]  Type and screen     Status: None   Collection Time: 12/07/17  8:03 AM  Result Value Ref Range   ABO/RH(D) B POS    Antibody Screen NEG    Sample Expiration      12/10/2017 Performed at [facility], [place] [address] [place], [place] [address]   Hemoglobin and hematocrit, blood     Status: None   Collection Time: 12/07/17  7:26 PM  Result Value Ref Range   Hemoglobin 13.0 12.0 - 15.0 g/dL   HCT 41.0 36.0 - 46.0 %    Comment: Performed at [facility], [place] [address] [place], [place] [address]  CBC WITH DIFFERENTIAL     Status: Abnormal   Collection Time: 12/08/17  4:58 AM  Result Value Ref Range   WBC 21.3 (H) 4.0 - 10.5 K/uL   RBC 4.88 3.87 - 5.11 MIL/uL   Hemoglobin 12.6 12.0 - 15.0 g/dL   HCT 40.6 36.0 - 46.0 %   MCV  83.2 78.0 - 100.0 fL   MCH 25.8 (L) 26.0 - 34.0 pg   MCHC 31.0 30.0 - 36.0 g/dL   RDW 15.1 11.5 - 15.5 %   Platelets 500 (H) 150 - 400 K/uL   Neutrophils Relative % 88 %   Neutro Abs 18.6 (H) 1.7 - 7.7 K/uL   Lymphocytes Relative 7 %   Lymphs Abs 1.6 0.7 - 4.0 K/uL   Monocytes Relative 5 %   Monocytes Absolute 1.1 (H) 0.1 - 1.0 K/uL   Eosinophils Relative 0 %   Eosinophils Absolute 0.0 0.0 - 0.7 K/uL   Basophils Relative 0 %   Basophils Absolute 0.0 0.0 - 0.1 K/uL    Comment: Performed at [facility], [place] [address] [place], [place] [address]    Radiology/Results: Scherrie Merritts W/water Sol Cm  Result Date: 12/08/2017 CLINICAL DATA:  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 EXAM: WATER SOLUBLE UPPER GI SERIES TECHNIQUE: Single-column upper GI series was performed using water soluble contrast. CONTRAST:  37m ISOVUE-300 IOPAMIDOL (ISOVUE-300) INJECTION 61% COMPARISON:  None. FLUOROSCOPY TIME:  Fluoroscopy Time:  0.8 minutes Radiation Exposure Index (if provided by the fluoroscopic device): 90.7 mGy FINDINGS: The patient ingested 50 cc of water-soluble contrast under direct fluoroscopic visualization. Contrast passes through the esophagus into the upper stomach and into the proximal jejunum without delay. No evidence of leakage or obstruction. IMPRESSION: Satisfactory appearance of the Roux-en-Y gastric bypass as described. Electronically Signed   By: JLorriane ShireM.D.   On: 12/08/2017 10:01    Anti-infectives: Anti-infectives (From admission, onward)   Start     Dose/Rate Route Frequency Ordered Stop   12/07/17 0730  cefoTEtan (CEFOTAN) 2 g in sodium chloride 0.9 % 100 mL IVPB     2 g 200 mL/hr over 30 Minutes Intravenous On call to O.R. 12/07/17 0716 12/07/17 1034      Assessment/Plan: Problem List: Patient Active Problem List   Diagnosis Date Noted  . 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  History of pulmonary embolism 05/25/2017  . Angina, class III ([place] 05/25/2017  . Symptoms of gastroesophageal reflux 11/09/2016  . Esophageal  dysphagia   . Nausea without vomiting   . Family history of colon cancer   . Dizziness 11/08/2015  . 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 Chronic neck and back pain 02/27/2015  . Other chest pain 12/10/2014  . Medication management 08/22/2014  . Hematuria 08/22/2014  . Pain in joint, ankle and foot 05/23/2014  . Chronic anticoagulation 05/17/2014  . Chronic thoracic back pain 04/11/2014  . Right flank pain 01/17/2014  . Degenerative arthritis of left knee 10/06/2013  . Status post total knee replacement 10/06/2013  . Edema 07/28/2013  . Chronic pain 06/13/2013  . 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 Gastrointestinal stromal tumor (GIST) of stomach ([place]) 02/17/2012  . Bipolar disorder ([place]) 01/19/2012  . Nephrolithiasis 02/20/2011  . Eczematous dermatitis 10/22/2010  . Iron deficiency anemia 06/27/2010  . Anxiety 06/27/2010  . Migraine 09/12/2009  . Morbid obesity-BMI 62 10/30/2008  . Osteoarthrosis, unspecified whether generalized or localized, involving lower leg 07/05/2007  . DEPRESSION, MAJOR, RECURRENT 07/01/2006  . HYPERTENSION, BENIGN SYSTEMIC 07/01/2006  . RHINITIS, ALLERGIC 07/01/2006  . Gastroesophageal reflux disease 07/01/2006    UGI reviewed and OK to start clears.  Stable-will recheck CBC in AM 1 Day Post-Op    LOS: 1 day   Matt B. Hassell Done, MD, [facility] [phone] beeper [phone]  12/08/2017 12:11 PM

## 2017-12-08 NOTE — Progress Notes (Signed)
Patient has Medicaid coverage for meds, Enoxaparin is part of the formulary and is covered at $3

## 2017-12-08 NOTE — Addendum Note (Signed)
Addendum  created 12/08/17 0911 by Lollie Sails, CRNA   Charge Capture section accepted

## 2017-12-09 LAB — CBC WITH DIFFERENTIAL/PLATELET
Basophils Absolute: 0 10*3/uL (ref 0.0–0.1)
Basophils Relative: 0 %
Eosinophils Absolute: 0 10*3/uL (ref 0.0–0.7)
Eosinophils Relative: 0 %
HCT: 35 % — ABNORMAL LOW (ref 36.0–46.0)
Hemoglobin: 11 g/dL — ABNORMAL LOW (ref 12.0–15.0)
Lymphocytes Relative: 17 %
Lymphs Abs: 2.6 10*3/uL (ref 0.7–4.0)
MCH: 26.5 pg (ref 26.0–34.0)
MCHC: 31.4 g/dL (ref 30.0–36.0)
MCV: 84.3 fL (ref 78.0–100.0)
Monocytes Absolute: 1.3 10*3/uL — ABNORMAL HIGH (ref 0.1–1.0)
Monocytes Relative: 9 %
Neutro Abs: 11 10*3/uL — ABNORMAL HIGH (ref 1.7–7.7)
Neutrophils Relative %: 74 %
Platelets: 412 10*3/uL — ABNORMAL HIGH (ref 150–400)
RBC: 4.15 MIL/uL (ref 3.87–5.11)
RDW: 15.3 % (ref 11.5–15.5)
WBC: 14.9 10*3/uL — ABNORMAL HIGH (ref 4.0–10.5)

## 2017-12-09 MED ORDER — ENOXAPARIN (LOVENOX) PATIENT EDUCATION KIT
PACK | Freq: Once | Status: AC
  Administered 2017-12-09: 13:00:00
  Filled 2017-12-09: qty 1

## 2017-12-09 NOTE — Progress Notes (Signed)
Instruction provided to patient regarding SQ injection of Lovenox. As she has injected herself with Lovenox before, she was allowed to give her heparin SQ. Patient gave her own heparin SQ correctly. Lovenox kit given to patient to take home. Donne Hazel, RN

## 2017-12-09 NOTE — Progress Notes (Addendum)
Patient educated to Lovenox by Pharmacy .  Ambulating, tolerating fluids.  Repeat labs in am for Hgb. Discussed plan of care to include ambulation, fluid intake, incentive spirometer.  Planned discharge education with patient and family for 830 am 12/10/17.  No questions at this time

## 2017-12-09 NOTE — Progress Notes (Signed)
Patient alert and oriented, Post op day 2.  Provided support and encouragement.  Encouraged pulmonary toilet, ambulation and small sips of liquids.  Completed 6 ounces of protein shake.  Family at bedside.  All questions answered.  Will continue to monitor.

## 2017-12-09 NOTE — Discharge Instructions (Signed)
Enoxaparin injection °What is this medicine? °ENOXAPARIN (ee nox a PA rin) is used after knee, hip, or abdominal surgeries to prevent blood clotting. It is also used to treat existing blood clots in the lungs or in the veins. °This medicine may be used for other purposes; ask your health care provider or pharmacist if you have questions. °COMMON BRAND NAME(S): Lovenox °What should I tell my health care provider before I take this medicine? °They need to know if you have any of these conditions: °-bleeding disorders, hemorrhage, or hemophilia °-infection of the heart or heart valves °-kidney or liver disease °-previous stroke °-prosthetic heart valve °-recent surgery or delivery of a baby °-ulcer in the stomach or intestine, diverticulitis, or other bowel disease °-an unusual or allergic reaction to enoxaparin, heparin, pork or pork products, other medicines, foods, dyes, or preservatives °-pregnant or trying to get pregnant °-breast-feeding °How should I use this medicine? °This medicine is for injection under the skin. It is usually given by a health-care professional. You or a family member may be trained on how to give the injections. If you are to give yourself injections, make sure you understand how to use the syringe, measure the dose if necessary, and give the injection. To avoid bruising, do not rub the site where this medicine has been injected. Do not take your medicine more often than directed. Do not stop taking except on the advice of your doctor or health care professional. °Make sure you receive a puncture-resistant container to dispose of the needles and syringes once you have finished with them. Do not reuse these items. Return the container to your doctor or health care professional for proper disposal. °Talk to your pediatrician regarding the use of this medicine in children. Special care may be needed. °Overdosage: If you think you have taken too much of this medicine contact a poison control  center or emergency room at once. °NOTE: This medicine is only for you. Do not share this medicine with others. °What if I miss a dose? °If you miss a dose, take it as soon as you can. If it is almost time for your next dose, take only that dose. Do not take double or extra doses. °What may interact with this medicine? °-aspirin and aspirin-like medicines °-certain medicines that treat or prevent blood clots °-dipyridamole °-NSAIDs, medicines for pain and inflammation, like ibuprofen or naproxen °This list may not describe all possible interactions. Give your health care provider a list of all the medicines, herbs, non-prescription drugs, or dietary supplements you use. Also tell them if you smoke, drink alcohol, or use illegal drugs. Some items may interact with your medicine. °What should I watch for while using this medicine? °Visit your doctor or health care professional for regular checks on your progress. Your condition will be monitored carefully while you are receiving this medicine. °Notify your doctor or health care professional and seek emergency treatment if you develop breathing problems; changes in vision; chest pain; severe, sudden headache; pain, swelling, warmth in the leg; trouble speaking; sudden numbness or weakness of the face, arm, or leg. These can be signs that your condition has gotten worse. °If you are going to have surgery, tell your doctor or health care professional that you are taking this medicine. °Do not stop taking this medicine without first talking to your doctor. Be sure to refill your prescription before you run out of medicine. °Avoid sports and activities that might cause injury while you are using this medicine. Severe   falls or injuries can cause unseen bleeding. Be careful when using sharp tools or knives. Consider using an electric razor. Take special care brushing or flossing your teeth. Report any injuries, bruising, or red spots on the skin to your doctor or health care  professional. °What side effects may I notice from receiving this medicine? °Side effects that you should report to your doctor or health care professional as soon as possible: °-allergic reactions like skin rash, itching or hives, swelling of the face, lips, or tongue °-feeling faint or lightheaded, falls °-signs and symptoms of bleeding such as bloody or black, tarry stools; red or dark-brown urine; spitting up blood or brown material that looks like coffee grounds; red spots on the skin; unusual bruising or bleeding from the eye, gums, or nose °Side effects that usually do not require medical attention (report to your doctor or health care professional if they continue or are bothersome): °-pain, redness, or irritation at site where injected °This list may not describe all possible side effects. Call your doctor for medical advice about side effects. You may report side effects to FDA at 1-800-FDA-1088. °Where should I keep my medicine? °Keep out of the reach of children. °Store at room temperature between 15 and 30 degrees C (59 and 86 degrees F). Do not freeze. If your injections have been specially prepared, you may need to store them in the refrigerator. Ask your pharmacist. Throw away any unused medicine after the expiration date. °NOTE: This sheet is a summary. It may not cover all possible information. If you have questions about this medicine, talk to your doctor, pharmacist, or health care provider. °© 2018 Elsevier/Gold Standard (2013-08-22 16:06:21) ° ° ° ° °GASTRIC BYPASS/SLEEVE ° Home Care Instructions ° ° These instructions are to help you care for yourself when you go home. ° °Call: If you have any problems. °• Call [phone] and ask for the surgeon on call °• If you need immediate help, come to the ER at [facility].  °• Tell the ER staff that you are a new post-op gastric bypass or gastric sleeve patient °  °Signs and symptoms to report: • Severe vomiting or nausea °o If you cannot keep down  clear liquids for longer than 1 day, call your surgeon  °• Abdominal pain that does not get better after taking your pain medication °• Fever over 100.4° F with chills °• Heart beating over 100 beats a minute °• Shortness of breath at rest °• Chest pain °•  Redness, swelling, drainage, or foul odor at incision (surgical) sites °•  If your incisions open or pull apart °• Swelling or pain in calf (lower leg) °• Diarrhea (Loose bowel movements that happen often), frequent watery, uncontrolled bowel movements °• Constipation, (no bowel movements for 3 days) if this happens: Pick one °o Milk of Magnesia, 2 tablespoons by mouth, 3 times a day for 2 days if needed °o Stop taking Milk of Magnesia once you have a bowel movement °o Call your doctor if constipation continues °Or °o Miralax  (instead of Milk of Magnesia) following the label instructions °o Stop taking Miralax once you have a bowel movement °o Call your doctor if constipation continues °• Anything you think is not normal °  °Normal side effects after surgery: • Unable to sleep at night or unable to focus °• Irritability or moody °• Being tearful (crying) or depressed °These are common complaints, possibly related to your anesthesia medications that put you to sleep, stress of surgery,   and change in lifestyle.  This usually goes away a few weeks after surgery.  If these feelings continue, call your primary care doctor. °  °Wound Care: You may have surgical glue, steri-strips, or staples over your incisions after surgery °• Surgical glue:  Looks like a clear film over your incisions and will wear off a little at a time °• Steri-strips: Strips of tape over your incisions. You may notice a yellowish color on the skin under the steri-strips. This is used to make the   steri-strips stick better. Do not pull the steri-strips off - let them fall off °• Staples: Staples may be removed before you leave the hospital °o If you go home with staples, call [facility], [phone] at for an appointment with your surgeon’s nurse to have staples removed 10 days after surgery. °• Showering: You may shower two (2) days after your surgery unless your surgeon tells you differently °o Wash gently around incisions with warm soapy water, rinse well, and gently pat dry  °o No tub baths until staples are removed, steri-strips fall off or glue is gone.  °  °Medications: • Medications should be liquid or crushed if larger than the size of a dime °• Extended release pills (medication that release a little bit at a time through the day) should NOT be crushed or cut. (examples include XL, ER, DR, SR) °• Depending on the size and number of medications you take, you may need to space (take a few throughout the day)/change the time you take your medications so that you do not over-fill your pouch (smaller stomach) °• Make sure you follow-up with your primary care doctor to make medication changes needed during rapid weight loss and life-style changes °• If you have diabetes, follow up with the doctor that orders your diabetes medication(s) within one week after surgery and check your blood sugar regularly. °• Do not drive while taking prescription pain medication  °• It is ok to take Tylenol by the bottle instructions with your pain medicine or instead of your pain medicine as needed.  DO NOT TAKE NSAIDS (EXAMPLES OF NSAIDS:  IBUPROFREN/ NAPROXEN)  °Diet:                    First 2 Weeks ° You will see the dietician t about two (2) weeks after your surgery. The dietician will increase the types of foods you can eat if you are handling liquids well: °• If you have severe vomiting or nausea and cannot keep down clear liquids lasting longer than 1 day, call your surgeon @ ([phone]) °Protein Shake °• Drink at least 2 ounces of shake 5-6 times per day °• Each serving of protein shakes (usually 8 - 12 ounces) should have: °o 15 grams of protein  °o And no more than 5 grams of carbohydrate   °• Goal for protein each day: °o Men = 80 grams per day °o Women = 60 grams per day °• Protein powder may be added to fluids such as non-fat milk or Lactaid milk or unsweetened Soy/Almond milk (limit to 35 grams added protein powder per serving) ° °Hydration °• Slowly increase the amount of water and other clear liquids as tolerated (See Acceptable Fluids) °• Slowly increase the amount of protein shake as tolerated  °•  Sip fluids slowly and throughout the day.  Do not use straws. °• May use sugar substitutes in small amounts (no more than 6 - 8 packets per   day; i.e. Splenda) ° °Fluid Goal °• The first goal is to drink at least 8 ounces of protein shake/drink per day (or as directed by the nutritionist); some examples of protein shakes are Syntrax Nectar, Adkins Advantage, EAS Edge HP, and Unjury. See handout from pre-op Bariatric Education Class: °o Slowly increase the amount of protein shake you drink as tolerated °o You may find it easier to slowly sip shakes throughout the day °o It is important to get your proteins in first °• Your fluid goal is to drink 64 - 100 ounces of fluid daily °o It may take a few weeks to build up to this °• 32 oz (or more) should be clear liquids  °And  °• 32 oz (or more) should be full liquids (see below for examples) °• Liquids should not contain sugar, caffeine, or carbonation ° °Clear Liquids: °• Water or Sugar-free flavored water (i.e. Fruit H2O, Propel) °• Decaffeinated coffee or tea (sugar-free) °• Crystal Lite, Wyler’s Lite, Minute Maid Lite °• Sugar-free Jell-O °• Bouillon or broth °• Sugar-free Popsicle:   *Less than 20 calories each; Limit 1 per day ° °Full Liquids: °Protein Shakes/Drinks + 2 choices per day of other full liquids °• Full liquids must be: °o No More Than 15 grams of Carbs per serving  °o No More Than 3 grams of Fat per serving °• Strained low-fat cream soup (except Cream of Potato or Tomato) °• Non-Fat milk °• Fat-free Lactaid Milk °• Unsweetened Soy Or  Unsweetened Almond Milk °• Low Sugar yogurt (Dannon Lite & Fit, Greek yogurt; Oikos Triple Zero; Chobani Simply 100; Yoplait 100 calorie Greek - No Fruit on the Bottom) ° °  °Vitamins and Minerals • Start 1 day after surgery unless otherwise directed by your surgeon °• 2 Chewable Bariatric Specific Multivitamin / Multimineral Supplement with iron (Example: Bariatric Advantage Multi EA) °• Chewable Calcium with Vitamin D-3 °(Example: 3 Chewable Calcium Plus 600 with Vitamin D-3) °o Take 500 mg three (3) times a day for a total of 1500 mg each day °o Do not take all 3 doses of calcium at one time as it may cause constipation, and you can only absorb 500 mg  at a time  °o Do not mix multivitamins containing iron with calcium supplements; take 2 hours apart °• Menstruating women and those with a history of anemia (a blood disease that causes weakness) may need extra iron °o Talk with your doctor to see if you need more iron °• Do not stop taking or change any vitamins or minerals until you talk to your dietitian or surgeon °• Your Dietitian and/or surgeon must approve all vitamin and mineral supplements °  °Activity and Exercise: Limit your physical activity as instructed by your doctor.  It is important to continue walking at home.  During this time, use these guidelines: °• Do not lift anything greater than ten (10) pounds for at least two (2) weeks °• Do not go back to work or drive until your surgeon says you can °• You may have sex when you feel comfortable  °o It is VERY important for female patients to use a reliable birth control method; fertility often increases after surgery  °o All hormonal birth control will be ineffective for 30 days after surgery due to medications given during surgery a barrier method must be used. °o Do not get pregnant for at least 18 months °• Start exercising as soon as your doctor tells you that you can °o Make sure   your doctor approves any physical activity °• Start with a simple  walking program °• Walk 5-15 minutes each day, 7 days per week.  °• Slowly increase until you are walking 30-45 minutes per day °Consider joining our BELT program. [phone] or email belt@[employer] °  °Special Instructions Things to remember: °• Use your CPAP when sleeping if this applies to you ° °• [facility] has two free Bariatric Surgery Support Groups that meet monthly °o The 3rd Thursday of each month, 6 pm, [facility]  °o The 2nd Friday of each month, 11:45 am in the private dining room in the basement of [facility] °• It is very important to keep all follow up appointments with your surgeon, dietitian, primary care physician, and behavioral health practitioner °• Routine follow up schedule with your surgeon include appointments at 2-3 weeks, 6-8 weeks, 6 months, and 1 year at a minimum.  Your surgeon may request to see you more often.   °o After the first year, please follow up with your bariatric surgeon and dietitian at least once a year in order to maintain best weight loss results °[facility]: [phone] °[facility]: [phone] °Bariatric Nurse Coordinator: [phone] °  °   Reviewed and Endorsed  °by [facility] Patient Education Committee, June, 2016 °Edits Approved: Aug, 2018 ° ° ° °

## 2017-12-09 NOTE — Progress Notes (Addendum)
PHARMACY NOTE:  POST-DISCHARGE VTE PROPHYLAXIS PLANNING  POD#2  Alert, conversant  Receiving Heparin 5000 units SQ q8h while inpatient  Hgb 11.0 Hct  35.0 Pltc 412K  SCr 0.86 Est CrCl >100 mL/min  No bleeding reported.  Lovenox teaching kit was obtained and provided by Bariatric Nurse Coordinator RN.  Patient states she has prior experience with self-administration of Lovenox.  Confirmed with [facility] that they have enoxaparin 60 mg syringes in stock and will accept patient's insurance coverage (Medicaid);  Estimated co-pay $3 per Care Management review.  Reviewed this with patient and provided flyer for [facility].  Emphasized that she is welcome to get her enoxaparin prescription filled at the pharmacy of her choice, but not all retail pharmacies stock enoxaparin, and a delay in obtaining the medication upon discharge could increase her risk for blood clots.  Reminded patient not to resume Xarelto while on enoxaparin.   Questions regarding the recommended enoxaparin prophylaxis course were answered.     Recommendation for Discharge: Enoxaparin 60 mg [place] q12h x 4 weeks post-discharge Follow up with PCP thereafter to consider whether to resume chronic anticoagulation (see office note from 11/10/17).  Clayburn Pert, PharmD, BCPS [phone] 12/09/2017  3:20 PM

## 2017-12-09 NOTE — Progress Notes (Signed)
Patient ID: Crystal Garza, female   DOB: 08-15-1970, 47 y.o.   MRN: [id number] [facility] Progress Note:   2 Days Post-Op  Subjective: Mental status is clear;  Feeling better-has just begun shakes Objective: Vital signs in last 24 hours: Temp:  [97.8 F (36.6 C)-99.1 F (37.3 C)] 98.6 F (37 C) (08/08 0955) Pulse Rate:  [72-84] 81 (08/08 0955) Resp:  [15-22] 19 (08/08 0955) BP: (111-125)/(51-64) 117/62 (08/08 0955) SpO2:  [95 %-100 %] 98 % (08/08 0955) Weight:  [164.2 kg] 164.2 kg (08/08 0455)  Intake/Output from previous day: 08/07 0701 - 08/08 0700 In: 1748.6 [P.O.:300; I.V.:1448.6] Out: 1125 [Urine:1125] Intake/Output this shift: Total I/O In: 1330.7 [P.O.:240; I.V.:986.9; IV Piggyback:103.8] Out: 550 [Urine:550]  Physical Exam: Work of breathing is normal;  Abdomen is sore  Lab Results:  Results for orders placed or performed during the hospital encounter of 12/07/17 (from the past 48 hour(s))  Hemoglobin and hematocrit, blood     Status: None   Collection Time: 12/07/17  7:26 PM  Result Value Ref Range   Hemoglobin 13.0 12.0 - 15.0 g/dL   HCT 41.0 36.0 - 46.0 %    Comment: Performed at [facility], [address] [place], [place] [address]  CBC WITH DIFFERENTIAL     Status: Abnormal   Collection Time: 12/08/17  4:58 AM  Result Value Ref Range   WBC 21.3 (H) 4.0 - 10.5 K/uL   RBC 4.88 3.87 - 5.11 MIL/uL   Hemoglobin 12.6 12.0 - 15.0 g/dL   HCT 40.6 36.0 - 46.0 %   MCV 83.2 78.0 - 100.0 fL   MCH 25.8 (L) 26.0 - 34.0 pg   MCHC 31.0 30.0 - 36.0 g/dL   RDW 15.1 11.5 - 15.5 %   Platelets 500 (H) 150 - 400 K/uL   Neutrophils Relative % 88 %   Neutro Abs 18.6 (H) 1.7 - 7.7 K/uL   Lymphocytes Relative 7 %   Lymphs Abs 1.6 0.7 - 4.0 K/uL   Monocytes Relative 5 %   Monocytes Absolute 1.1 (H) 0.1 - 1.0 K/uL   Eosinophils Relative 0 %   Eosinophils Absolute 0.0 0.0 - 0.7 K/uL   Basophils Relative 0 %   Basophils Absolute 0.0 0.0 - 0.1  K/uL    Comment: Performed at [facility], [place] [address], [place], [place] [address]  CBC with Differential     Status: Abnormal   Collection Time: 12/09/17  4:48 AM  Result Value Ref Range   WBC 14.9 (H) 4.0 - 10.5 K/uL   RBC 4.15 3.87 - 5.11 MIL/uL   Hemoglobin 11.0 (L) 12.0 - 15.0 g/dL   HCT 35.0 (L) 36.0 - 46.0 %   MCV 84.3 78.0 - 100.0 fL   MCH 26.5 26.0 - 34.0 pg   MCHC 31.4 30.0 - 36.0 g/dL   RDW 15.3 11.5 - 15.5 %   Platelets 412 (H) 150 - 400 K/uL   Neutrophils Relative % 74 %   Neutro Abs 11.0 (H) 1.7 - 7.7 K/uL   Lymphocytes Relative 17 %   Lymphs Abs 2.6 0.7 - 4.0 K/uL   Monocytes Relative 9 %   Monocytes Absolute 1.3 (H) 0.1 - 1.0 K/uL   Eosinophils Relative 0 %   Eosinophils Absolute 0.0 0.0 - 0.7 K/uL   Basophils Relative 0 %   Basophils Absolute 0.0 0.0 - 0.1 K/uL    Comment: Performed at [facility], [address], [place] [address]    Radiology/Results: Scherrie Merritts W/water Sol Cm  Result Date: 12/08/2017 CLINICAL DATA:  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 EXAM: WATER SOLUBLE UPPER GI SERIES TECHNIQUE: Single-column upper GI series was performed using water soluble contrast. CONTRAST:  33mL ISOVUE-300 IOPAMIDOL (ISOVUE-300) INJECTION 61% COMPARISON:  None. FLUOROSCOPY TIME:  Fluoroscopy Time:  0.8 minutes Radiation Exposure Index (if provided by the fluoroscopic device): 90.7 mGy FINDINGS: The patient ingested 50 cc of water-soluble contrast under direct fluoroscopic visualization. Contrast passes through the esophagus into the upper stomach and into the proximal jejunum without delay. No evidence of leakage or obstruction. IMPRESSION: Satisfactory appearance of the Roux-en-Y gastric bypass as described. Electronically Signed   By: Lorriane Shire M.D.   On: 12/08/2017 10:01    Anti-infectives: Anti-infectives (From admission, onward)   Start     Dose/Rate Route Frequency  Ordered Stop   12/07/17 0730  cefoTEtan (CEFOTAN) 2 g in sodium chloride 0.9 % 100 mL IVPB     2 g 200 mL/hr over 30 Minutes Intravenous On call to O.R. 12/07/17 0716 12/07/17 1034      Assessment/Plan: Problem List: Patient Active Problem List   Diagnosis Date Noted  . 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 History of pulmonary embolism 05/25/2017  . Angina, class III ([place]) 05/25/2017  . Symptoms of gastroesophageal reflux 11/09/2016  . Esophageal dysphagia   . Nausea without vomiting   . Family history of colon cancer   . Dizziness 11/08/2015  . 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 Chronic neck and back pain 02/27/2015  . Other chest pain 12/10/2014  . Medication management 08/22/2014  . Hematuria 08/22/2014  . Pain in joint, ankle and foot 05/23/2014  . Chronic anticoagulation 05/17/2014  . Chronic thoracic back pain 04/11/2014  . Right flank pain 01/17/2014  . Degenerative arthritis of left knee 10/06/2013  . Status post total knee replacement 10/06/2013  . Edema 07/28/2013  . Chronic pain 06/13/2013  . 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 Bipolar disorder ([place]) 01/19/2012  . Nephrolithiasis 02/20/2011  . Eczematous dermatitis 10/22/2010  . Iron deficiency anemia 06/27/2010  . Anxiety 06/27/2010  . Migraine 09/12/2009  . Morbid obesity-BMI 62 10/30/2008  . Osteoarthrosis, unspecified whether generalized or localized, involving lower leg 07/05/2007  . DEPRESSION, MAJOR, RECURRENT 07/01/2006  . HYPERTENSION, BENIGN SYSTEMIC 07/01/2006  . RHINITIS, ALLERGIC 07/01/2006  . Gastroesophageal reflux disease 07/01/2006    Not quite ready for discharge;  Will arrange for home Lovenox;  Plan discharge tomorrow 2 Days Post-Op    LOS: 2 days   Matt B. Hassell Done, MD, [facility] [phone] beeper [phone]  12/09/2017 1:41 PM

## 2017-12-10 ENCOUNTER — Other Ambulatory Visit: Payer: Self-pay | Admitting: Family Medicine

## 2017-12-10 LAB — CBC
HCT: 34.7 % — ABNORMAL LOW (ref 36.0–46.0)
Hemoglobin: 11.1 g/dL — ABNORMAL LOW (ref 12.0–15.0)
MCH: 26.1 pg (ref 26.0–34.0)
MCHC: 32 g/dL (ref 30.0–36.0)
MCV: 81.5 fL (ref 78.0–100.0)
Platelets: 420 10*3/uL — ABNORMAL HIGH (ref 150–400)
RBC: 4.26 MIL/uL (ref 3.87–5.11)
RDW: 15.3 % (ref 11.5–15.5)
WBC: 12.8 10*3/uL — ABNORMAL HIGH (ref 4.0–10.5)

## 2017-12-10 MED ORDER — ONDANSETRON 4 MG PO TBDP: 4.0000 mg | ORAL_TABLET | Freq: Four times a day (QID) | ORAL | 0 refills | Status: DC | PRN

## 2017-12-10 MED ORDER — ENOXAPARIN SODIUM 60 MG/0.6ML ~~LOC~~ SOLN: 60.0000 mg | Freq: Two times a day (BID) | SUBCUTANEOUS | 0 refills | Status: DC

## 2017-12-10 MED ORDER — PANTOPRAZOLE SODIUM 40 MG PO TBEC: 40.0000 mg | DELAYED_RELEASE_TABLET | Freq: Every day | ORAL | 0 refills | Status: DC

## 2017-12-10 NOTE — Care Management Note (Signed)
Case Management Note  Patient Details  Name: Crystal Garza MRN: [id number] Date of Birth: Oct 06, 1970  Subjective/Objective:       Discharged to home             Action/Plan: no cm needs present at dc   Expected Discharge Date:  12/10/17               Expected Discharge Plan:  Home/Self Care  In-House Referral:     Discharge planning Services  CM Consult  Post Acute Care Choice:    Choice offered to:     DME Arranged:    DME Agency:     HH Arranged:    [place] Agency:     Status of Service:  Completed, signed off  If discussed at [employer] of Stay Meetings, dates discussed:    Additional Comments:  Leeroy Cha, RN 12/10/2017, 2:37 PM

## 2017-12-10 NOTE — Telephone Encounter (Signed)
Dear [name] Team I have scheduled her in a special slot on the 21st of this month at 9:00 Am Wednesday Please let her know THANKS! [name]

## 2017-12-10 NOTE — Progress Notes (Signed)
Discharge and medication instructions reviewed with patient. Questions answered and patient denies further questions. Family members here to drive patient home. No prescriptions given to patient. Donne Hazel, RN

## 2017-12-10 NOTE — Discharge Summary (Signed)
Physician Discharge Summary  Patient ID: Crystal Garza MRN: [id number] DOB/AGE: [birth year] 47 y.o.  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  Diet advanced slowly.  Will discharge on 4 weeks Lovenox BID for history of PE-then to resume Xarelto  Consults: none  Significant Diagnostic Studies: UGI    Discharge Exam: Blood pressure 126/72, pulse 78, temperature 98.2 F (36.8 C), temperature source Oral, resp. rate 18, height 5\' 5"  (1.651 m), weight (!) 165.7 kg, SpO2 100 %. Incisions OK  Disposition: Discharge disposition: 01-Home or Self Care       Discharge Instructions    Ambulate hourly while awake   Complete by:  As directed    Call MD for:  difficulty breathing, headache or visual disturbances   Complete by:  As directed    Call MD for:  persistant dizziness or light-headedness   Complete by:  As directed    Call MD for:  persistant nausea and vomiting   Complete by:  As directed    Call MD for:  redness, tenderness, or signs of infection (pain, swelling, redness, odor or green/yellow discharge around incision site)   Complete by:  As directed    Call MD for:  severe uncontrolled pain   Complete by:  As directed    Call MD for:  temperature >101 F   Complete by:  As directed    Diet bariatric full liquid   Complete by:  As directed    Incentive spirometry   Complete by:  As directed    Perform hourly while awake     Allergies as of 12/10/2017      Reactions   Codeine Rash   Tuna [fish Allergy] Rash      Medication List    STOP taking these medications   aspirin EC 81 MG tablet   DEXILANT 60 MG  capsule Generic drug:  dexlansoprazole   oxyCODONE-acetaminophen 10-325 MG tablet Commonly known as:  PERCOCET   XARELTO 20 MG Tabs tablet Generic drug:  rivaroxaban     TAKE these medications   ALPRAZolam 1 MG tablet Commonly known as:  XANAX TAKE (1) TABLET TWICE A DAY AS NEEDED. What changed:  See the new instructions.   AMBULATORY NON FORMULARY MEDICATION Medication Name: FDgard: use as directed for dyspepsia   desipramine 10 MG tablet Commonly known as:  NOPRAMIN Take 2 tablets (20 mg total) by mouth at bedtime.   diltiazem 120 MG 24 hr capsule Commonly known as:  CARDIZEM CD Take 1 capsule (120 mg total) by mouth daily. Notes to patient:  Monitor Blood Pressure Daily and keep a log for primary care physician.  You may need to make changes to your medications with rapid weight loss.     enoxaparin 60 MG/0.6ML injection Commonly known as:  LOVENOX Inject 0.6 mLs (60 mg total) into the skin 2 (two) times daily for 28 days.   lamoTRIgine 200 MG tablet Commonly known as:  LAMICTAL Take 1 tablet (200 mg total) by mouth at bedtime.   lisinopril 10 MG tablet Commonly known as:  PRINIVIL,ZESTRIL Take 1 tablet (10 mg  total) by mouth daily. Notes to patient:  Monitor Blood Pressure Daily and keep a log for primary care physician.  You may need to make changes to your medications with rapid weight loss.     ondansetron 4 MG disintegrating tablet Commonly known as:  ZOFRAN-ODT Take 4 mg by mouth every 8 (eight) hours as needed for nausea or vomiting. What changed:  Another medication with the same name was added. Make sure you understand how and when to take each.   ondansetron 4 MG disintegrating tablet Commonly known as:  ZOFRAN-ODT Take 1 tablet (4 mg total) by mouth every 6 (six) hours as needed for nausea or vomiting. What changed:  You were already taking a medication with the same name, and this prescription was added. Make sure you understand how and when to take  each.   pantoprazole 40 MG tablet Commonly known as:  PROTONIX Take 1 tablet (40 mg total) by mouth daily.   risperiDONE 1 MG tablet Commonly known as:  RISPERDAL Take 1 tablet (1 mg total) by mouth at bedtime.   tiZANidine 4 MG tablet Commonly known as:  ZANAFLEX Take 1 tablet (4 mg total) by mouth 2 (two) times daily as needed for muscle spasms.      Follow-up Information    [facility], [place]. Go on 12/29/2017.   Specialty:  General Surgery Why:  at 930 with Dr Kaylyn Lim Contact information: [place] [address] [phone]        Carlena Hurl, PA-C .   Specialty:  General Surgery Contact information: [place] [address] [phone]           Signed: [name] 12/10/2017, 9:17 AM

## 2017-12-10 NOTE — Telephone Encounter (Signed)
Pt called to schedule a follow up appointment with Dr. Nori Riis for her bp and to discuss her medications and refills. I offered her the next available which is 01/12/18 but she said she is not able to wait that long. I offered her to be seen another provider for her blood pressure but she wants to only see Dr. Nori Riis. She would like for Dr. Nori Riis to give her a call.

## 2017-12-10 NOTE — Telephone Encounter (Signed)
Pt informed. Kaito Schulenburg Dawn, CMA  

## 2017-12-10 NOTE — Progress Notes (Signed)
Patient alert and oriented, pain is controlled. Patient is tolerating fluids, advanced to protein shake today, patient is tolerating well. Reviewed Gastric Bypass discharge instructions with patient and patient is able to articulate understanding. Provided information on BELT program, Support Group and [facility] outpatient pharmacy. All questions answered, will continue to monitor.  24 hour fluid intake 1060 Per dehydration protocol call back one week post op

## 2017-12-13 ENCOUNTER — Telehealth (HOSPITAL_COMMUNITY): Payer: Self-pay

## 2017-12-13 NOTE — Telephone Encounter (Signed)
Patient called to discuss post bariatric surgery follow up questions.  See below:   1.  Tell me about your pain and pain management?taking prescribed medication when out all day medication works when used.  Discussed trying to try Tylenol first    2.  Let's talk about fluid intake.  How much total fluid are you taking in?38-42 ounces of fluid  3.  How much protein have you taken in the last 2 days?45 grams of protein  4.  Have you had nausea?  Tell me about when have experienced nausea and what you did to help?no nausea  5.  Has the frequency or color changed with your urine?frequent urination light in color  6.  Tell me what your incisions look like?no problems look good showering no problems  7.  Have you been passing gas? BM?passing gas and having bms  8.  If a problem or question were to arise who would you call?  Do you know contact numbers for [facility], and [facility]?aware of how to contact services  9.  How has the walking going?Walking frequently at home, and out and about with family  10.  How are your vitamins and calcium going?  How are you taking them?Taking MVI x2 per day and calcium X3  As scheduled  No questions taking Lovenox as prescribed.  Needs to pick up remaining medication as pharmacy only had 7 injections.  Patient on way now to get medication

## 2017-12-15 ENCOUNTER — Other Ambulatory Visit: Payer: Self-pay | Admitting: Family Medicine

## 2017-12-16 ENCOUNTER — Encounter: Payer: Self-pay | Admitting: Family Medicine

## 2017-12-17 NOTE — Telephone Encounter (Signed)
Pharmacy called to check status. Patient has called there to ask if received.  Danley Danker, RN [facility] RN)

## 2017-12-17 NOTE — Telephone Encounter (Signed)
Pt called in reference to her pain medication.  She said that it was supposed to have been sent on 7/17 or 7/18. I do see in a mychart message that you said you sent it but then I just saw this opened phone message and it had never been signed. Please advise. She said it was supposed to have 3 refills also. Katharina Caper, Danely Bayliss D, [place]

## 2017-12-17 NOTE — Telephone Encounter (Signed)
Called in one m rx and talked w pt Crystal Garza

## 2017-12-21 ENCOUNTER — Encounter: Payer: Medicare Other | Attending: Surgery | Admitting: Skilled Nursing Facility1

## 2017-12-21 DIAGNOSIS — Z9884 Bariatric surgery status: Secondary | ICD-10-CM | POA: Insufficient documentation

## 2017-12-21 DIAGNOSIS — Z713 Dietary counseling and surveillance: Secondary | ICD-10-CM | POA: Insufficient documentation

## 2017-12-22 ENCOUNTER — Ambulatory Visit: Payer: Self-pay | Admitting: Family Medicine

## 2017-12-23 ENCOUNTER — Encounter: Payer: Self-pay | Admitting: Skilled Nursing Facility1

## 2017-12-23 NOTE — Progress Notes (Signed)
Bariatric Class:  Appt start time: 1530 end time:  1630.  2 Week Post-Operative Nutrition Class  Patient was seen on 12/21/2017 for Post-Operative Nutrition education at [facility].   Pt states she has been having trouble meeting her needs and states she thinks it is getting better.  Surgery date: 11/23/2017 Surgery type: Sleeve conversion to RYGB Start weight at [facility]: 348.4 Weight today: pt arrived too late   The following the learning objectives were met by the patient during this course:  Identifies Phase 3A (Soft, High Proteins) Dietary Goals and will begin from 2 weeks post-operatively to 2 months post-operatively  Identifies appropriate sources of fluids and proteins   States protein recommendations and appropriate sources post-operatively  Identifies the need for appropriate texture modifications, mastication, and bite sizes when consuming solids  Identifies appropriate multivitamin and calcium sources post-operatively  Describes the need for physical activity post-operatively and will follow MD recommendations  States when to call healthcare provider regarding medication questions or post-operative complications  Handouts given during class include:  Phase 3A: Soft, High Protein Diet Handout  Follow-Up Plan: Patient will follow-up at [facility] in 6 weeks for 2 month post-op nutrition visit for diet advancement per MD.

## 2017-12-27 ENCOUNTER — Telehealth: Payer: Self-pay | Admitting: Skilled Nursing Facility1

## 2017-12-27 NOTE — Telephone Encounter (Signed)
Dietitian called pt to check in on diet progression/toelrance.  Pt states she threw up with boiled eggs and flounder stating these items were dry. Pt states eating ice is fine but trying to drink makes her too full. Pt states she has been drinking water, unsweet tea, protein shakes, Gatorade zero. Pt states she is drinking 2.5 protein shakes a day. Pt states she is not eating because she is just not hungry. Pt state she can eat pinto beans and cheese without issue. Pt states she has been drinking 32 ounces of fluid is not including protein shakes.   Dietitian advised she try to eat somthing 3 times a day and work on getting in 64 fluid ounces.  Pt state she is really struggling with trying to figure out a schedule of how to get everything in.  Dietitian advised she call [facility] if she feels dizzy, lightheaded.   Dietitian set her up with the earliest appt she could come in which is September 10th at 2pm.

## 2018-01-11 ENCOUNTER — Encounter: Payer: Medicare Other | Attending: Surgery | Admitting: Registered"

## 2018-01-11 ENCOUNTER — Other Ambulatory Visit: Payer: Self-pay

## 2018-01-11 ENCOUNTER — Encounter: Payer: Self-pay | Admitting: Registered"

## 2018-01-11 ENCOUNTER — Encounter: Payer: Self-pay | Admitting: Family Medicine

## 2018-01-11 ENCOUNTER — Ambulatory Visit (INDEPENDENT_AMBULATORY_CARE_PROVIDER_SITE_OTHER): Payer: Medicare Other | Admitting: Family Medicine

## 2018-01-11 DIAGNOSIS — E669 Obesity, unspecified: Secondary | ICD-10-CM

## 2018-01-11 DIAGNOSIS — Z9884 Bariatric surgery status: Secondary | ICD-10-CM | POA: Insufficient documentation

## 2018-01-11 DIAGNOSIS — Z7901 Long term (current) use of anticoagulants: Secondary | ICD-10-CM

## 2018-01-11 DIAGNOSIS — Z713 Dietary counseling and surveillance: Secondary | ICD-10-CM | POA: Diagnosis not present

## 2018-01-11 MED ORDER — RIVAROXABAN 10 MG PO TABS: 10.0000 mg | ORAL_TABLET | Freq: Every day | ORAL | 3 refills | Status: DC

## 2018-01-11 MED ORDER — OXYCODONE-ACETAMINOPHEN 10-325 MG PO TABS: ORAL_TABLET | ORAL | 0 refills | Status: DC

## 2018-01-11 NOTE — Progress Notes (Signed)
Follow-up visit:  8 Weeks Post-Operative Sleeve conversion to RYGB Surgery  Medical Nutrition Therapy:  Appt start time: 2:00 end time:  2:45.  Primary concerns today: Post-operative Bariatric Surgery Nutrition Management.  Non scale victories: none stated  Pt states she has been having trouble meeting her needs and states she thinks it is getting better.  Surgery date: 11/23/2017 Surgery type: Sleeve conversion to RYGB Start weight at [facility]: 348.4 Weight today: wt not taken Weight change: N/A due to pt arriving too late to post-op class Total weight lost: N/A Weight loss goal: help with GERD   TANITA  BODY COMP RESULTS  01/11/2018   BMI (kg/m^2) n/a   Fat Mass (lbs)    Fat Free Mass (lbs)    Total Body Water (lbs)    Pt states she has been taking care of granddaughter who has sickle cell anemia. Pt states recently her granddaughter has been in and out of the hospital in [place]. Pt states she was unprepared when staying in [place] for 4-5 days. Pt states she was depending on food provided by hospital and recently sometimes she is able to keep food down. Pt states she has noticed that change within the last few days. Pt states she is not tolerating cheese well now. Pt states she is having bowel movements daily.   Pt states she typically works weekends, Fri-Sun 8am-9pm.   Preferred Learning Style:   No preference indicated   Learning Readiness:   Contemplating  Ready  Change in progress  24-hr recall: B (AM): 1/2 protein shake (15g) egg doesn't stay down Snk (AM): none  L (PM): [employer]-1/2 bean bowl (5g) + 1/2 protein shake (15g) Snk (PM): none  D (PM): [employer]-1/2 bean bowl (5g) Snk (PM): none  Fluid intake: G2 (12 oz), protein shake (11 oz), water (16 oz), ice; about 40 ounces Estimated total protein intake: ~40 grams  Medications: See list Supplementation: 3 calcium supplements  Using straws: no Drinking while eating: no Having you been chewing well:  yes Chewing/swallowing difficulties: sometimes feels like things are getting stuck Changes in vision: no Changes to mood/headaches: yes Hair loss/Changes to skin/Changes to nails: no, no, no Any difficulty focusing or concentrating: no Sweating: no Dizziness/Lightheaded: no Palpitations: normal  Carbonated beverages: no N/V/D/C/GAS: yes, no, no, no, no Abdominal Pain: no Dumping syndrome: when eating peppermints Last Lap-Band fill: N/A  Recent physical activity:  Walking 1 mile, 7 days/week  Progress Towards Goal(s):  In progress.  Handouts given during visit include:  none   Nutritional Diagnosis:  [place]-3.3 Overweight/obesity related to past poor dietary habits and physical inactivity as evidenced by patient w/ recent RYGB surgery following dietary guidelines for continued weight loss.     Intervention:  Nutrition education and counseling. Pt was educated and counseled on ways to increase protein intake, importance of bariatric multivitamins, and discussed ways to plan ahead. Pt was in agreement with goals listed.  Goals: - Increase fluid intake. Add at least another 8 ounce bottle of water.  - Aim for at least 64 ounces of fluids.  - Plan ahead. Pack lunch bag and keep fridge (protein shakes, deli meat, cheese, greek yogurt, salmon packs). - Order bariatric multivitamins. - Try greek yogurt. Read labels keeping fat and sugar in single digits.  - Aiming for 60 grams of protein.  - Contact mental health professional.   Teaching Method Utilized:  Visual Auditory Hands on  Barriers to learning/adherence to lifestyle change: work-life balance  Demonstrated degree of  understanding via:  Teach Back   Monitoring/Evaluation:  Dietary intake, exercise, lap band fills, and body weight. Follow up in 2 weeks for 10 week post-op visit.

## 2018-01-11 NOTE — Patient Instructions (Addendum)
-   Increase fluid intake. Add at least another 8 ounce bottle of water.   - Aim for at least 64 ounces of fluids.   - Plan ahead. Pack lunch bag and keep fridge (protein shakes, deli meat, cheese, greek yogurt, salmon packs).  - Order bariatric multivitamins.  - Try greek yogurt. Read labels keeping fat and sugar in single digits.   - Aiming for 60 grams of protein.   - Contact mental health professional.

## 2018-01-12 ENCOUNTER — Encounter: Payer: Self-pay | Admitting: Family Medicine

## 2018-01-12 NOTE — Assessment & Plan Note (Signed)
Status post gastric bypass at this point.  She is still struggling with the postop nausea and vomiting.  She has successfully lost about 30 pounds already.  We discussed at length.  Encouraged her through this difficult time where she has a lot of nausea.  Greater than 50% of 25-minute office visit was spent in counseling and education regarding these issues.

## 2018-01-12 NOTE — Progress Notes (Signed)
    CHIEF COMPLAINT / HPI: Follow-up after gastric bypass for discussion about chronic anticoagulation. 2.  Having a lot of nausea and emesis post op.  Feeling pretty frustrated by all this.  REVIEW OF SYSTEMS: Nausea vomiting as per HPI.  She has lost about 27 to 30 pounds.  She does not have abdominal pain.  She is having a lot of stressors with the health of her granddaughter who was sickle cell disease and has been in and out of the hospital quite a bit recently  PERTINENT  PMH / [facility]: I have reviewed the patient's medications, allergies, past medical and surgical history, smoking status and updated in the EMR as appropriate.   OBJECTIVE: GENERAL: Well-developed obese female no acute distress PSYCH: Alert and oriented x4.  Affect is interactive.  Speech is normal in fluency and content.  No agitation.  No psychomotor retardation.  Judgment is intact.  Memory recent and remote is intact. ABDOMEN: Soft. EXTREMITY: No visible edema noted.  ASSESSMENT / PLAN:  Morbid obesity-BMI 67 Status post gastric bypass at this point.  She is still struggling with the postop nausea and vomiting.  She has successfully lost about 30 pounds already.  We discussed at length.  Encouraged her through this difficult time where she has a lot of nausea.  Greater than 50% of 25-minute office visit was spent in counseling and education regarding these issues.  Chronic anticoagulation Status post gastric sleeve with revision to gastric bypass.  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  Reviewed dosing guidelines and will restart her on Xarelto at 10 mg daily as he is only had one thromboembolic event to my knowledge.

## 2018-01-12 NOTE — Assessment & Plan Note (Signed)
Status post gastric sleeve with revision to gastric bypass.  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  Reviewed dosing guidelines and will restart her on Xarelto at 10 mg daily as he is only had one thromboembolic event to my knowledge.

## 2018-01-23 ENCOUNTER — Other Ambulatory Visit: Payer: Self-pay | Admitting: Family Medicine

## 2018-02-01 ENCOUNTER — Ambulatory Visit: Payer: Self-pay | Admitting: Registered"

## 2018-02-04 ENCOUNTER — Emergency Department (HOSPITAL_COMMUNITY): Payer: Medicare Other

## 2018-02-04 ENCOUNTER — Encounter (HOSPITAL_COMMUNITY): Payer: Self-pay | Admitting: Nurse Practitioner

## 2018-02-04 DIAGNOSIS — R079 Chest pain, unspecified: Secondary | ICD-10-CM | POA: Diagnosis not present

## 2018-02-04 DIAGNOSIS — Z79899 Other long term (current) drug therapy: Secondary | ICD-10-CM | POA: Diagnosis not present

## 2018-02-04 DIAGNOSIS — E876 Hypokalemia: Secondary | ICD-10-CM | POA: Diagnosis not present

## 2018-02-04 DIAGNOSIS — Z86711 Personal history of pulmonary embolism: Secondary | ICD-10-CM | POA: Diagnosis not present

## 2018-02-04 DIAGNOSIS — Z86718 Personal history of other venous thrombosis and embolism: Secondary | ICD-10-CM | POA: Diagnosis not present

## 2018-02-04 DIAGNOSIS — R0789 Other chest pain: Secondary | ICD-10-CM | POA: Insufficient documentation

## 2018-02-04 DIAGNOSIS — R0602 Shortness of breath: Secondary | ICD-10-CM | POA: Diagnosis not present

## 2018-02-04 DIAGNOSIS — R51 Headache: Secondary | ICD-10-CM | POA: Diagnosis not present

## 2018-02-04 LAB — BASIC METABOLIC PANEL
Anion gap: 11 (ref 5–15)
BUN: 5 mg/dL — ABNORMAL LOW (ref 6–20)
CO2: 25 mmol/L (ref 22–32)
Calcium: 8.9 mg/dL (ref 8.9–10.3)
Chloride: 106 mmol/L (ref 98–111)
Creatinine, Ser: 0.81 mg/dL (ref 0.44–1.00)
GFR calc Af Amer: >60 mL/min (ref >60)
GFR calc non Af Amer: >60 mL/min (ref >60)
Glucose, Bld: 95 mg/dL (ref 70–99)
Potassium: 3 mmol/L — ABNORMAL LOW (ref 3.5–5.1)
Sodium: 142 mmol/L (ref 135–145)

## 2018-02-04 LAB — POCT I-STAT TROPONIN I: Troponin i, poc: 0 ng/mL (ref 0.00–0.08)

## 2018-02-04 LAB — CBC
HCT: 40.8 % (ref 36.0–46.0)
Hemoglobin: 12.6 g/dL (ref 12.0–15.0)
MCH: 25.9 pg — ABNORMAL LOW (ref 26.0–34.0)
MCHC: 30.9 g/dL (ref 30.0–36.0)
MCV: 83.8 fL (ref 78.0–100.0)
Platelets: 522 10*3/uL — ABNORMAL HIGH (ref 150–400)
RBC: 4.87 MIL/uL (ref 3.87–5.11)
RDW: 15.3 % (ref 11.5–15.5)
WBC: 10.7 10*3/uL — ABNORMAL HIGH (ref 4.0–10.5)

## 2018-02-04 LAB — I-STAT BETA HCG BLOOD, ED (NOT ORDERABLE): I-stat hCG, quantitative: <5 m[IU]/mL (ref <5)

## 2018-02-04 NOTE — ED Triage Notes (Signed)
Pt is c/o chest with associated nausea, vomiting and shortness of breath. Onset 2 days ago.

## 2018-02-05 ENCOUNTER — Other Ambulatory Visit: Payer: Self-pay

## 2018-02-05 ENCOUNTER — Encounter (HOSPITAL_COMMUNITY): Payer: Self-pay

## 2018-02-05 ENCOUNTER — Emergency Department (HOSPITAL_COMMUNITY)
Admission: EM | Admit: 2018-02-05 | Discharge: 2018-02-05 | Disposition: A | Discharge status: Home / Self Care | Payer: Medicare Other | Attending: Emergency Medicine | Admitting: Emergency Medicine

## 2018-02-05 ENCOUNTER — Emergency Department (HOSPITAL_COMMUNITY): Payer: Medicare Other

## 2018-02-05 DIAGNOSIS — E876 Hypokalemia: Secondary | ICD-10-CM

## 2018-02-05 DIAGNOSIS — R51 Headache: Secondary | ICD-10-CM

## 2018-02-05 DIAGNOSIS — R519 Headache, unspecified: Secondary | ICD-10-CM

## 2018-02-05 DIAGNOSIS — R0602 Shortness of breath: Secondary | ICD-10-CM | POA: Diagnosis not present

## 2018-02-05 DIAGNOSIS — R0789 Other chest pain: Secondary | ICD-10-CM | POA: Diagnosis not present

## 2018-02-05 DIAGNOSIS — R079 Chest pain, unspecified: Secondary | ICD-10-CM

## 2018-02-05 LAB — POCT I-STAT TROPONIN I: Troponin i, poc: 0 ng/mL (ref 0.00–0.08)

## 2018-02-05 LAB — D-DIMER, QUANTITATIVE: D-Dimer, Quant: 1.94 ug/mL-FEU — ABNORMAL HIGH (ref 0.00–0.50)

## 2018-02-05 MED ORDER — METOCLOPRAMIDE HCL 5 MG/ML IJ SOLN
10.0000 mg | Freq: Once | INTRAMUSCULAR | Status: AC
  Administered 2018-02-05: 10 mg via INTRAVENOUS
  Filled 2018-02-05: qty 2

## 2018-02-05 MED ORDER — SODIUM CHLORIDE 0.9 % IV BOLUS
1000.0000 mL | Freq: Once | INTRAVENOUS | Status: AC
  Administered 2018-02-05: 1000 mL via INTRAVENOUS

## 2018-02-05 MED ORDER — SODIUM CHLORIDE 0.9 % IJ SOLN
INTRAMUSCULAR | Status: AC
  Filled 2018-02-05: qty 50

## 2018-02-05 MED ORDER — IOPAMIDOL (ISOVUE-370) INJECTION 76%
100.0000 mL | Freq: Once | INTRAVENOUS | Status: AC | PRN
  Administered 2018-02-05: 100 mL via INTRAVENOUS

## 2018-02-05 MED ORDER — POTASSIUM CHLORIDE CRYS ER 20 MEQ PO TBCR
40.0000 meq | EXTENDED_RELEASE_TABLET | Freq: Once | ORAL | Status: AC
  Administered 2018-02-05: 40 meq via ORAL
  Filled 2018-02-05: qty 2

## 2018-02-05 MED ORDER — DIPHENHYDRAMINE HCL 50 MG/ML IJ SOLN
25.0000 mg | Freq: Once | INTRAMUSCULAR | Status: AC
  Administered 2018-02-05: 25 mg via INTRAVENOUS
  Filled 2018-02-05: qty 1

## 2018-02-05 MED ORDER — POTASSIUM CHLORIDE CRYS ER 20 MEQ PO TBCR: 20.0000 meq | EXTENDED_RELEASE_TABLET | Freq: Two times a day (BID) | ORAL | 0 refills | Status: DC

## 2018-02-05 MED ORDER — POTASSIUM CHLORIDE 10 MEQ/100ML IV SOLN
10.0000 meq | Freq: Once | INTRAVENOUS | Status: AC
  Administered 2018-02-05: 10 meq via INTRAVENOUS
  Filled 2018-02-05: qty 100

## 2018-02-05 MED ORDER — IOPAMIDOL (ISOVUE-370) INJECTION 76%
INTRAVENOUS | Status: AC
  Filled 2018-02-05: qty 100

## 2018-02-05 NOTE — ED Provider Notes (Signed)
[facility] DEPT Provider Note   CSN: [id number] Arrival date & time: 02/04/18  2142     History   Chief Complaint Chief Complaint  Patient presents with  . Chest Pain  . Emesis    HPI Crystal Garza is a 47 y.o. female.  The history is provided by the patient.  She has history of hypertension, GERD, DVT with pulmonary embolism, chronic anticoagulation with low-dose rivaroxaban comes in with 2-day history of chest pain, headache, nausea, vomiting.  Pain has been constant, but does seem to be worse if she overexerts herself.  Pain is sharp and left-sided.  There has been some dyspnea and diaphoresis as well as nausea.  She has not been able to hold anything on her stomach for the last 2 days.  Headache is occipital and worse with exposure to light and noise.  She rates her pains at 7/10.  She has not done anything to treat them.  She denies fever or chills.  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  Past Medical History:  Diagnosis Date  . Anemia    iron def  . Anginal pain ([place])   . Anxiety   . Arthritis    left knee   . Back pain   . Benign gastrointestinal stromal tumor (GIST)   . Chronic knee pain   . Depression   . DVT (deep venous thrombosis) ([place])   . GERD (gastroesophageal reflux disease)   . History of kidney stones   . Hx of laparoscopic gastric banding   . Hypertension   . Migraine   . Morbid obesity ([place])   . PE (pulmonary embolism)     Patient Active Problem List   Diagnosis Date Noted  . 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 History of pulmonary embolism 05/25/2017  . Angina, class III ([place]) 05/25/2017  . Symptoms of gastroesophageal reflux 11/09/2016  . Esophageal dysphagia   . Nausea without vomiting   . Family history of colon cancer   . Dizziness 11/08/2015  . 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 Chronic neck and back pain 02/27/2015  . Other chest  pain 12/10/2014  . Medication management 08/22/2014  . Hematuria 08/22/2014  . Pain in joint, ankle and foot 05/23/2014  . Chronic anticoagulation 05/17/2014  . Chronic thoracic back pain 04/11/2014  . Right flank pain 01/17/2014  . Degenerative arthritis of left knee 10/06/2013  . Status post total knee replacement 10/06/2013  . Edema 07/28/2013  . Chronic pain 06/13/2013  . 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 Bipolar disorder ([place]) 01/19/2012  . Nephrolithiasis 02/20/2011  . Eczematous dermatitis 10/22/2010  . Iron deficiency anemia 06/27/2010  . Anxiety 06/27/2010  . Migraine 09/12/2009  . Morbid obesity-BMI 62 10/30/2008  . Osteoarthrosis, unspecified whether generalized or localized, involving lower leg 07/05/2007  . DEPRESSION, MAJOR, RECURRENT 07/01/2006  . HYPERTENSION, BENIGN SYSTEMIC 07/01/2006  . RHINITIS, ALLERGIC 07/01/2006  . Gastroesophageal reflux disease 07/01/2006    Past Surgical History:  Procedure Laterality Date  . [place] STUDY N/A 05/13/2016   Procedure: [place] STUDY;  Surgeon: Manus Gunning, MD;  Location: [facility];  Service: Gastroenterology;  Laterality: N/A;  . ABDOMINAL HYSTERECTOMY    . BIOPSY  02/16/2012   Procedure: BIOPSY;  Surgeon: [name], MD;  Location: [facility];  Service:  General;;  biopsy of mass x 2  . BREATH TEK H PYLORI  07/28/2011   Procedure: [place];  Surgeon: [name], MD;  Location: [facility];  Service: General;  Laterality: N/A;  to be done at 745  . CESAREAN SECTION  05/1991  . COLONOSCOPY WITH PROPOFOL N/A 03/03/2016   Procedure: COLONOSCOPY WITH PROPOFOL;  Surgeon: Manus Gunning, MD;  Location: [facility];  Service: Gastroenterology;  Laterality: N/A;  . ESOPHAGEAL MANOMETRY N/A 05/13/2016   Procedure: ESOPHAGEAL MANOMETRY (EM);  Surgeon: Manus Gunning, MD;  Location: [facility];   Service: Gastroenterology;  Laterality: N/A;  . ESOPHAGOGASTRODUODENOSCOPY N/A 08/29/2012   Procedure: ESOPHAGOGASTRODUODENOSCOPY (EGD);  Surgeon: [name], MD;  Location: [facility];  Service: General;  Laterality: N/A;  . ESOPHAGOGASTRODUODENOSCOPY N/A 09/04/2014   Procedure: ESOPHAGOGASTRODUODENOSCOPY (EGD);  Surgeon: Alphonsa Overall, MD;  Location: [facility];  Service: General;  Laterality: N/A;  . ESOPHAGOGASTRODUODENOSCOPY (EGD) WITH PROPOFOL N/A 03/03/2016   Procedure: ESOPHAGOGASTRODUODENOSCOPY (EGD) WITH PROPOFOL;  Surgeon: Manus Gunning, MD;  [facility]: [facility];  Service: Gastroenterology;  Laterality: N/A;  . EUS  03/03/2012   Procedure: UPPER ENDOSCOPIC ULTRASOUND (EUS) LINEAR;  Surgeon: Milus Banister, MD;  Location: [facility];  Service: Endoscopy;  Laterality: N/A;  . GASTRIC ROUX-EN-Y N/A 12/07/2017   Procedure: LAPAROSCOPIC ROUX-EN-Y GASTRIC BYPASS WITH LYSIS OF ADHESIONS AND UPPER ENDOSCOPY ERAS Pathway;  Surgeon: Johnathan Hausen, MD;  Location: [facility];  Service: General;  Laterality: N/A;  . gist    . KNEE ARTHROSCOPY    . LAPAROSCOPIC GASTRECTOMY  04/08/2012   Procedure: LAPAROSCOPIC GASTRECTOMY;  Surgeon: [name], MD;  Location: [facility];  Service: General;;  removal of GIST tumor of stomach  . LAPAROSCOPIC GASTRIC SLEEVE RESECTION N/A 08/29/2012   Procedure: LAPAROSCOPIC GASTRIC SLEEVE RESECTION;  Surgeon: [name], MD;  Location: [facility];  Service: General;  Laterality: N/A;  Sleeve Gastrectomy  . LEFT HEART CATH AND CORONARY ANGIOGRAPHY N/A 05/28/2017   Procedure: LEFT HEART CATH AND CORONARY ANGIOGRAPHY;  Surgeon: Leonie Man, MD;  Location: [facility];  Service: Cardiovascular;  Laterality: N/A;  . ROUX-EN-Y GASTRIC BYPASS    . TOTAL KNEE ARTHROPLASTY Left 10/06/2013   Procedure: LEFT TOTAL KNEE ARTHROPLASTY;  Surgeon: Mcarthur Rossetti, MD;  Location: [facility];  Service: Orthopedics;  Laterality: Left;  . TUBAL LIGATION        OB History   None      Home Medications    Prior to Admission medications   Medication Sig Start Date End Date Taking? Authorizing Provider  ALPRAZolam Duanne Moron) 1 MG tablet TAKE (1) TABLET TWICE A DAY AS NEEDED. 01/26/18   Dickie La, MD  AMBULATORY NON FORMULARY MEDICATION Medication Name: FDgard: use as directed for dyspepsia 07/01/17   Armbruster, Carlota Raspberry, MD  desipramine (NORPRAMIN) 10 MG tablet Take 2 tablets (20 mg total) by mouth at bedtime. 07/01/17   Armbruster, Carlota Raspberry, MD  diltiazem (CARDIZEM CD) 120 MG 24 hr capsule Take 1 capsule (120 mg total) by mouth daily. 11/29/17   Revankar, Reita Cliche, MD  enoxaparin (LOVENOX) 60 MG/0.6ML injection Inject 0.6 mLs (60 mg total) into the skin 2 (two) times daily for 28 days. 12/10/17 01/07/18  Johnathan Hausen, MD  lamoTRIgine (LAMICTAL) 200 MG tablet Take 1 tablet (200 mg total) by mouth at bedtime. 08/05/16   Dickie La, MD  lisinopril (PRINIVIL,ZESTRIL) 10 MG tablet Take 1 tablet (10 mg total) by  mouth daily. 06/23/17   Dickie La, MD  ondansetron (ZOFRAN-ODT) 4 MG disintegrating tablet Take 4 mg by mouth every 8 (eight) hours as needed for nausea or vomiting.  11/03/17   [provider]  ondansetron (ZOFRAN-ODT) 4 MG disintegrating tablet Take 1 tablet (4 mg total) by mouth every 6 (six) hours as needed for nausea or vomiting. 12/10/17   Johnathan Hausen, MD  oxyCODONE-acetaminophen [facility]) 10-325 MG tablet Take one by mouth every 6 hours as needed for pain do not fill before September 16 01/11/18   Dickie La, MD  pantoprazole (PROTONIX) 40 MG tablet Take 1 tablet (40 mg total) by mouth daily. 12/10/17   Johnathan Hausen, MD  risperiDONE (RISPERDAL) 1 MG tablet Take 1 tablet (1 mg total) by mouth at bedtime. 08/05/16   Dickie La, MD  rivaroxaban (XARELTO) 10 MG TABS tablet Take 1 tablet (10 mg total) by mouth daily. 01/11/18   Dickie La, MD  tiZANidine (ZANAFLEX) 4 MG tablet Take 1 tablet (4 mg total) by mouth 2 (two) times daily  as needed for muscle spasms. 10/15/17   Dickie La, MD    Family History Family History  Problem Relation Age of Onset  . Diabetes Mother   . Diabetes Father   . Heart disease Father   . Colon cancer Father        brain tumor  . Colon polyps Father   . Colitis Father   . Irritable bowel syndrome Father   . Diabetes Maternal Grandmother   . Diabetes Maternal Grandfather   . Schizophrenia Maternal Grandfather   . Heart disease Paternal Uncle   . Diabetes Paternal Uncle   . Heart disease Paternal Grandmother   . Heart disease Paternal Grandfather   . Diabetes Maternal Aunt   . Diabetes Maternal Uncle   . Diabetes Paternal Aunt   . Cancer Other   . Hypertension Other     Social History Social History   Tobacco Use  . Smoking status: Never Smoker  . Smokeless tobacco: Never Used  Substance Use Topics  . Alcohol use: No    Alcohol/week: 0.0 standard drinks    Frequency: Never  . Drug use: No     Allergies   Codeine and Tuna [fish allergy]   Review of Systems Review of Systems  All other systems reviewed and are negative.    Physical Exam Updated Vital Signs BP 133/88 (BP Location: Left Arm)   Pulse 97   Temp 98.8 F (37.1 C) (Oral)   Resp 20   Ht 5\' 7"  (1.702 m)   Wt (!) 145.2 kg   SpO2 97%   BMI 50.12 kg/m   Physical Exam  Nursing note and vitals reviewed.  Morbidly obese 47 year old female, resting comfortably and in no acute distress. Vital signs are normal. Oxygen saturation is 97%, which is normal. Head is normocephalic and atraumatic. PERRLA, EOMI. Oropharynx is clear. Neck is nontender and supple without adenopathy or JVD. Back is nontender and there is no CVA tenderness. Lungs are clear without rales, wheezes, or rhonchi. Chest has moderate tenderness of the left anterior chest wall which does reproduce her pain.  There is no crepitus. Heart has regular rate and rhythm without murmur. Abdomen is soft, flat, nontender without masses or  hepatosplenomegaly and peristalsis is normoactive. Extremities have no cyanosis or edema, full range of motion is present. Skin is warm and dry without rash. Neurologic: Mental status is normal, cranial nerves are  intact, there are no motor or sensory deficits.  ED Treatments / Results  Labs (all labs ordered are listed, but only abnormal results are displayed) Labs Reviewed  BASIC METABOLIC PANEL - Abnormal; Notable for the following components:      Result Value   Potassium 3.0 (*)    BUN 5 (*)    All other components within normal limits  CBC - Abnormal; Notable for the following components:   WBC 10.7 (*)    MCH 25.9 (*)    Platelets 522 (*)    All other components within normal limits  D-DIMER, QUANTITATIVE (NOT AT [facility]) - Abnormal; Notable for the following components:   D-Dimer, Quant 1.94 (*)    All other components within normal limits  I-STAT TROPONIN, ED  I-STAT BETA HCG BLOOD, ED ([facility], [facility], [facility] ONLY)  POCT I-STAT TROPONIN I  I-STAT BETA HCG BLOOD, ED (NOT ORDERABLE)  I-STAT TROPONIN, ED  POCT I-STAT TROPONIN I    EKG EKG Interpretation  Date/Time:  Friday February 04 2018 21:51:26 EDT Ventricular Rate:  91 PR Interval:    QRS Duration: 85 QT Interval:  338 QTC Calculation: 416 R Axis:   51 Text Interpretation:  Sinus rhythm Low voltage, precordial leads When compared with ECG of 11/16/2016, Premature atrial complexes are no longer present Confirmed by Delora Fuel ([id number]) on 02/04/2018 11:06:53 PM   Radiology Dg Chest 2 View  Result Date: 02/04/2018 CLINICAL DATA:  Chest pain EXAM: CHEST - 2 VIEW COMPARISON:  09/15/2017 FINDINGS: The heart size and mediastinal contours are within normal limits. Both lungs are clear. The visualized skeletal structures are unremarkable. IMPRESSION: No active cardiopulmonary disease. Electronically Signed   By: Jerilynn Mages.  Shick M.D.   On: 02/04/2018 22:41    Procedures Procedures   Medications Ordered in ED Medications  sodium  chloride 0.9 % injection (has no administration in time range)  potassium chloride SA (K-DUR,KLOR-CON) CR tablet 40 mEq (has no administration in time range)  sodium chloride 0.9 % bolus 1,000 mL (0 mLs Intravenous Stopped 02/05/18 0317)  metoCLOPramide (REGLAN) injection 10 mg (10 mg Intravenous Given 02/05/18 0217)  diphenhydrAMINE (BENADRYL) injection 25 mg (25 mg Intravenous Given 02/05/18 0217)  potassium chloride 10 mEq in 100 mL IVPB (0 mEq Intravenous Stopped 02/05/18 0317)  iopamidol (ISOVUE-370) 76 % injection 100 mL (100 mLs Intravenous Contrast Given 02/05/18 0518)     Initial Impression / Assessment and Plan / ED Course  I have reviewed the triage vital signs and the nursing notes.  Pertinent labs & imaging results that were available during my care of the patient were reviewed by me and considered in my medical decision making (see chart for details).  Chest pain of uncertain cause.  Old records are reviewed, and she had cardiac catheterization May 25, 2017 at which time coronary arteries were essentially normal, so she is at very low risk of having angina from obstructive coronary artery disease.  Her prior episode of DVT occurred following joint replacement surgery.  As she does have risk factor for PE with recent surgery, and her anticoagulation is with low-dose rivaroxaban, so will screen with d-dimer.  Headache appears to be either migraine variant or muscle contraction headache and will be treated with IV fluids and metoclopramide.  Initial ECG is normal and troponin is normal, will check delta troponin.  Delta troponin is undetectable.  D-dimer has come back elevated, so she is sent for CT angiogram which shows no evidence of pulmonary embolism or other  acute pathology.  Potassium was noted to have been low and she is given intravenous potassium.  Headache is much improved following headache cocktail.  No evidence of any serious pathology at this point, patient is felt to be safe  for discharge.  She is discharged with prescription for K-Dur.  She has analgesics at home which she is to use.  Follow-up with PCP.  Return precautions discussed.  Final Clinical Impressions(s) / ED Diagnoses   Final diagnoses:  Nonspecific chest pain  Bad headache  Hypokalemia    ED Discharge Orders         Ordered    potassium chloride SA (K-DUR,KLOR-CON) 20 MEQ tablet  2 times daily     02/05/18 5271           Delora Fuel, MD 29/29/09 [phone]

## 2018-02-14 ENCOUNTER — Other Ambulatory Visit: Payer: Self-pay | Admitting: Family Medicine

## 2018-02-21 ENCOUNTER — Other Ambulatory Visit: Payer: Self-pay | Admitting: Family Medicine

## 2018-02-21 ENCOUNTER — Other Ambulatory Visit: Payer: Self-pay | Admitting: Gastroenterology

## 2018-03-14 ENCOUNTER — Encounter: Payer: Self-pay | Admitting: Family Medicine

## 2018-03-15 ENCOUNTER — Other Ambulatory Visit: Payer: Self-pay | Admitting: Family Medicine

## 2018-03-18 ENCOUNTER — Other Ambulatory Visit: Payer: Self-pay | Admitting: Family Medicine

## 2018-03-18 DIAGNOSIS — M546 Pain in thoracic spine: Principal | ICD-10-CM

## 2018-03-18 DIAGNOSIS — G8929 Other chronic pain: Secondary | ICD-10-CM

## 2018-03-21 DIAGNOSIS — R350 Frequency of micturition: Secondary | ICD-10-CM | POA: Diagnosis not present

## 2018-03-21 DIAGNOSIS — R3121 Asymptomatic microscopic hematuria: Secondary | ICD-10-CM | POA: Diagnosis not present

## 2018-04-07 ENCOUNTER — Ambulatory Visit: Payer: Self-pay

## 2018-04-12 ENCOUNTER — Ambulatory Visit (INDEPENDENT_AMBULATORY_CARE_PROVIDER_SITE_OTHER): Payer: Medicare Other | Admitting: Physical Medicine and Rehabilitation

## 2018-04-12 ENCOUNTER — Encounter (INDEPENDENT_AMBULATORY_CARE_PROVIDER_SITE_OTHER): Payer: Self-pay | Admitting: Physical Medicine and Rehabilitation

## 2018-04-12 ENCOUNTER — Telehealth (INDEPENDENT_AMBULATORY_CARE_PROVIDER_SITE_OTHER): Payer: Self-pay | Admitting: Physical Medicine and Rehabilitation

## 2018-04-12 ENCOUNTER — Ambulatory Visit (INDEPENDENT_AMBULATORY_CARE_PROVIDER_SITE_OTHER): Payer: Medicare Other

## 2018-04-12 VITALS — BP 138/75 | HR 81

## 2018-04-12 DIAGNOSIS — M545 Low back pain, unspecified: Secondary | ICD-10-CM

## 2018-04-12 DIAGNOSIS — M47816 Spondylosis without myelopathy or radiculopathy, lumbar region: Secondary | ICD-10-CM | POA: Diagnosis not present

## 2018-04-12 DIAGNOSIS — G8929 Other chronic pain: Secondary | ICD-10-CM | POA: Diagnosis not present

## 2018-04-12 DIAGNOSIS — M549 Dorsalgia, unspecified: Secondary | ICD-10-CM | POA: Diagnosis not present

## 2018-04-12 DIAGNOSIS — M7918 Myalgia, other site: Secondary | ICD-10-CM | POA: Diagnosis not present

## 2018-04-12 NOTE — Progress Notes (Signed)
Numeric Pain Rating Scale and Functional Assessment Average Pain 7 Pain Right Now 7 My pain is constant, sharp, dull, stabbing and aching Pain is worse with: walking, sitting and some activites Pain improves with: heat/ice and medication   In the last MONTH (on 0-10 scale) has pain interfered with the following?  1. General activity like being  able to carry out your everyday physical activities such as walking, climbing stairs, carrying groceries, or moving a chair?  Rating(5)  2. Relation with others like being able to carry out your usual social activities and roles such as  activities at home, at work and in your community. Rating(5)  3. Enjoyment of life such that you have  been bothered by emotional problems such as feeling anxious, depressed or irritable?  Rating(10)

## 2018-04-12 NOTE — Telephone Encounter (Signed)
Notification or Prior Authorization is not required for the requested services  This [employer] members plan does not currently require a prior authorization for [phone]  Decision ID #:[id number]

## 2018-04-13 ENCOUNTER — Encounter: Payer: Self-pay | Admitting: Family Medicine

## 2018-04-14 ENCOUNTER — Other Ambulatory Visit: Payer: Self-pay | Admitting: Family Medicine

## 2018-04-15 ENCOUNTER — Other Ambulatory Visit: Payer: Self-pay | Admitting: Family Medicine

## 2018-04-15 MED ORDER — OXYCODONE-ACETAMINOPHEN 10-325 MG PO TABS: ORAL_TABLET | ORAL | 0 refills | Status: DC

## 2018-04-20 NOTE — Telephone Encounter (Signed)
[place]. Medication was filled and picked up on the 13th.  Ottis Stain, CMA

## 2018-04-20 NOTE — Telephone Encounter (Signed)
Dear [name] Team I sent a refill on the 13th. Let me know if there is an issue THANKS! [name]

## 2018-04-22 ENCOUNTER — Encounter (INDEPENDENT_AMBULATORY_CARE_PROVIDER_SITE_OTHER): Payer: Medicare Other | Admitting: Physical Medicine and Rehabilitation

## 2018-05-05 ENCOUNTER — Ambulatory Visit (INDEPENDENT_AMBULATORY_CARE_PROVIDER_SITE_OTHER): Payer: Medicare Other | Admitting: Family Medicine

## 2018-05-05 ENCOUNTER — Other Ambulatory Visit: Payer: Self-pay

## 2018-05-05 ENCOUNTER — Encounter: Payer: Self-pay | Admitting: Family Medicine

## 2018-05-05 VITALS — BP 132/76 | HR 76 | Temp 98.2°F | Ht 67.0 in | Wt 296.6 lb

## 2018-05-05 DIAGNOSIS — G8929 Other chronic pain: Secondary | ICD-10-CM | POA: Diagnosis not present

## 2018-05-05 DIAGNOSIS — Z23 Encounter for immunization: Secondary | ICD-10-CM | POA: Diagnosis not present

## 2018-05-05 DIAGNOSIS — Z9884 Bariatric surgery status: Secondary | ICD-10-CM

## 2018-05-05 DIAGNOSIS — M542 Cervicalgia: Secondary | ICD-10-CM

## 2018-05-05 DIAGNOSIS — M549 Dorsalgia, unspecified: Secondary | ICD-10-CM

## 2018-05-05 MED ORDER — OXYCODONE-ACETAMINOPHEN 10-325 MG PO TABS: ORAL_TABLET | ORAL | 0 refills | Status: DC

## 2018-05-06 NOTE — Assessment & Plan Note (Signed)
Good weight loss.

## 2018-05-06 NOTE — Progress Notes (Signed)
    CHIEF COMPLAINT / HPI: #1.  Follow-up recent gastric bypass.  Doing pretty well.  Overall has lost 65 pounds.  Still having some nausea and emesis particularly if she eats meat but getting better.  She is realizing what she can and cannot eat. 2.  Upper back pain.  Muscle spasm.  REVIEW OF SYSTEMS: Denies shortness of breath.  No chest pain.  PERTINENT  PMH / PSH: I have reviewed the patient's medications, allergies, past medical and surgical history, smoking status and updated in the EMR as appropriate.   OBJECTIVE:  Vital signs reviewed. GENERAL: Well-developed, well-nourished, no acute distress. CARDIOVASCULAR: Regular rate and rhythm no murmur gallop or rub LUNGS: Clear to auscultation bilaterally, no rales or wheeze. ABDOMEN: Soft positive bowel sounds NEURO: No gross focal neurological deficits. MSK: Movement of extremity x 4.  Tender to palpation over the left rhomboid area.  There is some significant spasm here.  PROCEDURE: INJECTION: Patient was given informed consent, signed copy in the chart. Appropriate time out was taken. Area prepped and draped in usual sterile fashion. Ethyl chloride was  used for local anesthesia. A 21 gauge 1 1/2 inch needle was used.. 1 cc of methylprednisolone 40 mg/ml plus 1 cc of 1% lidocaine without epinephrine was injected into the trigger point areas x2 of the left rhomboid muscle using a(n) appendicular approach.   The patient tolerated the procedure well. There were no complications. Post procedure instructions were given.     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  Chronic neck and back pain Trigger point injection today.  Exercises given in handout form.  Follow-up 1 month.  She has lost for bra sizes so she may have some improvement in the future from her chronic upper back pain secondary to macromastia.

## 2018-05-06 NOTE — Assessment & Plan Note (Signed)
Trigger point injection today.  Exercises given in handout form.  Follow-up 1 month.  She has lost for bra sizes so she may have some improvement in the future from her chronic upper back pain secondary to macromastia.

## 2018-05-10 ENCOUNTER — Ambulatory Visit: Payer: Medicare Other

## 2018-05-16 ENCOUNTER — Other Ambulatory Visit: Payer: Self-pay | Admitting: Cardiology

## 2018-05-20 ENCOUNTER — Other Ambulatory Visit: Payer: Self-pay | Admitting: Family Medicine

## 2018-05-23 ENCOUNTER — Ambulatory Visit: Payer: Medicare Other

## 2018-05-23 ENCOUNTER — Encounter (INDEPENDENT_AMBULATORY_CARE_PROVIDER_SITE_OTHER): Payer: Self-pay | Admitting: Physical Medicine and Rehabilitation

## 2018-05-23 NOTE — Progress Notes (Signed)
Crystal Garza - 48 y.o. female MRN [id number]  Date of birth: [birth year]  Office Visit Note: Visit Date: 04/12/2018 PCP: Dickie La, MD Referred by: Dickie La, MD  Subjective: Chief Complaint  Patient presents with  . Lower Back - Pain  . Neck - Pain  . Middle Back - Pain   HPI: Crystal Garza is a 48 y.o. female who comes in today For evaluation and management of 2 distinct issues.  She reports chronic low back pain worse with standing and ambulating without real radicular complaints with some referral into the bilateral hips.  This is been a chronic situation for her which we have seen her in the past.  We have completed facet joint blocks with decent relief when we have done them.  Unfortunately she has been a poor candidate for radiofrequency ablation because of her size.  She is morbidly obese.  She continues to try to exercise and do the right things.  She continues to see Dr. Mallie Mussel who is her primary care provider and works in family medicine sports medicine.  For pain management she receives 10 mg oxycodone up to 4 times per day along with Xanax.  Obviously the combination fairly high risk but being managed well.  Despite this management still continued pain, rates her pain as a 7 out of 10.  She reports worsening again with walking and standing but also laying on her left side.  She has not noted any focal weakness.  She has had a fall over the last year but no real worsening back pain from that.  Her second problem is is mid back pain between the shoulder blades referral up to the neck and not down from the neck.  Has had physical therapy and dry needling over a year ago with some relief.  Feels like her pain is getting worse now feels like it somewhat in the chest and headaches now.  History of migraine.  Her biggest complaint is sharp stabbing midline upper back pain between the shoulder blades.  Review of Systems  Constitutional: Negative for chills, fever,  malaise/fatigue and weight loss.  HENT: Negative for hearing loss and sinus pain.   Eyes: Negative for blurred vision, double vision and photophobia.  Respiratory: Negative for cough and shortness of breath.   Cardiovascular: Negative for chest pain, palpitations and leg swelling.  Gastrointestinal: Negative for abdominal pain, nausea and vomiting.  Genitourinary: Negative for flank pain.  Musculoskeletal: Positive for back pain and joint pain. Negative for myalgias.  Skin: Negative for itching and rash.  Neurological: Positive for headaches. Negative for tremors, focal weakness and weakness.  Endo/Heme/Allergies: Negative.   Psychiatric/Behavioral: Negative for depression.  All other systems reviewed and are negative.  Otherwise per HPI.  Assessment & Plan: Visit Diagnoses:  1. Mid back pain   2. Chronic bilateral low back pain without sciatica   3. Spondylosis without myelopathy or radiculopathy, lumbar region   4. Myofascial pain syndrome     Plan: Findings:  Chronic history of mainly low back pain due to facet arthropathy but also history of mid back pain between the shoulder blades.  Imaging of the thoracic spine was reviewed and she has had some imaging over the last year or 2 for chest pain and other thoracic issues.  Did thoracic x-ray today which shows some mild spondylosis.  No thoracic MRI performed.  We discussed pain in the region she describes is mostly myofascial pain in general.  On  exam she does have focal trigger points and taut bands.  I think physical therapy again with dry needling is probably the right answer.  Unfortunately with the worsening symptoms of overall pain despite the fact that she is taking 10 mg oxycodone up to 4 times a day is a significant issue.  She may be getting migraine headache rebound headaches from chronic opioid use.  She might do well with a comprehensive pain management approach but these are hard to get into these days and hard to find.  We  talked about changing positions and spine anatomy with what she does during the day.  We talked about massage therapy.  Best approach would be physical therapy with dry needling.  Secondarily low back pain with facet arthropathy.  Unfortunately not radiofrequency ablation candidate.  Offered to repeat the facet joint blocks as they have at least helped her for quite a period of time.  We have been able to do intra-articular injections with cortisone and actually gave her some relief over the years.  Would recommend repeating this.  She did not ask that we are not following chronic opioid medication in our office as we just do not have the facilities to do that.    Meds & Orders: No orders of the defined types were placed in this encounter.   Orders Placed This Encounter  Procedures  . XR Thoracic Spine 2 View    Follow-up: Return for Facet joint block.   Procedures: No procedures performed  No notes on file   Clinical History: MRI LUMBAR SPINE WITHOUT CONTRAST  TECHNIQUE: Multiplanar, multisequence MR imaging of the lumbar spine was performed. No intravenous contrast was administered.  COMPARISON:  CT 01/16/2014  FINDINGS: There is no abnormality at L3-4 or above. The discs are normal. The canal and foramina are widely patent. The distal cord and conus are normal with conus tip at L1.  L4-5: There is bilateral facet degeneration with facet and ligamentous hypertrophy. There is anterolisthesis of 2 mm. The disc bulges diffusely. No apparent compressive stenosis. There is mild discogenic endplate edema which could be associated with back pain.  L5-S1: No disc pathology. Mild facet degeneration and hypertrophy. No stenosis.  IMPRESSION: Findings at L4-5 that could relate to back pain. Bilateral facet degeneration with 2 mm of anterolisthesis. Degeneration and bulging of the disc. Discogenic endplate edema. No apparent stenosis or neural compression.   Electronically  Signed   By: Nelson Chimes M.D.   On: 05/06/2014 09:32   She reports that she has never smoked. She has never used smokeless tobacco. No results for input(s): HGBA1C, LABURIC in the last 8760 hours.  Objective:  VS:  HT:    WT:   BMI:     BP:138/75  HR:81bpm  TEMP: ( )  RESP:  Physical Exam Vitals signs and nursing note reviewed.  Constitutional:      General: She is not in acute distress.    Appearance: Normal appearance. She is well-developed. She is obese.  HENT:     Head: Normocephalic and atraumatic.     Nose: Nose normal.     Mouth/Throat:     Mouth: Mucous membranes are moist.     Pharynx: Oropharynx is clear.  Eyes:     Conjunctiva/sclera: Conjunctivae normal.     Pupils: Pupils are equal, round, and reactive to light.  Neck:     Musculoskeletal: Normal range of motion and neck supple.  Cardiovascular:     Rate and Rhythm: Regular  rhythm.  Pulmonary:     Effort: Pulmonary effort is normal. No respiratory distress.  Abdominal:     General: There is no distension.     Palpations: Abdomen is soft.     Tenderness: There is no guarding.  Musculoskeletal:     Right lower leg: No edema.     Left lower leg: No edema.     Comments: Patient has no shoulder impingement signs bilaterally.  She does have taut bands and areas I would consider trigger points in the rhomboids bilaterally.  Little bit difficult exam to to body habitus.  She has no pain rocking over the vertebral bodies of the thoracic or lumbar spine.  She does have pain going from sit to stand in full extension with facet loading.  No pain over the greater trochanters and no pain with hip rotation.  She has good distal strength.  Skin:    General: Skin is warm and dry.     Findings: No erythema or rash.  Neurological:     General: No focal deficit present.     Mental Status: She is alert and oriented to person, place, and time.     Motor: No abnormal muscle tone.     Coordination: Coordination normal.      Gait: Gait normal.  Psychiatric:        Mood and Affect: Mood normal.        Behavior: Behavior normal.        Thought Content: Thought content normal.     Ortho Exam Imaging: AP and lateral thoracic spine: AP and lateral thoracic spine shows normal anatomic alignment without compression fracture or dislocation or scoliosis.  Degenerative facet arthropathy mild in the midthoracic region.  No other lesions or concerning factors noted.  Past Medical/Family/Surgical/Social History: Medications & Allergies reviewed per EMR, new medications updated. Patient Active Problem List   Diagnosis Date Noted  . 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 History of pulmonary embolism 05/25/2017  . Angina, class III ([place]) 05/25/2017  . Symptoms of gastroesophageal reflux 11/09/2016  . Esophageal dysphagia   . Nausea without vomiting   . Family history of colon cancer   . Dizziness 11/08/2015  . 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 Chronic neck and back pain 02/27/2015  . Other chest pain 12/10/2014  . Medication management 08/22/2014  . Hematuria 08/22/2014  . Pain in joint, ankle and foot 05/23/2014  . Chronic anticoagulation 05/17/2014  . Chronic thoracic back pain 04/11/2014  . Right flank pain 01/17/2014  . Degenerative arthritis of left knee 10/06/2013  . Status post total knee replacement 10/06/2013  . Edema 07/28/2013  . Chronic pain 06/13/2013  . 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 Bipolar disorder ([place]) 01/19/2012  . Nephrolithiasis 02/20/2011  . Eczematous dermatitis 10/22/2010  . Iron deficiency anemia 06/27/2010  . Anxiety 06/27/2010  . Migraine 09/12/2009  . Morbid obesity-BMI 62 10/30/2008  . Osteoarthrosis, unspecified whether generalized or localized, involving lower leg 07/05/2007  . DEPRESSION, MAJOR, RECURRENT 07/01/2006  . HYPERTENSION, BENIGN SYSTEMIC 07/01/2006  . RHINITIS,  ALLERGIC 07/01/2006  . Gastroesophageal reflux disease 07/01/2006   Past Medical History:  Diagnosis Date  . Anemia    iron def  . Anginal pain ([place])   . Anxiety   . Arthritis    left knee   . Back pain   . Benign gastrointestinal stromal tumor (GIST)   .  Chronic knee pain   . Depression   . DVT (deep venous thrombosis) ([place])   . GERD (gastroesophageal reflux disease)   . History of kidney stones   . Hx of laparoscopic gastric banding   . Hypertension   . Migraine   . Morbid obesity ([place])   . PE (pulmonary embolism)    Family History  Problem Relation Age of Onset  . Diabetes Mother   . Diabetes Father   . Heart disease Father   . Colon cancer Father        brain tumor  . Colon polyps Father   . Colitis Father   . Irritable bowel syndrome Father   . Diabetes Maternal Grandmother   . Diabetes Maternal Grandfather   . Schizophrenia Maternal Grandfather   . Heart disease Paternal Uncle   . Diabetes Paternal Uncle   . Heart disease Paternal Grandmother   . Heart disease Paternal Grandfather   . Diabetes Maternal Aunt   . Diabetes Maternal Uncle   . Diabetes Paternal Aunt   . Cancer Other   . Hypertension Other    Past Surgical History:  Procedure Laterality Date  . [place] STUDY N/A 05/13/2016   Procedure: [place] STUDY;  Surgeon: Manus Gunning, MD;  Location: [facility];  Service: Gastroenterology;  Laterality: N/A;  . ABDOMINAL HYSTERECTOMY    . BIOPSY  02/16/2012   Procedure: BIOPSY;  Surgeon: [name], MD;  Location: [facility];  Service: General;;  biopsy of mass x 2  . BREATH TEK H PYLORI  07/28/2011   Procedure: [place];  Surgeon: [name], MD;  Location: [facility];  Service: General;  Laterality: N/A;  to be done at 745  . CESAREAN SECTION  05/1991  . COLONOSCOPY WITH PROPOFOL N/A 03/03/2016   Procedure: COLONOSCOPY WITH PROPOFOL;  Surgeon: Manus Gunning, MD;  Location: [facility];  Service:  Gastroenterology;  Laterality: N/A;  . ESOPHAGEAL MANOMETRY N/A 05/13/2016   Procedure: ESOPHAGEAL MANOMETRY (EM);  Surgeon: Manus Gunning, MD;  Location: [facility];  Service: Gastroenterology;  Laterality: N/A;  . ESOPHAGOGASTRODUODENOSCOPY N/A 08/29/2012   Procedure: ESOPHAGOGASTRODUODENOSCOPY (EGD);  Surgeon: [name], MD;  Location: [facility];  Service: General;  Laterality: N/A;  . ESOPHAGOGASTRODUODENOSCOPY N/A 09/04/2014   Procedure: ESOPHAGOGASTRODUODENOSCOPY (EGD);  Surgeon: Alphonsa Overall, MD;  Location: [facility];  Service: General;  Laterality: N/A;  . ESOPHAGOGASTRODUODENOSCOPY (EGD) WITH PROPOFOL N/A 03/03/2016   Procedure: ESOPHAGOGASTRODUODENOSCOPY (EGD) WITH PROPOFOL;  Surgeon: Manus Gunning, MD;  [facility];  Service: Gastroenterology;  Laterality: N/A;  . EUS  03/03/2012   Procedure: UPPER ENDOSCOPIC ULTRASOUND (EUS) LINEAR;  Surgeon: Milus Banister, MD;  Location: [facility];  Service: Endoscopy;  Laterality: N/A;  . GASTRIC ROUX-EN-Y N/A 12/07/2017   Procedure: LAPAROSCOPIC ROUX-EN-Y GASTRIC BYPASS WITH LYSIS OF ADHESIONS AND UPPER ENDOSCOPY ERAS Pathway;  Surgeon: Johnathan Hausen, MD;  Location: [facility];  Service: General;  Laterality: N/A;  . gist    . KNEE ARTHROSCOPY    . LAPAROSCOPIC GASTRECTOMY  04/08/2012   Procedure: LAPAROSCOPIC GASTRECTOMY;  Surgeon: [name], MD;  Location: [facility];  Service: General;;  removal of GIST tumor of stomach  . LAPAROSCOPIC GASTRIC SLEEVE RESECTION N/A 08/29/2012   Procedure: LAPAROSCOPIC GASTRIC SLEEVE RESECTION;  Surgeon: [name], MD;  Location: [facility];  Service: General;  Laterality: N/A;  Sleeve Gastrectomy  . LEFT HEART CATH AND CORONARY ANGIOGRAPHY N/A 05/28/2017  Procedure: LEFT HEART CATH AND CORONARY ANGIOGRAPHY;  Surgeon: Leonie Man, MD;  Location: [facility];  Service: Cardiovascular;  Laterality: N/A;  . ROUX-EN-Y GASTRIC BYPASS    . TOTAL KNEE ARTHROPLASTY Left  10/06/2013   Procedure: LEFT TOTAL KNEE ARTHROPLASTY;  Surgeon: Mcarthur Rossetti, MD;  Location: [facility];  Service: Orthopedics;  Laterality: Left;  . TUBAL LIGATION     Social History   Occupational History  . Not on file  Tobacco Use  . Smoking status: Never Smoker  . Smokeless tobacco: Never Used  Substance and Sexual Activity  . Alcohol use: No    Alcohol/week: 0.0 standard drinks    Frequency: Never  . Drug use: No  . Sexual activity: Yes

## 2018-05-24 ENCOUNTER — Other Ambulatory Visit: Payer: Self-pay

## 2018-05-24 ENCOUNTER — Telehealth: Payer: Self-pay

## 2018-05-24 DIAGNOSIS — K824 Cholesterolosis of gallbladder: Secondary | ICD-10-CM

## 2018-05-24 NOTE — Progress Notes (Unsigned)
Order placed fo RUQ U/S to monitor gallbladder polyp.

## 2018-05-24 NOTE — Telephone Encounter (Signed)
Pt is due for RUQ U/S. Called and spoke to patient. She can do any day next week except Wednesday, prefers am.  Scheduled her for 10:00am on Tues 05-31-18 npo after midnight.  Patient informed.

## 2018-05-24 NOTE — Telephone Encounter (Signed)
-----   Message from Roetta Sessions, [place] sent at 05/25/2017 10:26 AM EST ----- Regarding: U/S due Notes recorded by Armbruster, Carlota Raspberry, MD on 05/25/2017  Jan can you please relay to this patient that her US shows stable gallbladder polyp of 32mm - stable over the past year. Guidelines recommend surveillance annually for gallbladder polyps between 6-65mm in size. Can you please let her know and place recall for repeat RUQ [name] in 1 year. Thanks

## 2018-05-26 ENCOUNTER — Ambulatory Visit (INDEPENDENT_AMBULATORY_CARE_PROVIDER_SITE_OTHER): Payer: Medicare Other | Admitting: Podiatry

## 2018-05-26 ENCOUNTER — Other Ambulatory Visit: Payer: Self-pay | Admitting: Podiatry

## 2018-05-26 ENCOUNTER — Ambulatory Visit (INDEPENDENT_AMBULATORY_CARE_PROVIDER_SITE_OTHER): Payer: Medicare Other

## 2018-05-26 DIAGNOSIS — M79672 Pain in left foot: Secondary | ICD-10-CM | POA: Diagnosis not present

## 2018-05-26 DIAGNOSIS — M6740 Ganglion, unspecified site: Secondary | ICD-10-CM | POA: Diagnosis not present

## 2018-05-26 MED ORDER — TRIAMCINOLONE ACETONIDE 10 MG/ML IJ SUSP
10.0000 mg | Freq: Once | INTRAMUSCULAR | Status: AC
  Administered 2018-05-26: 10 mg

## 2018-05-26 NOTE — Patient Instructions (Signed)
Ganglion Cyst    A ganglion cyst is a non-cancerous, fluid-filled lump that occurs near a joint or tendon. The cyst grows out of a joint or the lining of a tendon. Ganglion cysts most often develop in the hand or wrist, but they can also develop in the shoulder, elbow, hip, knee, ankle, or foot.  Ganglion cysts are ball-shaped or egg-shaped. Their size can range from the size of a pea to larger than a grape. Increased activity may cause the cyst to get bigger because more fluid starts to build up.  What are the causes?  The exact cause of this condition is not known, but it may be related to:   Inflammation or irritation around the joint.   An injury.   Repetitive movements or overuse.   Arthritis.  What increases the risk?  You are more likely to develop this condition if:   You are a woman.   You are 15-40 years old.  What are the signs or symptoms?  The main symptom of this condition is a lump. It most often appears on the hand or wrist. In many cases, there are no other symptoms, but a cyst can sometimes cause:   Tingling.   Pain.   Numbness.   Muscle weakness.   Weak grip.   Less range of motion in a joint.  How is this diagnosed?  Ganglion cysts are usually diagnosed based on a physical exam. Your health care provider will feel the lump and may shine a light next to it. If it is a ganglion cyst, the light will likely shine through it.  Your health care provider may order an X-ray, ultrasound, or MRI to rule out other conditions.  How is this treated?  Ganglion cysts often go away on their own without treatment. If you have pain or other symptoms, treatment may be needed. Treatment is also needed if the ganglion cyst limits your movement or if it gets infected. Treatment may include:   Wearing a brace or splint on your wrist or finger.   Taking anti-inflammatory medicine.   Having fluid drained from the lump with a needle (aspiration).   Getting a steroid injected into the joint.   Having  surgery to remove the ganglion cyst.   Placing a pad on your shoe or wearing shoes that will not rub against the cyst if it is on your foot.  Follow these instructions at home:   Do not press on the ganglion cyst, poke it with a needle, or hit it.   Take over-the-counter and prescription medicines only as told by your health care provider.   If you have a brace or splint:  ? Wear it as told by your health care provider.  ? Remove it as told by your health care provider. Ask if you need to remove it when you take a shower or a bath.   Watch your ganglion cyst for any changes.   Keep all follow-up visits as told by your health care provider. This is important.  Contact a health care provider if:   Your ganglion cyst becomes larger or more painful.   You have pus coming from the lump.   You have weakness or numbness in the affected area.   You have a fever or chills.  Get help right away if:   You have a fever and have any of these in the cyst area:  ? Increased redness.  ? Red streaks.  ? Swelling.  Summary     A ganglion cyst is a non-cancerous, fluid-filled lump that occurs near a joint or tendon.   Ganglion cysts most often develop in the hand or wrist, but they can also develop in the shoulder, elbow, hip, knee, ankle, or foot.   Ganglion cysts often go away on their own without treatment.  This information is not intended to replace advice given to you by your health care provider. Make sure you discuss any questions you have with your health care provider.  Document Released: 04/17/2000 Document Revised: 12/18/2016 Document Reviewed: 12/18/2016  Elsevier Interactive Patient Education  2019 [employer]

## 2018-05-31 ENCOUNTER — Ambulatory Visit (HOSPITAL_COMMUNITY)
Admission: RE | Admit: 2018-05-31 | Discharge: 2018-05-31 | Disposition: A | Discharge status: Home / Self Care | Payer: Medicare Other | Source: Ambulatory Visit | Attending: Gastroenterology | Admitting: Gastroenterology

## 2018-05-31 DIAGNOSIS — K824 Cholesterolosis of gallbladder: Secondary | ICD-10-CM

## 2018-06-01 NOTE — Progress Notes (Signed)
Subjective: 48 year old female presents today for new concerns of a "knot" on the top of her left foot.  This been ongoing for last 2 weeks but she states that it is painful at timespressure in shoes.  She denies any recent injury or trauma she denies any swelling.  No recent treatment.  She has no other concerns. Denies any systemic complaints such as fevers, chills, nausea, vomiting. No acute changes since last appointment, and no other complaints at this time.   Objective: AAO x3, NAD DP/PT pulses palpable bilaterally, CRT less than 3 seconds On the dorsal lateral aspect of the patient's left foot there is a mobile soft tissue mass consistent with a ganglion cyst.  There is discomfort to palpation of this area.  No other areas of tenderness elicited today.  No other lesions identified.  No open lesions or pre-ulcerative lesions.  No pain with calf compression, swelling, warmth, erythema  Assessment: Left foot ganglion cyst  Plan: -All treatment options discussed with the patient including all alternatives, risks, complications.  -X-rays were obtained and reviewed.  No calcifications, fracture is evident today. -Aspiration was performed today.  The area was anesthetized with a mixture of lidocaine, Marcaine plain in a regional block fashion.  Next the skin was prepped with Betadine.  An 18-gauge needle was utilized to aspirate the cyst and clear, jellylike fluid was expressed consistent with a ganglion cyst.  Next a steroid injection was performed into the area with a 1 cc of Kenalog 10, 0.5% Marcaine plain followed by  a compression bandage.  Post procedure instructions discussed.  Monitor for any signs or symptoms of infection. -Patient encouraged to call the office with any questions, concerns, change in symptoms.   Trula Slade DPM

## 2018-06-10 ENCOUNTER — Encounter: Payer: Self-pay | Admitting: Family Medicine

## 2018-06-13 ENCOUNTER — Other Ambulatory Visit: Payer: Self-pay | Admitting: Family Medicine

## 2018-06-13 NOTE — Telephone Encounter (Signed)
Refill already sent.

## 2018-06-20 ENCOUNTER — Other Ambulatory Visit: Payer: Self-pay | Admitting: *Deleted

## 2018-06-28 ENCOUNTER — Ambulatory Visit: Payer: Medicare Other | Admitting: Allergy & Immunology

## 2018-07-04 ENCOUNTER — Ambulatory Visit (INDEPENDENT_AMBULATORY_CARE_PROVIDER_SITE_OTHER): Payer: Medicare Other | Admitting: Physical Medicine and Rehabilitation

## 2018-07-04 ENCOUNTER — Ambulatory Visit (INDEPENDENT_AMBULATORY_CARE_PROVIDER_SITE_OTHER): Payer: Self-pay

## 2018-07-04 ENCOUNTER — Encounter (INDEPENDENT_AMBULATORY_CARE_PROVIDER_SITE_OTHER): Payer: Self-pay | Admitting: Physical Medicine and Rehabilitation

## 2018-07-04 VITALS — BP 117/68 | HR 74 | Temp 98.5°F

## 2018-07-04 DIAGNOSIS — M47816 Spondylosis without myelopathy or radiculopathy, lumbar region: Secondary | ICD-10-CM | POA: Diagnosis not present

## 2018-07-04 MED ORDER — METHYLPREDNISOLONE ACETATE 80 MG/ML IJ SUSP
80.0000 mg | Freq: Once | INTRAMUSCULAR | Status: AC
  Administered 2018-07-04: 80 mg

## 2018-07-04 NOTE — Progress Notes (Signed)
Crystal Garza - 48 y.o. female MRN [id number]  Date of birth: 1971/03/23  Office Visit Note: Visit Date: 07/04/2018 PCP: Crystal La, MD Referred by: Crystal La, MD  Subjective: No chief complaint on file.  HPI: Crystal Garza is a 48 y.o. female who comes in today For planned L4-5 intra-articular facet joint blocks bilaterally.  Patient is well-known to [name] with history of chronic back pain for several years with severe arthritis and small listhesis of L4 on L5.  She is morbidly obese but has lost some weight over the last year or so.  She still weighs 296 pounds.  She is getting some symptoms down the legs at times but is more in the thighs.  Mostly low back pain with extension and walking.  Please see our prior notes for further details justification.  Patient is on chronic oxycodone 10 mg 4 times per day as well as Xanax which can be a high risk combination.  This is managed by her primary care physician.  Reports 7 out of 10 pain despite medication.  ROS Otherwise per HPI.  Assessment & Plan: Visit Diagnoses:  1. Spondylosis without myelopathy or radiculopathy, lumbar region     Plan: No additional findings.   Meds & Orders:  Meds ordered this encounter  Medications  . methylPREDNISolone acetate (DEPO-MEDROL) injection 80 mg    Orders Placed This Encounter  Procedures  . Facet Injection  . XR C-ARM NO REPORT    Follow-up: Return if symptoms worsen or fail to improve.   Procedures: No procedures performed  Lumbar Facet Joint Intra-Articular Injection(s) with Fluoroscopic Guidance  Patient: Crystal Garza      Date of Birth: Apr 01, 1971 MRN: [id number] PCP: Crystal La, MD      Visit Date: 07/04/2018   Universal Protocol:    Date/Time: 07/04/2018  Consent Given By: the patient  Position: PRONE   Additional Comments: Vital signs were monitored before and after the procedure. Patient was prepped and draped in the usual sterile fashion. The correct patient,  procedure, and site was verified.   Injection Procedure Details:  Procedure Site One Meds Administered:  Meds ordered this encounter  Medications  . methylPREDNISolone acetate (DEPO-MEDROL) injection 80 mg     Laterality: Bilateral  Location/Site:  L4-L5  Needle size: 22 guage  Needle type: Spinal  Needle Placement: Articular  Findings:  -Comments: Excellent flow of contrast producing a partial arthrogram.  Procedure Details: The fluoroscope beam is vertically oriented in AP, and the inferior recess is visualized beneath the lower pole of the inferior apophyseal process, which represents the target point for needle insertion. When direct visualization is difficult the target point is located at the medial projection of the vertebral pedicle. The region overlying each aforementioned target is locally anesthetized with a 1 to 2 ml. volume of 1% Lidocaine without Epinephrine.   The spinal needle was inserted into each of the above mentioned facet joints using biplanar fluoroscopic guidance. A 0.25 to 0.5 ml. volume of Isovue-250 was injected and a partial facet joint arthrogram was obtained. A single spot film was obtained of the resulting arthrogram.    One to 1.25 ml of the steroid/anesthetic solution was then injected into each of the facet joints noted above.   Additional Comments:  The patient tolerated the procedure well Dressing: 2 x 2 sterile gauze and Band-Aid    Post-procedure details: Patient was observed during the procedure. Post-procedure instructions were reviewed.  Patient left the  clinic in stable condition.    Clinical History: MRI LUMBAR SPINE WITHOUT CONTRAST  TECHNIQUE: Multiplanar, multisequence MR imaging of the lumbar spine was performed. No intravenous contrast was administered.  COMPARISON:  CT 01/16/2014  FINDINGS: There is no abnormality at L3-4 or above. The discs are normal. The canal and foramina are widely patent. The distal cord  and conus are normal with conus tip at L1.  L4-5: There is bilateral facet degeneration with facet and ligamentous hypertrophy. There is anterolisthesis of 2 mm. The disc bulges diffusely. No apparent compressive stenosis. There is mild discogenic endplate edema which could be associated with back pain.  L5-S1: No disc pathology. Mild facet degeneration and hypertrophy. No stenosis.  IMPRESSION: Findings at L4-5 that could relate to back pain. Bilateral facet degeneration with 2 mm of anterolisthesis. Degeneration and bulging of the disc. Discogenic endplate edema. No apparent stenosis or neural compression.   Electronically Signed   By: Crystal Garza M.D.   On: 05/06/2014 09:32   She reports that she has never smoked. She has never used smokeless tobacco. No results for input(s): HGBA1C, LABURIC in the last 8760 hours.  Objective:  VS:  HT:    WT:   BMI:     BP:117/68  HR:74bpm  TEMP:98.5 F (36.9 C)(Oral)  RESP:  Physical Exam  Ortho Exam Imaging: Xr C-arm No Report  Result Date: 07/04/2018 Please see Notes tab for imaging impression.   Past Medical/Family/Surgical/Social History: Medications & Allergies reviewed per EMR, new medications updated. Patient Active Problem List   Diagnosis Date Noted  . 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 History of pulmonary embolism 05/25/2017  . Angina, class III ([place]) 05/25/2017  . Symptoms of gastroesophageal reflux 11/09/2016  . Esophageal dysphagia   . Nausea without vomiting   . Family history of colon cancer   . Dizziness 11/08/2015  . 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 Chronic neck and back pain 02/27/2015  . Other chest pain 12/10/2014  . Medication management 08/22/2014  . Hematuria 08/22/2014  . Pain in joint, ankle and foot 05/23/2014  . Chronic anticoagulation 05/17/2014  . Chronic thoracic back pain 04/11/2014  . Right flank pain 01/17/2014  . Degenerative arthritis of left knee  10/06/2013  . Status post total knee replacement 10/06/2013  . Edema 07/28/2013  . Chronic pain 06/13/2013  . 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 Bipolar disorder ([place]) 01/19/2012  . Nephrolithiasis 02/20/2011  . Eczematous dermatitis 10/22/2010  . Iron deficiency anemia 06/27/2010  . Anxiety 06/27/2010  . Migraine 09/12/2009  . Morbid obesity-BMI 62 10/30/2008  . Osteoarthrosis, unspecified whether generalized or localized, involving lower leg 07/05/2007  . DEPRESSION, MAJOR, RECURRENT 07/01/2006  . HYPERTENSION, BENIGN SYSTEMIC 07/01/2006  . RHINITIS, ALLERGIC 07/01/2006  . Gastroesophageal reflux disease 07/01/2006   Past Medical History:  Diagnosis Date  . Anemia    iron def  . Anginal pain ([place])   . Anxiety   . Arthritis    left knee   . Back pain   . Benign gastrointestinal stromal tumor (GIST)   . Chronic knee pain   . Depression   . DVT (deep venous thrombosis) ([place])   . GERD (gastroesophageal reflux disease)   . History of kidney stones   . Hx of laparoscopic gastric banding   . Hypertension   . Migraine   . Morbid obesity ([place])   . PE (pulmonary embolism)  Family History  Problem Relation Age of Onset  . Diabetes Mother   . Diabetes Father   . Heart disease Father   . Colon cancer Father        brain tumor  . Colon polyps Father   . Colitis Father   . Irritable bowel syndrome Father   . Diabetes Maternal Grandmother   . Diabetes Maternal Grandfather   . Schizophrenia Maternal Grandfather   . Heart disease Paternal Uncle   . Diabetes Paternal Uncle   . Heart disease Paternal Grandmother   . Heart disease Paternal Grandfather   . Diabetes Maternal Aunt   . Diabetes Maternal Uncle   . Diabetes Paternal Aunt   . Cancer Other   . Hypertension Other    Past Surgical History:  Procedure Laterality Date  . [place] STUDY N/A 05/13/2016   Procedure: [place] STUDY;   Surgeon: Manus Gunning, MD;  Location: [facility];  Service: Gastroenterology;  Laterality: N/A;  . ABDOMINAL HYSTERECTOMY    . BIOPSY  02/16/2012   Procedure: BIOPSY;  Surgeon: [name], MD;  Location: [facility];  Service: General;;  biopsy of mass x 2  . BREATH TEK H PYLORI  07/28/2011   Procedure: [place];  Surgeon: [name], MD;  Location: [facility];  Service: General;  Laterality: N/A;  to be done at 745  . CESAREAN SECTION  05/1991  . COLONOSCOPY WITH PROPOFOL N/A 03/03/2016   Procedure: COLONOSCOPY WITH PROPOFOL;  Surgeon: Manus Gunning, MD;  Location: [facility];  Service: Gastroenterology;  Laterality: N/A;  . ESOPHAGEAL MANOMETRY N/A 05/13/2016   Procedure: ESOPHAGEAL MANOMETRY (EM);  Surgeon: Manus Gunning, MD;  [facility]: [facility];  Service: Gastroenterology;  Laterality: N/A;  . ESOPHAGOGASTRODUODENOSCOPY N/A 08/29/2012   Procedure: ESOPHAGOGASTRODUODENOSCOPY (EGD);  Surgeon: [name], MD;  Location: [facility];  Service: General;  Laterality: N/A;  . ESOPHAGOGASTRODUODENOSCOPY N/A 09/04/2014   Procedure: ESOPHAGOGASTRODUODENOSCOPY (EGD);  Surgeon: Alphonsa Overall, MD;  Location: [facility];  Service: General;  Laterality: N/A;  . ESOPHAGOGASTRODUODENOSCOPY (EGD) WITH PROPOFOL N/A 03/03/2016   Procedure: ESOPHAGOGASTRODUODENOSCOPY (EGD) WITH PROPOFOL;  Surgeon: Manus Gunning, MD;  [facility];  Service: Gastroenterology;  Laterality: N/A;  . EUS  03/03/2012   Procedure: UPPER ENDOSCOPIC ULTRASOUND (EUS) LINEAR;  Surgeon: Milus Banister, MD;  Location: [facility];  Service: Endoscopy;  Laterality: N/A;  . GASTRIC ROUX-EN-Y N/A 12/07/2017   Procedure: LAPAROSCOPIC ROUX-EN-Y GASTRIC BYPASS WITH LYSIS OF ADHESIONS AND UPPER ENDOSCOPY ERAS Pathway;  Surgeon: Johnathan Hausen, MD;  Location: [facility];  Service: General;  Laterality: N/A;  . gist    . KNEE ARTHROSCOPY    . LAPAROSCOPIC GASTRECTOMY  04/08/2012    Procedure: LAPAROSCOPIC GASTRECTOMY;  Surgeon: [name], MD;  Location: [facility];  Service: General;;  removal of GIST tumor of stomach  . LAPAROSCOPIC GASTRIC SLEEVE RESECTION N/A 08/29/2012   Procedure: LAPAROSCOPIC GASTRIC SLEEVE RESECTION;  Surgeon: [name], MD;  Location: [facility];  Service: General;  Laterality: N/A;  Sleeve Gastrectomy  . LEFT HEART CATH AND CORONARY ANGIOGRAPHY N/A 05/28/2017   Procedure: LEFT HEART CATH AND CORONARY ANGIOGRAPHY;  Surgeon: Leonie Man, MD;  Location: [facility];  Service: Cardiovascular;  Laterality: N/A;  . ROUX-EN-Y GASTRIC BYPASS    . TOTAL KNEE ARTHROPLASTY Left 10/06/2013   Procedure: LEFT TOTAL KNEE ARTHROPLASTY;  Surgeon: Mcarthur Rossetti, MD;  Location: [facility];  Service: Orthopedics;  Laterality: Left;  . TUBAL LIGATION     Social History   Occupational History  . Not on file  Tobacco Use  . Smoking status: Never Smoker  . Smokeless tobacco: Never Used  Substance and Sexual Activity  . Alcohol use: No    Alcohol/week: 0.0 standard drinks    Frequency: Never  . Drug use: No  . Sexual activity: Yes

## 2018-07-04 NOTE — Progress Notes (Signed)
 .  Numeric Pain Rating Scale and Functional Assessment Average Pain 7   In the last MONTH (on 0-10 scale) has pain interfered with the following?  1. General activity like being  able to carry out your everyday physical activities such as walking, climbing stairs, carrying groceries, or moving a chair?  Rating(4)   +Driver, -BT, -Dye Allergies.  

## 2018-07-04 NOTE — Procedures (Signed)
Lumbar Facet Joint Intra-Articular Injection(s) with Fluoroscopic Guidance  Patient: Crystal Garza      Date of Birth: 1970-08-22 MRN: [id number] PCP: Dickie La, MD      Visit Date: 07/04/2018   Universal Protocol:    Date/Time: 07/04/2018  Consent Given By: the patient  Position: PRONE   Additional Comments: Vital signs were monitored before and after the procedure. Patient was prepped and draped in the usual sterile fashion. The correct patient, procedure, and site was verified.   Injection Procedure Details:  Procedure Site One Meds Administered:  Meds ordered this encounter  Medications  . methylPREDNISolone acetate (DEPO-MEDROL) injection 80 mg     Laterality: Bilateral  Location/Site:  L4-L5  Needle size: 22 guage  Needle type: Spinal  Needle Placement: Articular  Findings:  -Comments: Excellent flow of contrast producing a partial arthrogram.  Procedure Details: The fluoroscope beam is vertically oriented in AP, and the inferior recess is visualized beneath the lower pole of the inferior apophyseal process, which represents the target point for needle insertion. When direct visualization is difficult the target point is located at the medial projection of the vertebral pedicle. The region overlying each aforementioned target is locally anesthetized with a 1 to 2 ml. volume of 1% Lidocaine without Epinephrine.   The spinal needle was inserted into each of the above mentioned facet joints using biplanar fluoroscopic guidance. A 0.25 to 0.5 ml. volume of Isovue-250 was injected and a partial facet joint arthrogram was obtained. A single spot film was obtained of the resulting arthrogram.    One to 1.25 ml of the steroid/anesthetic solution was then injected into each of the facet joints noted above.   Additional Comments:  The patient tolerated the procedure well Dressing: 2 x 2 sterile gauze and Band-Aid    Post-procedure details: Patient was observed  during the procedure. Post-procedure instructions were reviewed.  Patient left the clinic in stable condition.

## 2018-07-07 DIAGNOSIS — H5203 Hypermetropia, bilateral: Secondary | ICD-10-CM | POA: Diagnosis not present

## 2018-07-11 ENCOUNTER — Encounter: Payer: Self-pay | Admitting: Family Medicine

## 2018-07-11 ENCOUNTER — Other Ambulatory Visit: Payer: Self-pay | Admitting: Family Medicine

## 2018-07-14 ENCOUNTER — Telehealth: Payer: Self-pay

## 2018-07-14 ENCOUNTER — Other Ambulatory Visit: Payer: Self-pay | Admitting: Family Medicine

## 2018-07-14 DIAGNOSIS — H5213 Myopia, bilateral: Secondary | ICD-10-CM | POA: Diagnosis not present

## 2018-07-14 NOTE — Telephone Encounter (Signed)
Pt asking for referral for ENT. Ottis Stain, CMA

## 2018-07-14 NOTE — Progress Notes (Signed)
Pt informed. Dalisha Shively T Delenn Ahn, CMA  

## 2018-07-14 NOTE — Progress Notes (Signed)
Dear Crystal Garza Team Please let Crystal Garza know I sent Crystal Garza pain med in on 3/10 [facility]! Crystal Garza

## 2018-07-15 ENCOUNTER — Other Ambulatory Visit: Payer: Self-pay | Admitting: Family Medicine

## 2018-07-15 DIAGNOSIS — H9209 Otalgia, unspecified ear: Secondary | ICD-10-CM

## 2018-07-20 ENCOUNTER — Other Ambulatory Visit: Payer: Self-pay | Admitting: Family Medicine

## 2018-07-22 ENCOUNTER — Other Ambulatory Visit: Payer: Self-pay | Admitting: Family Medicine

## 2018-07-22 NOTE — Progress Notes (Signed)
Ref ENT has already been done

## 2018-08-05 ENCOUNTER — Encounter: Payer: Self-pay | Admitting: Family Medicine

## 2018-08-08 ENCOUNTER — Other Ambulatory Visit: Payer: Self-pay | Admitting: Family Medicine

## 2018-08-19 ENCOUNTER — Other Ambulatory Visit: Payer: Self-pay | Admitting: Cardiology

## 2018-08-19 ENCOUNTER — Other Ambulatory Visit: Payer: Self-pay | Admitting: Family Medicine

## 2018-08-19 ENCOUNTER — Other Ambulatory Visit: Payer: Self-pay | Admitting: Gastroenterology

## 2018-08-19 MED ORDER — OXYCODONE-ACETAMINOPHEN 10-325 MG PO TABS: ORAL_TABLET | ORAL | 0 refills | Status: DC

## 2018-08-22 NOTE — Telephone Encounter (Signed)
Diltiazem sent to [facility]

## 2018-08-24 ENCOUNTER — Other Ambulatory Visit: Payer: Self-pay | Admitting: Cardiology

## 2018-08-25 NOTE — Telephone Encounter (Signed)
Found medication had been ordered by Dr. Geraldo Pitter at pt's last visit.

## 2018-09-07 ENCOUNTER — Other Ambulatory Visit: Payer: Self-pay

## 2018-09-07 ENCOUNTER — Encounter: Payer: Self-pay | Admitting: Family Medicine

## 2018-09-07 ENCOUNTER — Ambulatory Visit (INDEPENDENT_AMBULATORY_CARE_PROVIDER_SITE_OTHER): Payer: Medicare Other | Admitting: Family Medicine

## 2018-09-07 VITALS — BP 120/60 | HR 77

## 2018-09-07 DIAGNOSIS — F339 Major depressive disorder, recurrent, unspecified: Secondary | ICD-10-CM | POA: Diagnosis not present

## 2018-09-07 DIAGNOSIS — R1310 Dysphagia, unspecified: Secondary | ICD-10-CM | POA: Diagnosis not present

## 2018-09-07 DIAGNOSIS — Z96652 Presence of left artificial knee joint: Secondary | ICD-10-CM

## 2018-09-07 DIAGNOSIS — R1319 Other dysphagia: Secondary | ICD-10-CM

## 2018-09-07 MED ORDER — FLUTICASONE PROPIONATE 50 MCG/ACT NA SUSP: 2.0000 | Freq: Every day | NASAL | 6 refills | Status: DC

## 2018-09-07 NOTE — Progress Notes (Addendum)
    CHIEF COMPLAINT / HPI: #1.  Bilateral ear pain.  Has not gotten any better and is on both sides now.  Chronic.  4-6 out of 10.  Wonders if she has excess wax in there.  I have given her referral to ENT but they have not gotten back with her yet.  No nasal congestion, no headache, no cough and no sore throat.  Hearing seems to be unaffected #2.  Obesity: Has gotten below 300 pounds for the first time in many years and she is quite excited about that.  Says she is just eating right doing what she is supposed to do. 3.  Chronic left knee pain has surprisingly not gotten much better with weight loss and most notably her right knee is now also bothering her.  She wonders if it is because she is more active.  She continues to work full-time. #4.  Occasionally having sticking sensation with some of her foods, particularly things such as salad and romaine lettuce.  Pain is in the upper portion of her chest.  No problems with liquids.  REVIEW OF SYSTEMS: No unusual myalgias or arthralgias other than HPI.  No fever.  No problems with sleep.  Mood is stable  PERTINENT  PMH / PSH: I have reviewed the patient's medications, allergies, past medical and surgical history, smoking status and updated in the EMR as appropriate. Status post left TKR Conversion of gastric sleeve to Roux-en-Y gastric bypass August 2019 Non-smoker OBJECTIVE:  Vital signs reviewed. GENERAL: Well-developed, well-nourished, no acute distress. CARDIOVASCULAR: Regular rate and rhythm no murmur gallop or rub LUNGS: Clear to auscultation bilaterally, no rales or wheeze. ABDOMEN: Soft positive bowel sounds NEURO: No gross focal neurological deficits. MSK: Movement of extremity x 4.  Mild right-sided knee pain more on the medial portion.  Full range of motion on the right side.  She has limited flexion on the left to 110 degrees. PSYCH: AxOx4. Good eye contact.. No psychomotor retardation or agitation. Appropriate speech fluency and  content. Asks and answers questions appropriately. Mood is congruent.    ASSESSMENT / PLAN:  Ear pain, subacute to chronic.  We will try her on fluticasone nasal spray to see if this is related to eustachian tube dysfunction.  I had already made her a referral to ENT and she will call their office.   Dysphasia.  Will refer her to gastroenterology.  She has previously seen Dr. Havery Moros

## 2018-09-07 NOTE — Assessment & Plan Note (Signed)
Currently seems stable

## 2018-09-07 NOTE — Addendum Note (Signed)
Addended byDorcas Mcmurray L on: 09/07/2018 04:17 PM   Modules accepted: Orders

## 2018-09-07 NOTE — Patient Instructions (Signed)
[facility]  Formerly [facility] [employer]  [address] [place], [place] [address]  Get Directions  Contact us   [phone]

## 2018-09-07 NOTE — Assessment & Plan Note (Signed)
Chronic pain, stable.

## 2018-09-07 NOTE — Assessment & Plan Note (Signed)
Congratulated on her significant weight loss.  She is doing really well.

## 2018-09-14 NOTE — Progress Notes (Signed)
Pt prescreened for tomorrow's appt, 5-14

## 2018-09-15 ENCOUNTER — Other Ambulatory Visit: Payer: Self-pay

## 2018-09-15 ENCOUNTER — Ambulatory Visit (INDEPENDENT_AMBULATORY_CARE_PROVIDER_SITE_OTHER): Payer: Medicare Other | Admitting: Gastroenterology

## 2018-09-15 ENCOUNTER — Encounter: Payer: Self-pay | Admitting: Gastroenterology

## 2018-09-15 VITALS — Ht 67.0 in | Wt 275.0 lb

## 2018-09-15 DIAGNOSIS — Z9884 Bariatric surgery status: Secondary | ICD-10-CM

## 2018-09-15 DIAGNOSIS — K219 Gastro-esophageal reflux disease without esophagitis: Secondary | ICD-10-CM

## 2018-09-15 DIAGNOSIS — R1310 Dysphagia, unspecified: Secondary | ICD-10-CM | POA: Diagnosis not present

## 2018-09-15 MED ORDER — DESIPRAMINE HCL 10 MG PO TABS: 20.0000 mg | ORAL_TABLET | Freq: Every day | ORAL | 1 refills | Status: DC

## 2018-09-15 MED ORDER — DEXLANSOPRAZOLE 60 MG PO CPDR: 60.0000 mg | DELAYED_RELEASE_CAPSULE | Freq: Every day | ORAL | 1 refills | Status: DC

## 2018-09-15 NOTE — Progress Notes (Signed)
Virtual Visit via Video Note  I connected with Crystal Garza on 09/15/18 at  2:00 PM EDT by a video enabled telemedicine application and verified that I am speaking with the correct person using two identifiers.   I discussed the limitations of evaluation and management by telemedicine and the availability of in person appointments. The patient expressed understanding and agreed to proceed.  THIS ENCOUNTER IS A VIRTUAL VISIT DUE TO COVID-19 - PATIENT WAS NOT SEEN IN THE OFFICE. PATIENT HAS CONSENTED TO VIRTUAL VISIT / TELEMEDICINE VISIT USING DOXIMITY   Location of patient: car  Location of provider: office Persons participating: myself, patient  HPI :  48 y/o femalewith multiple medical problems,here for a follow up visitfor dysphagia.  Prior workup for dysphagia and reflux as outlined.   EGD 2017 showed mild esophagitis at the GEJ, 2cm hiatal hernia. with evidence of gastric sleeve surgery. Biopsies negative for H pylori. She transitioned to Dexilant 60mg  daily. She has been on Dexilant since then.    Results of manometry and 24 Hr pH impedance studies Esophageal manometry  - quality of study is decreased by double swallows - normal resting LES pressure with complete relaxation - 100% of swallows had peristaltic contractions, but 50% of swallows had large breaks in peristalsis  Overall, suboptimal study due to double swallows however fragmented peristalsis noted with some poor bolus clearance, but no other major abnormality, no achalasia  24 HR pH impedance study: Study performed ON PPI (Dexilant)  Results: - Study time of 24 hrs: adequate study time - Demeester score of 5.5 (14.72 = upper limit of normal) - no evidence of pathologic reflux - Symptom index of only 33% (3/9) for acid reflux episodes, and 44% (4/9) for all reflux episodes  - most reflux episodes that occurred were weakly acidic (32/42)  Summary and Interpretation: - study consistent with controlled  acid reflux disease on PPI - symptoms reported didNOTcorrelate well with refluxepisodes - there could be a component of functional heartburn to symptoms in addition to nonspecific changes noted on manometry   The patient continues to take Xarelto for history of DVT. Since our last visit we have continued Desipramine for esophageal hypersensitivity and Dexilant as well. She had a gastric bypass with Dr. Hassell Done in August and she's done quite well with weight loss since then. Her weight went from 353 lbs to 279lbs. She continues to try to lose weight. She thinks her reflux symptoms have gotten better with weight loss and not bothering her. She has continued the desipramine and Dexilant however, she does not want to stop it as they have both helped her.   Main issue at this time is dysphagia. She has had some mild dysphagia in the past. Over the past 2-3 months she has had a different dysphagia which is new since her surgery. Only to solids, such as chicken, will feel dysphagia in mid to lower chest, pushes it through with liquids. No dysphagia to liquids or tablets. No vomiting. She had a barium swallow 5/19 pre-operatively which showed some decreased motility without a stricture noted.   Otherwise she had a an US of the RUQ on 05/31/18 which showed a stable appearing gallbladder polyp and mild fatty liver.  Colonoscopy 03/03/2016 - normal exam, no polyps, recommended a repeat colonoscopy in 5 years given strong FH of colon cancer.    Past Medical History:  Diagnosis Date  . Anemia    iron def  . Anginal pain ([place])   . Anxiety   .  Arthritis    left knee   . Back pain   . Benign gastrointestinal stromal tumor (GIST)   . Chronic knee pain   . Depression   . DVT (deep venous thrombosis) ([place])   . GERD (gastroesophageal reflux disease)   . History of kidney stones   . Hx of laparoscopic gastric banding   . Hypertension   . Migraine   . Morbid obesity ([place])   . PE (pulmonary embolism)       Past Surgical History:  Procedure Laterality Date  . [place] STUDY N/A 05/13/2016   Procedure: [place] STUDY;  Surgeon: Manus Gunning, MD;  Location: [facility];  Service: Gastroenterology;  Laterality: N/A;  . ABDOMINAL HYSTERECTOMY    . BIOPSY  02/16/2012   Procedure: BIOPSY;  Surgeon: [name], MD;  Location: [facility];  Service: General;;  biopsy of mass x 2  . BREATH TEK H PYLORI  07/28/2011   Procedure: [place];  Surgeon: [name], MD;  Location: [facility];  Service: General;  Laterality: N/A;  to be done at 745  . CESAREAN SECTION  05/1991  . COLONOSCOPY WITH PROPOFOL N/A 03/03/2016   Procedure: COLONOSCOPY WITH PROPOFOL;  Surgeon: Manus Gunning, MD;  Location: [facility];  Service: Gastroenterology;  Laterality: N/A;  . ESOPHAGEAL MANOMETRY N/A 05/13/2016   Procedure: ESOPHAGEAL MANOMETRY (EM);  Surgeon: Manus Gunning, MD;  [facility]: [facility];  Service: Gastroenterology;  Laterality: N/A;  . ESOPHAGOGASTRODUODENOSCOPY N/A 08/29/2012   Procedure: ESOPHAGOGASTRODUODENOSCOPY (EGD);  Surgeon: [name], MD;  Location: [facility];  Service: General;  Laterality: N/A;  . ESOPHAGOGASTRODUODENOSCOPY N/A 09/04/2014   Procedure: ESOPHAGOGASTRODUODENOSCOPY (EGD);  Surgeon: Alphonsa Overall, MD;  Location: [facility];  Service: General;  Laterality: N/A;  . ESOPHAGOGASTRODUODENOSCOPY (EGD) WITH PROPOFOL N/A 03/03/2016   Procedure: ESOPHAGOGASTRODUODENOSCOPY (EGD) WITH PROPOFOL;  Surgeon: Manus Gunning, MD;  [facility]: [facility];  Service: Gastroenterology;  Laterality: N/A;  . EUS  03/03/2012   Procedure: UPPER ENDOSCOPIC ULTRASOUND (EUS) LINEAR;  Surgeon: Milus Banister, MD;  Location: [facility];  Service: Endoscopy;  Laterality: N/A;  . GASTRIC ROUX-EN-Y N/A 12/07/2017   Procedure: LAPAROSCOPIC ROUX-EN-Y GASTRIC BYPASS WITH LYSIS OF ADHESIONS AND UPPER ENDOSCOPY ERAS Pathway;  Surgeon: Johnathan Hausen, MD;   Location: [facility];  Service: General;  Laterality: N/A;  . gist    . KNEE ARTHROSCOPY    . LAPAROSCOPIC GASTRECTOMY  04/08/2012   Procedure: LAPAROSCOPIC GASTRECTOMY;  Surgeon: [name], MD;  Location: [facility];  Service: General;;  removal of GIST tumor of stomach  . LAPAROSCOPIC GASTRIC SLEEVE RESECTION N/A 08/29/2012   Procedure: LAPAROSCOPIC GASTRIC SLEEVE RESECTION;  Surgeon: [name], MD;  Location: [facility];  Service: General;  Laterality: N/A;  Sleeve Gastrectomy  . LEFT HEART CATH AND CORONARY ANGIOGRAPHY N/A 05/28/2017   Procedure: LEFT HEART CATH AND CORONARY ANGIOGRAPHY;  Surgeon: Leonie Man, MD;  Location: [facility];  Service: Cardiovascular;  Laterality: N/A;  . ROUX-EN-Y GASTRIC BYPASS    . TOTAL KNEE ARTHROPLASTY Left 10/06/2013   Procedure: LEFT TOTAL KNEE ARTHROPLASTY;  Surgeon: Mcarthur Rossetti, MD;  Location: [facility];  Service: Orthopedics;  Laterality: Left;  . TUBAL LIGATION     Family History  Problem Relation Age of Onset  . Diabetes Mother   . Diabetes Father   . Heart disease Father   . Colon cancer Father  brain tumor  . Colon polyps Father   . Colitis Father   . Irritable bowel syndrome Father   . Diabetes Maternal Grandmother   . Diabetes Maternal Grandfather   . Schizophrenia Maternal Grandfather   . Heart disease Paternal Uncle   . Diabetes Paternal Uncle   . Heart disease Paternal Grandmother   . Heart disease Paternal Grandfather   . Diabetes Maternal Aunt   . Diabetes Maternal Uncle   . Diabetes Paternal Aunt   . Cancer Other   . Hypertension Other    Social History   Tobacco Use  . Smoking status: Never Smoker  . Smokeless tobacco: Never Used  Substance Use Topics  . Alcohol use: No    Alcohol/week: 0.0 standard drinks    Frequency: Never  . Drug use: No   Current Outpatient Medications  Medication Sig Dispense Refill  . desipramine (NOPRAMIN) 10 MG tablet TAKE 2 TABLETS AT BEDTIME. 180 tablet 0  .  DEXILANT 60 MG capsule TAKE (1) CAPSULE DAILY. 90 capsule 0  . fluticasone (FLONASE) 50 MCG/ACT nasal spray Place 2 sprays into both nostrils daily. 16 g 6  . lamoTRIgine (LAMICTAL) 200 MG tablet Take 1 tablet (200 mg total) by mouth at bedtime. 90 tablet 3  . lisinopril (PRINIVIL,ZESTRIL) 10 MG tablet TAKE 1 TABLET EACH DAY. 90 tablet 3  . ondansetron (ZOFRAN-ODT) 4 MG disintegrating tablet Take 1 tablet (4 mg total) by mouth every 6 (six) hours as needed for nausea or vomiting. 20 tablet 0  . oxyCODONE-acetaminophen (PERCOCET) 10-325 MG tablet Take one by mouth every 6 hours as needed for chronic pain do not fill before Sep 08 2018 120 tablet 0  . risperiDONE (RISPERDAL) 1 MG tablet Take 1 tablet (1 mg total) by mouth at bedtime. 90 tablet 3  . rivaroxaban (XARELTO) 10 MG TABS tablet Take 1 tablet (10 mg total) by mouth daily. 90 tablet 3  . tiZANidine (ZANAFLEX) 4 MG tablet Take 1 tablet (4 mg total) by mouth 2 (two) times daily as needed for muscle spasms. 60 tablet 3   No current facility-administered medications for this visit.    Allergies  Allergen Reactions  . Codeine Rash  . Geralyn Flash [Fish Allergy] Rash     Review of Systems: All systems reviewed and negative except where noted in HPI.   Lab Results  Component Value Date   WBC 10.7 (H) 02/04/2018   HGB 12.6 02/04/2018   HCT 40.8 02/04/2018   MCV 83.8 02/04/2018   PLT 522 (H) 02/04/2018    Lab Results  Component Value Date   CREATININE 0.81 02/04/2018   BUN 5 (L) 02/04/2018   NA 142 02/04/2018   K 3.0 (L) 02/04/2018   CL 106 02/04/2018   CO2 25 02/04/2018    Lab Results  Component Value Date   ALT 14 12/07/2017   AST 29 12/07/2017   ALKPHOS 61 12/07/2017   BILITOT 1.1 12/07/2017     Physical Exam: Ht 5\' 7"  (1.702 m) Comment: pt provided over the phone  Wt 275 lb (124.7 kg) Comment: pt provided over the phone  BMI 43.07 kg/m  NA   ASSESSMENT AND PLAN: 48 y/o female here for reassessment of the following:   Dysphagia / history of gastric bypass / GERD - patient with history of GERD, with component of functional heartburn based on prior pH study, as well as some nonspecific esophageal dysmotility, now s/p gastric bypass and has developed a different type of dysphagia to solids  in recent months. Counseled patient on ddx. This may be dysmotility based on her prior workup but given change in symptoms will proceed with barium swallow to ensure no interval development of stricture / stenosis following her surgery. She agreed with this. Otherwise, with weight loss her reflux has gotten better, doing well on present regimen. She feels desipramine has helped in general and wants to continue this, as well as her Dexilant for now given she is feeling better. I think this probably has more to do with her weight loss but will continue regimen and await barium study. If a clear stenosis / stricture is noted, will proceed with EGD. She agreed.   Vienna Cellar, MD [facility]

## 2018-09-15 NOTE — Patient Instructions (Addendum)
If you are age 50 or older, your body mass index should be between 23-30. Your Body mass index is 43.07 kg/m. If this is out of the aforementioned range listed, please consider follow up with your Primary Care Provider.  If you are age 67 or younger, your body mass index should be between 19-25. Your Body mass index is 43.07 kg/m. If this is out of the aformentioned range listed, please consider follow up with your Primary Care Provider.   We have sent the following medications to your pharmacy for you to pick up at your convenience: Dexilant 60mg : Desipramine 10mg : Take 2 tablets at bedtime   You have been scheduled for a Barium Esophogram at [facility] (1st floor of the hospital) on Tuesday, 09-27-18 at 10:30am. Please arrive 15 minutes prior to your appointment for registration. Make certain not to have anything to eat or drink 3 hours prior to your test. If you need to reschedule for any reason, please contact radiology at [phone] to do so. __________________________________________________________________ A barium swallow is an examination that concentrates on views of the esophagus. This tends to be a double contrast exam (barium and two liquids which, when combined, create a gas to distend the wall of the oesophagus) or single contrast (non-ionic iodine based). The study is usually tailored to your symptoms so a good history is essential. Attention is paid during the study to the form, structure and configuration of the esophagus, looking for functional disorders (such as aspiration, dysphagia, achalasia, motility and reflux) EXAMINATION You may be asked to change into a gown, depending on the type of swallow being performed. A radiologist and radiographer will perform the procedure. The radiologist will advise you of the type of contrast selected for your procedure and direct you during the exam. You will be asked to stand, sit or lie in several different positions and to hold  a small amount of fluid in your mouth before being asked to swallow while the imaging is performed .In some instances you may be asked to swallow barium coated marshmallows to assess the motility of a solid food bolus. The exam can be recorded as a digital or video fluoroscopy procedure. POST PROCEDURE It will take 1-2 days for the barium to pass through your system. To facilitate this, it is important, unless otherwise directed, to increase your fluids for the next 24-48hrs and to resume your normal diet.  This test typically takes about 30 minutes to perform. __________________________________________________________________________________  Thank you for entrusting me with your care and for choosing [facility], Dr.  Cellar

## 2018-09-21 DIAGNOSIS — H9209 Otalgia, unspecified ear: Secondary | ICD-10-CM | POA: Diagnosis not present

## 2018-09-27 ENCOUNTER — Other Ambulatory Visit: Payer: Self-pay

## 2018-09-27 ENCOUNTER — Ambulatory Visit (HOSPITAL_COMMUNITY)
Admission: RE | Admit: 2018-09-27 | Discharge: 2018-09-27 | Disposition: A | Discharge status: Home / Self Care | Payer: Medicare Other | Source: Ambulatory Visit | Attending: Gastroenterology | Admitting: Gastroenterology

## 2018-09-27 DIAGNOSIS — R1310 Dysphagia, unspecified: Secondary | ICD-10-CM | POA: Diagnosis not present

## 2018-09-27 DIAGNOSIS — K219 Gastro-esophageal reflux disease without esophagitis: Secondary | ICD-10-CM | POA: Insufficient documentation

## 2018-09-27 DIAGNOSIS — K224 Dyskinesia of esophagus: Secondary | ICD-10-CM | POA: Diagnosis not present

## 2018-09-29 ENCOUNTER — Encounter: Payer: Self-pay | Admitting: Family Medicine

## 2018-10-03 ENCOUNTER — Other Ambulatory Visit: Payer: Self-pay | Admitting: Family Medicine

## 2018-10-04 ENCOUNTER — Other Ambulatory Visit: Payer: Self-pay | Admitting: Family Medicine

## 2018-10-04 MED ORDER — OXYCODONE-ACETAMINOPHEN 10-325 MG PO TABS: ORAL_TABLET | ORAL | 0 refills | Status: DC

## 2018-10-07 ENCOUNTER — Other Ambulatory Visit: Payer: Self-pay

## 2018-10-07 ENCOUNTER — Ambulatory Visit (INDEPENDENT_AMBULATORY_CARE_PROVIDER_SITE_OTHER): Payer: Medicare Other | Admitting: Student in an Organized Health Care Education/Training Program

## 2018-10-07 ENCOUNTER — Encounter: Payer: Self-pay | Admitting: Student in an Organized Health Care Education/Training Program

## 2018-10-07 VITALS — BP 122/78 | HR 83 | Temp 98.8°F

## 2018-10-07 DIAGNOSIS — K0889 Other specified disorders of teeth and supporting structures: Secondary | ICD-10-CM

## 2018-10-07 MED ORDER — PENICILLIN V POTASSIUM 500 MG PO TABS: 500.0000 mg | ORAL_TABLET | Freq: Three times a day (TID) | ORAL | 0 refills | Status: DC

## 2018-10-07 NOTE — Assessment & Plan Note (Signed)
No convincing infection at this time however there is mild erythema and pain around a small gum abrasion. Discussed with patient and engaged in joint decision making. We will prescribe antibiotics to get her to a dental appointment next week. She agrees to call the dentist first thing on Monday. - penicillin v potassium (VEETID) 500 MG tablet; Take 1 tablet (500 mg total) by mouth 3 (three) times daily.  Dispense: 15 tablet; Refill: 0 - continue OTC mouthwash as needed - return precautions provided

## 2018-10-07 NOTE — Patient Instructions (Signed)
It was a pleasure seeing you today in our clinic. Here is the treatment plan we have discussed and agreed upon together:  I prescribed 5 days of antibiotics until you can get in with your dentist. Please call first thing on Monday to schedule an appointment.  Our clinic's number is [phone]. Please call with questions or concerns about what we discussed today.  Be well, Dr. Burr Medico

## 2018-10-07 NOTE — Progress Notes (Signed)
   CC: tooth/ gum pain  HPI: Crystal Garza is a 48 y.o. female presenting for tooth pain.  Tooth pain Patient presents with 3 days of frontal right pain.  She does not have any history of trauma to this area.  She noticed that it started turning red.  She has tried to use over-the-counter mouth rinse without relief.  She tried to call the dentist today, however they were closed.  She would like medication to help get her through till Monday when she can schedule a dental visit.  She has not had any fevers.  She has not noticed any drainage from her gums where there is a painful lesion.  No fevers, nausea, or vomiting. OTC mouthwash is not helping with pain.   Review of Symptoms:  See HPI for ROS.   CC, SH/smoking status, and VS noted.  Objective: BP 122/78   Pulse 83   Temp 98.8 F (37.1 C) (Oral)   SpO2 99%  GEN: NAD, alert, cooperative, and pleasant. MOUTH: +small red abrasion noted over anterior left lower gum. No drainage. Mild surrounding erythema. Multiple dental caries and lost teeth noted RESPIRATORY: Comfortable work of breathing, speaks in full sentences CV: Regular rate noted, distal extremities well perfused and warm without edema GI: Soft, nondistended SKIN: warm and dry, no rashes or lesions NEURO: II-XII grossly intact MSK: Moves 4 extremities equally PSYCH: AAOx3, appropriate affect  Assessment and plan:  Pain, dental No convincing infection at this time however there is mild erythema and pain around a small gum abrasion. Discussed with patient and engaged in joint decision making. We will prescribe antibiotics to get her to a dental appointment next week. She agrees to call the dentist first thing on Monday. - penicillin v potassium (VEETID) 500 MG tablet; Take 1 tablet (500 mg total) by mouth 3 (three) times daily.  Dispense: 15 tablet; Refill: 0 - continue OTC mouthwash as needed - return precautions provided    Meds ordered this encounter  Medications   . penicillin v potassium (VEETID) 500 MG tablet    Sig: Take 1 tablet (500 mg total) by mouth 3 (three) times daily.    Dispense:  15 tablet    Refill:  0     Everrett Coombe, MD,MS,  PGY3 10/07/2018 4:21 PM

## 2018-10-25 ENCOUNTER — Telehealth: Payer: Self-pay | Admitting: Gastroenterology

## 2018-10-25 NOTE — Telephone Encounter (Signed)
Okay thanks Crystal Garza 

## 2018-10-25 NOTE — Telephone Encounter (Signed)
I sent her a lengthy note previously following her barium swallow. If her dysphagia persists I can offer her an empiric EGD with dilation however I'm not certain how much it will help, if any. She does have some nonspecific dysmotility which could be causing this. To do the EGD would require her holding the Xarelto for 2 days. If she wants to proceed we can book it. If she wants to think about it and see me in clinic first to discuss, that's fine too. Thanks

## 2018-10-25 NOTE — Telephone Encounter (Signed)
Pt called wanting to schedule an egd, stated that Dr. Havery Moros recommended to have one. Pls advise if it is ok to schedule patient for one.

## 2018-10-25 NOTE — Telephone Encounter (Signed)
Called patient back, she would like to see someone in the office as soon as possible. Scheduled In Person visit with Tye Savoy NP 11/08/18 @ 1:30pm

## 2018-10-25 NOTE — Telephone Encounter (Signed)
Patient called requesting an EGD. Do you want her scheduled for one ?

## 2018-11-02 ENCOUNTER — Ambulatory Visit (INDEPENDENT_AMBULATORY_CARE_PROVIDER_SITE_OTHER): Payer: Medicare Other | Admitting: Family Medicine

## 2018-11-02 ENCOUNTER — Encounter: Payer: Self-pay | Admitting: Family Medicine

## 2018-11-02 ENCOUNTER — Other Ambulatory Visit: Payer: Self-pay

## 2018-11-02 DIAGNOSIS — F3170 Bipolar disorder, currently in remission, most recent episode unspecified: Secondary | ICD-10-CM

## 2018-11-02 DIAGNOSIS — F3130 Bipolar disorder, current episode depressed, mild or moderate severity, unspecified: Secondary | ICD-10-CM | POA: Diagnosis not present

## 2018-11-02 MED ORDER — RISPERIDONE 1 MG PO TABS: 1.0000 mg | ORAL_TABLET | Freq: Every day | ORAL | 3 refills | Status: DC

## 2018-11-02 MED ORDER — ALPRAZOLAM 1 MG PO TABS: ORAL_TABLET | ORAL | 5 refills | Status: DC

## 2018-11-02 MED ORDER — LAMOTRIGINE 200 MG PO TABS: 200.0000 mg | ORAL_TABLET | Freq: Every day | ORAL | 3 refills | Status: DC

## 2018-11-02 MED ORDER — OXYCODONE-ACETAMINOPHEN 10-325 MG PO TABS: ORAL_TABLET | ORAL | 0 refills | Status: DC

## 2018-11-02 MED ORDER — ONDANSETRON 4 MG PO TBDP: 4.0000 mg | ORAL_TABLET | Freq: Two times a day (BID) | ORAL | 3 refills | Status: DC | PRN

## 2018-11-04 NOTE — Progress Notes (Signed)
    CHIEF COMPLAINT / HPI:  1. Mood has been more lanile. Thinks she needs to go back on her previous medication regimen. Is irritable, anxious. No signifucantincrease in depression. No manic symptoms. 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 Increased stress with COVID. No SI or HI. Sleep is a little upset in that she is restless at night, fatigued in AM.  REVIEW OF SYSTEMS: See HPI  PERTINENT  PMH / PSH: I have reviewed the patient's medications, allergies, past medical and surgical history, smoking status and updated in the EMR as appropriate.   OBJECTIVE:  Vital signs reviewed. GENERAL: Well-developed, well-nourished, no acute distress. CARDIOVASCULAR: Regular rate and rhythm no murmur gallop or rub LUNGS: Clear to auscultation bilaterally, no rales or wheeze. ABDOMEN: Soft positive bowel sounds NEURO: No gross focal neurological deficits. MSK: Movement of extremity x 4. PSYCH: AxOx4. Good eye contact.. No psychomotor retardation or agitation. Appropriate speech fluency and content. Asks and answers questions appropriately. Mood is congruent.  ASSESSMENT / PLAN:  No problem-specific Assessment & Plan notes found for this encounter.

## 2018-11-04 NOTE — Assessment & Plan Note (Signed)
Has been very stable until recently. I think COVID has added significant stress. I agree with going back on her prior meds. She does not wish to goback to mental health center right now in [place] for more CBH--says she just thinks she needs to get back on her full medication regimen. We will restart that and f/u in 4 weeks. Sooner with problems. Greater than 50% of our 25 minute office visit was spent in counseling and education regarding these issues.

## 2018-11-07 ENCOUNTER — Telehealth: Payer: Self-pay

## 2018-11-07 NOTE — Telephone Encounter (Signed)
Covid-19 screening questions   Do you now or have you had a fever in the last 14 days? No  Do you have any respiratory symptoms of shortness of breath or cough now or in the last 14 days? No  Do you have any family members or close contacts with diagnosed or suspected Covid-19 in the past 14 days? no  Have you been tested for Covid-19 and found to be positive? no        

## 2018-11-08 ENCOUNTER — Ambulatory Visit (INDEPENDENT_AMBULATORY_CARE_PROVIDER_SITE_OTHER): Payer: Medicare Other | Admitting: Nurse Practitioner

## 2018-11-08 ENCOUNTER — Other Ambulatory Visit: Payer: Self-pay

## 2018-11-08 ENCOUNTER — Encounter: Payer: Self-pay | Admitting: Nurse Practitioner

## 2018-11-08 VITALS — BP 114/76 | HR 96 | Temp 98.3°F | Ht 67.0 in | Wt 283.1 lb

## 2018-11-08 DIAGNOSIS — R1310 Dysphagia, unspecified: Secondary | ICD-10-CM | POA: Diagnosis not present

## 2018-11-08 DIAGNOSIS — R1012 Left upper quadrant pain: Secondary | ICD-10-CM | POA: Diagnosis not present

## 2018-11-08 DIAGNOSIS — K5900 Constipation, unspecified: Secondary | ICD-10-CM | POA: Diagnosis not present

## 2018-11-08 NOTE — Progress Notes (Signed)
Agree with assessment and plan as outlined.  

## 2018-11-08 NOTE — Progress Notes (Signed)
Chief Complaint:    Abdominal pain  IMPRESSION and PLAN:     60.  48 year old female with 1 week history of LUQ burning and nausea immediately following any p.o. intake / medications.  The symptoms correlate with a one-week history of constipation.  It is possible that her symptoms are secondary to constipation . The constipation may be medication related - resumed Lamictal and risperidone a week ago.  -Plan is to gently purge bowels.  She takes a dose of MiraLAX every daily. Advised to increase to TID for a few days. After several bowel movements she will reassess her LUQ pain.  If better then needs more aggressive bowel regimen.  She did be maintained on twice daily MiraLAX.  If pain does not improve after resolution of constipation, patient will let us know   2.  GERD/chronic dysphagia.  Dysphagia ongoing, please refer to previous office visits with Dr. Havery Moros.  Patient is not ready to pursue EGD with dilation since she realizes that it may not help.  Unfortunately  Desipramine discontinued when patient was restarted on Lamictal and Risperdal.   -If she develops recurrent chest discomfort then will see if PCP will change above meds to something that will not interact with the Depsipramine so she can safely resume it.    HPI:     Patient is a 48 year old female with multiple medical problems. history of hypertension, morbid obesity s/p gastric bypass surgery, history of DVT/PE on Xarelto, history of GIST resected in 2013,  GERD,  chronic dysphagia, chronic constipation, FMH of colon cancer,  anxiety / depression. She is known to Dr. Havery Moros, most recently for evaluation of GERD/ dysphagia.  Her last visit with him was via telehealth on 09/15/2018 for follow up on GERD / dysphagia. Her 24 hour Impedance study was consistent with controlled acid reflux disease on PPI.  Her symptoms did not correlate with episodes of reflux.  Manometry study showed a normal resting LES /  complete  relaxation; 100% of her swallows had peristaltic contractions but 50% of the swallows had large breaks in peristalsis.   Crystal Garza comes in today for evaluation of abdominal pain and nausea.  Over the last week she has had a burning sensation in her left upper quadrant immediately after eating or taking meds.  This burning discomfort is associated with nausea.  She still has ongoing dysphagia but not interested in undergoing EGD with dilation at this point since we have already explained that it may or may not help in the absence of a stricture/stenosis.  Patient restarted Lamictal and risperidone last week after being off of them for several months.  When these meds were restarted her PCP discontinued the Desipramine.  So far she has not had any recurrent chest pain, feels like GERD symptoms are under control with the Tobias.   Samhita endorses constipation over the last week.  She takes daily MiraLAX but not helping over the last several days.  Review of systems:     No chest pain, no SOB, no fevers, no urinary sx   Past Medical History:  Diagnosis Date   Anemia    iron def   Anginal pain (HCC)    Anxiety    Arthritis    left knee    Back pain    Benign gastrointestinal stromal tumor (GIST)    Chronic knee pain    Depression    DVT (deep venous thrombosis) (HCC)    GERD (gastroesophageal reflux disease)  History of kidney stones    Hx of laparoscopic gastric banding    Hypertension    Migraine    Morbid obesity (HCC)    PE (pulmonary embolism)     Patient's surgical history, family medical history, social history, medications and allergies were all reviewed in Epic   Creatinine clearance cannot be calculated (Patient's most recent lab result is older than the maximum 21 days allowed.)  Current Outpatient Medications  Medication Sig Dispense Refill   ALPRAZolam (XANAX) 1 MG tablet TAKE (1) TABLET TWICE A DAY AS NEEDED. 60 tablet 5   dexlansoprazole (DEXILANT)  60 MG capsule Take 1 capsule (60 mg total) by mouth daily. 90 capsule 1   fluticasone (FLONASE) 50 MCG/ACT nasal spray Place 2 sprays into both nostrils daily. 16 g 6   lamoTRIgine (LAMICTAL) 200 MG tablet Take 1 tablet (200 mg total) by mouth at bedtime. 90 tablet 3   lisinopril (PRINIVIL,ZESTRIL) 10 MG tablet TAKE 1 TABLET EACH DAY. 90 tablet 3   ondansetron (ZOFRAN-ODT) 4 MG disintegrating tablet Take 1 tablet (4 mg total) by mouth 2 (two) times daily as needed for nausea or vomiting. 60 tablet 3   risperiDONE (RISPERDAL) 1 MG tablet Take 1 tablet (1 mg total) by mouth at bedtime. 90 tablet 3   rivaroxaban (XARELTO) 10 MG TABS tablet Take 1 tablet (10 mg total) by mouth daily. 90 tablet 3   oxyCODONE-acetaminophen (PERCOCET) 10-325 MG tablet Take one by mouth every 6 hours as needed for chronic pain do not fill before Sep 08 2018 120 tablet 0   No current facility-administered medications for this visit.     Physical Exam:     BP 114/76    Pulse 96    Temp 98.3 F (36.8 C)    Ht 5\' 7"  (1.702 m)    Wt 283 lb 2 oz (128.4 kg)    BMI 44.34 kg/m   GENERAL:  Pleasant female in NAD PSYCH: : Cooperative, normal affect EENT:  conjunctiva pink, mucous membranes moist, neck supple without masses CARDIAC:  RRR,  no peripheral edema PULM: Normal respiratory effort, lungs CTA bilaterally, no wheezing ABDOMEN:  Nondistended, soft, nontender. No obvious masses, no hepatomegaly,  normal bowel sounds SKIN:  turgor, no lesions seen Musculoskeletal:  Normal muscle tone, normal strength NEURO: Alert and oriented x 3, no focal neurologic deficits   Tye Savoy , NP 11/08/2018, 1:42 PM

## 2018-11-08 NOTE — Patient Instructions (Signed)
If you are age 50 or older, your body mass index should be between 23-30. Your Body mass index is 44.34 kg/m. If this is out of the aforementioned range listed, please consider follow up with your Primary Care Provider.  If you are age 88 or younger, your body mass index should be between 19-25. Your Body mass index is 44.34 kg/m. If this is out of the aformentioned range listed, please consider follow up with your Primary Care Provider.   INCREASE Miralax to three times daily for a few days until several bowel movements.  Then, decrease to twice daily.  Call if still having constipation/abdominal pain.  Thank you for choosing me and [facility].   Tye Savoy, NP

## 2018-11-17 IMAGING — US US ABDOMEN LIMITED
1 series · 14 of 25 positions shown · non-contrast
Comparison: 05/14/2016

CLINICAL DATA: Gallbladder polyp described on prior exam

EXAM:
ULTRASOUND ABDOMEN LIMITED RIGHT UPPER QUADRANT

[Series 1: us abdomen limited · 0.24mm/px · 14 of 55 slices shown]
[im 1/55]
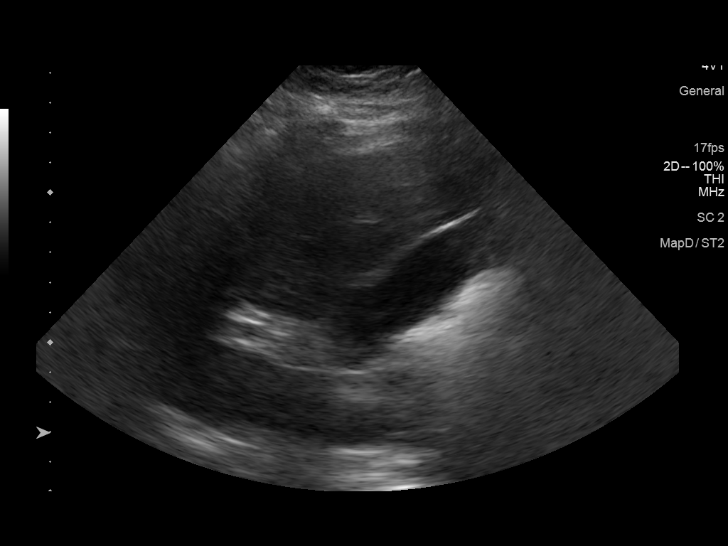
[im 5/55]
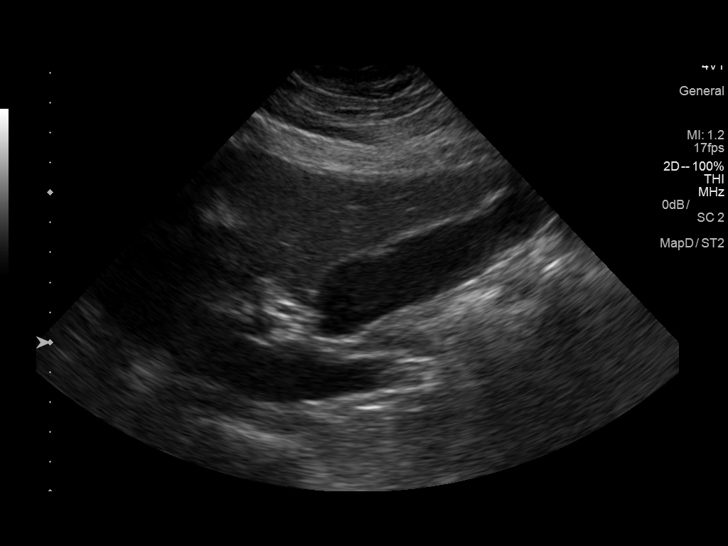
[im 10/55]
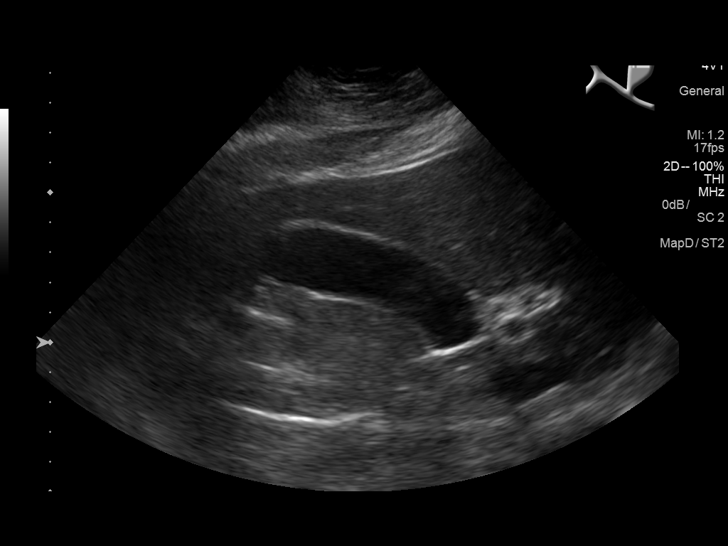
[im 14/55]
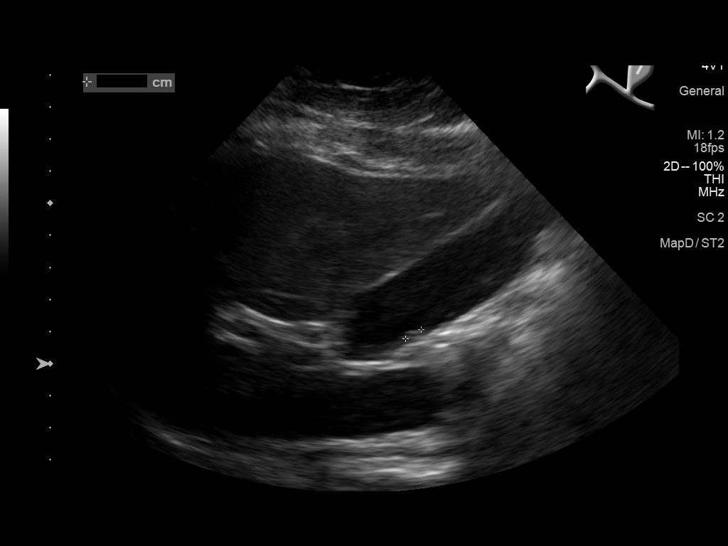
[im 19/55]
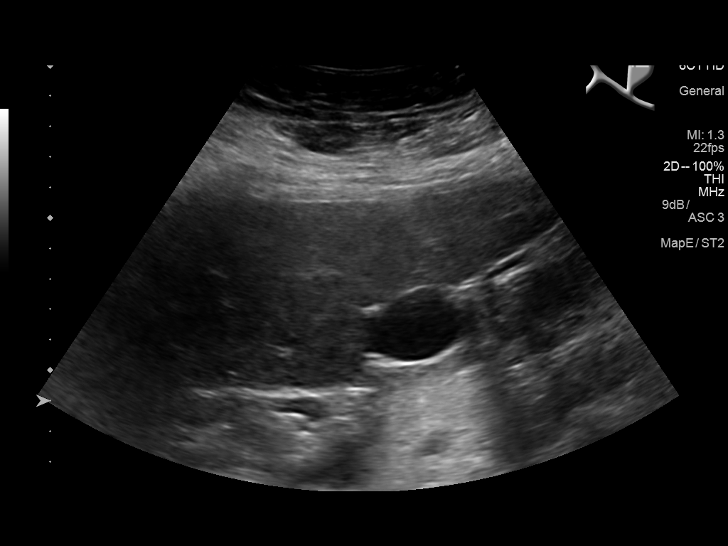
[im 21/55]
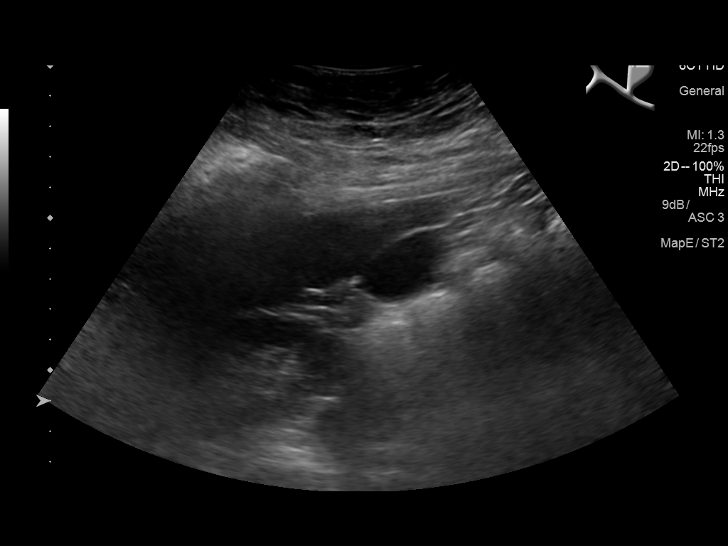
[im 25/55]
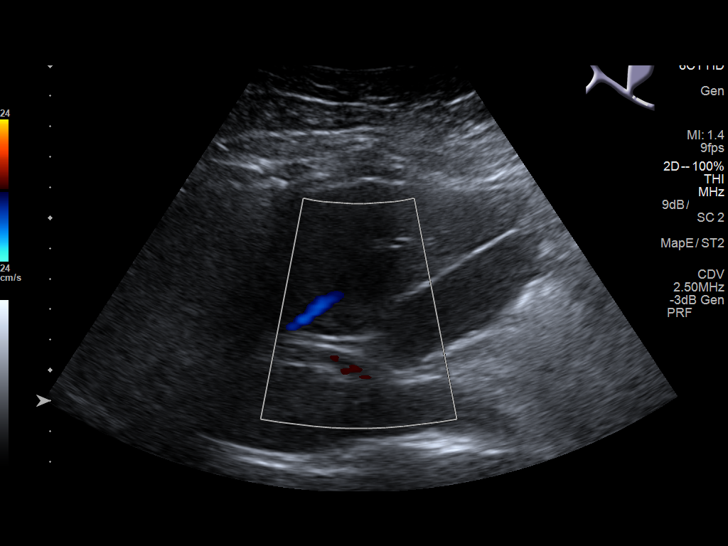
[im 30/55]
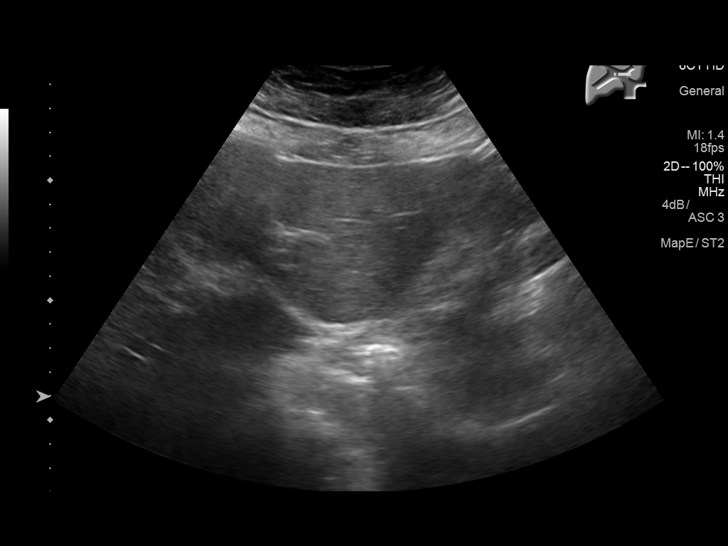
[im 34/55]
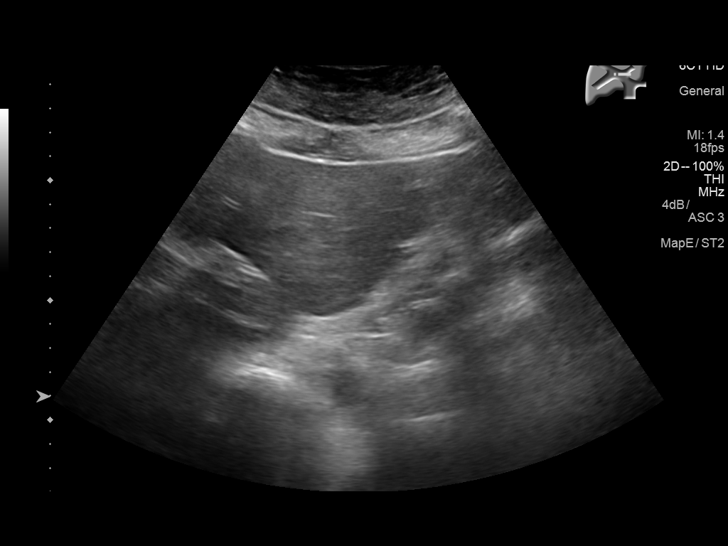
[im 37/55]
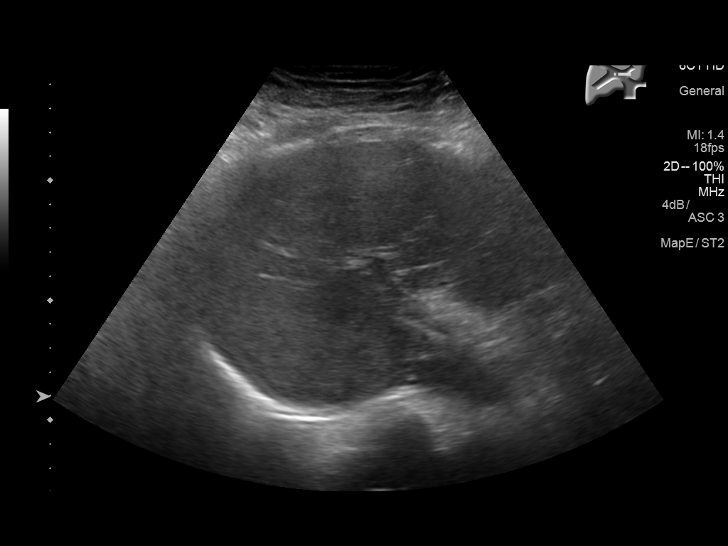
[im 41/55]
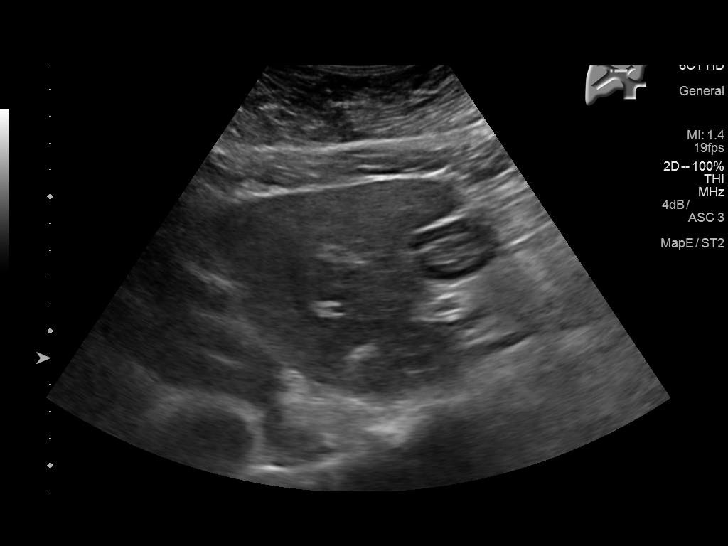
[im 46/55]
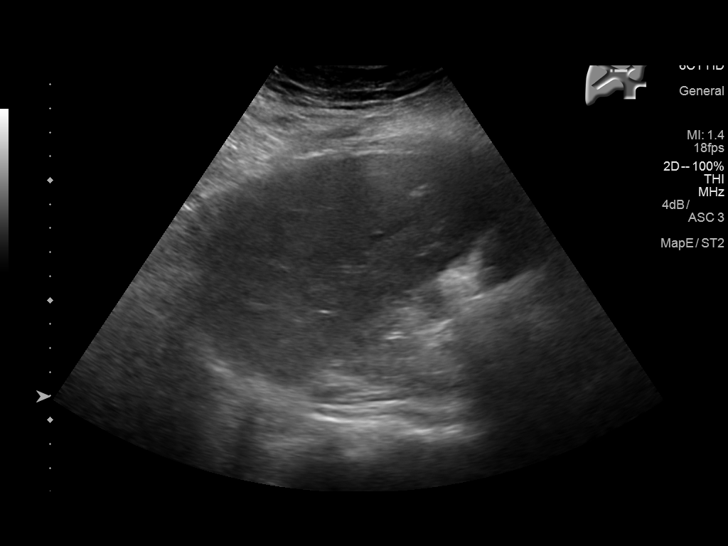
[im 50/55]
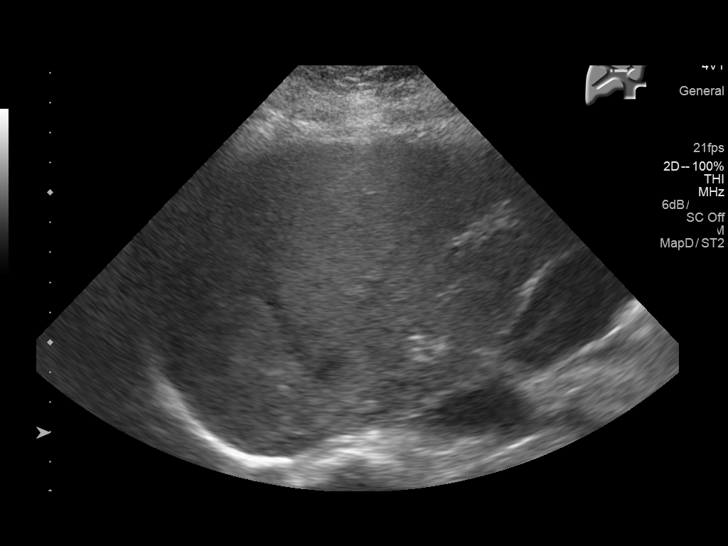
[im 55/55]
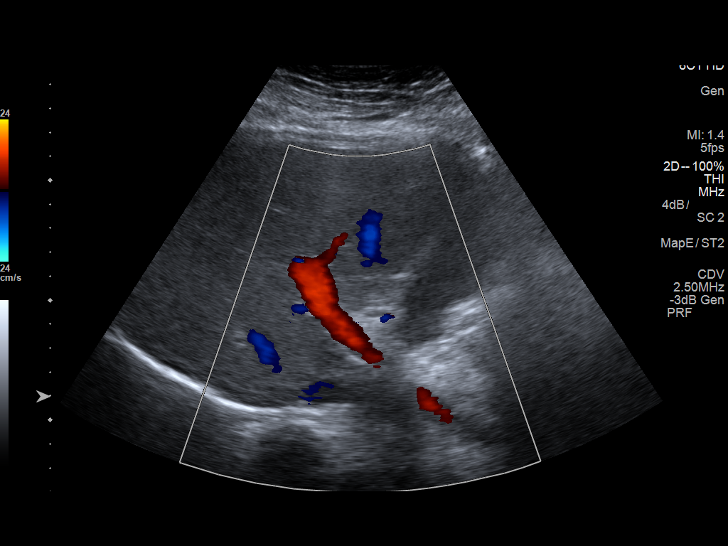

[14 of 25 positions shown; findings below may reference images not displayed]

FINDINGS: Gallbladder:

0.6 cm gallbladder wall polyp, stable. No cholelithiasis,
gallbladder wall thickening, or pericholecystic fluid. Sonographer
reports no sonographic Murphy's sign.

Common bile duct:

Diameter: 1.9 mm, unremarkable

Liver:

No focal lesion identified. Within normal limits in parenchymal
echogenicity. Portal vein is patent on color Doppler imaging with
normal direction of blood flow towards the liver.
IMPRESSION: 1. Stable small gallbladder wall polyp.
2. No gallstones or other acute findings.

## 2018-11-19 ENCOUNTER — Other Ambulatory Visit: Payer: Self-pay | Admitting: Family Medicine

## 2018-11-30 ENCOUNTER — Other Ambulatory Visit: Payer: Self-pay | Admitting: Family Medicine

## 2018-12-01 ENCOUNTER — Encounter: Payer: Self-pay | Admitting: Family Medicine

## 2018-12-05 ENCOUNTER — Other Ambulatory Visit: Payer: Self-pay | Admitting: Family Medicine

## 2018-12-05 NOTE — Telephone Encounter (Signed)
Pt has 2 days left.  Christen Bame, CMA

## 2018-12-20 ENCOUNTER — Encounter: Payer: Self-pay | Admitting: Family Medicine

## 2018-12-20 ENCOUNTER — Ambulatory Visit (INDEPENDENT_AMBULATORY_CARE_PROVIDER_SITE_OTHER): Payer: Medicare Other | Admitting: Family Medicine

## 2018-12-20 DIAGNOSIS — M546 Pain in thoracic spine: Secondary | ICD-10-CM | POA: Diagnosis not present

## 2018-12-20 DIAGNOSIS — M542 Cervicalgia: Secondary | ICD-10-CM | POA: Diagnosis not present

## 2018-12-20 DIAGNOSIS — G8929 Other chronic pain: Secondary | ICD-10-CM | POA: Diagnosis not present

## 2018-12-20 MED ORDER — PREDNISONE 10 MG PO TABS: ORAL_TABLET | ORAL | 0 refills | Status: DC

## 2018-12-20 MED ORDER — VITAMIN D-3 125 MCG (5000 UT) PO TABS: 1.0000 | ORAL_TABLET | Freq: Every day | ORAL | 3 refills | Status: DC

## 2018-12-20 MED ORDER — VITAMIN K2 100 MCG PO TABS: 1.0000 | ORAL_TABLET | Freq: Every day | ORAL | 3 refills | Status: DC

## 2018-12-20 NOTE — Progress Notes (Signed)
Office Visit Note   Patient: Crystal Garza           Date of Birth: 01-Aug-1970           MRN: [id number] Visit Date: 12/20/2018 Requested by: Dickie La, MD [address],  [place] [address] PCP: Dickie La, MD  Subjective: Chief Complaint  Patient presents with  . pain from neck to lower back x 1 week    HPI: She is here with neck and upper back pain.  Symptoms started about a week ago, no injury.  Tightness of pain with no radicular symptoms.  History of myofascial trigger points in the past, history of spondylosis requiring epidural injections.               ROS: No fevers or chills.  All other systems were reviewed and are negative.  Objective: Vital Signs: There were no vitals taken for this visit.  Physical Exam:  General:  Alert and oriented, in no acute distress. Pulm:  Breathing unlabored. Psy:  Normal mood, congruent affect. Skin: No visible rash. Neck: Full range of motion with negative Spurling's test.  She does have a tenderness in the paraspinous muscles. Thoracic spine: Diffuse myofascial tenderness throughout the thoracic spine.  No significant bony tenderness.  Imaging: None today.  Assessment & Plan: 1.  Probable myofascial neck and upper back pain -Physical therapy referral, prednisone taper.  We will presumptively treat with vitamin D.  X-rays if symptoms do not improve.     Procedures: No procedures performed  No notes on file     PMFS History: Patient Active Problem List   Diagnosis Date Noted  . Pain, dental 10/07/2018  . 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 History of pulmonary embolism 05/25/2017  . Angina, class III ([place]) 05/25/2017  . Symptoms of gastroesophageal reflux 11/09/2016  . Esophageal dysphagia   . Nausea without vomiting   . Family history of colon cancer   . Dizziness 11/08/2015  . 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 Chronic neck and back pain 02/27/2015  . Other chest pain  12/10/2014  . Medication management 08/22/2014  . Hematuria 08/22/2014  . Pain in joint, ankle and foot 05/23/2014  . Chronic anticoagulation 05/17/2014  . Chronic thoracic back pain 04/11/2014  . Right flank pain 01/17/2014  . Degenerative arthritis of left knee 10/06/2013  . Status post total knee replacement 10/06/2013  . Edema 07/28/2013  . Chronic pain 06/13/2013  . 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 Gastrointestinal stromal tumor (GIST) of stomach ([place]) 02/17/2012  . Bipolar disorder ([place]) 01/19/2012  . Nephrolithiasis 02/20/2011  . Eczematous dermatitis 10/22/2010  . Iron deficiency anemia 06/27/2010  . Anxiety 06/27/2010  . Migraine 09/12/2009  . Morbid obesity-BMI 62 10/30/2008  . Osteoarthrosis, unspecified whether generalized or localized, involving lower leg 07/05/2007  . Major depressive disorder, recurrent episode ([place]) 07/01/2006  . HYPERTENSION, BENIGN SYSTEMIC 07/01/2006  . RHINITIS, ALLERGIC 07/01/2006  . Gastroesophageal reflux disease 07/01/2006   Past Medical History:  Diagnosis Date  . Anemia    iron def  . Anginal pain ([place])   . Anxiety   . Arthritis    left knee   . Back pain   . Benign gastrointestinal stromal tumor (GIST)   . Chronic knee pain   . Depression   . DVT (deep venous thrombosis) ([place])   . GERD (gastroesophageal reflux disease)   . History of kidney stones   .  Hx of laparoscopic gastric banding   . Hypertension   . Migraine   . Morbid obesity ([place])   . PE (pulmonary embolism)     Family History  Problem Relation Age of Onset  . Diabetes Mother   . Diabetes Father   . Heart disease Father   . Colon cancer Father        brain tumor  . Colon polyps Father   . Colitis Father   . Irritable bowel syndrome Father   . Diabetes Maternal Grandmother   . Diabetes Maternal Grandfather   . Schizophrenia Maternal Grandfather   . Heart disease Paternal Uncle   . Diabetes Paternal Uncle   . Heart disease Paternal  Grandmother   . Heart disease Paternal Grandfather   . Diabetes Maternal Aunt   . Diabetes Maternal Uncle   . Diabetes Paternal Aunt   . Cancer Other   . Hypertension Other   . Esophageal cancer Neg Hx     Past Surgical History:  Procedure Laterality Date  . [place] STUDY N/A 05/13/2016   Procedure: [place] STUDY;  Surgeon: Manus Gunning, MD;  Location: [facility];  Service: Gastroenterology;  Laterality: N/A;  . ABDOMINAL HYSTERECTOMY    . BIOPSY  02/16/2012   Procedure: BIOPSY;  Surgeon: [name], MD;  Location: [facility];  Service: General;;  biopsy of mass x 2  . BREATH TEK H PYLORI  07/28/2011   Procedure: [place];  Surgeon: [name], MD;  Location: [facility];  Service: General;  Laterality: N/A;  to be done at 745  . CESAREAN SECTION  05/1991  . COLONOSCOPY WITH PROPOFOL N/A 03/03/2016   Procedure: COLONOSCOPY WITH PROPOFOL;  Surgeon: Manus Gunning, MD;  Location: [facility];  Service: Gastroenterology;  Laterality: N/A;  . ESOPHAGEAL MANOMETRY N/A 05/13/2016   Procedure: ESOPHAGEAL MANOMETRY (EM);  Surgeon: Manus Gunning, MD;  Location: [facility];  Service: Gastroenterology;  Laterality: N/A;  . ESOPHAGOGASTRODUODENOSCOPY N/A 08/29/2012   Procedure: ESOPHAGOGASTRODUODENOSCOPY (EGD);  Surgeon: [name], MD;  Location: [facility];  Service: General;  Laterality: N/A;  . ESOPHAGOGASTRODUODENOSCOPY N/A 09/04/2014   Procedure: ESOPHAGOGASTRODUODENOSCOPY (EGD);  Surgeon: Alphonsa Overall, MD;  Location: [facility];  Service: General;  Laterality: N/A;  . ESOPHAGOGASTRODUODENOSCOPY (EGD) WITH PROPOFOL N/A 03/03/2016   Procedure: ESOPHAGOGASTRODUODENOSCOPY (EGD) WITH PROPOFOL;  Surgeon: Manus Gunning, MD;  Location: [facility];  Service: Gastroenterology;  Laterality: N/A;  . EUS  03/03/2012   Procedure: UPPER ENDOSCOPIC ULTRASOUND (EUS) LINEAR;  Surgeon: Milus Banister, MD;  Location: [facility];  Service:  Endoscopy;  Laterality: N/A;  . GASTRIC ROUX-EN-Y N/A 12/07/2017   Procedure: LAPAROSCOPIC ROUX-EN-Y GASTRIC BYPASS WITH LYSIS OF ADHESIONS AND UPPER ENDOSCOPY ERAS Pathway;  Surgeon: Johnathan Hausen, MD;  Location: [facility];  Service: General;  Laterality: N/A;  . gist    . KNEE ARTHROSCOPY    . LAPAROSCOPIC GASTRECTOMY  04/08/2012   Procedure: LAPAROSCOPIC GASTRECTOMY;  Surgeon: [name], MD;  Location: [facility];  Service: General;;  removal of GIST tumor of stomach  . LAPAROSCOPIC GASTRIC SLEEVE RESECTION N/A 08/29/2012   Procedure: LAPAROSCOPIC GASTRIC SLEEVE RESECTION;  Surgeon: [name], MD;  Location: [facility];  Service: General;  Laterality: N/A;  Sleeve Gastrectomy  . LEFT HEART CATH AND CORONARY ANGIOGRAPHY N/A 05/28/2017   Procedure: LEFT HEART CATH AND CORONARY ANGIOGRAPHY;  Surgeon: Leonie Man, MD;  Location: [facility];  Service: Cardiovascular;  Laterality: N/A;  . ROUX-EN-Y GASTRIC BYPASS    . TOTAL KNEE ARTHROPLASTY Left 10/06/2013   Procedure: LEFT TOTAL KNEE ARTHROPLASTY;  Surgeon: Mcarthur Rossetti, MD;  Location: [facility];  Service: Orthopedics;  Laterality: Left;  . TUBAL LIGATION     Social History   Occupational History  . Not on file  Tobacco Use  . Smoking status: Never Smoker  . Smokeless tobacco: Never Used  Substance and Sexual Activity  . Alcohol use: No    Alcohol/week: 0.0 standard drinks    Frequency: Never  . Drug use: No  . Sexual activity: Yes

## 2018-12-26 ENCOUNTER — Telehealth: Payer: Self-pay | Admitting: Physical Medicine and Rehabilitation

## 2018-12-26 NOTE — Telephone Encounter (Signed)
ok 

## 2018-12-27 NOTE — Telephone Encounter (Signed)
Is auth needed for [id number]? Scheduled for 9/9.

## 2018-12-27 NOTE — Telephone Encounter (Signed)
Notification or Prior Authorization is not required for the requested services  This [employer] members plan does not currently require a prior authorization for 81-50  Decision ID [id number]

## 2018-12-31 ENCOUNTER — Other Ambulatory Visit: Payer: Self-pay | Admitting: Family Medicine

## 2019-01-02 ENCOUNTER — Other Ambulatory Visit: Payer: Self-pay | Admitting: Family Medicine

## 2019-01-03 MED ORDER — OXYCODONE-ACETAMINOPHEN 10-325 MG PO TABS: ORAL_TABLET | ORAL | 0 refills | Status: DC

## 2019-01-11 ENCOUNTER — Ambulatory Visit: Payer: Self-pay

## 2019-01-11 ENCOUNTER — Encounter: Payer: Self-pay | Admitting: Physical Medicine and Rehabilitation

## 2019-01-11 ENCOUNTER — Ambulatory Visit (INDEPENDENT_AMBULATORY_CARE_PROVIDER_SITE_OTHER): Payer: Medicare Other | Admitting: Physical Medicine and Rehabilitation

## 2019-01-11 VITALS — BP 131/76 | HR 69

## 2019-01-11 DIAGNOSIS — M5442 Lumbago with sciatica, left side: Secondary | ICD-10-CM | POA: Diagnosis not present

## 2019-01-11 DIAGNOSIS — M546 Pain in thoracic spine: Secondary | ICD-10-CM | POA: Diagnosis not present

## 2019-01-11 DIAGNOSIS — G894 Chronic pain syndrome: Secondary | ICD-10-CM

## 2019-01-11 DIAGNOSIS — M47816 Spondylosis without myelopathy or radiculopathy, lumbar region: Secondary | ICD-10-CM

## 2019-01-11 DIAGNOSIS — G8929 Other chronic pain: Secondary | ICD-10-CM

## 2019-01-11 MED ORDER — METHYLPREDNISOLONE ACETATE 80 MG/ML IJ SUSP: 80.0000 mg | Freq: Once | INTRAMUSCULAR | Status: DC

## 2019-01-11 NOTE — Progress Notes (Signed)
 .  Numeric Pain Rating Scale and Functional Assessment Average Pain 8   In the last MONTH (on 0-10 scale) has pain interfered with the following?  1. General activity like being  able to carry out your everyday physical activities such as walking, climbing stairs, carrying groceries, or moving a chair?  Rating(7)   +Driver, -BT, -Dye Allergies.  

## 2019-01-17 ENCOUNTER — Telehealth: Payer: Self-pay | Admitting: Radiology

## 2019-01-17 NOTE — Telephone Encounter (Signed)
Patient called and states that she did get relief for a few days, but her leg is bothering her again, she is wanting to know if she should see you or [facility] again?---Please advise

## 2019-01-17 NOTE — Telephone Encounter (Signed)
I would try one epidural first, then if no help we can figure out follow up or imaging

## 2019-01-20 NOTE — Telephone Encounter (Signed)
sched 02/06/2019 @ 3

## 2019-01-30 ENCOUNTER — Other Ambulatory Visit: Payer: Self-pay | Admitting: Family Medicine

## 2019-01-30 IMAGING — DX DG CHEST 2V
2 series · 2 of 2 positions shown · non-contrast
Comparison: No prior.

CLINICAL DATA: Heart catheterization.  Chest pain.

EXAM:
CHEST  2 VIEW

[chest pa]
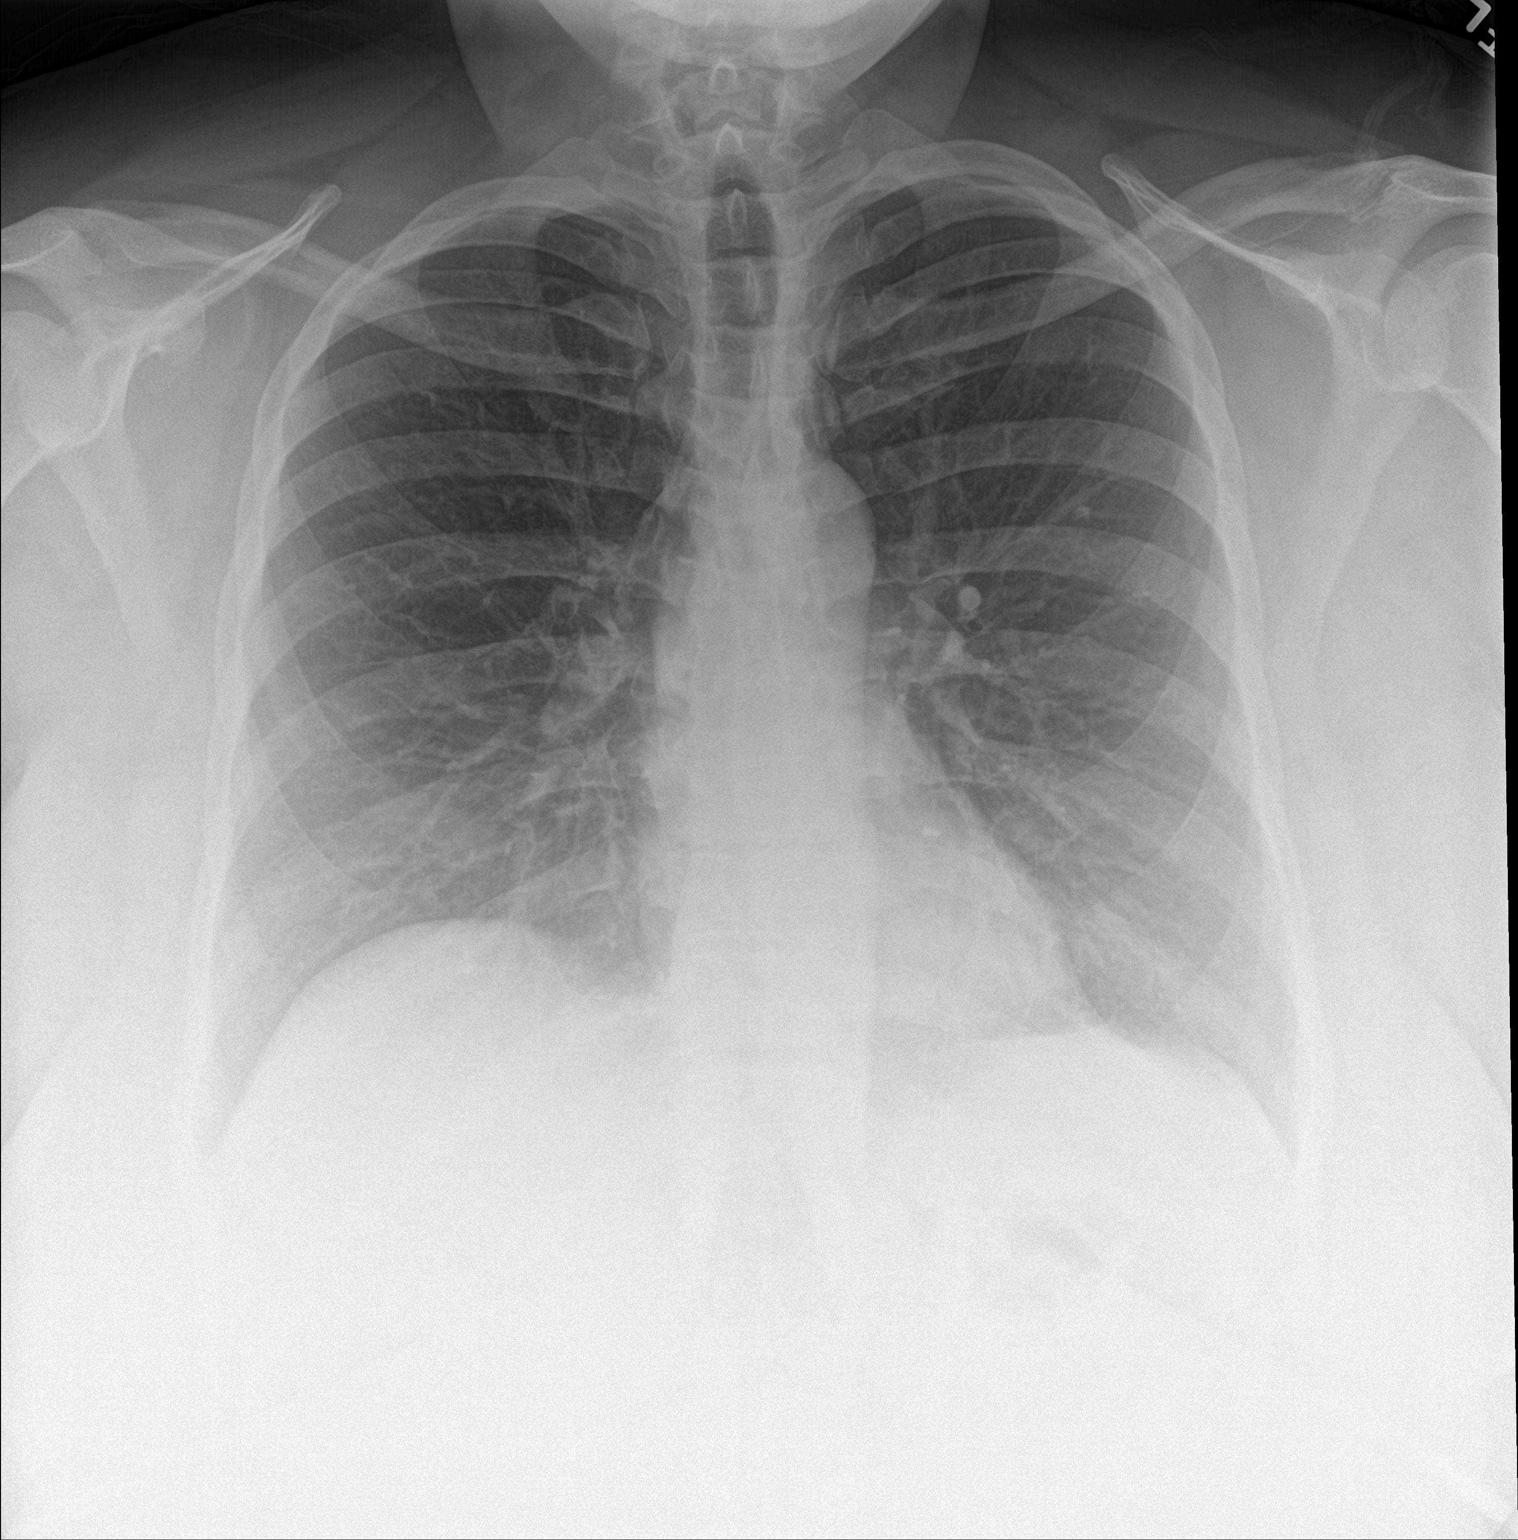

[chest lat]
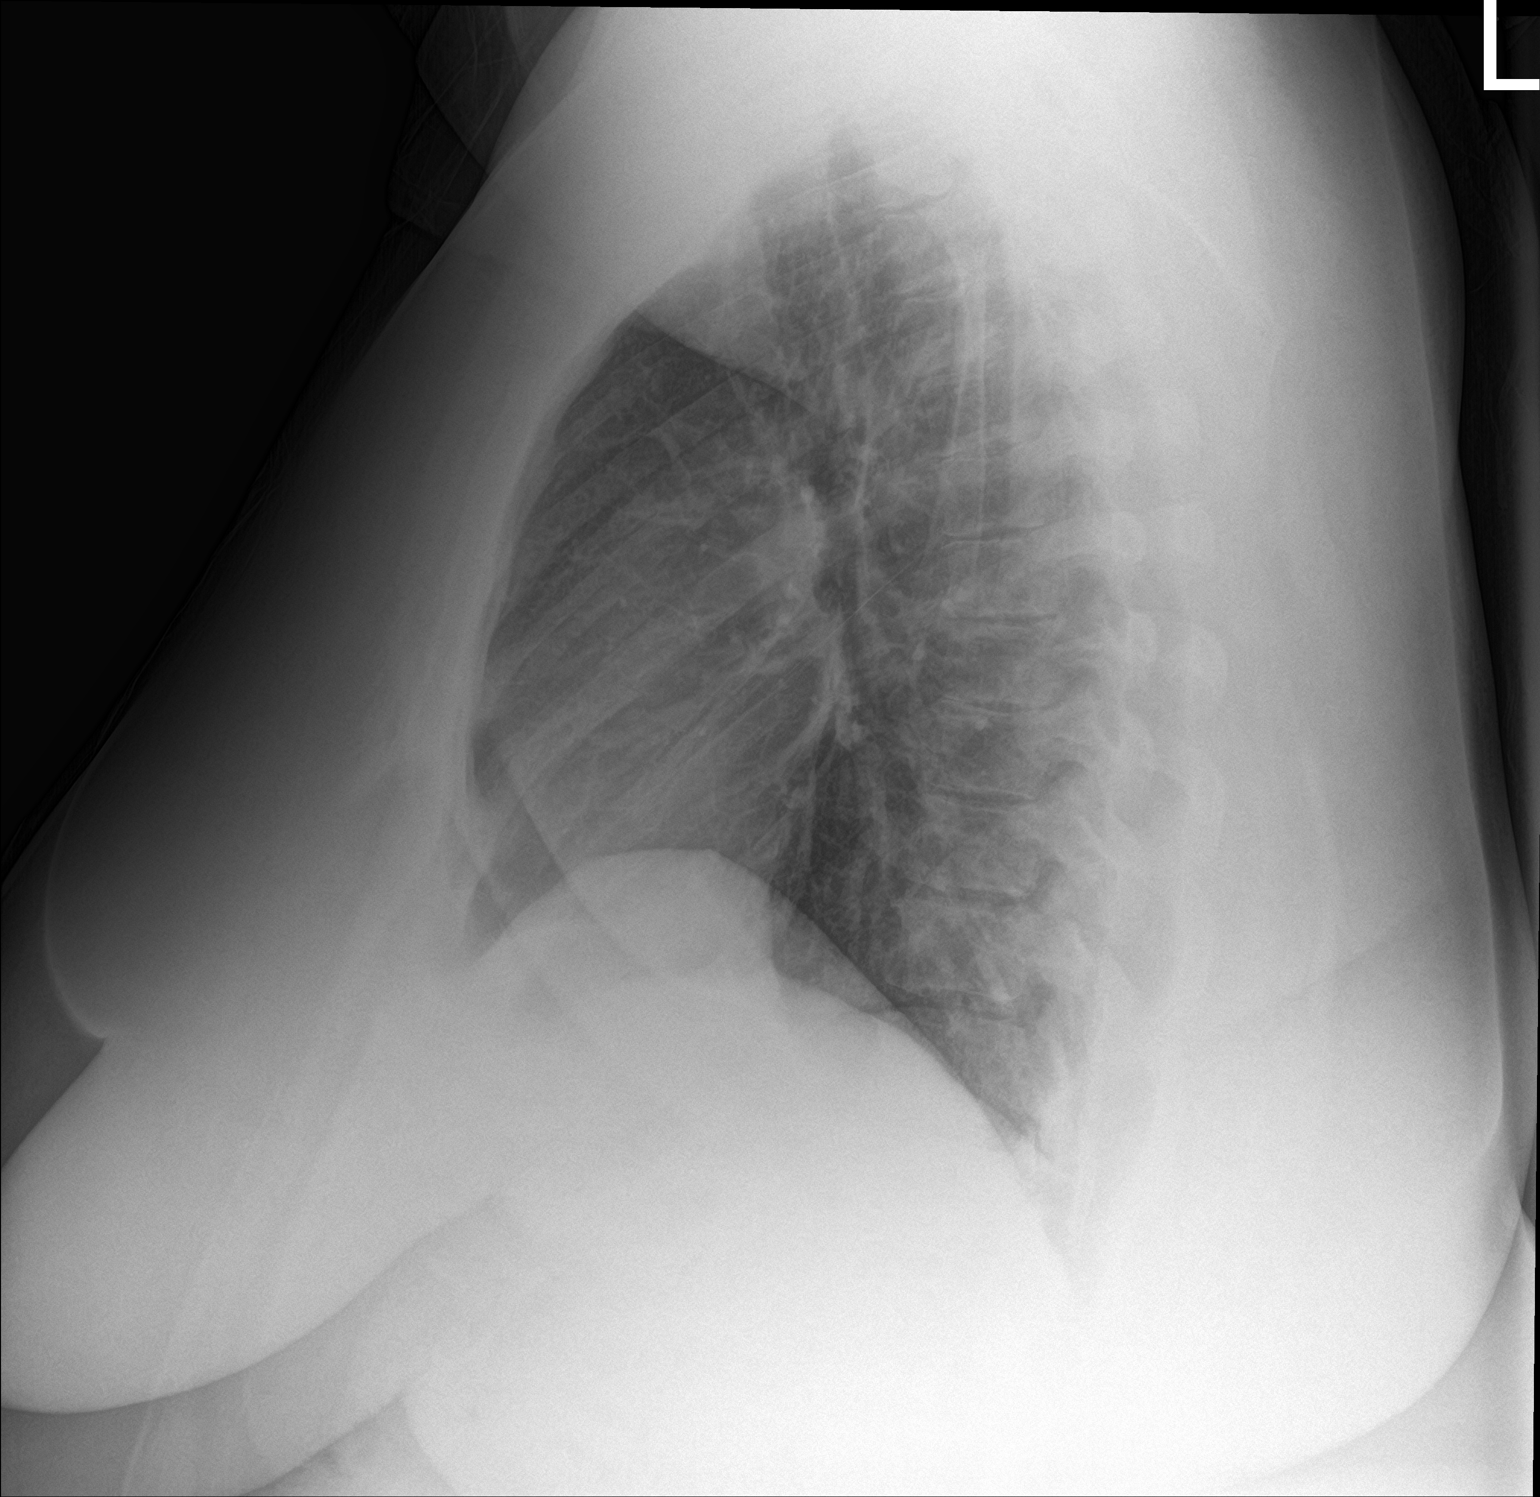

[2 of 2 positions shown; findings below may reference images not displayed]

FINDINGS: Mediastinum and hilar structures normal. Lungs are clear. No pleural
effusion or pneumothorax. Heart size normal. No acute bony
abnormality.
IMPRESSION: No acute cardiopulmonary disease.

## 2019-02-04 ENCOUNTER — Encounter: Payer: Self-pay | Admitting: Physical Medicine and Rehabilitation

## 2019-02-04 NOTE — Procedures (Signed)
Lumbar Facet Joint Intra-Articular Injection(s) with Fluoroscopic Guidance  Patient: Crystal Garza      Date of Birth: 1971/03/12 MRN: [id number] PCP: Dickie La, MD      Visit Date: 01/11/2019   Universal Protocol:    Date/Time: 01/11/2019  Consent Given By: the patient  Position: PRONE   Additional Comments: Vital signs were monitored before and after the procedure. Patient was prepped and draped in the usual sterile fashion. The correct patient, procedure, and site was verified.   Injection Procedure Details:  Procedure Site One Meds Administered:  Meds ordered this encounter  Medications  . methylPREDNISolone acetate (DEPO-MEDROL) injection 80 mg     Laterality: Bilateral  Location/Site:  L4-L5  Needle size: 22 guage  Needle type: Spinal  Needle Placement: Articular  Findings:  -Comments: Excellent flow of contrast producing a partial arthrogram.  Procedure Details: The fluoroscope beam is vertically oriented in AP, and the inferior recess is visualized beneath the lower pole of the inferior apophyseal process, which represents the target point for needle insertion. When direct visualization is difficult the target point is located at the medial projection of the vertebral pedicle. The region overlying each aforementioned target is locally anesthetized with a 1 to 2 ml. volume of 1% Lidocaine without Epinephrine.   The spinal needle was inserted into each of the above mentioned facet joints using biplanar fluoroscopic guidance. A 0.25 to 0.5 ml. volume of Isovue-250 was injected and a partial facet joint arthrogram was obtained. A single spot film was obtained of the resulting arthrogram.    One to 1.25 ml of the steroid/anesthetic solution was then injected into each of the facet joints noted above.   Additional Comments:  The patient tolerated the procedure well Dressing: 2 x 2 sterile gauze and Band-Aid    Post-procedure details: Patient was observed  during the procedure. Post-procedure instructions were reviewed.  Patient left the clinic in stable condition.

## 2019-02-04 NOTE — Progress Notes (Signed)
Crystal Garza - 48 y.o. female MRN [id number]  Date of birth: May 27, 1970  Office Visit Note: Visit Date: 01/11/2019 PCP: Dickie La, MD Referred by: Dickie La, MD  Subjective: Chief Complaint  Patient presents with  . Lower Back - Pain  . Right Leg - Pain   HPI: Crystal Garza is a 48 y.o. female who comes in today For reevaluation management of chronic worsening low back pain now with more left somewhat radicular leg pain.  Patient has been seen by [name] on many occasions through Dr. Ninfa Linden.  She typically follows with Dr. Jean Rosenthal for orthopedic complaints.  Her case is complicated by morbid obesity as well as arthritis and a history of bipolar disorder which she manages well.  She comes in today with low back pain mostly on the right with some referral in the right leg.  It somewhat nondermatomal.  This is gotten worse over the last several months.  She reports laying down actually makes it worse and sitting up seems to relieve the pain some.  She still having pain with standing for long periods.  She has had no focal weakness or trauma.  She has had no bowel or bladder changes.  She gets some relief with medication management and rest and change position.  She has had therapy in the past and continues to try to have home exercises.  She continues to try to lose weight.  More recently she saw Dr. Legrand Como hilts for more thoracic pain which she still having.  We saw her last in March and completed L4-5 facet joint block.  We have been doing this on a fairly routine basis a couple times a year as she is done fairly well.  MRI from 2016 shows mainly facet arthropathy with small listhesis.  Review of Systems  Constitutional: Negative for chills, fever, malaise/fatigue and weight loss.  HENT: Negative for hearing loss and sinus pain.   Eyes: Negative for blurred vision, double vision and photophobia.  Respiratory: Negative for cough and shortness of breath.   Cardiovascular:  Negative for chest pain, palpitations and leg swelling.  Gastrointestinal: Negative for abdominal pain, nausea and vomiting.  Genitourinary: Negative for flank pain.  Musculoskeletal: Positive for back pain and joint pain. Negative for myalgias.       Right leg pain  Skin: Negative for itching and rash.  Neurological: Negative for tremors, focal weakness and weakness.  Endo/Heme/Allergies: Negative.   Psychiatric/Behavioral: Negative for depression.  All other systems reviewed and are negative.  Otherwise per HPI.  Assessment & Plan: Visit Diagnoses:  1. Spondylosis without myelopathy or radiculopathy, lumbar region   2. Chronic bilateral low back pain with left-sided sciatica   3. Chronic pain syndrome   4. Chronic midline thoracic back pain   5. Morbid (severe) obesity due to excess calories (HCC)     Plan: Findings:  Chronic pain syndrome and chronic worsening low back pain with right somewhat radicular nondermatomal referral pattern.  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  MRI from 2016 without compressive nerve injury.  At this point I think repeating the L4-5 facet joint blocks is worthwhile they have helped her in the past.  If she does not get much relief with that from the leg standpoint we would look at either epidural injection versus updating lumbar spine MRI.  I would be somewhat worried that there may be more stenosis here over the last 4 years.  If she did not get better with the epidural if we decided to go that route then she definitely needs a new MRI.  We cannot really complete radiofrequency ablation of her facet joints Duda body habitus.  She will continue with current medication and exercises.    Meds & Orders:  Meds ordered this encounter  Medications  . methylPREDNISolone acetate (DEPO-MEDROL) injection 80 mg    Orders Placed This Encounter  Procedures  . Facet Injection  . XR C-ARM NO REPORT    Follow-up: No follow-ups on  file.   Procedures: No procedures performed  Lumbar Facet Joint Intra-Articular Injection(s) with Fluoroscopic Guidance  Patient: Crystal Garza      Date of Birth: 01/24/1971 MRN: [id number] PCP: Dickie La, MD      Visit Date: 01/11/2019   Universal Protocol:    Date/Time: 01/11/2019  Consent Given By: the patient  Position: PRONE   Additional Comments: Vital signs were monitored before and after the procedure. Patient was prepped and draped in the usual sterile fashion. The correct patient, procedure, and site was verified.   Injection Procedure Details:  Procedure Site One Meds Administered:  Meds ordered this encounter  Medications  . methylPREDNISolone acetate (DEPO-MEDROL) injection 80 mg     Laterality: Bilateral  Location/Site:  L4-L5  Needle size: 22 guage  Needle type: Spinal  Needle Placement: Articular  Findings:  -Comments: Excellent flow of contrast producing a partial arthrogram.  Procedure Details: The fluoroscope beam is vertically oriented in AP, and the inferior recess is visualized beneath the lower pole of the inferior apophyseal process, which represents the target point for needle insertion. When direct visualization is difficult the target point is located at the medial projection of the vertebral pedicle. The region overlying each aforementioned target is locally anesthetized with a 1 to 2 ml. volume of 1% Lidocaine without Epinephrine.   The spinal needle was inserted into each of the above mentioned facet joints using biplanar fluoroscopic guidance. A 0.25 to 0.5 ml. volume of Isovue-250 was injected and a partial facet joint arthrogram was obtained. A single spot film was obtained of the resulting arthrogram.    One to 1.25 ml of the steroid/anesthetic solution was then injected into each of the facet joints noted above.   Additional Comments:  The patient tolerated the procedure well Dressing: 2 x 2 sterile gauze and Band-Aid     Post-procedure details: Patient was observed during the procedure. Post-procedure instructions were reviewed.  Patient left the clinic in stable condition.    Clinical History: MRI LUMBAR SPINE WITHOUT CONTRAST  TECHNIQUE: Multiplanar, multisequence MR imaging of the lumbar spine was performed. No intravenous contrast was administered.  COMPARISON:  CT 01/16/2014  FINDINGS: There is no abnormality at L3-4 or above. The discs are normal. The canal and foramina are widely patent. The distal cord and conus are normal with conus tip at L1.  L4-5: There is bilateral facet degeneration with facet and ligamentous hypertrophy. There is anterolisthesis of 2 mm. The disc bulges diffusely. No apparent compressive stenosis. There is mild discogenic endplate edema which could be associated with back pain.  L5-S1: No disc pathology. Mild facet degeneration and hypertrophy. No stenosis.  IMPRESSION: Findings at L4-5 that could relate to back pain. Bilateral facet degeneration with 2 mm of anterolisthesis. Degeneration and bulging of the disc. Discogenic endplate edema. No apparent stenosis or neural compression.   Electronically Signed   By: Nelson Chimes M.D.   On: 05/06/2014 09:32  She reports that she has never smoked. She has never used smokeless tobacco. No results for input(s): HGBA1C, LABURIC in the last 8760 hours.  Objective:  VS:  HT:    WT:   BMI:     BP:131/76  HR:69bpm  TEMP: ( )  RESP:  Physical Exam Vitals signs and nursing note reviewed.  Constitutional:      General: She is not in acute distress.    Appearance: Normal appearance. She is well-developed. She is obese. She is not ill-appearing.  HENT:     Head: Normocephalic and atraumatic.  Eyes:     Conjunctiva/sclera: Conjunctivae normal.     Pupils: Pupils are equal, round, and reactive to light.  Cardiovascular:     Rate and Rhythm: Normal rate.     Pulses: Normal pulses.  Pulmonary:      Effort: Pulmonary effort is normal.  Musculoskeletal:     Right lower leg: No edema.     Left lower leg: No edema.     Comments: Patient ambulates without aid she is slow to rise from seated position to full extension she does have pain with facet loading and rotation.  No myofascial trigger points although those are hard to palpate.  No pain over the greater trochanters.  No pain with hip rotation she has good distal strength.  Equivocal slump test on the right.  Skin:    General: Skin is warm and dry.     Findings: No erythema or rash.  Neurological:     General: No focal deficit present.     Mental Status: She is alert and oriented to person, place, and time.     Sensory: No sensory deficit.     Motor: No abnormal muscle tone.     Coordination: Coordination normal.     Gait: Gait normal.  Psychiatric:        Mood and Affect: Mood normal.        Behavior: Behavior normal.     Ortho Exam Imaging: No results found.  Past Medical/Family/Surgical/Social History: Medications & Allergies reviewed per EMR, new medications updated. Patient Active Problem List   Diagnosis Date Noted  . Pain, dental 10/07/2018  . 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 History of pulmonary embolism 05/25/2017  . Angina, class III ([place]) 05/25/2017  . Symptoms of gastroesophageal reflux 11/09/2016  . Esophageal dysphagia   . Nausea without vomiting   . Family history of colon cancer   . Dizziness 11/08/2015  . 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 Chronic neck and back pain 02/27/2015  . Other chest pain 12/10/2014  . Medication management 08/22/2014  . Hematuria 08/22/2014  . Pain in joint, ankle and foot 05/23/2014  . Chronic anticoagulation 05/17/2014  . Chronic thoracic back pain 04/11/2014  . Right flank pain 01/17/2014  . Degenerative arthritis of left knee 10/06/2013  . Status post total knee replacement 10/06/2013  . Edema 07/28/2013  . Chronic pain 06/13/2013  . Lap Sleeve  Gastrectomy May 2014 09/16/2012  . 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 Bipolar disorder ([place]) 01/19/2012  . Nephrolithiasis 02/20/2011  . Eczematous dermatitis 10/22/2010  . Iron deficiency anemia 06/27/2010  . Anxiety 06/27/2010  . Migraine 09/12/2009  . Morbid obesity-BMI 62 10/30/2008  . Osteoarthrosis, unspecified whether generalized or localized, involving lower leg 07/05/2007  . Major depressive disorder, recurrent episode ([place]) 07/01/2006  . HYPERTENSION, BENIGN SYSTEMIC 07/01/2006  . RHINITIS, ALLERGIC 07/01/2006  . Gastroesophageal reflux  disease 07/01/2006   Past Medical History:  Diagnosis Date  . Anemia    iron def  . Anginal pain ([place])   . Anxiety   . Arthritis    left knee   . Back pain   . Benign gastrointestinal stromal tumor (GIST)   . Chronic knee pain   . Depression   . DVT (deep venous thrombosis) ([place])   . GERD (gastroesophageal reflux disease)   . History of kidney stones   . Hx of laparoscopic gastric banding   . Hypertension   . Migraine   . Morbid obesity ([place])   . PE (pulmonary embolism)    Family History  Problem Relation Age of Onset  . Diabetes Mother   . Diabetes Father   . Heart disease Father   . Colon cancer Father        brain tumor  . Colon polyps Father   . Colitis Father   . Irritable bowel syndrome Father   . Diabetes Maternal Grandmother   . Diabetes Maternal Grandfather   . Schizophrenia Maternal Grandfather   . Heart disease Paternal Uncle   . Diabetes Paternal Uncle   . Heart disease Paternal Grandmother   . Heart disease Paternal Grandfather   . Diabetes Maternal Aunt   . Diabetes Maternal Uncle   . Diabetes Paternal Aunt   . Cancer Other   . Hypertension Other   . Esophageal cancer Neg Hx    Past Surgical History:  Procedure Laterality Date  . [place] STUDY N/A 05/13/2016   Procedure: [place] STUDY;  Surgeon: Manus Gunning, MD;  Location: [facility];  Service:  Gastroenterology;  Laterality: N/A;  . ABDOMINAL HYSTERECTOMY    . BIOPSY  02/16/2012   Procedure: BIOPSY;  Surgeon: [name], MD;  Location: [facility];  Service: General;;  biopsy of mass x 2  . BREATH TEK H PYLORI  07/28/2011   Procedure: [place];  Surgeon: [name], MD;  Location: [facility];  Service: General;  Laterality: N/A;  to be done at 745  . CESAREAN SECTION  05/1991  . COLONOSCOPY WITH PROPOFOL N/A 03/03/2016   Procedure: COLONOSCOPY WITH PROPOFOL;  Surgeon: Manus Gunning, MD;  Location: [facility];  Service: Gastroenterology;  Laterality: N/A;  . ESOPHAGEAL MANOMETRY N/A 05/13/2016   Procedure: ESOPHAGEAL MANOMETRY (EM);  Surgeon: Manus Gunning, MD;  Location: [facility];  Service: Gastroenterology;  Laterality: N/A;  . ESOPHAGOGASTRODUODENOSCOPY N/A 08/29/2012   Procedure: ESOPHAGOGASTRODUODENOSCOPY (EGD);  Surgeon: [name], MD;  Location: [facility];  Service: General;  Laterality: N/A;  . ESOPHAGOGASTRODUODENOSCOPY N/A 09/04/2014   Procedure: ESOPHAGOGASTRODUODENOSCOPY (EGD);  Surgeon: Alphonsa Overall, MD;  Location: [facility];  Service: General;  Laterality: N/A;  . ESOPHAGOGASTRODUODENOSCOPY (EGD) WITH PROPOFOL N/A 03/03/2016   Procedure: ESOPHAGOGASTRODUODENOSCOPY (EGD) WITH PROPOFOL;  Surgeon: Manus Gunning, MD;  Location: [facility];  Service: Gastroenterology;  Laterality: N/A;  . EUS  03/03/2012   Procedure: UPPER ENDOSCOPIC ULTRASOUND (EUS) LINEAR;  Surgeon: Milus Banister, MD;  Location: [facility];  Service: Endoscopy;  Laterality: N/A;  . GASTRIC ROUX-EN-Y N/A 12/07/2017   Procedure: LAPAROSCOPIC ROUX-EN-Y GASTRIC BYPASS WITH LYSIS OF ADHESIONS AND UPPER ENDOSCOPY ERAS Pathway;  Surgeon: Johnathan Hausen, MD;  Location: [facility];  Service: General;  Laterality: N/A;  . gist    . KNEE ARTHROSCOPY    . LAPAROSCOPIC GASTRECTOMY  04/08/2012   Procedure: LAPAROSCOPIC GASTRECTOMY;  Surgeon: [name], MD;  Location: [facility];  Service: General;;  removal of GIST tumor of stomach  . LAPAROSCOPIC GASTRIC SLEEVE RESECTION N/A 08/29/2012   Procedure: LAPAROSCOPIC GASTRIC SLEEVE RESECTION;  Surgeon: [name], MD;  Location: [facility];  Service: General;  Laterality: N/A;  Sleeve Gastrectomy  . LEFT HEART CATH AND CORONARY ANGIOGRAPHY N/A 05/28/2017   Procedure: LEFT HEART CATH AND CORONARY ANGIOGRAPHY;  Surgeon: Leonie Man, MD;  Location: [facility];  Service: Cardiovascular;  Laterality: N/A;  . ROUX-EN-Y GASTRIC BYPASS    . TOTAL KNEE ARTHROPLASTY Left 10/06/2013   Procedure: LEFT TOTAL KNEE ARTHROPLASTY;  Surgeon: Mcarthur Rossetti, MD;  Location: [facility];  Service: Orthopedics;  Laterality: Left;  . TUBAL LIGATION     Social History   Occupational History  . Not on file  Tobacco Use  . Smoking status: Never Smoker  . Smokeless tobacco: Never Used  Substance and Sexual Activity  . Alcohol use: No    Alcohol/week: 0.0 standard drinks    Frequency: Never  . Drug use: No  . Sexual activity: Yes

## 2019-02-06 ENCOUNTER — Encounter: Payer: Medicare Other | Admitting: Physical Medicine and Rehabilitation

## 2019-02-16 ENCOUNTER — Other Ambulatory Visit: Payer: Self-pay | Admitting: Family Medicine

## 2019-02-22 ENCOUNTER — Encounter (HOSPITAL_BASED_OUTPATIENT_CLINIC_OR_DEPARTMENT_OTHER): Payer: Self-pay

## 2019-02-22 ENCOUNTER — Emergency Department (HOSPITAL_BASED_OUTPATIENT_CLINIC_OR_DEPARTMENT_OTHER): Payer: Medicare Other

## 2019-02-22 ENCOUNTER — Other Ambulatory Visit: Payer: Self-pay

## 2019-02-22 ENCOUNTER — Emergency Department (HOSPITAL_BASED_OUTPATIENT_CLINIC_OR_DEPARTMENT_OTHER)
Admission: EM | Admit: 2019-02-22 | Discharge: 2019-02-22 | Disposition: A | Discharge status: Home / Self Care | Payer: Medicare Other | Attending: Emergency Medicine | Admitting: Emergency Medicine

## 2019-02-22 DIAGNOSIS — R0789 Other chest pain: Secondary | ICD-10-CM | POA: Insufficient documentation

## 2019-02-22 DIAGNOSIS — R079 Chest pain, unspecified: Secondary | ICD-10-CM | POA: Diagnosis not present

## 2019-02-22 DIAGNOSIS — Z79899 Other long term (current) drug therapy: Secondary | ICD-10-CM | POA: Diagnosis not present

## 2019-02-22 DIAGNOSIS — Z96652 Presence of left artificial knee joint: Secondary | ICD-10-CM | POA: Diagnosis not present

## 2019-02-22 DIAGNOSIS — I10 Essential (primary) hypertension: Secondary | ICD-10-CM | POA: Insufficient documentation

## 2019-02-22 LAB — CBC
HCT: 45.4 % (ref 36.0–46.0)
Hemoglobin: 13.6 g/dL (ref 12.0–15.0)
MCH: 26.9 pg (ref 26.0–34.0)
MCHC: 30 g/dL (ref 30.0–36.0)
MCV: 89.7 fL (ref 80.0–100.0)
Platelets: 413 10*3/uL — ABNORMAL HIGH (ref 150–400)
RBC: 5.06 MIL/uL (ref 3.87–5.11)
RDW: 13.6 % (ref 11.5–15.5)
WBC: 10.9 10*3/uL — ABNORMAL HIGH (ref 4.0–10.5)
nRBC: 0 % (ref 0.0–0.2)

## 2019-02-22 LAB — TROPONIN I (HIGH SENSITIVITY): Troponin I (High Sensitivity): <2 ng/L (ref <18)

## 2019-02-22 MED ORDER — SODIUM CHLORIDE 0.9% FLUSH
3.0000 mL | Freq: Once | INTRAVENOUS | Status: DC
  Filled 2019-02-22: qty 3

## 2019-02-22 NOTE — ED Provider Notes (Signed)
[facility]ERGENCY DEPARTMENT Provider Note   CSN: [id number] Arrival date & time: 02/22/19  2149     History   Chief Complaint Chief Complaint  Patient presents with  . Chest Pain    HPI Crystal Garza is a 48 y.o. female.     HPI   48 year old female with chest pain.  Onset while at rest in her recliner yesterday.  Sharp pain in the center of her chest.  Has waxed and waned without any appreciable exacerbating relieving factors.  Initially was pretty constant yesterday evening it continued till she went to bed.  She did not wake up with it again in the morning though.  She then went to work with recurrence of the pain.  She cannot remember specifically what she was doing when it came back.  Since that is coming and going.  No acute respiratory complaints.  No fevers or chills.  No cough.    Past Medical History:  Diagnosis Date  . Anemia    iron def  . Anginal pain ([place])   . Anxiety   . Arthritis    left knee   . Back pain   . Benign gastrointestinal stromal tumor (GIST)   . Chronic knee pain   . Depression   . DVT (deep venous thrombosis) ([place])   . GERD (gastroesophageal reflux disease)   . History of kidney stones   . Hx of laparoscopic gastric banding   . Hypertension   . Migraine   . Morbid obesity ([place])   . PE (pulmonary embolism)     Patient Active Problem List   Diagnosis Date Noted  . Pain, dental 10/07/2018  . 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 History of pulmonary embolism 05/25/2017  . Angina, class III ([place]) 05/25/2017  . Symptoms of gastroesophageal reflux 11/09/2016  . Esophageal dysphagia   . Nausea without vomiting   . Family history of colon cancer   . Dizziness 11/08/2015  . 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 Chronic neck and back pain 02/27/2015  . Other chest pain 12/10/2014  . Medication management 08/22/2014  . Hematuria 08/22/2014  . Pain in joint, ankle and foot 05/23/2014  . Chronic  anticoagulation 05/17/2014  . Chronic thoracic back pain 04/11/2014  . Right flank pain 01/17/2014  . Degenerative arthritis of left knee 10/06/2013  . Status post total knee replacement 10/06/2013  . Edema 07/28/2013  . Chronic pain 06/13/2013  . 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 Bipolar disorder ([place]) 01/19/2012  . Nephrolithiasis 02/20/2011  . Eczematous dermatitis 10/22/2010  . Iron deficiency anemia 06/27/2010  . Anxiety 06/27/2010  . Migraine 09/12/2009  . Morbid obesity-BMI 62 10/30/2008  . Osteoarthrosis, unspecified whether generalized or localized, involving lower leg 07/05/2007  . Major depressive disorder, recurrent episode ([place]) 07/01/2006  . HYPERTENSION, BENIGN SYSTEMIC 07/01/2006  . RHINITIS, ALLERGIC 07/01/2006  . Gastroesophageal reflux disease 07/01/2006    Past Surgical History:  Procedure Laterality Date  . [place] STUDY N/A 05/13/2016   Procedure: [place] STUDY;  Surgeon: Manus Gunning, MD;  Location: [facility];  Service: Gastroenterology;  Laterality: N/A;  . ABDOMINAL HYSTERECTOMY    . BIOPSY  02/16/2012   Procedure: BIOPSY;  Surgeon: [name], MD;  Location: [facility];  Service: General;;  biopsy of mass x 2  . BREATH TEK H PYLORI  07/28/2011   Procedure:  BREATH TEK H PYLORI;  Surgeon: [name], MD;  Location: [facility];  Service: General;  Laterality: N/A;  to be done at 745  . CESAREAN SECTION  05/1991  . COLONOSCOPY WITH PROPOFOL N/A 03/03/2016   Procedure: COLONOSCOPY WITH PROPOFOL;  Surgeon: Manus Gunning, MD;  Location: [facility];  Service: Gastroenterology;  Laterality: N/A;  . ESOPHAGEAL MANOMETRY N/A 05/13/2016   Procedure: ESOPHAGEAL MANOMETRY (EM);  Surgeon: Manus Gunning, MD;  Location: [facility];  Service: Gastroenterology;  Laterality: N/A;  . ESOPHAGOGASTRODUODENOSCOPY N/A 08/29/2012   Procedure:  ESOPHAGOGASTRODUODENOSCOPY (EGD);  Surgeon: [name], MD;  Location: [facility];  Service: General;  Laterality: N/A;  . ESOPHAGOGASTRODUODENOSCOPY N/A 09/04/2014   Procedure: ESOPHAGOGASTRODUODENOSCOPY (EGD);  Surgeon: Alphonsa Overall, MD;  Location: [facility];  Service: General;  Laterality: N/A;  . ESOPHAGOGASTRODUODENOSCOPY (EGD) WITH PROPOFOL N/A 03/03/2016   Procedure: ESOPHAGOGASTRODUODENOSCOPY (EGD) WITH PROPOFOL;  Surgeon: Manus Gunning, MD;  Location: [facility];  Service: Gastroenterology;  Laterality: N/A;  . EUS  03/03/2012   Procedure: UPPER ENDOSCOPIC ULTRASOUND (EUS) LINEAR;  Surgeon: Milus Banister, MD;  Location: [facility];  Service: Endoscopy;  Laterality: N/A;  . GASTRIC ROUX-EN-Y N/A 12/07/2017   Procedure: LAPAROSCOPIC ROUX-EN-Y GASTRIC BYPASS WITH LYSIS OF ADHESIONS AND UPPER ENDOSCOPY ERAS Pathway;  Surgeon: Johnathan Hausen, MD;  Location: [facility];  Service: General;  Laterality: N/A;  . gist    . KNEE ARTHROSCOPY    . LAPAROSCOPIC GASTRECTOMY  04/08/2012   Procedure: LAPAROSCOPIC GASTRECTOMY;  Surgeon: [name], MD;  Location: [facility];  Service: General;;  removal of GIST tumor of stomach  . LAPAROSCOPIC GASTRIC SLEEVE RESECTION N/A 08/29/2012   Procedure: LAPAROSCOPIC GASTRIC SLEEVE RESECTION;  Surgeon: [name], MD;  Location: [facility];  Service: General;  Laterality: N/A;  Sleeve Gastrectomy  . LEFT HEART CATH AND CORONARY ANGIOGRAPHY N/A 05/28/2017   Procedure: LEFT HEART CATH AND CORONARY ANGIOGRAPHY;  Surgeon: Leonie Man, MD;  Location: [place] [facility];  Service: Cardiovascular;  Laterality: N/A;  . ROUX-EN-Y GASTRIC BYPASS    . TOTAL KNEE ARTHROPLASTY Left 10/06/2013   Procedure: LEFT TOTAL KNEE ARTHROPLASTY;  Surgeon: Mcarthur Rossetti, MD;  Location: [facility];  Service: Orthopedics;  Laterality: Left;  . TUBAL LIGATION       OB History   No obstetric history on file.      Home Medications    Prior to Admission  medications   Medication Sig Start Date End Date Taking? Authorizing Provider  ALPRAZolam Duanne Moron) 1 MG tablet TAKE (1) TABLET TWICE A DAY AS NEEDED. 11/02/18   Dickie La, MD  Cholecalciferol (VITAMIN D-3) 125 MCG (5000 UT) TABS Take 1 tablet by mouth daily. 12/20/18   Hilts, Legrand Como, MD  dexlansoprazole (DEXILANT) 60 MG capsule Take 1 capsule (60 mg total) by mouth daily. 09/15/18   Armbruster, Carlota Raspberry, MD  fluticasone (FLONASE) 50 MCG/ACT nasal spray Place 2 sprays into both nostrils daily. 09/07/18   Dickie La, MD  lamoTRIgine (LAMICTAL) 200 MG tablet Take 1 tablet (200 mg total) by mouth at bedtime. 11/02/18   Dickie La, MD  lisinopril (ZESTRIL) 10 MG tablet TAKE 1 TABLET EACH DAY. 02/17/19   Dickie La, MD  Menatetrenone (VITAMIN K2) 100 MCG TABS Take 1 tablet by mouth daily. 12/20/18   Hilts, Legrand Como, MD  ondansetron (ZOFRAN-ODT) 4 MG disintegrating tablet Take 1 tablet (4 mg total) by mouth 2 (two) times daily as needed for  nausea or vomiting. 11/02/18   Dickie La, MD  oxyCODONE-acetaminophen (PERCOCET) 10-325 MG tablet TAKE 1 TABLET EVERY 6 HOURS AS NEEDED FOR CHRONIC PAIN. 01/03/19   Dickie La, MD  oxyCODONE-acetaminophen (PERCOCET) 10-325 MG tablet TAKE 1 TABLET EVERY 6 HOURS AS NEEDED FOR CHRONIC PAIN. 01/03/19   Dickie La, MD  oxyCODONE-acetaminophen (PERCOCET) 10-325 MG tablet TAKE 1 TABLET EVERY 6 HOURS AS NEEDED FOR CHRONIC PAIN. 01/30/19   Dickie La, MD  predniSONE (DELTASONE) 10 MG tablet Take as directed for 12 days.  Daily dose 6,6,5,5,4,4,3,3,2,2,1,1. 12/20/18   Hilts, Legrand Como, MD  risperiDONE (RISPERDAL) 1 MG tablet Take 1 tablet (1 mg total) by mouth at bedtime. 08/05/16   Dickie La, MD  XARELTO 10 MG TABS tablet TAKE 1 TABLET ONCE DAILY. 11/22/18   Dickie La, MD    Family History Family History  Problem Relation Age of Onset  . Diabetes Mother   . Diabetes Father   . Heart disease Father   . Colon cancer Father        brain tumor  . Colon polyps Father   .  Colitis Father   . Irritable bowel syndrome Father   . Diabetes Maternal Grandmother   . Diabetes Maternal Grandfather   . Schizophrenia Maternal Grandfather   . Heart disease Paternal Uncle   . Diabetes Paternal Uncle   . Heart disease Paternal Grandmother   . Heart disease Paternal Grandfather   . Diabetes Maternal Aunt   . Diabetes Maternal Uncle   . Diabetes Paternal Aunt   . Cancer Other   . Hypertension Other   . Esophageal cancer Neg Hx     Social History Social History   Tobacco Use  . Smoking status: Never Smoker  . Smokeless tobacco: Never Used  Substance Use Topics  . Alcohol use: No    Alcohol/week: 0.0 standard drinks    Frequency: Never  . Drug use: No     Allergies   Codeine and Tuna [fish allergy]   Review of Systems Review of Systems  All systems reviewed and negative, other than as noted in HPI.  Physical Exam Updated Vital Signs BP 109/60 (BP Location: Right Arm)   Pulse 79   Temp 98.4 F (36.9 C) (Oral)   Resp 20   Ht 5\' 7"  (1.702 m)   Wt 128.4 kg   SpO2 100%   BMI 44.32 kg/m   Physical Exam Vitals signs and nursing note reviewed.  Constitutional:      General: She is not in acute distress.    Appearance: She is well-developed. She is obese.  HENT:     Head: Normocephalic and atraumatic.  Eyes:     General:        Right eye: No discharge.        Left eye: No discharge.     Conjunctiva/sclera: Conjunctivae normal.  Neck:     Musculoskeletal: Neck supple.  Cardiovascular:     Rate and Rhythm: Normal rate and regular rhythm.     Heart sounds: Normal heart sounds. No murmur. No friction rub. No gallop.   Pulmonary:     Effort: Pulmonary effort is normal. No respiratory distress.     Breath sounds: Normal breath sounds.  Abdominal:     General: There is no distension.     Palpations: Abdomen is soft.     Tenderness: There is no abdominal tenderness.  Musculoskeletal:        General: No  tenderness.  Skin:    General: Skin  is warm and dry.  Neurological:     Mental Status: She is alert.  Psychiatric:        Behavior: Behavior normal.        Thought Content: Thought content normal.    ED Treatments / Results  Labs (all labs ordered are listed, but only abnormal results are displayed) Labs Reviewed  CBC - Abnormal; Notable for the following components:      Result Value   WBC 10.9 (*)    Platelets 413 (*)    All other components within normal limits  TROPONIN I (HIGH SENSITIVITY)  TROPONIN I (HIGH SENSITIVITY)    EKG EKG Interpretation  Date/Time:  Wednesday February 22 2019 22:03:55 EDT Ventricular Rate:  84 PR Interval:    QRS Duration: 105 QT Interval:  367 QTC Calculation: 434 R Axis:   42 Text Interpretation:  Sinus rhythm Low voltage, precordial leads Confirmed by Virgel Manifold [phone]) on 02/22/2019 10:10:29 PM   Radiology Dg Chest 2 View  Result Date: 02/22/2019 CLINICAL DATA:  Chest pain EXAM: CHEST - 2 VIEW COMPARISON:  CT and radiograph 02/05/2018 FINDINGS: Hazy lung bases likely attributable to overlying soft tissue. No consolidation or effusion. Normal heart size. No pneumothorax. IMPRESSION: No active cardiopulmonary disease. Electronically Signed   By: Donavan Foil M.D.   On: 02/22/2019 22:50    Procedures Procedures (including critical care time)  Medications Ordered in ED Medications  sodium chloride flush (NS) 0.9 % injection 3 mL (3 mLs Intravenous Not Given 02/22/19 2231)     Initial Impression / Assessment and Plan / ED Course  I have reviewed the triage vital signs and the nursing notes.  Pertinent labs & imaging results that were available during my care of the patient were reviewed by me and considered in my medical decision making (see chart for details).        48 year old female with chest pain.  Seems atypical for ACS.  Doubt PE, dissection of the emergent process.  Cardiac catheterization less than 2 years ago with minimal CAD per her report.  She is  on Xarelto and reports compliance.  Pain is somewhat reproducible with palpation.  Chest x-ray without acute abnormality.  Labs unremarkable.  Final Clinical Impressions(s) / ED Diagnoses   Final diagnoses:  Chest pain, unspecified type    ED Discharge Orders    None       Virgel Manifold, MD 02/24/19 1001

## 2019-02-22 NOTE — ED Triage Notes (Signed)
Pt c/o CP started last night-denies fever/flu like sx-NAD-steady gait

## 2019-02-23 ENCOUNTER — Ambulatory Visit: Payer: Medicare Other

## 2019-02-24 ENCOUNTER — Other Ambulatory Visit: Payer: Self-pay

## 2019-02-24 ENCOUNTER — Ambulatory Visit (INDEPENDENT_AMBULATORY_CARE_PROVIDER_SITE_OTHER): Payer: Medicare Other | Admitting: Family Medicine

## 2019-02-24 VITALS — BP 127/37 | Ht 67.0 in | Wt 270.0 lb

## 2019-02-24 DIAGNOSIS — M792 Neuralgia and neuritis, unspecified: Secondary | ICD-10-CM | POA: Diagnosis not present

## 2019-02-24 DIAGNOSIS — R0789 Other chest pain: Secondary | ICD-10-CM

## 2019-02-24 MED ORDER — OXYCODONE-ACETAMINOPHEN 10-325 MG PO TABS: ORAL_TABLET | ORAL | 0 refills | Status: DC

## 2019-02-24 NOTE — Patient Instructions (Signed)
Given your chest and left upper extremity pain with occasional arm numbness, I think we need to look at your neck with an MRI. If you have not heard from my office within 2 working days after the MRI is complete, please call my office. Please let me know if you have any new or worsening symptoms

## 2019-02-25 NOTE — Progress Notes (Signed)
Crystal Garza - 48 y.o. female MRN [id number]  Date of birth: Feb 17, 1971    SUBJECTIVE:      Chief Complaint:/ HPI:  Chest pain in left upper extremity numbness and tingling.  Had acute episode of chest pain and was seen at the emergency department where she had negative work-up for acute coronary syndrome.  Chest pain has improved but she still having it intermittently is extremely sharp in the left to her chest, 8 out of 10.  Lasts for several minutes usually.  Sometimes is associated with left upper extremity numbness and tingling.  Once or twice her left extremity has felt "dead".  With each of these episodes she is not had nausea but at one point she had sweats and lightheadedness and this was what prompted her to go to the emergency department.  Right-hand-dominant.   ROS:     See HPI Additionally no unusual weight change, bo heartburn symtoms, no abdominal pain.  PERTINENT  PMH / PSH FH / / SH:  Past Medical, Surgical, Social, and Family History Reviewed & Updated in the EMR.  Pertinent findings include:   Cardiac catheterization: Angiographically normal coronary arteries with only minimal CAD.  The left ventricular systolic function is normal. The left ventricular ejection fraction is 55-65% by visual estimate. LV end diastolic pressure is moderately elevated. 20 mmHg  January 2019:  History of gastric cancer GIST tumor) la st EGD 2017: 3cm hiatal hernia. - LA Grade A reflux esophagitis with suspected inflammatory related nodularity. Biopsied. - A sleeve gastrectomy was found. - Normal duodenal bulb and second portion of the duodenum  History of gastric sleeve.:IMPRESSION: 1. Mild mucosal irregularity in distal esophagus suggests esophagitis. Favor reflux esophagitis. 2. No high-grade obstruction or stricture. The barium tablet passed GE junction easily. 3. Some to and fro motion distal esophagus consistent with mild esophageal dysmotility. 4. Mild gastroesophageal reflux.   Esophagram 09/2018:    OBJECTIVE: BP (!) 127/37   Ht 5\' 7"  (1.702 m)   Wt 270 lb (122.5 kg)   BMI 42.29 kg/m   Physical Exam:  Vital signs are reviewed. GENERAL: Well-developed overweight female no acute distress NECK: Full range of motion.  Negative Spurling's.  Painless and full range of motion in lateral bend and lateral rotation. CHEST: Nontender to palpation.  Macromastia is noted. back: Upper back is nontender to palpation.  There is no defect of the thoracic vertebra noted.  She does have a mildly tender to palpation area in the rhomboids bilaterally. NEURO: Intact strength 456 and 7 upper extremity. neuro: DTRs elbow forearm 2+ bilateral symmetrical.  Grip strength is normal.  Sensation to soft touch is intact bilaterally upper extremities.  ASSESSMENT & PLAN:  #1.  Atypical chest pain, does not seem cardiac in etiology.  Since she had  Also had symptoms of left upper extremity paresthesias and radiculopathy I am concerned that this is originating in the cervical spine.  She may be having some radiation to the anterior chest, specifically the pectoralis muscle.  I reviewed her thorough cardiac work-up done in 2017.  We discussed at length spending greater than 50% of our 25-minute office visit in counseling and education regarding these issues.  Ultimately we decided to get an MRI of the neck and then proceed based on those findings.  She is given red flags for new or worsening symptoms and will call immediately.  I will follow up after MRI.  I have reviewed her records regarding her GIST tumor.  If MRI  is negative, may consider referral back to GI for follow-up of EGD etc.  This does not seem to be GI in origin but her significant past history is noted.

## 2019-03-06 ENCOUNTER — Encounter: Payer: Self-pay | Admitting: Physical Medicine and Rehabilitation

## 2019-03-06 ENCOUNTER — Ambulatory Visit: Payer: Self-pay

## 2019-03-06 ENCOUNTER — Ambulatory Visit (INDEPENDENT_AMBULATORY_CARE_PROVIDER_SITE_OTHER): Payer: Medicare Other | Admitting: Physical Medicine and Rehabilitation

## 2019-03-06 VITALS — BP 114/70 | HR 86

## 2019-03-06 DIAGNOSIS — M4316 Spondylolisthesis, lumbar region: Secondary | ICD-10-CM

## 2019-03-06 DIAGNOSIS — G894 Chronic pain syndrome: Secondary | ICD-10-CM

## 2019-03-06 DIAGNOSIS — M5416 Radiculopathy, lumbar region: Secondary | ICD-10-CM

## 2019-03-06 MED ORDER — BETAMETHASONE SOD PHOS & ACET 6 (3-3) MG/ML IJ SUSP: 12.0000 mg | Freq: Once | INTRAMUSCULAR | Status: DC

## 2019-03-06 NOTE — Progress Notes (Signed)
..  Numeric Pain Rating Scale and Functional Assessment Average Pain 7   In the last MONTH (on 0-10 scale) has pain interfered with the following?  1. General activity like being  able to carry out your everyday physical activities such as walking, climbing stairs, carrying groceries, or moving a chair?  Rating(7)   +Driver, +BT(xarelto, ok for inj), -Dye Allergies.

## 2019-03-08 ENCOUNTER — Other Ambulatory Visit: Payer: Self-pay

## 2019-03-08 ENCOUNTER — Ambulatory Visit
Admission: RE | Admit: 2019-03-08 | Discharge: 2019-03-08 | Disposition: A | Discharge status: Home / Self Care | Payer: Medicare Other | Source: Ambulatory Visit | Attending: Family Medicine | Admitting: Family Medicine

## 2019-03-08 DIAGNOSIS — M4802 Spinal stenosis, cervical region: Secondary | ICD-10-CM | POA: Diagnosis not present

## 2019-03-08 DIAGNOSIS — M792 Neuralgia and neuritis, unspecified: Secondary | ICD-10-CM

## 2019-03-09 ENCOUNTER — Other Ambulatory Visit: Payer: Medicare Other

## 2019-03-17 ENCOUNTER — Telehealth: Payer: Self-pay | Admitting: Physical Medicine and Rehabilitation

## 2019-03-18 ENCOUNTER — Encounter: Payer: Self-pay | Admitting: Physical Medicine and Rehabilitation

## 2019-03-20 NOTE — Telephone Encounter (Signed)
I did look at the MRI of her cervical spine does show small disc protrusion and some foraminal narrowing on the right but I think she is hurting more on the left.  I be happy to look at the MRI with her and talk about options.  She does not have to see a neurosurgeon right away although she could see them from an information and consultation standpoint and does not have to agree to any surgery.  If she wants recommendations for a neurosurgeon we can give her those.

## 2019-03-20 NOTE — Telephone Encounter (Signed)
Pt lvm and states that she has had an MRI 03/08/2019, pt states her PCP (Dr. Nori Riis)  is suggesting she sees a Publishing rights manager. Pt would like for you to go over her MRI since you have seen her for her lower back she feels more comfortable discussing with you about her results and what you would suggest.

## 2019-03-20 NOTE — Telephone Encounter (Signed)
Pt is scheduled for an OV 04/11/2019.

## 2019-03-29 ENCOUNTER — Other Ambulatory Visit: Payer: Self-pay

## 2019-03-29 ENCOUNTER — Ambulatory Visit (INDEPENDENT_AMBULATORY_CARE_PROVIDER_SITE_OTHER): Payer: Medicare Other | Admitting: Family Medicine

## 2019-03-29 ENCOUNTER — Encounter: Payer: Self-pay | Admitting: Family Medicine

## 2019-03-29 VITALS — BP 112/64 | HR 71 | Wt 284.8 lb

## 2019-03-29 DIAGNOSIS — G894 Chronic pain syndrome: Secondary | ICD-10-CM

## 2019-03-29 DIAGNOSIS — M5412 Radiculopathy, cervical region: Secondary | ICD-10-CM

## 2019-03-29 MED ORDER — AMITRIPTYLINE HCL 10 MG PO TABS: 10.0000 mg | ORAL_TABLET | Freq: Every day | ORAL | 1 refills | Status: DC

## 2019-03-29 MED ORDER — OXYCODONE-ACETAMINOPHEN 10-325 MG PO TABS: ORAL_TABLET | ORAL | 0 refills | Status: DC

## 2019-03-29 NOTE — Assessment & Plan Note (Signed)
Reviewed her MRI images and findings with her.  I think this does explain her numbness in her very slight tricep weakness on the left.  I doubt this needs anything surgical done at this moment but would like her to either follow-up with her PMR doctor or neurosurgeon.  She has an appointment with PMR this next month so we will discuss with him.  We discussed red flags such as increasing weakness etc.

## 2019-03-29 NOTE — Assessment & Plan Note (Signed)
Continue current pain medicine regimen.  Will add very low-dose amitriptyline at night and she will let me know in 3 weeks or so whether or not this is improving.  Otherwise follow this up in 2 months.

## 2019-03-29 NOTE — Progress Notes (Signed)
    CHIEF COMPLAINT / HPI: #1.  Follow-up recent MRI of the neck.  Wants to discuss findings. 2.  Chronic pain syndrome: She has noticed more diffuse aches and pains.  These are keeping her awake at night.  She does not want to increase her pain medicine regimen but would like to find a way to sleep better. 3.  Had a lumbar steroid shot under fluoroscope which helped the right sided radicular pain she was having when I saw her at last visit.  She continues to have low back pain but the radicular symptoms have resolved.  REVIEW OF SYSTEMS: No fever, no cough.  PERTINENT  PMH / PSH: I have reviewed the patient's medications, allergies, past medical and surgical history, smoking status and updated in the EMR as appropriate.   OBJECTIVE:  Vital signs reviewed. GENERAL: Well-developed, well-nourished, no acute distress. CARDIOVASCULAR: Regular rate and rhythm no murmur gallop or rub LUNGS: Clear to auscultation bilaterally, no rales or wheeze. ABDOMEN: Soft positive bowel sounds NEURO: No gross focal neurological deficits. MSK: Movement of extremity x 4.  Strength bilateral upper extremity symmetrical except for 4-5 out of 5 on left tricep compared with 5 out of 5 on right tricep.    ASSESSMENT / PLAN:   No problem-specific Assessment & Plan notes found for this encounter.

## 2019-04-11 ENCOUNTER — Ambulatory Visit: Payer: Medicare Other | Admitting: Physical Medicine and Rehabilitation

## 2019-04-17 ENCOUNTER — Telehealth: Payer: Self-pay | Admitting: Physical Medicine and Rehabilitation

## 2019-04-17 NOTE — Telephone Encounter (Signed)
Ok, but didn't we have her recently for OV or something to review Cervical? I'm doing both at same time unless we know that is the plan.

## 2019-04-18 NOTE — Telephone Encounter (Signed)
Left message #1

## 2019-04-18 NOTE — Telephone Encounter (Signed)
Patient states that pain is still in lower back, but now she has left leg pain. She is scheduled for an ov on 12/23 to review cervical MRI and would like an eval for her left leg pain at the same time. I advised that no injection would be done during this visit.

## 2019-04-18 NOTE — Telephone Encounter (Signed)
Ok sounds good

## 2019-04-19 ENCOUNTER — Encounter: Payer: Self-pay | Admitting: Family Medicine

## 2019-04-20 ENCOUNTER — Other Ambulatory Visit: Payer: Self-pay | Admitting: Family Medicine

## 2019-04-25 ENCOUNTER — Other Ambulatory Visit: Payer: Self-pay | Admitting: Family Medicine

## 2019-04-25 MED ORDER — OXYCODONE-ACETAMINOPHEN 10-325 MG PO TABS: ORAL_TABLET | ORAL | 0 refills | Status: DC

## 2019-04-26 ENCOUNTER — Encounter: Payer: Self-pay | Admitting: Physical Medicine and Rehabilitation

## 2019-04-26 ENCOUNTER — Ambulatory Visit (INDEPENDENT_AMBULATORY_CARE_PROVIDER_SITE_OTHER): Payer: Medicare Other | Admitting: Physical Medicine and Rehabilitation

## 2019-04-26 ENCOUNTER — Other Ambulatory Visit: Payer: Self-pay

## 2019-04-26 VITALS — BP 134/79 | HR 71 | Ht 67.0 in | Wt 280.0 lb

## 2019-04-26 DIAGNOSIS — M25512 Pain in left shoulder: Secondary | ICD-10-CM

## 2019-04-26 DIAGNOSIS — M5412 Radiculopathy, cervical region: Secondary | ICD-10-CM

## 2019-04-26 DIAGNOSIS — M47816 Spondylosis without myelopathy or radiculopathy, lumbar region: Secondary | ICD-10-CM

## 2019-04-26 DIAGNOSIS — M5010 Cervical disc disorder with radiculopathy, unspecified cervical region: Secondary | ICD-10-CM | POA: Diagnosis not present

## 2019-04-26 DIAGNOSIS — M7918 Myalgia, other site: Secondary | ICD-10-CM | POA: Diagnosis not present

## 2019-04-26 DIAGNOSIS — G8929 Other chronic pain: Secondary | ICD-10-CM

## 2019-04-26 MED ORDER — TRIAMCINOLONE ACETONIDE 40 MG/ML IJ SUSP
40.0000 mg | INTRAMUSCULAR | Status: AC | PRN
  Administered 2019-04-26: 40 mg via INTRA_ARTICULAR

## 2019-04-26 MED ORDER — BUPIVACAINE HCL 0.25 % IJ SOLN
4.0000 mL | INTRAMUSCULAR | Status: AC | PRN
  Administered 2019-04-26: 13:00:00 4 mL via INTRA_ARTICULAR

## 2019-04-26 NOTE — Progress Notes (Signed)
 .  Numeric Pain Rating Scale and Functional Assessment Average Pain 10 Pain Right Now 8 My pain is constant, sharp, dull and aching Pain is worse with: some activites Pain improves with: bengay   In the last MONTH (on 0-10 scale) has pain interfered with the following?  1. General activity like being  able to carry out your everyday physical activities such as walking, climbing stairs, carrying groceries, or moving a chair?  Rating(8)  2. Relation with others like being able to carry out your usual social activities and roles such as  activities at home, at work and in your community. Rating(9)  3. Enjoyment of life such that you have  been bothered by emotional problems such as feeling anxious, depressed or irritable?  Rating(7)

## 2019-04-26 NOTE — Progress Notes (Signed)
Crystal Garza - 48 y.o. female MRN [id number]  Date of birth: [birth year]  Office Visit Note: Visit Date: 04/26/2019 PCP: Dickie La, MD Referred by: Dickie La, MD  Subjective: Chief Complaint  Patient presents with  . Neck - Pain  . Left Shoulder - Pain  . Left Arm - Pain   HPI: Crystal Garza is a 48 y.o. female who comes in today For evaluation of neck pain and left shoulder and upper back pain as well as left anterior chest pain and radicular pain in the arm.  Also with chronic worsening severe low back pain.  Patient is well-known to me from her chronic low back pain.  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  MRI from 2016.  Failure of conservative care with therapy as well as medication management.  Case complicated from a lower back standpoint with morbid obesity.  Patient has tried to lose some weight but is still at 280 pounds.  She continues to work on this and is actually made some headway.  She gets some referral pattern from the back into the right thigh and knee but nothing past the knee.  Injection of the lumbar spine at the facets have given her even relief of her back pain and this thigh pain.  She really has not been a candidate for radiofrequency ablation has done well with intermittent facet joint blocks.  Her back pain symptoms today are very similar worse with standing going from sit to stand again no radicular complaints no paresthesias.  Her newer complaint has been this neck and chest pain and shoulder pain and arm pain with hand numbness and tingling.  She recently spent time at the emergency department with chest pain she has been looked at from a cardiovascular standpoint this has been ruled out.  She does have a history of angina and hypertension.  Because of the newer complaint of arm pain MRI of the cervical spine was performed and this is reviewed with the patient today.  She has no significant central stenosis but at C5-6  there is a central disc osteophyte with protrusion that does abut the cord in the midline.  She has a couple of spells a couple of spots with this central is a couple of spots with a central disc osteophyte almost on a pointed position in the upper cervical spine but without any contact.  She has no foraminal narrowing she has some mild facet arthritis in the neck arthritis in the neck.  Arthritis in the neck.  She also reports pain in the left and right shoulder with movement.  She has been followed in the past by Dr. Ninfa Linden from an orthopedic standpoint with her knees and shoulders.  She also has pain in the left arm down to the hand.  Sometimes she feels like he can go from the hand up to the shoulder.  We completed electrodiagnostic study of the left hand in 2015 I believe that showed moderate median nerve neuropathy at the wrist consistent with carpal tunnel syndrome.  She has had a right carpal tunnel tunnel release in the past by Dr. Fredna Dow I believe at the hand center.  She has not had any work done on the left hand.  Left hand numbness is somewhat global.  Worse with positioning and at night.  Review of Systems  Constitutional: Negative for chills, fever, malaise/fatigue and weight loss.  HENT: Negative for hearing loss and  sinus pain.   Eyes: Negative for blurred vision, double vision and photophobia.  Respiratory: Negative for cough and shortness of breath.   Cardiovascular: Negative for chest pain, palpitations and leg swelling.  Gastrointestinal: Negative for abdominal pain, nausea and vomiting.  Genitourinary: Negative for flank pain.  Musculoskeletal: Positive for back pain, joint pain and neck pain. Negative for myalgias.  Skin: Negative for itching and rash.  Neurological: Positive for tingling. Negative for tremors, focal weakness and weakness.  Endo/Heme/Allergies: Negative.   Psychiatric/Behavioral: Negative for depression.  All other systems reviewed and are negative.  Otherwise  per HPI.  Assessment & Plan: Visit Diagnoses:  1. Cervical disc disorder with radiculopathy   2. Cervical radiculopathy   3. Chronic left shoulder pain   4. Myofascial pain syndrome   5. Spondylosis without myelopathy or radiculopathy, lumbar region     Plan: Findings:  1.  Neck pain with referral patterns in the chest and shoulder and upper back as well as arm and hand with global nondermatomal paresthesia.  This is clearly multifactorial.  She has pain with rotation of the shoulder which seems to be subacromial bursitis or potentially rotator cuff tendinitis.  She also has focal trigger points in the trapezius and levator scapula rhomboid.  She clearly has a history of carpal tunnel syndrome and median neuropathy on nerve study of the left which was moderate 5 years ago and is likely worsened.  Today we completed subacromial injection diagnostically and she did not really get that much relief during the anesthetic phase.  I want to send her to physical therapy for dry needling and manual therapy as well as posture management and strengthening.  We did make a referral for that today.  Lastly I do want to try cervical epidural injection diagnostically and therapeutically mainly for this shoulder and left chest wall pain seems to be occurring.  Depending on relief would have her follow-up with Dr. Jean Rosenthal for looking at her carpal tunnel syndrome in her shoulders.  In terms of her low back pain is continued chronic facet arthropathy.  Exacerbated by her weight.  We will set her up for diagnostic and therapeutic facet joint blocks.  Consider radiofrequency ablation approach which would be difficult given her body habitus but we may be able to try that if we can get it approved.  She will continue with home exercise in this regard.    Meds & Orders: No orders of the defined types were placed in this encounter.   Orders Placed This Encounter  Procedures  . Large Joint Inj  . Ambulatory  referral to Physical Therapy    Follow-up: Return for Cervical epidural followed by lumbar facet joint.   Procedures: Large Joint Inj: L subacromial bursa on 04/26/2019 12:32 PM Indications: pain and diagnostic evaluation Details: 25 G 1.5 in needle, posterior approach  Arthrogram: No  Medications: 40 mg triamcinolone acetonide 40 MG/ML; 4 mL bupivacaine 0.25 % Outcome: tolerated well, no immediate complications  Injectate delivered freely without restriction through the normal position. Patient seemed to have relief during the anesthetic phase.  Procedure, treatment alternatives, risks and benefits explained, specific risks discussed. Consent was given by the patient. Immediately prior to procedure a time out was called to verify the correct patient, procedure, equipment, support staff and site/side marked as required. Patient was prepped and draped in the usual sterile fashion.      No notes on file   Clinical History: MRI CERVICAL SPINE WITHOUT CONTRAST  TECHNIQUE: Multiplanar,  multisequence MR imaging of the cervical spine was performed. No intravenous contrast was administered.  COMPARISON:  April 01, 2010  FINDINGS: Alignment: There is straightening of the normal cervical lordosis.  Vertebrae: The vertebral body heights are well maintained. No fracture, marrow edema,or pathologic marrow infiltration.  Cord: Normal signal and morphology.  Posterior Fossa, vertebral arteries, paraspinal tissues:  The visualized portion of the posterior fossa is unremarkable. Normal flow voids seen within the vertebral arteries. The paraspinal soft tissues are unremarkable. Again noted is an expanded empty sella.  Disc levels:  C1-C2: Atlanto-axial junction is normal, without canal narrowing  C2-C3: There is a minimal central disc protrusion, however no significant canal or neural foraminal narrowing is seen.  C3-C4: There is a minimal disc osteophyte with a tiny  central disc protrusion, however no significant canal or neural foraminal narrowing.  C4-C5: There is a minimal disc osteophyte complex uncovertebral osteophytes which severe right and mild left neural foraminal narrowing. There is mild effacement anterior thecal sac.  C5-C6: There is a disc osteophyte complex with a central disc protrusion which causes effacement of the anterior thecal sac. There is mild bilateral neural foraminal narrowing.  C6-C7: A minimal disc osteophyte complex, however no significant canal or neural foraminal narrowing.  C7-T1: No significant spinal canal or neural foraminal narrowing  IMPRESSION: Cervical spine spondylosis most notable at C4-C5 with severe right and mild left neural foraminal narrowing. Also at C5-C6 with a new central disc protrusion which causes mild central canal stenosis.   Electronically Signed   By: Prudencio Pair M.D.   On: 03/08/2019 16:59 === MRI LUMBAR SPINE WITHOUT CONTRAST  TECHNIQUE: Multiplanar, multisequence MR imaging of the lumbar spine was performed. No intravenous contrast was administered.  COMPARISON: CT 01/16/2014  FINDINGS: There is no abnormality at L3-4 or above. The discs are normal. The canal and foramina are widely patent. The distal cord and conus are normal with conus tip at L1.  L4-5: There is bilateral facet degeneration with facet and ligamentous hypertrophy. There is anterolisthesis of 2 mm. The disc bulges diffusely. No apparent compressive stenosis. There is mild discogenic endplate edema which could be associated with back pain.  L5-S1: No disc pathology. Mild facet degeneration and hypertrophy. No stenosis.  IMPRESSION: Findings at L4-5 that could relate to back pain. Bilateral facet degeneration with 2 mm of anterolisthesis. Degeneration and bulging of the disc. Discogenic endplate edema. No apparent stenosis or neural compression.   Electronically Signed By: Nelson Chimes M.D. On: 05/06/2014 09:32   She reports that she has never smoked. She has never used smokeless tobacco. No results for input(s): HGBA1C, LABURIC in the last 8760 hours.  Objective:  VS:  HT:5\' 7"  (170.2 cm)   WT:280 lb (127 kg)  BMI:43.84    BP:134/79  HR:71bpm  TEMP: ( )  RESP:  Physical Exam Vitals and nursing note reviewed.  Constitutional:      General: She is not in acute distress.    Appearance: Normal appearance. She is well-developed. She is obese.  HENT:     Head: Normocephalic and atraumatic.     Nose: Nose normal.     Mouth/Throat:     Mouth: Mucous membranes are moist.     Pharynx: Oropharynx is clear.  Eyes:     Conjunctiva/sclera: Conjunctivae normal.     Pupils: Pupils are equal, round, and reactive to light.  Cardiovascular:     Rate and Rhythm: Regular rhythm.  Pulmonary:     Effort: Pulmonary  effort is normal. No respiratory distress.  Abdominal:     General: There is no distension.     Palpations: Abdomen is soft.     Tenderness: There is no guarding.  Musculoskeletal:     Cervical back: Normal range of motion and neck supple. No rigidity.     Right lower leg: No edema.     Left lower leg: No edema.     Comments: Examination of cervical spine shows forward flexed cervical spine with trigger points evident in the left more than right trapezius and levator scapula and rhomboid.  These do reproduce some of her pain.  No trigger points noted in the pectoralis area.  She does have impingement at end ranges of the shoulder left and right.  She has good strength in both upper extremities bilaterally.  She has a positive Phalen's on the left.  She has well-healed surgical scar on the right for carpal tunnel release.  Examination of the lower back shows pain with extension and facet loading she has good distal strength.  She has difficulty going from sit to stand.  Lymphadenopathy:     Cervical: No cervical adenopathy.  Skin:    General: Skin is warm and  dry.     Findings: No erythema or rash.  Neurological:     General: No focal deficit present.     Mental Status: She is alert and oriented to person, place, and time.     Motor: No abnormal muscle tone.     Coordination: Coordination normal.     Gait: Gait normal.  Psychiatric:        Mood and Affect: Mood normal.        Behavior: Behavior normal.        Thought Content: Thought content normal.     Ortho Exam Imaging: No results found.  Past Medical/Family/Surgical/Social History: Medications & Allergies reviewed per EMR, new medications updated. Patient Active Problem List   Diagnosis Date Noted  . Cervical radiculopathy at C6 03/29/2019  . Pain, dental 10/07/2018  . 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 History of pulmonary embolism 05/25/2017  . Angina, class III ([place]) 05/25/2017  . Symptoms of gastroesophageal reflux 11/09/2016  . Esophageal dysphagia   . Nausea without vomiting   . Family history of colon cancer   . Dizziness 11/08/2015  . 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 Chronic neck and back pain 02/27/2015  . Other chest pain 12/10/2014  . Medication management 08/22/2014  . Hematuria 08/22/2014  . Pain in joint, ankle and foot 05/23/2014  . Chronic anticoagulation 05/17/2014  . Chronic thoracic back pain 04/11/2014  . Right flank pain 01/17/2014  . Degenerative arthritis of left knee 10/06/2013  . Status post total knee replacement 10/06/2013  . Edema 07/28/2013  . Chronic pain 06/13/2013  . 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 Bipolar disorder ([place]) 01/19/2012  . Nephrolithiasis 02/20/2011  . Eczematous dermatitis 10/22/2010  . Iron deficiency anemia 06/27/2010  . Anxiety 06/27/2010  . Migraine 09/12/2009  . Morbid obesity-BMI 62 10/30/2008  . Osteoarthrosis, unspecified whether generalized or localized, involving lower leg 07/05/2007  . Major depressive  disorder, recurrent episode ([place]) 07/01/2006  . HYPERTENSION, BENIGN SYSTEMIC 07/01/2006  . RHINITIS, ALLERGIC 07/01/2006  . Gastroesophageal reflux disease 07/01/2006   Past Medical History:  Diagnosis Date  . Anemia    iron def  . Anginal pain ([place])   .  Anxiety   . Arthritis    left knee   . Back pain   . Benign gastrointestinal stromal tumor (GIST)   . Chronic knee pain   . Depression   . DVT (deep venous thrombosis) ([place])   . GERD (gastroesophageal reflux disease)   . History of kidney stones   . Hx of laparoscopic gastric banding   . Hypertension   . Migraine   . Morbid obesity ([place])   . PE (pulmonary embolism)    Family History  Problem Relation Age of Onset  . Diabetes Mother   . Diabetes Father   . Heart disease Father   . Colon cancer Father        brain tumor  . Colon polyps Father   . Colitis Father   . Irritable bowel syndrome Father   . Diabetes Maternal Grandmother   . Diabetes Maternal Grandfather   . Schizophrenia Maternal Grandfather   . Heart disease Paternal Uncle   . Diabetes Paternal Uncle   . Heart disease Paternal Grandmother   . Heart disease Paternal Grandfather   . Diabetes Maternal Aunt   . Diabetes Maternal Uncle   . Diabetes Paternal Aunt   . Cancer Other   . Hypertension Other   . Esophageal cancer Neg Hx    Past Surgical History:  Procedure Laterality Date  . [place] STUDY N/A 05/13/2016   Procedure: [place] STUDY;  Surgeon: Manus Gunning, MD;  Location: [facility];  Service: Gastroenterology;  Laterality: N/A;  . ABDOMINAL HYSTERECTOMY    . BIOPSY  02/16/2012   Procedure: BIOPSY;  Surgeon: [name], MD;  Location: [facility];  Service: General;;  biopsy of mass x 2  . BREATH TEK H PYLORI  07/28/2011   Procedure: [place];  Surgeon: [name], MD;  Location: [facility];  Service: General;  Laterality: N/A;  to be done at 745  . CESAREAN SECTION  05/1991  . COLONOSCOPY WITH PROPOFOL N/A  03/03/2016   Procedure: COLONOSCOPY WITH PROPOFOL;  Surgeon: Manus Gunning, MD;  Location: [facility];  Service: Gastroenterology;  Laterality: N/A;  . ESOPHAGEAL MANOMETRY N/A 05/13/2016   Procedure: ESOPHAGEAL MANOMETRY (EM);  Surgeon: Manus Gunning, MD;  Location: [facility];  Service: Gastroenterology;  Laterality: N/A;  . ESOPHAGOGASTRODUODENOSCOPY N/A 08/29/2012   Procedure: ESOPHAGOGASTRODUODENOSCOPY (EGD);  Surgeon: [name], MD;  Location: [facility];  Service: General;  Laterality: N/A;  . ESOPHAGOGASTRODUODENOSCOPY N/A 09/04/2014   Procedure: ESOPHAGOGASTRODUODENOSCOPY (EGD);  Surgeon: Alphonsa Overall, MD;  Location: [facility];  Service: General;  Laterality: N/A;  . ESOPHAGOGASTRODUODENOSCOPY (EGD) WITH PROPOFOL N/A 03/03/2016   Procedure: ESOPHAGOGASTRODUODENOSCOPY (EGD) WITH PROPOFOL;  Surgeon: Manus Gunning, MD;  Location: [facility];  Service: Gastroenterology;  Laterality: N/A;  . EUS  03/03/2012   Procedure: UPPER ENDOSCOPIC ULTRASOUND (EUS) LINEAR;  Surgeon: Milus Banister, MD;  Location: [facility];  Service: Endoscopy;  Laterality: N/A;  . GASTRIC ROUX-EN-Y N/A 12/07/2017   Procedure: LAPAROSCOPIC ROUX-EN-Y GASTRIC BYPASS WITH LYSIS OF ADHESIONS AND UPPER ENDOSCOPY ERAS Pathway;  Surgeon: Johnathan Hausen, MD;  Location: [facility];  Service: General;  Laterality: N/A;  . gist    . KNEE ARTHROSCOPY    . LAPAROSCOPIC GASTRECTOMY  04/08/2012   Procedure: LAPAROSCOPIC GASTRECTOMY;  Surgeon: [name], MD;  Location: [facility];  Service: General;;  removal of GIST tumor of stomach  . LAPAROSCOPIC GASTRIC SLEEVE RESECTION N/A 08/29/2012   Procedure: LAPAROSCOPIC GASTRIC  SLEEVE RESECTION;  Surgeon: [name], MD;  Location: [facility];  Service: General;  Laterality: N/A;  Sleeve Gastrectomy  . LEFT HEART CATH AND CORONARY ANGIOGRAPHY N/A 05/28/2017   Procedure: LEFT HEART CATH AND CORONARY ANGIOGRAPHY;  Surgeon: Leonie Man, MD;  Location: [facility];  Service: Cardiovascular;  Laterality: N/A;  . ROUX-EN-Y GASTRIC BYPASS    . TOTAL KNEE ARTHROPLASTY Left 10/06/2013   Procedure: LEFT TOTAL KNEE ARTHROPLASTY;  Surgeon: Mcarthur Rossetti, MD;  Location: [facility];  Service: Orthopedics;  Laterality: Left;  . TUBAL LIGATION     Social History   Occupational History  . Not on file  Tobacco Use  . Smoking status: Never Smoker  . Smokeless tobacco: Never Used  Substance and Sexual Activity  . Alcohol use: No    Alcohol/week: 0.0 standard drinks  . Drug use: No  . Sexual activity: Not on file

## 2019-05-06 ENCOUNTER — Other Ambulatory Visit: Payer: Self-pay | Admitting: Family Medicine

## 2019-05-09 ENCOUNTER — Ambulatory Visit (INDEPENDENT_AMBULATORY_CARE_PROVIDER_SITE_OTHER): Payer: Medicare Other | Admitting: Physical Medicine and Rehabilitation

## 2019-05-09 ENCOUNTER — Encounter: Payer: Self-pay | Admitting: Physical Medicine and Rehabilitation

## 2019-05-09 ENCOUNTER — Telehealth: Payer: Self-pay | Admitting: Physical Medicine and Rehabilitation

## 2019-05-09 ENCOUNTER — Other Ambulatory Visit: Payer: Self-pay

## 2019-05-09 ENCOUNTER — Ambulatory Visit: Payer: Self-pay

## 2019-05-09 VITALS — BP 121/74 | HR 68

## 2019-05-09 DIAGNOSIS — M47816 Spondylosis without myelopathy or radiculopathy, lumbar region: Secondary | ICD-10-CM | POA: Diagnosis not present

## 2019-05-09 MED ORDER — METHYLPREDNISOLONE ACETATE 80 MG/ML IJ SUSP
40.0000 mg | Freq: Once | INTRAMUSCULAR | Status: AC
  Administered 2019-05-09: 13:00:00 40 mg

## 2019-05-09 NOTE — Telephone Encounter (Signed)
Called patient and left message to advise. 

## 2019-05-09 NOTE — Progress Notes (Signed)
 .  Numeric Pain Rating Scale and Functional Assessment Average Pain 6   In the last MONTH (on 0-10 scale) has pain interfered with the following?  1. General activity like being  able to carry out your everyday physical activities such as walking, climbing stairs, carrying groceries, or moving a chair?  Rating(6)   +Driver, -BT, -Dye Allergies.  

## 2019-05-09 NOTE — Progress Notes (Signed)
Crystal Garza - 49 y.o. female MRN [id number]  Date of birth: 09/17/1970  Office Visit Note: Visit Date: 05/09/2019 PCP: Dickie La, MD Referred by: Dickie La, MD  Subjective: Chief Complaint  Patient presents with  . Lower Back - Pain   HPI:  Crystal Garza is a 49 y.o. female who comes in today for planned Bilateral L4-L5 lumbar facet/medial branch block with fluoroscopic guidance.  The patient has failed conservative care including home exercise, medications, time and activity modification.  This injection will be diagnostic and hopefully therapeutic.  Please see requesting physician notes for further details and justification.  Exam shows concordant low back pain with facet joint loading and extension.   ROS Otherwise per HPI.  Assessment & Plan: Visit Diagnoses:  1. Spondylosis without myelopathy or radiculopathy, lumbar region     Plan: No additional findings.   Meds & Orders:  Meds ordered this encounter  Medications  . methylPREDNISolone acetate (DEPO-MEDROL) injection 40 mg    Orders Placed This Encounter  Procedures  . Facet Injection  . XR C-ARM NO REPORT    Follow-up: Return for C7-T1 interlaminar epidural injection off Xarelto.   Procedures: No procedures performed  Lumbar Facet Joint Intra-Articular Injection(s) with Fluoroscopic Guidance  Patient: Crystal Garza      Date of Birth: Dec 09, 1970 MRN: [id number] PCP: Dickie La, MD      Visit Date: 05/09/2019   Universal Protocol:    Date/Time: 05/09/2019  Consent Given By: the patient  Position: PRONE   Additional Comments: Vital signs were monitored before and after the procedure. Patient was prepped and draped in the usual sterile fashion. The correct patient, procedure, and site was verified.   Injection Procedure Details:  Procedure Site One Meds Administered:  Meds ordered this encounter  Medications  . methylPREDNISolone acetate (DEPO-MEDROL) injection 40 mg      Laterality: Bilateral  Location/Site:  L4-L5  Needle size: 22 guage  Needle type: Spinal  Needle Placement: Articular  Findings:  -Comments: Excellent flow of contrast producing a partial arthrogram.  Procedure Details: The fluoroscope beam is vertically oriented in AP, and the inferior recess is visualized beneath the lower pole of the inferior apophyseal process, which represents the target point for needle insertion. When direct visualization is difficult the target point is located at the medial projection of the vertebral pedicle. The region overlying each aforementioned target is locally anesthetized with a 1 to 2 ml. volume of 1% Lidocaine without Epinephrine.   The spinal needle was inserted into each of the above mentioned facet joints using biplanar fluoroscopic guidance. A 0.25 to 0.5 ml. volume of Isovue-250 was injected and a partial facet joint arthrogram was obtained. A single spot film was obtained of the resulting arthrogram.    One to 1.25 ml of the steroid/anesthetic solution was then injected into each of the facet joints noted above.   Additional Comments:  The patient tolerated the procedure well Dressing: 2 x 2 sterile gauze and Band-Aid    Post-procedure details: Patient was observed during the procedure. Post-procedure instructions were reviewed.  Patient left the clinic in stable condition.     Clinical History: MRI CERVICAL SPINE WITHOUT CONTRAST    TECHNIQUE:  Multiplanar, multisequence MR imaging of the cervical spine was  performed. No intravenous contrast was administered.    COMPARISON: April 01, 2010    FINDINGS:  Alignment: There is straightening of the normal cervical lordosis.    Vertebrae: The vertebral  body heights are well maintained. No  fracture, marrow edema,or pathologic marrow infiltration.    Cord: Normal signal and morphology.    Posterior Fossa, vertebral arteries, paraspinal tissues:    The visualized  portion of the posterior fossa is unremarkable.  Normal flow voids seen within the vertebral arteries. The paraspinal  soft tissues are unremarkable. 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    Disc levels:    C1-C2: Atlanto-axial junction is normal, without canal narrowing    C2-C3: There is a minimal central disc protrusion, however no  significant canal or neural foraminal narrowing is seen.    C3-C4: There is a minimal disc osteophyte with a tiny central disc  protrusion, however no significant canal or neural foraminal  narrowing.    C4-C5: There is a minimal disc osteophyte complex uncovertebral  osteophytes which severe right and mild left neural foraminal  narrowing. There is mild effacement anterior thecal sac.    C5-C6: There is a disc osteophyte complex with a central disc  protrusion which causes effacement of the anterior thecal sac. There  is mild bilateral neural foraminal narrowing.    C6-C7: A minimal disc osteophyte complex, however no significant  canal or neural foraminal narrowing.    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 Also at C5-C6 with a new  central disc protrusion which causes mild central canal stenosis.      Electronically Signed  By: Prudencio Pair M.D.  On: 03/08/2019 16:59  ===   MRI LUMBAR SPINE WITHOUT CONTRAST    TECHNIQUE:  Multiplanar, multisequence MR imaging of the lumbar spine was  performed. No intravenous contrast was administered.    COMPARISON: CT 01/16/2014    FINDINGS:  There is no abnormality at L3-4 or above. The discs are normal. The  canal and foramina are widely patent. The distal cord and conus are  normal with conus tip at L1.    L4-5: There is bilateral facet degeneration with facet and  ligamentous hypertrophy. There is anterolisthesis of 2  mm. The disc  bulges diffusely. No apparent compressive stenosis. There is mild  discogenic endplate edema which could be associated with back pain.    L5-S1: No disc pathology. Mild facet degeneration and hypertrophy.  No stenosis.    IMPRESSION:  Findings at L4-5 that could relate to back pain. Bilateral facet  degeneration with 2 mm of anterolisthesis. Degeneration and bulging  of the disc. Discogenic endplate edema. No apparent stenosis or  neural compression.      Electronically Signed  By: Nelson Chimes M.D.  On: 05/06/2014 09:32     Objective:  VS:  HT:    WT:   BMI:     BP:121/74  HR:68bpm  TEMP: ( )  RESP:  Physical Exam  Ortho Exam Imaging: No results found.

## 2019-05-09 NOTE — Telephone Encounter (Signed)
Yes holding xarelto for a few days is fine  Crystal Garza

## 2019-05-17 ENCOUNTER — Other Ambulatory Visit: Payer: Self-pay | Admitting: Family Medicine

## 2019-05-17 ENCOUNTER — Other Ambulatory Visit: Payer: Self-pay | Admitting: Gastroenterology

## 2019-05-17 NOTE — Procedures (Signed)
Lumbosacral Transforaminal Epidural Steroid Injection - Sub-Pedicular Approach with Fluoroscopic Guidance  Patient: Crystal Garza      Date of Birth: [birth year] MRN: [id number] PCP: Dickie La, MD      Visit Date: 03/06/2019   Universal Protocol:    Date/Time: 03/06/2019  Consent Given By: the patient  Position: PRONE  Additional Comments: Vital signs were monitored before and after the procedure. Patient was prepped and draped in the usual sterile fashion. The correct patient, procedure, and site was verified.   Injection Procedure Details:  Procedure Site One Meds Administered:  Meds ordered this encounter  Medications  . DISCONTD: betamethasone acetate-betamethasone sodium phosphate (CELESTONE) injection 12 mg    Laterality: Right  Location/Site:  L4-L5  Needle size: 22 G  Needle type: Spinal  Needle Placement: Transforaminal  Findings:    -Comments: Excellent flow of contrast along the nerve and into the epidural space.  Procedure Details: After squaring off the end-plates to get a true AP view, the C-arm was positioned so that an oblique view of the foramen as noted above was visualized. The target area is just inferior to the "nose of the scotty dog" or sub pedicular. The soft tissues overlying this structure were infiltrated with 2-3 ml. of 1% Lidocaine without Epinephrine.  The spinal needle was inserted toward the target using a "trajectory" view along the fluoroscope beam.  Under AP and lateral visualization, the needle was advanced so it did not puncture dura and was located close the 6 O'Clock position of the pedical in AP tracterory. Biplanar projections were used to confirm position. Aspiration was confirmed to be negative for CSF and/or blood. A 1-2 ml. volume of Isovue-250 was injected and flow of contrast was noted at each level. Radiographs were obtained for documentation purposes.   After attaining the desired flow of contrast documented above, a  0.5 to 1.0 ml test dose of 0.25% Marcaine was injected into each respective transforaminal space.  The patient was observed for 90 seconds post injection.  After no sensory deficits were reported, and normal lower extremity motor function was noted,   the above injectate was administered so that equal amounts of the injectate were placed at each foramen (level) into the transforaminal epidural space.   Additional Comments:  The patient tolerated the procedure well Dressing: 2 x 2 sterile gauze and Band-Aid    Post-procedure details: Patient was observed during the procedure. Post-procedure instructions were reviewed.  Patient left the clinic in stable condition.

## 2019-05-17 NOTE — Progress Notes (Signed)
Crystal Garza - 49 y.o. female MRN [id number]  Date of birth: 03-12-1971  Office Visit Note: Visit Date: 03/06/2019 PCP: Dickie La, MD Referred by: Dickie La, MD  Subjective: Chief Complaint  Patient presents with  . Lower Back - Pain  . Right Thigh - Pain   HPI: Crystal Garza is a 49 y.o. female who comes in today For reevaluation and management of low back pain and right hip pain and right hip and thigh pain laterally.  We know Crystal Garza quite well and been seeing her for many years.  She has done fairly well with intermittent L4-5 facet joint blocks for significant arthritis and listhesis of L4 on L5.  Last MRI which was reviewed again today did not show any stenosis or compression.  Again the MRI was from 2016.  She comes in today and states the last injection which was performed in September and only gave her 3 days of relief.  Interestingly she was coming in for possible repeat injection but clearly if the injection only helped for 3 days it was probably not worth repeating.  Her case is complicated by anxiety and depression as well as morbid obesity.  She has not been a great candidate for radiofrequency ablation because of her body habitus.  She reports 7 out of 10 pain.  She reports worsening pain laying down and getting up from a laying position.  It does interfere with her daily living.  She has had therapy in the past.  She has had no prior back surgery.  She had recent evaluation by Dr. Eunice Blase in our office for midline thoracic pain.  She is also endorsed neck pain at times.  There is also note that we reviewed by Dr. Nori Riis her primary care physician concerning left radicular type pain in the thigh.  She does not report any red flag complaints.  She has had no focal weakness.  She also reports her neck pain is given her a lot of fits.  She is due to have an MRI of her cervical spine performed in the next few days.  Review of Systems  Constitutional: Negative for  chills, fever, malaise/fatigue and weight loss.  HENT: Negative for hearing loss and sinus pain.   Eyes: Negative for blurred vision, double vision and photophobia.  Respiratory: Negative for cough and shortness of breath.   Cardiovascular: Negative for chest pain, palpitations and leg swelling.  Gastrointestinal: Negative for abdominal pain, nausea and vomiting.  Genitourinary: Negative for flank pain.  Musculoskeletal: Positive for back pain, joint pain and neck pain. Negative for myalgias.  Skin: Negative for itching and rash.  Neurological: Negative for tremors, focal weakness and weakness.  Endo/Heme/Allergies: Negative.   Psychiatric/Behavioral: Negative for depression.  All other systems reviewed and are negative.  Otherwise per HPI.  Assessment & Plan: Visit Diagnoses:  1. Lumbar radiculopathy   2. Spondylolisthesis of lumbar region   3. Chronic pain syndrome     Plan: Findings:  Patient with long chronic history of lumbar spine pain with spine pain typically related to facet joint arthritis and morbid obesity.  She has done well in the past with facet joint blocks.  Last facet joint injection was not very beneficial.  She is having more pain in the left thigh at this point.  This could be more radicular related to the listhesis.  I think diagnostically an L4 transforaminal injection would help try to figure out if this really is coming  from her spine.  Alternative diagnosis would be meralgia paresthetica.  I think we can do a transforaminal approach even with her body habitus.  Depending on relief would look at MRI of the lumbar spine.  Continue with home exercises and activity modification.  In terms of her neck pain we did do a full evaluation today but we did talk to her about neck pain being usually muscular in nature but would await her MRI of the cervical spine to see what that is looking like.  She can follow-up as well with Dr. Junius Roads concerning anything he had mentioned for  her thoracic pain but we could also look at that as well.  I will await the MRI of the cervical spine for review.     Meds & Orders:  Meds ordered this encounter  Medications  . DISCONTD: betamethasone acetate-betamethasone sodium phosphate (CELESTONE) injection 12 mg    Orders Placed This Encounter  Procedures  . XR C-ARM NO REPORT  . Epidural Steroid injection    Follow-up: Return if symptoms worsen or fail to improve, for MRI review after completion.   Procedures: No procedures performed  Lumbosacral Transforaminal Epidural Steroid Injection - Sub-Pedicular Approach with Fluoroscopic Guidance  Patient: Crystal Garza      Date of Birth: [birth year] MRN: [id number] PCP: Dickie La, MD      Visit Date: 03/06/2019   Universal Protocol:    Date/Time: 03/06/2019  Consent Given By: the patient  Position: PRONE  Additional Comments: Vital signs were monitored before and after the procedure. Patient was prepped and draped in the usual sterile fashion. The correct patient, procedure, and site was verified.   Injection Procedure Details:  Procedure Site One Meds Administered:  Meds ordered this encounter  Medications  . DISCONTD: betamethasone acetate-betamethasone sodium phosphate (CELESTONE) injection 12 mg    Laterality: Right  Location/Site:  L4-L5  Needle size: 22 G  Needle type: Spinal  Needle Placement: Transforaminal  Findings:    -Comments: Excellent flow of contrast along the nerve and into the epidural space.  Procedure Details: After squaring off the end-plates to get a true AP view, the C-arm was positioned so that an oblique view of the foramen as noted above was visualized. The target area is just inferior to the "nose of the scotty dog" or sub pedicular. The soft tissues overlying this structure were infiltrated with 2-3 ml. of 1% Lidocaine without Epinephrine.  The spinal needle was inserted toward the target using a "trajectory" view along  the fluoroscope beam.  Under AP and lateral visualization, the needle was advanced so it did not puncture dura and was located close the 6 O'Clock position of the pedical in AP tracterory. Biplanar projections were used to confirm position. Aspiration was confirmed to be negative for CSF and/or blood. A 1-2 ml. volume of Isovue-250 was injected and flow of contrast was noted at each level. Radiographs were obtained for documentation purposes.   After attaining the desired flow of contrast documented above, a 0.5 to 1.0 ml test dose of 0.25% Marcaine was injected into each respective transforaminal space.  The patient was observed for 90 seconds post injection.  After no sensory deficits were reported, and normal lower extremity motor function was noted,   the above injectate was administered so that equal amounts of the injectate were placed at each foramen (level) into the transforaminal epidural space.   Additional Comments:  The patient tolerated the procedure well Dressing: 2 x 2 sterile gauze  and Band-Aid    Post-procedure details: Patient was observed during the procedure. Post-procedure instructions were reviewed.  Patient left the clinic in stable condition.     Clinical History: MRI LUMBAR SPINE WITHOUT CONTRAST    TECHNIQUE:  Multiplanar, multisequence MR imaging of the lumbar spine was  performed. No intravenous contrast was administered.    COMPARISON: CT 01/16/2014    FINDINGS:  There is no abnormality at L3-4 or above. The discs are normal. The  canal and foramina are widely patent. The distal cord and conus are  normal with conus tip at L1.    L4-5: There is bilateral facet degeneration with facet and  ligamentous hypertrophy. There is anterolisthesis of 2 mm. The disc  bulges diffusely. No apparent compressive stenosis. There is mild  discogenic endplate edema which could be associated with back pain.    L5-S1: No disc pathology. Mild facet degeneration and  hypertrophy.  No stenosis.    IMPRESSION:  Findings at L4-5 that could relate to back pain. Bilateral facet  degeneration with 2 mm of anterolisthesis. Degeneration and bulging  of the disc. Discogenic endplate edema. No apparent stenosis or  neural compression.      Electronically Signed  By: Nelson Chimes M.D.  On: 05/06/2014 09:32   She reports that she has never smoked. She has never used smokeless tobacco. No results for input(s): HGBA1C, LABURIC in the last 8760 hours.  Objective:  VS:  HT:    WT:   BMI:     BP:114/70  HR:86bpm  TEMP: ( )  RESP:  Physical Exam Vitals and nursing note reviewed.  Constitutional:      General: She is not in acute distress.    Appearance: Normal appearance. She is well-developed. She is obese.  HENT:     Head: Normocephalic and atraumatic.     Nose: Nose normal.     Mouth/Throat:     Mouth: Mucous membranes are moist.     Pharynx: Oropharynx is clear.  Eyes:     Conjunctiva/sclera: Conjunctivae normal.     Pupils: Pupils are equal, round, and reactive to light.  Cardiovascular:     Rate and Rhythm: Regular rhythm.  Pulmonary:     Effort: Pulmonary effort is normal. No respiratory distress.  Abdominal:     General: There is no distension.     Palpations: Abdomen is soft.     Tenderness: There is no guarding.  Musculoskeletal:     Cervical back: Neck supple.     Right lower leg: No edema.     Left lower leg: No edema.     Comments: Patient sits with forward flexed cervical spine.  Some decreased range of motion in ranges of rotation.  She stands and ambulates without aid but is somewhat difficult going from sit to stand to full extension.  She has pain with extension of the lumbar spine.  She has no pain over the greater trochanters no pain with hip rotation good distal strength.  Skin:    General: Skin is warm and dry.     Findings: No erythema or rash.  Neurological:     General: No focal deficit present.     Mental Status:  She is alert and oriented to person, place, and time.     Motor: No abnormal muscle tone.     Coordination: Coordination normal.     Gait: Gait normal.  Psychiatric:        Mood and Affect: Mood normal.  Behavior: Behavior normal.        Thought Content: Thought content normal.     Ortho Exam Imaging: No results found.  Past Medical/Family/Surgical/Social History: Medications & Allergies reviewed per EMR, new medications updated. Patient Active Problem List   Diagnosis Date Noted  . Cervical radiculopathy at C6 03/29/2019  . Pain, dental 10/07/2018  . 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 History of pulmonary embolism 05/25/2017  . Angina, class III ([place]) 05/25/2017  . Symptoms of gastroesophageal reflux 11/09/2016  . Esophageal dysphagia   . Nausea without vomiting   . Family history of colon cancer   . Dizziness 11/08/2015  . 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 Chronic neck and back pain 02/27/2015  . Other chest pain 12/10/2014  . Medication management 08/22/2014  . Hematuria 08/22/2014  . Pain in joint, ankle and foot 05/23/2014  . Chronic anticoagulation 05/17/2014  . Chronic thoracic back pain 04/11/2014  . Right flank pain 01/17/2014  . Degenerative arthritis of left knee 10/06/2013  . Status post total knee replacement 10/06/2013  . Edema 07/28/2013  . Chronic pain 06/13/2013  . 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 Bipolar disorder ([place]) 01/19/2012  . Nephrolithiasis 02/20/2011  . Eczematous dermatitis 10/22/2010  . Iron deficiency anemia 06/27/2010  . Anxiety 06/27/2010  . Migraine 09/12/2009  . Morbid obesity-BMI 62 10/30/2008  . Osteoarthrosis, unspecified whether generalized or localized, involving lower leg 07/05/2007  . Major depressive disorder, recurrent episode ([place]) 07/01/2006  . HYPERTENSION, BENIGN SYSTEMIC 07/01/2006  . RHINITIS, ALLERGIC  07/01/2006  . Gastroesophageal reflux disease 07/01/2006   Past Medical History:  Diagnosis Date  . Anemia    iron def  . Anginal pain ([place])   . Anxiety   . Arthritis    left knee   . Back pain   . Benign gastrointestinal stromal tumor (GIST)   . Chronic knee pain   . Depression   . DVT (deep venous thrombosis) ([place])   . GERD (gastroesophageal reflux disease)   . History of kidney stones   . Hx of laparoscopic gastric banding   . Hypertension   . Migraine   . Morbid obesity ([place])   . PE (pulmonary embolism)    Family History  Problem Relation Age of Onset  . Diabetes Mother   . Diabetes Father   . Heart disease Father   . Colon cancer Father        brain tumor  . Colon polyps Father   . Colitis Father   . Irritable bowel syndrome Father   . Diabetes Maternal Grandmother   . Diabetes Maternal Grandfather   . Schizophrenia Maternal Grandfather   . Heart disease Paternal Uncle   . Diabetes Paternal Uncle   . Heart disease Paternal Grandmother   . Heart disease Paternal Grandfather   . Diabetes Maternal Aunt   . Diabetes Maternal Uncle   . Diabetes Paternal Aunt   . Cancer Other   . Hypertension Other   . Esophageal cancer Neg Hx    Past Surgical History:  Procedure Laterality Date  . [place] STUDY N/A 05/13/2016   Procedure: [place] STUDY;  Surgeon: Manus Gunning, MD;  Location: [facility];  Service: Gastroenterology;  Laterality: N/A;  . ABDOMINAL HYSTERECTOMY    . BIOPSY  02/16/2012   Procedure: BIOPSY;  Surgeon: [name], MD;  Location: [facility];  Service: General;;  biopsy of mass x 2  . BREATH TEK H PYLORI  07/28/2011   Procedure: [place];  Surgeon: [name], MD;  Location: [facility];  Service: General;  Laterality: N/A;  to be done at 745  . CESAREAN SECTION  05/1991  . COLONOSCOPY WITH PROPOFOL N/A 03/03/2016   Procedure: COLONOSCOPY WITH PROPOFOL;  Surgeon: Manus Gunning, MD;  Location: [facility];   Service: Gastroenterology;  Laterality: N/A;  . ESOPHAGEAL MANOMETRY N/A 05/13/2016   Procedure: ESOPHAGEAL MANOMETRY (EM);  Surgeon: Manus Gunning, MD;  Location: [facility];  Service: Gastroenterology;  Laterality: N/A;  . ESOPHAGOGASTRODUODENOSCOPY N/A 08/29/2012   Procedure: ESOPHAGOGASTRODUODENOSCOPY (EGD);  Surgeon: [name], MD;  Location: [facility];  Service: General;  Laterality: N/A;  . ESOPHAGOGASTRODUODENOSCOPY N/A 09/04/2014   Procedure: ESOPHAGOGASTRODUODENOSCOPY (EGD);  Surgeon: Alphonsa Overall, MD;  Location: [facility];  Service: General;  Laterality: N/A;  . ESOPHAGOGASTRODUODENOSCOPY (EGD) WITH PROPOFOL N/A 03/03/2016   Procedure: ESOPHAGOGASTRODUODENOSCOPY (EGD) WITH PROPOFOL;  Surgeon: Manus Gunning, MD;  Location: [facility];  Service: Gastroenterology;  Laterality: N/A;  . EUS  03/03/2012   Procedure: UPPER ENDOSCOPIC ULTRASOUND (EUS) LINEAR;  Surgeon: Milus Banister, MD;  Location: [facility];  Service: Endoscopy;  Laterality: N/A;  . GASTRIC ROUX-EN-Y N/A 12/07/2017   Procedure: LAPAROSCOPIC ROUX-EN-Y GASTRIC BYPASS WITH LYSIS OF ADHESIONS AND UPPER ENDOSCOPY ERAS Pathway;  Surgeon: Johnathan Hausen, MD;  Location: [facility];  Service: General;  Laterality: N/A;  . gist    . KNEE ARTHROSCOPY    . LAPAROSCOPIC GASTRECTOMY  04/08/2012   Procedure: LAPAROSCOPIC GASTRECTOMY;  Surgeon: [name], MD;  Location: [facility];  Service: General;;  removal of GIST tumor of stomach  . LAPAROSCOPIC GASTRIC SLEEVE RESECTION N/A 08/29/2012   Procedure: LAPAROSCOPIC GASTRIC SLEEVE RESECTION;  Surgeon: [name], MD;  Location: [facility];  Service: General;  Laterality: N/A;  Sleeve Gastrectomy  . LEFT HEART CATH AND CORONARY ANGIOGRAPHY N/A 05/28/2017   Procedure: LEFT HEART CATH AND CORONARY ANGIOGRAPHY;  Surgeon: Leonie Man, MD;  Location: [facility];  Service: Cardiovascular;  Laterality: N/A;  . ROUX-EN-Y GASTRIC BYPASS    . TOTAL KNEE  ARTHROPLASTY Left 10/06/2013   Procedure: LEFT TOTAL KNEE ARTHROPLASTY;  Surgeon: Mcarthur Rossetti, MD;  Location: [facility];  Service: Orthopedics;  Laterality: Left;  . TUBAL LIGATION     Social History   Occupational History  . Not on file  Tobacco Use  . Smoking status: Never Smoker  . Smokeless tobacco: Never Used  Substance and Sexual Activity  . Alcohol use: No    Alcohol/week: 0.0 standard drinks  . Drug use: No  . Sexual activity: Not on file

## 2019-05-22 ENCOUNTER — Other Ambulatory Visit: Payer: Self-pay | Admitting: Gastroenterology

## 2019-05-22 ENCOUNTER — Encounter: Payer: Medicare Other | Admitting: Physical Medicine and Rehabilitation

## 2019-05-23 ENCOUNTER — Ambulatory Visit: Payer: Medicare Other | Attending: Physical Medicine and Rehabilitation | Admitting: Physical Therapy

## 2019-05-23 ENCOUNTER — Ambulatory Visit (INDEPENDENT_AMBULATORY_CARE_PROVIDER_SITE_OTHER): Payer: Medicare Other | Admitting: Physical Medicine and Rehabilitation

## 2019-05-23 ENCOUNTER — Ambulatory Visit: Payer: Self-pay

## 2019-05-23 ENCOUNTER — Encounter: Payer: Self-pay | Admitting: Physical Therapy

## 2019-05-23 ENCOUNTER — Other Ambulatory Visit: Payer: Self-pay

## 2019-05-23 ENCOUNTER — Encounter: Payer: Self-pay | Admitting: Physical Medicine and Rehabilitation

## 2019-05-23 VITALS — BP 121/75 | HR 74

## 2019-05-23 DIAGNOSIS — M542 Cervicalgia: Secondary | ICD-10-CM | POA: Diagnosis not present

## 2019-05-23 DIAGNOSIS — M5412 Radiculopathy, cervical region: Secondary | ICD-10-CM | POA: Diagnosis not present

## 2019-05-23 DIAGNOSIS — R293 Abnormal posture: Secondary | ICD-10-CM | POA: Insufficient documentation

## 2019-05-23 DIAGNOSIS — M5010 Cervical disc disorder with radiculopathy, unspecified cervical region: Secondary | ICD-10-CM

## 2019-05-23 MED ORDER — METHYLPREDNISOLONE ACETATE 80 MG/ML IJ SUSP
40.0000 mg | Freq: Once | INTRAMUSCULAR | Status: AC
  Administered 2019-05-23: 13:00:00 40 mg

## 2019-05-23 NOTE — Therapy (Signed)
[place], [place], [address] Phone: [phone]   Fax:  [phone]  Physical Therapy Evaluation  Patient Details  Name: Crystal Garza MRN: [id number] Date of Birth: [birth year] Referring Provider (PT): Dr Edwyna Shell    Encounter Date: 05/23/2019  PT End of Session - 05/23/19 1910    Visit Number  1    Number of Visits  12    Date for PT Re-Evaluation  07/04/19    Authorization Type  MCR/MCD    PT Start Time  1015    PT Stop Time  1059    PT Time Calculation (min)  44 min    Activity Tolerance  Patient tolerated treatment well    Behavior During Therapy  [facility] for tasks assessed/performed       Past Medical History:  Diagnosis Date  . Anemia    iron def  . Anginal pain ([place])   . Anxiety   . Arthritis    left knee   . Back pain   . Benign gastrointestinal stromal tumor (GIST)   . Chronic knee pain   . Depression   . DVT (deep venous thrombosis) ([place])   . GERD (gastroesophageal reflux disease)   . History of kidney stones   . Hx of laparoscopic gastric banding   . Hypertension   . Migraine   . Morbid obesity ([place])   . PE (pulmonary embolism)     Past Surgical History:  Procedure Laterality Date  . [place] STUDY N/A 05/13/2016   Procedure: [employer] [facility] STUDY;  Surgeon: Manus Gunning, MD;  Location: [facility];  Service: Gastroenterology;  Laterality: N/A;  . ABDOMINAL HYSTERECTOMY    . BIOPSY  02/16/2012   Procedure: BIOPSY;  Surgeon: [name], MD;  Location: [facility];  Service: General;;  biopsy of mass x 2  . BREATH TEK H PYLORI  07/28/2011   Procedure: [place];  Surgeon: [name], MD;  Location: [facility];  Service: General;  Laterality: N/A;  to be done at 745  . CESAREAN SECTION  05/1991  . COLONOSCOPY WITH PROPOFOL N/A 03/03/2016   Procedure: COLONOSCOPY WITH PROPOFOL;  Surgeon: Manus Gunning, MD;  Location: [facility];  Service:  Gastroenterology;  Laterality: N/A;  . ESOPHAGEAL MANOMETRY N/A 05/13/2016   Procedure: ESOPHAGEAL MANOMETRY (EM);  Surgeon: Manus Gunning, MD;  [facility]: [facility];  Service: Gastroenterology;  Laterality: N/A;  . ESOPHAGOGASTRODUODENOSCOPY N/A 08/29/2012   Procedure: ESOPHAGOGASTRODUODENOSCOPY (EGD);  Surgeon: [name], MD;  Location: [facility];  Service: General;  Laterality: N/A;  . ESOPHAGOGASTRODUODENOSCOPY N/A 09/04/2014   Procedure: ESOPHAGOGASTRODUODENOSCOPY (EGD);  Surgeon: Alphonsa Overall, MD;  Location: [facility];  Service: General;  Laterality: N/A;  . ESOPHAGOGASTRODUODENOSCOPY (EGD) WITH PROPOFOL N/A 03/03/2016   Procedure: ESOPHAGOGASTRODUODENOSCOPY (EGD) WITH PROPOFOL;  Surgeon: Manus Gunning, MD;  [facility];  Service: Gastroenterology;  Laterality: N/A;  . EUS  03/03/2012   Procedure: UPPER ENDOSCOPIC ULTRASOUND (EUS) LINEAR;  Surgeon: Milus Banister, MD;  Location[facility];  Service: Endoscopy;  Laterality: N/A;  . GASTRIC ROUX-EN-Y N/A 12/07/2017   Procedure: LAPAROSCOPIC ROUX-EN-Y GASTRIC BYPASS WITH LYSIS OF ADHESIONS AND UPPER ENDOSCOPY ERAS Pathway;  Surgeon: Johnathan Hausen, MD;  Location: [facility];  Service: General;  Laterality: N/A;  . gist    . KNEE ARTHROSCOPY    . LAPAROSCOPIC GASTRECTOMY  04/08/2012   Procedure: LAPAROSCOPIC GASTRECTOMY;  Surgeon: [name], MD;  Location: [facility];  Service: General;;  removal of GIST tumor of stomach  . LAPAROSCOPIC GASTRIC SLEEVE RESECTION N/A 08/29/2012   Procedure: LAPAROSCOPIC GASTRIC SLEEVE RESECTION;  Surgeon: [name], MD;  Location: [facility];  Service: General;  Laterality: N/A;  Sleeve Gastrectomy  . LEFT HEART CATH AND CORONARY ANGIOGRAPHY N/A 05/28/2017   Procedure: LEFT HEART CATH AND CORONARY ANGIOGRAPHY;  Surgeon: Leonie Man, MD;  Location: [facility];  Service: Cardiovascular;  Laterality: N/A;  . ROUX-EN-Y GASTRIC BYPASS    . TOTAL KNEE ARTHROPLASTY Left  10/06/2013   Procedure: LEFT TOTAL KNEE ARTHROPLASTY;  Surgeon: Mcarthur Rossetti, MD;  Location: [facility];  Service: Orthopedics;  Laterality: Left;  . TUBAL LIGATION      There were no vitals filed for this visit.   Subjective Assessment - 05/23/19 1019    Subjective  Patient had an incidious onset of neck pain 6 months ago. She has had neck pain in the past. She has had trigger point dry needling which helped. She has pain that radiates down her left arm.    How long can you sit comfortably?  Neck becomes tired when she is sitting    Diagnostic tests  MRI: multilevel spondylosis worse at C4-C5    Patient Stated Goals  less pain in her neck    Currently in Pain?  Yes    Pain Score  8     Pain Location  Neck    Pain Orientation  Left    Pain Descriptors / Indicators  Aching;Shooting    Pain Type  Chronic pain    Pain Radiating Towards  left arm    Pain Onset  More than a month ago    Pain Frequency  Constant    Aggravating Factors   sitting    Pain Relieving Factors  ben-gay;    Effect of Pain on Daily Activities  difficulty sitting for any period of time         [facility] PT Assessment - 05/23/19 0001      Assessment   Medical Diagnosis  Cervicalgia; Left Cervical Radiuclopathy     Referring Provider (PT)  Dr Edwyna Shell     Onset Date/Surgical Date  --   increased symptoms over the past 6 months    Hand Dominance  Right    Next MD Visit  Has apointment for a shot today     Prior Therapy  Had therapy inthe past for her neck. had TPDN which helepd. Also had therapy here for her total knee.       Precautions   Precaution Comments  On Xeralto for blood clots       Restrictions   Weight Bearing Restrictions  No      Balance Screen   Has the patient fallen in the past 6 months  No    Has the patient had a decrease in activity level because of a fear of falling?   No    Is the patient reluctant to leave their home because of a fear of falling?   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      Cognition   Overall Cognitive Status  Within Functional Limits for tasks assessed    Attention  Focused  Focused Attention  Appears intact    Memory  Appears intact    Awareness  Appears intact    Problem Solving  Appears intact      Observation/Other Assessments   Focus on Therapeutic Outcomes (FOTO)   56% limitation       Sensation   Light Touch  Appears Intact      Coordination   Gross Motor Movements are Fluid and Coordinated  Yes    Fine Motor Movements are Fluid and Coordinated  Yes      Posture/Postural Control   Posture Comments  forward head, rouneded shoulders       ROM / Strength   AROM / PROM / Strength  AROM;Strength;PROM      AROM   Overall AROM Comments  shoulder flexion to 90 degrees with pain; painful with active shoulder external rotation; fulls houldr internal rotation     AROM Assessment Site  Cervical    Cervical Flexion  20 with pain     Cervical Extension  30 with pain     Cervical - Left Side Bend  painful     Cervical - Right Rotation  40 with pain on the left     Cervical - Left Rotation  sits in 20 degrees of left rotation motion to 60 degrees with pain       Strength   Strength Assessment Site  Shoulder;Hand    Right/Left Shoulder  Right;Left    Right Shoulder Flexion  4/5    Right Shoulder Internal Rotation  5/5    Right Shoulder External Rotation  4+/5    Left Shoulder Flexion  4+/5    Left Shoulder Internal Rotation  4+/5    Left Shoulder External Rotation  4+/5    Right/Left hand  Right;Left      Palpation   Palpation comment  significant spasming in bilateral upper traps and cervical paraspinals L > R                 Objective measurements completed on examination: See above findings.      [place] [facility] PT Treatment/Exercise - 05/23/19 0001      Neck  Exercises: Seated   Other Seated Exercise  seated scap retraction x10       Manual Therapy   Manual Therapy  Soft tissue mobilization;Manual Traction    Soft tissue mobilization  to cervical spine and upper trap     Manual Traction  to cervical spine to reduce tension. patient reported improved pain       Neck Exercises: Stretches   Upper Trapezius Stretch  2 reps;20 seconds;Left    Levator Stretch  2 reps;20 seconds;Left             PT Education - 05/23/19 1909    Education Details  reviewed HEp and symptom mangement; improtance of posture.    Person(s) Educated  Patient    Methods  Explanation;Demonstration;Tactile cues;Verbal cues;Handout    Comprehension  Returned demonstration;Verbal cues required;Tactile cues required;Verbalized understanding       PT Short Term Goals - 05/23/19 1918      PT SHORT TERM GOAL #1   Title  Patient will increase left shoulder flexion strength to 4+/5    Time  3    Period  Weeks    Status  New    Target Date  06/13/19      PT SHORT TERM GOAL #2   Title  Patient will increase cervical rotation by 15 degrees  bilateral    Time  3    Period  Weeks    Status  New        PT Long Term Goals - 05/23/19 1938      PT LONG TERM GOAL #1   Title  Patient will reach to overhead shelf with 2lb weight without pain in order to perfrom ADL;s    Time  6    Status  New    Target Date  07/04/19      PT LONG TERM GOAL #2   Title  Patient will demonstrate 70 degrees of cervical rotation without increased pain in order to drive    Time  6    Period  Weeks    Status  New    Target Date  07/04/19      PT LONG TERM GOAL #3   Title  Patient will demonstrate good psoture with lifting in order to transfer patient    Time  6    Period  Weeks    Status  New             Plan - 05/23/19 1911    Clinical Impression Statement  Patient is a 49 year old female with cervical spine pain with radicular pain down her left arm. Pain can reach her  left elbow at times. She has increased pain with cervical movement. She feels like her head is heavey when she sits for too long. She has spasming in her upper trap and cervical paraspinal. She would benefit from skilled therapy to improve her posture and improve her cervical motion. She has had success with trigger point dry needling in the past.    Personal Factors and Comorbidities  Comorbidity 1;Comorbidity 2    Comorbidities  obesity, cervical OA, depression, low back pain, left knee replacement    Examination-Activity Limitations  Hygiene/Grooming;Reach Overhead;Dressing    Examination-Participation Restrictions  Laundry;Shop;Meal Prep    Stability/Clinical Decision Making  Evolving/Moderate complexity    Clinical Decision Making  Moderate    Rehab Potential  Good    PT Frequency  2x / week    PT Duration  6 weeks    PT Treatment/Interventions  ADLs/Self Care Home Management;Cryotherapy;Electrical Stimulation;Iontophoresis 4mg /ml Dexamethasone;Moist Heat;Ultrasound;Traction;DME Instruction;Functional mobility training;Therapeutic activities;Therapeutic exercise;Neuromuscular re-education;Manual techniques;Patient/family education;Passive range of motion;Dry needling;Taping    PT Next Visit Plan  manual therapy to cervical spine, begin postural correction, review HEP, review past HEP; reviewed use of thera-cane; modlaities PRN    PT Home Exercise Plan  scap retraction, upper trap stretch, levator stretch all for the left    Consulted and Agree with Plan of Care  Patient       Patient will benefit from skilled therapeutic intervention in order to improve the following deficits and impairments:  Pain, Improper body mechanics, Increased fascial restricitons, Decreased mobility, Increased muscle spasms, Postural dysfunction, Decreased activity tolerance, Decreased range of motion, Impaired UE functional use, Impaired flexibility  Visit Diagnosis: Cervicalgia  Radiculopathy, cervical  region  Abnormal posture     Problem List Patient Active Problem List   Diagnosis Date Noted  . Cervical radiculopathy at C6 03/29/2019  . Pain, dental 10/07/2018  . 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 History of pulmonary embolism 05/25/2017  . Angina, class III ([place]) 05/25/2017  . Symptoms of gastroesophageal reflux 11/09/2016  . Esophageal dysphagia   . Nausea without vomiting   . Family history of colon cancer   . Dizziness 11/08/2015  .  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 Chronic neck and back pain 02/27/2015  . Other chest pain 12/10/2014  . Medication management 08/22/2014  . Hematuria 08/22/2014  . Pain in joint, ankle and foot 05/23/2014  . Chronic anticoagulation 05/17/2014  . Chronic thoracic back pain 04/11/2014  . Right flank pain 01/17/2014  . Degenerative arthritis of left knee 10/06/2013  . Status post total knee replacement 10/06/2013  . Edema 07/28/2013  . Chronic pain 06/13/2013  . 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 Gastrointestinal stromal tumor (GIST) of stomach ([place]) 02/17/2012  . Bipolar disorder ([place]) 01/19/2012  . Nephrolithiasis 02/20/2011  . Eczematous dermatitis 10/22/2010  . Iron deficiency anemia 06/27/2010  . Anxiety 06/27/2010  . Migraine 09/12/2009  . Morbid obesity-BMI 62 10/30/2008  . Osteoarthrosis, unspecified whether generalized or localized, involving lower leg 07/05/2007  . Major depressive disorder, recurrent episode ([place]) 07/01/2006  . HYPERTENSION, BENIGN SYSTEMIC 07/01/2006  . RHINITIS, ALLERGIC 07/01/2006  . Gastroesophageal reflux disease 07/01/2006    Carney Living PT DPT  05/23/2019, 7:41 PM  [facility] [address] [place], [place], [address] Phone: [phone]   Fax:  [phone]  Name: NAVY DESAUTELS MRN: [id number] Date of Birth: 10/31/1970

## 2019-05-23 NOTE — Progress Notes (Signed)
.  Numeric Pain Rating Scale and Functional Assessment Average Pain 8   In the last MONTH (on 0-10 scale) has pain interfered with the following?  1. General activity like being  able to carry out your everyday physical activities such as walking, climbing stairs, carrying groceries, or moving a chair?  Rating(8)   +Driver, +BT(xarelto was held for 2 days), -Dye Allergies.

## 2019-05-23 NOTE — Telephone Encounter (Signed)
Crystal Garza - Pt has requested a refill on desipramine multiple times. Your office note from 11-08-2018 indicates "Desipramine discontinued when patient was restarted on Lamictal and Risperdal".  I have instructed pharmacy to have pt contact her PCP but we continue get refill requests   Please advise. Should I have the pt contact her PCP regarding this refill request? Thank you

## 2019-05-23 NOTE — Telephone Encounter (Signed)
Jan we had her on that medication, I believe Dr. Havery Moros started it but PCP was the one who stopped it I guess due to interactions with medications that here she started.  It is really up to the PCP as to whether here she will allow patient to take this medication.  Thanks

## 2019-05-24 NOTE — Procedures (Signed)
Cervical Epidural Steroid Injection - Interlaminar Approach with Fluoroscopic Guidance  Patient: Crystal Garza      Date of Birth: 03-Feb-1971 MRN: [id number] PCP: Dickie La, MD      Visit Date: 05/23/2019   Universal Protocol:    Date/Time: 01/20/215:50 AM  Consent Given By: the patient  Position: PRONE  Additional Comments: Vital signs were monitored before and after the procedure. Patient was prepped and draped in the usual sterile fashion. The correct patient, procedure, and site was verified.   Injection Procedure Details:  Procedure Site One Meds Administered:  Meds ordered this encounter  Medications  . methylPREDNISolone acetate (DEPO-MEDROL) injection 40 mg     Laterality: Left  Location/Site: C7-T1  Needle size: 22 G  Needle type: spinal  Needle Placement: Paramedian epidural space  Findings:  -Comments: Excellent flow of contrast into the epidural space.  During the delivery of the injectate the patient did have some mild pain into the left shoulder but this was fleeting.  Procedure Details: Using a paramedian approach from the side mentioned above, the region overlying the inferior lamina was localized under fluoroscopic visualization and the soft tissues overlying this structure were infiltrated with 4 ml. of 1% Lidocaine without Epinephrine. A # 20 gauge, Tuohy needle was inserted into the epidural space using a paramedian approach.  The epidural space was localized using loss of resistance along with lateral and contralateral oblique bi-planar fluoroscopic views.  After negative aspirate for air, blood, and CSF, a 2 ml. volume of Isovue-250 was injected into the epidural space and the flow of contrast was observed. Radiographs were obtained for documentation purposes.   The injectate was administered into the level noted above.  Additional Comments:  The patient tolerated the procedure well Dressing: 2 x 2 sterile gauze and Band-Aid     Post-procedure details: Patient was observed during the procedure. Post-procedure instructions were reviewed.  Patient left the clinic in stable condition.

## 2019-05-24 NOTE — Progress Notes (Signed)
[name] - 49 y.o. female MRN [id number]  Date of birth: 1971/03/08  Office Visit Note: Visit Date: 05/23/2019 PCP: Dickie La, MD Referred by: Dickie La, MD  Subjective: Chief Complaint  Patient presents with  . Neck - Pain  . Right Shoulder - Pain  . Left Shoulder - Pain  . Right Arm - Pain  . Left Arm - Pain   HPI: [name] is a 49 y.o. female who comes in today For planned left C7-T1 interlaminar steroid injection for left more than right but bilateral neck shoulder and arm pain.  Please see our prior evaluation notes for further details justification.  Patient has been off of her Xarelto for 2 days.  She will return to taking that tomorrow.  ROS Otherwise per HPI.  Assessment & Plan: Visit Diagnoses:  1. Cervical disc disorder with radiculopathy     Plan: No additional findings.   Meds & Orders:  Meds ordered this encounter  Medications  . methylPREDNISolone acetate (DEPO-MEDROL) injection 40 mg    Orders Placed This Encounter  Procedures  . XR C-ARM NO REPORT  . Epidural Steroid injection    Follow-up: No follow-ups on file.   Procedures: No procedures performed  Cervical Epidural Steroid Injection - Interlaminar Approach with Fluoroscopic Guidance  Patient: Crystal Garza      Date of Birth: [birth year] MRN: [id number] PCP: Dickie La, MD      Visit Date: 05/23/2019   Universal Protocol:    Date/Time: 01/20/215:50 AM  Consent Given By: the patient  Position: PRONE  Additional Comments: Vital signs were monitored before and after the procedure. Patient was prepped and draped in the usual sterile fashion. The correct patient, procedure, and site was verified.   Injection Procedure Details:  Procedure Site One Meds Administered:  Meds ordered this encounter  Medications  . methylPREDNISolone acetate (DEPO-MEDROL) injection 40 mg     Laterality: Left  Location/Site: C7-T1  Needle size: 22 G  Needle type:  spinal  Needle Placement: Paramedian epidural space  Findings:  -Comments: Excellent flow of contrast into the epidural space.  During the delivery of the injectate the patient did have some mild pain into the left shoulder but this was fleeting.  Procedure Details: Using a paramedian approach from the side mentioned above, the region overlying the inferior lamina was localized under fluoroscopic visualization and the soft tissues overlying this structure were infiltrated with 4 ml. of 1% Lidocaine without Epinephrine. A # 20 gauge, Tuohy needle was inserted into the epidural space using a paramedian approach.  The epidural space was localized using loss of resistance along with lateral and contralateral oblique bi-planar fluoroscopic views.  After negative aspirate for air, blood, and CSF, a 2 ml. volume of Isovue-250 was injected into the epidural space and the flow of contrast was observed. Radiographs were obtained for documentation purposes.   The injectate was administered into the level noted above.  Additional Comments:  The patient tolerated the procedure well Dressing: 2 x 2 sterile gauze and Band-Aid    Post-procedure details: Patient was observed during the procedure. Post-procedure instructions were reviewed.  Patient left the clinic in stable condition.     Clinical History: MRI CERVICAL SPINE WITHOUT CONTRAST    TECHNIQUE:  Multiplanar, multisequence MR imaging of the cervical spine was  performed. No intravenous contrast was administered.    COMPARISON: April 01, 2010    FINDINGS:  Alignment: There is straightening of the normal  cervical lordosis.    Vertebrae: The vertebral body heights are well maintained. No  fracture, marrow edema,or pathologic marrow infiltration.    Cord: Normal signal and morphology.    Posterior Fossa, vertebral arteries, paraspinal tissues:    The visualized portion of the posterior fossa is unremarkable.  Normal flow  voids seen within the vertebral arteries. The paraspinal  soft tissues are unremarkable. 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    Disc levels:    C1-C2: Atlanto-axial junction is normal, without canal narrowing    C2-C3: There is a minimal central disc protrusion, however no  significant canal or neural foraminal narrowing is seen.    C3-C4: There is a minimal disc osteophyte with a tiny central disc  protrusion, however no significant canal or neural foraminal  narrowing.    C4-C5: There is a minimal disc osteophyte complex uncovertebral  osteophytes which severe right and mild left neural foraminal  narrowing. There is mild effacement anterior thecal sac.    C5-C6: There is a disc osteophyte complex with a central disc  protrusion which causes effacement of the anterior thecal sac. There  is mild bilateral neural foraminal narrowing.    C6-C7: A minimal disc osteophyte complex, however no significant  canal or neural foraminal narrowing.    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 Also at C5-C6 with a new  central disc protrusion which causes mild central canal stenosis.      Electronically Signed  By: Prudencio Pair M.D.  On: 03/08/2019 16:59  ===   MRI LUMBAR SPINE WITHOUT CONTRAST    TECHNIQUE:  Multiplanar, multisequence MR imaging of the lumbar spine was  performed. No intravenous contrast was administered.    COMPARISON: CT 01/16/2014    FINDINGS:  There is no abnormality at L3-4 or above. The discs are normal. The  canal and foramina are widely patent. The distal cord and conus are  normal with conus tip at L1.    L4-5: There is bilateral facet degeneration with facet and  ligamentous hypertrophy. There is anterolisthesis of 2 mm. The disc  bulges diffusely. No apparent compressive  stenosis. There is mild  discogenic endplate edema which could be associated with back pain.    L5-S1: No disc pathology. Mild facet degeneration and hypertrophy.  No stenosis.    IMPRESSION:  Findings at L4-5 that could relate to back pain. Bilateral facet  degeneration with 2 mm of anterolisthesis. Degeneration and bulging  of the disc. Discogenic endplate edema. No apparent stenosis or  neural compression.      Electronically Signed  By: Nelson Chimes M.D.  On: 05/06/2014 09:32   She reports that she has never smoked. She has never used smokeless tobacco. No results for input(s): HGBA1C, LABURIC in the last 8760 hours.  Objective:  VS:  HT:    WT:   BMI:     BP:121/75  HR:74bpm  TEMP: ( )  RESP:  Physical Exam  Ortho Exam Imaging: XR C-ARM NO REPORT  Result Date: 05/23/2019 Please see Notes tab for imaging impression.   Past Medical/Family/Surgical/Social History: Medications & Allergies reviewed per EMR, new medications updated. Patient Active Problem List   Diagnosis Date Noted  . Cervical radiculopathy at C6 03/29/2019  . Pain, dental 10/07/2018  . 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 History  of pulmonary embolism 05/25/2017  . Angina, class III ([place]) 05/25/2017  . Symptoms of gastroesophageal reflux 11/09/2016  . Esophageal dysphagia   . Nausea without vomiting   . Family history of colon cancer   . Dizziness 11/08/2015  . 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 Chronic neck and back pain 02/27/2015  . Other chest pain 12/10/2014  . Medication management 08/22/2014  . Hematuria 08/22/2014  . Pain in joint, ankle and foot 05/23/2014  . Chronic anticoagulation 05/17/2014  . Chronic thoracic back pain 04/11/2014  . Right flank pain 01/17/2014  . Degenerative arthritis of left knee 10/06/2013  . Status post total knee replacement 10/06/2013  . Edema 07/28/2013  . Chronic pain 06/13/2013  . 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 Bipolar disorder ([place]) 01/19/2012  . Nephrolithiasis 02/20/2011  . Eczematous dermatitis 10/22/2010  . Iron deficiency anemia 06/27/2010  . Anxiety 06/27/2010  . Migraine 09/12/2009  . Morbid obesity-BMI 62 10/30/2008  . Osteoarthrosis, unspecified whether generalized or localized, involving lower leg 07/05/2007  . Major depressive disorder, recurrent episode ([place]) 07/01/2006  . HYPERTENSION, BENIGN SYSTEMIC 07/01/2006  . RHINITIS, ALLERGIC 07/01/2006  . Gastroesophageal reflux disease 07/01/2006   Past Medical History:  Diagnosis Date  . Anemia    iron def  . Anginal pain ([place])   . Anxiety   . Arthritis    left knee   . Back pain   . Benign gastrointestinal stromal tumor (GIST)   . Chronic knee pain   . Depression   . DVT (deep venous thrombosis) ([place])   . GERD (gastroesophageal reflux disease)   . History of kidney stones   . Hx of laparoscopic gastric banding   . Hypertension   . Migraine   . Morbid obesity ([place])   . PE (pulmonary embolism)    Family History  Problem Relation Age of Onset  . Diabetes Mother   . Diabetes Father   . Heart disease Father   . Colon cancer Father        brain tumor  . Colon polyps Father   . Colitis Father   . Irritable bowel syndrome Father   . Diabetes Maternal Grandmother   . Diabetes Maternal Grandfather   . Schizophrenia Maternal Grandfather   . Heart disease Paternal Uncle   . Diabetes Paternal Uncle   . Heart disease Paternal Grandmother   . Heart disease Paternal Grandfather   . Diabetes Maternal Aunt   . Diabetes Maternal Uncle   . Diabetes Paternal Aunt   . Cancer Other   . Hypertension Other   . Esophageal cancer Neg Hx    Past Surgical History:  Procedure Laterality Date  . [place] STUDY N/A 05/13/2016   Procedure: [place] STUDY;  Surgeon: Manus Gunning, MD;  [facility];  Service: Gastroenterology;  Laterality: N/A;    . ABDOMINAL HYSTERECTOMY    . BIOPSY  02/16/2012   Procedure: BIOPSY;  Surgeon: [name], MD;  [facility];  Service: General;;  biopsy of mass x 2  . BREATH TEK H PYLORI  07/28/2011   Procedure: [place];  Surgeon: [name], MD;  [facility]: [facility];  Service: General;  Laterality: N/A;  to be done at 745  . CESAREAN SECTION  05/1991  . COLONOSCOPY WITH PROPOFOL N/A 03/03/2016   Procedure: COLONOSCOPY WITH PROPOFOL;  Surgeon: Manus Gunning, MD;  [facility];  Service: Gastroenterology;  Laterality: N/A;  . ESOPHAGEAL MANOMETRY N/A 05/13/2016   Procedure: ESOPHAGEAL MANOMETRY (EM);  Surgeon: Manus Gunning, MD;  [facility];  Service: Gastroenterology;  Laterality: N/A;  . ESOPHAGOGASTRODUODENOSCOPY N/A 08/29/2012   Procedure: ESOPHAGOGASTRODUODENOSCOPY (EGD);  Surgeon: [name], MD;  [facility];  Service: General;  Laterality: N/A;  . ESOPHAGOGASTRODUODENOSCOPY N/A 09/04/2014   Procedure: ESOPHAGOGASTRODUODENOSCOPY (EGD);  Surgeon: Alphonsa Overall, MD;  [facility];  Service: General;  Laterality: N/A;  . ESOPHAGOGASTRODUODENOSCOPY (EGD) WITH PROPOFOL N/A 03/03/2016   Procedure: ESOPHAGOGASTRODUODENOSCOPY (EGD) WITH PROPOFOL;  Surgeon: Manus Gunning, MD;  [facility];  Service: Gastroenterology;  Laterality: N/A;  . EUS  03/03/2012   Procedure: UPPER ENDOSCOPIC ULTRASOUND (EUS) LINEAR;  Surgeon: Milus Banister, MD;  [facility];  Service: Endoscopy;  Laterality: N/A;  . GASTRIC ROUX-EN-Y N/A 12/07/2017   Procedure: LAPAROSCOPIC ROUX-EN-Y GASTRIC BYPASS WITH LYSIS OF ADHESIONS AND UPPER ENDOSCOPY ERAS Pathway;  Surgeon: Johnathan Hausen, MD;  [facility];  Service: General;  Laterality: N/A;  . gist    . KNEE ARTHROSCOPY    . LAPAROSCOPIC GASTRECTOMY  04/08/2012   Procedure: LAPAROSCOPIC GASTRECTOMY;  Surgeon: [name], MD;  [facility];  Service: General;;   removal of GIST tumor of stomach  . LAPAROSCOPIC GASTRIC SLEEVE RESECTION N/A 08/29/2012   Procedure: LAPAROSCOPIC GASTRIC SLEEVE RESECTION;  Surgeon: [name], MD;  [facility];  Service: General;  Laterality: N/A;  Sleeve Gastrectomy  . LEFT HEART CATH AND CORONARY ANGIOGRAPHY N/A 05/28/2017   Procedure: LEFT HEART CATH AND CORONARY ANGIOGRAPHY;  Surgeon: Leonie Man, MD;  Location: [facility];  Service: Cardiovascular;  Laterality: N/A;  . ROUX-EN-Y GASTRIC BYPASS    . TOTAL KNEE ARTHROPLASTY Left 10/06/2013   Procedure: LEFT TOTAL KNEE ARTHROPLASTY;  Surgeon: Mcarthur Rossetti, MD;  [facility];  Service: Orthopedics;  Laterality: Left;  . TUBAL LIGATION     Social History   Occupational History  . Not on file  Tobacco Use  . Smoking status: Never Smoker  . Smokeless tobacco: Never Used  Substance and Sexual Activity  . Alcohol use: No    Alcohol/week: 0.0 standard drinks  . Drug use: No  . Sexual activity: Not on file

## 2019-06-01 DIAGNOSIS — Z20822 Contact with and (suspected) exposure to covid-19: Secondary | ICD-10-CM | POA: Diagnosis not present

## 2019-06-02 ENCOUNTER — Ambulatory Visit: Payer: Medicare Other | Admitting: Physical Therapy

## 2019-06-05 ENCOUNTER — Other Ambulatory Visit: Payer: Self-pay | Admitting: Family Medicine

## 2019-06-05 ENCOUNTER — Encounter (HOSPITAL_COMMUNITY): Payer: Self-pay

## 2019-06-05 ENCOUNTER — Ambulatory Visit: Payer: Medicare Other | Admitting: Physical Therapy

## 2019-06-06 MED ORDER — ALPRAZOLAM 1 MG PO TABS: ORAL_TABLET | ORAL | 5 refills | Status: DC

## 2019-06-08 ENCOUNTER — Ambulatory Visit: Payer: Medicare Other | Admitting: Physical Therapy

## 2019-06-12 ENCOUNTER — Other Ambulatory Visit: Payer: Self-pay

## 2019-06-12 ENCOUNTER — Ambulatory Visit: Payer: Medicare Other | Attending: Physical Medicine and Rehabilitation | Admitting: Physical Therapy

## 2019-06-12 ENCOUNTER — Encounter: Payer: Self-pay | Admitting: Physical Therapy

## 2019-06-12 DIAGNOSIS — M542 Cervicalgia: Secondary | ICD-10-CM

## 2019-06-12 DIAGNOSIS — R293 Abnormal posture: Secondary | ICD-10-CM

## 2019-06-12 DIAGNOSIS — M5412 Radiculopathy, cervical region: Secondary | ICD-10-CM | POA: Diagnosis not present

## 2019-06-12 NOTE — Therapy (Addendum)
[place], [place], [address] Phone: [phone]   Fax:  [phone]  Physical Therapy Treatment/Discharge   Patient Details  Name: Crystal Garza MRN: [id number] Date of Birth: 1970/06/04 Referring Provider (PT): Dr Edwyna Shell    Encounter Date: 06/12/2019  PT End of Session - 06/12/19 1026    Visit Number  2    Number of Visits  12    Date for PT Re-Evaluation  07/04/19    Authorization Type  MCR/MCD    PT Start Time  0932    PT Stop Time  1023    PT Time Calculation (min)  51 min    Activity Tolerance  Patient tolerated treatment well    Behavior During Therapy  [facility] for tasks assessed/performed       Past Medical History:  Diagnosis Date  . Anemia    iron def  . Anginal pain ([place])   . Anxiety   . Arthritis    left knee   . Back pain   . Benign gastrointestinal stromal tumor (GIST)   . Chronic knee pain   . Depression   . DVT (deep venous thrombosis) ([place])   . GERD (gastroesophageal reflux disease)   . History of kidney stones   . Hx of laparoscopic gastric banding   . Hypertension   . Migraine   . Morbid obesity ([place])   . PE (pulmonary embolism)     Past Surgical History:  Procedure Laterality Date  . [place] STUDY N/A 05/13/2016   Procedure: [place] STUDY;  Surgeon: Manus Gunning, MD;  [facility];  Service: Gastroenterology;  Laterality: N/A;  . ABDOMINAL HYSTERECTOMY    . BIOPSY  02/16/2012   Procedure: BIOPSY;  Surgeon: [name], MD;  [facility];  Service: General;;  biopsy of mass x 2  . BREATH TEK H PYLORI  07/28/2011   Procedure: [place];  Surgeon: [name], MD;  [facility];  Service: General;  Laterality: N/A;  to be done at 745  . CESAREAN SECTION  05/1991  . COLONOSCOPY WITH PROPOFOL N/A 03/03/2016   Procedure: COLONOSCOPY WITH PROPOFOL;  Surgeon: Manus Gunning, MD;  [facility];   Service: Gastroenterology;  Laterality: N/A;  . ESOPHAGEAL MANOMETRY N/A 05/13/2016   Procedure: ESOPHAGEAL MANOMETRY (EM);  Surgeon: Manus Gunning, MD;  [facility];  Service: Gastroenterology;  Laterality: N/A;  . ESOPHAGOGASTRODUODENOSCOPY N/A 08/29/2012   Procedure: ESOPHAGOGASTRODUODENOSCOPY (EGD);  Surgeon: [name], MD;  [facility];  Service: General;  Laterality: N/A;  . ESOPHAGOGASTRODUODENOSCOPY N/A 09/04/2014   Procedure: ESOPHAGOGASTRODUODENOSCOPY (EGD);  Surgeon: Alphonsa Overall, MD;  [facility];  Service: General;  Laterality: N/A;  . ESOPHAGOGASTRODUODENOSCOPY (EGD) WITH PROPOFOL N/A 03/03/2016   Procedure: ESOPHAGOGASTRODUODENOSCOPY (EGD) WITH PROPOFOL;  Surgeon: Manus Gunning, MD;  [facility];  Service: Gastroenterology;  Laterality: N/A;  . EUS  03/03/2012   Procedure: UPPER ENDOSCOPIC ULTRASOUND (EUS) LINEAR;  Surgeon: Milus Banister, MD;  [facility];  Service: Endoscopy;  Laterality: N/A;  . GASTRIC ROUX-EN-Y N/A 12/07/2017   Procedure: LAPAROSCOPIC ROUX-EN-Y GASTRIC BYPASS WITH LYSIS OF ADHESIONS AND UPPER ENDOSCOPY ERAS Pathway;  Surgeon: Johnathan Hausen, MD;  [facility];  Service: General;  Laterality: N/A;  . gist    . KNEE ARTHROSCOPY    . LAPAROSCOPIC GASTRECTOMY  04/08/2012   Procedure: LAPAROSCOPIC GASTRECTOMY;  Surgeon: [name],  MD;  [facility];  Service: General;;  removal of GIST tumor of stomach  . LAPAROSCOPIC GASTRIC SLEEVE RESECTION N/A 08/29/2012   Procedure: LAPAROSCOPIC GASTRIC SLEEVE RESECTION;  Surgeon: [name], MD;  [facility];  Service: General;  Laterality: N/A;  Sleeve Gastrectomy  . LEFT HEART CATH AND CORONARY ANGIOGRAPHY N/A 05/28/2017   Procedure: LEFT HEART CATH AND CORONARY ANGIOGRAPHY;  Surgeon: Leonie Man, MD;  Location: [facility];  Service: Cardiovascular;  Laterality: N/A;  . ROUX-EN-Y GASTRIC BYPASS    . TOTAL KNEE  ARTHROPLASTY Left 10/06/2013   Procedure: LEFT TOTAL KNEE ARTHROPLASTY;  Surgeon: Mcarthur Rossetti, MD;  [facility];  Service: Orthopedics;  Laterality: Left;  . TUBAL LIGATION      There were no vitals filed for this visit.  Subjective Assessment - 06/12/19 1003    Subjective  Patient reports he neck is about the same. Crystal Garza feels stiff this morning. Crystal Garza was sore over the weekend.    How long can you sit comfortably?  Neck becomes tired when Crystal Garza is sitting    Diagnostic tests  MRI: multilevel spondylosis worse at C4-C5    Patient Stated Goals  less pain in Crystal Garza neck    Currently in Pain?  Yes    Pain Score  6     Pain Location  Neck    Pain Orientation  Left    Pain Descriptors / Indicators  Aching    Pain Type  Chronic pain    Pain Radiating Towards  left arm    Pain Onset  More than a month ago    Pain Frequency  Constant    Aggravating Factors   sitting    Pain Relieving Factors  ben-gay    Effect of Pain on Daily Activities  difficulty sitting for too long                       [facility] Adult PT Treatment/Exercise - 06/12/19 1000      Neck Exercises: Standing   Other Standing Exercises  scap retraction x20 yellow with cuing for posture     Other Standing Exercises  shoulder extension x20 yellow       Neck Exercises: Seated   Other Seated Exercise  bilateral ER x20 yellow; horisontal abduction with cuing for breathing x20 yellow       Manual Therapy   Manual therapy comments  skilled palpation of trigger points     Soft tissue mobilization  trigger point release to upper traps and cervical paraspinals     Manual Traction  to cervical spine to reduce tension. sub-occipital release      Neck Exercises: Stretches   Upper Trapezius Stretch  2 reps;20 seconds;Left    Levator Stretch  2 reps;20 seconds;Left       Trigger Point Dry Needling - 06/12/19 0001    Consent Given?  Yes    Education Handout Provided  Yes    Muscles Treated Head and Neck   Upper trapezius;Cervical multifidi    Other Dry Needling  c6 c7 area 1 needle each side; 2 spots needled in left upper trap and 1 in the right     Upper Trapezius Response  Twitch reponse elicited;Palpable increased muscle length    Cervical multifidi Response  Twitch reponse elicited;Palpable increased muscle length           PT Education - 06/12/19 1038    Education Details  reviewed  HEp and symptom magement    Person(s) Educated  Patient    Methods  Explanation;Demonstration;Verbal cues;Tactile cues    Comprehension  Verbalized understanding;Returned demonstration;Verbal cues required;Tactile cues required       PT Short Term Goals - 05/23/19 1918      PT SHORT TERM GOAL #1   Title  Patient will increase left shoulder flexion strength to 4+/5    Time  3    Period  Weeks    Status  New    Target Date  06/13/19      PT SHORT TERM GOAL #2   Title  Patient will increase cervical rotation by 15 degrees bilateral    Time  3    Period  Weeks    Status  New        PT Long Term Goals - 05/23/19 1938      PT LONG TERM GOAL #1   Title  Patient will reach to overhead shelf with 2lb weight without pain in order to perfrom ADL;s    Time  6    Status  New    Target Date  07/04/19      PT LONG TERM GOAL #2   Title  Patient will demonstrate 70 degrees of cervical rotation without increased pain in order to drive    Time  6    Period  Weeks    Status  New    Target Date  07/04/19      PT LONG TERM GOAL #3   Title  Patient will demonstrate good psoture with lifting in order to transfer patient    Time  6    Period  Weeks    Status  New            Plan - 06/12/19 1005    Clinical Impression Statement  Patient tolerated needling well. Crystal Garza had a good twtcih response in Crystal Garza paraspinals and Crystal Garza upper trap. Crystal Garza was advised how to reduce post needle soreness. Crystal Garza was given an updated HEP for exercises. Crystal Garza was given a yellow band for home.    Comorbidities  obesity,  cervical OA, depression, low back pain, left knee replacement    Examination-Activity Limitations  Hygiene/Grooming;Reach Overhead;Dressing    Examination-Participation Restrictions  Laundry;Shop;Meal Prep    Stability/Clinical Decision Making  Evolving/Moderate complexity    Clinical Decision Making  Moderate    Rehab Potential  Good    PT Frequency  2x / week    PT Duration  6 weeks    PT Treatment/Interventions  ADLs/Self Care Home Management;Cryotherapy;Electrical Stimulation;Iontophoresis 58m/ml Dexamethasone;Moist Heat;Ultrasound;Traction;DME Instruction;Functional mobility training;Therapeutic activities;Therapeutic exercise;Neuromuscular re-education;Manual techniques;Patient/family education;Passive range of motion;Dry needling;Taping    PT Next Visit Plan  manual therapy to cervical spine, begin postural correction, review HEP, review past HEP; reviewed use of thera-cane; modlaities PRN    PT Home Exercise Plan  scap retraction, upper trap stretch, levator stretch all for the left       Patient will benefit from skilled therapeutic intervention in order to improve the following deficits and impairments:  Pain, Improper body mechanics, Increased fascial restricitons, Decreased mobility, Increased muscle spasms, Postural dysfunction, Decreased activity tolerance, Decreased range of motion, Impaired UE functional use, Impaired flexibility  Visit Diagnosis: Cervicalgia  Radiculopathy, cervical region  Abnormal posture  PHYSICAL THERAPY DISCHARGE SUMMARY  Visits from Start of Care:   Current functional level related to goals / functional outcomes: Unknown patient didn't return    Remaining deficits: Unknown    Education /  Equipment: Unknown   Plan: Patient agrees to discharge.  Patient goals were not met. Patient is being discharged due to not returning since the last visit.  ?????        Problem List Patient Active Problem List   Diagnosis Date Noted  . Cervical  radiculopathy at C6 03/29/2019  . Pain, dental 10/07/2018  . 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 History of pulmonary embolism 05/25/2017  . Angina, class III ([place]) 05/25/2017  . Symptoms of gastroesophageal reflux 11/09/2016  . Esophageal dysphagia   . Nausea without vomiting   . Family history of colon cancer   . Dizziness 11/08/2015  . 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 Chronic neck and back pain 02/27/2015  . Other chest pain 12/10/2014  . Medication management 08/22/2014  . Hematuria 08/22/2014  . Pain in joint, ankle and foot 05/23/2014  . Chronic anticoagulation 05/17/2014  . Chronic thoracic back pain 04/11/2014  . Right flank pain 01/17/2014  . Degenerative arthritis of left knee 10/06/2013  . Status post total knee replacement 10/06/2013  . Edema 07/28/2013  . Chronic pain 06/13/2013  . 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 Bipolar disorder ([place]) 01/19/2012  . Nephrolithiasis 02/20/2011  . Eczematous dermatitis 10/22/2010  . Iron deficiency anemia 06/27/2010  . Anxiety 06/27/2010  . Migraine 09/12/2009  . Morbid obesity-BMI 62 10/30/2008  . Osteoarthrosis, unspecified whether generalized or localized, involving lower leg 07/05/2007  . Major depressive disorder, recurrent episode ([place]) 07/01/2006  . HYPERTENSION, BENIGN SYSTEMIC 07/01/2006  . RHINITIS, ALLERGIC 07/01/2006  . Gastroesophageal reflux disease 07/01/2006    Carney Living 06/12/2019, 2:43 PM  [facility] [address] [place], [place], [address] Phone: [phone]   Fax:  [phone]  Name: GALIA RAHM MRN: [id number] Date of Birth: [birth year]

## 2019-06-14 ENCOUNTER — Ambulatory Visit: Payer: Medicare Other | Admitting: Physical Therapy

## 2019-06-20 ENCOUNTER — Ambulatory Visit: Payer: Medicare Other | Admitting: Physical Therapy

## 2019-06-20 ENCOUNTER — Telehealth: Payer: Self-pay | Admitting: Physical Medicine and Rehabilitation

## 2019-06-20 NOTE — Telephone Encounter (Signed)
She might want her primary care physician to refer her to pain management.  I am happy as always to see her or she can also talk to one of the orthopedic surgeons.

## 2019-06-21 NOTE — Telephone Encounter (Signed)
Called patient to advise. She states that she has been in pain management before and is not going to do that again. She will ollow up with her PCP to discuss options.

## 2019-06-23 ENCOUNTER — Other Ambulatory Visit: Payer: Self-pay | Admitting: Family Medicine

## 2019-06-23 ENCOUNTER — Ambulatory Visit: Payer: Medicare Other | Admitting: Physical Therapy

## 2019-06-23 ENCOUNTER — Other Ambulatory Visit: Payer: Self-pay

## 2019-06-23 ENCOUNTER — Ambulatory Visit (INDEPENDENT_AMBULATORY_CARE_PROVIDER_SITE_OTHER): Payer: Medicare Other | Admitting: Family Medicine

## 2019-06-23 DIAGNOSIS — M5412 Radiculopathy, cervical region: Secondary | ICD-10-CM

## 2019-06-23 MED ORDER — DULOXETINE HCL 20 MG PO CPEP: 20.0000 mg | ORAL_CAPSULE | Freq: Every day | ORAL | 3 refills | Status: DC

## 2019-06-23 NOTE — Patient Instructions (Signed)
Let me see you in 3-4 weekd

## 2019-06-24 NOTE — Progress Notes (Signed)
  Crystal Garza - 49 y.o. female MRN [id number]  Date of birth: [birth year]    SUBJECTIVE:      Chief Complaint:/ HPI:  Increasing problems with trapezius and neck pain.  Has been trying home exercise program as well as some dry needling.  The dry needling seem to actually make it worse.  She is getting quite frustrated.    OBJECTIVE: BP 131/62   Ht 5\' 7"  (1.702 m)   Wt 292 lb (132.5 kg)   BMI 45.73 kg/m   Physical Exam:  Vital signs are reviewed. General: Well-developed overweight female no acute distress NECK: Full range of motion flexion extension lateral rotation.  Negative Spurling's.  Tender to palpation across the posterior neck muscles as well as bilateral trapezius.  ASSESSMENT & PLAN:  See problem based charting & AVS for pt instructions. Cervical radiculopathy at C6 ADD CYMBALTA AND F/U 3 WEEKS. DISCUSSED POTENTIAL SIDE EFFECTS.

## 2019-06-24 NOTE — Assessment & Plan Note (Signed)
ADD CYMBALTA AND F/U 3 WEEKS. DISCUSSED POTENTIAL SIDE EFFECTS.

## 2019-06-27 ENCOUNTER — Ambulatory Visit: Payer: Medicare Other | Admitting: Physical Therapy

## 2019-06-28 ENCOUNTER — Ambulatory Visit: Payer: Medicare Other | Admitting: Family Medicine

## 2019-07-14 ENCOUNTER — Other Ambulatory Visit: Payer: Self-pay

## 2019-07-14 ENCOUNTER — Ambulatory Visit (INDEPENDENT_AMBULATORY_CARE_PROVIDER_SITE_OTHER): Payer: Medicare Other | Admitting: Family Medicine

## 2019-07-14 DIAGNOSIS — M5412 Radiculopathy, cervical region: Secondary | ICD-10-CM

## 2019-07-14 MED ORDER — OXYCODONE-ACETAMINOPHEN 10-325 MG PO TABS: ORAL_TABLET | ORAL | 0 refills | Status: DC

## 2019-07-14 MED ORDER — DULOXETINE HCL 20 MG PO CPEP: 40.0000 mg | ORAL_CAPSULE | Freq: Every day | ORAL | 3 refills | Status: DC

## 2019-07-15 NOTE — Progress Notes (Signed)
  Crystal Garza - 49 y.o. female MRN [id number]  Date of birth: [birth year]    SUBJECTIVE:      Chief Complaint:/ HPI:   f/u neck pain and cervical radiculopathy. Started cymbalta. Small amt improvement. Also getting massages. Increased stressors re health of her granddaughter ( she is caretaker. ) Granddaughter dx with Crohns in addition to SS.   OBJECTIVE: BP 131/70   Ht 5\' 7"  (1.702 m)   Wt 290 lb (131.5 kg)   BMI 45.42 kg/m   Physical Exam:  Vital signs are reviewed. PSYCH: AxOx4. Good eye contact.. No psychomotor retardation or agitation. Appropriate speech fluency and content. Asks and answers questions appropriately. Mood is congruent. NECK/SHOULDR are still with significant spasm in traps and rhomboid area. macromastia  ASSESSMENT & PLAN:  See problem based charting & AVS for pt instructions. No problem-specific Assessment & Plan notes found for this encounter.

## 2019-07-15 NOTE — Assessment & Plan Note (Signed)
I again reviewed her MRI images with her and explained treatment options. The CSI the NSu gace her did not seem to help at all. I think the increasing stress level is making our treatment goals even harder to meet and she agrees. Increase cymbalta to 40 mg a day. Continue massage. Discussed stressors at length. F/u 3 weeks.

## 2019-07-19 DIAGNOSIS — Z9884 Bariatric surgery status: Secondary | ICD-10-CM | POA: Diagnosis not present

## 2019-08-08 ENCOUNTER — Ambulatory Visit: Payer: Medicare Other | Admitting: Physician Assistant

## 2019-08-11 ENCOUNTER — Ambulatory Visit (INDEPENDENT_AMBULATORY_CARE_PROVIDER_SITE_OTHER): Payer: Medicare Other | Admitting: Family Medicine

## 2019-08-11 ENCOUNTER — Encounter: Payer: Self-pay | Admitting: Family Medicine

## 2019-08-11 ENCOUNTER — Other Ambulatory Visit: Payer: Self-pay

## 2019-08-11 DIAGNOSIS — M549 Dorsalgia, unspecified: Secondary | ICD-10-CM

## 2019-08-11 DIAGNOSIS — G8929 Other chronic pain: Secondary | ICD-10-CM

## 2019-08-11 DIAGNOSIS — M542 Cervicalgia: Secondary | ICD-10-CM | POA: Diagnosis not present

## 2019-08-11 MED ORDER — DULOXETINE HCL 20 MG PO CPEP: ORAL_CAPSULE | ORAL | 3 refills | Status: DC

## 2019-08-11 NOTE — Assessment & Plan Note (Signed)
Increased somnolence with no improvement in neck pain from increase in Cymbalta so we will go back to 20 mg daily.  Restart home exercise program scapular retraction, overhead press, neck extension.  Follow-up 2 months.

## 2019-08-11 NOTE — Progress Notes (Signed)
  Crystal Garza - 49 y.o. female MRN [id number]  Date of birth: 24-Aug-1970    SUBJECTIVE:      Chief Complaint:/ HPI:  F/u neck pain and cervical radiculopathy. No improvement since last visit with increased Cymbalta from 20 mg to 40 mg, but has noted increased drowsiness after taking the higher dose. She continues to have paraspinal/trapezius tenderness and pain. She has also had headaches and left posterior leg pains shooting down from her lower back for the last week. She has been sleeping in a hospital chair as her Granddaughter has been hospitalized for sickle cell complications.    OBJECTIVE: BP 138/70   Ht 5\' 7"  (1.702 m)   Wt 285 lb (129.3 kg)   BMI 44.64 kg/m   Physical Exam:  Vital signs reviewed GEN: Obese female, NAD PSYCH: AOx4, Mood congruent, Answers questions appropriately NECK/SHOULDER: Significant tenderness over the posterior neck, trapezius and paravertebral muscles.  LEFT LEG: Obese, no obvious mass, no TTP, ROM limited due to pain, Sensation intact  ASSESSMENT & PLAN:  See problem based charting & AVS for pt instructions. No problem-specific Assessment & Plan notes found for this encounter.

## 2019-08-11 NOTE — Progress Notes (Signed)
  CAY MEANOR - 49 y.o. female MRN [id number]  Date of birth: 11-30-1970    SUBJECTIVE:      Chief Complaint:/ HPI:  Neck pain no better.  Increased somnolence with increase in Cymbalta.  Stressors unchanged.    OBJECTIVE: BP 138/70   Ht 5\' 7"  (1.702 m)   Wt 285 lb (129.3 kg)   BMI 44.64 kg/m   Physical Exam:  Vital signs are reviewed. NECK: Full range of motion.  Muscle tension and spasm bilaterally and trapezius posterior occipital muscles PSYCH: AxOx4. Good eye contact.. No psychomotor retardation or agitation. Appropriate speech fluency and content. Asks and answers questions appropriately. Mood is congruent.   ASSESSMENT & PLAN:  See problem based charting & AVS for pt instructions. Chronic neck and back pain Increased somnolence with no improvement in neck pain from increase in Cymbalta so we will go back to 20 mg daily.  Restart home exercise program scapular retraction, overhead press, neck extension.  Follow-up 2 months.

## 2019-08-14 ENCOUNTER — Other Ambulatory Visit: Payer: Self-pay | Admitting: Family Medicine

## 2019-08-14 ENCOUNTER — Telehealth: Payer: Self-pay

## 2019-08-14 MED ORDER — OXYCODONE-ACETAMINOPHEN 10-325 MG PO TABS: ORAL_TABLET | ORAL | 0 refills | Status: DC

## 2019-08-14 NOTE — Telephone Encounter (Signed)
Please send refill to pt's pharmacy.

## 2019-08-14 NOTE — Telephone Encounter (Signed)
Crystal Garza I have sent it in [facility]! ,me

## 2019-08-18 ENCOUNTER — Encounter: Payer: Self-pay | Admitting: Physician Assistant

## 2019-08-18 ENCOUNTER — Ambulatory Visit (INDEPENDENT_AMBULATORY_CARE_PROVIDER_SITE_OTHER): Payer: Medicare Other | Admitting: Physician Assistant

## 2019-08-18 ENCOUNTER — Other Ambulatory Visit: Payer: Self-pay

## 2019-08-18 VITALS — BP 126/80 | HR 74 | Temp 96.8°F | Ht 67.0 in | Wt 292.0 lb

## 2019-08-18 DIAGNOSIS — K219 Gastro-esophageal reflux disease without esophagitis: Secondary | ICD-10-CM | POA: Diagnosis not present

## 2019-08-18 DIAGNOSIS — K5909 Other constipation: Secondary | ICD-10-CM | POA: Diagnosis not present

## 2019-08-18 MED ORDER — DEXLANSOPRAZOLE 60 MG PO CPDR: 60.0000 mg | DELAYED_RELEASE_CAPSULE | Freq: Every day | ORAL | 3 refills | Status: DC

## 2019-08-18 NOTE — Progress Notes (Signed)
Agree with assessment and plan as outlined.  

## 2019-08-18 NOTE — Progress Notes (Signed)
Chief Complaint: Constipation  HPI:    Crystal Garza is a 49 year old African-American female with past medical history as listed below including DVT and PE on Xarelto, family history of colon cancer, morbid obesity status post gastric bypass surgery, history of GIST resected in 2013, chronic dysphagia and others, known to Dr. Havery Moros, who presents clinic today with a complaint of constipation.    03/03/2016 colonoscopy Dr. Havery Moros with nonbleeding internal hemorrhoids and otherwise normal.  Repeat was recommended in 5 years due to family history of colon cancer.  EGD at the same time showed a 2 cm hiatal hernia, LA grade a reflux esophagitis with suspected inflammatory related nodularity, sleeve gastrectomy and otherwise normal.  Patient was increased to omeprazole twice daily.      11/08/2018 patient seen in clinic by Tye Savoy, NP and at that time complained of abdominal pain and nausea.  It was thought she was constipated likely related to Lamictal and risperidone which have been restarted.  The plan was to gently purge the bowels.  She was advised to increase MiraLAX to 3 times daily for a few days and then back down.  Recommend she stay on twice daily MiraLAX.  Dysphagia complaint was ongoing.  That time she is not ready to pursue EGD with dilation as she realizes may not help.    Today, the patient presents to clinic and explains that her 56 year old granddaughter was recently diagnosed with Crohn's disease after a year of investigation.  She tells me that she has many the similar symptoms as her granddaughter and want to make sure that she did not have this happening.  Continues to describe constipation with hard balls after 2 or 3 days of MiraLAX twice daily followed by liquid stools, tells me that she will then have 2 to 3 days of more liquid stools and then go back to constipation.    Reflux is under control currently with Dexilant 60 mg daily.  Patient does ask for refill.    Denies  fever, chills, blood in her stool, weight loss, nausea, vomiting or symptoms that awaken her from sleep.  Past Medical History:  Diagnosis Date  . Anemia    iron def  . Anginal pain ([place])   . Anxiety   . Arthritis    left knee   . Back pain   . Benign gastrointestinal stromal tumor (GIST)   . Chronic knee pain   . Depression   . DVT (deep venous thrombosis) ([place])   . GERD (gastroesophageal reflux disease)   . History of kidney stones   . Hx of laparoscopic gastric banding   . Hypertension   . Migraine   . Morbid obesity ([place])   . PE (pulmonary embolism)     Past Surgical History:  Procedure Laterality Date  . [place] STUDY N/A 05/13/2016   Procedure: [place] STUDY;  Surgeon: Manus Gunning, MD;  Location: [facility];  Service: Gastroenterology;  Laterality: N/A;  . ABDOMINAL HYSTERECTOMY    . BIOPSY  02/16/2012   Procedure: BIOPSY;  Surgeon: [name], MD;  Location: [facility];  Service: General;;  biopsy of mass x 2  . BREATH TEK H PYLORI  07/28/2011   Procedure: [place];  Surgeon: [name], MD;  Location: [facility];  Service: General;  Laterality: N/A;  to be done at 745  . CESAREAN SECTION  05/1991  . COLONOSCOPY WITH PROPOFOL N/A 03/03/2016   Procedure: COLONOSCOPY WITH PROPOFOL;  Surgeon: Manus Gunning, MD;  Location: [facility];  Service: Gastroenterology;  Laterality: N/A;  . ESOPHAGEAL MANOMETRY N/A 05/13/2016   Procedure: ESOPHAGEAL MANOMETRY (EM);  Surgeon: Manus Gunning, MD;  Location: [facility];  Service: Gastroenterology;  Laterality: N/A;  . ESOPHAGOGASTRODUODENOSCOPY N/A 08/29/2012   Procedure: ESOPHAGOGASTRODUODENOSCOPY (EGD);  Surgeon: [name], MD;  Location: [facility];  Service: General;  Laterality: N/A;  . ESOPHAGOGASTRODUODENOSCOPY N/A 09/04/2014   Procedure: ESOPHAGOGASTRODUODENOSCOPY (EGD);  Surgeon: Alphonsa Overall, MD;  Location: [facility];  Service: General;  Laterality: N/A;  .  ESOPHAGOGASTRODUODENOSCOPY (EGD) WITH PROPOFOL N/A 03/03/2016   Procedure: ESOPHAGOGASTRODUODENOSCOPY (EGD) WITH PROPOFOL;  Surgeon: Manus Gunning, MD;  [facility]: [facility];  Service: Gastroenterology;  Laterality: N/A;  . EUS  03/03/2012   Procedure: UPPER ENDOSCOPIC ULTRASOUND (EUS) LINEAR;  Surgeon: Milus Banister, MD;  Location: [facility];  Service: Endoscopy;  Laterality: N/A;  . GASTRIC ROUX-EN-Y N/A 12/07/2017   Procedure: LAPAROSCOPIC ROUX-EN-Y GASTRIC BYPASS WITH LYSIS OF ADHESIONS AND UPPER ENDOSCOPY ERAS Pathway;  Surgeon: Johnathan Hausen, MD;  Location: [facility];  Service: General;  Laterality: N/A;  . gist    . KNEE ARTHROSCOPY    . LAPAROSCOPIC GASTRECTOMY  04/08/2012   Procedure: LAPAROSCOPIC GASTRECTOMY;  Surgeon: [name], MD;  Location: [facility];  Service: General;;  removal of GIST tumor of stomach  . LAPAROSCOPIC GASTRIC SLEEVE RESECTION N/A 08/29/2012   Procedure: LAPAROSCOPIC GASTRIC SLEEVE RESECTION;  Surgeon: [name], MD;  Location: [facility];  Service: General;  Laterality: N/A;  Sleeve Gastrectomy  . LEFT HEART CATH AND CORONARY ANGIOGRAPHY N/A 05/28/2017   Procedure: LEFT HEART CATH AND CORONARY ANGIOGRAPHY;  Surgeon: Leonie Man, MD;  Location: [facility];  Service: Cardiovascular;  Laterality: N/A;  . ROUX-EN-Y GASTRIC BYPASS    . TOTAL KNEE ARTHROPLASTY Left 10/06/2013   Procedure: LEFT TOTAL KNEE ARTHROPLASTY;  Surgeon: Mcarthur Rossetti, MD;  Location: [facility];  Service: Orthopedics;  Laterality: Left;  . TUBAL LIGATION      Current Outpatient Medications  Medication Sig Dispense Refill  . ALPRAZolam (XANAX) 1 MG tablet Take one by mouth twice a day as needed for anxiety 60 tablet 5  . Cholecalciferol (VITAMIN D-3) 125 MCG (5000 UT) TABS Take 1 tablet by mouth daily. 90 tablet 3  . DEXILANT 60 MG capsule TAKE (1) CAPSULE DAILY. 90 capsule 1  . DULoxetine (CYMBALTA) 20 MG capsule Take one by mouth daily 90 capsule 3  .  fluticasone (FLONASE) 50 MCG/ACT nasal spray Place 2 sprays into both nostrils daily. 16 g 6  . lamoTRIgine (LAMICTAL) 200 MG tablet Take 1 tablet (200 mg total) by mouth at bedtime. 90 tablet 3  . lisinopril (ZESTRIL) 10 MG tablet TAKE 1 TABLET EACH DAY. 90 tablet 3  . ondansetron (ZOFRAN-ODT) 4 MG disintegrating tablet Take 1 tablet (4 mg total) by mouth 2 (two) times daily as needed for nausea or vomiting. 60 tablet 3  . oxyCODONE-acetaminophen (PERCOCET) 10-325 MG tablet TAKE ONE TABLET 4 TIMES A DAY AS NEEDED FOR JOINT PAIN. 120 tablet 0  . risperiDONE (RISPERDAL) 1 MG tablet Take 1 tablet (1 mg total) by mouth at bedtime. 90 tablet 3  . XARELTO 10 MG TABS tablet TAKE 1 TABLET ONCE DAILY. 90 tablet 3   No current facility-administered medications for this visit.    Allergies as of 08/18/2019 - Review Complete 08/18/2019  Allergen Reaction Noted  . Codeine Rash   . Geralyn Flash [employer]  allergy] Rash 11/23/2017    Family History  Problem Relation Age of Onset  . Diabetes Mother   . Diabetes Father   . Heart disease Father   . Colon cancer Father        brain tumor  . Colon polyps Father   . Colitis Father   . Irritable bowel syndrome Father   . Diabetes Maternal Grandmother   . Diabetes Maternal Grandfather   . Schizophrenia Maternal Grandfather   . Heart disease Paternal Uncle   . Diabetes Paternal Uncle   . Heart disease Paternal Grandmother   . Heart disease Paternal Grandfather   . Diabetes Maternal Aunt   . Diabetes Maternal Uncle   . Diabetes Paternal Aunt   . Cancer Other   . Hypertension Other   . Esophageal cancer Neg Hx     Social History   Socioeconomic History  . Marital status: Single    Spouse name: Not on file  . Number of children: Not on file  . Years of education: Not on file  . Highest education level: Not on file  Occupational History  . Not on file  Tobacco Use  . Smoking status: Never Smoker  . Smokeless tobacco: Never Used  Substance and Sexual  Activity  . Alcohol use: No    Alcohol/week: 0.0 standard drinks  . Drug use: No  . Sexual activity: Not on file  Other Topics Concern  . Not on file  Social History Narrative   Patient has been on disability since 2004 for knee pain, back pain, depression and acid reflux. She does work part-time now.       She is caring for her granddaughter Amia age 18 (has sickle cell, goes to [facility] every month and has had multiple hospitalizations).    Social Determinants of Health   Financial Resource Strain:   . Difficulty of Paying Living Expenses:   Food Insecurity:   . Worried About Charity fundraiser in the Last Year:   . Arboriculturist in the Last Year:   Transportation Needs:   . Film/video editor (Medical):   Marland Kitchen Lack of Transportation (Non-Medical):   Physical Activity:   . Days of Exercise per Week:   . Minutes of Exercise per Session:   Stress:   . Feeling of Stress :   Social Connections:   . Frequency of Communication with Friends and Family:   . Frequency of Social Gatherings with Friends and Family:   . Attends Religious Services:   . Active Member of Clubs or Organizations:   . Attends Archivist Meetings:   Marland Kitchen Marital Status:   Intimate Partner Violence:   . Fear of Current or Ex-Partner:   . Emotionally Abused:   Marland Kitchen Physically Abused:   . Sexually Abused:     Review of Systems:    Constitutional: No weight loss, fever or chills Cardiovascular: No chest pain   Respiratory: No SOB  Gastrointestinal: See HPI and otherwise negative   Physical Exam:  Vital signs: BP 126/80   Pulse 74   Temp (!) 96.8 F (36 C)   Ht 5\' 7"  (1.702 m)   Wt 292 lb (132.5 kg)   BMI 45.73 kg/m   Constitutional:   Pleasant obese AA female appears to be in NAD, Well developed, Well nourished, alert and cooperative Respiratory: Respirations even and unlabored. Lungs clear to auscultation bilaterally.   No wheezes, crackles, or rhonchi.  Cardiovascular: Normal S1, S2. No  MRG. Regular rate and rhythm. No peripheral edema, cyanosis or pallor.  Gastrointestinal:  Soft, nondistended, nontender. No rebound or guarding. Normal bowel sounds. No appreciable masses or hepatomegaly. Rectal:  Not performed.  Psychiatric: Demonstrates good judgement and reason without abnormal affect or behaviors.  No recent labs/imaging.  Assessment: 1.  Chronic constipation: Erratic bowel movements with MiraLAX twice daily, last colonoscopy in 2017 with recommendations to repeat in 5 years given family history of colon cancer 2.  GERD: Controlled with Dexilant 60 mg daily  Plan: 1.  Discussed with the patient that the test that we do to diagnose Crohn's disease is a colonoscopy and she has already had this done.  It is very low possibility that she has Crohn's disease that has developed within the past 4 years especially since she mainly has constipation and no hematochezia.  She verbalized understanding.  Did discuss that her next colonoscopy is due next year. 2.  Patient would like to try something other than MiraLAX.  Prescribed Linzess 72 mcg daily 30 minutes before breakfast.  Provided her with 8 days of samples and told her to contact us if this works and we can prescribe her. 3.  Refilled Dexilant 60 mg daily #90 refill x3 4.  Patient to follow in clinic with [name] as needed.  Crystal Newer, PA-C [facility] 08/18/2019, 8:27 AM  Cc: Dickie La, MD

## 2019-08-18 NOTE — Patient Instructions (Signed)
If you are age 51 or older, your body mass index should be between 23-30. Your Body mass index is 45.73 kg/m. If this is out of the aforementioned range listed, please consider follow up with your Primary Care Provider.  If you are age 61 or younger, your body mass index should be between 19-25. Your Body mass index is 45.73 kg/m. If this is out of the aformentioned range listed, please consider follow up with your Primary Care Provider.   We have sent the following medications to your pharmacy for you to pick up at your convenience:  Dexilant 60 mg We have given you samples of Linzess mcg daily, 30 minutes before breakfast. 2 boxes with 4 pills each.  Due to recent changes in healthcare laws, you may see the results of your imaging and laboratory studies on MyChart before your provider has had a chance to review them.  We understand that in some cases there may be results that are confusing or concerning to you. Not all laboratory results come back in the same time frame and the provider may be waiting for multiple results in order to interpret others.  Please give [name] 48 hours in order for your provider to thoroughly review all the results before contacting the office for clarification of your results.

## 2019-09-04 ENCOUNTER — Telehealth: Payer: Self-pay | Admitting: Physical Medicine and Rehabilitation

## 2019-09-04 NOTE — Telephone Encounter (Signed)
Patient is requesting another injection for her lower back pain. Bilateral L4-5 facets 05/09/19. Please advise.

## 2019-09-04 NOTE — Telephone Encounter (Signed)
All we can do is repeat 3 to 4 months ok, PT, Weight loss

## 2019-09-04 NOTE — Procedures (Signed)
Lumbar Facet Joint Intra-Articular Injection(s) with Fluoroscopic Guidance  Patient: Crystal Garza      Date of Birth: 1971-04-13 MRN: [id number] PCP: Dickie La, MD      Visit Date: 05/09/2019   Universal Protocol:    Date/Time: 05/09/2019  Consent Given By: the patient  Position: PRONE   Additional Comments: Vital signs were monitored before and after the procedure. Patient was prepped and draped in the usual sterile fashion. The correct patient, procedure, and site was verified.   Injection Procedure Details:  Procedure Site One Meds Administered:  Meds ordered this encounter  Medications  . methylPREDNISolone acetate (DEPO-MEDROL) injection 40 mg     Laterality: Bilateral  Location/Site:  L4-L5  Needle size: 22 guage  Needle type: Spinal  Needle Placement: Articular  Findings:  -Comments: Excellent flow of contrast producing a partial arthrogram.  Procedure Details: The fluoroscope beam is vertically oriented in AP, and the inferior recess is visualized beneath the lower pole of the inferior apophyseal process, which represents the target point for needle insertion. When direct visualization is difficult the target point is located at the medial projection of the vertebral pedicle. The region overlying each aforementioned target is locally anesthetized with a 1 to 2 ml. volume of 1% Lidocaine without Epinephrine.   The spinal needle was inserted into each of the above mentioned facet joints using biplanar fluoroscopic guidance. A 0.25 to 0.5 ml. volume of Isovue-250 was injected and a partial facet joint arthrogram was obtained. A single spot film was obtained of the resulting arthrogram.    One to 1.25 ml of the steroid/anesthetic solution was then injected into each of the facet joints noted above.   Additional Comments:  The patient tolerated the procedure well Dressing: 2 x 2 sterile gauze and Band-Aid    Post-procedure details: Patient was observed  during the procedure. Post-procedure instructions were reviewed.  Patient left the clinic in stable condition.

## 2019-09-05 NOTE — Telephone Encounter (Signed)
Left message #1

## 2019-09-13 ENCOUNTER — Other Ambulatory Visit: Payer: Self-pay | Admitting: Family Medicine

## 2019-09-18 DIAGNOSIS — R350 Frequency of micturition: Secondary | ICD-10-CM | POA: Diagnosis not present

## 2019-09-18 DIAGNOSIS — R3129 Other microscopic hematuria: Secondary | ICD-10-CM | POA: Diagnosis not present

## 2019-09-18 DIAGNOSIS — R3915 Urgency of urination: Secondary | ICD-10-CM | POA: Diagnosis not present

## 2019-09-21 ENCOUNTER — Encounter: Payer: Self-pay | Admitting: Family Medicine

## 2019-09-21 NOTE — Progress Notes (Signed)
Returned faxed request re stopping xarelto for dental tooth extraction to [facility]. OK to stop 1-2 days prior and resume 1-2 days after surgery.

## 2019-10-03 DIAGNOSIS — M6281 Muscle weakness (generalized): Secondary | ICD-10-CM | POA: Diagnosis not present

## 2019-10-03 DIAGNOSIS — M62838 Other muscle spasm: Secondary | ICD-10-CM | POA: Diagnosis not present

## 2019-10-03 DIAGNOSIS — M6289 Other specified disorders of muscle: Secondary | ICD-10-CM | POA: Diagnosis not present

## 2019-10-10 ENCOUNTER — Other Ambulatory Visit: Payer: Self-pay | Admitting: Family Medicine

## 2019-10-10 MED ORDER — OXYCODONE-ACETAMINOPHEN 10-325 MG PO TABS: ORAL_TABLET | ORAL | 0 refills | Status: DC

## 2019-11-06 ENCOUNTER — Other Ambulatory Visit: Payer: Self-pay | Admitting: Family Medicine

## 2019-11-07 ENCOUNTER — Other Ambulatory Visit: Payer: Self-pay | Admitting: Family Medicine

## 2019-11-13 ENCOUNTER — Other Ambulatory Visit: Payer: Self-pay | Admitting: Family Medicine

## 2019-11-30 ENCOUNTER — Other Ambulatory Visit: Payer: Self-pay | Admitting: Family Medicine

## 2019-12-04 ENCOUNTER — Encounter: Payer: Self-pay | Admitting: Family Medicine

## 2019-12-04 ENCOUNTER — Other Ambulatory Visit: Payer: Self-pay

## 2019-12-04 ENCOUNTER — Ambulatory Visit (INDEPENDENT_AMBULATORY_CARE_PROVIDER_SITE_OTHER): Payer: Medicare Other | Admitting: Family Medicine

## 2019-12-04 DIAGNOSIS — M25552 Pain in left hip: Secondary | ICD-10-CM | POA: Diagnosis not present

## 2019-12-04 DIAGNOSIS — M542 Cervicalgia: Secondary | ICD-10-CM

## 2019-12-04 DIAGNOSIS — M549 Dorsalgia, unspecified: Secondary | ICD-10-CM | POA: Diagnosis not present

## 2019-12-04 NOTE — Progress Notes (Signed)
Office Visit Note   Patient: Crystal Garza           Date of Birth: [birth year]           MRN: [id number] Visit Date: 12/04/2019 Requested by: Dickie La, MD [address],  [place] [id number] PCP: Dickie La, MD  Subjective: Chief Complaint  Patient presents with  . Neck - Pain    Fell 8 days ago while getting out of the bed - hitting left lateral hip against the bed. Pain in that area since then. Pain in neck and middle back worsened after this fall (chronic issue for her).  . Middle Back - Pain  . Left Hip - Pain    HPI: She is here with neck and mid back pain, as well as left hip pain.  Ongoing pain in her neck and back.  Injections in the cervical spine did not help.  Then recently she fell getting out of bed and landed on her left side.  If it aggravated her neck and back pain and she injured her left lateral hip.  Pain does not seem to be improving.               ROS:   All other systems were reviewed and are negative.  Objective: Vital Signs: There were no vitals taken for this visit.  Physical Exam:  General:  Alert and oriented, in no acute distress. Pulm:  Breathing unlabored. Psy:  Normal mood, congruent affect. Skin: No visible bruising. Neck: She has some tenderness to palpation in the lower cervical spinous processes and paraspinous muscles.  Also tender in the upper to mid thoracic paraspinous muscles. Left hip: Tender over the greater trochanter.  Range of motion is 45 degrees of internal rotation without groin pain.  Imaging: None today  Assessment & Plan: 1.  Recently status post fall with neck, thoracic back, and left hip contusions -Referral to physical therapy at [facility] -Follow-up as needed as long as she makes progress.     Procedures: No procedures performed  No notes on file     PMFS History: Patient Active Problem List   Diagnosis Date Noted  . Cervical radiculopathy at C6 03/29/2019  . Pain, dental  10/07/2018  . Otalgia 09/21/2018  . 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 History of pulmonary embolism 05/25/2017  . Angina, class III ([place]) 05/25/2017  . Symptoms of gastroesophageal reflux 11/09/2016  . Esophageal dysphagia   . Nausea without vomiting   . Family history of colon cancer   . Dizziness 11/08/2015  . 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 Chronic neck and back pain 02/27/2015  . Other chest pain 12/10/2014  . Medication management 08/22/2014  . Hematuria 08/22/2014  . Pain in joint, ankle and foot 05/23/2014  . Chronic anticoagulation 05/17/2014  . Chronic thoracic back pain 04/11/2014  . Right flank pain 01/17/2014  . Degenerative arthritis of left knee 10/06/2013  . Status post total knee replacement 10/06/2013  . Edema 07/28/2013  . Chronic pain 06/13/2013  . 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 Nephrolithiasis 02/20/2011  . Eczematous dermatitis 10/22/2010  . Iron deficiency anemia 06/27/2010  . Anxiety 06/27/2010  . Migraine 09/12/2009  . Morbid obesity-BMI 62 10/30/2008  . Osteoarthrosis, unspecified whether generalized or localized, involving lower leg 07/05/2007  . Major depressive disorder, recurrent episode ([place]) 07/01/2006  . HYPERTENSION,  BENIGN SYSTEMIC 07/01/2006  . RHINITIS, ALLERGIC 07/01/2006  . Gastroesophageal reflux disease 07/01/2006   Past Medical History:  Diagnosis Date  . Anemia    iron def  . Anginal pain ([place])   . Anxiety   . Arthritis    left knee   . Back pain   . Benign gastrointestinal stromal tumor (GIST)   . Chronic knee pain   . Depression   . DVT (deep venous thrombosis) ([place])   . GERD (gastroesophageal reflux disease)   . History of kidney stones   . Hx of laparoscopic gastric banding   . Hypertension   . Migraine   . Morbid obesity ([place])   . PE (pulmonary embolism)     Family History  Problem Relation Age of Onset  .  Diabetes Mother   . Diabetes Father   . Heart disease Father   . Colon cancer Father        brain tumor  . Colon polyps Father   . Colitis Father   . Irritable bowel syndrome Father   . Diabetes Maternal Grandmother   . Diabetes Maternal Grandfather   . Schizophrenia Maternal Grandfather   . Heart disease Paternal Uncle   . Diabetes Paternal Uncle   . Heart disease Paternal Grandmother   . Heart disease Paternal Grandfather   . Diabetes Maternal Aunt   . Diabetes Maternal Uncle   . Diabetes Paternal Aunt   . Cancer Other   . Hypertension Other   . Esophageal cancer Neg Hx     Past Surgical History:  Procedure Laterality Date  . [place] STUDY N/A 05/13/2016   Procedure: [place] STUDY;  Surgeon: Manus Gunning, MD;  [facility];  Service: Gastroenterology;  Laterality: N/A;  . ABDOMINAL HYSTERECTOMY    . BIOPSY  02/16/2012   Procedure: BIOPSY;  Surgeon: [name], MD;  [facility]: [facility];  Service: General;;  biopsy of mass x 2  . BREATH TEK H PYLORI  07/28/2011   Procedure: [place];  Surgeon: [name], MD;  Location: [facility];  Service: General;  Laterality: N/A;  to be done at 745  . CESAREAN SECTION  05/1991  . COLONOSCOPY WITH PROPOFOL N/A 03/03/2016   Procedure: COLONOSCOPY WITH PROPOFOL;  Surgeon: Manus Gunning, MD;  [facility];  Service: Gastroenterology;  Laterality: N/A;  . ESOPHAGEAL MANOMETRY N/A 05/13/2016   Procedure: ESOPHAGEAL MANOMETRY (EM);  Surgeon: Manus Gunning, MD;  [facility];  Service: Gastroenterology;  Laterality: N/A;  . ESOPHAGOGASTRODUODENOSCOPY N/A 08/29/2012   Procedure: ESOPHAGOGASTRODUODENOSCOPY (EGD);  Surgeon: [name], MD;  [facility];  Service: General;  Laterality: N/A;  . ESOPHAGOGASTRODUODENOSCOPY N/A 09/04/2014   Procedure: ESOPHAGOGASTRODUODENOSCOPY (EGD);  Surgeon: Alphonsa Overall, MD;  Location: [facility];  Service: General;   Laterality: N/A;  . ESOPHAGOGASTRODUODENOSCOPY (EGD) WITH PROPOFOL N/A 03/03/2016   Procedure: ESOPHAGOGASTRODUODENOSCOPY (EGD) WITH PROPOFOL;  Surgeon: Manus Gunning, MD;  [facility];  Service: Gastroenterology;  Laterality: N/A;  . EUS  03/03/2012   Procedure: UPPER ENDOSCOPIC ULTRASOUND (EUS) LINEAR;  Surgeon: Milus Banister, MD;  [facility];  Service: Endoscopy;  Laterality: N/A;  . GASTRIC ROUX-EN-Y N/A 12/07/2017   Procedure: LAPAROSCOPIC ROUX-EN-Y GASTRIC BYPASS WITH LYSIS OF ADHESIONS AND UPPER ENDOSCOPY ERAS Pathway;  Surgeon: Johnathan Hausen, MD;  [facility];  Service: General;  Laterality: N/A;  . gist    . KNEE ARTHROSCOPY    . LAPAROSCOPIC  GASTRECTOMY  04/08/2012   Procedure: LAPAROSCOPIC GASTRECTOMY;  Surgeon: [name], MD;  [facility];  Service: General;;  removal of GIST tumor of stomach  . LAPAROSCOPIC GASTRIC SLEEVE RESECTION N/A 08/29/2012   Procedure: LAPAROSCOPIC GASTRIC SLEEVE RESECTION;  Surgeon: [name], MD;  [facility];  Service: General;  Laterality: N/A;  Sleeve Gastrectomy  . LEFT HEART CATH AND CORONARY ANGIOGRAPHY N/A 05/28/2017   Procedure: LEFT HEART CATH AND CORONARY ANGIOGRAPHY;  Surgeon: Leonie Man, MD;  Location: [facility];  Service: Cardiovascular;  Laterality: N/A;  . ROUX-EN-Y GASTRIC BYPASS    . TOTAL KNEE ARTHROPLASTY Left 10/06/2013   Procedure: LEFT TOTAL KNEE ARTHROPLASTY;  Surgeon: Mcarthur Rossetti, MD;  [facility];  Service: Orthopedics;  Laterality: Left;  . TUBAL LIGATION     Social History   Occupational History  . Not on file  Tobacco Use  . Smoking status: Never Smoker  . Smokeless tobacco: Never Used  Vaping Use  . Vaping Use: Never used  Substance and Sexual Activity  . Alcohol use: No    Alcohol/week: 0.0 standard drinks  . Drug use: No  . Sexual activity: Not on file

## 2019-12-08 ENCOUNTER — Ambulatory Visit (INDEPENDENT_AMBULATORY_CARE_PROVIDER_SITE_OTHER): Payer: Medicare Other | Admitting: Family Medicine

## 2019-12-08 ENCOUNTER — Other Ambulatory Visit: Payer: Self-pay

## 2019-12-08 VITALS — BP 131/84 | Ht 67.0 in | Wt 290.0 lb

## 2019-12-08 DIAGNOSIS — G43909 Migraine, unspecified, not intractable, without status migrainosus: Secondary | ICD-10-CM

## 2019-12-08 DIAGNOSIS — F419 Anxiety disorder, unspecified: Secondary | ICD-10-CM | POA: Diagnosis not present

## 2019-12-08 DIAGNOSIS — M25559 Pain in unspecified hip: Secondary | ICD-10-CM | POA: Diagnosis not present

## 2019-12-08 MED ORDER — METHYLPREDNISOLONE ACETATE 40 MG/ML IJ SUSP
40.0000 mg | Freq: Once | INTRAMUSCULAR | Status: AC
  Administered 2019-12-08: 40 mg via INTRA_ARTICULAR

## 2019-12-08 MED ORDER — ALPRAZOLAM 1 MG PO TABS: ORAL_TABLET | ORAL | 5 refills | Status: DC

## 2019-12-08 NOTE — Assessment & Plan Note (Addendum)
Significant increase in symptoms, probably related to social stressors.  The stressors unfortunately do not have prognosis to improve in the near future.  We will increase her antianxiety medicine and follow-up 6 to 8 weeks.  She has been relatively stable on this medicine without any issues for quite some time.  No concerns for abuse or misuse.

## 2019-12-08 NOTE — Progress Notes (Signed)
  Crystal Garza - 49 y.o. female MRN [id number]  Date of birth: 1971/03/12    SUBJECTIVE:      Chief Complaint:/ HPI:  Left hip pain.  [place] out of bed week and a half ago.  Had significant pain immediately and has not improved much since.  Pain is chronic 8 out of 10.  Worse if she tries to lay on that side.  Worse if she tries to climb stairs.  No weakness in the leg and she is not had any subsequent falls. Past medical history significant for chronic anticoagulation.  #2.  Anxiety is increased.  Due to social stressors at home and at work.  Wants to increase her chronic antianxiety medicines if at all possible..    #3.  She is having more frequent chronic headaches and she thinks this is related to her anxiety.  Continues to work full-time.  Headaches are typical for her just occurring 3-5 times a week now instead of once or twice a week.  No new symptoms associated with them.   OBJECTIVE: BP 131/84   Ht 5\' 7"  (1.702 m)   Wt 290 lb (131.5 kg)   BMI 45.42 kg/m   Physical Exam:  Vital signs are reviewed. GENERAL: Well-developed overweight female no acute distress Hips: Full range of motion internal and external rotation with normal range.  She is exquisitely tender over the left greater trochanteric bursa and this reproduces her pain to some extent.  He has normal flexion extension of bilateral hips with normal strength. PSYCH: AxOx4. Good eye contact.. No psychomotor retardation or agitation. Appropriate speech fluency and content. Asks and answers questions appropriately. Mood is congruent.   PROCEDURE: INJECTION: Patient was given informed consent, signed copy in the chart. Appropriate time out was taken. Area prepped and draped in usual sterile fashion. Ethyl chloride was  used for local anesthesia. A 21 gauge 1 1/2 inch needle was used.. 1 cc of methylprednisolone 40 mg/ml plus 2 cc of 1% lidocaine mixed 50-50 with 2 cc bupivacaine without epinephrine was injected into the hip greater  trochanteric bursa using a(n) lateral approach.  Spinal needle used for injection.  The patient tolerated the procedure well. There were no complications. Post procedure instructions were given.   ASSESSMENT & PLAN: Left hip pain: Greater trochanteric bursitis, acute.  Corticosteroid injection as above.  Should improve over the next 2 weeks.  If she is not improving, let me know. See problem based charting & AVS for pt instructions. Anxiety Significant increase in symptoms, probably related to social stressors.  The stressors unfortunately do not have prognosis to improve in the near future.  We will increase her antianxiety medicine and follow-up 6 to 8 weeks.  She has been relatively stable on this medicine without any issues for quite some time.  No concerns for abuse or misuse.  Migraine Chronic headaches mix of tension and migraine.  Continue chronic pain medicine.

## 2019-12-08 NOTE — Assessment & Plan Note (Signed)
Chronic headaches mix of tension and migraine.  Continue chronic pain medicine.

## 2019-12-10 ENCOUNTER — Other Ambulatory Visit: Payer: Self-pay | Admitting: Family Medicine

## 2019-12-11 MED ORDER — OXYCODONE-ACETAMINOPHEN 10-325 MG PO TABS: ORAL_TABLET | ORAL | 0 refills | Status: DC

## 2019-12-18 DIAGNOSIS — M25652 Stiffness of left hip, not elsewhere classified: Secondary | ICD-10-CM | POA: Diagnosis not present

## 2019-12-18 DIAGNOSIS — M542 Cervicalgia: Secondary | ICD-10-CM | POA: Diagnosis not present

## 2019-12-18 DIAGNOSIS — M25552 Pain in left hip: Secondary | ICD-10-CM | POA: Diagnosis not present

## 2019-12-18 DIAGNOSIS — R269 Unspecified abnormalities of gait and mobility: Secondary | ICD-10-CM | POA: Diagnosis not present

## 2019-12-18 DIAGNOSIS — M62552 Muscle wasting and atrophy, not elsewhere classified, left thigh: Secondary | ICD-10-CM | POA: Diagnosis not present

## 2020-01-10 ENCOUNTER — Other Ambulatory Visit: Payer: Self-pay | Admitting: Family Medicine

## 2020-01-10 MED ORDER — OXYCODONE-ACETAMINOPHEN 10-325 MG PO TABS: ORAL_TABLET | ORAL | 0 refills | Status: DC

## 2020-01-31 ENCOUNTER — Other Ambulatory Visit: Payer: Self-pay | Admitting: Family Medicine

## 2020-01-31 MED ORDER — AMOXICILLIN 500 MG PO CAPS
ORAL_CAPSULE | ORAL | 0 refills | Status: DC
Start: 2020-01-31 — End: 2020-05-16

## 2020-02-05 ENCOUNTER — Other Ambulatory Visit: Payer: Self-pay

## 2020-02-05 MED ORDER — OXYCODONE-ACETAMINOPHEN 10-325 MG PO TABS: ORAL_TABLET | ORAL | 0 refills | Status: DC

## 2020-02-12 ENCOUNTER — Other Ambulatory Visit: Payer: Self-pay | Admitting: Family Medicine

## 2020-02-27 ENCOUNTER — Ambulatory Visit: Payer: Medicare Other | Admitting: Gastroenterology

## 2020-03-01 ENCOUNTER — Other Ambulatory Visit: Payer: Self-pay

## 2020-03-01 ENCOUNTER — Encounter: Payer: Self-pay | Admitting: *Deleted

## 2020-03-01 MED ORDER — OXYCODONE-ACETAMINOPHEN 10-325 MG PO TABS: ORAL_TABLET | ORAL | 0 refills | Status: DC

## 2020-03-05 ENCOUNTER — Ambulatory Visit (INDEPENDENT_AMBULATORY_CARE_PROVIDER_SITE_OTHER): Payer: Medicare Other | Admitting: Gastroenterology

## 2020-03-05 ENCOUNTER — Encounter: Payer: Self-pay | Admitting: Gastroenterology

## 2020-03-05 VITALS — BP 124/70 | HR 74 | Ht 67.0 in | Wt 290.8 lb

## 2020-03-05 DIAGNOSIS — Z800 Family history of malignant neoplasm of digestive organs: Secondary | ICD-10-CM

## 2020-03-05 DIAGNOSIS — K5909 Other constipation: Secondary | ICD-10-CM

## 2020-03-05 DIAGNOSIS — F1190 Opioid use, unspecified, uncomplicated: Secondary | ICD-10-CM

## 2020-03-05 DIAGNOSIS — K219 Gastro-esophageal reflux disease without esophagitis: Secondary | ICD-10-CM

## 2020-03-05 NOTE — Progress Notes (Signed)
HPI :  49 year old female here for a follow-up visit.  I know her from history of dysphagia and reflux for which she has undergone an extensive evaluation as outlined below.  She is here today for complaints of chronic constipation.  She has tried a few regimens for constipation over the past year or so.  She takes Percocet 3-4 times a day chronically, 10-325 mg.  She states if she does not take anything she has difficult time going the bathroom, will have a bowel movement every 3 to 4 days.  She was using MiraLAX twice daily and it has not really helped her.  She notes if she goes out to a restaurant if she has not had a bowel movement in a while, she can have some postprandial loose stools with urgency and has had a few episodes of fecal incontinence due to this.  She was given a trial of Linzess 72 mcg in the past and states it did not really help too much either.  She does not endorse any routine blood in the stool, occasionally had a scant amount.  Her father had colon cancer at age 27.  She had a colonoscopy with me in 02/2016, she did not have any polyps noted at that time.  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  We discussed options to manage this as outlined below.  Otherwise she takes Dexilant 60 mg a day for reflux symptoms.  She states this works better than anything else she has taken in the past and is generally controls her symptoms quite well.  She is undergone extensive evaluation for this and dysphagia in the past include EGD, pH/manometry testing.  We have discussed options in the past and she has preferred to continue [place].   Prior workup: EGD 2017 showedmildesophagitis at the GEJ, 2cm hiatal hernia. with evidence of gastric sleeve surgery. Biopsies negative for H pylori.Shetransitioned to [employer] 60mg  daily. She has been on Dexilant since then.  Results of manometry and 24 Hr pH impedance studies Esophageal manometry  - quality of study is decreased  by double swallows - normal resting LES pressure with complete relaxation - 100% of swallows had peristaltic contractions, but 50% of swallows had large breaks in peristalsis  Overall, suboptimal study due to double swallows however fragmented peristalsis noted with some poor bolus clearance, but no other major abnormality, no achalasia  24 HR pH impedance study: Study performed ON PPI (Dexilant)  Results: - Study time of 24 hrs: adequate study time - Demeester score of 5.5 (14.72 = upper limit of normal) -no evidence of pathologic reflux - Symptom index of only 33% (3/9) for acid reflux episodes, and 44% (4/9) for all reflux episodes  - most reflux episodes that occurred were weakly acidic (32/42)  Summary and Interpretation: - study consistent with controlled acid reflux disease on PPI - symptoms reported didNOTcorrelate well with refluxepisodes - there could be a component of functional heartburn to symptoms in addition to nonspecific changes noted on manometry  Colonoscopy10/31/2017 -normal exam, no polyps, recommended a repeat colonoscopy in 5 years given strong FH of colon cancer.     Past Medical History:  Diagnosis Date  . Anemia    iron def  . Anginal pain ([place])   . Anxiety   . Arthritis    left knee   . Back pain   . Benign gastrointestinal stromal tumor (GIST)   . Chronic knee pain   . Depression   . DVT (deep  venous thrombosis) ([place])   . Esophageal dysmotility   . GERD (gastroesophageal reflux disease)   . Hiatal hernia   . History of kidney stones   . Hx of laparoscopic gastric banding   . Hypertension   . Internal hemorrhoids   . Internal hemorrhoids   . Migraine   . Morbid obesity ([place])   . PE (pulmonary embolism)      Past Surgical History:  Procedure Laterality Date  . [place] STUDY N/A 05/13/2016   Procedure: [place] STUDY;  Surgeon: Manus Gunning, MD;  [facility];  Service: Gastroenterology;  Laterality:  N/A;  . ABDOMINAL HYSTERECTOMY    . BIOPSY  02/16/2012   Procedure: BIOPSY;  Surgeon: [name], MD;  [facility];  Service: General;;  biopsy of mass x 2  . BREATH TEK H PYLORI  07/28/2011   Procedure: [place];  Surgeon: [name], MD;  Location: [facility];  Service: General;  Laterality: N/A;  to be done at 745  . CESAREAN SECTION  05/1991  . COLONOSCOPY WITH PROPOFOL N/A 03/03/2016   Procedure: COLONOSCOPY WITH PROPOFOL;  Surgeon: Manus Gunning, MD;  [facility];  Service: Gastroenterology;  Laterality: N/A;  . ESOPHAGEAL MANOMETRY N/A 05/13/2016   Procedure: ESOPHAGEAL MANOMETRY (EM);  Surgeon: Manus Gunning, MD;  [facility];  Service: Gastroenterology;  Laterality: N/A;  . ESOPHAGOGASTRODUODENOSCOPY N/A 08/29/2012   Procedure: ESOPHAGOGASTRODUODENOSCOPY (EGD);  Surgeon: [name], MD;  [facility];  Service: General;  Laterality: N/A;  . ESOPHAGOGASTRODUODENOSCOPY N/A 09/04/2014   Procedure: ESOPHAGOGASTRODUODENOSCOPY (EGD);  Surgeon: Alphonsa Overall, MD;  Location: [facility];  Service: General;  Laterality: N/A;  . ESOPHAGOGASTRODUODENOSCOPY (EGD) WITH PROPOFOL N/A 03/03/2016   Procedure: ESOPHAGOGASTRODUODENOSCOPY (EGD) WITH PROPOFOL;  Surgeon: Manus Gunning, MD;  [facility];  Service: Gastroenterology;  Laterality: N/A;  . EUS  03/03/2012   Procedure: UPPER ENDOSCOPIC ULTRASOUND (EUS) LINEAR;  Surgeon: Milus Banister, MD;  [facility];  Service: Endoscopy;  Laterality: N/A;  . GASTRIC ROUX-EN-Y N/A 12/07/2017   Procedure: LAPAROSCOPIC ROUX-EN-Y GASTRIC BYPASS WITH LYSIS OF ADHESIONS AND UPPER ENDOSCOPY ERAS Pathway;  Surgeon: Johnathan Hausen, MD;  [facility];  Service: General;  Laterality: N/A;  . gist    . KNEE ARTHROSCOPY    . LAPAROSCOPIC GASTRECTOMY  04/08/2012   Procedure: LAPAROSCOPIC GASTRECTOMY;  Surgeon: [name], MD;  [facility];  Service:  General;;  removal of GIST tumor of stomach  . LAPAROSCOPIC GASTRIC SLEEVE RESECTION N/A 08/29/2012   Procedure: LAPAROSCOPIC GASTRIC SLEEVE RESECTION;  Surgeon: [name], MD;  [facility];  Service: General;  Laterality: N/A;  Sleeve Gastrectomy  . LEFT HEART CATH AND CORONARY ANGIOGRAPHY N/A 05/28/2017   Procedure: LEFT HEART CATH AND CORONARY ANGIOGRAPHY;  Surgeon: Leonie Man, MD;  Location: [facility];  Service: Cardiovascular;  Laterality: N/A;  . ROUX-EN-Y GASTRIC BYPASS    . TOTAL KNEE ARTHROPLASTY Left 10/06/2013   Procedure: LEFT TOTAL KNEE ARTHROPLASTY;  Surgeon: Mcarthur Rossetti, MD;  [facility];  Service: Orthopedics;  Laterality: Left;  . TUBAL LIGATION     Family History  Problem Relation Age of Onset  . Diabetes Mother   . Diabetes Father   . Heart disease Father   . Colon cancer Father        brain tumor  . Colon polyps Father   . Colitis Father   .  Irritable bowel syndrome Father   . Diabetes Maternal Grandmother   . Diabetes Maternal Grandfather   . Schizophrenia Maternal Grandfather   . Heart disease Paternal Uncle   . Diabetes Paternal Uncle   . Heart disease Paternal Grandmother   . Heart disease Paternal Grandfather   . Diabetes Maternal Aunt   . Diabetes Maternal Uncle   . Diabetes Paternal Aunt   . Cancer Other   . Hypertension Other   . Esophageal cancer Neg Hx    Social History   Tobacco Use  . Smoking status: Never Smoker  . Smokeless tobacco: Never Used  Vaping Use  . Vaping Use: Never used  Substance Use Topics  . Alcohol use: No    Alcohol/week: 0.0 standard drinks  . Drug use: No   Current Outpatient Medications  Medication Sig Dispense Refill  . ALPRAZolam (XANAX) 1 MG tablet Take by mouth one tablet three times a day as needed for anxiety 90 tablet 5  . amoxicillin (AMOXIL) 500 MG capsule Take one by mouth three times a day 21 capsule 0  . DEXILANT 60 MG capsule TAKE (1) CAPSULE DAILY. 90 capsule 1    . dexlansoprazole (DEXILANT) 60 MG capsule Take 1 capsule (60 mg total) by mouth daily. 90 capsule 3  . DULoxetine (CYMBALTA) 20 MG capsule Take one by mouth daily 90 capsule 3  . lamoTRIgine (LAMICTAL) 200 MG tablet Take 1 tablet (200 mg total) by mouth at bedtime. 90 tablet 3  . lisinopril (ZESTRIL) 10 MG tablet TAKE 1 TABLET EACH DAY. 90 tablet 3  . ondansetron (ZOFRAN-ODT) 4 MG disintegrating tablet Take 1 tablet (4 mg total) by mouth 2 (two) times daily as needed for nausea or vomiting. 60 tablet 3  . oxyCODONE-acetaminophen (PERCOCET) 10-325 MG tablet TAKE 1 TABLET EVERY 6 HOURS AS NEEDED FOR CHRONIC PAIN. 120 tablet 0  . risperiDONE (RISPERDAL) 1 MG tablet Take 1 tablet (1 mg total) by mouth at bedtime. 90 tablet 3  . solifenacin (VESICARE) 5 MG tablet Take 5 mg by mouth daily.    Alveda Reasons 10 MG TABS tablet TAKE 1 TABLET ONCE DAILY. 90 tablet 3   No current facility-administered medications for this visit.   Allergies  Allergen Reactions  . Codeine Rash  . Geralyn Flash [Fish Allergy] Rash     Review of Systems: All systems reviewed and negative except where noted in HPI.   Lab Results  Component Value Date   WBC 10.9 (H) 02/22/2019   HGB 13.6 02/22/2019   HCT 45.4 02/22/2019   MCV 89.7 02/22/2019   PLT 413 (H) 02/22/2019    Lab Results  Component Value Date   CREATININE 0.81 02/04/2018   BUN 5 (L) 02/04/2018   NA 142 02/04/2018   K 3.0 (L) 02/04/2018   CL 106 02/04/2018   CO2 25 02/04/2018    Lab Results  Component Value Date   ALT 14 12/07/2017   AST 29 12/07/2017   ALKPHOS 61 12/07/2017   BILITOT 1.1 12/07/2017     Physical Exam: BP 124/70 (BP Location: Left Wrist, Patient Position: Sitting)   Pulse 74   Ht 5\' 7"  (1.702 m)   Wt 290 lb 12.8 oz (131.9 kg)   SpO2 98%   BMI 45.55 kg/m  Constitutional: Pleasant,well-developed, female in no acute distress. Neurological: Alert and oriented to person place and time. Psychiatric: Normal mood and affect. Behavior  is normal.   ASSESSMENT AND PLAN: 49 year old female here for reassessment the following:  Constipation / chronic narcotic use - this is not being well managed at this time, she has had some what sounds like episodes of overflow incontinence when out eating at restaurants.  She otherwise goes days without a bowel movement.  MiraLAX has not worked well, low-dose Linzess did not help much.  I discussed options with her.  She does take chronic narcotics and could benefit from a trial of Movantik or Relistor etc.  I discussed this option with her and she want to hold off on that for now.  She was interested in trying a higher dose of Linzess initially or consideration for another regimen in that class.  I have samples of Linzess 145 mcg as well as to 90 mcg.  I suggest she tries 145 mcg for 3 to 4 days and see how she does, she can increase it to 90 mcg if needed.  I would like to hear from her in a few weeks to let me know how she is doing and I can place a prescription for her for whatever regimen she finds best.  If no benefit at all, I would encourage her to try Movantik or something in that class.  Family history of colon cancer - she is due for colonoscopy October 2022.  I reviewed findings of her last exam with her which did not show any polyps at all.  I reassured her that it is very unlikely that she has a problem such as a polyp or mass in her colon causing her symptoms.  She is a bit anxious about this.  To put her peace of mind I offered her a colonoscopy sooner.  She wants to see how she does on the Linzess.  If she continues to have symptoms despite multiple regimens and is not getting better she would want to proceed with colonoscopy sooner.  I will wait to hear from her.  GERD - work-up as outlined above.  She strongly wishes to continue Dexilant as that controls her symptoms quite well.  She can follow-up as needed for this issue.  Tetonia Cellar, MD [facility]

## 2020-03-05 NOTE — Patient Instructions (Signed)
We have given you samples of the following medication to take: Linzess 145 mcg once daily-- If this is ineffective, please take the Linzess 290 mcg samples provided.  Call our office with an update on which medication dosage worked best for you and we will send a prescription to your pharmacy.  If you are age 51 or younger, your body mass index should be between 19-25. Your Body mass index is 45.55 kg/m. If this is out of the aformentioned range listed, please consider follow up with your Primary Care Provider.   Due to recent changes in healthcare laws, you may see the results of your imaging and laboratory studies on MyChart before your provider has had a chance to review them.  We understand that in some cases there may be results that are confusing or concerning to you. Not all laboratory results come back in the same time frame and the provider may be waiting for multiple results in order to interpret others.  Please give [name] 48 hours in order for your provider to thoroughly review all the results before contacting the office for clarification of your results.

## 2020-03-06 ENCOUNTER — Encounter: Payer: Self-pay | Admitting: Family Medicine

## 2020-03-06 DIAGNOSIS — K5903 Drug induced constipation: Secondary | ICD-10-CM | POA: Insufficient documentation

## 2020-03-11 ENCOUNTER — Ambulatory Visit: Payer: Medicare Other | Admitting: Family Medicine

## 2020-03-11 NOTE — Progress Notes (Deleted)
    SUBJECTIVE:   CHIEF COMPLAINT / HPI:   First COVID Vaccine ***  PERTINENT  PMH / PSH: ***  OBJECTIVE:   There were no vitals taken for this visit.   Physical Exam: *** General: 49 y.o. female in NAD Cardio: RRR no m/r/g Lungs: CTAB, no wheezing, no rhonchi, no crackles, no IWOB on *** Abdomen: Soft, non-tender to palpation, non-distended, positive bowel sounds Skin: warm and dry Extremities: No edema   ASSESSMENT/PLAN:   No problem-specific Assessment & Plan notes found for this encounter.     Cleophas Dunker, [place]

## 2020-03-18 ENCOUNTER — Other Ambulatory Visit: Payer: Self-pay

## 2020-03-18 ENCOUNTER — Telehealth: Payer: Self-pay | Admitting: Gastroenterology

## 2020-03-18 MED ORDER — LINACLOTIDE 145 MCG PO CAPS: 145.0000 ug | ORAL_CAPSULE | Freq: Every day | ORAL | 2 refills | Status: DC

## 2020-03-18 NOTE — Telephone Encounter (Signed)
Refill sent to pharmacy.   

## 2020-03-18 NOTE — Telephone Encounter (Signed)
Refill of Linzess 145 mcg sent to pharmacy.

## 2020-03-18 NOTE — Telephone Encounter (Signed)
Pt is requesting a prescription for her Linzess, [place]

## 2020-03-27 ENCOUNTER — Ambulatory Visit: Payer: Medicare Other

## 2020-04-01 ENCOUNTER — Other Ambulatory Visit: Payer: Self-pay

## 2020-04-03 ENCOUNTER — Telehealth: Payer: Self-pay

## 2020-04-03 NOTE — Telephone Encounter (Signed)
Patient calls nurse line to check status of rx refill on oxycodone-acetaminophen. Patient requested via myChart in Sports Medicine encounter on 11/29.   Please advise if rx can be refilled.   Talbot Grumbling, RN

## 2020-04-04 ENCOUNTER — Other Ambulatory Visit: Payer: Self-pay | Admitting: Family Medicine

## 2020-04-04 MED ORDER — OXYCODONE-ACETAMINOPHEN 10-325 MG PO TABS: ORAL_TABLET | ORAL | 0 refills | Status: DC

## 2020-04-29 ENCOUNTER — Other Ambulatory Visit: Payer: Self-pay | Admitting: Family Medicine

## 2020-05-07 DIAGNOSIS — Z20822 Contact with and (suspected) exposure to covid-19: Secondary | ICD-10-CM | POA: Diagnosis not present

## 2020-05-13 ENCOUNTER — Other Ambulatory Visit: Payer: Self-pay | Admitting: Gastroenterology

## 2020-05-16 ENCOUNTER — Other Ambulatory Visit: Payer: Self-pay | Admitting: Family Medicine

## 2020-05-16 MED ORDER — AMOXICILLIN 500 MG PO CAPS
ORAL_CAPSULE | ORAL | 0 refills | Status: DC
Start: 2020-05-16 — End: 2020-06-03

## 2020-05-27 ENCOUNTER — Other Ambulatory Visit: Payer: Self-pay | Admitting: Family Medicine

## 2020-05-29 ENCOUNTER — Other Ambulatory Visit: Payer: Self-pay | Admitting: Family Medicine

## 2020-06-03 ENCOUNTER — Encounter: Payer: Self-pay | Admitting: Family Medicine

## 2020-06-03 ENCOUNTER — Ambulatory Visit (INDEPENDENT_AMBULATORY_CARE_PROVIDER_SITE_OTHER): Payer: Medicare Other | Admitting: Family Medicine

## 2020-06-03 ENCOUNTER — Other Ambulatory Visit: Payer: Self-pay

## 2020-06-03 DIAGNOSIS — R200 Anesthesia of skin: Secondary | ICD-10-CM

## 2020-06-03 DIAGNOSIS — R202 Paresthesia of skin: Secondary | ICD-10-CM

## 2020-06-03 DIAGNOSIS — M79641 Pain in right hand: Secondary | ICD-10-CM

## 2020-06-03 NOTE — Progress Notes (Signed)
Office Visit Note   Patient: Crystal Garza           Date of Birth: May 11, 1970           MRN: [id number] Visit Date: 06/03/2020 Requested by: Dickie La, MD [address],   [id number] PCP: Dickie La, MD  Subjective: Chief Complaint  Patient presents with  . Left Hand - Numbness, Pain    Numbness in left hand, worse over the last 30 days - constant numbness in fingers II-!V. Feels pain up the arm. Right-hand dominant.  . Right Hand - Pain    Pain and swelling in the 1st MC area, x 2-3 months. NKI    HPI: She is here with left hand numbness and right hand pain.  Symptoms have been present in the left hand for a long time, she was diagnosed with carpal tunnel syndrome in the past.  Her symptoms were manageable until about a month ago when her second, third and fourth fingers became constantly numb.  Pain radiates part way up the arm.  She is right-hand dominant.  Her right hand has been hurting for about 2 or 3 months at the second MCP joint.  She has tried Voltaren gel with some relief.                ROS:   All other systems were reviewed and are negative.  Objective: Vital Signs: There were no vitals taken for this visit.  Physical Exam:  General:  Alert and oriented, in no acute distress. Pulm:  Breathing unlabored. Psy:  Normal mood, congruent affect. Skin: No erythema Right hand: She is tender to palpation at the index MCP joint.  She has good range of motion and no joint effusion. Left hand: She has positive Tinel's at the carpal tunnel, positive Phalen's test.  No thenar atrophy, good intrinsic hand strength.    Imaging: No results found.  Assessment & Plan: 1.  Left hand numbness, probably worsening carpal tunnel syndrome -Discussed with her and elected to inject the carpal tunnel today.  Repeat nerve studies if symptoms persist.  2.  Right hand index MCP pain, suspect arthritis -She will try to find a neoprene hand glove to wear for  comfort.     Procedures: Left wrist injection: After sterile prep with Betadine, injected 1 cc 0.25% bupivacaine and 6 mg betamethasone into the carpal tunnel.       PMFS History: Patient Active Problem List   Diagnosis Date Noted  . Constipation due to pain medication 03/06/2020  . Cervical radiculopathy at C6 03/29/2019  . 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 History of pulmonary embolism 05/25/2017  . Esophageal dysphagia   . Family history of colon cancer   . 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 Chronic neck and back pain 02/27/2015  . Medication management 08/22/2014  . Chronic anticoagulation 05/17/2014  . Degenerative arthritis of left knee 10/06/2013  . Status post total knee replacement 10/06/2013  . Chronic pain 06/13/2013  . 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 Nephrolithiasis 02/20/2011  . Iron deficiency anemia 06/27/2010  . Anxiety 06/27/2010  . Migraine 09/12/2009  . Morbid obesity-BMI 62 10/30/2008  . Major depressive disorder, recurrent episode ([place]) 07/01/2006  . HYPERTENSION, BENIGN SYSTEMIC 07/01/2006  . RHINITIS, ALLERGIC 07/01/2006  . Gastroesophageal reflux disease 07/01/2006   Past Medical History:  Diagnosis Date  .  Anemia    iron def  . Anginal pain ([place])   . Anxiety   . Arthritis    left knee   . Back pain   . Benign gastrointestinal stromal tumor (GIST)   . Chronic knee pain   . Depression   . DVT (deep venous thrombosis) ([place])   . Esophageal dysmotility   . GERD (gastroesophageal reflux disease)   . Hiatal hernia   . History of kidney stones   . Hx of laparoscopic gastric banding   . Hypertension   . Internal hemorrhoids   . Internal hemorrhoids   . Migraine   . Morbid obesity ([place])   . PE (pulmonary embolism)     Family History  Problem Relation Age of Onset  . Diabetes Mother   . Diabetes Father   . Heart disease Father   .  Colon cancer Father        brain tumor  . Colon polyps Father   . Colitis Father   . Irritable bowel syndrome Father   . Diabetes Maternal Grandmother   . Diabetes Maternal Grandfather   . Schizophrenia Maternal Grandfather   . Heart disease Paternal Uncle   . Diabetes Paternal Uncle   . Heart disease Paternal Grandmother   . Heart disease Paternal Grandfather   . Diabetes Maternal Aunt   . Diabetes Maternal Uncle   . Diabetes Paternal Aunt   . Cancer Other   . Hypertension Other   . Esophageal cancer Neg Hx     Past Surgical History:  Procedure Laterality Date  . [place] STUDY N/A 05/13/2016   Procedure: [place] STUDY;  Surgeon: Manus Gunning, MD;  Location: [facility];  Service: Gastroenterology;  Laterality: N/A;  . ABDOMINAL HYSTERECTOMY    . BIOPSY  02/16/2012   Procedure: BIOPSY;  Surgeon: [name], MD;  Location: [facility];  Service: General;;  biopsy of mass x 2  . BREATH TEK H PYLORI  07/28/2011   Procedure: [place];  Surgeon: [name], MD;  Location: [facility];  Service: General;  Laterality: N/A;  to be done at 745  . CESAREAN SECTION  05/1991  . COLONOSCOPY WITH PROPOFOL N/A 03/03/2016   Procedure: COLONOSCOPY WITH PROPOFOL;  Surgeon: Manus Gunning, MD;  Location: [facility];  Service: Gastroenterology;  Laterality: N/A;  . ESOPHAGEAL MANOMETRY N/A 05/13/2016   Procedure: ESOPHAGEAL MANOMETRY (EM);  Surgeon: Manus Gunning, MD;  Location: [facility];  Service: Gastroenterology;  Laterality: N/A;  . ESOPHAGOGASTRODUODENOSCOPY N/A 08/29/2012   Procedure: ESOPHAGOGASTRODUODENOSCOPY (EGD);  Surgeon: [name], MD;  Location: [facility];  Service: General;  Laterality: N/A;  . ESOPHAGOGASTRODUODENOSCOPY N/A 09/04/2014   Procedure: ESOPHAGOGASTRODUODENOSCOPY (EGD);  Surgeon: Alphonsa Overall, MD;  Location: [facility];  Service: General;  Laterality: N/A;  . ESOPHAGOGASTRODUODENOSCOPY (EGD) WITH PROPOFOL N/A  03/03/2016   Procedure: ESOPHAGOGASTRODUODENOSCOPY (EGD) WITH PROPOFOL;  Surgeon: Manus Gunning, MD;  Location: [facility];  Service: Gastroenterology;  Laterality: N/A;  . EUS  03/03/2012   Procedure: UPPER ENDOSCOPIC ULTRASOUND (EUS) LINEAR;  Surgeon: Milus Banister, MD;  Location: [facility];  Service: Endoscopy;  Laterality: N/A;  . GASTRIC ROUX-EN-Y N/A 12/07/2017   Procedure: LAPAROSCOPIC ROUX-EN-Y GASTRIC BYPASS WITH LYSIS OF ADHESIONS AND UPPER ENDOSCOPY ERAS Pathway;  Surgeon: Johnathan Hausen, MD;  Location: [facility];  Service: General;  Laterality: N/A;  . gist    . KNEE ARTHROSCOPY    . LAPAROSCOPIC GASTRECTOMY  04/08/2012  Procedure: LAPAROSCOPIC GASTRECTOMY;  Surgeon: [name], MD;  Location: [facility];  Service: General;;  removal of GIST tumor of stomach  . LAPAROSCOPIC GASTRIC SLEEVE RESECTION N/A 08/29/2012   Procedure: LAPAROSCOPIC GASTRIC SLEEVE RESECTION;  Surgeon: [name], MD;  Location: [facility];  Service: General;  Laterality: N/A;  Sleeve Gastrectomy  . LEFT HEART CATH AND CORONARY ANGIOGRAPHY N/A 05/28/2017   Procedure: LEFT HEART CATH AND CORONARY ANGIOGRAPHY;  Surgeon: Leonie Man, MD;  Location: [facility];  Service: Cardiovascular;  Laterality: N/A;  . ROUX-EN-Y GASTRIC BYPASS    . TOTAL KNEE ARTHROPLASTY Left 10/06/2013   Procedure: LEFT TOTAL KNEE ARTHROPLASTY;  Surgeon: Mcarthur Rossetti, MD;  Location: [facility];  Service: Orthopedics;  Laterality: Left;  . TUBAL LIGATION     Social History   Occupational History  . Not on file  Tobacco Use  . Smoking status: Never Smoker  . Smokeless tobacco: Never Used  Vaping Use  . Vaping Use: Never used  Substance and Sexual Activity  . Alcohol use: No    Alcohol/week: 0.0 standard drinks  . Drug use: No  . Sexual activity: Not on file

## 2020-06-06 ENCOUNTER — Other Ambulatory Visit: Payer: Self-pay | Admitting: Family Medicine

## 2020-06-06 DIAGNOSIS — F419 Anxiety disorder, unspecified: Secondary | ICD-10-CM

## 2020-06-19 ENCOUNTER — Ambulatory Visit: Payer: Medicare Other | Admitting: Podiatry

## 2020-06-24 ENCOUNTER — Other Ambulatory Visit: Payer: Self-pay | Admitting: Family Medicine

## 2020-06-24 MED ORDER — OXYCODONE-ACETAMINOPHEN 10-325 MG PO TABS: ORAL_TABLET | ORAL | 0 refills | Status: DC

## 2020-07-16 ENCOUNTER — Ambulatory Visit (INDEPENDENT_AMBULATORY_CARE_PROVIDER_SITE_OTHER): Payer: Medicare Other | Admitting: Podiatry

## 2020-07-16 ENCOUNTER — Other Ambulatory Visit: Payer: Self-pay

## 2020-07-16 DIAGNOSIS — M2041 Other hammer toe(s) (acquired), right foot: Secondary | ICD-10-CM | POA: Diagnosis not present

## 2020-07-16 DIAGNOSIS — Q828 Other specified congenital malformations of skin: Secondary | ICD-10-CM

## 2020-07-16 DIAGNOSIS — M2042 Other hammer toe(s) (acquired), left foot: Secondary | ICD-10-CM

## 2020-07-16 DIAGNOSIS — M7752 Other enthesopathy of left foot: Secondary | ICD-10-CM | POA: Diagnosis not present

## 2020-07-16 NOTE — Progress Notes (Signed)
  Subjective:  Patient ID: Crystal Garza, female    DOB: Apr 13, 1971,  MRN: [id number]  Chief Complaint  Patient presents with  . Callouses    Bilateral callus of both foot toes 2-4.   Marland Kitchen Plantar Warts    Possible plantar wart on bottom of left foot.     50 y.o. female presents with the above complaint. History confirmed with patient.   Objective:  Physical Exam: warm, good capillary refill, no trophic changes or ulcerative lesions, normal DP and PT pulses and normal sensory exam. HPKs bilateral distal medial 2nd toe, left 5th met head plantar, right lateral foot NWB area of lateral arch.  Assessment:   1. Hammer toes of both feet   2. Capsulitis of metatarsophalangeal (MTP) joint of left foot   3. Porokeratosis    Plan:  Patient was evaluated and treated and all questions answered.  Capsulitis and Hammertoe -Educated on etiologies of lesions -Debrided lesions, courtesy for patient -Discussed possible surgical intervention should issues persist. Should be amenable to 2nd hammertoe correction, 5th met head excision left, simple excision of lesion right.  Return if symptoms worsen or fail to improve.

## 2020-07-22 ENCOUNTER — Other Ambulatory Visit: Payer: Self-pay | Admitting: Family Medicine

## 2020-07-22 MED ORDER — OXYCODONE-ACETAMINOPHEN 10-325 MG PO TABS: ORAL_TABLET | ORAL | 0 refills | Status: DC

## 2020-07-23 ENCOUNTER — Other Ambulatory Visit: Payer: Self-pay

## 2020-07-23 ENCOUNTER — Ambulatory Visit (INDEPENDENT_AMBULATORY_CARE_PROVIDER_SITE_OTHER): Payer: Medicare Other

## 2020-07-23 VITALS — BP 102/68 | HR 76 | Ht 67.0 in | Wt 290.0 lb

## 2020-07-23 DIAGNOSIS — Z0000 Encounter for general adult medical examination without abnormal findings: Secondary | ICD-10-CM | POA: Diagnosis not present

## 2020-07-23 NOTE — Patient Instructions (Signed)
You spoke to Crystal Garza, [place] for your annual wellness visit.  We discussed goals: Goals    . DIET - INCREASE WATER INTAKE      We also discussed recommended health maintenance. As discussed, you are due for below. We have scheduled you an apt with PCP for 09/11/2020.  Health Maintenance  Topic Date Due  . Hepatitis C Screening  Never done  . LIPID PANEL  05/19/2018  . MAMMOGRAM  06/02/2019  . INFLUENZA VACCINE  09/08/2020 (Originally 12/03/2019)  . COVID-19 Vaccine (3 - Booster for [place] series) 09/29/2020  . TETANUS/TDAP  11/13/2020  . COLONOSCOPY (Pts 45-17yrs Insurance coverage will need to be confirmed)  03/03/2026  . HIV Screening  Completed  . HPV VACCINES  Aged Out   Booster due 08/30/2020. PCP apt made for 09/11/2020. Mammogram due. Keep up the great work walking!   Health Maintenance, Female Adopting a healthy lifestyle and getting preventive care are important in promoting health and wellness. Ask your health care provider about:  The right schedule for you to have regular tests and exams.  Things you can do on your own to prevent diseases and keep yourself healthy. What should I know about diet, weight, and exercise? Eat a healthy diet  Eat a diet that includes plenty of vegetables, fruits, low-fat dairy products, and lean protein.  Do not eat a lot of foods that are high in solid fats, added sugars, or sodium.   Maintain a healthy weight Body mass index (BMI) is used to identify weight problems. It estimates body fat based on height and weight. Your health care provider can help determine your BMI and help you achieve or maintain a healthy weight. Get regular exercise Get regular exercise. This is one of the most important things you can do for your health. Most adults should:  Exercise for at least 150 minutes each week. The exercise should increase your heart rate and make you sweat (moderate-intensity exercise).  Do strengthening exercises at least twice a  week. This is in addition to the moderate-intensity exercise.  Spend less time sitting. Even light physical activity can be beneficial. Watch cholesterol and blood lipids Have your blood tested for lipids and cholesterol at 52 years of age, then have this test every 5 years. Have your cholesterol levels checked more often if:  Your lipid or cholesterol levels are high.  You are older than 52 years of age.  You are at high risk for heart disease. What should I know about cancer screening? Depending on your health history and family history, you may need to have cancer screening at various ages. This may include screening for:  Breast cancer.  Cervical cancer.  Colorectal cancer.  Skin cancer.  Lung cancer. What should I know about heart disease, diabetes, and high blood pressure? Blood pressure and heart disease  High blood pressure causes heart disease and increases the risk of stroke. This is more likely to develop in people who have high blood pressure readings, are of African descent, or are overweight.  Have your blood pressure checked: ? Every 3-5 years if you are 3-30 years of age. ? Every year if you are 6 years old or older. Diabetes Have regular diabetes screenings. This checks your fasting blood sugar level. Have the screening done:  Once every three years after age 26 if you are at a normal weight and have a low risk for diabetes.  More often and at a younger age if you are overweight  or have a high risk for diabetes. What should I know about preventing infection? Hepatitis B If you have a higher risk for hepatitis B, you should be screened for this virus. Talk with your health care provider to find out if you are at risk for hepatitis B infection. Hepatitis C Testing is recommended for:  Everyone born from [birth year] through 1967.  Anyone with known risk factors for hepatitis C. Sexually transmitted infections (STIs)  Get screened for STIs, including gonorrhea  and chlamydia, if: ? You are sexually active and are younger than 52 years of age. ? You are older than 52 years of age and your health care provider tells you that you are at risk for this type of infection. ? Your sexual activity has changed since you were last screened, and you are at increased risk for chlamydia or gonorrhea. Ask your health care provider if you are at risk.  Ask your health care provider about whether you are at high risk for HIV. Your health care provider may recommend a prescription medicine to help prevent HIV infection. If you choose to take medicine to prevent HIV, you should first get tested for HIV. You should then be tested every 3 months for as long as you are taking the medicine. Pregnancy  If you are about to stop having your period (premenopausal) and you may become pregnant, seek counseling before you get pregnant.  Take 400 to 800 micrograms (mcg) of folic acid every day if you become pregnant.  Ask for birth control (contraception) if you want to prevent pregnancy. Osteoporosis and menopause Osteoporosis is a disease in which the bones lose minerals and strength with aging. This can result in bone fractures. If you are 39 years old or older, or if you are at risk for osteoporosis and fractures, ask your health care provider if you should:  Be screened for bone loss.  Take a calcium or vitamin D supplement to lower your risk of fractures.  Be given hormone replacement therapy (HRT) to treat symptoms of menopause. Follow these instructions at home: Lifestyle  Do not use any products that contain nicotine or tobacco, such as cigarettes, e-cigarettes, and chewing tobacco. If you need help quitting, ask your health care provider.  Do not use street drugs.  Do not share needles.  Ask your health care provider for help if you need support or information about quitting drugs. Alcohol use  Do not drink alcohol if: ? Your health care provider tells you not  to drink. ? You are pregnant, may be pregnant, or are planning to become pregnant.  If you drink alcohol: ? Limit how much you use to 0-1 drink a day. ? Limit intake if you are breastfeeding.  Be aware of how much alcohol is in your drink. In the [place] one drink equals one 12 oz bottle of beer (355 mL), one 5 oz glass of wine (148 mL), or one 1 oz glass of hard liquor (44 mL). General instructions  Schedule regular health, dental, and eye exams.  Stay current with your vaccines.  Tell your health care provider if: ? You often feel depressed. ? You have ever been abused or do not feel safe at home. Summary  Adopting a healthy lifestyle and getting preventive care are important in promoting health and wellness.  Follow your health care provider's instructions about healthy diet, exercising, and getting tested or screened for diseases.  Follow your health care provider's instructions on monitoring your cholesterol and blood  pressure. This information is not intended to replace advice given to you by your health care provider. Make sure you discuss any questions you have with your health care provider. Document Revised: 04/13/2018 Document Reviewed: 04/13/2018 Elsevier Patient Education  2021 [place].    Our clinic's number is [phone]. Please call with questions or concerns about what we discussed today.

## 2020-07-23 NOTE — Progress Notes (Addendum)
Subjective:   Crystal Garza is a 50 y.o. female who presents for Medicare Annual (Subsequent) preventive examination.  Review of Systems: Defer to PCP.  Cardiac Risk Factors include: hypertension;obesity (BMI >30kg/m2)  Objective:   Vitals: BP 102/68   Pulse 76   Ht 5\' 7"  (1.702 m)   Wt 290 lb (131.5 kg)   SpO2 99%   BMI 45.42 kg/m   Body mass index is 45.42 kg/m.  Advanced Directives 07/23/2020 05/23/2019 02/22/2019 11/02/2018 10/07/2018 02/04/2018 01/11/2018  Does Patient Have a Medical Advance Directive? No No No No No No No  Type of Advance Directive - - - - - - -  Copy of Healthcare Power of Attorney in Chart? - - - - - - -  Would patient like information on creating a medical advance directive? No - Patient declined Yes (MAU/Ambulatory/Procedural Areas - Information given) - No - Patient declined No - Patient declined No - Patient declined No - Patient declined  Pre-existing out of facility DNR order (yellow form or pink MOST form) - - - - - - -   Tobacco Social History   Tobacco Use  Smoking Status Never Smoker  Smokeless Tobacco Never Used     Clinical Intake:  Pre-visit preparation completed: Yes  Pain Score: 7   How often do you need to have someone help you when you read instructions, pamphlets, or other written materials from your doctor or pharmacy?: 2 - Rarely What is the last grade level you completed in school?: high school  Interpreter Needed?: No  Past Medical History:  Diagnosis Date  . Anemia    iron def  . Anginal pain ([place])   . Anxiety   . Arthritis    left knee   . Back pain   . Benign gastrointestinal stromal tumor (GIST)   . Chronic knee pain   . Depression   . DVT (deep venous thrombosis) ([place])   . Esophageal dysmotility   . GERD (gastroesophageal reflux disease)   . Hiatal hernia   . History of kidney stones   . Hx of laparoscopic gastric banding   . Hypertension   . Internal hemorrhoids   . Internal hemorrhoids   . Migraine    . Morbid obesity ([place])   . PE (pulmonary embolism)    Past Surgical History:  Procedure Laterality Date  . [place] STUDY N/A 05/13/2016   Procedure: [place] STUDY;  Surgeon: Manus Gunning, MD;  Location: [facility];  Service: Gastroenterology;  Laterality: N/A;  . ABDOMINAL HYSTERECTOMY    . BIOPSY  02/16/2012   Procedure: BIOPSY;  Surgeon: [name], MD;  Location: [facility];  Service: General;;  biopsy of mass x 2  . BREATH TEK H PYLORI  07/28/2011   Procedure: [place];  Surgeon: [name], MD;  Location: [facility];  Service: General;  Laterality: N/A;  to be done at 745  . CESAREAN SECTION  05/1991  . COLONOSCOPY WITH PROPOFOL N/A 03/03/2016   Procedure: COLONOSCOPY WITH PROPOFOL;  Surgeon: Manus Gunning, MD;  [facility];  Service: Gastroenterology;  Laterality: N/A;  . ESOPHAGEAL MANOMETRY N/A 05/13/2016   Procedure: ESOPHAGEAL MANOMETRY (EM);  Surgeon: Manus Gunning, MD;  [facility];  Service: Gastroenterology;  Laterality: N/A;  . ESOPHAGOGASTRODUODENOSCOPY N/A 08/29/2012   Procedure: ESOPHAGOGASTRODUODENOSCOPY (EGD);  Surgeon: [name], MD;  [facility];  Service: General;  Laterality: N/A;  . ESOPHAGOGASTRODUODENOSCOPY N/A 09/04/2014  Procedure: ESOPHAGOGASTRODUODENOSCOPY (EGD);  Surgeon: Alphonsa Overall, MD;  Location: [facility];  Service: General;  Laterality: N/A;  . ESOPHAGOGASTRODUODENOSCOPY (EGD) WITH PROPOFOL N/A 03/03/2016   Procedure: ESOPHAGOGASTRODUODENOSCOPY (EGD) WITH PROPOFOL;  Surgeon: Manus Gunning, MD;  [facility];  Service: Gastroenterology;  Laterality: N/A;  . EUS  03/03/2012   Procedure: UPPER ENDOSCOPIC ULTRASOUND (EUS) LINEAR;  Surgeon: Milus Banister, MD;  [facility];  Service: Endoscopy;  Laterality: N/A;  . GASTRIC ROUX-EN-Y N/A 12/07/2017   Procedure: LAPAROSCOPIC ROUX-EN-Y GASTRIC BYPASS WITH LYSIS OF ADHESIONS AND UPPER ENDOSCOPY  ERAS Pathway;  Surgeon: Johnathan Hausen, MD;  Location[facility];  Service: General;  Laterality: N/A;  . gist    . KNEE ARTHROSCOPY    . LAPAROSCOPIC GASTRECTOMY  04/08/2012   Procedure: LAPAROSCOPIC GASTRECTOMY;  Surgeon: [name], MD;  Location[facility];  Service: General;;  removal of GIST tumor of stomach  . LAPAROSCOPIC GASTRIC SLEEVE RESECTION N/A 08/29/2012   Procedure: LAPAROSCOPIC GASTRIC SLEEVE RESECTION;  Surgeon: [name], MD;  Location[facility];  Service: General;  Laterality: N/A;  Sleeve Gastrectomy  . LEFT HEART CATH AND CORONARY ANGIOGRAPHY N/A 05/28/2017   Procedure: LEFT HEART CATH AND CORONARY ANGIOGRAPHY;  Surgeon: Leonie Man, MD;  Location: [facility];  Service: Cardiovascular;  Laterality: N/A;  . ROUX-EN-Y GASTRIC BYPASS    . TOTAL KNEE ARTHROPLASTY Left 10/06/2013   Procedure: LEFT TOTAL KNEE ARTHROPLASTY;  Surgeon: Mcarthur Rossetti, MD;  [facility];  Service: Orthopedics;  Laterality: Left;  . TUBAL LIGATION     Family History  Problem Relation Age of Onset  . Diabetes Mother   . Diabetes Father   . Heart disease Father   . Colon cancer Father        brain tumor  . Colon polyps Father   . Colitis Father   . Irritable bowel syndrome Father   . Diabetes Maternal Grandmother   . Diabetes Maternal Grandfather   . Schizophrenia Maternal Grandfather   . Heart disease Paternal Uncle   . Diabetes Paternal Uncle   . Heart disease Paternal Grandmother   . Heart disease Paternal Grandfather   . Diabetes Maternal Aunt   . Diabetes Maternal Uncle   . Diabetes Paternal Aunt   . Cancer Other   . Hypertension Other   . Esophageal cancer Neg Hx    Social History   Socioeconomic History  . Marital status: Divorced    Spouse name: Not on file  . Number of children: 3  . Years of education: 62  . Highest education level: Not on file  Occupational History  . Not on file  Tobacco Use  . Smoking status: Never Smoker  . Smokeless  tobacco: Never Used  Vaping Use  . Vaping Use: Never used  Substance and Sexual Activity  . Alcohol use: No    Alcohol/week: 0.0 standard drinks  . Drug use: No  . Sexual activity: Yes    Birth control/protection: Surgical  Other Topics Concern  . Not on file  Social History Narrative   Patient has been on disability since 2004 for knee pain, back pain, depression and acid reflux. She does work part-time now as Quarry manager.      She is caring for her granddaughter Amia age 36 (has sickle cell, goes to [facility] every month and has had multiple hospitalizations).       Patient enjoys going on walks, going to the beach, and cooking.  Social Determinants of Health   Financial Resource Strain: Low Risk   . Difficulty of Paying Living Expenses: Not hard at all  Food Insecurity: No Food Insecurity  . Worried About Charity fundraiser in the Last Year: Never true  . Ran Out of Food in the Last Year: Never true  Transportation Needs: No Transportation Needs  . Lack of Transportation (Medical): No  . Lack of Transportation (Non-Medical): No  Physical Activity: Sufficiently Active  . Days of Exercise per Week: 4 days  . Minutes of Exercise per Session: 40 min  Stress: Stress Concern Present  . Feeling of Stress : Very much  Social Connections: Moderately Isolated  . Frequency of Communication with Friends and Family: More than three times a week  . Frequency of Social Gatherings with Friends and Family: More than three times a week  . Attends Religious Services: More than 4 times per year  . Active Member of Clubs or Organizations: No  . Attends Archivist Meetings: Never  . Marital Status: Divorced   Outpatient Encounter Medications as of 07/23/2020  Medication Sig  . ALPRAZolam (XANAX) 1 MG tablet TAKE 1 TABLET 3 TIMES DAILY AS NEEDED FOR ANXIETY.  Marland Kitchen DEXILANT 60 MG capsule TAKE (1) CAPSULE DAILY.  Marland Kitchen dexlansoprazole (DEXILANT) 60 MG capsule Take 1 capsule (60 mg total) by mouth  daily.  . DULoxetine (CYMBALTA) 20 MG capsule Take one by mouth daily  . lamoTRIgine (LAMICTAL) 200 MG tablet Take 1 tablet (200 mg total) by mouth at bedtime.  Marland Kitchen LINZESS 145 MCG CAPS capsule TAKE 1 CAPSULE ONCE DAILY BEFORE BREAKFAST.  Marland Kitchen lisinopril (ZESTRIL) 10 MG tablet TAKE 1 TABLET EACH DAY.  Marland Kitchen ondansetron (ZOFRAN-ODT) 4 MG disintegrating tablet Take 1 tablet (4 mg total) by mouth 2 (two) times daily as needed for nausea or vomiting.  Marland Kitchen oxyCODONE-acetaminophen (PERCOCET) 10-325 MG tablet TAKE ONE TABLET EVERY SIX HOURS AS NEEDED FOR CHRONIC PAIN.  Marland Kitchen risperiDONE (RISPERDAL) 1 MG tablet Take 1 tablet (1 mg total) by mouth at bedtime.  . solifenacin (VESICARE) 5 MG tablet Take 5 mg by mouth daily.  Alveda Reasons 10 MG TABS tablet TAKE 1 TABLET ONCE DAILY.   No facility-administered encounter medications on file as of 07/23/2020.   Activities of Daily Living In your present state of health, do you have any difficulty performing the following activities: 07/23/2020  Hearing? N  Vision? N  Difficulty concentrating or making decisions? N  Walking or climbing stairs? N  Dressing or bathing? N  Doing errands, shopping? N  Preparing Food and eating ? N  Using the Toilet? N  In the past six months, have you accidently leaked urine? Y  Do you have problems with loss of bowel control? N  Managing your Medications? N  Managing your Finances? N  Housekeeping or managing your Housekeeping? N  Some recent data might be hidden   Patient Care Team: Dickie La, MD as PCP - General (Family Medicine) Eunice Blase, MD Evelina Bucy, DPM as Consulting Physician (Podiatry) Armbruster, Carlota Raspberry, MD as Consulting Physician (Gastroenterology)  Patient reports she has a Urologist, however does not know their name.     Assessment:   This is a routine wellness examination for Crystal Garza.  Exercise Activities and Dietary recommendations Current Exercise Habits: Home exercise routine, Type of exercise:  walking, Time (Minutes): 45, Frequency (Times/Week): 4, Weekly Exercise (Minutes/Week): 180, Intensity: Mild, Exercise limited by: orthopedic condition(s)  Goals    . DIET -  INCREASE WATER INTAKE      Fall Risk Fall Risk  03/29/2019 10/07/2018 09/07/2018 01/11/2018 11/10/2017  Falls in the past year? 0 0 0 No Yes  Number falls in past yr: - - 0 - 1  Injury with Fall? - - - - No  Comment - - - - just bumps and bruises  Risk Factor Category  - - - - -  Risk for fall due to : - - - - -  Follow up - - Falls evaluation completed - -   Is the patient's home free of loose throw rugs in walkways, pet beds, electrical cords, etc?   yes      Grab bars in the bathroom? yes      Handrails on the stairs?   yes      Adequate lighting?   yes  Patient rating of health (0-10) scale: 7  Depression Screen PHQ 2/9 Scores 07/23/2020 03/29/2019 11/02/2018 09/07/2018  PHQ - 2 Score 1 0 0 1  PHQ- 9 Score - - - -    Cognitive Function  6CIT Screen 07/23/2020  What Year? 0 points  What month? 0 points  What time? 0 points  Count back from 20 0 points  Months in reverse 0 points  Repeat phrase 0 points  Total Score 0   Immunization History  Administered Date(s) Administered  . Influenza Split 06/01/2011, 02/17/2012, 01/17/2013  . Influenza Whole 03/18/2009  . Influenza,inj,Quad PF,6+ Mos 01/03/2014, 02/06/2015, 02/19/2016, 02/24/2017, 05/05/2018  . Influenza-Unspecified 09/22/2017, 12/03/2017  . PFIZER(Purple Top)SARS-COV-2 Vaccination 03/11/2020, 04/01/2020  . PPD Test 09/22/2010, 09/11/2011, 10/17/2012, 11/06/2013, 10/31/2014, 10/07/2015, 11/12/2017, 03/21/2020   Screening Tests Health Maintenance  Topic Date Due  . Hepatitis C Screening  Never done  . LIPID PANEL  05/19/2018  . MAMMOGRAM  06/02/2019  . INFLUENZA VACCINE  12/03/2019  . COVID-19 Vaccine (3 - Booster for [place] series) 09/29/2020  . TETANUS/TDAP  11/13/2020  . COLONOSCOPY (Pts 45-38yrs Insurance coverage will need to be confirmed)   03/03/2026  . HIV Screening  Completed  . HPV VACCINES  Aged Out   Cancer Screenings: Lung: Low Dose CT Chest recommended if Age 47-80 years, 30 pack-year currently smoking OR have quit w/in 15years. Patient does not qualify. Breast:  Up to date on Mammogram? No- counseled on need and discussed overdue date Up to date of Bone Density/Dexa? NA Colorectal: 2017- on 10 year track  Additional Screenings: Hepatitis C Screening: Not completed HIV Screening: Completed   Plan:  Booster due 08/30/2020. PCP apt made for 09/11/2020. Mammogram due. Keep up the great work walking!  I have personally reviewed and noted the following in the patient's chart:   . Medical and social history . Use of alcohol, tobacco or illicit drugs  . Current medications and supplements . Functional ability and status . Nutritional status . Physical activity . Advanced directives . List of other physicians . Hospitalizations, surgeries, and ER visits in previous 12 months . Vitals . Screenings to include cognitive, depression, and falls . Referrals and appointments  In addition, I have reviewed and discussed with patient certain preventive protocols, quality metrics, and best practice recommendations. A written personalized care plan for preventive services as well as general preventive health recommendations were provided to patient.  Dorna Bloom, [place]  07/23/2020   I have reviewed this visit and agree with the documentation.

## 2020-08-12 ENCOUNTER — Other Ambulatory Visit: Payer: Self-pay | Admitting: Family Medicine

## 2020-08-12 ENCOUNTER — Other Ambulatory Visit: Payer: Self-pay

## 2020-08-12 ENCOUNTER — Other Ambulatory Visit: Payer: Self-pay | Admitting: Gastroenterology

## 2020-08-12 MED ORDER — LINACLOTIDE 145 MCG PO CAPS: 145.0000 ug | ORAL_CAPSULE | Freq: Every day | ORAL | 0 refills | Status: DC

## 2020-08-13 MED ORDER — OXYCODONE-ACETAMINOPHEN 10-325 MG PO TABS: ORAL_TABLET | ORAL | 0 refills | Status: DC

## 2020-09-11 ENCOUNTER — Ambulatory Visit (INDEPENDENT_AMBULATORY_CARE_PROVIDER_SITE_OTHER): Payer: Medicare Other

## 2020-09-11 ENCOUNTER — Ambulatory Visit (INDEPENDENT_AMBULATORY_CARE_PROVIDER_SITE_OTHER): Payer: Medicare Other | Admitting: Family Medicine

## 2020-09-11 ENCOUNTER — Other Ambulatory Visit: Payer: Self-pay

## 2020-09-11 ENCOUNTER — Encounter: Payer: Self-pay | Admitting: Family Medicine

## 2020-09-11 VITALS — BP 108/72 | HR 74 | Ht 67.0 in | Wt 296.6 lb

## 2020-09-11 DIAGNOSIS — Z0000 Encounter for general adult medical examination without abnormal findings: Secondary | ICD-10-CM | POA: Diagnosis not present

## 2020-09-11 DIAGNOSIS — C49A2 Gastrointestinal stromal tumor of stomach: Secondary | ICD-10-CM | POA: Diagnosis not present

## 2020-09-11 DIAGNOSIS — Z86711 Personal history of pulmonary embolism: Secondary | ICD-10-CM

## 2020-09-11 DIAGNOSIS — I10 Essential (primary) hypertension: Secondary | ICD-10-CM | POA: Diagnosis not present

## 2020-09-11 DIAGNOSIS — Z1159 Encounter for screening for other viral diseases: Secondary | ICD-10-CM | POA: Diagnosis not present

## 2020-09-11 DIAGNOSIS — Z800 Family history of malignant neoplasm of digestive organs: Secondary | ICD-10-CM | POA: Diagnosis not present

## 2020-09-11 DIAGNOSIS — Z23 Encounter for immunization: Secondary | ICD-10-CM

## 2020-09-11 DIAGNOSIS — Z91018 Allergy to other foods: Secondary | ICD-10-CM

## 2020-09-11 DIAGNOSIS — F419 Anxiety disorder, unspecified: Secondary | ICD-10-CM

## 2020-09-11 DIAGNOSIS — I2699 Other pulmonary embolism without acute cor pulmonale: Secondary | ICD-10-CM | POA: Diagnosis not present

## 2020-09-11 DIAGNOSIS — G894 Chronic pain syndrome: Secondary | ICD-10-CM

## 2020-09-11 MED ORDER — ALPRAZOLAM 1 MG PO TABS
ORAL_TABLET | ORAL | 5 refills | Status: DC
Start: 2020-09-11 — End: 2021-03-04

## 2020-09-11 NOTE — Patient Instructions (Signed)
Please get your mammogram I will send you a note about your labs GREAT to see you

## 2020-09-12 ENCOUNTER — Encounter: Payer: Self-pay | Admitting: Family Medicine

## 2020-09-12 LAB — COMPREHENSIVE METABOLIC PANEL
ALT: 8 IU/L (ref 0–32)
AST: 17 IU/L (ref 0–40)
Albumin/Globulin Ratio: 1.3 (ref 1.2–2.2)
Albumin: 3.9 g/dL (ref 3.8–4.9)
Alkaline Phosphatase: 86 IU/L (ref 44–121)
BUN/Creatinine Ratio: 12 (ref 9–23)
BUN: 8 mg/dL (ref 6–24)
Bilirubin Total: 0.4 mg/dL (ref 0.0–1.2)
CO2: 22 mmol/L (ref 20–29)
Calcium: 9.2 mg/dL (ref 8.7–10.2)
Chloride: 108 mmol/L — ABNORMAL HIGH (ref 96–106)
Creatinine, Ser: 0.66 mg/dL (ref 0.57–1.00)
Globulin, Total: 3 g/dL (ref 1.5–4.5)
Glucose: 75 mg/dL (ref 65–99)
Potassium: 4.2 mmol/L (ref 3.5–5.2)
Sodium: 145 mmol/L — ABNORMAL HIGH (ref 134–144)
Total Protein: 6.9 g/dL (ref 6.0–8.5)
eGFR: 105 mL/min/{1.73_m2} (ref >59)

## 2020-09-12 LAB — HEPATITIS C ANTIBODY: Hep C Virus Ab: <0.1 s/co ratio (ref 0.0–0.9)

## 2020-09-12 LAB — CBC
Hematocrit: 38.4 % (ref 34.0–46.6)
Hemoglobin: 12.4 g/dL (ref 11.1–15.9)
MCH: 26.9 pg (ref 26.6–33.0)
MCHC: 32.3 g/dL (ref 31.5–35.7)
MCV: 83 fL (ref 79–97)
Platelets: 374 10*3/uL (ref 150–450)
RBC: 4.61 x10E6/uL (ref 3.77–5.28)
RDW: 14 % (ref 11.7–15.4)
WBC: 8 10*3/uL (ref 3.4–10.8)

## 2020-09-12 LAB — LIPID PANEL
Chol/HDL Ratio: 2.8 ratio (ref 0.0–4.4)
Cholesterol, Total: 149 mg/dL (ref 100–199)
HDL: 53 mg/dL (ref >39)
LDL Chol Calc (NIH): 81 mg/dL (ref 0–99)
Triglycerides: 77 mg/dL (ref 0–149)
VLDL Cholesterol Cal: 15 mg/dL (ref 5–40)

## 2020-09-13 ENCOUNTER — Encounter: Payer: Self-pay | Admitting: Family Medicine

## 2020-09-13 DIAGNOSIS — Z0000 Encounter for general adult medical examination without abnormal findings: Secondary | ICD-10-CM | POA: Insufficient documentation

## 2020-09-13 DIAGNOSIS — Z91018 Allergy to other foods: Secondary | ICD-10-CM | POA: Insufficient documentation

## 2020-09-13 NOTE — Assessment & Plan Note (Signed)
Remains on lifetime anticoagulation

## 2020-09-13 NOTE — Assessment & Plan Note (Signed)
At her last colonoscopy they told her to follow-up in 10 years.  Since then the guidelines have changed so I asked her to call her GI doctor and make sure that it was still 10 years rather than 7 years.  She will let me know if she needs [name] to do anything.

## 2020-09-13 NOTE — Assessment & Plan Note (Signed)
Refilled pain meds 

## 2020-09-13 NOTE — Assessment & Plan Note (Signed)
Hepatitis C screening today.  She is going to check on her return time for her next colonoscopy.

## 2020-09-13 NOTE — Assessment & Plan Note (Signed)
Check CBC today.  

## 2020-09-13 NOTE — Assessment & Plan Note (Signed)
Continue current medication regimen

## 2020-09-16 ENCOUNTER — Encounter: Payer: Self-pay | Admitting: Family Medicine

## 2020-09-17 MED ORDER — ONDANSETRON 4 MG PO TBDP: 4.0000 mg | ORAL_TABLET | Freq: Two times a day (BID) | ORAL | 3 refills | Status: DC | PRN

## 2020-09-17 MED ORDER — OXYCODONE-ACETAMINOPHEN 10-325 MG PO TABS: ORAL_TABLET | ORAL | 0 refills | Status: DC

## 2020-10-08 ENCOUNTER — Telehealth: Payer: Self-pay | Admitting: Gastroenterology

## 2020-10-08 NOTE — Telephone Encounter (Signed)
Spoke with patient, she states that when she drinks or eats it feels like everything sits in her stomach for a couple of minutes then comes back up. She states that she has regurgitation but no vomiting. She has some nausea but get some relief with Zofran BID. She states that she is still taking Linzess 145 mcg daily, Dexilant 60 mg daily and she has been taking 2.5 capfuls of Miralax every day. Patient states that she has generalized abdominal pain when she has to have a BM, she has some lingering pain after a BM. She states that she will constipation then will have the urgency to have a bowel movement but it is hard to pass at time. Patient has been scheduled for a follow up on Wednesday, 11/13/20 at 9:20 am. Please advise on any further recommendations until appt. Thanks.

## 2020-10-08 NOTE — Telephone Encounter (Signed)
If she has found some benefit with Linzess at 145 mcg daily she can try increasing to twice daily or take 290 mcg once at a time to see if that will help her constipation.  She has had an extensive evaluation for her reflux in the past, on Dexilant.  Not sure if the narcotics she is taking is causing some functional delay in her stomach emptying as well.  I would like to see her in clinic to discuss other options such as Reglan etc. if she has not tried that.  Otherwise we will see how she does with higher dose Linzess if she is willing.  We may consider moving her colonoscopy up sooner than currently scheduled if she is anxious about this in light of her family history of colon cancer.  We also have other regimens to treat constipation should higher dose Linzess not work.

## 2020-10-08 NOTE — Telephone Encounter (Signed)
Lm on vm for patient to return call 

## 2020-10-09 NOTE — Telephone Encounter (Signed)
Lm on vm for patient to return call 

## 2020-10-10 NOTE — Telephone Encounter (Signed)
Spoke with patient in regards to recommendations. Patient states that it is more so the pain that bothers her. Patient states that she will try the increased dose of Linzess 145 mcg BID for a week or so. Patient would like to see Dr. Havery Moros, advised that right now he does not have any sooner availability. Advised patient that if she has severe pain she will need to go the ED or urgent care for evaluation. Patient verbalized understanding and had no concerns at the end of the call.

## 2020-10-14 MED ORDER — OXYCODONE-ACETAMINOPHEN 10-325 MG PO TABS: ORAL_TABLET | ORAL | 0 refills | Status: DC

## 2020-10-14 NOTE — Addendum Note (Signed)
Addended by: Dorna Bloom on: 10/14/2020 09:45 AM   Modules accepted: Orders

## 2020-10-22 DIAGNOSIS — Z20822 Contact with and (suspected) exposure to covid-19: Secondary | ICD-10-CM | POA: Diagnosis not present

## 2020-10-25 ENCOUNTER — Other Ambulatory Visit: Payer: Self-pay | Admitting: Student

## 2020-10-25 ENCOUNTER — Other Ambulatory Visit (HOSPITAL_COMMUNITY): Payer: Self-pay | Admitting: Student

## 2020-10-25 DIAGNOSIS — R109 Unspecified abdominal pain: Secondary | ICD-10-CM

## 2020-10-28 ENCOUNTER — Ambulatory Visit (HOSPITAL_COMMUNITY)
Admission: RE | Admit: 2020-10-28 | Discharge: 2020-10-28 | Disposition: A | Discharge status: Home / Self Care | Payer: Medicare Other | Source: Ambulatory Visit | Attending: Student | Admitting: Student

## 2020-10-28 ENCOUNTER — Other Ambulatory Visit: Payer: Self-pay

## 2020-10-28 DIAGNOSIS — R109 Unspecified abdominal pain: Secondary | ICD-10-CM | POA: Insufficient documentation

## 2020-10-28 DIAGNOSIS — K760 Fatty (change of) liver, not elsewhere classified: Secondary | ICD-10-CM | POA: Diagnosis not present

## 2020-10-28 LAB — POCT I-STAT CREATININE: Creatinine, Ser: 0.7 mg/dL (ref 0.44–1.00)

## 2020-10-28 MED ORDER — IOHEXOL 300 MG/ML  SOLN
100.0000 mL | Freq: Once | INTRAMUSCULAR | Status: AC | PRN
  Administered 2020-10-28: 100 mL via INTRAVENOUS

## 2020-10-28 MED ORDER — IOHEXOL 9 MG/ML PO SOLN
ORAL | Status: AC
  Filled 2020-10-28: qty 1000

## 2020-10-28 MED ORDER — SODIUM CHLORIDE (PF) 0.9 % IJ SOLN
INTRAMUSCULAR | Status: AC
  Filled 2020-10-28: qty 50

## 2020-10-28 MED ORDER — IOHEXOL 9 MG/ML PO SOLN: 500.0000 mL | ORAL | Status: AC

## 2020-11-04 ENCOUNTER — Other Ambulatory Visit: Payer: Self-pay | Admitting: Gastroenterology

## 2020-11-05 ENCOUNTER — Ambulatory Visit (INDEPENDENT_AMBULATORY_CARE_PROVIDER_SITE_OTHER): Payer: Medicare Other | Admitting: Allergy

## 2020-11-05 ENCOUNTER — Encounter: Payer: Self-pay | Admitting: Allergy

## 2020-11-05 ENCOUNTER — Other Ambulatory Visit: Payer: Self-pay

## 2020-11-05 VITALS — BP 128/78 | HR 89 | Temp 98.0°F | Resp 18 | Ht 67.0 in | Wt 289.0 lb

## 2020-11-05 DIAGNOSIS — R21 Rash and other nonspecific skin eruption: Secondary | ICD-10-CM | POA: Diagnosis not present

## 2020-11-05 DIAGNOSIS — J3089 Other allergic rhinitis: Secondary | ICD-10-CM | POA: Diagnosis not present

## 2020-11-05 DIAGNOSIS — T781XXD Other adverse food reactions, not elsewhere classified, subsequent encounter: Secondary | ICD-10-CM | POA: Insufficient documentation

## 2020-11-05 MED ORDER — CETIRIZINE HCL 10 MG PO TABS: 10.0000 mg | ORAL_TABLET | Freq: Two times a day (BID) | ORAL | 5 refills | Status: DC

## 2020-11-05 NOTE — Progress Notes (Signed)
New Patient Note  RE: Crystal Garza MRN: [id number] DOB: 05/07/1970 Date of Office Visit: 11/05/2020  Consult requested by: Dickie La, MD Primary care provider: Dickie La, MD  Chief Complaint: Urticaria  History of Present Illness: I had the pleasure of seeing Crystal Garza for initial evaluation at the [facility] on 11/05/2020. She is a 50 y.o. female, who is referred here by Dickie La, MD for the evaluation of urticaria. food allergy.  Last seen in our office in 2018 for allergic rhinitis and chronic hives.   Rash  Rash started about 4 years ago. Mainly occurs on her back, arm. This can occur about 2 times per week. Describes them as itchy, little bumps. Individual rashes lasts about 1-2 days. No ecchymosis upon resolution. Associated symptoms include: none. Suspected triggers are unknown - possibly fruits. Denies any fevers, chills, changes in medications, foods, personal care products or recent infections. She has tried the following therapies: benadryl prn with minimal benefit. She tried zyrtec once a day for 5-7 months with unknown benefit.  Previous work up includes: 2018 intradermal testing positive to grass, ragweed, weed, trees, mold, cat; 2018 skin prick testing negative to select foods. 2018 bloodwork - negative to seafood panel, CU index, tryptase, ANA, ESR, coffee. Elevated crp.   Patient is up to date with the following cancer screening tests: physical exam, scheduled for colonoscopy; needs mammogram and pap smears.   Currently avoiding tuna but breaks out even when not eating tuna. Fresh pineapples caused shortness of breath for a few minutes yesterday and today but no other symptoms.   Sometimes fresh fruits make her itch.   Past work up includes: 2018 immunocap which showed negative seafood panel and skin prick testing which showed negative to foods.  Dietary History: patient has been eating other foods including milk, eggs, peanut, sesame,  shellfish, fish - salmon, soy, wheat, meats, fruits and vegetables. Not eating tree nuts as she can't chew them.   She reports reading labels and avoiding tuna in diet completely.   Currently using Vaseline products.    Allergic rhinitis Currently not on any medications and asymptomatic.   Assessment and Plan: Crystal Garza is a 49 y.o. female with: Rash and other nonspecific skin eruption Breaking out in rash for the past 4 years about 2 times per week. No change since onset. Outbreaks last 1-2 days. No specific triggers noted but concerned about developing new allergies. 2018 intradermal testing positive to grass, ragweed, weed, trees, mold, cat; 2018 skin prick testing negative to select foods. 2018 bloodwork - negative to seafood panel, CU index, tryptase, ANA, ESR, coffee. Elevated crp. Tried zyrtec and benadryl with unknown benefit. Today's skin testing showed: Negative to environmental allergy panel, food panel. Given above clinical history and test results, not sure what's causing/triggering the rash. Start zyrtec (cetirizine) 85m twice a day. If rash/itching is not controlled or causes drowsiness let uKoreaknow. Avoid the following potential triggers: alcohol, tight clothing, NSAIDs, hot showers and getting overheated. See below for proper skin care. Take pictures of the rash. Keep track of episodes. Get bloodwork.  Other adverse food reactions, not elsewhere classified, subsequent encounter Currently avoiding tuna and declined in office food challenge in the past - had negative skin prick testing and bloodwork. Noticed shortness of breath x 2 after eating pineapple recently. Resolved within minutes. Some fresh fruits cause some itching. Today's skin prick testing was negative to foods. Continue to avoid foods that are  bothersome - tuna, pineapple.  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 This is caused by cross  reactivity of pollen with fresh fruits and vegetables, and nuts. Symptoms are usually localized in the form of itching and burning in mouth and throat. Very rarely it can progress to more severe symptoms. Eating foods in cooked or processed forms usually minimizes symptoms. I recommended avoidance of eating the problem foods, especially during the peak season(s). Sometimes, OFAS can induce severe throat swelling or even a systemic reaction; with such instance, I advised them to report to a local ER. A list of common pollens and food cross-reactivities was provided to the patient.   Other allergic rhinitis Asymptomatic with no medications. 2018 intradermal testing positive to grass, ragweed, weed, trees, mold, cat. Today's skin prick testing was negative to environmental allergy panel. Will get bloodwork instead of intradermal testing as I'm already ordering bloodwork for other reasons. Start environmental control measures as below. Use over the counter antihistamines such as Zyrtec (cetirizine), Claritin (loratadine), Allegra (fexofenadine), or Xyzal (levocetirizine) daily as needed. May switch antihistamines every few months.  Return in about 2 months (around 01/06/2021).  Meds ordered this encounter  Medications   cetirizine (ZYRTEC ALLERGY) 10 MG tablet    Sig: Take 1 tablet (10 mg total) by mouth 2 (two) times daily.    Dispense:  60 tablet    Refill:  5    Lab Orders  CBC with Differential/Platelet  Comprehensive metabolic panel  C-reactive protein  Tryptase  Sedimentation rate  Alpha-Gal Panel  ANA w/Reflex  Chronic Urticaria  Thyroid Cascade Profile  Allergen, Pineapple, f210  Allergens w/Total IgE Area 2    Other allergy screening: Asthma: no Medication allergy: yes Codeine - rash Hymenoptera allergy: no History of recurrent infections suggestive of immunodeficency: no  Diagnostics: Skin Testing: Environmental allergy panel and food panel. Negative to environmental  allergy panel, food panel. Results discussed with patient/family.  Airborne Adult Perc - 11/05/20 0859     Time Antigen Placed 0900    Allergen Manufacturer Lavella Hammock    Location Back    Number of Test 59    1. Control-Buffer 50% Glycerol Negative    2. Control-Histamine 1 mg/ml 2+    3. Albumin saline Negative    4. West Pelzer Negative    5. [place] Negative    6. Johnson Negative    7. Worden Blue Negative    8. Meadow Fescue Negative    9. Perennial Rye Negative    10. Sweet Vernal Negative    11. Timothy Negative    12. Cocklebur Negative    13. Burweed Marshelder Negative    14. Ragweed, short Negative    15. Ragweed, Giant Negative    16. Plantain,  English Negative    17. Lamb's Quarters Negative    18. Sheep Sorrell Negative    19. Rough Pigweed Negative    20. Marsh Elder, Rough Negative    21. Mugwort, Common Negative    22. Ash mix Negative    23. Birch mix Negative    24. Beech American Negative    25. Box, Elder Negative    26. Cedar, red Negative    27. Cottonwood, [place] Negative    28. Elm mix Negative    29. Hickory Negative    30. Maple mix Negative    31. Oak, [place] mix Negative    32. Pecan Pollen Negative  33. Pine mix Negative    34. Sycamore Eastern Negative    35. [place], Black Pollen Negative    36. Alternaria alternata Negative    37. Cladosporium Herbarum Negative    38. Aspergillus mix Negative    39. Penicillium mix Negative    40. Bipolaris sorokiniana (Helminthosporium) Negative    41. Drechslera spicifera (Curvularia) Negative    42. Mucor plumbeus Negative    43. Fusarium moniliforme Negative    44. Aureobasidium pullulans (pullulara) Negative    45. Rhizopus oryzae Negative    46. Botrytis cinera Negative    47. Epicoccum nigrum Negative    48. Phoma betae Negative    49. Candida Albicans Negative    50. Trichophyton mentagrophytes Negative    51. Mite, D Farinae  5,000 AU/ml Negative    52. Mite, D Pteronyssinus  5,000 AU/ml  Negative    53. Cat Hair 10,000 BAU/ml Negative    54.  Dog Epithelia Negative    55. Mixed Feathers Negative    56. Horse Epithelia Negative    57. Cockroach, German Negative    58. Mouse Negative    59. Tobacco Leaf Negative             Food Adult Perc - 11/05/20 0800     Time Antigen Placed 0900    Allergen Manufacturer Lavella Hammock    Location Back    Number of allergen test 74    1. Peanut Negative    2. Soybean Negative    3. Wheat Negative    4. Sesame Negative    5. Milk, cow Negative    6. Egg White, Chicken Negative    7. Casein Negative    8. Shellfish Mix Negative    9. Fish Mix Negative    10. Cashew Negative    11. Pecan Food Negative    12. [place] Negative    13. Almond Negative    14. Hazelnut Negative    15. [place] nut Negative    16. Coconut Negative    17. Pistachio Negative    18. Catfish Negative    19. Bass Negative    20. Trout Negative    21. Tuna Negative    22. Salmon Negative    23. Flounder Negative    24. Codfish Negative    25. Shrimp Negative    26. Crab Negative    27. Lobster Negative    28. Oyster Negative    29. Scallops Negative    30. Barley Negative    31. Oat  Negative    32. Rye  Negative    33. Hops Negative    34. Rice Negative    35. Cottonseed Negative    36. Saccharomyces Cerevisiae  Negative    37. Pork Negative    38. [place] Meat Negative    39. Chicken Meat Negative    40. Beef Negative    41. Lamb Negative    42. Tomato Negative    43. White Potato Negative    44. Sweet Potato Negative    45. Pea, Green/English Negative    46. Navy Bean Negative    47. Mushrooms Negative    48. Avocado Negative    49. Onion Negative    50. Cabbage Negative    51. Carrots Negative    52. Celery Negative    53. Corn Negative    54. Cucumber Negative    55. Grape (White seedless) Negative    56.  Orange  Negative    57. Banana Negative    58. Apple Negative    59. Peach Negative    60. Strawberry Negative    61.  Cantaloupe Negative    62. Watermelon Negative    63. Pineapple Negative    64. Chocolate/Cacao bean Negative    65. Karaya Gum Negative    66. Acacia (Arabic Gum) Negative    67. Cinnamon Negative    68. Nutmeg Negative    69. Ginger Negative    70. Garlic Negative    71. Pepper, black Negative    72. Mustard Negative    2. 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   History of pulmonary embolism 05/25/2017   Esophageal dysphagia    Family history of colon cancer    Macromastia 02/27/2015   Chronic neck and back pain 02/27/2015   Medication management 08/22/2014   Chronic anticoagulation 05/17/2014   Degenerative arthritis of left knee 10/06/2013   Status post total knee replacement 10/06/2013   Chronic pain 06/13/2013   Lap Sleeve Gastrectomy May 2014 09/16/2012   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   Nephrolithiasis 02/20/2011   Iron deficiency anemia 06/27/2010   Anxiety 06/27/2010   Migraine 09/12/2009   Morbid obesity-BMI 62 10/30/2008   Major depressive disorder, recurrent episode ([place]) 07/01/2006   HYPERTENSION, BENIGN SYSTEMIC 07/01/2006   Other allergic rhinitis 07/01/2006   Gastroesophageal reflux disease 07/01/2006   Past Medical History:  Diagnosis Date   Anemia    iron def   Anginal pain (HCC)    Anxiety    Arthritis    left knee    Back pain    Benign gastrointestinal stromal tumor (GIST)    Chronic knee pain    Depression    DVT (deep venous thrombosis) (HCC)    Esophageal dysmotility     GERD (gastroesophageal reflux disease)    Hiatal hernia    History of kidney stones    Hx of laparoscopic gastric banding    Hypertension    Internal hemorrhoids    Internal hemorrhoids    Migraine    Morbid obesity ([place])    PE (pulmonary embolism)    Past Surgical History: Past Surgical History:  Procedure Laterality Date   1 HOUR [place] STUDY N/A 05/13/2016   Procedure: 24 HOUR [place] STUDY;  Surgeon: Manus Gunning, MD;  Location: [facility];  Service: Gastroenterology;  Laterality: N/A;   ABDOMINAL HYSTERECTOMY     BIOPSY  02/16/2012   Procedure: BIOPSY;  Surgeon: [name], MD;  Location: [facility];  Service: General;;  biopsy of mass x 2   BREATH TEK H PYLORI  07/28/2011   Procedure: BREATH TEK H PYLORI;  Surgeon: [name], MD;  Location: [facility];  Service: General;  Laterality: N/A;  to be done at [facility]  05/1991   COLONOSCOPY WITH PROPOFOL N/A 03/03/2016   Procedure: COLONOSCOPY WITH PROPOFOL;  Surgeon: Manus Gunning, MD;  [facility]: [facility];  Service: Gastroenterology;  Laterality: N/A;   ESOPHAGEAL MANOMETRY N/A 05/13/2016   Procedure: ESOPHAGEAL MANOMETRY (EM);  Surgeon: Manus Gunning, MD;  [facility];  Service: Gastroenterology;  Laterality: N/A;   ESOPHAGOGASTRODUODENOSCOPY N/A 08/29/2012   Procedure: ESOPHAGOGASTRODUODENOSCOPY (EGD);  Surgeon: [name], MD;  [facility];  Service: General;  Laterality: N/A;   ESOPHAGOGASTRODUODENOSCOPY N/A 09/04/2014   Procedure: ESOPHAGOGASTRODUODENOSCOPY (EGD);  Surgeon: Alphonsa Overall, MD;  Location: [facility];  Service: General;  Laterality: N/A;   ESOPHAGOGASTRODUODENOSCOPY (EGD) WITH PROPOFOL N/A 03/03/2016   Procedure: ESOPHAGOGASTRODUODENOSCOPY (EGD) WITH PROPOFOL;  Surgeon: Manus Gunning, MD;  [facility];  Service: Gastroenterology;  Laterality: N/A;   EUS  03/03/2012   Procedure: UPPER ENDOSCOPIC ULTRASOUND (EUS) LINEAR;   Surgeon: Milus Banister, MD;  [facility];  Service: Endoscopy;  Laterality: N/A;   GASTRIC ROUX-EN-Y N/A 12/07/2017   Procedure: LAPAROSCOPIC ROUX-EN-Y GASTRIC BYPASS WITH LYSIS OF ADHESIONS AND UPPER ENDOSCOPY ERAS Pathway;  Surgeon: Johnathan Hausen, MD;  Location: [facility];  Service: General;  Laterality: N/A;   gist     KNEE ARTHROSCOPY     LAPAROSCOPIC GASTRECTOMY  04/08/2012   Procedure: LAPAROSCOPIC GASTRECTOMY;  Surgeon: [name], MD;  Location[facility];  Service: General;;  removal of GIST tumor of stomach   LAPAROSCOPIC GASTRIC SLEEVE RESECTION N/A 08/29/2012   Procedure: LAPAROSCOPIC GASTRIC SLEEVE RESECTION;  Surgeon: [name], MD;  Location: [facility];  Service: General;  Laterality: N/A;  Sleeve Gastrectomy   LEFT HEART CATH AND CORONARY ANGIOGRAPHY N/A 05/28/2017   Procedure: LEFT HEART CATH AND CORONARY ANGIOGRAPHY;  Surgeon: Leonie Man, MD;  Location: [facility];  Service: Cardiovascular;  Laterality: N/A;   ROUX-EN-Y GASTRIC BYPASS     TOTAL KNEE ARTHROPLASTY Left 10/06/2013   Procedure: LEFT TOTAL KNEE ARTHROPLASTY;  Surgeon: Mcarthur Rossetti, MD;  Location: [facility];  Service: Orthopedics;  Laterality: Left;   TUBAL LIGATION     Medication List:  Current Outpatient Medications  Medication Sig Dispense Refill   ALPRAZolam (XANAX) 1 MG tablet Take by mouth one in AM one at midday and 2 at bedtime as needed for anxiety 120 tablet 5   cetirizine (ZYRTEC ALLERGY) 10 MG tablet Take 1 tablet (10 mg total) by mouth 2 (two) times daily. 60 tablet 5   dexlansoprazole (DEXILANT) 60 MG capsule Take 1 capsule (60 mg total) by mouth daily. 90 capsule 3   linaclotide (LINZESS) 145 MCG CAPS capsule Take 1 capsule (145 mcg total) by mouth daily before breakfast. 90 capsule 0   lisinopril (ZESTRIL) 10 MG tablet TAKE 1 TABLET EACH DAY. 90 tablet 3   ondansetron (ZOFRAN-ODT) 4 MG disintegrating tablet Take 1 tablet (4 mg total) by mouth 2 (two) times daily as  needed for nausea or vomiting. 60 tablet 3   oxyCODONE-acetaminophen (PERCOCET) 10-325 MG tablet TAKE ONE TABLET EVERY SIX HOURS AS NEEDED FOR CHRONIC PAIN. 120 tablet 0   solifenacin (VESICARE) 5 MG tablet Take 5 mg by mouth daily.     XARELTO 10 MG TABS tablet TAKE 1 TABLET ONCE DAILY. 90 tablet 3   DEXILANT 60 MG capsule TAKE ONE CAPSULE DAILY. 90 capsule 0   No current facility-administered medications for this visit.   Allergies: Allergies  Allergen Reactions   Codeine Rash   Geralyn Flash [Fish Allergy] Rash   Social History: Social History   Socioeconomic History   Marital status: Divorced    Spouse name: Not on file  Number of children: 3   Years of education: 12   Highest education level: Not on file  Occupational History   Not on file  Tobacco Use   Smoking status: Never   Smokeless tobacco: Never  Vaping Use   Vaping Use: Never used  Substance and Sexual Activity   Alcohol use: No    Alcohol/week: 0.0 standard drinks   Drug use: No   Sexual activity: Yes    Birth control/protection: Surgical  Other Topics Concern   Not on file  Social History Narrative   Patient has been on disability since 2004 for knee pain, back pain, depression and acid reflux. She does work part-time now as Quarry manager.      She is caring for her granddaughter Amia age 19 (has sickle cell, goes to [facility] every month and has had multiple hospitalizations).       Patient enjoys going on walks, going to the beach, and cooking.    Social Determinants of Health   Financial Resource Strain: Low Risk    Difficulty of Paying Living Expenses: Not hard at all  Food Insecurity: No Food Insecurity   Worried About Charity fundraiser in the Last Year: Never true   [place] in the Last Year: Never true  Transportation Needs: No Transportation Needs   Lack of Transportation (Medical): No   Lack of Transportation (Non-Medical): No  Physical Activity: Sufficiently Active   Days of Exercise per Week: 4 days    Minutes of Exercise per Session: 40 min  Stress: Stress Concern Present   Feeling of Stress : Very much  Social Connections: Moderately Isolated   Frequency of Communication with Friends and Family: More than three times a week   Frequency of Social Gatherings with Friends and Family: More than three times a week   Attends Religious Services: More than 4 times per year   Active Member of [employer] or Organizations: No   Attends Archivist Meetings: Never   Marital Status: Divorced   Lives in a 52 year old house. Smoking: denies Occupation: Proofreader HistoryFreight forwarder in the house: no Charity fundraiser in the family room: yes Carpet in the bedroom: yes Heating: electric Cooling: central Pet: yes [place] pigs  - 1x 4 yrs  Family History: Family History  Problem Relation Age of Onset   Diabetes Mother    Diabetes Father    Heart disease Father    Colon cancer Father        brain tumor   Colon polyps Father    Colitis Father    Irritable bowel syndrome Father    Diabetes Maternal Grandmother    Diabetes Maternal Grandfather    Schizophrenia Maternal Grandfather    Heart disease Paternal Uncle    Diabetes Paternal Uncle    Heart disease Paternal Grandmother    Heart disease Paternal Grandfather    Diabetes Maternal Aunt    Diabetes Maternal Uncle    Diabetes Paternal Aunt    Cancer Other    Hypertension Other    Esophageal cancer Neg Hx    Problem                               Relation Asthma  No  Eczema                                Grand daughter Food allergy                          No  Allergic rhino conjunctivitis     No  Review of Systems  Constitutional:  Negative for appetite change, chills, fever and unexpected weight change.  HENT:  Negative for congestion and rhinorrhea.   Eyes:  Negative for itching.  Respiratory:  Negative for cough, chest tightness, shortness of breath and wheezing.    Cardiovascular:  Negative for chest pain.  Gastrointestinal:  Negative for abdominal pain.  Genitourinary:  Negative for difficulty urinating.  Skin:  Positive for rash.  Neurological:  Negative for headaches.   Objective: BP 128/78   Pulse 89   Temp 98 F (36.7 C) (Temporal)   Resp 18   Ht '5\' 7"'  (1.702 m)   Wt 289 lb (131.1 kg)   SpO2 98%   BMI 45.26 kg/m  Body mass index is 45.26 kg/m. Physical Exam Vitals and nursing note reviewed.  Constitutional:      Appearance: Normal appearance. She is well-developed.  HENT:     Head: Normocephalic and atraumatic.     Right Ear: External ear normal.     Left Ear: External ear normal.     Nose: Nose normal.     Mouth/Throat:     Mouth: Mucous membranes are moist.     Pharynx: Oropharynx is clear.  Eyes:     Conjunctiva/sclera: Conjunctivae normal.  Cardiovascular:     Rate and Rhythm: Normal rate and regular rhythm.     Heart sounds: Normal heart sounds. No murmur heard.   No friction rub. No gallop.  Pulmonary:     Effort: Pulmonary effort is normal.     Breath sounds: Normal breath sounds. No wheezing, rhonchi or rales.  Abdominal:     Palpations: Abdomen is soft.  Musculoskeletal:     Cervical back: Neck supple.  Skin:    General: Skin is warm.     Findings: No rash.  Neurological:     Mental Status: She is alert and oriented to person, place, and time.  Psychiatric:        Behavior: Behavior normal.   The plan was reviewed with the patient/family, and all questions/concerned were addressed.  It was my pleasure to see Crystal Garza today and participate in her care. Please feel free to contact me with any questions or concerns.  Sincerely,  Rexene Alberts, DO Allergy & Immunology  [facility] office: [place] office: [phone]

## 2020-11-05 NOTE — Assessment & Plan Note (Addendum)
Breaking out in rash for the past 4 years about 2 times per week. No change since onset. Outbreaks last 1-2 days. No specific triggers noted but concerned about developing new allergies. 2018 intradermal testing positive to grass, ragweed, weed, trees, mold, cat; 2018 skin prick testing negative to select foods. 2018 bloodwork - negative to seafood panel, CU index, tryptase, ANA, ESR, coffee. Elevated crp. Tried zyrtec and benadryl with unknown benefit.  Today's skin testing showed: Negative to environmental allergy panel, food panel.  Given above clinical history and test results, not sure what's causing/triggering the rash.  Start zyrtec (cetirizine) 61m twice a day.  If rash/itching is not controlled or causes drowsiness let uKoreaknow. . Avoid the following potential triggers: alcohol, tight clothing, NSAIDs, hot showers and getting overheated. . See below for proper skin care. . Take pictures of the rash. .Marland KitchenKeep track of episodes. . Get bloodwork.

## 2020-11-05 NOTE — Patient Instructions (Addendum)
Today's skin testing showed: Negative to environmental allergy panel, food panel. Results given.  Rash:  Start zyrtec (cetirizine) 10mg  twice a day. If rash/itching is not controlled or causes drowsiness let us know. Avoid the following potential triggers: alcohol, tight clothing, NSAIDs, hot showers and getting overheated. See below for proper skin care.  Take pictures of the rash. Keep track of episodes. Get bloodwork:  We are ordering labs, so please allow 1-2 weeks for the results to come back. With the newly implemented Cures Act, the labs might be visible to you at the same time that they become visible to me. However, I will not address the results until all of the results are back, so please be patient.   Environmental allergies: Start environmental control measures as below. Use over the counter antihistamines such as Zyrtec (cetirizine), Claritin (loratadine), Allegra (fexofenadine), or Xyzal (levocetirizine) daily as needed. May switch antihistamines every few months.  Food: Continue to avoid foods that are bothersome - tuna, pineapple.   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 This is caused by cross reactivity of pollen with fresh fruits and vegetables, and nuts. Symptoms are usually localized in the form of itching and burning in mouth and throat. Very rarely it can progress to more severe symptoms. Eating foods in cooked or processed forms usually minimizes symptoms. I recommended avoidance of eating the problem foods, especially during the peak season(s). Sometimes, OFAS can induce severe throat swelling or even a systemic reaction; with such instance, I advised them to report to a local ER. A list of common pollens and food cross-reactivities was provided to the patient.   Follow up in 2 months or sooner if needed.    Skin care recommendations  Bath time: Always use lukewarm water. AVOID very  hot or cold water. Keep bathing time to 5-10 minutes. Do NOT use bubble bath. Use a mild soap and use just enough to wash the dirty areas. Do NOT scrub skin vigorously.  After bathing, pat dry your skin with a towel. Do NOT rub or scrub the skin.  Moisturizers and prescriptions:  ALWAYS apply moisturizers immediately after bathing (within 3 minutes). This helps to lock-in moisture. Use the moisturizer several times a day over the whole body. Good summer moisturizers include: Aveeno, CeraVe, Cetaphil. Good winter moisturizers include: Aquaphor, Vaseline, Cerave, Cetaphil, Eucerin, Vanicream. When using moisturizers along with medications, the moisturizer should be applied about one hour after applying the medication to prevent diluting effect of the medication or moisturize around where you applied the medications. When not using medications, the moisturizer can be continued twice daily as maintenance.  Laundry and clothing: Avoid laundry products with added color or perfumes. Use unscented hypo-allergenic laundry products such as Tide free, Cheer free & gentle, and All free and clear.  If the skin still seems dry or sensitive, you can try double-rinsing the clothes. Avoid tight or scratchy clothing such as wool. Do not use fabric softeners or dyer sheets.  Reducing Pollen Exposure Pollen seasons: trees (spring), grass (summer) and ragweed/weeds (fall). Keep windows closed in your home and car to lower pollen exposure.  Install air conditioning in the bedroom and throughout the house if possible.  Avoid going out in dry windy days - especially early morning. Pollen counts are highest between 5 - 10 AM and on dry, hot and windy days.  Save outside activities for late afternoon or after a heavy rain, when pollen  levels are lower.  Avoid mowing of grass if you have grass pollen allergy. Be aware that pollen can also be transported indoors on people and pets.  Dry your clothes in an  automatic dryer rather than hanging them outside where they might collect pollen.  Rinse hair and eyes before bedtime.

## 2020-11-05 NOTE — Assessment & Plan Note (Signed)
Currently avoiding tuna and declined in office food challenge in the past - had negative skin prick testing and bloodwork. Noticed shortness of breath x 2 after eating pineapple recently. Resolved within minutes. Some fresh fruits cause some itching.  Today's skin prick testing was negative to foods. . Continue to avoid foods that are bothersome - tuna, pineapple.  . 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 This is caused by cross reactivity of pollen with fresh fruits and vegetables, and nuts. Symptoms are usually localized in the form of itching and burning in mouth and throat. Very rarely it can progress to more severe symptoms. Eating foods in cooked or processed forms usually minimizes symptoms. I recommended avoidance of eating the problem foods, especially during the peak season(s). Sometimes, OFAS can induce severe throat swelling or even a systemic reaction; with such instance, I advised them to report to a local ER. A list of common pollens and food cross-reactivities was provided to the patient.

## 2020-11-05 NOTE — Assessment & Plan Note (Signed)
Asymptomatic with no medications. 2018 intradermal testing positive to grass, ragweed, weed, trees, mold, cat.  Today's skin prick testing was negative to environmental allergy panel.  Will get bloodwork instead of intradermal testing as I'm already ordering bloodwork for other reasons.  Start environmental control measures as below.  Use over the counter antihistamines such as Zyrtec (cetirizine), Claritin (loratadine), Allegra (fexofenadine), or Xyzal (levocetirizine) daily as needed. May switch antihistamines every few months.

## 2020-11-06 ENCOUNTER — Other Ambulatory Visit: Payer: Self-pay | Admitting: Family Medicine

## 2020-11-08 MED ORDER — OXYCODONE-ACETAMINOPHEN 10-325 MG PO TABS: ORAL_TABLET | ORAL | 0 refills | Status: DC

## 2020-11-13 ENCOUNTER — Other Ambulatory Visit (INDEPENDENT_AMBULATORY_CARE_PROVIDER_SITE_OTHER): Payer: Medicare Other

## 2020-11-13 ENCOUNTER — Encounter: Payer: Self-pay | Admitting: Gastroenterology

## 2020-11-13 ENCOUNTER — Telehealth: Payer: Self-pay

## 2020-11-13 ENCOUNTER — Ambulatory Visit (INDEPENDENT_AMBULATORY_CARE_PROVIDER_SITE_OTHER): Payer: Medicare Other | Admitting: Gastroenterology

## 2020-11-13 VITALS — BP 124/70 | HR 68 | Ht 67.0 in | Wt 289.0 lb

## 2020-11-13 DIAGNOSIS — R109 Unspecified abdominal pain: Secondary | ICD-10-CM

## 2020-11-13 DIAGNOSIS — T781XXD Other adverse food reactions, not elsewhere classified, subsequent encounter: Secondary | ICD-10-CM | POA: Diagnosis not present

## 2020-11-13 DIAGNOSIS — K5909 Other constipation: Secondary | ICD-10-CM

## 2020-11-13 DIAGNOSIS — L299 Pruritus, unspecified: Secondary | ICD-10-CM | POA: Diagnosis not present

## 2020-11-13 DIAGNOSIS — Z800 Family history of malignant neoplasm of digestive organs: Secondary | ICD-10-CM

## 2020-11-13 DIAGNOSIS — Z9884 Bariatric surgery status: Secondary | ICD-10-CM

## 2020-11-13 DIAGNOSIS — R21 Rash and other nonspecific skin eruption: Secondary | ICD-10-CM | POA: Diagnosis not present

## 2020-11-13 DIAGNOSIS — K219 Gastro-esophageal reflux disease without esophagitis: Secondary | ICD-10-CM

## 2020-11-13 DIAGNOSIS — J3089 Other allergic rhinitis: Secondary | ICD-10-CM | POA: Diagnosis not present

## 2020-11-13 LAB — CBC WITH DIFFERENTIAL/PLATELET
Basophils Absolute: 0.1 10*3/uL (ref 0.0–0.1)
Basophils Relative: 1.2 % (ref 0.0–3.0)
Eosinophils Absolute: 0.1 10*3/uL (ref 0.0–0.7)
Eosinophils Relative: 1.2 % (ref 0.0–5.0)
HCT: 40.2 % (ref 36.0–46.0)
Hemoglobin: 13.2 g/dL (ref 12.0–15.0)
Lymphocytes Relative: 37.5 % (ref 12.0–46.0)
Lymphs Abs: 3.1 10*3/uL (ref 0.7–4.0)
MCHC: 32.8 g/dL (ref 30.0–36.0)
MCV: 84.2 fl (ref 78.0–100.0)
Monocytes Absolute: 0.5 10*3/uL (ref 0.1–1.0)
Monocytes Relative: 5.8 % (ref 3.0–12.0)
Neutro Abs: 4.4 10*3/uL (ref 1.4–7.7)
Neutrophils Relative %: 54.3 % (ref 43.0–77.0)
Platelets: 285 10*3/uL (ref 150.0–400.0)
RBC: 4.77 Mil/uL (ref 3.87–5.11)
RDW: 14.6 % (ref 11.5–15.5)
WBC: 8.2 10*3/uL (ref 4.0–10.5)

## 2020-11-13 LAB — HEPATIC FUNCTION PANEL
ALT: 10 U/L (ref 0–35)
AST: 17 U/L (ref 0–37)
Albumin: 4 g/dL (ref 3.5–5.2)
Alkaline Phosphatase: 77 U/L (ref 39–117)
Bilirubin, Direct: 0.1 mg/dL (ref 0.0–0.3)
Total Bilirubin: 0.5 mg/dL (ref 0.2–1.2)
Total Protein: 7.6 g/dL (ref 6.0–8.3)

## 2020-11-13 LAB — LIPASE: Lipase: 32 U/L (ref 11.0–59.0)

## 2020-11-13 MED ORDER — SUTAB 1479-225-188 MG PO TABS: 1.0000 | ORAL_TABLET | Freq: Once | ORAL | 0 refills | Status: AC

## 2020-11-13 MED ORDER — DEXLANSOPRAZOLE 60 MG PO CPDR: 60.0000 mg | DELAYED_RELEASE_CAPSULE | Freq: Every day | ORAL | 3 refills | Status: DC

## 2020-11-13 MED ORDER — DICYCLOMINE HCL 10 MG PO CAPS: 10.0000 mg | ORAL_CAPSULE | Freq: Three times a day (TID) | ORAL | 3 refills | Status: DC | PRN

## 2020-11-13 NOTE — Patient Instructions (Addendum)
If you are age 52 or older, your body mass index should be between 23-30. Your Body mass index is 45.26 kg/m. If this is out of the aforementioned range listed, please consider follow up with your Primary Care Provider.  If you are age 71 or younger, your body mass index should be between 19-25. Your Body mass index is 45.26 kg/m. If this is out of the aformentioned range listed, please consider follow up with your Primary Care Provider.   __________________________________________________________  The [facility] providers would like to encourage you to use [facility] to communicate with providers for non-urgent requests or questions.  Due to long hold times on the telephone, sending your provider a message by [facility] may be a faster and more efficient way to get a response.  Please allow 48 business hours for a response.  Please remember that this is for non-urgent requests.   You have been scheduled for an endoscopy and colonoscopy. Please follow the written instructions given to you at your visit today. Please pick up your prep supplies at the pharmacy within the next 1-3 days. If you use inhalers (even only as needed), please bring them with you on the day of your procedure.  You will be contacted by our office prior to your procedure for directions on holding your Xarelto.  If you do not hear from our office 1 week prior to your scheduled procedure, please call [phone] to discuss.   Please go to the lab in the basement of our building to have lab work done as you leave today. Hit "B" for basement when you get on the elevator.  When the doors open the lab is on your left.  We will call you with the results. Thank you.  You will be contacted by [facility] in the next 2 days to arrange a abdominal ultrasound.  The number on your caller ID will be [phone], please answer when they call.  If you have not heard from them in 2 days please call [phone] to schedule.     Continue Dexilant - crack capsule open and ingest contents  Continue Linzess daily before breakfast  We have sent the following medications to your pharmacy for you to pick up at your convenience: Bentyl 10 mg: Take every 8 hours as needed  Thank you for entrusting me with your care and for choosing [employer], Dr. Milbank Cellar

## 2020-11-13 NOTE — Progress Notes (Signed)
HPI :  50 year old female here for a follow-up visit.  I have seen her in the past for dysphagia and reflux for which she has undergone an extensive evaluation as outlined below.  She is here today for complaints of abdominal pain.   She states for the past 3 weeks she has had intermittent pain in her upper to mid abdomen.  Symptoms are intermittent, they come and go.  She states it feels like a "twisting" sensation in her abdomen this occurs.  Eating can seem to make her symptoms worse although she can also have it when she is not eating.  Her gallbladder is in place.  She has had some nausea with this but no vomiting.  She is taking Zofran which can help.  She has been taking Dexilant for history of reflux and states this appears to be working for her.  She is not using any NSAIDs.  She does continue to take Xarelto.  Her last EGD with me was in 2017 at which point she had gastric sleeve anatomy.  She had some mild esophagitis at that time.  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  She has not had an EGD since that time.  She had a CT scan in June which showed postsurgical anatomy but no clear cause for her symptoms.  She did have hepatic steatosis noted.  Her liver enzymes are normal  She otherwise has followed with me for chronic constipation and has been on a variety of regimens for this in the past.  She does take Percocet chronically for orthopedic pain.  Most recently she has been on Linzess 290 mcg/day and this has worked well to control her symptoms.  If she does not take anything she will have a difficult time moving her bowels.  No blood in her stools.  Her father had colon cancer at age 31.  She had a colonoscopy with me in 02/2016, she did not have any polyps noted at that time.  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     Prior workup: EGD 2017 showed mild esophagitis at the GEJ, 2cm hiatal hernia. with evidence of gastric sleeve surgery. Biopsies negative for H pylori.  She transitioned to Dexilant 27m daily.  She has been on Dexilant since then.     Results of manometry and 24 Hr pH impedance studies Esophageal manometry - quality of study is decreased by double swallows - normal resting LES pressure with complete relaxation - 100% of swallows had peristaltic contractions, but 50% of swallows had large breaks in peristalsis   Overall, suboptimal study due to double swallows however fragmented peristalsis noted with some poor bolus clearance, but no other major abnormality, no achalasia   24 HR pH impedance study: Study performed ON PPI (Dexilant)   Results: - Study time of 24 hrs: adequate study time - Demeester score of 5.5 (14.72 = upper limit of normal) - no evidence of pathologic reflux - Symptom index of only 33% (3/9) for acid reflux episodes, and 44% (4/9) for all reflux episodes - most reflux episodes that occurred were weakly acidic (32/42)   Summary and Interpretation: - study consistent with controlled acid reflux disease on PPI - symptoms reported did NOT correlate well with reflux episodes - there could be a component of functional heartburn to symptoms in addition to nonspecific changes noted on manometry     Colonoscopy 03/03/2016 - normal exam, no polyps, recommended a repeat colonoscopy in 5 years given  strong FH of colon cancer.     CT scan 10/28/20 - IMPRESSION: 1. Postsurgical changes of Roux-en-Y gastric bypass and prior sleeve gastrectomy without evidence of complication. 2. Tiny fat containing umbilical hernia, unchanged from prior CT. 3. Mild hepatomegaly with diffuse hepatic steatosis. 4.  Aortic Atherosclerosis (ICD10-I70.0).      Past Medical History:  Diagnosis Date   Anemia    iron def   Anginal pain (HCC)    Anxiety    Arthritis    left knee    Back pain    Benign gastrointestinal stromal tumor (GIST)    Chronic knee pain    Depression    DVT (deep venous thrombosis) (HCC)    Esophageal dysmotility     GERD (gastroesophageal reflux disease)    Hiatal hernia    History of kidney stones    Hx of laparoscopic gastric banding    Hypertension    Internal hemorrhoids    Internal hemorrhoids    Migraine    Morbid obesity ([place])    PE (pulmonary embolism)      Past Surgical History:  Procedure Laterality Date   3 HOUR [place] STUDY N/A 05/13/2016   Procedure: 24 HOUR [place] STUDY;  Surgeon: Manus Gunning, MD;  Location: [facility];  Service: Gastroenterology;  Laterality: N/A;   ABDOMINAL HYSTERECTOMY     BIOPSY  02/16/2012   Procedure: BIOPSY;  Surgeon: [name], MD;  Location: [facility];  Service: General;;  biopsy of mass x 2   BREATH TEK H PYLORI  07/28/2011   Procedure: BREATH TEK H PYLORI;  Surgeon: [name], MD;  Location: [facility];  Service: General;  Laterality: N/A;  to be done at [facility]  05/1991   COLONOSCOPY WITH PROPOFOL N/A 03/03/2016   Procedure: COLONOSCOPY WITH PROPOFOL;  Surgeon: Manus Gunning, MD;  Location: [facility];  Service: Gastroenterology;  Laterality: N/A;   ESOPHAGEAL MANOMETRY N/A 05/13/2016   Procedure: ESOPHAGEAL MANOMETRY (EM);  Surgeon: Manus Gunning, MD;  [facility];  Service: Gastroenterology;  Laterality: N/A;   ESOPHAGOGASTRODUODENOSCOPY N/A 08/29/2012   Procedure: ESOPHAGOGASTRODUODENOSCOPY (EGD);  Surgeon: [name], MD;  [facility]: [facility];  Service: General;  Laterality: N/A;   ESOPHAGOGASTRODUODENOSCOPY N/A 09/04/2014   Procedure: ESOPHAGOGASTRODUODENOSCOPY (EGD);  Surgeon: Alphonsa Overall, MD;  Location: [facility];  Service: General;  Laterality: N/A;   ESOPHAGOGASTRODUODENOSCOPY (EGD) WITH PROPOFOL N/A 03/03/2016   Procedure: ESOPHAGOGASTRODUODENOSCOPY (EGD) WITH PROPOFOL;  Surgeon: Manus Gunning, MD;  [facility];  Service: Gastroenterology;  Laterality: N/A;   EUS  03/03/2012   Procedure: UPPER ENDOSCOPIC ULTRASOUND (EUS) LINEAR;  Surgeon: Milus Banister,  MD;  Location: [facility];  Service: Endoscopy;  Laterality: N/A;   GASTRIC ROUX-EN-Y N/A 12/07/2017   Procedure: LAPAROSCOPIC ROUX-EN-Y GASTRIC BYPASS WITH LYSIS OF ADHESIONS AND UPPER ENDOSCOPY ERAS Pathway;  Surgeon: Johnathan Hausen, MD;  Location: [facility];  Service: General;  Laterality: N/A;   gist     KNEE ARTHROSCOPY     LAPAROSCOPIC GASTRECTOMY  04/08/2012   Procedure: LAPAROSCOPIC GASTRECTOMY;  Surgeon: [name], MD;  Location: [facility];  Service: General;;  removal of GIST tumor of stomach   LAPAROSCOPIC GASTRIC SLEEVE RESECTION N/A 08/29/2012   Procedure: LAPAROSCOPIC GASTRIC SLEEVE RESECTION;  Surgeon: [name], MD;  Location: [facility];  Service: General;  Laterality: N/A;  Sleeve Gastrectomy   LEFT HEART CATH AND CORONARY ANGIOGRAPHY N/A 05/28/2017   Procedure: LEFT HEART CATH  AND CORONARY ANGIOGRAPHY;  Surgeon: Leonie Man, MD;  Location: [facility];  Service: Cardiovascular;  Laterality: N/A;   ROUX-EN-Y GASTRIC BYPASS     TOTAL KNEE ARTHROPLASTY Left 10/06/2013   Procedure: LEFT TOTAL KNEE ARTHROPLASTY;  Surgeon: Mcarthur Rossetti, MD;  Location: [facility];  Service: Orthopedics;  Laterality: Left;   TUBAL LIGATION     Family History  Problem Relation Age of Onset   Diabetes Mother    Diabetes Father    Heart disease Father    Colon cancer Father        brain tumor   Colon polyps Father    Colitis Father    Irritable bowel syndrome Father    Diabetes Maternal Grandmother    Diabetes Maternal Grandfather    Schizophrenia Maternal Grandfather    Heart disease Paternal Uncle    Diabetes Paternal Uncle    Heart disease Paternal Grandmother    Heart disease Paternal Grandfather    Diabetes Maternal Aunt    Diabetes Maternal Uncle    Diabetes Paternal Aunt    Cancer Other    Hypertension Other    Esophageal cancer Neg Hx    Social History   Tobacco Use   Smoking status: Never   Smokeless tobacco: Never  Vaping Use   Vaping Use: Never used   Substance Use Topics   Alcohol use: No    Alcohol/week: 0.0 standard drinks   Drug use: No   Current Outpatient Medications  Medication Sig Dispense Refill   ALPRAZolam (XANAX) 1 MG tablet Take by mouth one in AM one at midday and 2 at bedtime as needed for anxiety 120 tablet 5   cetirizine (ZYRTEC ALLERGY) 10 MG tablet Take 1 tablet (10 mg total) by mouth 2 (two) times daily. 60 tablet 5   dexlansoprazole (DEXILANT) 60 MG capsule Take 1 capsule (60 mg total) by mouth daily. 90 capsule 3   linaclotide (LINZESS) 145 MCG CAPS capsule Take 1 capsule (145 mcg total) by mouth daily before breakfast. 90 capsule 0   lisinopril (ZESTRIL) 10 MG tablet TAKE 1 TABLET EACH DAY. 90 tablet 3   ondansetron (ZOFRAN-ODT) 4 MG disintegrating tablet Take 1 tablet (4 mg total) by mouth 2 (two) times daily as needed for nausea or vomiting. 60 tablet 3   oxyCODONE-acetaminophen (PERCOCET) 10-325 MG tablet TAKE ONE TABLET EVERY SIX HOURS AS NEEDED FOR CHRONIC PAIN. 120 tablet 0   solifenacin (VESICARE) 5 MG tablet Take 5 mg by mouth daily.     XARELTO 10 MG TABS tablet TAKE 1 TABLET ONCE DAILY. 90 tablet 3   No current facility-administered medications for this visit.   Allergies  Allergen Reactions   Codeine Rash   Tuna [Fish Allergy] Rash     Review of Systems: All systems reviewed and negative except where noted in HPI.    CT ABDOMEN PELVIS W CONTRAST  Result Date: 10/28/2020 CLINICAL DATA:  Umbilical area outpouching x1 week assess for hernia EXAM: CT ABDOMEN AND PELVIS WITH CONTRAST TECHNIQUE: Multidetector CT imaging of the abdomen and pelvis was performed using the standard protocol following bolus administration of intravenous contrast. CONTRAST:  165m OMNIPAQUE IOHEXOL 300 MG/ML  SOLN COMPARISON:  CT November 17, 2016 FINDINGS: Lower chest: No acute abnormality. Normal size heart. No significant pericardial effusion/thickening. Hepatobiliary: Diffuse hepatic steatosis. Hepatomegaly measuring 18.3 cm  in maximum craniocaudal dimension. No focal hepatic lesion. Gallbladder is unremarkable. No biliary ductal dilation. Pancreas: Within normal limits. Spleen: Within normal limits.  Adrenals/Urinary Tract: Adrenal glands are unremarkable. Kidneys are normal, without renal calculi, focal lesion, or hydronephrosis. Bladder is unremarkable solid enhancing for degree of distension. Stomach/Bowel: Radiopaque enteric contrast traverses the ascending colon. Postsurgical changes of Roux-en-Y gastric bypass and prior sleeve gastrectomy without evidence of complication. No pathologic dilation of small bowel. The colon extending from the splenic flexure to the rectum is predominantly decompressed limiting evaluation. Vascular/Lymphatic: Aortic atherosclerosis without aneurysmal dilation No pathologically enlarged abdominal or pelvic lymph nodes. Reproductive: Status post hysterectomy. No adnexal masses. Other: No abdominopelvic ascites. Tiny fat containing umbilical hernia, unchanged from prior CT. Musculoskeletal: Lower thoracic and lower lumbar spondylosis. No acute osseous abnormality. IMPRESSION: 1. Postsurgical changes of Roux-en-Y gastric bypass and prior sleeve gastrectomy without evidence of complication. 2. Tiny fat containing umbilical hernia, unchanged from prior CT. 3. Mild hepatomegaly with diffuse hepatic steatosis. 4.  Aortic Atherosclerosis (ICD10-I70.0). Electronically Signed   By: Dahlia Bailiff MD   On: 10/28/2020 13:37    Physical Exam: BP 124/70   Pulse 68   Ht '5\' 7"'  (1.702 m)   Wt 289 lb (131.1 kg)   BMI 45.26 kg/m  Constitutional: Pleasant,well-developed, female in no acute distress. Abdominal: Soft, nondistended, protuberant, mild upper to mid abdomen TTP, no masses.   Extremities: no edema Lymphadenopathy: No cervical adenopathy noted. Neurological: Alert and oriented to person place and time. Skin: Skin is warm and dry. No rashes noted. Psychiatric: Normal mood and affect. Behavior is  normal.   ASSESSMENT AND PLAN: 50 year old female here for reassessment of the following:  Abdominal pain History of roux-en-Y gastric bypass Chronic constipation Family history of colon cancer GERD  As above, patient with few weeks worth of intermittent abdominal pain.  Sometimes preceded by eating but not always.  She had a CT scan which did not show any concerning pathology.  Pain is in upper mid abdomen as well as just above umbilicus.  Discussed options with her.  Given her Roux-en-Y gastric bypass anatomy I offered her an upper endoscopy as she has not been evaluated since that surgery, ensure no concerning pathology there.  I discussed risk benefits of anesthesia and endoscopy with her and she wants to proceed.  At the same time she is sedated for this offered her a colonoscopy in light of her family history of colon cancer she is due for exam in the next few months, she wanted to do this at the same time which is reasonable.  I will also reassess her gallbladder with a right upper quadrant ultrasound to rule out gallstones/biliary colic.  In the interim she can continue Dexilant but may want to try cracking open capsules prior to ingestion in light of her gastric bypass status to maximize absorption.  I will check a CBC, lipase, c-Met today to make sure stable.  Offered her a trial of empiric Bentyl in the interim to see if that will help at all.  She agreed with the plan  Plan: - schedule EGD / colonoscopy - we ask for approval to hold Xarelto for 2 days preprocedure - RUQ [name] - rule out gallstones - continue Dexilant, but crack capsule open prior to ingestion - NO NSAIDs - trial of Bentyl 89m every 8 hours PRN #30 RF3 - CBC, lipase, LFTs - continue Linzess  SCarolina Cellar MD [facility]

## 2020-11-13 NOTE — Telephone Encounter (Signed)
   MALINI FLEMINGS [birth year] [id number]  Dear Dr. Nori Riis:  We have scheduled the above named patient for a(n) EGD/Colonoscopy procedure. Our records show that (s)he is on anticoagulation therapy.  Please advise as to whether the patient may come off their therapy of Xarelto 2 days prior to their procedure which is scheduled for 12-23-20.  Please route your response to Lemar Lofty, CMA or fax response to [phone].  Sincerely,    [facility]

## 2020-11-14 NOTE — Telephone Encounter (Signed)
Anti coag clearance letter faxed to Dr. Nori Riis at [phone].

## 2020-11-18 MED ORDER — SUPREP BOWEL PREP KIT 17.5-3.13-1.6 GM/177ML PO SOLN: ORAL | 0 refills | Status: DC

## 2020-11-18 NOTE — Telephone Encounter (Signed)
Called and spoke to patient. Advised her to hold Xarelto on 8-20 and 8-21. She expressed understanding.  Patient indicated that Sutab at $40 is too expensive and asked about other options. She used Suprep last time and it was covered by his insurance. Suprep sent to pharmacy, [facility], [phone].  According to pharmacy, [place] will be $0.  Instructions re-printed for Suprep and mailed to patient.

## 2020-11-18 NOTE — Telephone Encounter (Signed)
Called patient and

## 2020-11-18 NOTE — Telephone Encounter (Signed)
Faxed request to corrected fax [phone]

## 2020-11-21 ENCOUNTER — Ambulatory Visit (HOSPITAL_COMMUNITY)
Admission: RE | Admit: 2020-11-21 | Discharge: 2020-11-21 | Disposition: A | Discharge status: Home / Self Care | Payer: Medicare Other | Source: Ambulatory Visit | Attending: Gastroenterology | Admitting: Gastroenterology

## 2020-11-21 ENCOUNTER — Other Ambulatory Visit: Payer: Self-pay

## 2020-11-21 DIAGNOSIS — R109 Unspecified abdominal pain: Secondary | ICD-10-CM | POA: Diagnosis not present

## 2020-11-21 DIAGNOSIS — K5909 Other constipation: Secondary | ICD-10-CM | POA: Diagnosis not present

## 2020-11-21 DIAGNOSIS — Z9884 Bariatric surgery status: Secondary | ICD-10-CM

## 2020-11-21 DIAGNOSIS — Z800 Family history of malignant neoplasm of digestive organs: Secondary | ICD-10-CM | POA: Insufficient documentation

## 2020-11-21 DIAGNOSIS — K219 Gastro-esophageal reflux disease without esophagitis: Secondary | ICD-10-CM | POA: Diagnosis not present

## 2020-11-22 LAB — ALLERGENS W/TOTAL IGE AREA 2

## 2020-11-22 LAB — CBC WITH DIFFERENTIAL/PLATELET
Basophils Absolute: 0.1 10*3/uL (ref 0.0–0.2)
Basos: 1 %
EOS (ABSOLUTE): 0.1 10*3/uL (ref 0.0–0.4)
Eos: 1 %
Hematocrit: 43.7 % (ref 34.0–46.6)
Hemoglobin: 13.8 g/dL (ref 11.1–15.9)
Immature Grans (Abs): 0 10*3/uL (ref 0.0–0.1)
Immature Granulocytes: 0 %
Lymphocytes Absolute: 3.5 10*3/uL — ABNORMAL HIGH (ref 0.7–3.1)
Lymphs: 39 %
MCH: 26.2 pg — ABNORMAL LOW (ref 26.6–33.0)
MCHC: 31.6 g/dL (ref 31.5–35.7)
MCV: 83 fL (ref 79–97)
Monocytes Absolute: 0.4 10*3/uL (ref 0.1–0.9)
Monocytes: 5 %
Neutrophils Absolute: 4.8 10*3/uL (ref 1.4–7.0)
Neutrophils: 54 %
Platelets: 436 10*3/uL (ref 150–450)
RBC: 5.26 x10E6/uL (ref 3.77–5.28)
RDW: 13.5 % (ref 11.7–15.4)
WBC: 8.9 10*3/uL (ref 3.4–10.8)

## 2020-11-22 LAB — COMPREHENSIVE METABOLIC PANEL
ALT: 9 IU/L (ref 0–32)
AST: 16 IU/L (ref 0–40)
Albumin/Globulin Ratio: 1.3 (ref 1.2–2.2)
Albumin: 4.3 g/dL (ref 3.8–4.9)
Alkaline Phosphatase: 97 IU/L (ref 44–121)
BUN/Creatinine Ratio: 11 (ref 9–23)
BUN: 8 mg/dL (ref 6–24)
Bilirubin Total: 0.3 mg/dL (ref 0.0–1.2)
CO2: 23 mmol/L (ref 20–29)
Calcium: 9.6 mg/dL (ref 8.7–10.2)
Chloride: 105 mmol/L (ref 96–106)
Creatinine, Ser: 0.71 mg/dL (ref 0.57–1.00)
Globulin, Total: 3.4 g/dL (ref 1.5–4.5)
Glucose: 82 mg/dL (ref 65–99)
Potassium: 4.1 mmol/L (ref 3.5–5.2)
Sodium: 143 mmol/L (ref 134–144)
Total Protein: 7.7 g/dL (ref 6.0–8.5)
eGFR: 102 mL/min/{1.73_m2} (ref >59)

## 2020-11-22 LAB — ALPHA-GAL PANEL
Allergen Lamb IgE: <0.1 kU/L
Beef IgE: <0.1 kU/L
IgE (Immunoglobulin E), Serum: 41 IU/mL (ref 6–495)
O215-IgE Alpha-Gal: <0.1 kU/L
Pork IgE: <0.1 kU/L

## 2020-11-22 LAB — TRYPTASE: Tryptase: 6 ug/L (ref 2.2–13.2)

## 2020-11-22 LAB — THYROID CASCADE PROFILE: TSH: 2.71 u[IU]/mL (ref 0.450–4.500)

## 2020-11-22 LAB — ALLERGEN, PINEAPPLE, F210: Pineapple IgE: <0.1 kU/L

## 2020-11-22 LAB — CHRONIC URTICARIA: cu index: 26.2 — ABNORMAL HIGH (ref <10)

## 2020-11-22 LAB — C-REACTIVE PROTEIN: CRP: 5 mg/L (ref 0–10)

## 2020-11-22 LAB — ANA W/REFLEX: Anti Nuclear Antibody (ANA): NEGATIVE

## 2020-11-22 LAB — SEDIMENTATION RATE: Sed Rate: 55 mm/hr — ABNORMAL HIGH (ref 0–40)

## 2020-11-25 ENCOUNTER — Encounter: Payer: Self-pay | Admitting: Family Medicine

## 2020-11-25 ENCOUNTER — Encounter: Payer: Self-pay | Admitting: Allergy

## 2020-11-25 ENCOUNTER — Telehealth: Payer: Self-pay

## 2020-11-25 MED ORDER — SUCRALFATE 1 GM/10ML PO SUSP: 1.0000 g | Freq: Four times a day (QID) | ORAL | 3 refills | Status: DC

## 2020-11-25 NOTE — Telephone Encounter (Signed)
-----   Message from Yetta Flock, MD sent at 11/25/2020 11:19 AM EDT ----- Can you you please order liquid carafate 10cc po every 6 hours for this patient. 1 bottle 3 RF. Thanks

## 2020-11-25 NOTE — Telephone Encounter (Signed)
Script sent to [facility]

## 2020-11-26 ENCOUNTER — Other Ambulatory Visit: Payer: Self-pay

## 2020-11-27 NOTE — Progress Notes (Signed)
Follow Up Note  RE: Crystal Garza MRN: [id number] DOB: [birth year] Date of Office Visit: 11/28/2020  Referring provider: Dickie La, MD Primary care provider: Dickie La, MD  Chief Complaint: Pruritus (Discuss lab results )  History of Present Illness: I had the pleasure of seeing Crystal Garza for a follow up visit at the [facility] on 11/28/2020. She is a 50 y.o. female, who is being followed for rash, adverse food reaction, chronic rhinitis. Her previous allergy office visit was on 11/05/2020 with Dr. Maudie Mercury. Today is a visit to discuss lab results.  Itching  Taking zyrtec 4m twice a day but still having itching. No rash though.  Moisturizing with Vaseline.   Concerned about the lab results and if she needs to have any additional work up.  Apparently patient's father had similar symptoms as herself and was found to have cancer. She is getting colonoscopy done and scheduling for mammogram.    Food Avoiding tuna and pineapple. Not interested in re-introduction of the tuna. Wants to try pineapple.   Assessment and Plan: Crystal Garza a 50y.o. female with: Pruritus Past history - Breaking out in rash for the past 4 years about 2 times per week. No change since onset. Outbreaks last 1-2 days. No specific triggers noted but concerned about developing new allergies. 2018 intradermal testing positive to grass, ragweed, weed, trees, mold, cat; 2018 skin prick testing negative to select foods. 2018 bloodwork - negative to seafood panel, CU index, tryptase, ANA, ESR, coffee. Elevated crp. Tried zyrtec and benadryl with unknown benefit. 2022 skin testing showed: Negative to environmental allergy panel, food panel. Interim history - rash resolved but still itching. Concerned about test results and wanted to discuss in more detail.  Reviewed lab results in detail with patient - CBC diff, CMP, crp, TSH, ANA, tryptase, alpha gal were all normal. ESR and CU elevated. CU index  identifies that patient is producing more histamine than normal but usually no allergic triggers associated with this - may be contributing to her itching. ESR is a nonspecific inflammatory marker and the rest of bloodwork was normal which is reassuring - will recheck ESR at next visit to make sure it's trending down.  Continue zyrtec (cetirizine) 132mtwice a day. If rash/itching is not controlled or causes drowsiness let usKoreanow. START famotidine 2051mwice a day. Avoid the following potential triggers: alcohol, tight clothing, NSAIDs, hot showers and getting overheated. Continue proper skin care - moisturize daily.  Take pictures of the rash. Keep track of episodes.  Other adverse food reactions, not elsewhere classified, subsequent encounter Past history - Currently avoiding tuna and declined in office food challenge in the past - had negative skin prick testing and bloodwork. Noticed shortness of breath x 2 after eating pineapple recently. Resolved within minutes. Some fresh fruits cause some itching. 2022 skin prick testing was negative to foods. Interim history - not interested in reintroducing tuna.  Continue to avoid foods that are bothersome - tuna. May try pineapple at home.   Chronic rhinitis Past history - Asymptomatic with no medications. 2018 intradermal testing positive to grass, ragweed, weed, trees, mold, cat. 2022 skin prick testing was negative to environmental allergy panel. Interim history - bloodwork negative to environmental allergy panel. Monitor symptoms.  Return in about 2 months (around 01/29/2021).  Follow up with GI regarding colonoscopy.   Meds ordered this encounter  Medications   famotidine (PEPCID) 20 MG tablet    Sig:  Take 1 tablet (20 mg total) by mouth 2 (two) times daily.    Dispense:  60 tablet    Refill:  3    Lab Orders  Sedimentation rate    Diagnostics: None.   Medication List:  Current Outpatient Medications  Medication Sig Dispense  Refill   ALPRAZolam (XANAX) 1 MG tablet Take by mouth one in AM one at midday and 2 at bedtime as needed for anxiety 120 tablet 5   cetirizine (ZYRTEC ALLERGY) 10 MG tablet Take 1 tablet (10 mg total) by mouth 2 (two) times daily. 60 tablet 5   dexlansoprazole (DEXILANT) 60 MG capsule Take 1 capsule (60 mg total) by mouth daily. Crack open capsule before ingestion 90 capsule 3   dicyclomine (BENTYL) 10 MG capsule Take 1 capsule (10 mg total) by mouth every 8 (eight) hours as needed for spasms. 30 capsule 3   famotidine (PEPCID) 20 MG tablet Take 1 tablet (20 mg total) by mouth 2 (two) times daily. 60 tablet 3   linaclotide (LINZESS) 145 MCG CAPS capsule Take 1 capsule (145 mcg total) by mouth daily before breakfast. 90 capsule 0   lisinopril (ZESTRIL) 10 MG tablet TAKE 1 TABLET EACH DAY. 90 tablet 3   ondansetron (ZOFRAN-ODT) 4 MG disintegrating tablet Take 1 tablet (4 mg total) by mouth 2 (two) times daily as needed for nausea or vomiting. 60 tablet 3   oxyCODONE-acetaminophen (PERCOCET) 10-325 MG tablet TAKE ONE TABLET EVERY SIX HOURS AS NEEDED FOR CHRONIC PAIN. 120 tablet 0   solifenacin (VESICARE) 5 MG tablet Take 5 mg by mouth daily.     XARELTO 10 MG TABS tablet TAKE 1 TABLET ONCE DAILY. 90 tablet 3   sucralfate (CARAFATE) 1 GM/10ML suspension Take 10 mLs (1 g total) by mouth every 6 (six) hours. (Patient not taking: Reported on 11/28/2020) 414 mL 3   SUPREP BOWEL PREP KIT 17.5-3.13-1.6 GM/177ML SOLN Suprep-Use as directed (Patient not taking: Reported on 11/28/2020) 354 mL 0   No current facility-administered medications for this visit.   Allergies: Allergies  Allergen Reactions   Codeine Rash   Tuna [Fish Allergy] Rash   I reviewed her past medical history, social history, family history, and environmental history and no significant changes have been reported from her previous visit.  Review of Systems  Constitutional:  Negative for appetite change, chills, fever and unexpected weight  change.  HENT:  Negative for congestion and rhinorrhea.   Eyes:  Negative for itching.  Respiratory:  Negative for cough, chest tightness, shortness of breath and wheezing.   Cardiovascular:  Negative for chest pain.  Gastrointestinal:  Negative for abdominal pain.  Genitourinary:  Negative for difficulty urinating.  Skin:  Negative for rash.       itching  Neurological:  Negative for headaches.   Objective: BP 98/72   Pulse 75   Temp (!) 96.9 F (36.1 C) (Temporal)   Resp 16   SpO2 99%  There is no height or weight on file to calculate BMI. Physical Exam Vitals and nursing note reviewed.  Constitutional:      Appearance: Normal appearance. She is well-developed.  HENT:     Head: Normocephalic and atraumatic.     Right Ear: External ear normal.     Left Ear: External ear normal.     Nose: Nose normal.     Mouth/Throat:     Mouth: Mucous membranes are moist.     Pharynx: Oropharynx is clear.  Eyes:  Conjunctiva/sclera: Conjunctivae normal.  Cardiovascular:     Rate and Rhythm: Normal rate and regular rhythm.     Heart sounds: Normal heart sounds. No murmur heard.   No friction rub. No gallop.  Pulmonary:     Effort: Pulmonary effort is normal.     Breath sounds: Normal breath sounds. No wheezing, rhonchi or rales.  Musculoskeletal:     Cervical back: Neck supple.  Skin:    General: Skin is warm.     Findings: No rash.  Neurological:     Mental Status: She is alert and oriented to person, place, and time.  Psychiatric:        Behavior: Behavior normal.   Previous notes and tests were reviewed. The plan was reviewed with the patient/family, and all questions/concerned were addressed.  It was my pleasure to see Crystal Garza today and participate in her care. Please feel free to contact me with any questions or concerns.  Sincerely,  Rexene Alberts, DO Allergy & Immunology  [facility] office: [place] office:  [phone]

## 2020-11-28 ENCOUNTER — Other Ambulatory Visit: Payer: Self-pay

## 2020-11-28 ENCOUNTER — Encounter: Payer: Self-pay | Admitting: Allergy

## 2020-11-28 ENCOUNTER — Ambulatory Visit (INDEPENDENT_AMBULATORY_CARE_PROVIDER_SITE_OTHER): Payer: Medicare Other | Admitting: Allergy

## 2020-11-28 VITALS — BP 98/72 | HR 75 | Temp 96.9°F | Resp 16

## 2020-11-28 DIAGNOSIS — R21 Rash and other nonspecific skin eruption: Secondary | ICD-10-CM | POA: Diagnosis not present

## 2020-11-28 DIAGNOSIS — J310 Chronic rhinitis: Secondary | ICD-10-CM | POA: Diagnosis not present

## 2020-11-28 DIAGNOSIS — L299 Pruritus, unspecified: Secondary | ICD-10-CM | POA: Diagnosis not present

## 2020-11-28 DIAGNOSIS — J3089 Other allergic rhinitis: Secondary | ICD-10-CM

## 2020-11-28 DIAGNOSIS — T781XXD Other adverse food reactions, not elsewhere classified, subsequent encounter: Secondary | ICD-10-CM

## 2020-11-28 MED ORDER — FAMOTIDINE 20 MG PO TABS: 20.0000 mg | ORAL_TABLET | Freq: Two times a day (BID) | ORAL | 3 refills | Status: DC

## 2020-11-28 NOTE — Assessment & Plan Note (Signed)
Past history - Asymptomatic with no medications. 2018 intradermal testing positive to grass, ragweed, weed, trees, mold, cat. 2022 skin prick testing was negative to environmental allergy panel. Interim history - bloodwork negative to environmental allergy panel.  Monitor symptoms.

## 2020-11-28 NOTE — Assessment & Plan Note (Signed)
Past history - Currently avoiding tuna and declined in office food challenge in the past - had negative skin prick testing and bloodwork. Noticed shortness of breath x 2 after eating pineapple recently. Resolved within minutes. Some fresh fruits cause some itching. 2022 skin prick testing was negative to foods. Interim history - not interested in reintroducing tuna.  . Continue to avoid foods that are bothersome - tuna. . May try pineapple at home.

## 2020-11-28 NOTE — Patient Instructions (Addendum)
Skin:  Continue zyrtec (cetirizine) '10mg'$  twice a day. If rash/itching is not controlled or causes drowsiness let us know. START famotidine '20mg'$  twice a day. Avoid the following potential triggers: alcohol, tight clothing, NSAIDs, hot showers and getting overheated. Continue proper skin care - moisturize daily.   Take pictures of the rash. Keep track of episodes.  Food: Continue to avoid foods that are bothersome - tuna. May try pineapple at home.   Follow up in 2 months or sooner if needed.  Ge bloodwork drawn 1 week before the next appointment - make sure you are feeling well and not having any fevers when getting the bloodwork drawn.    Skin care recommendations  Bath time: Always use lukewarm water. AVOID very hot or cold water. Keep bathing time to 5-10 minutes. Do NOT use bubble bath. Use a mild soap and use just enough to wash the dirty areas. Do NOT scrub skin vigorously.  After bathing, pat dry your skin with a towel. Do NOT rub or scrub the skin.  Moisturizers and prescriptions:  ALWAYS apply moisturizers immediately after bathing (within 3 minutes). This helps to lock-in moisture. Use the moisturizer several times a day over the whole body. Good summer moisturizers include: Aveeno, CeraVe, Cetaphil. Good winter moisturizers include: Aquaphor, Vaseline, Cerave, Cetaphil, Eucerin, Vanicream. When using moisturizers along with medications, the moisturizer should be applied about one hour after applying the medication to prevent diluting effect of the medication or moisturize around where you applied the medications. When not using medications, the moisturizer can be continued twice daily as maintenance.  Laundry and clothing: Avoid laundry products with added color or perfumes. Use unscented hypo-allergenic laundry products such as Tide free, Cheer free & gentle, and All free and clear.  If the skin still seems dry or sensitive, you can try double-rinsing the  clothes. Avoid tight or scratchy clothing such as wool. Do not use fabric softeners or dyer sheets.  Reducing Pollen Exposure Pollen seasons: trees (spring), grass (summer) and ragweed/weeds (fall). Keep windows closed in your home and car to lower pollen exposure.  Install air conditioning in the bedroom and throughout the house if possible.  Avoid going out in dry windy days - especially early morning. Pollen counts are highest between 5 - 10 AM and on dry, hot and windy days.  Save outside activities for late afternoon or after a heavy rain, when pollen levels are lower.  Avoid mowing of grass if you have grass pollen allergy. Be aware that pollen can also be transported indoors on people and pets.  Dry your clothes in an automatic dryer rather than hanging them outside where they might collect pollen.  Rinse hair and eyes before bedtime.

## 2020-11-28 NOTE — Assessment & Plan Note (Addendum)
Past history - Breaking out in rash for the past 4 years about 2 times per week. No change since onset. Outbreaks last 1-2 days. No specific triggers noted but concerned about developing new allergies. 2018 intradermal testing positive to grass, ragweed, weed, trees, mold, cat; 2018 skin prick testing negative to select foods. 2018 bloodwork - negative to seafood panel, CU index, tryptase, ANA, ESR, coffee. Elevated crp. Tried zyrtec and benadryl with unknown benefit. 2022 skin testing showed: Negative to environmental allergy panel, food panel. Interim history - rash resolved but still itching. Concerned about test results and wanted to discuss in more detail.   Reviewed lab results in detail with patient - CBC diff, CMP, crp, TSH, ANA, tryptase, alpha gal were all normal. ESR and CU elevated. CU index identifies that patient is producing more histamine than normal but usually no allergic triggers associated with this - may be contributing to her itching. ESR is a nonspecific inflammatory marker and the rest of bloodwork was normal which is reassuring - will recheck ESR at next visit to make sure it's trending down.   Continue zyrtec (cetirizine) 1m twice a day.  If rash/itching is not controlled or causes drowsiness let uKoreaknow. .Marland KitchenSTART famotidine 279mtwice a day. . Avoid the following potential triggers: alcohol, tight clothing, NSAIDs, hot showers and getting overheated. . Continue proper skin care - moisturize daily.  . Take pictures of the rash. . Marland Kitcheneep track of episodes.

## 2020-12-09 ENCOUNTER — Other Ambulatory Visit: Payer: Self-pay | Admitting: Family Medicine

## 2020-12-10 MED ORDER — OXYCODONE-ACETAMINOPHEN 10-325 MG PO TABS: ORAL_TABLET | ORAL | 0 refills | Status: DC

## 2020-12-23 ENCOUNTER — Other Ambulatory Visit: Payer: Self-pay

## 2020-12-23 ENCOUNTER — Encounter: Payer: Self-pay | Admitting: Gastroenterology

## 2020-12-23 ENCOUNTER — Ambulatory Visit (AMBULATORY_SURGERY_CENTER): Payer: Medicare Other | Admitting: Gastroenterology

## 2020-12-23 VITALS — BP 129/68 | HR 75 | Temp 97.1°F | Resp 14 | Ht 67.0 in | Wt 289.0 lb

## 2020-12-23 DIAGNOSIS — R109 Unspecified abdominal pain: Secondary | ICD-10-CM | POA: Diagnosis not present

## 2020-12-23 DIAGNOSIS — I10 Essential (primary) hypertension: Secondary | ICD-10-CM | POA: Diagnosis not present

## 2020-12-23 DIAGNOSIS — K449 Diaphragmatic hernia without obstruction or gangrene: Secondary | ICD-10-CM

## 2020-12-23 DIAGNOSIS — K5909 Other constipation: Secondary | ICD-10-CM | POA: Diagnosis not present

## 2020-12-23 DIAGNOSIS — R1013 Epigastric pain: Secondary | ICD-10-CM | POA: Diagnosis not present

## 2020-12-23 DIAGNOSIS — Z800 Family history of malignant neoplasm of digestive organs: Secondary | ICD-10-CM | POA: Diagnosis not present

## 2020-12-23 DIAGNOSIS — Z1211 Encounter for screening for malignant neoplasm of colon: Secondary | ICD-10-CM

## 2020-12-23 DIAGNOSIS — Z9884 Bariatric surgery status: Secondary | ICD-10-CM | POA: Diagnosis not present

## 2020-12-23 DIAGNOSIS — K319 Disease of stomach and duodenum, unspecified: Secondary | ICD-10-CM | POA: Diagnosis not present

## 2020-12-23 MED ORDER — SODIUM CHLORIDE 0.9 % IV SOLN: 500.0000 mL | Freq: Once | INTRAVENOUS | Status: DC

## 2020-12-23 NOTE — Op Note (Addendum)
[place] Patient Name: Crystal Garza Procedure Date: 12/23/2020 7:49 AM MRN: [id number] Endoscopist: Remo Lipps P. Havery Moros , MD Age: 50 Referring MD:  Date of Birth: 04/30/1971 Gender: Female Account #: [id number] Procedure:                Upper GI endoscopy Indications:              Epigastric abdominal pain and mid abdominal pain -                            CT and RUQ [name] without clear cause, now s/p                            Roux-en-Y, on Dexilant with some improvement of                            symptoms Medicines:                Monitored Anesthesia Care Procedure:                Pre-Anesthesia Assessment:                           - Prior to the procedure, a History and Physical                            was performed, and patient medications and                            allergies were reviewed. The patient's tolerance of                            previous anesthesia was also reviewed. The risks                            and benefits of the procedure and the sedation                            options and risks were discussed with the patient.                            All questions were answered, and informed consent                            was obtained. Prior Anticoagulants: The patient has                            taken Xarelto (rivaroxaban), last dose was 2 days                            prior to procedure. ASA Grade Assessment: III - A                            patient with severe systemic disease. After  reviewing the risks and benefits, the patient was                            deemed in satisfactory condition to undergo the                            procedure.                           After obtaining informed consent, the endoscope was                            passed under direct vision. Throughout the                            procedure, the patient's blood pressure, pulse, and                             oxygen saturations were monitored continuously. The                            GIF [id number] was introduced through the                            mouth, and advanced to the jejunum. The upper GI                            endoscopy was accomplished without difficulty. The                            patient tolerated the procedure well. Scope In: Scope Out: Findings:                 Esophagogastric landmarks were identified: the                            Z-line was found at 35 cm, the gastroesophageal                            junction was found at 35 cm and the upper extent of                            the gastric folds was found at 37 cm from the                            incisors.                           A 2 cm hiatal hernia was present.                           The exam of the esophagus was otherwise normal.                           Evidence of a Roux-en-Y  gastrojejunostomy was                            found. The gastrojejunal anastomosis was                            characterized by healthy appearing mucosa. No                            marginal ulcers seen.                           The exam of the gastric pouch was otherwise normal.                           Biopsies were taken with a cold forceps in the                            gastric body for Helicobacter pylori testing.                           The examined small bowel limb and blind pouch was                            normal. Complications:            No immediate complications. Estimated blood loss:                            Minimal. Estimated Blood Loss:     Estimated blood loss was minimal. Impression:               - Esophagogastric landmarks identified.                           - 2 cm hiatal hernia.                           - Normal esophagus otherwise.                           - Roux-en-Y gastrojejunostomy with gastrojejunal                            anastomosis characterized by healthy  appearing                            mucosa, no ulcerations noted.                           - Gastric pouch is normal - biopsies taken to rule                            out H pylori                           - Normal examined small bowel limb.  No overt cause for symptoms on this exam, some                            improvement with upper tract symptoms on Dexilant. Recommendation:           - Patient has a contact number available for                            emergencies. The signs and symptoms of potential                            delayed complications were discussed with the                            patient. Return to normal activities tomorrow.                            Written discharge instructions were provided to the                            patient.                           - Resume previous diet.                           - Continue present medications. Resumption of                            Xarelto per discussion on timing of colonoscopy,                            see that note for details                           - Await pathology results with further                            recommendations Remo Lipps P. Havery Moros, MD 12/23/2020 8:32:56 AM This report has been signed electronically.

## 2020-12-23 NOTE — Progress Notes (Signed)
Called to room to assist during endoscopic procedure.  Patient ID and intended procedure confirmed with present staff. Received instructions for my participation in the procedure from the performing physician.  

## 2020-12-23 NOTE — Progress Notes (Signed)
[facility] History and Physical   Primary Care Physician:  Dickie La, MD   Reason for Procedure:   Abdominal pain, s/p roux-en-Y gastric bypass, FH of CRC  Plan:    Endoscopy and colonoscopy     HPI: Crystal Garza is a 50 y.o. 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 Switched to dexilant with some improvement since [facility] seen her. Colonoscopy last done 5 years ago, she has a strong FH of CRC in her parent age 28, here for screening procedure. She denies any changes in her history since I have last seen her. On eliquis, last dose taken was AM of 8/19. [name] and CT done without clear cause for symptoms.   Past Medical History:  Diagnosis Date   Anemia    iron def   Anginal pain (HCC)    Anxiety    Arthritis    left knee    Back pain    Benign gastrointestinal stromal tumor (GIST)    Chronic knee pain    Depression    DVT (deep venous thrombosis) (HCC)    Esophageal dysmotility    GERD (gastroesophageal reflux disease)    Hiatal hernia    History of kidney stones    Hx of laparoscopic gastric banding    Hypertension    Internal hemorrhoids    Internal hemorrhoids    Migraine    Morbid obesity ([place])    PE (pulmonary embolism)     Past Surgical History:  Procedure Laterality Date   4 HOUR [place] STUDY N/A 05/13/2016   Procedure: 24 HOUR [place] STUDY;  Surgeon: Manus Gunning, MD;  Location: [facility];  Service: Gastroenterology;  Laterality: N/A;   ABDOMINAL HYSTERECTOMY     BIOPSY  02/16/2012   Procedure: BIOPSY;  Surgeon: [name], MD;  Location: [facility];  Service: General;;  biopsy of mass x 2   BREATH TEK H PYLORI  07/28/2011   Procedure: BREATH TEK H PYLORI;  Surgeon: [name], MD;  Location: [facility];  Service: General;  Laterality: N/A;  to be done at [address]  05/1991   COLONOSCOPY WITH PROPOFOL N/A 03/03/2016    Procedure: COLONOSCOPY WITH PROPOFOL;  Surgeon: Manus Gunning, MD;  Location: [facility];  Service: Gastroenterology;  Laterality: N/A;   ESOPHAGEAL MANOMETRY N/A 05/13/2016   Procedure: ESOPHAGEAL MANOMETRY (EM);  Surgeon: Manus Gunning, MD;  Location: [facility];  Service: Gastroenterology;  Laterality: N/A;   ESOPHAGOGASTRODUODENOSCOPY N/A 08/29/2012   Procedure: ESOPHAGOGASTRODUODENOSCOPY (EGD);  Surgeon: [name], MD;  [facility]: [facility];  Service: General;  Laterality: N/A;   ESOPHAGOGASTRODUODENOSCOPY N/A 09/04/2014   Procedure: ESOPHAGOGASTRODUODENOSCOPY (EGD);  Surgeon: Alphonsa Overall, MD;  Location: [facility];  Service: General;  Laterality: N/A;   ESOPHAGOGASTRODUODENOSCOPY (EGD) WITH PROPOFOL N/A 03/03/2016   Procedure: ESOPHAGOGASTRODUODENOSCOPY (EGD) WITH PROPOFOL;  Surgeon: Manus Gunning, MD;  [facility]: [facility];  Service: Gastroenterology;  Laterality: N/A;   EUS  03/03/2012   Procedure: UPPER ENDOSCOPIC ULTRASOUND (EUS) LINEAR;  Surgeon: Milus Banister, MD;  Location: [facility];  Service: Endoscopy;  Laterality: N/A;   GASTRIC ROUX-EN-Y N/A 12/07/2017   Procedure: LAPAROSCOPIC ROUX-EN-Y GASTRIC BYPASS WITH LYSIS OF ADHESIONS AND UPPER ENDOSCOPY ERAS Pathway;  Surgeon: Johnathan Hausen, MD;  Location: [facility];  Service: General;  Laterality: N/A;   gist     KNEE ARTHROSCOPY     LAPAROSCOPIC  GASTRECTOMY  04/08/2012   Procedure: LAPAROSCOPIC GASTRECTOMY;  Surgeon: [name], MD;  Location: [facility];  Service: General;;  removal of GIST tumor of stomach   LAPAROSCOPIC GASTRIC SLEEVE RESECTION N/A 08/29/2012   Procedure: LAPAROSCOPIC GASTRIC SLEEVE RESECTION;  Surgeon: [name], MD;  Location: [facility];  Service: General;  Laterality: N/A;  Sleeve Gastrectomy   LEFT HEART CATH AND CORONARY ANGIOGRAPHY N/A 05/28/2017   Procedure: LEFT HEART CATH AND CORONARY ANGIOGRAPHY;  Surgeon: Leonie Man, MD;  Location: [facility];   Service: Cardiovascular;  Laterality: N/A;   ROUX-EN-Y GASTRIC BYPASS     TOTAL KNEE ARTHROPLASTY Left 10/06/2013   Procedure: LEFT TOTAL KNEE ARTHROPLASTY;  Surgeon: Mcarthur Rossetti, MD;  Location: [facility];  Service: Orthopedics;  Laterality: Left;   TUBAL LIGATION      Prior to Admission medications   Medication Sig Start Date End Date Taking? Authorizing Provider  ALPRAZolam Duanne Moron) 1 MG tablet Take by mouth one in AM one at midday and 2 at bedtime as needed for anxiety 09/11/20  Yes Dickie La, MD  cetirizine (ZYRTEC ALLERGY) 10 MG tablet Take 1 tablet (10 mg total) by mouth 2 (two) times daily. 11/05/20  Yes Garnet Sierras, DO  dexlansoprazole (DEXILANT) 60 MG capsule Take 1 capsule (60 mg total) by mouth daily. Crack open capsule before ingestion 11/13/20  Yes Dru Laurel, Carlota Raspberry, MD  dicyclomine (BENTYL) 10 MG capsule Take 1 capsule (10 mg total) by mouth every 8 (eight) hours as needed for spasms. 11/13/20  Yes Tykwon Fera, Carlota Raspberry, MD  famotidine (PEPCID) 20 MG tablet Take 1 tablet (20 mg total) by mouth 2 (two) times daily. 11/28/20  Yes Garnet Sierras, DO  linaclotide [facility]) 145 MCG CAPS capsule Take 1 capsule (145 mcg total) by mouth daily before breakfast. 08/12/20  Yes Tighe Gitto, Carlota Raspberry, MD  lisinopril (ZESTRIL) 10 MG tablet TAKE 1 TABLET EACH DAY. 08/13/20  Yes Dickie La, MD  ondansetron (ZOFRAN-ODT) 4 MG disintegrating tablet Take 1 tablet (4 mg total) by mouth 2 (two) times daily as needed for nausea or vomiting. 09/17/20  Yes Dickie La, MD  oxyCODONE-acetaminophen (PERCOCET) 10-325 MG tablet TAKE ONE TABLET EVERY SIX HOURS AS NEEDED FOR CHRONIC PAIN. 12/10/20  Yes Dickie La, MD  solifenacin (VESICARE) 5 MG tablet Take 5 mg by mouth daily. 11/20/19  Yes [provider]  XARELTO 10 MG TABS tablet TAKE 1 TABLET ONCE DAILY. 02/12/20  Yes Dickie La, MD    Current Outpatient Medications  Medication Sig Dispense Refill   ALPRAZolam (XANAX) 1 MG tablet Take by mouth  one in AM one at midday and 2 at bedtime as needed for anxiety 120 tablet 5   cetirizine (ZYRTEC ALLERGY) 10 MG tablet Take 1 tablet (10 mg total) by mouth 2 (two) times daily. 60 tablet 5   dexlansoprazole (DEXILANT) 60 MG capsule Take 1 capsule (60 mg total) by mouth daily. Crack open capsule before ingestion 90 capsule 3   dicyclomine (BENTYL) 10 MG capsule Take 1 capsule (10 mg total) by mouth every 8 (eight) hours as needed for spasms. 30 capsule 3   famotidine (PEPCID) 20 MG tablet Take 1 tablet (20 mg total) by mouth 2 (two) times daily. 60 tablet 3   linaclotide (LINZESS) 145 MCG CAPS capsule Take 1 capsule (145 mcg total) by mouth daily before breakfast. 90 capsule 0   lisinopril (ZESTRIL) 10 MG tablet TAKE 1 TABLET EACH  DAY. 90 tablet 3   ondansetron (ZOFRAN-ODT) 4 MG disintegrating tablet Take 1 tablet (4 mg total) by mouth 2 (two) times daily as needed for nausea or vomiting. 60 tablet 3   oxyCODONE-acetaminophen (PERCOCET) 10-325 MG tablet TAKE ONE TABLET EVERY SIX HOURS AS NEEDED FOR CHRONIC PAIN. 120 tablet 0   solifenacin (VESICARE) 5 MG tablet Take 5 mg by mouth daily.     XARELTO 10 MG TABS tablet TAKE 1 TABLET ONCE DAILY. 90 tablet 3   Current Facility-Administered Medications  Medication Dose Route Frequency Provider Last Rate Last Admin   0.9 %  sodium chloride infusion  500 mL Intravenous Once Ronelle Smallman, Carlota Raspberry, MD        Allergies as of 12/23/2020 - Review Complete 12/23/2020  Allergen Reaction Noted   Codeine Rash    Geralyn Flash [fish allergy] Rash 11/23/2017    Family History  Problem Relation Age of Onset   Diabetes Mother    Diabetes Father    Heart disease Father    Colon cancer Father        brain tumor   Colon polyps Father    Colitis Father    Irritable bowel syndrome Father    Diabetes Maternal Grandmother    Diabetes Maternal Grandfather    Schizophrenia Maternal Grandfather    Heart disease Paternal Uncle    Diabetes Paternal Uncle    Heart disease  Paternal Grandmother    Heart disease Paternal Grandfather    Diabetes Maternal Aunt    Diabetes Maternal Uncle    Diabetes Paternal Aunt    Cancer Other    Hypertension Other    Esophageal cancer Neg Hx     Social History   Socioeconomic History   Marital status: Divorced    Spouse name: Not on file   Number of children: 3   Years of education: 12   Highest education level: Not on file  Occupational History   Occupation: CNA , does Home Health  Tobacco Use   Smoking status: Never   Smokeless tobacco: Never  Vaping Use   Vaping Use: Never used  Substance and Sexual Activity   Alcohol use: No    Alcohol/week: 0.0 standard drinks   Drug use: No   Sexual activity: Yes    Birth control/protection: Surgical  Other Topics Concern   Not on file  Social History Narrative   Patient has been on disability since 2004 for knee pain, back pain, depression and acid reflux. She does work part-time now as Quarry manager.      She is caring for her granddaughter Amia age 42 (has sickle cell, goes to [facility] every month and has had multiple hospitalizations).       Patient enjoys going on walks, going to the beach, and cooking.    Social Determinants of Health   Financial Resource Strain: Low Risk    Difficulty of Paying Living Expenses: Not hard at all  Food Insecurity: No Food Insecurity   Worried About Charity fundraiser in the Last Year: Never true   [place] in the Last Year: Never true  Transportation Needs: No Transportation Needs   Lack of Transportation (Medical): No   Lack of Transportation (Non-Medical): No  Physical Activity: Sufficiently Active   Days of Exercise per Week: 4 days   Minutes of Exercise per Session: 40 min  Stress: Stress Concern Present   Feeling of Stress : Very much  Social Connections: Moderately Isolated   Frequency of  Communication with Friends and Family: More than three times a week   Frequency of Social Gatherings with Friends and Family: More than  three times a week   Attends Religious Services: More than 4 times per year   Active Member of [employer] or Organizations: No   Attends Archivist Meetings: Never   Marital Status: Divorced  Human resources officer Violence: Not At Risk   Fear of Current or Ex-Partner: No   Emotionally Abused: No   Physically Abused: No   Sexually Abused: No    Review of Systems: All other review of systems negative except as mentioned in the HPI.  Physical Exam: Vital signs BP (!) 127/58   Pulse 75   Temp (!) 97.1 F (36.2 C)   Resp 12   Ht '5\' 7"'$  (1.702 m)   Wt 289 lb (131.1 kg)   SpO2 100%   BMI 45.26 kg/m   General:   Alert,  Well-developed, well-nourished, pleasant and cooperative in NAD Lungs:  Clear throughout to auscultation.   Heart:  Regular rate and rhythm;  Abdomen:  Soft, nontender and nondistended.   Neuro/Psych:  Alert and cooperative. Normal mood and affect. A and O x 3  Jolly Mango, MD [facility]

## 2020-12-23 NOTE — Patient Instructions (Addendum)
Handout given:  hiatal hernia Maintain clear liquid  diet and tomorrow Continue current medications  hold xarelto tonight as well. Await pathology results   YOU HAD AN ENDOSCOPIC PROCEDURE TODAY AT [facility]:   Refer to the procedure report that was given to you for any specific questions about what was found during the examination.  If the procedure report does not answer your questions, please call your gastroenterologist to clarify.  If you requested that your care partner not be given the details of your procedure findings, then the procedure report has been included in a sealed envelope for you to review at your convenience later.  YOU SHOULD EXPECT: Some feelings of bloating in the abdomen. Passage of more gas than usual.  Walking can help get rid of the air that was put into your GI tract during the procedure and reduce the bloating. If you had a lower endoscopy (such as a colonoscopy or flexible sigmoidoscopy) you may notice spotting of blood in your stool or on the toilet paper. If you underwent a bowel prep for your procedure, you may not have a normal bowel movement for a few days.  Please Note:  You might notice some irritation and congestion in your nose or some drainage.  This is from the oxygen used during your procedure.  There is no need for concern and it should clear up in a day or so.  SYMPTOMS TO REPORT IMMEDIATELY:  Following lower endoscopy (colonoscopy or flexible sigmoidoscopy):  Excessive amounts of blood in the stool  Significant tenderness or worsening of abdominal pains  Swelling of the abdomen that is new, acute  Fever of 100F or higher  Following upper endoscopy (EGD)  Vomiting of blood or coffee ground material  New chest pain or pain under the shoulder blades  Painful or persistently difficult swallowing  New shortness of breath  Fever of 100F or higher  Black, tarry-looking stools  For urgent or emergent issues, a gastroenterologist  can be reached at any hour by calling [phone]. Do not use MyChart messaging for urgent concerns.   DIET:  We do recommend a small meal at first, but then you may proceed to your regular diet.  Drink plenty of fluids but you should avoid alcoholic beverages for 24 hours.  ACTIVITY:  You should plan to take it easy for the rest of today and you should NOT DRIVE or use heavy machinery until tomorrow (because of the sedation medicines used during the test).    FOLLOW UP: Our staff will call the number listed on your records 48-72 hours following your procedure to check on you and address any questions or concerns that you may have regarding the information given to you following your procedure. If we do not reach you, we will leave a message.  We will attempt to reach you two times.  During this call, we will ask if you have developed any symptoms of COVID 19. If you develop any symptoms (ie: fever, flu-like symptoms, shortness of breath, cough etc.) before then, please call [phone].  If you test positive for Covid 19 in the 2 weeks post procedure, please call and report this information to [name].    If any biopsies were taken you will be contacted by phone or by letter within the next 1-3 weeks.  Please call us at [phone] if you have not heard about the biopsies in 3 weeks.   SIGNATURES/CONFIDENTIALITY: You and/or your care partner have signed paperwork  which will be entered into your electronic medical record.  These signatures attest to the fact that that the information above on your After Visit Summary has been reviewed and is understood.  Full responsibility of the confidentiality of this discharge information lies with you and/or your care-partner.

## 2020-12-23 NOTE — Progress Notes (Signed)
VS by CW  Pt's states no medical or surgical changes since previsit or office visit.  

## 2020-12-23 NOTE — Progress Notes (Signed)
Report to PACU, RN, vss, BBS= Clear.  

## 2020-12-23 NOTE — Op Note (Signed)
[facility] Patient Name: Crystal Garza Procedure Date: 12/23/2020 7:48 AM MRN: [id number] Endoscopist: Remo Lipps P. Havery Moros , MD Age: 50 Referring MD:  Date of Birth: 12/10/1970 Gender: Female Account #: [id number] Procedure:                Colonoscopy Indications:              Screening in patient at increased risk: Family                            history of 1st-degree relative with colorectal                            cancer (dx age 2s) Medicines:                Monitored Anesthesia Care Procedure:                Pre-Anesthesia Assessment:                           - Prior to the procedure, a History and Physical                            was performed, and patient medications and                            allergies were reviewed. The patient's tolerance of                            previous anesthesia was also reviewed. The risks                            and benefits of the procedure and the sedation                            options and risks were discussed with the patient.                            All questions were answered, and informed consent                            was obtained. Prior Anticoagulants: The patient has                            taken Xarelto (rivaroxaban), last dose was 2 days                            prior to procedure. ASA Grade Assessment: III - A                            patient with severe systemic disease. After                            reviewing the risks and benefits, the patient was  deemed in satisfactory condition to undergo the                            procedure.                           After obtaining informed consent, the colonoscope                            was passed under direct vision. Throughout the                            procedure, the patient's blood pressure, pulse, and                            oxygen saturations were monitored continuously. The                             CF [id number] was introduced through the anus                            with the intention of advancing to the cecum. The                            scope was advanced to the descending colon before                            the procedure was aborted. Medications were given.                            The colonoscopy was performed without difficulty.                            The patient tolerated the procedure well. The                            quality of the bowel preparation was inadequate.                            The rectum was photographed. Scope In: 8:16:42 AM Scope Out: 8:19:34 AM Total Procedure Duration: 0 hours 2 minutes 52 seconds  Findings:                 The perianal and digital rectal examinations were                            normal.                           A moderate amount of semi-solid stool was found in                            the rectum, in the sigmoid colon and in the  descending colon, precluding visualization. The                            procedure was aborted when it became clear that the                            preparation was inadequate for screening purposes.                           The exam was otherwise without abnormality of what                            was visualized. Complications:            No immediate complications. Estimated blood loss:                            None. Estimated Blood Loss:     Estimated blood loss: none. Impression:               - Preparation of the colon was inadequate.                           - Stool in the rectum, in the sigmoid colon and in                            the descending colon.                           - The examination was otherwise normal.                           - No specimens collected. Recommendation:           - Patient has a contact number available for                            emergencies. The signs and symptoms of potential                             delayed complications were discussed with the                            patient. Return to normal activities tomorrow.                            Written discharge instructions were provided to the                            patient.                           - Resume previous diet.                           - Continue present medications.                           -  Repeat colonoscopy because the bowel preparation                            was suboptimal. I will discuss options with the                            patient. One option is to stay on clear liquids                            today and do another prep tonight, and see if                            openings for colonoscopy tomorrow with one of my                            partners (I am not in the [facility] however). If she                            chooses this option would continue to hold Xarelto.                            The other option is to reschedule this in the next                            few months at the patient's convenience and do 2                            day prep. If she chooses this option can resume                            Xarelto today. Remo Lipps P. Havery Moros, MD 12/23/2020 8:25:29 AM This report has been signed electronically.

## 2020-12-24 ENCOUNTER — Ambulatory Visit (AMBULATORY_SURGERY_CENTER): Payer: Medicare Other | Admitting: Gastroenterology

## 2020-12-24 ENCOUNTER — Encounter: Payer: Self-pay | Admitting: Gastroenterology

## 2020-12-24 VITALS — BP 118/93 | HR 70 | Temp 97.1°F | Resp 18 | Ht 67.0 in | Wt 289.0 lb

## 2020-12-24 DIAGNOSIS — R109 Unspecified abdominal pain: Secondary | ICD-10-CM | POA: Diagnosis not present

## 2020-12-24 DIAGNOSIS — Z800 Family history of malignant neoplasm of digestive organs: Secondary | ICD-10-CM

## 2020-12-24 DIAGNOSIS — Z1211 Encounter for screening for malignant neoplasm of colon: Secondary | ICD-10-CM

## 2020-12-24 DIAGNOSIS — K640 First degree hemorrhoids: Secondary | ICD-10-CM

## 2020-12-24 DIAGNOSIS — K635 Polyp of colon: Secondary | ICD-10-CM

## 2020-12-24 DIAGNOSIS — D123 Benign neoplasm of transverse colon: Secondary | ICD-10-CM

## 2020-12-24 DIAGNOSIS — K5909 Other constipation: Secondary | ICD-10-CM

## 2020-12-24 DIAGNOSIS — K6389 Other specified diseases of intestine: Secondary | ICD-10-CM | POA: Diagnosis not present

## 2020-12-24 MED ORDER — SODIUM CHLORIDE 0.9 % IV SOLN: 500.0000 mL | Freq: Once | INTRAVENOUS | Status: DC

## 2020-12-24 NOTE — Patient Instructions (Signed)
Please read handouts provided. Continue present medications. Await pathology results. Return to GI office as needed. Repeat colonoscopy in 5 years for screening. Resume Xarelto ( rivaroxaban ) at prior dose in 2 days.   YOU HAD AN ENDOSCOPIC PROCEDURE TODAY AT [facility]:   Refer to the procedure report that was given to you for any specific questions about what was found during the examination.  If the procedure report does not answer your questions, please call your gastroenterologist to clarify.  If you requested that your care partner not be given the details of your procedure findings, then the procedure report has been included in a sealed envelope for you to review at your convenience later.  YOU SHOULD EXPECT: Some feelings of bloating in the abdomen. Passage of more gas than usual.  Walking can help get rid of the air that was put into your GI tract during the procedure and reduce the bloating. If you had a lower endoscopy (such as a colonoscopy or flexible sigmoidoscopy) you may notice spotting of blood in your stool or on the toilet paper. If you underwent a bowel prep for your procedure, you may not have a normal bowel movement for a few days.  Please Note:  You might notice some irritation and congestion in your nose or some drainage.  This is from the oxygen used during your procedure.  There is no need for concern and it should clear up in a day or so.  SYMPTOMS TO REPORT IMMEDIATELY:  Following lower endoscopy (colonoscopy or flexible sigmoidoscopy):  Excessive amounts of blood in the stool  Significant tenderness or worsening of abdominal pains  Swelling of the abdomen that is new, acute  Fever of 100F or higher   For urgent or emergent issues, a gastroenterologist can be reached at any hour by calling [phone]. Do not use MyChart messaging for urgent concerns.    DIET:  We do recommend a small meal at first, but then you may proceed to your  regular diet.  Drink plenty of fluids but you should avoid alcoholic beverages for 24 hours.  ACTIVITY:  You should plan to take it easy for the rest of today and you should NOT DRIVE or use heavy machinery until tomorrow (because of the sedation medicines used during the test).    FOLLOW UP: Our staff will call the number listed on your records 48-72 hours following your procedure to check on you and address any questions or concerns that you may have regarding the information given to you following your procedure. If we do not reach you, we will leave a message.  We will attempt to reach you two times.  During this call, we will ask if you have developed any symptoms of COVID 19. If you develop any symptoms (ie: fever, flu-like symptoms, shortness of breath, cough etc.) before then, please call [phone].  If you test positive for Covid 19 in the 2 weeks post procedure, please call and report this information to [name].    If any biopsies were taken you will be contacted by phone or by letter within the next 1-3 weeks.  Please call us at [phone] if you have not heard about the biopsies in 3 weeks.    SIGNATURES/CONFIDENTIALITY: You and/or your care partner have signed paperwork which will be entered into your electronic medical record.  These signatures attest to the fact that that the information above on your After Visit Summary has been reviewed and is  understood.  Full responsibility of the confidentiality of this discharge information lies with you and/or your care-partner.

## 2020-12-24 NOTE — Progress Notes (Signed)
Vital signs checked by:Hensley  The patient states no changes in medical or surgical history since visit on 12/23/20.

## 2020-12-24 NOTE — Op Note (Signed)
[facility] Patient Name: Crystal Garza Procedure Date: 12/24/2020 7:55 AM MRN: [id number] Endoscopist: Gerrit Heck , MD Age: 50 Referring MD:  Date of Birth: 1970-09-12 Gender: Female Account #: [id number] Procedure:                Colonoscopy Indications:              Screening in patient at increased risk: Colorectal                            cancer in father before age 1 (diagnosed in his                            19's). Medicines:                Monitored Anesthesia Care Procedure:                Pre-Anesthesia Assessment:                           - Prior to the procedure, a History and Physical                            was performed, and patient medications and                            allergies were reviewed. The patient's tolerance of                            previous anesthesia was also reviewed. The risks                            and benefits of the procedure and the sedation                            options and risks were discussed with the patient.                            All questions were answered, and informed consent                            was obtained. Prior Anticoagulants: The patient has                            taken Xarelto (rivaroxaban), last dose was 3 days                            prior to procedure. ASA Grade Assessment: III - A                            patient with severe systemic disease. After                            reviewing the risks and benefits, the patient was  deemed in satisfactory condition to undergo the                            procedure.                           After obtaining informed consent, the colonoscope                            was passed under direct vision. Throughout the                            procedure, the patient's blood pressure, pulse, and                            oxygen saturations were monitored continuously. The                             Colonoscope was introduced through the anus and                            advanced to the the terminal ileum. The colonoscopy                            was performed without difficulty. The patient                            tolerated the procedure well. The quality of the                            bowel preparation was good. The terminal ileum,                            ileocecal valve, appendiceal orifice, and rectum                            were photographed. Scope In: 8:10:43 AM Scope Out: 8:30:02 AM Scope Withdrawal Time: 0 hours 14 minutes 44 seconds  Total Procedure Duration: 0 hours 19 minutes 19 seconds  Findings:                 The perianal and digital rectal examinations were                            normal.                           A 4 mm polyp was found in the transverse colon. The                            polyp was sessile. The polyp was removed with a                            cold snare. Resection and retrieval were complete.  Estimated blood loss was minimal.                           Non-bleeding internal hemorrhoids were found during                            retroflexion. The hemorrhoids were small.                           The exam was otherwise normal throughout the                            remainder of the colon.                           The terminal ileum appeared normal. Complications:            No immediate complications. Estimated Blood Loss:     Estimated blood loss was minimal. Impression:               - One 4 mm polyp in the transverse colon, removed                            with a cold snare. Resected and retrieved.                           - Non-bleeding internal hemorrhoids.                           - The examined portion of the ileum was normal. Recommendation:           - Patient has a contact number available for                            emergencies. The signs and symptoms of potential                             delayed complications were discussed with the                            patient. Return to normal activities tomorrow.                            Written discharge instructions were provided to the                            patient.                           - Resume previous diet.                           - Continue present medications.                           - Await pathology results.                           -  Repeat colonoscopy in 5 years for surveillance                            due to family history.                           - Return to GI office PRN.                           - Resume Xarelto (rivaroxaban) at prior dose in 2                            days. Gerrit Heck, MD 12/24/2020 8:34:48 AM

## 2020-12-24 NOTE — Progress Notes (Signed)
A and O x3. Report to RN. Tolerated MAC anesthesia well. 

## 2020-12-24 NOTE — Progress Notes (Signed)
GASTROENTEROLOGY PROCEDURE H&P NOTE   Primary Care Physician: Dickie La, MD    Reason for Procedure:   Abdominal pain, Fhx of CRC  Plan:    Colonoscopy  Patient is appropriate for endoscopic procedure(s) in the ambulatory ([place]) setting.  The nature of the procedure, as well as the risks, benefits, and alternatives were carefully and thoroughly reviewed with the patient. Ample time for discussion and questions allowed. The patient understood, was satisfied, and agreed to proceed.     HPI: Crystal Garza is a 50 y.o. female who presents for Colonoscopy today, after unsuccessful colonoscopy yesterday due to poor prep. Prior to that, Colonoscopy last done 5 years ago. She has a strong FH of CRC in her parent age 82  Holding Xarelto for 3 days.    Past Medical History:  Diagnosis Date   Anemia    iron def   Anginal pain (HCC)    Anxiety    Arthritis    left knee    Back pain    Benign gastrointestinal stromal tumor (GIST)    Chronic knee pain    Depression    DVT (deep venous thrombosis) (HCC)    Esophageal dysmotility    GERD (gastroesophageal reflux disease)    Hiatal hernia    History of kidney stones    Hx of laparoscopic gastric banding    Hypertension    Internal hemorrhoids    Internal hemorrhoids    Migraine    Morbid obesity ([place])    PE (pulmonary embolism)     Past Surgical History:  Procedure Laterality Date   58 HOUR [place] STUDY N/A 05/13/2016   Procedure: 24 HOUR [place] STUDY;  Surgeon: Manus Gunning, MD;  Location: [facility];  Service: Gastroenterology;  Laterality: N/A;   ABDOMINAL HYSTERECTOMY     BIOPSY  02/16/2012   Procedure: BIOPSY;  Surgeon: [name], MD;  Location: [facility];  Service: General;;  biopsy of mass x 2   BREATH TEK H PYLORI  07/28/2011   Procedure: BREATH TEK H PYLORI;  Surgeon: [name], MD;  Location: [facility];  Service: General;  Laterality: N/A;  to be done at [place]  05/1991    COLONOSCOPY WITH PROPOFOL N/A 03/03/2016   Procedure: COLONOSCOPY WITH PROPOFOL;  Surgeon: Manus Gunning, MD;  Location: [facility];  Service: Gastroenterology;  Laterality: N/A;   ESOPHAGEAL MANOMETRY N/A 05/13/2016   Procedure: ESOPHAGEAL MANOMETRY (EM);  Surgeon: Manus Gunning, MD;  [facility];  Service: Gastroenterology;  Laterality: N/A;   ESOPHAGOGASTRODUODENOSCOPY N/A 08/29/2012   Procedure: ESOPHAGOGASTRODUODENOSCOPY (EGD);  Surgeon: [name], MD;  [facility];  Service: General;  Laterality: N/A;   ESOPHAGOGASTRODUODENOSCOPY N/A 09/04/2014   Procedure: ESOPHAGOGASTRODUODENOSCOPY (EGD);  Surgeon: Alphonsa Overall, MD;  Location: [facility];  Service: General;  Laterality: N/A;   ESOPHAGOGASTRODUODENOSCOPY (EGD) WITH PROPOFOL N/A 03/03/2016   Procedure: ESOPHAGOGASTRODUODENOSCOPY (EGD) WITH PROPOFOL;  Surgeon: Manus Gunning, MD;  [facility];  Service: Gastroenterology;  Laterality: N/A;   EUS  03/03/2012   Procedure: UPPER ENDOSCOPIC ULTRASOUND (EUS) LINEAR;  Surgeon: Milus Banister, MD;  [facility];  Service: Endoscopy;  Laterality: N/A;   GASTRIC ROUX-EN-Y N/A 12/07/2017   Procedure: LAPAROSCOPIC ROUX-EN-Y GASTRIC BYPASS WITH LYSIS OF ADHESIONS AND UPPER ENDOSCOPY ERAS Pathway;  Surgeon: Johnathan Hausen, MD;  Location: [facility];  Service: General;  Laterality: N/A;   gist     KNEE ARTHROSCOPY  LAPAROSCOPIC GASTRECTOMY  04/08/2012   Procedure: LAPAROSCOPIC GASTRECTOMY;  Surgeon: [name], MD;  [facility]: [facility];  Service: General;;  removal of GIST tumor of stomach   LAPAROSCOPIC GASTRIC SLEEVE RESECTION N/A 08/29/2012   Procedure: LAPAROSCOPIC GASTRIC SLEEVE RESECTION;  Surgeon: [name], MD;  [facility];  Service: General;  Laterality: N/A;  Sleeve Gastrectomy   LEFT HEART CATH AND CORONARY ANGIOGRAPHY N/A 05/28/2017   Procedure: LEFT HEART CATH AND CORONARY ANGIOGRAPHY;  Surgeon: Leonie Man, MD;  Location: [facility];  Service: Cardiovascular;  Laterality: N/A;   ROUX-EN-Y GASTRIC BYPASS     TOTAL KNEE ARTHROPLASTY Left 10/06/2013   Procedure: LEFT TOTAL KNEE ARTHROPLASTY;  Surgeon: Mcarthur Rossetti, MD;  Location: [facility];  Service: Orthopedics;  Laterality: Left;   TUBAL LIGATION      Prior to Admission medications   Medication Sig Start Date End Date Taking? Authorizing Provider  ALPRAZolam Duanne Moron) 1 MG tablet Take by mouth one in AM one at midday and 2 at bedtime as needed for anxiety 09/11/20   Dickie La, MD  cetirizine (ZYRTEC ALLERGY) 10 MG tablet Take 1 tablet (10 mg total) by mouth 2 (two) times daily. 11/05/20   Garnet Sierras, DO  dexlansoprazole (DEXILANT) 60 MG capsule Take 1 capsule (60 mg total) by mouth daily. Crack open capsule before ingestion 11/13/20   Armbruster, Carlota Raspberry, MD  dicyclomine (BENTYL) 10 MG capsule Take 1 capsule (10 mg total) by mouth every 8 (eight) hours as needed for spasms. 11/13/20   Armbruster, Carlota Raspberry, MD  famotidine (PEPCID) 20 MG tablet Take 1 tablet (20 mg total) by mouth 2 (two) times daily. 11/28/20   Garnet Sierras, DO  linaclotide [facility]) 145 MCG CAPS capsule Take 1 capsule (145 mcg total) by mouth daily before breakfast. 08/12/20   Armbruster, Carlota Raspberry, MD  lisinopril (ZESTRIL) 10 MG tablet TAKE 1 TABLET EACH DAY. 08/13/20   Dickie La, MD  ondansetron (ZOFRAN-ODT) 4 MG disintegrating tablet Take 1 tablet (4 mg total) by mouth 2 (two) times daily as needed for nausea or vomiting. 09/17/20   Dickie La, MD  oxyCODONE-acetaminophen (PERCOCET) 10-325 MG tablet TAKE ONE TABLET EVERY SIX HOURS AS NEEDED FOR CHRONIC PAIN. 12/10/20   Dickie La, MD  solifenacin (VESICARE) 5 MG tablet Take 5 mg by mouth daily. 11/20/19   [provider]  XARELTO 10 MG TABS tablet TAKE 1 TABLET ONCE DAILY. 02/12/20   Dickie La, MD    Current Outpatient Medications  Medication Sig Dispense Refill   ALPRAZolam (XANAX) 1 MG tablet Take by  mouth one in AM one at midday and 2 at bedtime as needed for anxiety 120 tablet 5   cetirizine (ZYRTEC ALLERGY) 10 MG tablet Take 1 tablet (10 mg total) by mouth 2 (two) times daily. 60 tablet 5   dexlansoprazole (DEXILANT) 60 MG capsule Take 1 capsule (60 mg total) by mouth daily. Crack open capsule before ingestion 90 capsule 3   dicyclomine (BENTYL) 10 MG capsule Take 1 capsule (10 mg total) by mouth every 8 (eight) hours as needed for spasms. 30 capsule 3   famotidine (PEPCID) 20 MG tablet Take 1 tablet (20 mg total) by mouth 2 (two) times daily. 60 tablet 3   linaclotide (LINZESS) 145 MCG CAPS capsule Take 1 capsule (145 mcg total) by mouth daily before breakfast. 90 capsule 0   lisinopril (ZESTRIL) 10 MG tablet TAKE 1 TABLET  EACH DAY. 90 tablet 3   ondansetron (ZOFRAN-ODT) 4 MG disintegrating tablet Take 1 tablet (4 mg total) by mouth 2 (two) times daily as needed for nausea or vomiting. 60 tablet 3   oxyCODONE-acetaminophen (PERCOCET) 10-325 MG tablet TAKE ONE TABLET EVERY SIX HOURS AS NEEDED FOR CHRONIC PAIN. 120 tablet 0   solifenacin (VESICARE) 5 MG tablet Take 5 mg by mouth daily.     XARELTO 10 MG TABS tablet TAKE 1 TABLET ONCE DAILY. 90 tablet 3   Current Facility-Administered Medications  Medication Dose Route Frequency Provider Last Rate Last Admin   0.9 %  sodium chloride infusion  500 mL Intravenous Once Aleesha Ringstad V, DO        Allergies as of 12/24/2020 - Review Complete 12/24/2020  Allergen Reaction Noted   Codeine Rash    Geralyn Flash [fish allergy] Rash 11/23/2017    Family History  Problem Relation Age of Onset   Diabetes Mother    Diabetes Father    Heart disease Father    Colon cancer Father        brain tumor   Colon polyps Father    Colitis Father    Irritable bowel syndrome Father    Diabetes Maternal Grandmother    Diabetes Maternal Grandfather    Schizophrenia Maternal Grandfather    Heart disease Paternal Uncle    Diabetes Paternal Uncle    Heart  disease Paternal Grandmother    Heart disease Paternal Grandfather    Diabetes Maternal Aunt    Diabetes Maternal Uncle    Diabetes Paternal Aunt    Cancer Other    Hypertension Other    Esophageal cancer Neg Hx     Social History   Socioeconomic History   Marital status: Divorced    Spouse name: Not on file   Number of children: 3   Years of education: 12   Highest education level: Not on file  Occupational History   Occupation: CNA , does Home Health  Tobacco Use   Smoking status: Never   Smokeless tobacco: Never  Vaping Use   Vaping Use: Never used  Substance and Sexual Activity   Alcohol use: No    Alcohol/week: 0.0 standard drinks   Drug use: No   Sexual activity: Yes    Birth control/protection: Surgical  Other Topics Concern   Not on file  Social History Narrative   Patient has been on disability since 2004 for knee pain, back pain, depression and acid reflux. She does work part-time now as Quarry manager.      She is caring for her granddaughter Amia age 67 (has sickle cell, goes to [facility] every month and has had multiple hospitalizations).       Patient enjoys going on walks, going to the beach, and cooking.    Social Determinants of Health   Financial Resource Strain: Low Risk    Difficulty of Paying Living Expenses: Not hard at all  Food Insecurity: No Food Insecurity   Worried About Charity fundraiser in the Last Year: Never true   [place] in the Last Year: Never true  Transportation Needs: No Transportation Needs   Lack of Transportation (Medical): No   Lack of Transportation (Non-Medical): No  Physical Activity: Sufficiently Active   Days of Exercise per Week: 4 days   Minutes of Exercise per Session: 40 min  Stress: Stress Concern Present   Feeling of Stress : Very much  Social Connections: Moderately Isolated   Frequency  of Communication with Friends and Family: More than three times a week   Frequency of Social Gatherings with Friends and Family:  More than three times a week   Attends Religious Services: More than 4 times per year   Active Member of [employer] or Organizations: No   Attends Archivist Meetings: Never   Marital Status: Divorced  Human resources officer Violence: Not At Risk   Fear of Current or Ex-Partner: No   Emotionally Abused: No   Physically Abused: No   Sexually Abused: No    Physical Exam: Vital signs in last 24 hours: '@BP'$  135/66   Pulse 69   Temp (!) 97.1 F (36.2 C)   Resp 11   Ht '5\' 7"'$  (1.702 m)   Wt 289 lb (131.1 kg)   SpO2 100%   BMI 45.26 kg/m  GEN: NAD EYE: Sclerae anicteric ENT: MMM CV: Non-tachycardic Pulm: CTA b/l GI: Soft, NT/ND NEURO:  Alert & Oriented x 3   Gerrit Heck, DO [facility]   12/24/2020 8:02 AM

## 2020-12-24 NOTE — Progress Notes (Signed)
Called to room to assist during endoscopic procedure.  Patient ID and intended procedure confirmed with present staff. Received instructions for my participation in the procedure from the performing physician.  

## 2020-12-26 ENCOUNTER — Telehealth: Payer: Self-pay

## 2020-12-26 NOTE — Telephone Encounter (Signed)
  Follow up Call-  Call back number 12/24/2020 12/23/2020  Post procedure Call Back phone  # [phone]  Permission to leave phone message Yes Yes  Some recent data might be hidden     Patient questions:  Do you have a fever, pain , or abdominal swelling? yes Pain Score  3 *  Have you tolerated food without any problems? No.  Have you been able to return to your normal activities? Yes.    Do you have any questions about your discharge instructions: Diet   No. Medications  No. Follow up visit  No.  Do you have questions or concerns about your Care? Yes.    Actions: * If pain score is 4 or above: No action needed, pain <4.  Patient is having the same left sided abdominal pain that she was having pre procedure.  She has had a bowel movement since the procedure but the pain started back up yesterday.  Patient is able to drink but eating makes her feel nauseated.  Explained to patient that if pain worsens or she is still unable to eat then she needs to call our office.  Patient verbalized understanding.

## 2020-12-30 ENCOUNTER — Encounter: Payer: Self-pay | Admitting: Gastroenterology

## 2021-01-02 ENCOUNTER — Other Ambulatory Visit: Payer: Self-pay | Admitting: Family Medicine

## 2021-01-03 MED ORDER — OXYCODONE-ACETAMINOPHEN 10-325 MG PO TABS: ORAL_TABLET | ORAL | 0 refills | Status: DC

## 2021-01-09 DIAGNOSIS — Z9884 Bariatric surgery status: Secondary | ICD-10-CM | POA: Diagnosis not present

## 2021-01-28 ENCOUNTER — Encounter (INDEPENDENT_AMBULATORY_CARE_PROVIDER_SITE_OTHER): Payer: Self-pay

## 2021-01-28 DIAGNOSIS — R21 Rash and other nonspecific skin eruption: Secondary | ICD-10-CM | POA: Diagnosis not present

## 2021-01-29 LAB — SEDIMENTATION RATE: Sed Rate: 86 mm/hr — ABNORMAL HIGH (ref 0–40)

## 2021-01-30 ENCOUNTER — Encounter: Payer: Self-pay | Admitting: Allergy

## 2021-01-30 ENCOUNTER — Other Ambulatory Visit: Payer: Self-pay

## 2021-01-30 ENCOUNTER — Ambulatory Visit (INDEPENDENT_AMBULATORY_CARE_PROVIDER_SITE_OTHER): Payer: Medicare Other | Admitting: Allergy

## 2021-01-30 VITALS — BP 122/64 | HR 70 | Temp 97.9°F | Resp 16

## 2021-01-30 DIAGNOSIS — L299 Pruritus, unspecified: Secondary | ICD-10-CM | POA: Diagnosis not present

## 2021-01-30 DIAGNOSIS — R899 Unspecified abnormal finding in specimens from other organs, systems and tissues: Secondary | ICD-10-CM

## 2021-01-30 DIAGNOSIS — T781XXD Other adverse food reactions, not elsewhere classified, subsequent encounter: Secondary | ICD-10-CM | POA: Diagnosis not present

## 2021-01-30 MED ORDER — FAMOTIDINE 20 MG PO TABS: 20.0000 mg | ORAL_TABLET | Freq: Two times a day (BID) | ORAL | 3 refills | Status: DC

## 2021-01-30 NOTE — Assessment & Plan Note (Addendum)
Past history - Breaking out in rash for the past 4 years about 2 times per week. No change since onset. Outbreaks last 1-2 days. No specific triggers noted but concerned about developing new allergies. 2018 intradermal testing positive to grass, ragweed, weed, trees, mold, cat; 2018 skin prick testing negative to select foods. 2018 bloodwork - negative to seafood panel, CU index, tryptase, ANA, ESR, coffee. Elevated crp. 2022 skin testing showed: Negative to environmental allergy panel, food panel. 2022 bloodwork - CBC diff, CMP, crp, TSH, ANA, tryptase, alpha gal were all normal. ESR and CU elevated. Interim history - still has itching 4 days per week. No triggers noted. Did not try Pepcid. Repeat ESR higher than before.   Her symptoms are most likely not triggered by any allergies but she does have elevated CU which means her body is producing more histamine than normal. This can definitely contribute to her itching/rash.  Alcohol, getting overheated, pain medications can also worsen these symptoms.   Discussed with patient that ESR is a nonspecific inflammatory marker and it can be elevated for various reasons including infection, trauma, obesity.  Will send results to PCP.   Get bloodwork 2 weeks before next visit.   Patient concerned if her gastric bypass has anything to do with this - I told patient I'm unable to answer that question as that is not my area of expertise.  Allergies do not cause elevation in ESR.   Continue zyrtec (cetirizine) 86m twice a day.  If rash/itching is not controlled or causes drowsiness let uKoreaknow. .Marland KitchenSTART famotidine 279mtwice a day. o Discussed that this is not for her reflux but more for the antihistamine properties.  . Avoid the following potential triggers: alcohol, tight clothing, NSAIDs, hot showers and getting overheated. . Continue proper skin care - moisturize daily.  . Take pictures of the rash. . Marland Kitcheneep track of episodes.

## 2021-01-30 NOTE — Patient Instructions (Addendum)
Skin:  Not sure why your SED rate is elevated. I will send the results to your PCP and if needed will order some additional bloodwork. Will recheck in 2 months. Continue zyrtec (cetirizine) 10mg  twice a day. If rash/itching is not controlled or causes drowsiness let us know. START famotidine 20mg  twice a day. Avoid the following potential triggers: alcohol, tight clothing, NSAIDs, hot showers and getting overheated. Continue proper skin care - moisturize daily.   Take pictures of the rash. Keep track of episodes.  Food: Continue to avoid foods that are bothersome - tuna.  Follow up in 2 months or sooner if needed.  Ge bloodwork drawn 1 week before the next appointment - make sure you are feeling well and not having any fevers when getting the bloodwork drawn.  Will mail you the bloodwork order.    Skin care recommendations  Bath time: Always use lukewarm water. AVOID very hot or cold water. Keep bathing time to 5-10 minutes. Do NOT use bubble bath. Use a mild soap and use just enough to wash the dirty areas. Do NOT scrub skin vigorously.  After bathing, pat dry your skin with a towel. Do NOT rub or scrub the skin.  Moisturizers and prescriptions:  ALWAYS apply moisturizers immediately after bathing (within 3 minutes). This helps to lock-in moisture. Use the moisturizer several times a day over the whole body. Good summer moisturizers include: Aveeno, CeraVe, Cetaphil. Good winter moisturizers include: Aquaphor, Vaseline, Cerave, Cetaphil, Eucerin, Vanicream. When using moisturizers along with medications, the moisturizer should be applied about one hour after applying the medication to prevent diluting effect of the medication or moisturize around where you applied the medications. When not using medications, the moisturizer can be continued twice daily as maintenance.  Laundry and clothing: Avoid laundry products with added color or perfumes. Use unscented hypo-allergenic  laundry products such as Tide free, Cheer free & gentle, and All free and clear.  If the skin still seems dry or sensitive, you can try double-rinsing the clothes. Avoid tight or scratchy clothing such as wool. Do not use fabric softeners or dyer sheets.  Reducing Pollen Exposure Pollen seasons: trees (spring), grass (summer) and ragweed/weeds (fall). Keep windows closed in your home and car to lower pollen exposure.  Install air conditioning in the bedroom and throughout the house if possible.  Avoid going out in dry windy days - especially early morning. Pollen counts are highest between 5 - 10 AM and on dry, hot and windy days.  Save outside activities for late afternoon or after a heavy rain, when pollen levels are lower.  Avoid mowing of grass if you have grass pollen allergy. Be aware that pollen can also be transported indoors on people and pets.  Dry your clothes in an automatic dryer rather than hanging them outside where they might collect pollen.  Rinse hair and eyes before bedtime.

## 2021-01-30 NOTE — Assessment & Plan Note (Signed)
Past history - Currently avoiding tuna and declined in office food challenge in the past - had negative skin prick testing and bloodwork. Noticed shortness of breath x 2 after eating pineapple recently. Resolved within minutes. Some fresh fruits cause some itching. 2022 skin prick testing was negative to foods. Interim history - no issues with pineapple. . Continue to avoid foods that are bothersome - tuna.

## 2021-01-30 NOTE — Progress Notes (Signed)
Follow Up Note  RE: Crystal Garza MRN: [id number] DOB: 07/08/1970 Date of Office Visit: 01/30/2021  Referring provider: Dickie La, MD Primary care provider: Dickie La, MD  Chief Complaint: Follow-up  History of Present Illness: I had the pleasure of seeing Crystal Garza for a follow up visit at the [facility] on 01/30/2021. She is a 50 y.o. female, who is being followed for pruritus, adverse food reaction, non-allergic rhinitis. Her previous allergy office visit was on 11/28/2020 with Dr. Maudie Mercury. Today is a regular follow up visit.  Patient's appointment was at 11:15AM and she states she has another appointment at 11:45AM.  Pruritus Itching is slightly improved and occurs about 4 days per week. No triggers noted. Sometimes has a rash and sometimes does not.   Patient had colonoscopy in August. Denies any recent infections, steroids when the bloodwork was drawn on 9/27 - elevated ESR at 86.   Currently on zyrtec 67m twice a day.  Did not start famotidine as she was concerned about taking too much medicine for heartburn/reflux as she is already taking Dexilant.  She is also concerned whether her gastric bypass may have triggered these symptoms.   Other adverse food reactions No issues with pineapple.   Component     Latest Ref Rng & Units 12/03/2016 11/13/2020 01/28/2021  Sed Rate     0 - 40 mm/hr 32 55 (H) 86 (H)    Assessment and Plan: JLizis a 50y.o. female with: Pruritus Past history - Breaking out in rash for the past 4 years about 2 times per week. No change since onset. Outbreaks last 1-2 days. No specific triggers noted but concerned about developing new allergies. 2018 intradermal testing positive to grass, ragweed, weed, trees, mold, cat; 2018 skin prick testing negative to select foods. 2018 bloodwork - negative to seafood panel, CU index, tryptase, ANA, ESR, coffee. Elevated crp. 2022 skin testing showed: Negative to environmental allergy  panel, food panel. 2022 bloodwork - CBC diff, CMP, crp, TSH, ANA, tryptase, alpha gal were all normal. ESR and CU elevated. Interim history - still has itching 4 days per week. No triggers noted. Did not try Pepcid. Repeat ESR higher than before.  Her symptoms are most likely not triggered by any allergies but she does have elevated CU which means her body is producing more histamine than normal. This can definitely contribute to her itching/rash. Alcohol, getting overheated, pain medications can also worsen these symptoms.  Discussed with patient that ESR is a nonspecific inflammatory marker and it can be elevated for various reasons including infection, trauma, obesity. Will send results to PCP.  Get bloodwork 2 weeks before next visit.  Patient concerned if her gastric bypass has anything to do with this - I told patient I'm unable to answer that question as that is not my area of expertise. Allergies do not cause elevation in ESR.  Continue zyrtec (cetirizine) 133mtwice a day. If rash/itching is not controlled or causes drowsiness let usKoreanow. START famotidine 2065mwice a day. Discussed that this is not for her reflux but more for the antihistamine properties.  Avoid the following potential triggers: alcohol, tight clothing, NSAIDs, hot showers and getting overheated. Continue proper skin care - moisturize daily.  Take pictures of the rash. Keep track of episodes.  Other adverse food reactions, not elsewhere classified, subsequent encounter Past history - Currently avoiding tuna and declined in office food challenge in the past - had  negative skin prick testing and bloodwork. Noticed shortness of breath x 2 after eating pineapple recently. Resolved within minutes. Some fresh fruits cause some itching. 2022 skin prick testing was negative to foods. Interim history - no issues with pineapple. Continue to avoid foods that are bothersome - tuna.  Return in about 2 months (around  04/01/2021).  Meds ordered this encounter  Medications   famotidine (PEPCID) 20 MG tablet    Sig: Take 1 tablet (20 mg total) by mouth 2 (two) times daily.    Dispense:  60 tablet    Refill:  3    Lab Orders         Sedimentation rate         Comprehensive metabolic panel         C-reactive protein         CBC with Differential/Platelet      Diagnostics: None.  Medication List:  Current Outpatient Medications  Medication Sig Dispense Refill   ALPRAZolam (XANAX) 1 MG tablet Take by mouth one in AM one at midday and 2 at bedtime as needed for anxiety 120 tablet 5   cetirizine (ZYRTEC ALLERGY) 10 MG tablet Take 1 tablet (10 mg total) by mouth 2 (two) times daily. 60 tablet 5   dexlansoprazole (DEXILANT) 60 MG capsule Take 1 capsule (60 mg total) by mouth daily. Crack open capsule before ingestion 90 capsule 3   dicyclomine (BENTYL) 10 MG capsule Take 1 capsule (10 mg total) by mouth every 8 (eight) hours as needed for spasms. 30 capsule 3   linaclotide (LINZESS) 145 MCG CAPS capsule Take 1 capsule (145 mcg total) by mouth daily before breakfast. 90 capsule 0   lisinopril (ZESTRIL) 10 MG tablet TAKE 1 TABLET EACH DAY. 90 tablet 3   ondansetron (ZOFRAN-ODT) 4 MG disintegrating tablet Take 1 tablet (4 mg total) by mouth 2 (two) times daily as needed for nausea or vomiting. 60 tablet 3   oxyCODONE-acetaminophen (PERCOCET) 10-325 MG tablet TAKE ONE TABLET EVERY SIX HOURS AS NEEDED FOR CHRONIC PAIN. 120 tablet 0   solifenacin (VESICARE) 5 MG tablet Take 5 mg by mouth daily.     XARELTO 10 MG TABS tablet TAKE 1 TABLET ONCE DAILY. 90 tablet 3   famotidine (PEPCID) 20 MG tablet Take 1 tablet (20 mg total) by mouth 2 (two) times daily. 60 tablet 3   No current facility-administered medications for this visit.   Allergies: Allergies  Allergen Reactions   Codeine Rash   Tuna [Fish Allergy] Rash   I reviewed her past medical history, social history, family history, and environmental  history and no significant changes have been reported from her previous visit.  Review of Systems  Constitutional:  Negative for appetite change, chills, fever and unexpected weight change.  HENT:  Negative for congestion and rhinorrhea.   Eyes:  Negative for itching.  Respiratory:  Negative for cough, chest tightness, shortness of breath and wheezing.   Cardiovascular:  Negative for chest pain.  Gastrointestinal:  Negative for abdominal pain.  Genitourinary:  Negative for difficulty urinating.  Skin:  Negative for rash.       itching  Neurological:  Negative for headaches.   Objective: BP 122/64 (BP Location: Left Arm, Patient Position: Sitting, Cuff Size: Large)   Pulse 70   Temp 97.9 F (36.6 C) (Temporal)   Resp 16   SpO2 100%  There is no height or weight on file to calculate BMI. Physical Exam Vitals and nursing note reviewed.  Constitutional:      Appearance: Normal appearance. She is well-developed.  HENT:     Head: Normocephalic and atraumatic.     Right Ear: External ear normal.     Left Ear: External ear normal.     Nose: Nose normal.     Mouth/Throat:     Mouth: Mucous membranes are moist.     Pharynx: Oropharynx is clear.  Eyes:     Conjunctiva/sclera: Conjunctivae normal.  Cardiovascular:     Rate and Rhythm: Normal rate and regular rhythm.     Heart sounds: Normal heart sounds. No murmur heard.   No friction rub. No gallop.  Pulmonary:     Effort: Pulmonary effort is normal.     Breath sounds: Normal breath sounds. No wheezing, rhonchi or rales.  Musculoskeletal:     Cervical back: Neck supple.  Skin:    General: Skin is warm.     Findings: No rash.  Neurological:     Mental Status: She is alert and oriented to person, place, and time.  Psychiatric:        Behavior: Behavior normal.   Previous notes and tests were reviewed. The plan was reviewed with the patient/family, and all questions/concerned were addressed.  It was my pleasure to see Crystal Garza  today and participate in her care. Please feel free to contact me with any questions or concerns.  Sincerely,  Rexene Alberts, DO Allergy & Immunology  [facility] office: [place] office: [phone]

## 2021-02-01 ENCOUNTER — Other Ambulatory Visit: Payer: Self-pay | Admitting: Family Medicine

## 2021-02-01 ENCOUNTER — Other Ambulatory Visit: Payer: Self-pay | Admitting: Gastroenterology

## 2021-02-05 DIAGNOSIS — M793 Panniculitis, unspecified: Secondary | ICD-10-CM | POA: Diagnosis not present

## 2021-02-07 ENCOUNTER — Ambulatory Visit (INDEPENDENT_AMBULATORY_CARE_PROVIDER_SITE_OTHER): Payer: Medicare Other | Admitting: Family Medicine

## 2021-02-07 DIAGNOSIS — M5412 Radiculopathy, cervical region: Secondary | ICD-10-CM | POA: Diagnosis not present

## 2021-02-07 DIAGNOSIS — Z7901 Long term (current) use of anticoagulants: Secondary | ICD-10-CM

## 2021-02-07 MED ORDER — OXYCODONE-ACETAMINOPHEN 10-325 MG PO TABS: ORAL_TABLET | ORAL | 0 refills | Status: DC

## 2021-02-07 MED ORDER — DULOXETINE HCL 20 MG PO CPEP: 20.0000 mg | ORAL_CAPSULE | Freq: Every day | ORAL | 3 refills | Status: DC

## 2021-02-07 NOTE — Progress Notes (Signed)
  Crystal Garza - 50 y.o. female MRN [id number]  Date of birth: 14-Mar-1971    SUBJECTIVE:      Chief Complaint:/ HPI:  #1.  Follow-up chronic neck and upper back pain.  Pain medicine is still helping but she is having a lot of breakthrough pain.  She does not really want to increase her pain medications but wonders what differently we could do. 2.  Has questions about some of the blood work her allergist did.  Her sed rate was elevated and the allergist told her to check with her PCP. 3.  Increasing problems with numbness in her left hand which has been previously diagnosed with carpal tunnel syndrome which.  She is not sure why it is more bothersome right now but she is having a lot of numbness.  She wonders if the additional issues might be coming from her neck. #4.  She has seen her general surgeon about some abdominal pain and they are planning on doing some type of procedure because they are concerned about necrosis of fat in her abdominal wall.  They told her that is what is been causing her abdominal pain. 5.  Questions about chronic anticoagulation.    OBJECTIVE: Ht 5\' 7"  (1.702 m)   Wt 284 lb (128.8 kg)   BMI 44.48 kg/m   Physical Exam:  Vital signs are reviewed. GENERAL: Well-developed female no acute distress HANDS: Grip strength bilateral symmetrical.  No thenar wasting.  Positive Tinel left.  IMAGING: MRI cervical spine November 2020, spondylosis most notable at C4-C5.  Severe right and mild left neuroforaminal narrowing at that level.  New central disc protrusion C5-C6 causing mild central canal stenosis. ASSESSMENT & PLAN:  See problem based charting & AVS for pt instructions. Chronic anticoagulation Discussed anticoagulation with her.  Given her history of GIST tumor, prior DVT, prior PE elevated BMI, would recommend lifelong anticoagulation.  Cervical radiculopathy at C6 Regarding the paresthesias in her left hand, she is previously been diagnosed with carpal tunnel so  that will obviously be contributing. Reviewed previous MRI . Would  Recommend f/u w her hand surgeon first.  Chronic pain Add back cymbalta and f/u 1-2 m. Refilled pain med

## 2021-02-07 NOTE — Assessment & Plan Note (Addendum)
Discussed anticoagulation with her.  Given her history of GIST tumor, prior DVT, prior PE elevated BMI, would recommend lifelong anticoagulation.

## 2021-02-07 NOTE — Assessment & Plan Note (Signed)
Regarding the paresthesias in her left hand, she is previously been diagnosed with carpal tunnel so that will obviously be contributing. Reviewed previous MRI . Would  Recommend f/u w her hand surgeon first.

## 2021-02-07 NOTE — Assessment & Plan Note (Signed)
Add back cymbalta and f/u 1-2 m. Refilled pain med

## 2021-02-26 ENCOUNTER — Ambulatory Visit (INDEPENDENT_AMBULATORY_CARE_PROVIDER_SITE_OTHER): Payer: Medicare Other | Admitting: Physician Assistant

## 2021-02-26 ENCOUNTER — Other Ambulatory Visit: Payer: Self-pay

## 2021-02-26 ENCOUNTER — Encounter: Payer: Self-pay | Admitting: Physician Assistant

## 2021-02-26 ENCOUNTER — Ambulatory Visit: Payer: Self-pay

## 2021-02-26 DIAGNOSIS — G5602 Carpal tunnel syndrome, left upper limb: Secondary | ICD-10-CM

## 2021-02-26 DIAGNOSIS — Z96652 Presence of left artificial knee joint: Secondary | ICD-10-CM | POA: Diagnosis not present

## 2021-02-26 NOTE — Progress Notes (Signed)
Office Visit Note   Patient: Crystal Garza           Date of Birth: [birth year]           MRN: [id number] Visit Date: 02/26/2021              Requested by: Dickie La, MD [address] [place],  [place] [address] PCP: Dickie La, MD   Assessment & Plan: Visit Diagnoses:  1. Hx of total knee replacement, left   2. Carpal tunnel syndrome, left upper limb     Plan:  Given the fact that she has had at least moderate Carpal tunnel syndrome of the left wrist and the fact that her symptoms are getting worse recommend surgery.  Surgery carpal tunnel release.  She understands risk benefits surgery and she has had right carpal tunnel surgery in the past.  Regards to her knee we will send her to physical therapy for quad strengthening.  Reassurance was given her knee feels stable on exam and radiographs showed no evidence of loosening or concerning features.  Follow-Up Instructions: Return Postop.   Orders:  Orders Placed This Encounter  Procedures   XR Knee 1-2 Views Left   No orders of the defined types were placed in this encounter.     Procedures: No procedures performed   Clinical Data: No additional findings.   Subjective: Chief Complaint  Patient presents with   Left Knee - Pain   Left Hand - Numbness    HPI Crystal Garza comes in today with left hand pain and left knee pain.  She has known moderate carpal tunnel syndrome on the left with EMG nerve conduction studies studies done in 2016.  She states that her hand numbness is becoming worse on the left.  She has had previous right carpal tunnel release done by hand surgeon here in town done well.  She states holding any objects she her hand goes numb at this point time.  She is having to shake the hand at times due to the numbness.  Notes numbness in the hand does awaken her. History of the left total knee arthroplasty performed in 2015 by Dr. Ninfa Linden did well until recently.  Over the past month she has had  sensation of the knee giving out on her.  She has had no known injury.  No swelling no fevers chills. Review of Systems See HPI  Objective: Vital Signs: There were no vitals taken for this visit.  Physical Exam General well-developed well-nourished female no acute distress mood and affect appropriate. Psych: Alert and oriented x3 Ortho Exam Left hand no rashes skin lesions ulcerations.  She has positive Tinel's over the median nerve.  Phalen's is negative. Left knee well-healed surgical incision.  No instability valgus varus stressing.  Anterior drawer there is slight laxity.  No abnormal warmth erythema or effusion in the knee. Specialty Comments:  No specialty comments available.  Imaging: No results found.   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   History of pulmonary embolism 05/25/2017   Esophageal  dysphagia    Family history of colon cancer    Macromastia 02/27/2015   Chronic neck and back pain 02/27/2015   Medication management 08/22/2014   Chronic anticoagulation 05/17/2014   Degenerative arthritis of left knee 10/06/2013   Status post total knee replacement 10/06/2013   Chronic pain 06/13/2013   Lap Sleeve Gastrectomy May 2014 09/16/2012   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/17/2012   Nephrolithiasis 02/20/2011   Iron deficiency anemia 06/27/2010   Anxiety 06/27/2010   Migraine 09/12/2009   Morbid obesity-BMI 62 10/30/2008   Major depressive disorder, recurrent episode ([place]) 07/01/2006   HYPERTENSION,  BENIGN SYSTEMIC 07/01/2006   Other allergic rhinitis 07/01/2006   Gastroesophageal reflux disease 07/01/2006   Past Medical History:  Diagnosis Date   Anemia    iron def   Anginal pain ([place])    Anxiety    Arthritis    left knee    Back pain    Benign gastrointestinal stromal tumor (GIST)    Chronic knee pain    Depression    DVT (deep venous thrombosis) (H[facility])    Esophageal dysmotility    GERD (gastroesophageal reflux disease)    Hiatal hernia    History of kidney stones    Hx of laparoscopic gastric banding    Hypertension    Internal hemorrhoids    Internal hemorrhoids    Migraine    Morbid obesity ([place])    PE (pulmonary embolism)     Family History  Problem Relation Age of Onset   Diabetes Mother    Diabetes Father    Heart disease Father    Colon cancer Father        brain tumor   Colon polyps Father    Colitis Father    Irritable bowel syndrome Father    Diabetes Maternal Grandmother    Diabetes Maternal Grandfather    Schizophrenia Maternal Grandfather    Heart disease Paternal Uncle    Diabetes Paternal Uncle    Heart disease Paternal Grandmother    Heart disease Paternal Grandfather    Diabetes Maternal Aunt    Diabetes Maternal Uncle    Diabetes Paternal Aunt    Cancer Other    Hypertension Other    Esophageal cancer Neg Hx     Past Surgical History:  Procedure Laterality Date   68 HOUR [place] STUDY N/A 05/13/2016   Procedure: 24 HOUR [place] STUDY;  Surgeon: Manus Gunning, MD;  Location: [facility];  Service: Gastroenterology;  Laterality: N/A;   ABDOMINAL HYSTERECTOMY     BIOPSY  02/16/2012   Procedure: BIOPSY;  Surgeon: [name], MD;  Location: [facility];  Service: General;;  biopsy of mass x 2   BREATH TEK H PYLORI  07/28/2011   Procedure: BREATH TEK H PYLORI;  Surgeon: [name], MD;  Location: [facility];  Service: General;  Laterality: N/A;  to be done at [place]  05/1991   COLONOSCOPY WITH PROPOFOL N/A  03/03/2016   Procedure: COLONOSCOPY WITH PROPOFOL;  Surgeon: Manus Gunning, MD;  Location: [facility];  Service: Gastroenterology;  Laterality: N/A;   ESOPHAGEAL MANOMETRY N/A 05/13/2016   Procedure: ESOPHAGEAL MANOMETRY (EM);  Surgeon: Manus Gunning, MD;  [facility];  Service: Gastroenterology;  Laterality: N/A;   ESOPHAGOGASTRODUODENOSCOPY N/A 08/29/2012   Procedure: ESOPHAGOGASTRODUODENOSCOPY (EGD);  Surgeon: [name], MD;  [facility];  Service: General;  Laterality: N/A;   ESOPHAGOGASTRODUODENOSCOPY N/A 09/04/2014   Procedure:  ESOPHAGOGASTRODUODENOSCOPY (EGD);  Surgeon: Alphonsa Overall, MD;  Location: [facility];  Service: General;  Laterality: N/A;   ESOPHAGOGASTRODUODENOSCOPY (EGD) WITH PROPOFOL N/A 03/03/2016   Procedure: ESOPHAGOGASTRODUODENOSCOPY (EGD) WITH PROPOFOL;  Surgeon: Manus Gunning, MD;  [facility];  Service: Gastroenterology;  Laterality: N/A;   EUS  03/03/2012   Procedure: UPPER ENDOSCOPIC ULTRASOUND (EUS) LINEAR;  Surgeon: Milus Banister, MD;  [facility];  Service: Endoscopy;  Laterality: N/A;   GASTRIC ROUX-EN-Y N/A 12/07/2017   Procedure: LAPAROSCOPIC ROUX-EN-Y GASTRIC BYPASS WITH LYSIS OF ADHESIONS AND UPPER ENDOSCOPY ERAS Pathway;  Surgeon: Johnathan Hausen, MD;  Location: [facility];  Service: General;  Laterality: N/A;   gist     KNEE ARTHROSCOPY     LAPAROSCOPIC GASTRECTOMY  04/08/2012   Procedure: LAPAROSCOPIC GASTRECTOMY;  Surgeon: [name], MD;  Location: [facility];  Service: General;;  removal of GIST tumor of stomach   LAPAROSCOPIC GASTRIC SLEEVE RESECTION N/A 08/29/2012   Procedure: LAPAROSCOPIC GASTRIC SLEEVE RESECTION;  Surgeon: [name], MD;  Location: [facility];  Service: General;  Laterality: N/A;  Sleeve Gastrectomy   LEFT HEART CATH AND CORONARY ANGIOGRAPHY N/A 05/28/2017   Procedure: LEFT HEART CATH AND CORONARY ANGIOGRAPHY;  Surgeon: Leonie Man, MD;  Location: [facility];  Service: Cardiovascular;  Laterality: N/A;   ROUX-EN-Y GASTRIC BYPASS     TOTAL KNEE ARTHROPLASTY Left 10/06/2013   Procedure: LEFT TOTAL KNEE ARTHROPLASTY;  Surgeon: Mcarthur Rossetti, MD;  Location: [facility];  Service: Orthopedics;  Laterality: Left;   TUBAL LIGATION     Social History   Occupational History   Occupation: CNA , does Home Health  Tobacco Use   Smoking status: Never   Smokeless tobacco: Never  Vaping Use   Vaping Use: Never used  Substance and Sexual Activity   Alcohol use: No    Alcohol/week: 0.0 standard drinks   Drug use: No   Sexual activity: Yes    Birth control/protection: Surgical

## 2021-02-27 NOTE — Addendum Note (Signed)
Addended by: Robyne Peers on: 02/27/2021 08:09 AM   Modules accepted: Orders

## 2021-03-03 ENCOUNTER — Other Ambulatory Visit: Payer: Self-pay

## 2021-03-04 ENCOUNTER — Other Ambulatory Visit: Payer: Self-pay | Admitting: Family Medicine

## 2021-03-04 DIAGNOSIS — F419 Anxiety disorder, unspecified: Secondary | ICD-10-CM

## 2021-03-14 ENCOUNTER — Ambulatory Visit: Payer: Medicare Other

## 2021-03-19 ENCOUNTER — Ambulatory Visit: Payer: Medicare Other

## 2021-03-19 ENCOUNTER — Ambulatory Visit (INDEPENDENT_AMBULATORY_CARE_PROVIDER_SITE_OTHER): Payer: Medicare Other

## 2021-03-19 ENCOUNTER — Other Ambulatory Visit: Payer: Self-pay

## 2021-03-19 DIAGNOSIS — Z23 Encounter for immunization: Secondary | ICD-10-CM

## 2021-03-19 DIAGNOSIS — Z111 Encounter for screening for respiratory tuberculosis: Secondary | ICD-10-CM | POA: Diagnosis not present

## 2021-03-19 NOTE — Progress Notes (Signed)
Tuberculin skin test applied to left ventral forearm. Patient to return on 11/18 to have site read.  Reminder card given.

## 2021-03-20 ENCOUNTER — Ambulatory Visit: Payer: Medicare Other | Attending: Physician Assistant | Admitting: Physical Therapy

## 2021-03-20 ENCOUNTER — Encounter: Payer: Self-pay | Admitting: Physical Therapy

## 2021-03-20 DIAGNOSIS — M25562 Pain in left knee: Secondary | ICD-10-CM | POA: Diagnosis not present

## 2021-03-20 DIAGNOSIS — Z96652 Presence of left artificial knee joint: Secondary | ICD-10-CM | POA: Diagnosis not present

## 2021-03-20 DIAGNOSIS — M6281 Muscle weakness (generalized): Secondary | ICD-10-CM | POA: Diagnosis not present

## 2021-03-20 DIAGNOSIS — G8929 Other chronic pain: Secondary | ICD-10-CM | POA: Diagnosis not present

## 2021-03-20 DIAGNOSIS — R262 Difficulty in walking, not elsewhere classified: Secondary | ICD-10-CM | POA: Insufficient documentation

## 2021-03-20 NOTE — Patient Instructions (Addendum)
Access Code [email or web address]  Aquatic Therapy at [facility]-  What to Expect!  Where:   [facility] @ [address], [place] [address] Rehab phone [phone]  NOTE:  You will receive an automated phone message reminding you of your appt and it will say the appointment is at the [facility] clinic.          How to Prepare: Please make sure you drink 8 ounces of water about one hour prior to your pool session A caregiver may attend if needed with the patient to help assist as needed. A caregiver can sit in the pool room on chair. Please arrive IN YOUR SUIT and 15 minutes prior to your appointment - this helps to avoid delays in starting your session. Please make sure to attend to any toileting needs prior to entering the pool [place] rooms for changing are provided.   There is direct access to the pool deck form the locker room.  You can lock your belongings in a locker with lock provided. Once on the pool deck your therapist will ask if you have signed the Patient  Consent and Assignment of Benefits form before beginning treatment Your therapist may take your blood pressure prior to, during and after your session if indicated We usually try and create a home exercise program based on activities we do in the pool.  Please be thinking about who might be able to assist you in the pool should you need to participate in an aquatic home exercise program at the time of discharge if you need assistance.  Some patients do not want to or do not have the ability to participate in an aquatic home program - this is not a barrier in any way to you participating in aquatic therapy as part of your current therapy plan! After Discharge from PT, you can continue using home program at  the [facility], there is a drop-in fee for $5 ($45 a month)or for 60 years  or older $4.00 ($40 a month for seniors ) or any local [facility] pool.   Memberships for purchase are available for gym/pool at [facility] LAST AQUATIC VISIT.  PLEASE MAKE SURE THAT YOU HAVE A [address]  APPOINTMENT SCHEDULED.   About the pool: Pool is located approximately 500 FT from the entrance of the building.  Please bring a support person if you need assistance traveling this      distance.   Your therapist will assist you in entering the water; there are two ways to           enter: stairs with railings, and a mechanical lift. Your therapist will determine the most appropriate way for you.  Water temperature is usually between 88-90 degrees  There may be up to 2 other swimmers in the pool at the same time  The pool deck is tile, please wear shoes with good traction if you prefer not to be barefoot.    Contact Info:  For appointment scheduling and cancellations:         Please call the [facility] @ [facility]       All sessions are 45 minutes

## 2021-03-20 NOTE — Therapy (Signed)
[place], [place], [address] Phone: [phone]   Fax:  [phone]  Physical Therapy Evaluation  Patient Details  Name: Crystal Garza MRN: [id number] Date of Birth: [birth year] Referring Provider (PT): Erskine Emery, [place]   Encounter Date: 03/20/2021   PT End of Session - 03/20/21 1120     Visit Number 1    Number of Visits 8    Date for PT Re-Evaluation 05/15/21    Authorization Type UHC MCR, MCD    PT Start Time 94    PT Stop Time 1101    PT Time Calculation (min) 42 min    Activity Tolerance Patient tolerated treatment well    Behavior During Therapy [facility] for tasks assessed/performed             Past Medical History:  Diagnosis Date   Anemia    iron def   Anginal pain ([place])    Anxiety    Arthritis    left knee    Back pain    Benign gastrointestinal stromal tumor (GIST)    Chronic knee pain    Depression    DVT (deep venous thrombosis) (HCC)    Esophageal dysmotility    GERD (gastroesophageal reflux disease)    Hiatal hernia    History of kidney stones    Hx of laparoscopic gastric banding    Hypertension    Internal hemorrhoids    Internal hemorrhoids    Migraine    Morbid obesity ([place])    PE (pulmonary embolism)     Past Surgical History:  Procedure Laterality Date   29 HOUR [place] STUDY N/A 05/13/2016   Procedure: 24 HOUR [place] STUDY;  Surgeon: Manus Gunning, MD;  [facility];  Service: Gastroenterology;  Laterality: N/A;   ABDOMINAL HYSTERECTOMY     BIOPSY  02/16/2012   Procedure: BIOPSY;  Surgeon: [name], MD;  [facility];  Service: General;;  biopsy of mass x 2   BREATH TEK H PYLORI  07/28/2011   Procedure: BREATH TEK H PYLORI;  Surgeon: [name], MD;  [facility];  Service: General;  Laterality: N/A;  to be done at [place]  05/1991   COLONOSCOPY WITH PROPOFOL N/A 03/03/2016   Procedure: COLONOSCOPY WITH  PROPOFOL;  Surgeon: Manus Gunning, MD;  [facility];  Service: Gastroenterology;  Laterality: N/A;   ESOPHAGEAL MANOMETRY N/A 05/13/2016   Procedure: ESOPHAGEAL MANOMETRY (EM);  Surgeon: Manus Gunning, MD;  [facility];  Service: Gastroenterology;  Laterality: N/A;   ESOPHAGOGASTRODUODENOSCOPY N/A 08/29/2012   Procedure: ESOPHAGOGASTRODUODENOSCOPY (EGD);  Surgeon: [name], MD;  [facility];  Service: General;  Laterality: N/A;   ESOPHAGOGASTRODUODENOSCOPY N/A 09/04/2014   Procedure: ESOPHAGOGASTRODUODENOSCOPY (EGD);  Surgeon: Alphonsa Overall, MD;  [facility];  Service: General;  Laterality: N/A;   ESOPHAGOGASTRODUODENOSCOPY (EGD) WITH PROPOFOL N/A 03/03/2016   Procedure: ESOPHAGOGASTRODUODENOSCOPY (EGD) WITH PROPOFOL;  Surgeon: Manus Gunning, MD;  [facility];  Service: Gastroenterology;  Laterality: N/A;   EUS  03/03/2012   Procedure: UPPER ENDOSCOPIC ULTRASOUND (EUS) LINEAR;  Surgeon: Milus Banister, MD;  [facility];  Service: Endoscopy;  Laterality: N/A;   GASTRIC ROUX-EN-Y N/A 12/07/2017   Procedure: LAPAROSCOPIC ROUX-EN-Y GASTRIC BYPASS WITH LYSIS OF ADHESIONS AND UPPER ENDOSCOPY ERAS Pathway;  Surgeon: Johnathan Hausen, MD;  [facility];  Service: General;  Laterality: N/A;   gist     KNEE  ARTHROSCOPY     LAPAROSCOPIC GASTRECTOMY  04/08/2012   Procedure: LAPAROSCOPIC GASTRECTOMY;  Surgeon: [name], MD;  [facility];  Service: General;;  removal of GIST tumor of stomach   LAPAROSCOPIC GASTRIC SLEEVE RESECTION N/A 08/29/2012   Procedure: LAPAROSCOPIC GASTRIC SLEEVE RESECTION;  Surgeon: [name], MD;  [facility];  Service: General;  Laterality: N/A;  Sleeve Gastrectomy   LEFT HEART CATH AND CORONARY ANGIOGRAPHY N/A 05/28/2017   Procedure: LEFT HEART CATH AND CORONARY ANGIOGRAPHY;  Surgeon: Leonie Man, MD;  Location: [facility];  Service: Cardiovascular;   Laterality: N/A;   ROUX-EN-Y GASTRIC BYPASS     TOTAL KNEE ARTHROPLASTY Left 10/06/2013   Procedure: LEFT TOTAL KNEE ARTHROPLASTY;  Surgeon: Mcarthur Rossetti, MD;  [facility];  Service: Orthopedics;  Laterality: Left;   TUBAL LIGATION      There were no vitals filed for this visit.    Subjective Assessment - 03/20/21 1025     Subjective Patient had a L TKR in 2015.  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  When she walks it "wobbles" laterally, this has been going on for 1 yr. . She is definitely doing more walking and her knee aches.  L thigh felt  swollen some time ago, none now.  She has moderate knee pain which is daily. She feels like she is dragging it and at time she has trouble going up and down stairs.  She does regular walking about 3 x per week for 30 min but no strength exercises.    Pertinent History obesity with gastric bypass, arthritis , knee TKA    Limitations Walking;Lifting;Standing;House hold activities;Other (comment)   sleeping   Diagnostic tests XR showed no actue abnormalities 10/22    Patient Stated Goals Wants to ride a bike one day.  Get stronger.    Currently in Pain? Yes    Pain Score 5     Pain Location Knee    Pain Orientation Left;Medial    Pain Descriptors / Indicators Aching    Pain Type Chronic pain    Pain Radiating Towards calf at times    Pain Onset More than a month ago    Pain Frequency Constant    Aggravating Factors  standing, walking    Pain Relieving Factors rest    Effect of Pain on Daily Activities limited activity    Multiple Pain Sites No                [facility] PT Assessment - 03/20/21 0001       Assessment   Medical Diagnosis L knee h/o TKA    Referring Provider (PT) Erskine Emery, PA    Onset Date/Surgical Date --   2015   Prior Therapy Yes      Precautions   Precautions None      Restrictions   Weight Bearing Restrictions No      Balance  Screen   Has the patient fallen in the past 6 months No    Has the patient had a decrease in activity level because of a fear of falling?  No   lacks confidence   Is the patient reluctant to leave their home because of a fear of falling?  No      Home Environment   Living Environment Private residence    Living Arrangements Children;Non-relatives/Friends;Spouse/significant other    Type  of [place] to enter    Entrance Stairs-Number of Steps 7    Entrance Stairs-Rails Right    Home Layout One level    [place] - single point    Additional Comments does not use      Prior Function   Level of Independence Independent    Vocation Part time employment    Vocation Requirements walking halls, transfers and bathing    Leisure has friends, bowling, cooking      Cognition   Overall Cognitive Status Within Functional Limits for tasks assessed      Observation/Other Assessments   Focus on Therapeutic Outcomes (FOTO)  53%      Sensation   Light Touch Appears Intact      Functional Tests   Functional tests Squat;Single leg stance      Squat   Comments FW trunk lean , poor alignment      Single Leg Stance   Comments < 5 sec bilateral      Posture/Postural Control   Posture Comments genu valgus , trunk flexed      AROM   Right Knee Extension 0    Right Knee Flexion 125    Left Knee Extension 4    Left Knee Flexion 92      Strength   Right Hip ABduction 3+/5    Left Hip ABduction 3+/5    Right Knee Flexion 5/5    Right Knee Extension 4+/5    Left Knee Flexion 5/5    Left Knee Extension 4+/5      Palpation   Palpation comment medial joint soreness      Special Tests   Other special tests normal stability testing      Transfers   Five time sit to stand comments  17 sec    Comments uses trunk flexion              Performed HEP x 10 reps: SLR, SLR with hip ER Sidelying hip abd Bridging    Objective measurements completed on  examination: See above findings.      PT Education - 03/20/21 1120     Education Details PT/POC, aquatics, HEP    Person(s) Educated Patient    Methods Explanation;Handout    Comprehension Verbalized understanding;Returned demonstration;Need further instruction              PT Short Term Goals - 03/20/21 1121       PT SHORT TERM GOAL #1   Title Pt will understand FOTO results and potential to improve her condition.    Time 4    Period Weeks    Status New    Target Date 04/17/21      PT SHORT TERM GOAL #2   Title Pt will be I with basic HEP for L knee and hips    Time 4    Period Weeks    Status New    Target Date 04/17/21      PT SHORT TERM GOAL #3   Title Pt will report stiffness , pain in knee 20-25% better with sit to stand, AM hours    Time 4    Period Weeks    Status New    Target Date 04/17/21               PT Long Term Goals - 03/20/21 1124       PT LONG TERM GOAL #1   Title Pt  will be able to walk in the grocery store without feelings of instability    Time 8    Period Weeks    Status New    Target Date 05/15/21      PT LONG TERM GOAL #2   Title Pt will be able to walk up and down the stairs with no increased pain in L knee using 1 rail.    Time 8    Period Weeks    Status New    Target Date 05/15/21      PT LONG TERM GOAL #3   Title Pt will be able to show proper squat without increased knee pain for transfers    Time 8    Period Weeks    Status New    Target Date 05/15/21      PT LONG TERM GOAL #4   Title Pt will be able to bend her L knee to 100 deg or more to promote proper squat mechanics.    Baseline 92 deg    Time 8    Period Weeks    Status New    Target Date 05/15/21      PT LONG TERM GOAL #5   Title FOTO score will improve to 60 % or more to demo improved functional mobility    Time 8    Period Weeks    Status New    Target Date 05/15/21                    Plan - 03/20/21 1054     Clinical  Impression Statement Patient presents for low complexity eval of chronic knee issues due to TKR in 2015.  She noticed feelings of instability when walking and it seems more of a medial/lateral symptom.  Her L quad is 4+/5 but her hips are very weak.  She is interested in aquatics as a way to improve tolerance for closed chain strengthening.  She need to be able to walk and squat for her work dutied as well. She should do quite well with reinforcment of principles and a structured HEP.    Personal Factors and Comorbidities Comorbidity 3+;Time since onset of injury/illness/exacerbation;Profession    Comorbidities obesity, TKA, carpal tunnel, obesity    Examination-Activity Limitations Stairs;Stand;Transfers;Sleep;Lift;Locomotion Level;Squat;Carry    Examination-Participation Restrictions Interpersonal Relationship;Occupation;Laundry;Cleaning;Community Activity;Shop    Stability/Clinical Decision Making Stable/Uncomplicated    Clinical Decision Making Low    Rehab Potential Excellent    PT Frequency 1x / week    PT Duration 8 weeks    PT Treatment/Interventions Aquatic Therapy;ADLs/Self Care Home Management;Moist Heat;Stair training;Functional mobility training;Therapeutic activities;Therapeutic exercise;Balance training;Patient/family education;Manual techniques    PT Next Visit Plan check HEP, NuStep standing ther ex as tol.    PT Home Exercise Plan Access Code [email or web address]    Consulted and Agree with Plan of Care Patient             Patient will benefit from skilled therapeutic intervention in order to improve the following deficits and impairments:  Decreased range of motion, Decreased strength, Increased fascial restricitons, Hypomobility, Improper body mechanics, Pain, Difficulty walking, Decreased mobility, Decreased endurance, Obesity  Visit Diagnosis: Chronic pain of left knee  History of total left knee replacement     Problem List Patient Active Problem List   Diagnosis Date  Noted   Pruritus 11/28/2020   Chronic rhinitis 11/28/2020   Rash and other nonspecific skin eruption 11/05/2020   Other adverse food reactions, not elsewhere classified, subsequent encounter  11/05/2020   Hx of food allergy 09/13/2020   Healthcare maintenance 09/13/2020   Constipation due to pain medication 03/06/2020   Cervical radiculopathy at C6 03/29/2019   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   History of pulmonary embolism 05/25/2017   Esophageal dysphagia    Family history of colon cancer    Macromastia 02/27/2015   Chronic neck and back pain 02/27/2015   Medication management 08/22/2014   Chronic anticoagulation 05/17/2014   Degenerative arthritis of left knee 10/06/2013   Status post total knee replacement 10/06/2013   Chronic pain 06/13/2013   Lap Sleeve Gastrectomy May 2014 09/16/2012   Gastrointestinal stromal tumor (GIST) of stomach ([employer]) 02/17/2012   Nephrolithiasis 02/20/2011   Iron deficiency anemia 06/27/2010   Anxiety 06/27/2010   Migraine 09/12/2009   Morbid obesity-BMI 62 10/30/2008   Major depressive disorder, recurrent episode ([place]) 07/01/2006   HYPERTENSION, BENIGN SYSTEMIC 07/01/2006   Other allergic rhinitis 07/01/2006   Gastroesophageal reflux disease 07/01/2006    Esaw Knippel, PT 03/20/2021, 11:39 AM  [facility] [address] [place], [place], [address] Phone: [phone]   Fax:  [phone]  Name: Crystal Garza MRN: [id number] Date of Birth: 04/14/1971  Raeford Razor, PT 03/20/21 11:40 AM Phone: [phone] Fax: [phone]

## 2021-03-21 ENCOUNTER — Other Ambulatory Visit: Payer: Self-pay

## 2021-03-21 ENCOUNTER — Ambulatory Visit: Payer: Medicare Other

## 2021-03-21 DIAGNOSIS — Z111 Encounter for screening for respiratory tuberculosis: Secondary | ICD-10-CM

## 2021-03-21 LAB — TB SKIN TEST
Induration: 0 mm
TB Skin Test: NEGATIVE

## 2021-03-21 NOTE — Progress Notes (Signed)
PPD Reading Note  PPD read and results entered in EpicCare.  Result: 0 mm induration.  Interpretation: Negative  Allergic reaction: no

## 2021-03-26 ENCOUNTER — Other Ambulatory Visit: Payer: Self-pay | Admitting: Family Medicine

## 2021-03-29 ENCOUNTER — Other Ambulatory Visit: Payer: Self-pay | Admitting: Family Medicine

## 2021-03-31 ENCOUNTER — Encounter: Payer: Self-pay | Admitting: Allergy

## 2021-03-31 ENCOUNTER — Other Ambulatory Visit: Payer: Self-pay | Admitting: Family Medicine

## 2021-03-31 ENCOUNTER — Telehealth: Payer: Self-pay | Admitting: *Deleted

## 2021-03-31 NOTE — Telephone Encounter (Signed)
Error

## 2021-03-31 NOTE — Telephone Encounter (Signed)
Called and spoke with the patient and she has been scheduled to come and see Dr. Maudie Mercury. Lab work has been mailed to the patients home.

## 2021-04-01 ENCOUNTER — Other Ambulatory Visit: Payer: Self-pay | Admitting: Family Medicine

## 2021-04-01 DIAGNOSIS — F419 Anxiety disorder, unspecified: Secondary | ICD-10-CM

## 2021-04-02 ENCOUNTER — Other Ambulatory Visit: Payer: Self-pay

## 2021-04-02 ENCOUNTER — Other Ambulatory Visit: Payer: Self-pay | Admitting: Family Medicine

## 2021-04-02 ENCOUNTER — Ambulatory Visit: Payer: Medicare Other | Admitting: Physical Therapy

## 2021-04-02 ENCOUNTER — Encounter: Payer: Self-pay | Admitting: Physical Therapy

## 2021-04-02 DIAGNOSIS — G8929 Other chronic pain: Secondary | ICD-10-CM | POA: Diagnosis not present

## 2021-04-02 DIAGNOSIS — R262 Difficulty in walking, not elsewhere classified: Secondary | ICD-10-CM

## 2021-04-02 DIAGNOSIS — M6281 Muscle weakness (generalized): Secondary | ICD-10-CM | POA: Diagnosis not present

## 2021-04-02 DIAGNOSIS — F419 Anxiety disorder, unspecified: Secondary | ICD-10-CM

## 2021-04-02 DIAGNOSIS — Z96652 Presence of left artificial knee joint: Secondary | ICD-10-CM | POA: Diagnosis not present

## 2021-04-02 DIAGNOSIS — M25562 Pain in left knee: Secondary | ICD-10-CM | POA: Diagnosis not present

## 2021-04-02 NOTE — Therapy (Signed)
Crystal Garza, [place], [address] Phone: [phone]   Fax:  [phone]  Physical Therapy Treatment  Patient Details  Name: Crystal Garza MRN: [id number] Date of Birth: [birth year] Referring Provider (PT): Erskine Emery, [place]   Encounter Date: 04/02/2021   PT End of Session - 04/02/21 0730     Visit Number 2    Number of Visits 8    Date for PT Re-Evaluation 05/15/21    Authorization Type UHC MCR, MCD    PT Start Time 0719    PT Stop Time 0800    PT Time Calculation (min) 41 min             Past Medical History:  Diagnosis Date   Anemia    iron def   Anginal pain ([place])    Anxiety    Arthritis    left knee    Back pain    Benign gastrointestinal stromal tumor (GIST)    Chronic knee pain    Depression    DVT (deep venous thrombosis) (HCC)    Esophageal dysmotility    GERD (gastroesophageal reflux disease)    Hiatal hernia    History of kidney stones    Hx of laparoscopic gastric banding    Hypertension    Internal hemorrhoids    Internal hemorrhoids    Migraine    Morbid obesity ([place])    PE (pulmonary embolism)     Past Surgical History:  Procedure Laterality Date   39 HOUR [place] STUDY N/A 05/13/2016   Procedure: 24 HOUR [place] STUDY;  Surgeon: Manus Gunning, MD;  Location: [facility];  Service: Gastroenterology;  Laterality: N/A;   ABDOMINAL HYSTERECTOMY     BIOPSY  02/16/2012   Procedure: BIOPSY;  Surgeon: [name], MD;  [facility];  Service: General;;  biopsy of mass x 2   BREATH TEK H PYLORI  07/28/2011   Procedure: BREATH TEK H PYLORI;  Surgeon: [name], MD;  [facility];  Service: General;  Laterality: N/A;  to be done at [place]  05/1991   COLONOSCOPY WITH PROPOFOL N/A 03/03/2016   Procedure: COLONOSCOPY WITH PROPOFOL;  Surgeon: Manus Gunning, MD;  [facility];  Service: Gastroenterology;  Laterality: N/A;    ESOPHAGEAL MANOMETRY N/A 05/13/2016   Procedure: ESOPHAGEAL MANOMETRY (EM);  Surgeon: Manus Gunning, MD;  [facility];  Service: Gastroenterology;  Laterality: N/A;   ESOPHAGOGASTRODUODENOSCOPY N/A 08/29/2012   Procedure: ESOPHAGOGASTRODUODENOSCOPY (EGD);  Surgeon: [name], MD;  [facility];  Service: General;  Laterality: N/A;   ESOPHAGOGASTRODUODENOSCOPY N/A 09/04/2014   Procedure: ESOPHAGOGASTRODUODENOSCOPY (EGD);  Surgeon: Alphonsa Overall, MD;  [facility];  Service: General;  Laterality: N/A;   ESOPHAGOGASTRODUODENOSCOPY (EGD) WITH PROPOFOL N/A 03/03/2016   Procedure: ESOPHAGOGASTRODUODENOSCOPY (EGD) WITH PROPOFOL;  Surgeon: Manus Gunning, MD;  [facility];  Service: Gastroenterology;  Laterality: N/A;   EUS  03/03/2012   Procedure: UPPER ENDOSCOPIC ULTRASOUND (EUS) LINEAR;  Surgeon: Milus Banister, MD;  [facility];  Service: Endoscopy;  Laterality: N/A;   GASTRIC ROUX-EN-Y N/A 12/07/2017   Procedure: LAPAROSCOPIC ROUX-EN-Y GASTRIC BYPASS WITH LYSIS OF ADHESIONS AND UPPER ENDOSCOPY ERAS Pathway;  Surgeon: Johnathan Hausen, MD;  [facility];  Service: General;  Laterality: N/A;   gist     KNEE ARTHROSCOPY     LAPAROSCOPIC GASTRECTOMY  04/08/2012   Procedure: LAPAROSCOPIC GASTRECTOMY;  Surgeon: [name],  MD;  [facility];  Service: General;;  removal of GIST tumor of stomach   LAPAROSCOPIC GASTRIC SLEEVE RESECTION N/A 08/29/2012   Procedure: LAPAROSCOPIC GASTRIC SLEEVE RESECTION;  Surgeon: [name], MD;  [facility];  Service: General;  Laterality: N/A;  Sleeve Gastrectomy   LEFT HEART CATH AND CORONARY ANGIOGRAPHY N/A 05/28/2017   Procedure: LEFT HEART CATH AND CORONARY ANGIOGRAPHY;  Surgeon: Leonie Man, MD;  Location: [facility];  Service: Cardiovascular;  Laterality: N/A;   ROUX-EN-Y GASTRIC BYPASS     TOTAL KNEE ARTHROPLASTY Left 10/06/2013   Procedure: LEFT TOTAL KNEE  ARTHROPLASTY;  Surgeon: Mcarthur Rossetti, MD;  [facility];  Service: Orthopedics;  Laterality: Left;   TUBAL LIGATION      There were no vitals filed for this visit.   Subjective Assessment - 04/02/21 0725     Subjective I just worked 16 hours. I am having more pain than usual. 8/10.    Currently in Pain? Yes    Pain Score 8     Pain Location Knee    Pain Orientation Left    Pain Descriptors / Indicators Aching    Pain Type Chronic pain    Aggravating Factors  working, standing and walking    Pain Relieving Factors rest, heat             [facility] Adult PT Treatment/Exercise:  Therapeutic Exercise: - Nustep  x 6 minutes L3  - LAQ 10 x 2 bilat -bridge x 10 x 2 - SLR 10 x 2 , bilat - Hip abduction 10 x 2, bilat     PT Short Term Goals - 03/20/21 1121       PT SHORT TERM GOAL #1   Title Pt will understand FOTO results and potential to improve her condition.    Time 4    Period Weeks    Status New    Target Date 04/17/21      PT SHORT TERM GOAL #2   Title Pt will be I with basic HEP for L knee and hips    Time 4    Period Weeks    Status New    Target Date 04/17/21      PT SHORT TERM GOAL #3   Title Pt will report stiffness , pain in knee 20-25% better with sit to stand, AM hours    Time 4    Period Weeks    Status New    Target Date 04/17/21               PT Long Term Goals - 03/20/21 1124       PT LONG TERM GOAL #1   Title Pt will be able to walk in the grocery store without feelings of instability    Time 8    Period Weeks    Status New    Target Date 05/15/21      PT LONG TERM GOAL #2   Title Pt will be able to walk up and down the stairs with no increased pain in L knee using 1 rail.    Time 8    Period Weeks    Status New    Target Date 05/15/21      PT LONG TERM GOAL #3   Title Pt will be able to show proper squat without increased knee pain for transfers    Time 8    Period Weeks    Status New  Target Date 05/15/21       PT LONG TERM GOAL #4   Title Pt will be able to bend her L knee to 100 deg or more to promote proper squat mechanics.    Baseline 92 deg    Time 8    Period Weeks    Status New    Target Date 05/15/21      PT LONG TERM GOAL #5   Title FOTO score will improve to 60 % or more to demo improved functional mobility    Time 8    Period Weeks    Status New    Target Date 05/15/21                   Plan - 04/02/21 1749     Clinical Impression Statement Pt reports compliance with some HEP that she could remember. Reprinted and reviewed her HEP. She is just off work from 16 hour shift so session was spent on Nustep and open chain focus. Reprinted aquatic info and scheduled her 2 aquatic visits in the future. She will be out of town some weeks so visits are intermittent. Encouraged pt to be compliant with HEP. She tolerated therex well with fatigue, no increased pain.    PT Treatment/Interventions Aquatic Therapy;ADLs/Self Care Home Management;Moist Heat;Stair training;Functional mobility training;Therapeutic activities;Therapeutic exercise;Balance training;Patient/family education;Manual techniques    PT Next Visit Plan check HEP, NuStep standing ther ex as tol, how was aquatics?    PT Home Exercise Plan Access Code [email or web address]             Patient will benefit from skilled therapeutic intervention in order to improve the following deficits and impairments:  Decreased range of motion, Decreased strength, Increased fascial restricitons, Hypomobility, Improper body mechanics, Pain, Difficulty walking, Decreased mobility, Decreased endurance, Obesity  Visit Diagnosis: History of total left knee replacement  Chronic pain of left knee  Difficulty in walking, not elsewhere classified  Muscle weakness (generalized)     Problem List Patient Active Problem List   Diagnosis Date Noted   Pruritus 11/28/2020   Chronic rhinitis 11/28/2020   Rash and other nonspecific skin  eruption 11/05/2020   Other adverse food reactions, not elsewhere classified, subsequent encounter 11/05/2020   Hx of food allergy 09/13/2020   Healthcare maintenance 09/13/2020   Constipation due to pain medication 03/06/2020   Cervical radiculopathy at C6 03/29/2019   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   History of pulmonary embolism 05/25/2017   Esophageal dysphagia    Family history of colon cancer    Macromastia 02/27/2015   Chronic neck and back pain 02/27/2015   Medication management 08/22/2014   Chronic anticoagulation 05/17/2014   Degenerative arthritis of left knee 10/06/2013   Status post total knee replacement 10/06/2013   Chronic pain 06/13/2013   Lap Sleeve Gastrectomy May 2014 09/16/2012   Gastrointestinal stromal tumor (GIST) of stomach ([place]) 02/17/2012   Nephrolithiasis 02/20/2011   Iron deficiency anemia 06/27/2010   Anxiety 06/27/2010   Migraine 09/12/2009   Morbid obesity-BMI 62 10/30/2008   Major depressive disorder, recurrent episode ([place]) 07/01/2006   HYPERTENSION, BENIGN SYSTEMIC 07/01/2006   Other allergic rhinitis 07/01/2006   Gastroesophageal reflux disease 07/01/2006    Dorene Ar, PTA 04/02/2021, 8:27 AM  [facility] [address] [place], [place], [address] Phone: [phone]   Fax:  [phone]  Name: DEVORY MCKINZIE MRN: [id number] Date of Birth: 1970/10/05

## 2021-04-04 ENCOUNTER — Encounter: Payer: Self-pay | Admitting: *Deleted

## 2021-04-04 DIAGNOSIS — L299 Pruritus, unspecified: Secondary | ICD-10-CM | POA: Diagnosis not present

## 2021-04-04 DIAGNOSIS — R899 Unspecified abnormal finding in specimens from other organs, systems and tissues: Secondary | ICD-10-CM | POA: Diagnosis not present

## 2021-04-05 LAB — CBC WITH DIFFERENTIAL/PLATELET
Basophils Absolute: 0.1 10*3/uL (ref 0.0–0.2)
Basos: 1 %
EOS (ABSOLUTE): 0.1 10*3/uL (ref 0.0–0.4)
Eos: 1 %
Hematocrit: 37.5 % (ref 34.0–46.6)
Hemoglobin: 12.2 g/dL (ref 11.1–15.9)
Immature Grans (Abs): 0 10*3/uL (ref 0.0–0.1)
Immature Granulocytes: 0 %
Lymphocytes Absolute: 3 10*3/uL (ref 0.7–3.1)
Lymphs: 32 %
MCH: 26.5 pg — ABNORMAL LOW (ref 26.6–33.0)
MCHC: 32.5 g/dL (ref 31.5–35.7)
MCV: 81 fL (ref 79–97)
Monocytes Absolute: 0.7 10*3/uL (ref 0.1–0.9)
Monocytes: 8 %
Neutrophils Absolute: 5.5 10*3/uL (ref 1.4–7.0)
Neutrophils: 58 %
Platelets: 492 10*3/uL — ABNORMAL HIGH (ref 150–450)
RBC: 4.61 x10E6/uL (ref 3.77–5.28)
RDW: 13.8 % (ref 11.7–15.4)
WBC: 9.4 10*3/uL (ref 3.4–10.8)

## 2021-04-05 LAB — SEDIMENTATION RATE: Sed Rate: 84 mm/hr — ABNORMAL HIGH (ref 0–40)

## 2021-04-05 LAB — COMPREHENSIVE METABOLIC PANEL
ALT: 8 IU/L (ref 0–32)
AST: 16 IU/L (ref 0–40)
Albumin/Globulin Ratio: 1.2 (ref 1.2–2.2)
Albumin: 3.8 g/dL (ref 3.8–4.9)
Alkaline Phosphatase: 86 IU/L (ref 44–121)
BUN/Creatinine Ratio: 10 (ref 9–23)
BUN: 7 mg/dL (ref 6–24)
Bilirubin Total: 0.5 mg/dL (ref 0.0–1.2)
CO2: 21 mmol/L (ref 20–29)
Calcium: 9.3 mg/dL (ref 8.7–10.2)
Chloride: 103 mmol/L (ref 96–106)
Creatinine, Ser: 0.67 mg/dL (ref 0.57–1.00)
Globulin, Total: 3.1 g/dL (ref 1.5–4.5)
Glucose: 77 mg/dL (ref 70–99)
Potassium: 3.8 mmol/L (ref 3.5–5.2)
Sodium: 141 mmol/L (ref 134–144)
Total Protein: 6.9 g/dL (ref 6.0–8.5)
eGFR: 105 mL/min/{1.73_m2} (ref >59)

## 2021-04-05 LAB — C-REACTIVE PROTEIN: CRP: 6 mg/L (ref 0–10)

## 2021-04-07 ENCOUNTER — Telehealth: Payer: Self-pay

## 2021-04-07 ENCOUNTER — Other Ambulatory Visit: Payer: Self-pay

## 2021-04-07 ENCOUNTER — Encounter: Payer: Self-pay | Admitting: Allergy

## 2021-04-07 ENCOUNTER — Ambulatory Visit (INDEPENDENT_AMBULATORY_CARE_PROVIDER_SITE_OTHER): Payer: Medicare Other | Admitting: Allergy

## 2021-04-07 VITALS — BP 110/70 | HR 90 | Temp 93.7°F | Ht 67.01 in | Wt 284.0 lb

## 2021-04-07 DIAGNOSIS — T781XXD Other adverse food reactions, not elsewhere classified, subsequent encounter: Secondary | ICD-10-CM

## 2021-04-07 DIAGNOSIS — L299 Pruritus, unspecified: Secondary | ICD-10-CM

## 2021-04-07 MED ORDER — FAMOTIDINE 20 MG PO TABS: 20.0000 mg | ORAL_TABLET | Freq: Two times a day (BID) | ORAL | 3 refills | Status: DC

## 2021-04-07 NOTE — Telephone Encounter (Signed)
Please refer patient to Rheumatology for joint pain and elevated SED Rate

## 2021-04-07 NOTE — Progress Notes (Signed)
Follow Up Note  RE: Crystal Garza MRN: [id number] DOB: 06/15/1970 Date of Office Visit: 04/07/2021  Referring provider: Dickie La, MD Primary care provider: Dickie La, MD  Chief Complaint: Follow-up (No changes since last visit. )  History of Present Illness: I had the pleasure of seeing Crystal Garza for a follow up visit at the [facility] on 04/07/2021. She is a 50 y.o. female, who is being followed for pruritus and adverse food reaction. Her previous allergy office visit was on 01/30/2021 with Dr. Maudie Mercury. Today is a regular follow up visit.  Pruritus Itching is unchanged. Occasionally has a mild rash but did not take pictures.  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 She is not sure if the meds are helpful.   Patient was scheduled for some type of abdominal surgery but it was cancelled. Patient had gastric bypass in the past.   Reviewed bloodwork still has elevated Sed rate. She apparently saw rheumatology in the past for joint pains and her joint pain is the same as well.  Food Avoiding tuna.   Assessment and Plan: [name] a [age].o. female with: Pruritus Past history - Breaking out in rash for the past 4 years about 2 times per week. No change since onset. Outbreaks last 1-2 days. No specific triggers noted but concerned about developing new allergies. 2018 intradermal testing positive to grass, ragweed, weed, trees, mold, cat; 2018 skin prick testing negative to select foods. 2018 bloodwork - negative to seafood panel, CU index, tryptase, ANA, ESR, coffee. Elevated crp. 2022 skin testing showed: Negative to environmental allergy panel, food panel. 2022 bloodwork - CBC diff, CMP, crp, TSH, ANA, tryptase, alpha gal were all normal. ESR and CU elevated. Interim history - still has itching but only taking zyrtec and famotidine once a day. Reviewed bloodwork and ESR still  elevated. Has joint pains and saw rheumatology in the past.  Her symptoms are most likely not triggered by any allergies but she does have elevated CU which means her body is producing more histamine than normal. This can definitely contribute to her itching/rash. Alcohol, getting overheated, pain medications can also worsen these symptoms.  Discussed with patient that ESR is a nonspecific inflammatory marker. Will send results to PCP.  Refer to rheumatology for joint pain and elevated SED rate. Switch to allegra 18077mwice a day instead of zyrtec.  Samples given.  If rash/itching is not controlled or causes drowsiness let us [place]. START famotidine 58m23mice a day. Continue proper skin care - moisturize daily.  Take pictures of the rash. Keep track of episodes.  Other adverse food reactions, not elsewhere classified, subsequent encounter Past history - Currently avoiding tuna and declined in office food challenge in the past - had negative skin prick testing and bloodwork. Noticed shortness of breath x 2 after eating pineapple recently. Resolved within minutes. Some fresh fruits cause some itching. 2022 skin prick testing was negative to foods. Continue to avoid foods that are bothersome - tuna.  Return in about 3 months (around 07/06/2021).  Meds ordered this encounter  Medications   famotidine (PEPCID) 20 MG tablet    Sig: Take 1 tablet (20 mg total) by mouth 2 (two) times daily.    Dispense:  60 tablet    Refill:  3    Lab Orders  No laboratory test(s) ordered today  Diagnostics: None.  Medication List:  Current Outpatient Medications  Medication Sig Dispense Refill   ALPRAZolam (XANAX) 1 MG tablet TAKE ONE TABLET BY MOUTH EVERY MORNING, TAKE ONE TABLET MIDDAY AND TAKE TWO TABLETS AT BEDTIME AS NEEDED FOR ANXIETY 120 tablet 0   DEXILANT 60 MG capsule TAKE ONE CAPSULE DAILY. 90 capsule 1   dicyclomine (BENTYL) 10 MG capsule Take 1 capsule (10 mg total) by mouth every 8  (eight) hours as needed for spasms. 30 capsule 3   DULoxetine (CYMBALTA) 20 MG capsule Take 1 capsule (20 mg total) by mouth daily. 90 capsule 3   famotidine (PEPCID) 20 MG tablet Take 1 tablet (20 mg total) by mouth 2 (two) times daily. 60 tablet 3   LINZESS 145 MCG CAPS capsule TAKE ONE CAPSULE BY MOUTH ONCE DAILY daily BEFORE breakfast. 90 capsule 1   lisinopril (ZESTRIL) 10 MG tablet TAKE 1 TABLET EACH DAY. 90 tablet 3   ondansetron (ZOFRAN-ODT) 4 MG disintegrating tablet Take 1 tablet (4 mg total) by mouth 2 (two) times daily as needed for nausea or vomiting. 60 tablet 3   oxyCODONE-acetaminophen (PERCOCET) 10-325 MG tablet TAKE ONE TABLET BY MOUTH EVERY 6 HOURS AS NEEDED CHRONIC PAIN 120 tablet 0   solifenacin (VESICARE) 5 MG tablet Take 5 mg by mouth daily.     XARELTO 10 MG TABS tablet TAKE 1 TABLET ONCE DAILY. 90 tablet 3   No current facility-administered medications for this visit.   Allergies: Allergies  Allergen Reactions   Codeine Rash   Tuna [Fish Allergy] Rash   I reviewed her past medical history, social history, family history, and environmental history and no significant changes have been reported from her previous visit.  Review of Systems  Constitutional:  Negative for appetite change, chills, fever and unexpected weight change.  HENT:  Negative for congestion and rhinorrhea.   Eyes:  Negative for itching.  Respiratory:  Negative for cough, chest tightness, shortness of breath and wheezing.   Cardiovascular:  Negative for chest pain.  Gastrointestinal:  Negative for abdominal pain.  Genitourinary:  Negative for difficulty urinating.  Musculoskeletal:  Positive for arthralgias.  Skin:  Negative for rash.       itching  Neurological:  Negative for headaches.   Objective: BP 110/70   Pulse 90   Temp (!) 93.7 F (34.3 C) (Temporal)   Ht 5' 7.01" (1.702 m)   Wt 283 lb 15.2 oz (128.8 kg)   SpO2 100%   BMI 44.46 kg/m  Body mass index is 44.46 kg/m. Physical  Exam Vitals and nursing note reviewed.  Constitutional:      Appearance: Normal appearance. She is well-developed.  HENT:     Head: Normocephalic and atraumatic.     Right Ear: External ear normal.     Left Ear: External ear normal.     Nose: Nose normal.     Mouth/Throat:     Mouth: Mucous membranes are moist.     Pharynx: Oropharynx is clear.  Eyes:     Conjunctiva/sclera: Conjunctivae normal.  Cardiovascular:     Rate and Rhythm: Normal rate and regular rhythm.     Heart sounds: Normal heart sounds. No murmur heard.   No friction rub. No gallop.  Pulmonary:     Effort: Pulmonary effort is normal.     Breath sounds: Normal breath sounds. No wheezing, rhonchi or rales.  Musculoskeletal:     Cervical back: Neck supple.  Skin:    General: Skin is warm.  Findings: No rash.  Neurological:     Mental Status: She is alert and oriented to person, place, and time.  Psychiatric:        Behavior: Behavior normal.   Previous notes and tests were reviewed. The plan was reviewed with the patient/family, and all questions/concerned were addressed.  It was my pleasure to see Clema today and participate in her care. Please feel free to contact me with any questions or concerns.  Sincerely,  Rexene Alberts, DO Allergy & Immunology  [facility] office: [place] office: [phone]

## 2021-04-07 NOTE — Assessment & Plan Note (Signed)
Past history - Currently avoiding tuna and declined in office food challenge in the past - had negative skin prick testing and bloodwork. Noticed shortness of breath x 2 after eating pineapple recently. Resolved within minutes. Some fresh fruits cause some itching. 2022 skin prick testing was negative to foods. . Continue to avoid foods that are bothersome - tuna.

## 2021-04-07 NOTE — Assessment & Plan Note (Signed)
Past history - Breaking out in rash for the past 4 years about 2 times per week. No change since onset. Outbreaks last 1-2 days. No specific triggers noted but concerned about developing new allergies. 2018 intradermal testing positive to grass, ragweed, weed, trees, mold, cat; 2018 skin prick testing negative to select foods. 2018 bloodwork - negative to seafood panel, CU index, tryptase, ANA, ESR, coffee. Elevated crp. 2022 skin testing showed: Negative to environmental allergy panel, food panel. 2022 bloodwork - CBC diff, CMP, crp, TSH, ANA, tryptase, alpha gal were all normal. ESR and CU elevated. Interim history - still has itching but only taking zyrtec and famotidine once a day. Reviewed bloodwork and ESR still elevated. Has joint pains and saw rheumatology in the past.   Her symptoms are most likely not triggered by any allergies but she does have elevated CU which means her body is producing more histamine than normal. This can definitely contribute to her itching/rash.  Alcohol, getting overheated, pain medications can also worsen these symptoms.   Discussed with patient that ESR is a nonspecific inflammatory marker.  Will send results to PCP.   Refer to rheumatology for joint pain and elevated SED rate.  Switch to allegra 117m twice a day instead of zyrtec.   Samples given.   If rash/itching is not controlled or causes drowsiness let [place]. .Marland KitchenSTART famotidine 252mtwice a day. . Continue proper skin care - moisturize daily.  . Take pictures of the rash. . Marland Kitcheneep track of episodes.

## 2021-04-07 NOTE — Patient Instructions (Addendum)
Skin:  Not sure why your SED rate is elevated. I will send the results to your PCP. I'm going to refer you to rheumatology for joint pain and elevated SED rate. Switch to allegra 180mg  twice a day instead of zyrtec.  Samples given.  If rash/itching is not controlled or causes drowsiness let us know. START famotidine 20mg  twice a day. Avoid the following potential triggers: alcohol, tight clothing, NSAIDs, hot showers and getting overheated. Continue proper skin care - moisturize daily.   Take pictures of the rash. Keep track of episodes.  Food: Continue to avoid foods that are bothersome - tuna.  Follow up in 3-4 months or sooner if needed.    Skin care recommendations  Bath time: Always use lukewarm water. AVOID very hot or cold water. Keep bathing time to 5-10 minutes. Do NOT use bubble bath. Use a mild soap and use just enough to wash the dirty areas. Do NOT scrub skin vigorously.  After bathing, pat dry your skin with a towel. Do NOT rub or scrub the skin.  Moisturizers and prescriptions:  ALWAYS apply moisturizers immediately after bathing (within 3 minutes). This helps to lock-in moisture. Use the moisturizer several times a day over the whole body. Good summer moisturizers include: Aveeno, CeraVe, Cetaphil. Good winter moisturizers include: Aquaphor, Vaseline, Cerave, Cetaphil, Eucerin, Vanicream. When using moisturizers along with medications, the moisturizer should be applied about one hour after applying the medication to prevent diluting effect of the medication or moisturize around where you applied the medications. When not using medications, the moisturizer can be continued twice daily as maintenance.  Laundry and clothing: Avoid laundry products with added color or perfumes. Use unscented hypo-allergenic laundry products such as Tide free, Cheer free & gentle, and All free and clear.  If the skin still seems dry or sensitive, you can try double-rinsing the  clothes. Avoid tight or scratchy clothing such as wool. Do not use fabric softeners or dyer sheets.

## 2021-04-08 ENCOUNTER — Encounter: Payer: Medicare Other | Admitting: Physical Therapy

## 2021-04-09 ENCOUNTER — Ambulatory Visit: Payer: Medicare Other | Attending: Physician Assistant | Admitting: Physical Therapy

## 2021-04-09 ENCOUNTER — Other Ambulatory Visit: Payer: Self-pay

## 2021-04-09 ENCOUNTER — Encounter: Payer: Self-pay | Admitting: Physical Therapy

## 2021-04-09 DIAGNOSIS — R262 Difficulty in walking, not elsewhere classified: Secondary | ICD-10-CM | POA: Diagnosis present

## 2021-04-09 DIAGNOSIS — Z96652 Presence of left artificial knee joint: Secondary | ICD-10-CM | POA: Diagnosis not present

## 2021-04-09 DIAGNOSIS — M25562 Pain in left knee: Secondary | ICD-10-CM | POA: Insufficient documentation

## 2021-04-09 DIAGNOSIS — G8929 Other chronic pain: Secondary | ICD-10-CM | POA: Insufficient documentation

## 2021-04-09 DIAGNOSIS — M6281 Muscle weakness (generalized): Secondary | ICD-10-CM | POA: Insufficient documentation

## 2021-04-09 NOTE — Therapy (Signed)
[place], [place], [address] Phone: [phone]   Fax:  [phone]  Physical Therapy Treatment  Patient Details  Name: Crystal Garza MRN: [id number] Date of Birth: [birth year] Referring Provider (PT): Erskine Emery, [place]   Encounter Date: 04/09/2021   PT End of Session - 04/09/21 1334     Visit Number 3    Number of Visits 8    Date for PT Re-Evaluation 05/15/21    Authorization Type UHC MCR, MCD    PT Start Time 1330    PT Stop Time 1414    PT Time Calculation (min) 44 min    Activity Tolerance Patient tolerated treatment well    Behavior During Therapy [facility] for tasks assessed/performed             Past Medical History:  Diagnosis Date   Anemia    iron def   Anginal pain ([place])    Anxiety    Arthritis    left knee    Back pain    Benign gastrointestinal stromal tumor (GIST)    Chronic knee pain    Depression    DVT (deep venous thrombosis) (HCC)    Esophageal dysmotility    GERD (gastroesophageal reflux disease)    Hiatal hernia    History of kidney stones    Hx of laparoscopic gastric banding    Hypertension    Internal hemorrhoids    Internal hemorrhoids    Migraine    Morbid obesity ([place])    PE (pulmonary embolism)     Past Surgical History:  Procedure Laterality Date   52 HOUR [place] STUDY N/A 05/13/2016   Procedure: 24 HOUR [place] STUDY;  Surgeon: Manus Gunning, MD;  Location: [facility];  Service: Gastroenterology;  Laterality: N/A;   BIOPSY  02/16/2012   Procedure: BIOPSY;  Surgeon: [name], MD;  Location: [facility];  Service: General;;  biopsy of mass x 2   BREATH TEK H PYLORI  07/28/2011   Procedure: BREATH TEK H PYLORI;  Surgeon: [name], MD;  Location: [facility];  Service: General;  Laterality: N/A;  to be done at [place]  05/1991   COLONOSCOPY WITH PROPOFOL N/A 03/03/2016   Procedure: COLONOSCOPY WITH PROPOFOL;  Surgeon: Manus Gunning, MD;  [facility]: [facility];  Service: Gastroenterology;  Laterality: N/A;   ESOPHAGEAL MANOMETRY N/A 05/13/2016   Procedure: ESOPHAGEAL MANOMETRY (EM);  Surgeon: Manus Gunning, MD;  [facility];  Service: Gastroenterology;  Laterality: N/A;   ESOPHAGOGASTRODUODENOSCOPY N/A 08/29/2012   Procedure: ESOPHAGOGASTRODUODENOSCOPY (EGD);  Surgeon: [name], MD;  [facility];  Service: General;  Laterality: N/A;   ESOPHAGOGASTRODUODENOSCOPY N/A 09/04/2014   Procedure: ESOPHAGOGASTRODUODENOSCOPY (EGD);  Surgeon: Alphonsa Overall, MD;  [facility];  Service: General;  Laterality: N/A;   ESOPHAGOGASTRODUODENOSCOPY (EGD) WITH PROPOFOL N/A 03/03/2016   Procedure: ESOPHAGOGASTRODUODENOSCOPY (EGD) WITH PROPOFOL;  Surgeon: Manus Gunning, MD;  [facility];  Service: Gastroenterology;  Laterality: N/A;   EUS  03/03/2012   Procedure: UPPER ENDOSCOPIC ULTRASOUND (EUS) LINEAR;  Surgeon: Milus Banister, MD;  [facility];  Service: Endoscopy;  Laterality: N/A;   GASTRIC ROUX-EN-Y N/A 12/07/2017   Procedure: LAPAROSCOPIC ROUX-EN-Y GASTRIC BYPASS WITH LYSIS OF ADHESIONS AND UPPER ENDOSCOPY ERAS Pathway;  Surgeon: Johnathan Hausen, MD;  Location: [facility];  Service: General;  Laterality: N/A;   gist     KNEE ARTHROSCOPY     LAPAROSCOPIC  GASTRECTOMY  04/08/2012   Procedure: LAPAROSCOPIC GASTRECTOMY;  Surgeon: [name], MD;  Location: [facility];  Service: General;;  removal of GIST tumor of stomach   LAPAROSCOPIC GASTRIC SLEEVE RESECTION N/A 08/29/2012   Procedure: LAPAROSCOPIC GASTRIC SLEEVE RESECTION;  Surgeon: [name], MD;  [facility];  Service: General;  Laterality: N/A;  Sleeve Gastrectomy   LEFT HEART CATH AND CORONARY ANGIOGRAPHY N/A 05/28/2017   Procedure: LEFT HEART CATH AND CORONARY ANGIOGRAPHY;  Surgeon: Leonie Man, MD;  Location: [facility];  Service: Cardiovascular;  Laterality: N/A;    ROUX-EN-Y GASTRIC BYPASS     TOTAL ABDOMINAL HYSTERECTOMY  04/2004   TOTAL KNEE ARTHROPLASTY Left 10/06/2013   Procedure: LEFT TOTAL KNEE ARTHROPLASTY;  Surgeon: Mcarthur Rossetti, MD;  Location: [facility];  Service: Orthopedics;  Laterality: Left;   TUBAL LIGATION      There were no vitals filed for this visit.   Subjective Assessment - 04/09/21 1332     Subjective I am 7/10 in my left knee.  I took my grandchild to get an infusion and I have been busy    Pertinent History obesity with gastric bypass, arthritis , knee TKA    Limitations Walking;Lifting;Standing;House hold activities;Other (comment)    Currently in Pain? Yes    Pain Score 7     Pain Location Knee    Pain Orientation Left    Pain Descriptors / Indicators Aching;Throbbing    Pain Type Chronic pain    Pain Onset More than a month ago              Aquatic therapy at [address] - therapeutic pool temp 94 degrees Pt enters building without AD with antalgic wide based gait.   Treatment took place in water 3.8 to  4 ft 8 in.feet deep depending upon activity.  Pt entered and exited the pool via stair and handrails independently. Glade Lloyd SPT observed and was assisted with aquastretch  Use of yellow pool noodle for balance in ambulation in pool.at first to acclimate due to pt expressed did not know how to swim in water well.  Ms Allred entered water for aquatic therapy for first time and was introduced to principles and therapeutic effects of water as she ambulated and acclimated to pool. PT encouraging forward translation with heel toe gait and decreased side to side wide based gait.  She was able to use aquatic barbells while ambulating to increase abdominal engagement with exercises.    Runners stretch  2 x 30 sec  Left and then moves into hamstring stretch  Then runners stretch on R  2 x 30 secand then move into hamstring stretch . TC to insure proper technique. Figure 4 squat stretch with  UE support for R and L x 60 sec each        On edge of pool with bil UE support Pt performed LE exercise  Hip abd/add R/L 20 x each and then using 1 UE support Hip ext/flex with knee straight x 20, pt needing VC and TC for correct execution and sequencing Hip circles x 10 each R and L Squats x 20 reps with intermittent UE support x 2 sets. Lunge to target x 10 on R and the x 10 on LE      Aquastretch for Left  quadriceps over lateral quad, rectus and IT band.  Glade Lloyd SPT also assisted with finnishing RX.    Ended Rx Session with ambulation  utilizing aquatic barbells with increased abdominal engagement with more confidence and better gait than when entering        Pt requires the buoyancy of water for active assisted exercises with buoyancy supported for strengthening and AROM exercises: PT  requires the viscosity of the water for resistance with strengthening exercises Hydrostatic pressure also supports joints by unweighting joint load by at least 50 % in 3-4 feet depth water. 80% in chest to neck deep water. Water will allow for  reduced joint loading through buoyancy to help patient improve posture without excess stress and pain.                          PT Education - 04/09/21 1333     Education Details Pt educated on the benefits of aquatic and water properties. 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    Time 4    Period Weeks    Status New    Target Date 04/17/21      PT SHORT TERM GOAL #2   Title Pt will be I with basic HEP for L knee and hips    Time 4    Period Weeks    Status New    Target Date 04/17/21      PT SHORT  TERM GOAL #3   Title Pt will report stiffness , pain in knee 20-25% better with sit to stand, AM hours    Time 4    Period Weeks    Status New    Target Date 04/17/21               PT Long Term Goals - 03/20/21 1124       PT LONG TERM GOAL #1   Title Pt will be able to walk in the grocery store without feelings of instability    Time 8    Period Weeks    Status New    Target Date 05/15/21      PT LONG TERM GOAL #2   Title Pt will be able to walk up and down the stairs with no increased pain in L knee using 1 rail.    Time 8    Period Weeks    Status New    Target Date 05/15/21      PT LONG TERM GOAL #3   Title Pt will be able to show proper squat without increased knee pain for transfers    Time 8    Period Weeks    Status New    Target Date 05/15/21      PT LONG TERM GOAL #4   Title Pt will be able to bend her L knee to 100 deg or more to promote proper squat mechanics.    Baseline 92 deg    Time 8    Period Weeks    Status New    Target Date 05/15/21      PT LONG TERM GOAL #5   Title FOTO score will improve to 60 % or more to demo improved functional mobility    Time 8  Period Weeks    Status New    Target Date 05/15/21                   Plan - 04/09/21 1335     Clinical Impression Statement Pt enters water with 7/10 pain and at end of session has 5/10 pain and decreased antalgic gait  Ms Allred had decreased sharp pain over left knee after aquastretch to rectus femoris L and Lateral IT band.Hydrostatic pressure also supports joints by unweighting joint load by at least 50 % in 3-4 feet depth water. 80% in chest to neck deep water.  Water will allow for  reduced joint loading through buoyancy to help patient improve posture without excess stress and pain.    Personal Factors and Comorbidities Comorbidity 3+;Time since onset of injury/illness/exacerbation;Profession    Comorbidities obesity, TKA, carpal tunnel, obesity    Examination-Activity  Limitations Stairs;Stand;Transfers;Sleep;Lift;Locomotion Level;Squat;Carry    Examination-Participation Restrictions Interpersonal Relationship;Occupation;Laundry;Cleaning;Community Activity;Shop    Rehab Potential Excellent    PT Frequency 1x / week    PT Duration 8 weeks    PT Treatment/Interventions Aquatic Therapy;ADLs/Self Care Home Management;Moist Heat;Stair training;Functional mobility training;Therapeutic activities;Therapeutic exercise;Balance training;Patient/family education;Manual techniques    PT Next Visit Plan check HEP, NuStep standing ther ex as tol, how was aquatics?    PT Home Exercise Plan Access Code [email or web address]    Consulted and Agree with Plan of Care Patient             Patient will benefit from skilled therapeutic intervention in order to improve the following deficits and impairments:  Decreased range of motion, Decreased strength, Increased fascial restricitons, Hypomobility, Improper body mechanics, Pain, Difficulty walking, Decreased mobility, Decreased endurance, Obesity  Visit Diagnosis: History of total left knee replacement  Chronic pain of left knee  Difficulty in walking, not elsewhere classified  Muscle weakness (generalized)     Problem List Patient Active Problem List   Diagnosis Date Noted   Pruritus 11/28/2020   Chronic rhinitis 11/28/2020   Rash and other nonspecific skin eruption 11/05/2020   Other adverse food reactions, not elsewhere classified, subsequent encounter 11/05/2020   Hx of food allergy 09/13/2020   Healthcare maintenance 09/13/2020   Constipation due to pain medication 03/06/2020   Cervical radiculopathy at C6 03/29/2019   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   History of pulmonary embolism 05/25/2017   Esophageal dysphagia    Family history of colon cancer    Macromastia 02/27/2015   Chronic neck and back pain 02/27/2015   Medication management 08/22/2014   Chronic anticoagulation 05/17/2014    Degenerative arthritis of left knee 10/06/2013   Status post total knee replacement 10/06/2013   Chronic pain 06/13/2013   Lap Sleeve Gastrectomy May 2014 09/16/2012   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 [place], [place], [address] Phone: [phone]   Fax:  [phone]  Name: JUDE LINCK MRN: [id number] Date of Birth: 07-04-1970

## 2021-04-16 ENCOUNTER — Encounter: Payer: Self-pay | Admitting: Physical Therapy

## 2021-04-16 ENCOUNTER — Ambulatory Visit: Payer: Medicare Other | Admitting: Physical Therapy

## 2021-04-16 ENCOUNTER — Other Ambulatory Visit: Payer: Self-pay

## 2021-04-16 DIAGNOSIS — M6281 Muscle weakness (generalized): Secondary | ICD-10-CM

## 2021-04-16 DIAGNOSIS — Z96652 Presence of left artificial knee joint: Secondary | ICD-10-CM | POA: Diagnosis not present

## 2021-04-16 DIAGNOSIS — M25562 Pain in left knee: Secondary | ICD-10-CM

## 2021-04-16 DIAGNOSIS — R262 Difficulty in walking, not elsewhere classified: Secondary | ICD-10-CM

## 2021-04-16 DIAGNOSIS — G8929 Other chronic pain: Secondary | ICD-10-CM

## 2021-04-16 NOTE — Therapy (Signed)
[facility] [place], [place], [address] Phone: [phone]   Fax:  [phone]  Physical Therapy Treatment  Patient Details  Name: NIOKA Garza MRN: [id number] Date of Birth: 05/13/1970 Referring Provider (PT): Erskine Emery, [place]   Encounter Date: 04/16/2021   PT End of Session - 04/16/21 0723     Visit Number 4    Number of Visits 8    Date for PT Re-Evaluation 05/15/21    Authorization Type UHC MCR, MCD    PT Start Time 0715             Past Medical History:  Diagnosis Date   Anemia    iron def   Anginal pain ([place])    Anxiety    Arthritis    left knee    Back pain    Benign gastrointestinal stromal tumor (GIST)    Chronic knee pain    Depression    DVT (deep venous thrombosis) (HCC)    Esophageal dysmotility    GERD (gastroesophageal reflux disease)    Hiatal hernia    History of kidney stones    Hx of laparoscopic gastric banding    Hypertension    Internal hemorrhoids    Internal hemorrhoids    Migraine    Morbid obesity ([place])    PE (pulmonary embolism)     Past Surgical History:  Procedure Laterality Date   31 HOUR [place] STUDY N/A 05/13/2016   Procedure: 24 HOUR [place] STUDY;  Surgeon: Manus Gunning, MD;  Location: [facility];  Service: Gastroenterology;  Laterality: N/A;   BIOPSY  02/16/2012   Procedure: BIOPSY;  Surgeon: [name], MD;  [facility]: [facility];  Service: General;;  biopsy of mass x 2   BREATH TEK H PYLORI  07/28/2011   Procedure: BREATH TEK H PYLORI;  Surgeon: [name], MD;  [facility];  Service: General;  Laterality: N/A;  to be done at [facility]  05/1991   COLONOSCOPY WITH PROPOFOL N/A 03/03/2016   Procedure: COLONOSCOPY WITH PROPOFOL;  Surgeon: Manus Gunning, MD;  [facility];  Service: Gastroenterology;  Laterality: N/A;   ESOPHAGEAL MANOMETRY N/A 05/13/2016   Procedure: ESOPHAGEAL MANOMETRY (EM);   Surgeon: Manus Gunning, MD;  [facility];  Service: Gastroenterology;  Laterality: N/A;   ESOPHAGOGASTRODUODENOSCOPY N/A 08/29/2012   Procedure: ESOPHAGOGASTRODUODENOSCOPY (EGD);  Surgeon: [name], MD;  [facility];  Service: General;  Laterality: N/A;   ESOPHAGOGASTRODUODENOSCOPY N/A 09/04/2014   Procedure: ESOPHAGOGASTRODUODENOSCOPY (EGD);  Surgeon: Alphonsa Overall, MD;  Location: [facility];  Service: General;  Laterality: N/A;   ESOPHAGOGASTRODUODENOSCOPY (EGD) WITH PROPOFOL N/A 03/03/2016   Procedure: ESOPHAGOGASTRODUODENOSCOPY (EGD) WITH PROPOFOL;  Surgeon: Manus Gunning, MD;  [facility];  Service: Gastroenterology;  Laterality: N/A;   EUS  03/03/2012   Procedure: UPPER ENDOSCOPIC ULTRASOUND (EUS) LINEAR;  Surgeon: Milus Banister, MD;  [facility];  Service: Endoscopy;  Laterality: N/A;   GASTRIC ROUX-EN-Y N/A 12/07/2017   Procedure: LAPAROSCOPIC ROUX-EN-Y GASTRIC BYPASS WITH LYSIS OF ADHESIONS AND UPPER ENDOSCOPY ERAS Pathway;  Surgeon: Johnathan Hausen, MD;  [facility];  Service: General;  Laterality: N/A;   gist     KNEE ARTHROSCOPY     LAPAROSCOPIC GASTRECTOMY  04/08/2012   Procedure: LAPAROSCOPIC GASTRECTOMY;  Surgeon: [name], MD;  [facility];  Service: General;;  removal of GIST tumor of stomach   LAPAROSCOPIC GASTRIC SLEEVE RESECTION N/A  08/29/2012   Procedure: LAPAROSCOPIC GASTRIC SLEEVE RESECTION;  Surgeon: [name], MD;  [facility];  Service: General;  Laterality: N/A;  Sleeve Gastrectomy   LEFT HEART CATH AND CORONARY ANGIOGRAPHY N/A 05/28/2017   Procedure: LEFT HEART CATH AND CORONARY ANGIOGRAPHY;  Surgeon: Leonie Man, MD;  Location: [facility];  Service: Cardiovascular;  Laterality: N/A;   ROUX-EN-Y GASTRIC BYPASS     TOTAL ABDOMINAL HYSTERECTOMY  04/2004   TOTAL KNEE ARTHROPLASTY Left 10/06/2013   Procedure: LEFT TOTAL KNEE ARTHROPLASTY;  Surgeon: Mcarthur Rossetti, MD;  [facility];  Service: Orthopedics;  Laterality: Left;   TUBAL LIGATION      There were no vitals filed for this visit.   Subjective Assessment - 04/16/21 0733     Subjective I really enjoyed the aquatics. I have come along way. The exercises have strengthened my thighs. Ihave more endurance with walking at work.    Currently in Pain? Yes    Pain Score 6     Pain Location Knee    Pain Orientation Left;Right    Pain Descriptors / Indicators Aching    Pain Type Chronic pain    Aggravating Factors  working, standing, and walking    Pain Relieving Factors bengay, rest , heat                [facility] PT Assessment - 04/16/21 0001       AROM   Left Knee Flexion 95      Strength   Left Hip ABduction 4-/5                    [facility] Adult PT Treatment/Exercise - 04/16/21 0001       Transfers   Five time sit to stand comments  15.3            [facility] Adult PT Treatment/Exercise:  Therapeutic Exercise: - Nustep  x 6 minutes L5 LE only - standing hip abduction and extension 10x 2  -bilat heel raises x 15 - 6 inch step up Left x 10 -bridge x 10 x 2 - SLR 10 x 2 , bilat - Hip abduction 10 x 2, bilat -sit-stand 10 x 2 with cues to equalize weight though LEs  -hip flexor stretch left - EOM 30 sec x 3         PT Education - 04/16/21 0751     Education Details HEP    Person(s) Educated Patient    Methods Explanation;Handout    Comprehension Verbalized understanding              PT Short Term Goals - 04/16/21 0740       PT SHORT TERM GOAL #1   Title Pt will understand FOTO results and potential to improve her condition.    Period Weeks    Status Achieved    Target Date 04/17/21      PT SHORT TERM GOAL #2   Title Pt will be I with basic HEP for L knee and hips    Time 4    Period Weeks    Status Achieved    Target Date 04/17/21      PT SHORT TERM GOAL #3   Title Pt will report stiffness , pain in knee 20-25% better with  sit to stand, AM hours    Time 4    Period Weeks    Status Achieved    Target Date 04/17/21  PT Long Term Goals - 04/16/21 1228       PT LONG TERM GOAL #1   Title Pt will be able to walk in the grocery store without feelings of instability    Time 8    Period Weeks    Status Achieved    Target Date 05/15/21      PT LONG TERM GOAL #2   Title Pt will be able to walk up and down the stairs with no increased pain in L knee using 1 rail.    Baseline constant; 04/16/21- can climb stairs without pain, but has increased pain with descent    Time 8    Period Weeks    Status On-going      PT LONG TERM GOAL #3   Title Pt will be able to show proper squat without increased knee pain for transfers    Baseline 04/16/21: can perform sit-stand without pain- shift weight off of left LE    Time 8    Period Weeks    Status On-going      PT LONG TERM GOAL #4   Title Pt will be able to bend her L knee to 100 deg or more to promote proper squat mechanics.    Baseline 92 deg: 04/16/21- improved to 95    Time 8    Period Weeks    Status On-going      PT LONG TERM GOAL #5   Title FOTO score will improve to 60 % or more to demo improved functional mobility    Time 8    Period Weeks    Status On-going    Target Date 05/15/21                   Plan - 04/16/21 1225     Clinical Impression Statement Taylen reports overall improvement in ability to shop and stand with less feeling of instability. She reports compliance with HEP and feels more endurance with activity in her Feet. Her left knee ROM is slightly improved and improved more after left hip flexor stretching. Her 5 x STS has improved and she reports 25% improvement in sit-stand transfers in the AM. She has met all STGS and LTG#1. Worked on stairs and sit-stands with cues to keep weight equal through LEs. Added sit-stand to HEP as well.    PT Treatment/Interventions Aquatic Therapy;ADLs/Self Care Home  Management;Moist Heat;Stair training;Functional mobility training;Therapeutic activities;Therapeutic exercise;Balance training;Patient/family education;Manual techniques    PT Next Visit Plan check HEP, NuStep standing ther ex as tol, how was aquatics?    PT Home Exercise Plan Access Code [email or web address]    [email or web address] and Agree with Plan of Care Patient             Patient will benefit from skilled therapeutic intervention in order to improve the following deficits and impairments:  Decreased range of motion, Decreased strength, Increased fascial restricitons, Hypomobility, Improper body mechanics, Pain, Difficulty walking, Decreased mobility, Decreased endurance, Obesity  Visit Diagnosis: History of total left knee replacement  Chronic pain of left knee  Difficulty in walking, not elsewhere classified  Muscle weakness (generalized)     Problem List Patient Active Problem List   Diagnosis Date Noted   Pruritus 11/28/2020   Chronic rhinitis 11/28/2020   Rash and other nonspecific skin eruption 11/05/2020   Other adverse food reactions, not elsewhere classified, subsequent encounter 11/05/2020   Hx of food allergy 09/13/2020   Healthcare maintenance 09/13/2020   Constipation due to pain medication  03/06/2020   Cervical radiculopathy at C6 03/29/2019   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   History of pulmonary embolism 05/25/2017   Esophageal dysphagia    Family history of colon cancer    Macromastia 02/27/2015   Chronic neck and back pain 02/27/2015   Medication management 08/22/2014   Chronic anticoagulation 05/17/2014   Degenerative arthritis of left knee 10/06/2013   Status post total knee replacement 10/06/2013   Chronic pain 06/13/2013   Lap Sleeve Gastrectomy May 2014 09/16/2012   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 [place], [place], [address] Phone: [phone]   Fax:  [phone]  Name: ANNEKE CUNDY MRN: [id number] Date of Birth: 06-07-1970

## 2021-04-16 NOTE — Patient Instructions (Signed)
Access Code: [email or web address] URL: [email or web address] Date: 04/16/2021 Prepared by: Hessie Diener  Exercises Supine Bridge - 2 x daily - 7 x weekly - 2 sets - 10 reps - 5 hold Supine Active Straight Leg Raise - 2 x daily - 7 x weekly - 2 sets - 10 reps - 5 hold Sidelying Hip Abduction - 2 x daily - 7 x weekly - 2 sets - 10 reps - 5 hold Straight Leg Raise with External Rotation - 2 x daily - 7 x weekly - 2 sets - 10 reps - 5 hold Modified Thomas Stretch - 1 x daily - 7 x weekly - 1 sets - 3 reps - 30 hold

## 2021-04-22 ENCOUNTER — Ambulatory Visit: Payer: Medicare Other | Admitting: Physical Therapy

## 2021-04-22 ENCOUNTER — Telehealth: Payer: Self-pay | Admitting: Physical Therapy

## 2021-04-22 NOTE — Telephone Encounter (Signed)
Called patient to inquire about her missed appt today.  Left voicemail and reminded her the attendance policy as well as her next appt.   Raeford Razor, PT 04/22/21 1:42 PM Phone: [phone] Fax: [phone]

## 2021-04-28 ENCOUNTER — Other Ambulatory Visit: Payer: Self-pay | Admitting: Family Medicine

## 2021-04-28 DIAGNOSIS — F419 Anxiety disorder, unspecified: Secondary | ICD-10-CM

## 2021-04-30 ENCOUNTER — Ambulatory Visit: Payer: Medicare Other | Admitting: Physical Therapy

## 2021-05-01 ENCOUNTER — Other Ambulatory Visit: Payer: Self-pay | Admitting: Family Medicine

## 2021-05-01 DIAGNOSIS — F419 Anxiety disorder, unspecified: Secondary | ICD-10-CM

## 2021-05-06 ENCOUNTER — Inpatient Hospital Stay: Admit: 2021-05-06 | Payer: Medicare Other | Admitting: Surgery

## 2021-05-06 SURGERY — PANCREATECTOMY
Anesthesia: General

## 2021-05-07 ENCOUNTER — Other Ambulatory Visit: Payer: Self-pay | Admitting: Family Medicine

## 2021-05-07 DIAGNOSIS — F419 Anxiety disorder, unspecified: Secondary | ICD-10-CM

## 2021-05-08 ENCOUNTER — Other Ambulatory Visit: Payer: Self-pay | Admitting: Family Medicine

## 2021-05-14 ENCOUNTER — Encounter: Payer: Self-pay | Admitting: Physical Therapy

## 2021-05-21 ENCOUNTER — Encounter: Payer: Self-pay | Admitting: Physical Therapy

## 2021-05-21 ENCOUNTER — Ambulatory Visit: Payer: BLUE CROSS/BLUE SHIELD | Attending: Physician Assistant | Admitting: Physical Therapy

## 2021-05-21 ENCOUNTER — Ambulatory Visit: Payer: Medicare Other | Admitting: Orthopaedic Surgery

## 2021-05-21 ENCOUNTER — Other Ambulatory Visit: Payer: Self-pay

## 2021-05-21 DIAGNOSIS — Z96652 Presence of left artificial knee joint: Secondary | ICD-10-CM | POA: Diagnosis not present

## 2021-05-21 DIAGNOSIS — M25562 Pain in left knee: Secondary | ICD-10-CM | POA: Diagnosis not present

## 2021-05-21 DIAGNOSIS — G8929 Other chronic pain: Secondary | ICD-10-CM | POA: Diagnosis not present

## 2021-05-21 DIAGNOSIS — R262 Difficulty in walking, not elsewhere classified: Secondary | ICD-10-CM | POA: Insufficient documentation

## 2021-05-21 DIAGNOSIS — M6281 Muscle weakness (generalized): Secondary | ICD-10-CM | POA: Insufficient documentation

## 2021-05-21 NOTE — Progress Notes (Signed)
Sent message, via epic in basket, requesting orders in epic from surgeon.  

## 2021-05-21 NOTE — Therapy (Signed)
[facility] [place], [place], [address] Phone: [phone]   Fax:  [phone]  Physical Therapy Treatment  Patient Details  Name: Crystal Garza MRN: [id number] Date of Birth: [birth year] Referring Provider (PT): Erskine Emery, [place]   Encounter Date: 05/21/2021   PT End of Session - 05/21/21 1200     Visit Number 5    Number of Visits 8    Date for PT Re-Evaluation 05/21/21    Authorization Type UHC MCR, MCD    PT Start Time 0800    PT Stop Time 0845    PT Time Calculation (min) 45 min    Activity Tolerance Patient tolerated treatment well    Behavior During Therapy [facility] for tasks assessed/performed             Past Medical History:  Diagnosis Date   Anemia    iron def   Anginal pain ([place])    Anxiety    Arthritis    left knee    Back pain    Benign gastrointestinal stromal tumor (GIST)    Chronic knee pain    Depression    DVT (deep venous thrombosis) (HCC)    Esophageal dysmotility    GERD (gastroesophageal reflux disease)    Hiatal hernia    History of kidney stones    Hx of laparoscopic gastric banding    Hypertension    Internal hemorrhoids    Internal hemorrhoids    Migraine    Morbid obesity ([place])    PE (pulmonary embolism)     Past Surgical History:  Procedure Laterality Date   9 HOUR [place] STUDY N/A 05/13/2016   Procedure: 24 HOUR [place] STUDY;  Surgeon: Manus Gunning, MD;  [facility];  Service: Gastroenterology;  Laterality: N/A;   BIOPSY  02/16/2012   Procedure: BIOPSY;  Surgeon: [name], MD;  [facility];  Service: General;;  biopsy of mass x 2   BREATH TEK H PYLORI  07/28/2011   Procedure: BREATH TEK H PYLORI;  Surgeon: [name], MD;  [facility];  Service: General;  Laterality: N/A;  to be done at [facility]  05/1991   COLONOSCOPY WITH PROPOFOL N/A 03/03/2016   Procedure: COLONOSCOPY WITH PROPOFOL;  Surgeon: Manus Gunning, MD;  [facility];  Service: Gastroenterology;  Laterality: N/A;   ESOPHAGEAL MANOMETRY N/A 05/13/2016   Procedure: ESOPHAGEAL MANOMETRY (EM);  Surgeon: Manus Gunning, MD;  [facility];  Service: Gastroenterology;  Laterality: N/A;   ESOPHAGOGASTRODUODENOSCOPY N/A 08/29/2012   Procedure: ESOPHAGOGASTRODUODENOSCOPY (EGD);  Surgeon: [name], MD;  [facility];  Service: General;  Laterality: N/A;   ESOPHAGOGASTRODUODENOSCOPY N/A 09/04/2014   Procedure: ESOPHAGOGASTRODUODENOSCOPY (EGD);  Surgeon: Alphonsa Overall, MD;  [facility];  Service: General;  Laterality: N/A;   ESOPHAGOGASTRODUODENOSCOPY (EGD) WITH PROPOFOL N/A 03/03/2016   Procedure: ESOPHAGOGASTRODUODENOSCOPY (EGD) WITH PROPOFOL;  Surgeon: Manus Gunning, MD;  [facility];  Service: Gastroenterology;  Laterality: N/A;   EUS  03/03/2012   Procedure: UPPER ENDOSCOPIC ULTRASOUND (EUS) LINEAR;  Surgeon: Milus Banister, MD;  [facility];  Service: Endoscopy;  Laterality: N/A;   GASTRIC ROUX-EN-Y N/A 12/07/2017   Procedure: LAPAROSCOPIC ROUX-EN-Y GASTRIC BYPASS WITH LYSIS OF ADHESIONS AND UPPER ENDOSCOPY ERAS Pathway;  Surgeon: Johnathan Hausen, MD;  [facility];  Service: General;  Laterality: N/A;   gist     KNEE ARTHROSCOPY     LAPAROSCOPIC  GASTRECTOMY  04/08/2012   Procedure: LAPAROSCOPIC GASTRECTOMY;  Surgeon: [name], MD;  [facility];  Service: General;;  removal of GIST tumor of stomach   LAPAROSCOPIC GASTRIC SLEEVE RESECTION N/A 08/29/2012   Procedure: LAPAROSCOPIC GASTRIC SLEEVE RESECTION;  Surgeon: [name], MD;  [facility];  Service: General;  Laterality: N/A;  Sleeve Gastrectomy   LEFT HEART CATH AND CORONARY ANGIOGRAPHY N/A 05/28/2017   Procedure: LEFT HEART CATH AND CORONARY ANGIOGRAPHY;  Surgeon: Leonie Man, MD;  Location: [facility];  Service: Cardiovascular;  Laterality: N/A;    ROUX-EN-Y GASTRIC BYPASS     TOTAL ABDOMINAL HYSTERECTOMY  04/2004   TOTAL KNEE ARTHROPLASTY Left 10/06/2013   Procedure: LEFT TOTAL KNEE ARTHROPLASTY;  Surgeon: Mcarthur Rossetti, MD;  [facility];  Service: Orthopedics;  Laterality: Left;   TUBAL LIGATION      There were no vitals filed for this visit.   Subjective Assessment - 05/21/21 0804     Subjective Pt has been out of town, returns for PT in order to finish POC .  She is having surgery in a couple weeks.    Currently in Pain? Yes   min bilateral knees   Pain Location Knee    Pain Orientation Right;Left                [facility] PT Assessment - 05/21/21 0001       AROM   Left Knee Flexion 95      Strength   Right Hip ABduction 4/5    Left Hip ABduction 4/5      Transfers   Five time sit to stand comments  16                   [facility] Adult PT Treatment/Exercise:   Therapeutic Exercise: Nustep L6 UE and LE for 8 min SLR x 10 each  Bridging  x 10  Wall slides x 15  Heel raises x 15  Sit to stand x 10  Step ups lateral, 4 inch     Self Care: ROM, progress, goals and DC     PT Education - 05/21/21 1203     Education Details DC, HEP    Person(s) Educated Patient    Methods Explanation    Comprehension Verbalized understanding              PT Short Term Goals - 05/21/21 0822       PT SHORT TERM GOAL #1   Title Pt will understand FOTO results and potential to improve her condition.    Status Achieved      PT SHORT TERM GOAL #2   Title Pt will be I with basic HEP for L knee and hips    Status Achieved      PT SHORT TERM GOAL #3   Title Pt will report stiffness , pain in knee 20-25% better with sit to stand, AM hours    Status Achieved               PT Long Term Goals - 05/21/21 6226       PT LONG TERM GOAL #1   Title Pt will be able to walk in the grocery store without feelings of instability    Status Achieved      PT LONG TERM GOAL #2   Title Pt will be able  to walk up and down the stairs with no increased pain in L knee using  1 rail.    Baseline has increased pain with descent, back and side door. less issues going up stairs    Status Partially Met      PT LONG TERM GOAL #3   Title Pt will be able to show proper squat without increased knee pain for transfers    Baseline min pain with this, depending on depth    Status Partially Met      PT LONG TERM GOAL #4   Title Pt will be able to bend her L knee to 100 deg or more to promote proper squat mechanics.    Baseline 95 deg    Status Partially Met      PT LONG TERM GOAL #5   Title FOTO score will improve to 60 % or more to demo improved functional mobility    Baseline 56%    Status Not Met                   Plan - 05/21/21 0820     Clinical Impression Statement Patient was discharged today.  She continues to have min knee pain but does cont to do her exercises.  She still has pain with descending stairs.  She will be having surgery in the coming weeks and so she chose to finish up therapy.    PT Treatment/Interventions Aquatic Therapy;ADLs/Self Care Home Management;Moist Heat;Stair training;Functional mobility training;Therapeutic activities;Therapeutic exercise;Balance training;Patient/family education;Manual techniques    PT Next Visit Plan NA    PT Home Exercise Plan Access Code [email or web address]    Consulted and Agree with Plan of Care Patient             Patient will benefit from skilled therapeutic intervention in order to improve the following deficits and impairments:  Decreased range of motion, Decreased strength, Increased fascial restricitons, Hypomobility, Improper body mechanics, Pain, Difficulty walking, Decreased mobility, Decreased endurance, Obesity  Visit Diagnosis: History of total left knee replacement  Chronic pain of left knee  Difficulty in walking, not elsewhere classified  Muscle weakness (generalized)     Problem List Patient Active Problem List    Diagnosis Date Noted   Pruritus 11/28/2020   Chronic rhinitis 11/28/2020   Rash and other nonspecific skin eruption 11/05/2020   Other adverse food reactions, not elsewhere classified, subsequent encounter 11/05/2020   Hx of food allergy 09/13/2020   Healthcare maintenance 09/13/2020   Constipation due to pain medication 03/06/2020   Cervical radiculopathy at C6 03/29/2019   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   History of pulmonary embolism 05/25/2017   Esophageal dysphagia    Family history of colon cancer    Macromastia 02/27/2015   Chronic neck and back pain 02/27/2015   Medication management 08/22/2014   Chronic anticoagulation 05/17/2014   Degenerative arthritis of left knee 10/06/2013   Status post total knee replacement 10/06/2013   Chronic pain 06/13/2013   Lap Sleeve Gastrectomy May 2014 09/16/2012   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 [place], [place], [address] Phone: [phone]   Fax:  [phone]  Name: Crystal Garza MRN: [id number] Date of Birth:  04-Mar-1971  PHYSICAL THERAPY DISCHARGE SUMMARY  Visits from Start of Care: 5  Current functional level related to goals / functional outcomes: See above    Remaining deficits: Flexion Lt knee , core and hip strength   Education / Equipment: HEP, activity , RICE    Patient agrees to discharge. Patient goals upcoimwere partially met. Patient is being discharged due to upcoming surgery.      Raeford Razor, PT 05/21/21 12:04 PM Phone:  [phone] Fax: [phone]

## 2021-05-23 NOTE — Progress Notes (Deleted)
Office Visit Note  Patient: Crystal Garza             Date of Birth: April 18, 1971           MRN: [id number]             PCP: Dickie La, MD Referring: Dickie La, MD Visit Date: 06/05/2021 Occupation: _0 @  Subjective:  No chief complaint on file.   History of Present Illness: Crystal Garza is a 51 y.o. female ***   Activities of Daily Living:  Patient reports morning stiffness for *** {minute/hour:19697}.   Patient {ACTIONS;DENIES/REPORTS:21021675::"Denies"} nocturnal pain.  Difficulty dressing/grooming: {ACTIONS;DENIES/REPORTS:21021675::"Denies"} Difficulty climbing stairs: {ACTIONS;DENIES/REPORTS:21021675::"Denies"} Difficulty getting out of chair: {ACTIONS;DENIES/REPORTS:21021675::"Denies"} Difficulty using hands for taps, buttons, cutlery, and/or writing: {ACTIONS;DENIES/REPORTS:21021675::"Denies"}  No Rheumatology ROS completed.   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   History of pulmonary embolism 05/25/2017   Esophageal dysphagia    Family history of colon cancer    Macromastia 02/27/2015   Chronic neck and back pain 02/27/2015   Medication management 08/22/2014   Chronic anticoagulation 05/17/2014   Degenerative arthritis of left knee 10/06/2013   Status post total knee replacement 10/06/2013   Chronic pain 06/13/2013   Lap Sleeve Gastrectomy May 2014 09/16/2012   Gastrointestinal stromal tumor (GIST) of stomach ([place]) 02/17/2012   Nephrolithiasis 02/20/2011   Iron deficiency anemia 06/27/2010   Anxiety  06/27/2010   Migraine 09/12/2009   Morbid obesity-BMI 62 10/30/2008   Major depressive disorder, recurrent episode ([place]) 07/01/2006   HYPERTENSION, BENIGN SYSTEMIC 07/01/2006   Gastroesophageal reflux disease 07/01/2006    Past Medical History:  Diagnosis Date   Anemia    iron def   Anginal pain ([place])    Anxiety    Arthritis    left knee    Back pain    Benign gastrointestinal stromal tumor (GIST)    Chronic knee pain    Depression    DVT (deep venous thrombosis) (HCC)    Esophageal dysmotility    GERD (gastroesophageal reflux disease)    Hiatal hernia    History of kidney stones    Hx of laparoscopic gastric banding    Hypertension    Internal hemorrhoids    Internal hemorrhoids    Migraine    Morbid obesity ([place])    PE (pulmonary embolism)     Family History  Problem Relation Age of Onset   Diabetes Mother    Diabetes Father    Heart disease Father    Colon cancer Father        brain tumor   Colon polyps Father    Colitis Father    Irritable bowel syndrome Father    Diabetes Maternal Grandmother    Diabetes Maternal Grandfather    Schizophrenia Maternal Grandfather    Heart disease Paternal Uncle    Diabetes Paternal Uncle    Heart disease Paternal Grandmother    Heart disease Paternal Grandfather    Diabetes Maternal Aunt    Diabetes Maternal Uncle    Diabetes Paternal Aunt    Cancer  Other    Hypertension Other    Esophageal cancer Neg Hx    Past Surgical History:  Procedure Laterality Date   46 HOUR [place] STUDY N/A 05/13/2016   Procedure: 24 HOUR PH STUDY;  Surgeon: Manus Gunning, MD;  [facility];  Service: Gastroenterology;  Laterality: N/A;   BIOPSY  02/16/2012   Procedure: BIOPSY;  Surgeon: [name], MD;  [facility];  Service: General;;  biopsy of mass x 2   BREATH TEK H PYLORI  07/28/2011   Procedure: BREATH TEK H PYLORI;  Surgeon: [name], MD;  [facility];  Service: General;  Laterality: N/A;   to be done at [facility]  05/1991   COLONOSCOPY WITH PROPOFOL N/A 03/03/2016   Procedure: COLONOSCOPY WITH PROPOFOL;  Surgeon: Manus Gunning, MD;  [facility];  Service: Gastroenterology;  Laterality: N/A;   ESOPHAGEAL MANOMETRY N/A 05/13/2016   Procedure: ESOPHAGEAL MANOMETRY (EM);  Surgeon: Manus Gunning, MD;  [facility];  Service: Gastroenterology;  Laterality: N/A;   ESOPHAGOGASTRODUODENOSCOPY N/A 08/29/2012   Procedure: ESOPHAGOGASTRODUODENOSCOPY (EGD);  Surgeon: [name], MD;  [facility];  Service: General;  Laterality: N/A;   ESOPHAGOGASTRODUODENOSCOPY N/A 09/04/2014   Procedure: ESOPHAGOGASTRODUODENOSCOPY (EGD);  Surgeon: Alphonsa Overall, MD;  Location: [facility];  Service: General;  Laterality: N/A;   ESOPHAGOGASTRODUODENOSCOPY (EGD) WITH PROPOFOL N/A 03/03/2016   Procedure: ESOPHAGOGASTRODUODENOSCOPY (EGD) WITH PROPOFOL;  Surgeon: Manus Gunning, MD;  [facility];  Service: Gastroenterology;  Laterality: N/A;   EUS  03/03/2012   Procedure: UPPER ENDOSCOPIC ULTRASOUND (EUS) LINEAR;  Surgeon: Milus Banister, MD;  [facility];  Service: Endoscopy;  Laterality: N/A;   GASTRIC ROUX-EN-Y N/A 12/07/2017   Procedure: LAPAROSCOPIC ROUX-EN-Y GASTRIC BYPASS WITH LYSIS OF ADHESIONS AND UPPER ENDOSCOPY ERAS Pathway;  Surgeon: Johnathan Hausen, MD;  [facility];  Service: General;  Laterality: N/A;   gist     KNEE ARTHROSCOPY     LAPAROSCOPIC GASTRECTOMY  04/08/2012   Procedure: LAPAROSCOPIC GASTRECTOMY;  Surgeon: [name], MD;  [facility];  Service: General;;  removal of GIST tumor of stomach   LAPAROSCOPIC GASTRIC SLEEVE RESECTION N/A 08/29/2012   Procedure: LAPAROSCOPIC GASTRIC SLEEVE RESECTION;  Surgeon: [name], MD;  [facility];  Service: General;  Laterality: N/A;  Sleeve Gastrectomy   LEFT HEART CATH AND CORONARY ANGIOGRAPHY N/A 05/28/2017   Procedure: LEFT  HEART CATH AND CORONARY ANGIOGRAPHY;  Surgeon: Leonie Man, MD;  Location: [facility];  Service: Cardiovascular;  Laterality: N/A;   ROUX-EN-Y GASTRIC BYPASS     TOTAL ABDOMINAL HYSTERECTOMY  04/2004   TOTAL KNEE ARTHROPLASTY Left 10/06/2013   Procedure: LEFT TOTAL KNEE ARTHROPLASTY;  Surgeon: Mcarthur Rossetti, MD;  [facility];  Service: Orthopedics;  Laterality: Left;   TUBAL LIGATION     Social History   Social History Narrative   Patient has been on disability since 2004 for knee pain, back pain, depression and acid reflux. She does work part-time now as Quarry manager.      She is caring for her granddaughter Amia age 60 (has sickle cell, goes to [facility] every month and has had multiple hospitalizations).       Patient enjoys going on walks, going to the beach, and cooking.    Immunization History  Administered Date(s) Administered   Influenza Split 06/01/2011, 02/17/2012, 01/17/2013   Influenza Whole 03/18/2009   Influenza,inj,Quad PF,6+ Mos 01/03/2014,  02/06/2015, 02/19/2016, 02/24/2017, 05/05/2018, 03/19/2021   Influenza-Unspecified 09/22/2017, 12/03/2017   PFIZER Comirnaty(Gray Top)Covid-19 Tri-Sucrose Vaccine 09/11/2020   PPD Test 09/22/2010, 09/11/2011, 10/17/2012, 11/06/2013, 10/31/2014, 10/07/2015, 11/12/2017, 03/21/2020, 03/19/2021     Objective: Vital Signs: There were no vitals taken for this visit.   Physical Exam   Musculoskeletal Exam: ***  CDAI Exam: CDAI Score: -- Patient Global: --; Provider Global: -- Swollen: --; Tender: -- Joint Exam 06/05/2021   No joint exam has been documented for this visit   There is currently no information documented on the homunculus. Go to the Rheumatology activity and complete the homunculus joint exam.  Investigation: No additional findings.  Imaging: No results found.  Recent Labs: Lab Results  Component Value Date   WBC 9.4 04/04/2021   HGB 12.2 04/04/2021   PLT 492 (H) 04/04/2021   NA 141  04/04/2021   K 3.8 04/04/2021   CL 103 04/04/2021   CO2 21 04/04/2021   GLUCOSE 77 04/04/2021   BUN 7 04/04/2021   CREATININE 0.67 04/04/2021   BILITOT 0.5 04/04/2021   ALKPHOS 86 04/04/2021   AST 16 04/04/2021   ALT 8 04/04/2021   PROT 6.9 04/04/2021   ALBUMIN 3.8 04/04/2021   CALCIUM 9.3 04/04/2021   GFRAA >60 02/04/2018    Speciality Comments: No specialty comments available.  Procedures:  No procedures performed Allergies: Codeine and Tuna [fish allergy]   Assessment / Plan:     Visit Diagnoses: Polyarthralgia  Elevated sed rate - 11/13/20: ESR 55, ANA negative. 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  No orders of the defined types were placed in this encounter.   Face-to-face time spent with patient was *** minutes. Greater than 50% of time was spent in counseling and coordination of care.  Follow-Up Instructions: No follow-ups on file.   Ofilia Neas, PA-C  Note - This record has been created using Dragon software.  Chart creation errors have been sought, but may not always  have been located. Such creation errors do not reflect on  the standard of medical care.

## 2021-05-27 ENCOUNTER — Other Ambulatory Visit: Payer: Self-pay | Admitting: Family Medicine

## 2021-05-28 ENCOUNTER — Other Ambulatory Visit: Payer: Self-pay | Admitting: Family Medicine

## 2021-05-28 NOTE — Patient Instructions (Addendum)
DUE TO COVID-19 ONLY ONE VISITOR IS ALLOWED TO COME WITH YOU AND STAY IN THE WAITING ROOM ONLY DURING PRE OP AND PROCEDURE.   **NO VISITORS ARE ALLOWED IN THE SHORT STAY AREA OR RECOVERY ROOM!!**  IF YOU WILL BE ADMITTED INTO THE HOSPITAL YOU ARE ALLOWED ONLY TWO SUPPORT PEOPLE DURING VISITATION HOURS ONLY (7 AM -8PM)   The support person(s) must pass our screening, gel in and out, and wear a mask at all times, including in the patients room. Patients must also wear a mask when staff or their support person are in the room. Visitors GUEST BADGE MUST BE WORN VISIBLY  One adult visitor may remain with you overnight and MUST be in the room by 8 P.M.  No visitors under the age of 68. Any visitor under the age of 62 must be accompanied by an adult.    COVID SWAB TESTING MUST BE COMPLETED ON:  06/05/21 @ 8:45 AM   Site: [facility] [place] [place] Enter: Main Entrance have a seat in the waiting area to the right of main entrance (DO NOT [place]!!!!!) Dial: [phone] to alert staff you have arrived  You are not required to quarantine, however you are required to wear a well-fitted mask when you are out and around people not in your household.  Hand Hygiene often Do NOT share personal items Notify your provider if you are in close contact with someone who has COVID or you develop fever 100.4 or greater, new onset of sneezing, cough, sore throat, shortness of breath or body aches.       Your procedure is scheduled on: 06/09/21   Report to [facility] Main Entrance    Report to admitting at 11:15 AM   Call this number if you have problems the morning of surgery [phone]   Do not eat food :After Midnight.   May have liquids until 10:30 AM day of surgery  CLEAR LIQUID DIET  Foods Allowed                                                                     Foods Excluded  Water, Black Coffee and tea (no milk or creamer)           liquids  that you cannot  Plain Jell-O in any flavor  (No red)                                    see through such as: Fruit ices (not with fruit pulp)                                            milk, soups, orange juice              Iced Popsicles (No red)  All solid food                                   Apple juices Sports drinks like Gatorade (No red) Lightly seasoned clear broth or consume(fat free) Sugar   Oral Hygiene is also important to reduce your risk of infection.                                    Remember - BRUSH YOUR TEETH THE MORNING OF SURGERY WITH YOUR REGULAR TOOTHPASTE   Call your doctors office and ask for instructions regarding Xarelto   Take these medicines the morning of surgery with A SIP OF WATER: Xanax, Dexilant, Cymbalta, Pepcid, Zofran, Percocet   DO NOT TAKE ANY ORAL DIABETIC MEDICATIONS DAY OF YOUR SURGERY                              You may not have any metal on your body including hair pins, jewelry, and body piercing             Do not wear make-up, lotions, powders, perfumese, or deodorant  Do not wear nail polish including gel and S&S, artificial/acrylic nails, or any other type of covering on natural nails including finger and toenails. If you have artificial nails, gel coating, etc. that needs to be removed by a nail salon please have this removed prior to surgery or surgery may need to be canceled/ delayed if the surgeon/ anesthesia feels like they are unable to be safely monitored.   Do not shave  48 hours prior to surgery.    Do not bring valuables to the hospital. [place].   Contacts, dentures or bridgework may not be worn into surgery.   Bring small overnight bag day of surgery.              Please read over the following fact sheets you were given: IF YOU HAVE QUESTIONS ABOUT YOUR PRE-OP INSTRUCTIONS PLEASE CALL [facility] [place] -  Preparing for Surgery Before surgery, you can play an important role.  Because skin is not sterile, your skin needs to be as free of germs as possible.  You can reduce the number of germs on your skin by washing with CHG (chlorahexidine gluconate) soap before surgery.  CHG is an antiseptic cleaner which kills germs and bonds with the skin to continue killing germs even after washing. Please DO NOT use if you have an allergy to CHG or antibacterial soaps.  If your skin becomes reddened/irritated stop using the CHG and inform your nurse when you arrive at Short Stay. Do not shave (including legs and underarms) for at least 48 hours prior to the first CHG shower.  You may shave your face/neck.  Please follow these instructions carefully:  1.  Shower with CHG Soap the night before surgery and the  morning of surgery.  2.  If you choose to wash your hair, wash your hair first as usual with your normal  shampoo.  3.  After you shampoo, rinse your hair and body thoroughly to remove the shampoo.  4.  Use CHG as you would any other liquid soap.  You can apply chg directly to the skin and wash.  Gently with a scrungie or clean washcloth.  5.  Apply the CHG Soap to your body ONLY FROM THE NECK DOWN.   Do   not use on face/ open                           Wound or open sores. Avoid contact with eyes, ears mouth and   genitals (private parts).                       Wash face,  Genitals (private parts) with your normal soap.             6.  Wash thoroughly, paying special attention to the area where your    surgery  will be performed.  7.  Thoroughly rinse your body with warm water from the neck down.  8.  DO NOT shower/wash with your normal soap after using and rinsing off the CHG Soap.                9.  Pat yourself dry with a clean towel.            10.  Wear clean pajamas.            11.  Place clean sheets on your bed the night of your first shower and do not  sleep with  pets. Day of Surgery : Do not apply any lotions/deodorants the morning of surgery.  Please wear clean clothes to the hospital/surgery center.  FAILURE TO FOLLOW THESE INSTRUCTIONS MAY RESULT IN THE CANCELLATION OF YOUR SURGERY  PATIENT SIGNATURE_________________________________  NURSE SIGNATURE__________________________________  ________________________________________________________________________

## 2021-05-28 NOTE — Progress Notes (Addendum)
COVID swab appointment: 06/05/21 @ 0845  COVID Vaccine Completed: yes x3 Date COVID Vaccine completed: 09/11/20 Has received booster: COVID vaccine manufacturer: [employer]   Date of COVID positive in last 90 days: no  PCP - [name], MD Cardiologist - n/a  Chest x-ray - n/a EKG - 05/29/21 Epic/chart Stress Test - n/a ECHO - n/a Cardiac Cath - 05/28/17 Epic Pacemaker/ICD device last checked: n/a Spinal Cord Stimulator: n/a  Sleep Study - n/a CPAP -   Fasting Blood Sugar - n/a Checks Blood Sugar _____ times a day  Blood Thinner Instructions: Xarelto, hold 2 days Aspirin Instructions: Last Dose:  Activity level: Can go up a flight of stairs and perform activities of daily living without stopping and without symptoms of chest pain or shortness of breath.  Anesthesia review: Htn, esophageal dysphagia, DVT, PE, anemia  Patient denies shortness of breath, fever, cough and chest pain at PAT appointment   Patient verbalized understanding of instructions that were given to them at the PAT appointment. Patient was also instructed that they will need to review over the PAT instructions again at home before surgery.

## 2021-05-28 NOTE — Progress Notes (Signed)
Please place orders for PAT appointment scheduled 04/08/22. 

## 2021-05-29 ENCOUNTER — Other Ambulatory Visit: Payer: Self-pay

## 2021-05-29 ENCOUNTER — Encounter (HOSPITAL_COMMUNITY)
Admission: RE | Admit: 2021-05-29 | Discharge: 2021-05-29 | Disposition: A | Discharge status: Home / Self Care | Payer: BLUE CROSS/BLUE SHIELD | Source: Ambulatory Visit | Attending: Surgery | Admitting: Surgery

## 2021-05-29 ENCOUNTER — Encounter (HOSPITAL_COMMUNITY): Payer: Self-pay

## 2021-05-29 VITALS — BP 149/74 | HR 72 | Temp 97.8°F | Resp 18 | Ht 67.0 in | Wt 280.4 lb

## 2021-05-29 DIAGNOSIS — I10 Essential (primary) hypertension: Secondary | ICD-10-CM | POA: Insufficient documentation

## 2021-05-29 DIAGNOSIS — Z01818 Encounter for other preprocedural examination: Secondary | ICD-10-CM | POA: Insufficient documentation

## 2021-05-29 LAB — BASIC METABOLIC PANEL
Anion gap: 5 (ref 5–15)
BUN: 15 mg/dL (ref 6–20)
CO2: 26 mmol/L (ref 22–32)
Calcium: 8.9 mg/dL (ref 8.9–10.3)
Chloride: 107 mmol/L (ref 98–111)
Creatinine, Ser: 0.84 mg/dL (ref 0.44–1.00)
GFR, Estimated: >60 mL/min (ref >60)
Glucose, Bld: 76 mg/dL (ref 70–99)
Potassium: 3.5 mmol/L (ref 3.5–5.1)
Sodium: 138 mmol/L (ref 135–145)

## 2021-05-29 LAB — CBC
HCT: 40.2 % (ref 36.0–46.0)
Hemoglobin: 12.3 g/dL (ref 12.0–15.0)
MCH: 26.7 pg (ref 26.0–34.0)
MCHC: 30.6 g/dL (ref 30.0–36.0)
MCV: 87.2 fL (ref 80.0–100.0)
Platelets: 371 10*3/uL (ref 150–400)
RBC: 4.61 MIL/uL (ref 3.87–5.11)
RDW: 14.6 % (ref 11.5–15.5)
WBC: 8.9 10*3/uL (ref 4.0–10.5)
nRBC: 0 % (ref 0.0–0.2)

## 2021-06-03 ENCOUNTER — Telehealth: Payer: Self-pay | Admitting: Family Medicine

## 2021-06-03 NOTE — Telephone Encounter (Signed)
[facility] is calling to check on the status of form being completed and faxed back. We had them resend form yesterday 06/02/21. We have received and placed in Dr. Kara Mead box. Patients surgery is Monday 02/06 and they need to know how long she needs to hold her medication.   Please advise.

## 2021-06-05 ENCOUNTER — Encounter (HOSPITAL_COMMUNITY)
Admission: RE | Admit: 2021-06-05 | Discharge: 2021-06-05 | Disposition: A | Discharge status: Home / Self Care | Payer: BLUE CROSS/BLUE SHIELD | Source: Ambulatory Visit | Attending: Surgery | Admitting: Surgery

## 2021-06-05 ENCOUNTER — Ambulatory Visit: Payer: Medicare Other | Admitting: Rheumatology

## 2021-06-05 ENCOUNTER — Other Ambulatory Visit: Payer: Self-pay

## 2021-06-05 DIAGNOSIS — N200 Calculus of kidney: Secondary | ICD-10-CM

## 2021-06-05 DIAGNOSIS — Z20822 Contact with and (suspected) exposure to covid-19: Secondary | ICD-10-CM | POA: Insufficient documentation

## 2021-06-05 DIAGNOSIS — I10 Essential (primary) hypertension: Secondary | ICD-10-CM

## 2021-06-05 DIAGNOSIS — Z01818 Encounter for other preprocedural examination: Secondary | ICD-10-CM

## 2021-06-05 DIAGNOSIS — Z9884 Bariatric surgery status: Secondary | ICD-10-CM

## 2021-06-05 DIAGNOSIS — Z7901 Long term (current) use of anticoagulants: Secondary | ICD-10-CM

## 2021-06-05 DIAGNOSIS — C49A2 Gastrointestinal stromal tumor of stomach: Secondary | ICD-10-CM

## 2021-06-05 DIAGNOSIS — F32A Depression, unspecified: Secondary | ICD-10-CM

## 2021-06-05 DIAGNOSIS — Z8669 Personal history of other diseases of the nervous system and sense organs: Secondary | ICD-10-CM

## 2021-06-05 DIAGNOSIS — K5903 Drug induced constipation: Secondary | ICD-10-CM

## 2021-06-05 DIAGNOSIS — Z86711 Personal history of pulmonary embolism: Secondary | ICD-10-CM

## 2021-06-05 DIAGNOSIS — R700 Elevated erythrocyte sedimentation rate: Secondary | ICD-10-CM

## 2021-06-05 DIAGNOSIS — M1712 Unilateral primary osteoarthritis, left knee: Secondary | ICD-10-CM

## 2021-06-05 DIAGNOSIS — Z01812 Encounter for preprocedural laboratory examination: Secondary | ICD-10-CM | POA: Insufficient documentation

## 2021-06-05 DIAGNOSIS — M2550 Pain in unspecified joint: Secondary | ICD-10-CM

## 2021-06-05 DIAGNOSIS — Z96652 Presence of left artificial knee joint: Secondary | ICD-10-CM

## 2021-06-05 DIAGNOSIS — R1319 Other dysphagia: Secondary | ICD-10-CM

## 2021-06-05 DIAGNOSIS — K219 Gastro-esophageal reflux disease without esophagitis: Secondary | ICD-10-CM

## 2021-06-05 LAB — SARS CORONAVIRUS 2 (TAT 6-24 HRS): SARS Coronavirus 2: NEGATIVE

## 2021-06-06 NOTE — Telephone Encounter (Signed)
It has been faxed, AGAIN!!. Jerene Pitch found it in the already faxed file and we resent it agin just now.

## 2021-06-06 NOTE — Progress Notes (Signed)
Spoke with Abigail Butts at [facility] requesting orders.

## 2021-06-09 ENCOUNTER — Inpatient Hospital Stay (HOSPITAL_COMMUNITY): Payer: BLUE CROSS/BLUE SHIELD | Admitting: Physician Assistant

## 2021-06-09 ENCOUNTER — Inpatient Hospital Stay (HOSPITAL_COMMUNITY)
Admission: RE | Admit: 2021-06-09 | Discharge: 2021-06-12 | DRG: 572 | Disposition: A | Discharge status: Home / Self Care | Payer: BLUE CROSS/BLUE SHIELD | Attending: Surgery | Admitting: Surgery

## 2021-06-09 ENCOUNTER — Other Ambulatory Visit: Payer: Self-pay

## 2021-06-09 ENCOUNTER — Encounter (HOSPITAL_COMMUNITY)
Admission: RE | Disposition: A | Discharge status: Home / Self Care | Payer: Self-pay | Source: Home / Self Care | Attending: Surgery

## 2021-06-09 ENCOUNTER — Inpatient Hospital Stay (HOSPITAL_COMMUNITY): Payer: BLUE CROSS/BLUE SHIELD | Admitting: Certified Registered"

## 2021-06-09 ENCOUNTER — Encounter (HOSPITAL_COMMUNITY): Payer: Self-pay | Admitting: Surgery

## 2021-06-09 DIAGNOSIS — E65 Localized adiposity: Secondary | ICD-10-CM | POA: Diagnosis present

## 2021-06-09 DIAGNOSIS — Z86711 Personal history of pulmonary embolism: Secondary | ICD-10-CM

## 2021-06-09 DIAGNOSIS — I10 Essential (primary) hypertension: Secondary | ICD-10-CM | POA: Diagnosis present

## 2021-06-09 DIAGNOSIS — Z833 Family history of diabetes mellitus: Secondary | ICD-10-CM | POA: Diagnosis not present

## 2021-06-09 DIAGNOSIS — M5412 Radiculopathy, cervical region: Secondary | ICD-10-CM | POA: Diagnosis not present

## 2021-06-09 DIAGNOSIS — Z91013 Allergy to seafood: Secondary | ICD-10-CM | POA: Diagnosis not present

## 2021-06-09 DIAGNOSIS — D649 Anemia, unspecified: Secondary | ICD-10-CM | POA: Diagnosis not present

## 2021-06-09 DIAGNOSIS — Z9851 Tubal ligation status: Secondary | ICD-10-CM

## 2021-06-09 DIAGNOSIS — Z9884 Bariatric surgery status: Secondary | ICD-10-CM

## 2021-06-09 DIAGNOSIS — K219 Gastro-esophageal reflux disease without esophagitis: Secondary | ICD-10-CM | POA: Diagnosis present

## 2021-06-09 DIAGNOSIS — Z7901 Long term (current) use of anticoagulants: Secondary | ICD-10-CM

## 2021-06-09 DIAGNOSIS — M793 Panniculitis, unspecified: Secondary | ICD-10-CM | POA: Diagnosis not present

## 2021-06-09 DIAGNOSIS — Z87442 Personal history of urinary calculi: Secondary | ICD-10-CM | POA: Diagnosis not present

## 2021-06-09 DIAGNOSIS — Z85831 Personal history of malignant neoplasm of soft tissue: Secondary | ICD-10-CM | POA: Diagnosis not present

## 2021-06-09 DIAGNOSIS — Z01818 Encounter for other preprocedural examination: Secondary | ICD-10-CM

## 2021-06-09 DIAGNOSIS — Z885 Allergy status to narcotic agent status: Secondary | ICD-10-CM

## 2021-06-09 DIAGNOSIS — Z9889 Other specified postprocedural states: Secondary | ICD-10-CM

## 2021-06-09 DIAGNOSIS — Z96652 Presence of left artificial knee joint: Secondary | ICD-10-CM | POA: Diagnosis not present

## 2021-06-09 DIAGNOSIS — R21 Rash and other nonspecific skin eruption: Secondary | ICD-10-CM

## 2021-06-09 DIAGNOSIS — K08409 Partial loss of teeth, unspecified cause, unspecified class: Secondary | ICD-10-CM | POA: Diagnosis present

## 2021-06-09 HISTORY — PX: PANNICULECTOMY: SHX5360

## 2021-06-09 LAB — CREATININE, SERUM
Creatinine, Ser: 0.8 mg/dL (ref 0.44–1.00)
GFR, Estimated: >60 mL/min (ref >60)

## 2021-06-09 LAB — CBC
HCT: 43.6 % (ref 36.0–46.0)
Hemoglobin: 13.3 g/dL (ref 12.0–15.0)
MCH: 26.8 pg (ref 26.0–34.0)
MCHC: 30.5 g/dL (ref 30.0–36.0)
MCV: 87.9 fL (ref 80.0–100.0)
Platelets: 437 10*3/uL — ABNORMAL HIGH (ref 150–400)
RBC: 4.96 MIL/uL (ref 3.87–5.11)
RDW: 14.3 % (ref 11.5–15.5)
WBC: 19.7 10*3/uL — ABNORMAL HIGH (ref 4.0–10.5)
nRBC: 0 % (ref 0.0–0.2)

## 2021-06-09 LAB — TYPE AND SCREEN
ABO/RH(D): B POS
Antibody Screen: NEGATIVE

## 2021-06-09 SURGERY — PANNICULECTOMY
Anesthesia: General | Site: Abdomen

## 2021-06-09 MED ORDER — OXYCODONE HCL 5 MG/5ML PO SOLN: 5.0000 mg | Freq: Once | ORAL | Status: DC | PRN

## 2021-06-09 MED ORDER — LIDOCAINE 2% (20 MG/ML) 5 ML SYRINGE
INTRAMUSCULAR | Status: DC | PRN
  Administered 2021-06-09: 100 mg via INTRAVENOUS

## 2021-06-09 MED ORDER — FENTANYL CITRATE (PF) 250 MCG/5ML IJ SOLN
INTRAMUSCULAR | Status: DC | PRN
Start: 2021-06-09 — End: 2021-06-09
  Administered 2021-06-09: 25 ug via INTRAVENOUS
  Administered 2021-06-09: 100 ug via INTRAVENOUS
  Administered 2021-06-09 (×3): 25 ug via INTRAVENOUS

## 2021-06-09 MED ORDER — DEXAMETHASONE SODIUM PHOSPHATE 10 MG/ML IJ SOLN
INTRAMUSCULAR | Status: AC
  Filled 2021-06-09: qty 1

## 2021-06-09 MED ORDER — SODIUM CHLORIDE 0.9 % IV SOLN
2.0000 g | Freq: Two times a day (BID) | INTRAVENOUS | Status: AC
  Administered 2021-06-09: 2 g via INTRAVENOUS
  Filled 2021-06-09: qty 2

## 2021-06-09 MED ORDER — LACTATED RINGERS IV SOLN: INTRAVENOUS | Status: DC

## 2021-06-09 MED ORDER — OXYCODONE HCL 5 MG PO TABS: 5.0000 mg | ORAL_TABLET | Freq: Once | ORAL | Status: DC | PRN

## 2021-06-09 MED ORDER — ORAL CARE MOUTH RINSE
15.0000 mL | Freq: Once | OROMUCOSAL | Status: AC
  Administered 2021-06-09: 15 mL via OROMUCOSAL

## 2021-06-09 MED ORDER — 0.9 % SODIUM CHLORIDE (POUR BTL) OPTIME
TOPICAL | Status: DC | PRN
  Administered 2021-06-09: 1000 mL

## 2021-06-09 MED ORDER — ONDANSETRON 4 MG PO TBDP
4.0000 mg | ORAL_TABLET | Freq: Four times a day (QID) | ORAL | Status: DC | PRN
  Administered 2021-06-10: 4 mg via ORAL
  Filled 2021-06-09 (×2): qty 1

## 2021-06-09 MED ORDER — DULOXETINE HCL 20 MG PO CPEP
20.0000 mg | ORAL_CAPSULE | Freq: Every day | ORAL | Status: DC
  Administered 2021-06-10 – 2021-06-11 (×2): 20 mg via ORAL
  Filled 2021-06-09 (×3): qty 1

## 2021-06-09 MED ORDER — APREPITANT 40 MG PO CAPS
40.0000 mg | ORAL_CAPSULE | ORAL | Status: AC
  Administered 2021-06-09: 40 mg via ORAL
  Filled 2021-06-09: qty 1

## 2021-06-09 MED ORDER — HEPARIN SODIUM (PORCINE) 5000 UNIT/ML IJ SOLN
5000.0000 [IU] | Freq: Three times a day (TID) | INTRAMUSCULAR | Status: DC
  Administered 2021-06-09 – 2021-06-12 (×8): 5000 [IU] via SUBCUTANEOUS
  Filled 2021-06-09 (×8): qty 1

## 2021-06-09 MED ORDER — METOPROLOL TARTRATE 5 MG/5ML IV SOLN: 5.0000 mg | Freq: Four times a day (QID) | INTRAVENOUS | Status: DC | PRN

## 2021-06-09 MED ORDER — MEPERIDINE HCL 50 MG/ML IJ SOLN: 6.2500 mg | INTRAMUSCULAR | Status: DC | PRN

## 2021-06-09 MED ORDER — PROPOFOL 10 MG/ML IV BOLUS
INTRAVENOUS | Status: DC | PRN
  Administered 2021-06-09: 150 mg via INTRAVENOUS

## 2021-06-09 MED ORDER — HYDROMORPHONE HCL 1 MG/ML IJ SOLN
0.2500 mg | INTRAMUSCULAR | Status: DC | PRN
  Administered 2021-06-09 (×4): 0.5 mg via INTRAVENOUS

## 2021-06-09 MED ORDER — MIDAZOLAM HCL 2 MG/2ML IJ SOLN
INTRAMUSCULAR | Status: DC | PRN
  Administered 2021-06-09: 2 mg via INTRAVENOUS

## 2021-06-09 MED ORDER — KCL IN DEXTROSE-NACL 20-5-0.45 MEQ/L-%-% IV SOLN
INTRAVENOUS | Status: DC
  Filled 2021-06-09 (×5): qty 1000

## 2021-06-09 MED ORDER — FENTANYL CITRATE (PF) 100 MCG/2ML IJ SOLN
INTRAMUSCULAR | Status: AC
  Filled 2021-06-09: qty 2

## 2021-06-09 MED ORDER — CHLORHEXIDINE GLUCONATE 0.12 % MT SOLN: 15.0000 mL | Freq: Once | OROMUCOSAL | Status: AC

## 2021-06-09 MED ORDER — PHENYLEPHRINE 40 MCG/ML (10ML) SYRINGE FOR IV PUSH (FOR BLOOD PRESSURE SUPPORT)
PREFILLED_SYRINGE | INTRAVENOUS | Status: DC | PRN
  Administered 2021-06-09 (×5): 80 ug via INTRAVENOUS

## 2021-06-09 MED ORDER — KETAMINE HCL 10 MG/ML IJ SOLN
INTRAMUSCULAR | Status: DC | PRN
  Administered 2021-06-09: 30 mg via INTRAVENOUS
  Administered 2021-06-09 (×2): 10 mg via INTRAVENOUS

## 2021-06-09 MED ORDER — BUPIVACAINE LIPOSOME 1.3 % IJ SUSP: 20.0000 mL | Freq: Once | INTRAMUSCULAR | Status: DC

## 2021-06-09 MED ORDER — HEPARIN SODIUM (PORCINE) 5000 UNIT/ML IJ SOLN
5000.0000 [IU] | INTRAMUSCULAR | Status: AC
  Administered 2021-06-09: 5000 [IU] via SUBCUTANEOUS
  Filled 2021-06-09: qty 1

## 2021-06-09 MED ORDER — ONDANSETRON HCL 4 MG/2ML IJ SOLN
INTRAMUSCULAR | Status: AC
  Filled 2021-06-09: qty 2

## 2021-06-09 MED ORDER — SODIUM CHLORIDE 0.9 % IV SOLN
2.0000 g | INTRAVENOUS | Status: AC
  Administered 2021-06-09: 2 g via INTRAVENOUS
  Filled 2021-06-09: qty 2

## 2021-06-09 MED ORDER — ONDANSETRON HCL 4 MG/2ML IJ SOLN
INTRAMUSCULAR | Status: DC | PRN
  Administered 2021-06-09: 4 mg via INTRAVENOUS

## 2021-06-09 MED ORDER — OXYCODONE HCL 5 MG PO TABS
5.0000 mg | ORAL_TABLET | ORAL | Status: DC | PRN
  Administered 2021-06-09 – 2021-06-12 (×11): 10 mg via ORAL
  Filled 2021-06-09 (×11): qty 2

## 2021-06-09 MED ORDER — SCOPOLAMINE 1 MG/3DAYS TD PT72
1.0000 | MEDICATED_PATCH | TRANSDERMAL | Status: DC
  Administered 2021-06-09: 1.5 mg via TRANSDERMAL
  Filled 2021-06-09: qty 1

## 2021-06-09 MED ORDER — AMISULPRIDE (ANTIEMETIC) 5 MG/2ML IV SOLN: 10.0000 mg | Freq: Once | INTRAVENOUS | Status: DC | PRN

## 2021-06-09 MED ORDER — FENTANYL CITRATE PF 50 MCG/ML IJ SOSY
12.5000 ug | PREFILLED_SYRINGE | INTRAMUSCULAR | Status: DC | PRN
  Administered 2021-06-11: 12.5 ug via INTRAVENOUS
  Filled 2021-06-09: qty 1

## 2021-06-09 MED ORDER — CHLORHEXIDINE GLUCONATE CLOTH 2 % EX PADS: 6.0000 | MEDICATED_PAD | Freq: Once | CUTANEOUS | Status: DC

## 2021-06-09 MED ORDER — HYDROMORPHONE HCL 1 MG/ML IJ SOLN
INTRAMUSCULAR | Status: AC
  Filled 2021-06-09: qty 2

## 2021-06-09 MED ORDER — KETAMINE HCL 50 MG/5ML IJ SOSY
PREFILLED_SYRINGE | INTRAMUSCULAR | Status: AC
  Filled 2021-06-09: qty 5

## 2021-06-09 MED ORDER — PANTOPRAZOLE SODIUM 40 MG IV SOLR
40.0000 mg | Freq: Every day | INTRAVENOUS | Status: DC
  Administered 2021-06-09 – 2021-06-11 (×3): 40 mg via INTRAVENOUS
  Filled 2021-06-09: qty 40
  Filled 2021-06-09 (×2): qty 10

## 2021-06-09 MED ORDER — ROCURONIUM BROMIDE 10 MG/ML (PF) SYRINGE
PREFILLED_SYRINGE | INTRAVENOUS | Status: AC
  Filled 2021-06-09: qty 10

## 2021-06-09 MED ORDER — PROMETHAZINE HCL 25 MG/ML IJ SOLN: 6.2500 mg | INTRAMUSCULAR | Status: DC | PRN

## 2021-06-09 MED ORDER — MIDAZOLAM HCL 2 MG/2ML IJ SOLN
INTRAMUSCULAR | Status: AC
  Filled 2021-06-09: qty 2

## 2021-06-09 MED ORDER — DEXAMETHASONE SODIUM PHOSPHATE 10 MG/ML IJ SOLN
INTRAMUSCULAR | Status: DC | PRN
  Administered 2021-06-09: 8 mg via INTRAVENOUS

## 2021-06-09 MED ORDER — ACETAMINOPHEN 500 MG PO TABS
1000.0000 mg | ORAL_TABLET | ORAL | Status: AC
  Administered 2021-06-09: 1000 mg via ORAL
  Filled 2021-06-09: qty 2

## 2021-06-09 MED ORDER — PROPOFOL 10 MG/ML IV BOLUS
INTRAVENOUS | Status: AC
  Filled 2021-06-09: qty 20

## 2021-06-09 MED ORDER — ONDANSETRON HCL 4 MG/2ML IJ SOLN: 4.0000 mg | Freq: Four times a day (QID) | INTRAMUSCULAR | Status: DC | PRN

## 2021-06-09 MED ORDER — SUCCINYLCHOLINE CHLORIDE 200 MG/10ML IV SOSY
PREFILLED_SYRINGE | INTRAVENOUS | Status: DC | PRN
  Administered 2021-06-09: 140 mg via INTRAVENOUS

## 2021-06-09 SURGICAL SUPPLY — 37 items
APL SWBSTK 6 STRL LF DISP (MISCELLANEOUS) ×1
APPLICATOR COTTON TIP 6 STRL (MISCELLANEOUS) ×1 IMPLANT
APPLICATOR COTTON TIP 6IN STRL (MISCELLANEOUS) ×2
BAG COUNTER SPONGE SURGICOUNT (BAG) IMPLANT
BAG SPNG CNTER NS LX DISP (BAG)
BLADE EXTENDED COATED 6.5IN (ELECTRODE) IMPLANT
BLADE SURG SZ10 CARB STEEL (BLADE) IMPLANT
COVER SURGICAL LIGHT HANDLE (MISCELLANEOUS) ×2 IMPLANT
DRAPE WARM FLUID 44X44 (DRAPES) ×2 IMPLANT
DRESSING PREVENA PLUS CUSTOM (GAUZE/BANDAGES/DRESSINGS) IMPLANT
DRSG PREVENA PLUS CUSTOM (GAUZE/BANDAGES/DRESSINGS) ×2
ELECT REM PT RETURN 15FT ADLT (MISCELLANEOUS) ×2 IMPLANT
GAUZE SPONGE 4X4 12PLY STRL (GAUZE/BANDAGES/DRESSINGS) ×2 IMPLANT
GLOVE SURG ENC TEXT LTX SZ8 (GLOVE) ×2 IMPLANT
GLOVE SURG UNDER POLY LF SZ7 (GLOVE) ×2 IMPLANT
GOWN SPEC L4 XLG W/TWL (GOWN DISPOSABLE) ×2 IMPLANT
GOWN STRL REUS W/TWL LRG LVL3 (GOWN DISPOSABLE) ×2 IMPLANT
GOWN STRL REUS W/TWL XL LVL3 (GOWN DISPOSABLE) ×6 IMPLANT
HANDLE SUCTION POOLE (INSTRUMENTS) IMPLANT
KIT BASIN OR (CUSTOM PROCEDURE TRAY) ×2 IMPLANT
KIT TURNOVER KIT A (KITS) IMPLANT
LIGASURE IMPACT 36 18CM CVD LR (INSTRUMENTS) ×1 IMPLANT
LIGASURE SHORT ATLAS 20CM (ELECTROSURGICAL) ×1 IMPLANT
NS IRRIG 1000ML POUR BTL (IV SOLUTION) ×4 IMPLANT
PACK GENERAL/GYN (CUSTOM PROCEDURE TRAY) ×2 IMPLANT
PACK UNIVERSAL I (CUSTOM PROCEDURE TRAY) ×2 IMPLANT
SPONGE T-LAP 18X18 ~~LOC~~+RFID (SPONGE) ×4 IMPLANT
STAPLER VISISTAT 35W (STAPLE) ×2 IMPLANT
SUCTION POOLE HANDLE (INSTRUMENTS)
SUT ETHILON 3 0 PS 1 (SUTURE) ×8 IMPLANT
SUT NOVA 1 T20/GS 25DT (SUTURE) ×1 IMPLANT
SUT SILK 2 0 (SUTURE)
SUT SILK 2-0 18XBRD TIE 12 (SUTURE) ×1 IMPLANT
SUT VIC AB 3-0 SH 18 (SUTURE) ×13 IMPLANT
SUT VIC AB 4-0 SH 18 (SUTURE) IMPLANT
TOWEL OR 17X26 10 PK STRL BLUE (TOWEL DISPOSABLE) ×4 IMPLANT
TRAY FOLEY MTR SLVR 16FR STAT (SET/KITS/TRAYS/PACK) ×2 IMPLANT

## 2021-06-09 NOTE — Op Note (Addendum)
Crystal Garza  09-15-1970   06/09/2021    PCP:  Dickie La, MD   Surgeon: Kaylyn Lim, MD, FACS  Asst:  Candida Peeling, MD  Anes:  general  Preop Dx: Abdominal panniculitis Postop Dx: same  Procedure: Abdominal panniculectomy (~ 14 lbs) Location Surgery: [facility] OR 2 Complications: None noted  EBL:   30 cc  Drains: None but Provena VAC  Description of Procedure:  The patient was taken to OR 2 .  After anesthesia was administered and the patient was scrubbed and prepped with chloroprep  and a timeout was performed.  I had marked her in the standing position in the holding area with an indelible marker and then examined her pannus again in the supine position and remarked her.  Incision which amounted to a very large ellipse that also included her umbilicus was created.  We carried through the subcu with the Bovie.  We then began using the ligature to transect the fatty tissue going down to the external oblique fascia and carrying this medially across the rectus.  Umbilical defect was dissected free at the base and divided and a figure-of-eight suture of #1 Novafil was was passed to seal that area off.  Small area of the anterior fascia was opened on the left side and this was repaired with 3 sutures Novafil in a figure-of-eight suture.  Pannus was removed and sent to pathology and they reported that it was a about 13.6 pounds.  We irrigated and there was hemostasis present.  We began multilayer closure using 3-0 Vicryl tacking the subcu down to the fascia and then coming outward eventually being at the layer of the subdermis.  This brought the tissue together nicely.  We then completed the closure with too numerous for me to count vertical mattress sutures of 4-0 nylon which coapted the tissue nicely and created a nice closure.  Wound was cleaned up and the [facility] system was applied and placed to the VAC.  Patient will have an abdominal binder and be taken recovery room and moved upstairs  for postoperative recovery.  She takes Xarelto for history of pulmonary embolus and will likely need to keep her on heparin until such times as we are able to resume the Xarelto.  She is at increased risk from bleeding from this.  She takes the Xarelto for history of pulmonary embolism.  I was personally present during the key and critical portions of this procedure and immediately available throughout the entire procedure, as documented in my operative note.   The patient tolerated the procedure well and was taken to the PACU in stable condition.     Matt B. Hassell Done, [place], [facility], [place]

## 2021-06-09 NOTE — Progress Notes (Signed)
Patient to room from pacu report received from [facility], pt ambulated to bed, wound vac dressing dry and intact, pt alert and oriented.

## 2021-06-09 NOTE — Interval H&P Note (Signed)
History and Physical Interval Note:  06/09/2021 1:18 PM  Crystal Garza  has presented today for surgery, with the diagnosis of PANNICULITIS, ACUTE ADBOMINAL PAIN.  The various methods of treatment have been discussed with the patient and family. After consideration of risks, benefits and other options for treatment, the patient has consented to  Procedure(s): PANNICULECTOMY (N/A) as a surgical intervention.  The patient's history has been reviewed, patient examined, no change in status, stable for surgery.  I have reviewed the patient's chart and labs.  Questions were answered to the patient's satisfaction.     [name]

## 2021-06-09 NOTE — Interval H&P Note (Signed)
History and Physical Interval Note:  06/09/2021 12:00 PM  Crystal Garza  has presented today for surgery, with the diagnosis of PANNICULITIS, ACUTE ADBOMINAL PAIN.  The various methods of treatment have been discussed with the patient and family. After consideration of risks, benefits and other options for treatment, the patient has consented to  Procedure(s): PANNICULECTOMY (N/A) as a surgical intervention.  The patient's history has been reviewed, patient examined, no change in status, stable for surgery.  I have reviewed the patient's chart and labs.  Questions were answered to the patient's satisfaction.     [name]

## 2021-06-09 NOTE — Anesthesia Postprocedure Evaluation (Signed)
Anesthesia Post Note  Patient: Crystal Garza  Procedure(s) Performed: PANNICULECTOMY (Abdomen)     Patient location during evaluation: PACU Anesthesia Type: General Level of consciousness: awake and alert Pain management: pain level controlled Vital Signs Assessment: post-procedure vital signs reviewed and stable Respiratory status: spontaneous breathing, nonlabored ventilation and respiratory function stable Cardiovascular status: blood pressure returned to baseline and stable Postop Assessment: no apparent nausea or vomiting Anesthetic complications: no   No notable events documented.  Last Vitals:  Vitals:   06/09/21 1800 06/09/21 1815  BP: 129/70 130/74  Pulse: 80 72  Resp: 15 13  Temp:    SpO2: 100% 100%    Last Pain:  Vitals:   06/09/21 1815  PainSc: 2                  Lynda Rainwater

## 2021-06-09 NOTE — Transfer of Care (Signed)
Immediate Anesthesia Transfer of Care Note  Patient: Crystal Garza  Procedure(s) Performed: PANNICULECTOMY (Abdomen)  Patient Location: PACU  Anesthesia Type:General  Level of Consciousness: awake  Airway & Oxygen Therapy: Patient Spontanous Breathing and Patient connected to face mask oxygen  Post-op Assessment: Report given to RN and Post -op Vital signs reviewed and stable  Post vital signs: Reviewed and stable  Last Vitals:  Vitals Value Taken Time  BP 132/52 06/09/21 1716  Temp 36.6 C 06/09/21 1715  Pulse 74 06/09/21 1718  Resp    SpO2 100 % 06/09/21 1718  Vitals shown include unvalidated device data.  Last Pain: There were no vitals filed for this visit.       Complications: No notable events documented.

## 2021-06-09 NOTE — Anesthesia Preprocedure Evaluation (Signed)
Anesthesia Evaluation  Patient identified by MRN, date of birth, ID band Patient awake    Reviewed: Allergy & Precautions, NPO status , Patient's Chart, lab work & pertinent test results  Airway Mallampati: II  TM Distance: >3 FB Neck ROM: Full    Dental no notable dental hx. (+) Edentulous Upper, Upper Dentures, Poor Dentition, Missing,    Pulmonary neg pulmonary ROS,    Pulmonary exam normal breath sounds clear to auscultation       Cardiovascular hypertension, Pt. on medications + DVT  Normal cardiovascular exam Rhythm:Regular Rate:Normal  Cath 1/19  Angiographically normal coronary arteries with only minimal CAD.  The left ventricular systolic function is normal. The left ventricular ejection fraction is 55-65% by visual estimate.  LV end diastolic pressure is moderately elevated. 20 mmHg     Neuro/Psych  Headaches, Anxiety Depression Bipolar Disorder    GI/Hepatic Neg liver ROS, GERD  Medicated,  Endo/Other  Morbid obesity  Renal/GU Renal InsufficiencyRenal disease  negative genitourinary   Musculoskeletal  (+) Arthritis , Osteoarthritis,    Abdominal (+) + obese,   Peds negative pediatric ROS (+)  Hematology  (+) Blood dyscrasia, anemia ,   Anesthesia Other Findings   Reproductive/Obstetrics negative OB ROS                             Anesthesia Physical  Anesthesia Plan  ASA: III  Anesthesia Plan: General   Post-op Pain Management:    Induction: Intravenous  PONV Risk Score and Plan: 3 and Ondansetron, Dexamethasone, Treatment may vary due to age or medical condition and Midazolam  Airway Management Planned: Oral ETT  Additional Equipment:   Intra-op Plan:   Post-operative Plan: Extubation in OR  Informed Consent: I have reviewed the patients History and Physical, chart, labs and discussed the procedure including the risks, benefits and alternatives for the  proposed anesthesia with the patient or authorized representative who has indicated his/her understanding and acceptance.     Dental advisory given  Plan Discussed with: CRNA, Surgeon and Anesthesiologist  Anesthesia Plan Comments:         Anesthesia Quick Evaluation

## 2021-06-09 NOTE — Anesthesia Procedure Notes (Signed)
Procedure Name: Intubation Date/Time: 06/09/2021 2:05 PM Performed by: Milford Cage, CRNA Pre-anesthesia Checklist: Patient identified, Emergency Drugs available, Suction available and Patient being monitored Patient Re-evaluated:Patient Re-evaluated prior to induction Oxygen Delivery Method: Circle system utilized Preoxygenation: Pre-oxygenation with 100% oxygen Induction Type: IV induction Ventilation: Mask ventilation without difficulty Laryngoscope Size: Miller and 2 Grade View: Grade I Tube type: Oral Tube size: 7.0 mm Number of attempts: 1 Airway Equipment and Method: Stylet Placement Confirmation: ETT inserted through vocal cords under direct vision, positive ETCO2 and breath sounds checked- equal and bilateral Secured at: 20 cm Tube secured with: Tape Dental Injury: Teeth and Oropharynx as per pre-operative assessment

## 2021-06-09 NOTE — H&P (Signed)
Chief Complaint:  large abdominal pannus with maceration and irritation  History of Present Illness:  Crystal Garza is an 51 y.o. female who has undergone bariatric surgery and has lost > 100 lbs.  She has a history of PE and takes Xarelto and has been off that since Friday.    She has had more trouble from her abdominal pannus and desired excision.   Past Medical History:  Diagnosis Date   Anemia    iron def   Anginal pain (HCC)    Anxiety    Arthritis    left knee    Back pain    Benign gastrointestinal stromal tumor (GIST)    Chronic knee pain    Depression    DVT (deep venous thrombosis) (HCC)    Esophageal dysmotility    GERD (gastroesophageal reflux disease)    Hiatal hernia    History of kidney stones    Hx of laparoscopic gastric banding    Hypertension    Internal hemorrhoids    Internal hemorrhoids    Migraine    Morbid obesity ([place])    PE (pulmonary embolism)     Past Surgical History:  Procedure Laterality Date   39 HOUR [place] STUDY N/A 05/13/2016   Procedure: 24 HOUR [place] STUDY;  Surgeon: Manus Gunning, MD;  [facility]: [facility];  Service: Gastroenterology;  Laterality: N/A;   BIOPSY  02/16/2012   Procedure: BIOPSY;  Surgeon: [name], MD;  [facility]: [facility];  Service: General;;  biopsy of mass x 2   BREATH TEK H PYLORI  07/28/2011   Procedure: BREATH TEK H PYLORI;  Surgeon: [name], MD;  [facility];  Service: General;  Laterality: N/A;  to be done at [facility]  05/1991   COLONOSCOPY WITH PROPOFOL N/A 03/03/2016   Procedure: COLONOSCOPY WITH PROPOFOL;  Surgeon: Manus Gunning, MD;  [facility];  Service: Gastroenterology;  Laterality: N/A;   ESOPHAGEAL MANOMETRY N/A 05/13/2016   Procedure: ESOPHAGEAL MANOMETRY (EM);  Surgeon: Manus Gunning, MD;  [facility];  Service: Gastroenterology;  Laterality: N/A;   ESOPHAGOGASTRODUODENOSCOPY N/A 08/29/2012   Procedure:  ESOPHAGOGASTRODUODENOSCOPY (EGD);  Surgeon: [name], MD;  [facility];  Service: General;  Laterality: N/A;   ESOPHAGOGASTRODUODENOSCOPY N/A 09/04/2014   Procedure: ESOPHAGOGASTRODUODENOSCOPY (EGD);  Surgeon: Alphonsa Overall, MD;  [facility];  Service: General;  Laterality: N/A;   ESOPHAGOGASTRODUODENOSCOPY (EGD) WITH PROPOFOL N/A 03/03/2016   Procedure: ESOPHAGOGASTRODUODENOSCOPY (EGD) WITH PROPOFOL;  Surgeon: Manus Gunning, MD;  [facility];  Service: Gastroenterology;  Laterality: N/A;   EUS  03/03/2012   Procedure: UPPER ENDOSCOPIC ULTRASOUND (EUS) LINEAR;  Surgeon: Milus Banister, MD;  [facility];  Service: Endoscopy;  Laterality: N/A;   GASTRIC ROUX-EN-Y N/A 12/07/2017   Procedure: LAPAROSCOPIC ROUX-EN-Y GASTRIC BYPASS WITH LYSIS OF ADHESIONS AND UPPER ENDOSCOPY ERAS Pathway;  Surgeon: Johnathan Hausen, MD;  [facility];  Service: General;  Laterality: N/A;   gist     KNEE ARTHROSCOPY     LAPAROSCOPIC GASTRECTOMY  04/08/2012   Procedure: LAPAROSCOPIC GASTRECTOMY;  Surgeon: [name], MD;  [facility];  Service: General;;  removal of GIST tumor of stomach   LAPAROSCOPIC GASTRIC SLEEVE RESECTION N/A 08/29/2012   Procedure: LAPAROSCOPIC GASTRIC SLEEVE RESECTION;  Surgeon: [name], MD;  [facility];  Service: General;  Laterality: N/A;  Sleeve Gastrectomy   LEFT HEART CATH AND CORONARY ANGIOGRAPHY N/A 05/28/2017  Procedure: LEFT HEART CATH AND CORONARY ANGIOGRAPHY;  Surgeon: Leonie Man, MD;  [facility];  Service: Cardiovascular;  Laterality: N/A;   ROUX-EN-Y GASTRIC BYPASS     TOTAL ABDOMINAL HYSTERECTOMY  04/2004   TOTAL KNEE ARTHROPLASTY Left 10/06/2013   Procedure: LEFT TOTAL KNEE ARTHROPLASTY;  Surgeon: Mcarthur Rossetti, MD;  [facility];  Service: Orthopedics;  Laterality: Left;   TUBAL LIGATION      Current Facility-Administered Medications  Medication Dose Route  Frequency Provider Last Rate Last Admin   bupivacaine liposome (EXPAREL) 1.3 % injection 266 mg  20 mL Infiltration Once Johnathan Hausen, MD       cefoTEtan (CEFOTAN) 2 g in sodium chloride 0.9 % 100 mL IVPB  2 g Intravenous On Call to OR Johnathan Hausen, MD       Chlorhexidine Gluconate Cloth 2 % PADS 6 each  6 each Topical Once Johnathan Hausen, MD       lactated ringers infusion   Intravenous Continuous Audry Pili, MD 10 mL/hr at 06/09/21 1137 New Bag at 06/09/21 1137   scopolamine (TRANSDERM-SCOP) 1 MG/3DAYS 1.5 mg  1 patch Transdermal On Call to OR Johnathan Hausen, MD   1.5 mg at 06/09/21 1139   Codeine and Tuna [fish allergy] Family History  Problem Relation Age of Onset   Diabetes Mother    Diabetes Father    Heart disease Father    Colon cancer Father        brain tumor   Colon polyps Father    Colitis Father    Irritable bowel syndrome Father    Diabetes Maternal Grandmother    Diabetes Maternal Grandfather    Schizophrenia Maternal Grandfather    Heart disease Paternal Uncle    Diabetes Paternal Uncle    Heart disease Paternal Grandmother    Heart disease Paternal Grandfather    Diabetes Maternal Aunt    Diabetes Maternal Uncle    Diabetes Paternal Aunt    Cancer Other    Hypertension Other    Esophageal cancer Neg Hx    Social History:   reports that she has never smoked. She has never used smokeless tobacco. She reports that she does not drink alcohol and does not use drugs.   REVIEW OF SYSTEMS : Negative except for see long problem list  Physical Exam:   There were no vitals taken for this visit. There is no height or weight on file to calculate BMI.  Gen:  WDWN AAF NAD  Neurological: Alert and oriented to person, place, and time. Motor and sensory function is grossly intact  Head: Normocephalic and atraumatic.  Eyes: Conjunctivae are normal. Pupils are equal, round, and reactive to light. No scleral icterus.  Neck: Normal range of motion. Neck supple.  No tracheal deviation or thyromegaly present.  Cardiovascular:  SR without murmurs or gallops.  No carotid bruits Breast:  not examined Respiratory: Effort normal.  No respiratory distress. No chest wall tenderness. Breath sounds normal.  No wheezes, rales or rhonchi.  Abdomen:  large draping pannus with underlying maceration GU:  draped by pannue Musculoskeletal: Normal range of motion. Extremities are nontender. No cyanosis, edema or clubbing noted Lymphadenopathy: No cervical, preauricular, postauricular or axillary adenopathy is present Skin: Skin is warm and dry. No rash noted. No diaphoresis. No erythema. No pallor. Pscyh: Normal mood and affect. Behavior is normal. Judgment and thought content normal.   LABORATORY RESULTS: No results found. However, due to the size of the  patient record, not all encounters were searched. Please check Results Review for a complete set of results.   RADIOLOGY RESULTS: No results found.  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   History of pulmonary embolism 05/25/2017   Esophageal dysphagia    Family history of colon cancer    Macromastia 02/27/2015   Chronic neck and back pain 02/27/2015   Medication management 08/22/2014   Chronic anticoagulation 05/17/2014   Degenerative arthritis of left knee 10/06/2013   Status post total knee replacement 10/06/2013   Chronic pain 06/13/2013   Lap Sleeve Gastrectomy May 2014 09/16/2012   Gastrointestinal stromal tumor (GIST) of stomach ([place]) 02/17/2012    Nephrolithiasis 02/20/2011   Iron deficiency anemia 06/27/2010   Anxiety 06/27/2010   Migraine 09/12/2009   Morbid obesity-BMI 62 10/30/2008   Major depressive disorder, recurrent episode ([place]) 07/01/2006   HYPERTENSION, BENIGN SYSTEMIC 07/01/2006   Gastroesophageal reflux disease 07/01/2006    Assessment & Plan: Abdominal panniculitis for panniculectomy.      Matt B. Hassell Done, MD, [facility] [phone] beeper [phone]  06/09/2021 11:57 AM

## 2021-06-10 ENCOUNTER — Encounter (HOSPITAL_COMMUNITY): Payer: Self-pay | Admitting: Surgery

## 2021-06-10 LAB — CBC
HCT: 40.2 % (ref 36.0–46.0)
Hemoglobin: 12.1 g/dL (ref 12.0–15.0)
MCH: 26.7 pg (ref 26.0–34.0)
MCHC: 30.1 g/dL (ref 30.0–36.0)
MCV: 88.5 fL (ref 80.0–100.0)
Platelets: 408 10*3/uL — ABNORMAL HIGH (ref 150–400)
RBC: 4.54 MIL/uL (ref 3.87–5.11)
RDW: 14.3 % (ref 11.5–15.5)
WBC: 17.6 10*3/uL — ABNORMAL HIGH (ref 4.0–10.5)
nRBC: 0 % (ref 0.0–0.2)

## 2021-06-10 LAB — BASIC METABOLIC PANEL
Anion gap: 9 (ref 5–15)
BUN: 8 mg/dL (ref 6–20)
CO2: 23 mmol/L (ref 22–32)
Calcium: 9.2 mg/dL (ref 8.9–10.3)
Chloride: 105 mmol/L (ref 98–111)
Creatinine, Ser: 0.73 mg/dL (ref 0.44–1.00)
GFR, Estimated: >60 mL/min (ref >60)
Glucose, Bld: 183 mg/dL — ABNORMAL HIGH (ref 70–99)
Potassium: 4 mmol/L (ref 3.5–5.1)
Sodium: 137 mmol/L (ref 135–145)

## 2021-06-10 LAB — SURGICAL PATHOLOGY

## 2021-06-10 NOTE — Progress Notes (Signed)
°  Transition of Care (TOC) Screening Note   Patient Details  Name: Crystal Garza Date of Birth: [birth year]   Transition of Care [facility]) CM/SW Contact:    Lennart Pall, LCSW Phone Number: 06/10/2021, 12:34 PM    Transition of Care Department [facility]) has reviewed patient and no TOC needs have been identified at this time. We will continue to monitor patient advancement through interdisciplinary progression rounds. If new patient transition needs arise, please place a TOC consult.

## 2021-06-10 NOTE — Progress Notes (Signed)
Patient ID: Crystal Garza, female   DOB: July 21, 1970, 51 y.o.   MRN: [id number] [facility] Surgery Progress Note:   1 Day Post-Op  Subjective: Mental status is clear.  Complaints minimal. Objective: Vital signs in last 24 hours: Temp:  [97.6 F (36.4 C)-98.1 F (36.7 C)] 97.6 F (36.4 C) (02/07 0527) Pulse Rate:  [63-82] 63 (02/07 0527) Resp:  [12-19] 13 (02/07 0527) BP: (117-144)/(52-78) 130/78 (02/07 0527) SpO2:  [93 %-100 %] 100 % (02/07 0527) Weight:  [123.4 kg] 123.4 kg (02/06 2200)  Intake/Output from previous day: 02/06 0701 - 02/07 0700 In: 3254.8 [P.O.:960; I.V.:2194.8; IV Piggyback:100] Out: 200 [Blood:200] Intake/Output this shift: No intake/output data recorded.  Physical Exam: Work of breathing is normal;  VAC to [employer].   Lab Results:  Results for orders placed or performed during the hospital encounter of 06/09/21 (from the past 48 hour(s))  Type and screen [place]     Status: None   Collection Time: 06/09/21 11:25 AM  Result Value Ref Range   ABO/RH(D) B POS    Antibody Screen NEG    Sample Expiration      06/12/2021,2359 Performed at [facility], [place] [address] [place], [place] [address]   Creatinine, serum     Status: None   Collection Time: 06/09/21  8:16 PM  Result Value Ref Range   Creatinine, Ser 0.80 0.44 - 1.00 mg/dL   GFR, Estimated >60 >60 mL/min    Comment: (NOTE) Calculated using the CKD-EPI Creatinine Equation (2021) Performed at [facility], [place] [address], [place], [place] [address]   CBC     Status: Abnormal   Collection Time: 06/09/21  8:48 PM  Result Value Ref Range   WBC 19.7 (H) 4.0 - 10.5 K/uL   RBC 4.96 3.87 - 5.11 MIL/uL   Hemoglobin 13.3 12.0 - 15.0 g/dL   HCT 43.6 36.0 - 46.0 %   MCV 87.9 80.0 - 100.0 fL   MCH 26.8 26.0 - 34.0 pg   MCHC 30.5 30.0 - 36.0 g/dL   RDW 14.3 11.5 - 15.5 %   Platelets 437 (H) 150 - 400 K/uL   nRBC 0.0 0.0 - 0.2 %     Comment: Performed at [facility], [place] [address], [place], [place] [address]  Basic metabolic panel     Status: Abnormal   Collection Time: 06/10/21  3:26 AM  Result Value Ref Range   Sodium 137 135 - 145 mmol/L   Potassium 4.0 3.5 - 5.1 mmol/L   Chloride 105 98 - 111 mmol/L   CO2 23 22 - 32 mmol/L   Glucose, Bld 183 (H) 70 - 99 mg/dL    Comment: Glucose reference range applies only to samples taken after fasting for at least 8 hours.   BUN 8 6 - 20 mg/dL   Creatinine, Ser 0.73 0.44 - 1.00 mg/dL   Calcium 9.2 8.9 - 10.3 mg/dL   GFR, Estimated >60 >60 mL/min    Comment: (NOTE) Calculated using the CKD-EPI Creatinine Equation (2021)    Anion gap 9 5 - 15    Comment: Performed at [facility], [place] [address] [place], [place] [address]  CBC     Status: Abnormal   Collection Time: 06/10/21  3:26 AM  Result Value Ref Range   WBC 17.6 (H) 4.0 - 10.5 K/uL   RBC 4.54 3.87 - 5.11 MIL/uL   Hemoglobin 12.1 12.0 - 15.0 g/dL  HCT 40.2 36.0 - 46.0 %   MCV 88.5 80.0 - 100.0 fL   MCH 26.7 26.0 - 34.0 pg   MCHC 30.1 30.0 - 36.0 g/dL   RDW 14.3 11.5 - 15.5 %   Platelets 408 (H) 150 - 400 K/uL   nRBC 0.0 0.0 - 0.2 %    Comment: Performed at [facility], [place] [address] [place], [place] [address]   *Note: Due to a large number of results and/or encounters for the requested time period, some results have not been displayed. A complete set of results can be found in Results Review.    Radiology/Results: No results found.  Anti-infectives: Anti-infectives (From admission, onward)    Start     Dose/Rate Route Frequency Ordered Stop   06/09/21 2200  cefoTEtan (CEFOTAN) 2 g in sodium chloride 0.9 % 100 mL IVPB        2 g 200 mL/hr over 30 Minutes Intravenous Every 12 hours 06/09/21 1844 06/09/21 2253   06/09/21 1115  cefoTEtan (CEFOTAN) 2 g in sodium chloride 0.9 % 100 mL IVPB        2 g 200 mL/hr over 30 Minutes Intravenous On call  to O.R. 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   History of pulmonary embolism 05/25/2017   Esophageal dysphagia    Family history of colon cancer    Macromastia 02/27/2015   Chronic neck and back pain 02/27/2015   Medication management 08/22/2014   Chronic anticoagulation 05/17/2014   Degenerative arthritis of left knee 10/06/2013   Status post total knee replacement 10/06/2013   Chronic pain 06/13/2013   Lap Sleeve Gastrectomy May 2014 09/16/2012   Gastrointestinal stromal tumor (GIST) of stomach ([place]) 02/17/2012   Nephrolithiasis 02/20/2011   Iron deficiency anemia 06/27/2010   Anxiety 06/27/2010   Migraine 09/12/2009   Morbid obesity-BMI 62 10/30/2008   Major depressive disorder, recurrent episode ([place]) 07/01/2006   HYPERTENSION, BENIGN SYSTEMIC 07/01/2006   Gastroesophageal reflux disease 07/01/2006    Advance diet.  Mobilize.  Squeezers were placed this morning.  On postop heparin.  Not ready to resume Xaralto.   1 Day Post-Op    LOS: 1 day   Matt B. Hassell Done, MD, [facility] [phone] to reach the surgeon on call.    06/10/2021 7:30 AM

## 2021-06-10 NOTE — Plan of Care (Signed)
  Problem: Education: Goal: Knowledge of General Education information will improve Description: Including pain rating scale, medication(s)/side effects and non-pharmacologic comfort measures Outcome: Progressing   Problem: Activity: Goal: Risk for activity intolerance will decrease Outcome: Progressing   Problem: Nutrition: Goal: Adequate nutrition will be maintained Outcome: Progressing   Problem: Coping: Goal: Level of anxiety will decrease Outcome: Progressing   

## 2021-06-11 NOTE — Progress Notes (Signed)
Patient ID: Crystal Garza, female   DOB: [birth year], 51 y.o.   MRN: [id number] [facility] Surgery Progress Note:   2 Days Post-Op  Subjective: Mental status is clear.  Complaints sore. Objective: Vital signs in last 24 hours: Temp:  [98.2 F (36.8 C)-98.5 F (36.9 C)] 98.2 F (36.8 C) (02/08 0626) Pulse Rate:  [75-81] 75 (02/08 0626) Resp:  [16] 16 (02/08 0626) BP: (107-113)/(54-60) 113/60 (02/08 0626) SpO2:  [96 %] 96 % (02/08 0626)  Intake/Output from previous day: 02/07 0701 - 02/08 0700 In: 2649.7 [P.O.:1080; I.V.:1569.7] Out: 0  Intake/Output this shift: Total I/O In: 358.2 [I.V.:358.2] Out: -   Physical Exam: Work of breathing is normal.  PRovena in place.    Lab Results:  Results for orders placed or performed during the hospital encounter of 06/09/21 (from the past 48 hour(s))  Surgical pathology     Status: None   Collection Time: 06/09/21  2:45 PM  Result Value Ref Range   SURGICAL PATHOLOGY      Surgical Pathology CASE: [id number] PATIENT: Mike Gip Surgical Pathology Report     Clinical History: Panniculitis, acute abdominal pain (crm)     FINAL MICROSCOPIC DIAGNOSIS:  A. PANNICULUS, PANNICULECTOMY: -  Thickened subcutaneous fat pad, without identified inflammation in sampled section. -  Skin with no specific pathologic diagnosis.    GROSS DESCRIPTION:  Received fresh is a 5741 g, 81 x 29 x 5 cm portion of skin and subcutaneous adipose tissue.  There are no discrete masses.  A section is submitted.  [facility] 06/09/2021)    Final Diagnosis performed by Maryan Puls, MD.   Electronically signed 06/10/2021 Technical component performed at [place] [address], [place], [place] [address].  Professional component performed at [employer], [place] [address], [place], [place] [address].  Immunohistochemistry Technical component (if applicable) was performed at [facility].  [place], [address], [place], [place] [address].   IMMUNOHISTOCHEMISTRY DISCLAIMER (if applicable): Some of these immunohistochemical stains may have been developed and the performance characteristics determine by [facility]. Some may not have been cleared or approved by the U.S. Food and Drug Administration. The FDA has determined that such clearance or approval is not necessary. This test is used for clinical purposes. It should not be regarded as investigational or for research. This laboratory is certified under the [employer] (CLIA-88) as qualified to perform high complexity clinical laboratory testing.  The controls stained appropriately.   Creatinine, serum     Status: None   Collection Time: 06/09/21  8:16 PM  Result Value Ref Range   Creatinine, Ser 0.80 0.44 - 1.00 mg/dL   GFR, Estimated >60 >60 mL/min    Comment: (NOTE) Calculated using the CKD-EPI Creatinine Equation (2021) Performed at [facility], [place] [address] [place], [place] [address]   CBC     Status: Abnormal   Collection Time: 06/09/21  8:48 PM  Result Value Ref Range   WBC 19.7 (H) 4.0 - 10.5 K/uL   RBC 4.96 3.87 - 5.11 MIL/uL   Hemoglobin 13.3 12.0 - 15.0 g/dL   HCT 43.6 36.0 - 46.0 %   MCV 87.9 80.0 - 100.0 fL   MCH 26.8 26.0 - 34.0 pg   MCHC 30.5 30.0 - 36.0 g/dL   RDW 14.3 11.5 - 15.5 %   Platelets 437 (H) 150 - 400 K/uL   nRBC 0.0 0.0 - 0.2 %  Comment: Performed at [facility], [place] [address], [place], [place] [address]  Basic metabolic panel     Status: Abnormal   Collection Time: 06/10/21  3:26 AM  Result Value Ref Range   Sodium 137 135 - 145 mmol/L   Potassium 4.0 3.5 - 5.1 mmol/L   Chloride 105 98 - 111 mmol/L   CO2 23 22 - 32 mmol/L   Glucose, Bld 183 (H) 70 - 99 mg/dL    Comment: Glucose reference range applies only to samples taken after fasting for at least 8 hours.   BUN 8 6 - 20 mg/dL    Creatinine, Ser 0.73 0.44 - 1.00 mg/dL   Calcium 9.2 8.9 - 10.3 mg/dL   GFR, Estimated >60 >60 mL/min    Comment: (NOTE) Calculated using the CKD-EPI Creatinine Equation (2021)    Anion gap 9 5 - 15    Comment: Performed at [facility], [place] [address], [place], [place] [address]  CBC     Status: Abnormal   Collection Time: 06/10/21  3:26 AM  Result Value Ref Range   WBC 17.6 (H) 4.0 - 10.5 K/uL   RBC 4.54 3.87 - 5.11 MIL/uL   Hemoglobin 12.1 12.0 - 15.0 g/dL   HCT 40.2 36.0 - 46.0 %   MCV 88.5 80.0 - 100.0 fL   MCH 26.7 26.0 - 34.0 pg   MCHC 30.1 30.0 - 36.0 g/dL   RDW 14.3 11.5 - 15.5 %   Platelets 408 (H) 150 - 400 K/uL   nRBC 0.0 0.0 - 0.2 %    Comment: Performed at [facility], [place] [address] [place], [place] [address]   *Note: Due to a large number of results and/or encounters for the requested time period, some results have not been displayed. A complete set of results can be found in Results Review.    Radiology/Results: No results found.  Anti-infectives: Anti-infectives (From admission, onward)    Start     Dose/Rate Route Frequency Ordered Stop   06/09/21 2200  cefoTEtan (CEFOTAN) 2 g in sodium chloride 0.9 % 100 mL IVPB        2 g 200 mL/hr over 30 Minutes Intravenous Every 12 hours 06/09/21 1844 06/09/21 2253   06/09/21 1115  cefoTEtan (CEFOTAN) 2 g in sodium chloride 0.9 % 100 mL IVPB        2 g 200 mL/hr over 30 Minutes Intravenous On call to O.R. 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   History of pulmonary embolism 05/25/2017   Esophageal dysphagia    Family history of colon cancer    Macromastia 02/27/2015   Chronic neck and back pain 02/27/2015   Medication management 08/22/2014   Chronic anticoagulation 05/17/2014   Degenerative arthritis of left knee 10/06/2013   Status post total knee replacement 10/06/2013   Chronic pain 06/13/2013   Lap Sleeve Gastrectomy May 2014 09/16/2012   Gastrointestinal stromal tumor (GIST)  of stomach ([place]) 02/17/2012   Nephrolithiasis 02/20/2011   Iron deficiency anemia 06/27/2010   Anxiety 06/27/2010   Migraine 09/12/2009   Morbid obesity-BMI 62 10/30/2008   Major depressive disorder, recurrent episode ([place]) 07/01/2006   HYPERTENSION, BENIGN SYSTEMIC 07/01/2006   Gastroesophageal reflux disease 07/01/2006    Will keep on heparin for one more day and send home to resume Xarelto at a date that I haven't determined yet.   2 Days Post-Op    LOS: 2 days   Matt B. Hassell Done, MD, [facility] [phone] to reach the surgeon on call.    06/11/2021 1:34 PM

## 2021-06-12 MED ORDER — OXYCODONE HCL 5 MG PO TABS: 5.0000 mg | ORAL_TABLET | ORAL | 0 refills | Status: DC | PRN

## 2021-06-12 MED ORDER — PANTOPRAZOLE SODIUM 40 MG IV SOLR: 40.0000 mg | Freq: Every day | INTRAVENOUS | Status: DC

## 2021-06-12 NOTE — Discharge Summary (Signed)
Physician Discharge Summary  Patient ID: Crystal Garza MRN: [id number] DOB/AGE: 04-Feb-1971 51 y.o.  PCP: Dickie La, MD  Admit date: 06/09/2021 Discharge date: 06/12/2021  Admission Diagnoses:  abdominal panniculitis  Discharge Diagnoses:  same   Principal Problem:   S/P abdominal  panniculectomy   Surgery:  abdominal panniculectomy  Discharged Condition: improved  Hospital Course:   had surgery.  Kept in hospital on heparin to delay xaralto which she takes for PE risk.  Able to be dsicharged on 2/9  Consults: none  Significant Diagnostic Studies: none    Discharge Exam: Blood pressure (!) 127/55, pulse 78, temperature 98.3 F (36.8 C), temperature source Oral, resp. rate 17, height 5\' 7"  (1.702 m), weight 123.4 kg, SpO2 98 %. Incision with Provena in place  Disposition: Discharge disposition: 01-Home or Self Care       Discharge Instructions     Call MD for:  redness, tenderness, or signs of infection (pain, swelling, redness, odor or green/yellow discharge around incision site)   Complete by: As directed    Diet - low sodium heart healthy   Complete by: As directed    Discharge instructions   Complete by: As directed    Leave Provena portable suction on until the battery runs out then remove and discard Take Xarelto 5 mg on Friday, Saturday, and Sunday and resume Xarelto 10 mg on Monday   Increase activity slowly   Complete by: As directed       Allergies as of 06/12/2021       Reactions   Codeine Rash   Tuna [fish Allergy] Rash        Medication List     STOP taking these medications    Xarelto 10 MG Tabs tablet Generic drug: rivaroxaban       TAKE these medications    ALPRAZolam 1 MG tablet Commonly known as: XANAX TAKE ONE TABLET BY MOUTH EVERY MORNING TAKE ONE TABLET MIDDAY AND TAKE TWO TABLETS AT BEDTIME AS NEEDED FOR ANXIETY   Dexilant 60 MG capsule Generic drug: dexlansoprazole TAKE ONE CAPSULE DAILY.   dicyclomine 10 MG  capsule Commonly known as: BENTYL Take 1 capsule (10 mg total) by mouth every 8 (eight) hours as needed for spasms.   DULoxetine 20 MG capsule Commonly known as: Cymbalta Take 1 capsule (20 mg total) by mouth daily.   famotidine 20 MG tablet Commonly known as: PEPCID Take 1 tablet (20 mg total) by mouth 2 (two) times daily.   Linzess 145 MCG Caps capsule Generic drug: linaclotide TAKE ONE CAPSULE BY MOUTH ONCE DAILY daily BEFORE breakfast.   lisinopril 10 MG tablet Commonly known as: ZESTRIL TAKE 1 TABLET EACH DAY.   ondansetron 4 MG disintegrating tablet Commonly known as: ZOFRAN-ODT Take 1 tablet (4 mg total) by mouth 2 (two) times daily as needed for nausea or vomiting.   oxyCODONE 5 MG immediate release tablet Commonly known as: Oxy IR/ROXICODONE Take 1-2 tablets (5-10 mg total) by mouth every 4 (four) hours as needed for moderate pain.   oxyCODONE-acetaminophen 10-325 MG tablet Commonly known as: PERCOCET TAKE ONE TABLET BY MOUTH EVERY 6 HOURS AS NEEDED CHRONIC PAIN   solifenacin 5 MG tablet Commonly known as: VESICARE Take 5 mg by mouth daily.        Follow-up Information     Johnathan Hausen, MD Follow up in 2 week(s).   Specialty: General Surgery Why: For suture removal-helf of sutures in 14 days Contact information: [place]  [address] [place] [address] [phone]                 Signed: [name] 06/12/2021, 8:24 AM

## 2021-06-12 NOTE — Discharge Instructions (Signed)
One half of sutures to be removed in 14 days.

## 2021-06-12 NOTE — Plan of Care (Signed)
Pt ready to DC home with family. 

## 2021-06-20 NOTE — Progress Notes (Addendum)
Office Visit Note  Patient: Crystal Garza             Date of Birth: 09-08-1970           MRN: [id number]             PCP: Dickie La, MD Referring: Dickie La, MD Visit Date: 06/24/2021 Occupation: @GUAROCC @  Subjective:  Pain in multiple joints and elevated sedimentation rate.  History of Present Illness: Crystal Garza is a 51 y.o. female seen in consultation per request of her PCP.  According to the patient she has had joint aches and pains for many years.  She states her knee joints started hurting when she was 11.  For the last 10 years she has been experiencing increased pain in her neck, lower back, bilateral hands, bilateral knees and her feet.  She states she has seen a podiatrist in the past who told her that she has flatfeet and bone spurs.  She also was evaluated by rheumatologist in [facility] in 2014 where her work-up for autoimmune disease per patient was negative.  She states she was tested for rheumatoid arthritis and lupus.  She states her knee joint symptoms got worse over time and she has been under care of Dr. Ninfa Linden.  She underwent left total knee replacement in 2015 which is doing well.  After her knee replacement she developed DVT and pulmonary embolism.  She has been on anticoagulation since then.  She has some discomfort in her right knee joint and she has been told that she has osteoarthritis.  She had right carpal tunnel release by Dr. Ninfa Linden in the past she also has symptoms of carpal tunnel syndrome in her left hand.  She has disc disease of cervical and lumbar spine for which she sees Dr. Ernestina Patches and gets injections.  She continues to have pain and discomfort in all of her joints.  She was also diagnosed with chronic pain syndrome in the past.  She states in 2015 she had to 9 developed anaphylactic reaction.  Since then she has been under care of an allergist.  She had allergy testing and was told that she is allergic to tuna.  In 2022 she started having  increased itching after eating pineapple and was seen by allergist again.  She has been having consistently high sedimentation rate for that reason she was referred to me.  She states she continues to have discomfort in her neck, lower back, her hands knees and her feet.  There is history of rheumatoid arthritis in her mother.  She is gravida 4, para 3, abortions 1.  Activities of Daily Living:  Patient reports morning stiffness for 20 minutes.   Patient Reports nocturnal pain.  Difficulty dressing/grooming: Denies Difficulty climbing stairs: Reports Difficulty getting out of chair: Denies Difficulty using hands for taps, buttons, cutlery, and/or writing: Reports  Review of Systems  Constitutional:  Positive for fatigue.  HENT:  Negative for mouth sores, mouth dryness and nose dryness.   Eyes:  Negative for pain, itching and dryness.  Respiratory:  Negative for shortness of breath and difficulty breathing.   Cardiovascular:  Negative for chest pain and palpitations.  Gastrointestinal:  Negative for blood in stool, constipation and diarrhea.  Endocrine: Negative for increased urination.  Genitourinary:  Negative for difficulty urinating.  Musculoskeletal:  Positive for joint pain, joint pain, joint swelling, myalgias, morning stiffness, muscle tenderness and myalgias.  Skin:  Negative for color change, rash, redness and sensitivity  to sunlight.  Allergic/Immunologic: Negative for susceptible to infections.  Neurological:  Positive for numbness and headaches. Negative for dizziness, memory loss and weakness.  Hematological:  Positive for bruising/bleeding tendency. Negative for swollen glands.  Psychiatric/Behavioral:  Positive for depressed mood and sleep disturbance. Negative for confusion. The patient is nervous/anxious.    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   History of pulmonary embolism 05/25/2017   Esophageal dysphagia    Family history of colon cancer    Macromastia 02/27/2015   Chronic neck and back pain 02/27/2015   Medication management 08/22/2014   Chronic anticoagulation 05/17/2014   Degenerative arthritis of left knee 10/06/2013   Status post total knee replacement 10/06/2013   Chronic pain 06/13/2013   Lap Sleeve Gastrectomy May 2014 09/16/2012   Gastrointestinal stromal tumor (GIST) of stomach ([place]) 02/17/2012   Nephrolithiasis 02/20/2011   Iron deficiency anemia 06/27/2010   Anxiety 06/27/2010   Migraine 09/12/2009   Morbid obesity-BMI 62 10/30/2008   Major depressive disorder, recurrent episode ([place]) 07/01/2006   HYPERTENSION, BENIGN SYSTEMIC 07/01/2006   Gastroesophageal reflux disease 07/01/2006    Past Medical History:  Diagnosis Date   Anemia    iron def   Anginal pain ([place])    Anxiety    Arthritis    left knee    Back pain    Benign gastrointestinal stromal tumor (GIST)    Chronic knee pain    Depression    DVT (deep venous thrombosis) (H[facility])    Esophageal dysmotility    GERD (gastroesophageal reflux disease)    Hiatal hernia    History of kidney stones    Hx of laparoscopic gastric banding    Hypertension    Internal hemorrhoids    Internal hemorrhoids    Migraine    Morbid obesity ([place])    PE (pulmonary embolism)     Family History  Problem Relation Age of Onset   Diabetes Mother    Diabetes Father    Heart disease Father    Colon cancer Father        brain tumor   Colon polyps Father    Colitis Father    Irritable bowel syndrome  Father    Fibromyalgia Sister    Hypertension Sister    Diabetes Maternal Aunt    Diabetes Maternal Uncle    Diabetes Paternal Aunt    Heart disease Paternal Uncle    Diabetes Paternal Uncle    Diabetes Maternal Grandmother    Diabetes Maternal Grandfather    Schizophrenia Maternal Grandfather    Heart disease Paternal Grandmother    Heart disease Paternal Grandfather    Cancer Other    Hypertension Other    Healthy Son    Healthy Son    Diabetes Daughter        PRE-DIABETES   Esophageal cancer Neg Hx    Past Surgical History:  Procedure Laterality Date   14 HOUR [place] STUDY N/A 05/13/2016   Procedure: 24 HOUR [place] STUDY;  Surgeon: Renelda Loma Armbruster,  MD;  Location: [facility];  Service: Gastroenterology;  Laterality: N/A;   BIOPSY  02/16/2012   Procedure: BIOPSY;  Surgeon: [name], MD;  Location: [facility];  Service: General;;  biopsy of mass x 2   BREATH TEK H PYLORI  07/28/2011   Procedure: BREATH TEK H PYLORI;  Surgeon: [name], MD;  Location: [facility];  Service: General;  Laterality: N/A;  to be done at [place]  05/1991   COLONOSCOPY WITH PROPOFOL N/A 03/03/2016   Procedure: COLONOSCOPY WITH PROPOFOL;  Surgeon: Manus Gunning, MD;  Location: [facility];  Service: Gastroenterology;  Laterality: N/A;   ESOPHAGEAL MANOMETRY N/A 05/13/2016   Procedure: ESOPHAGEAL MANOMETRY (EM);  Surgeon: Manus Gunning, MD;  Location: [facility];  Service: Gastroenterology;  Laterality: N/A;   ESOPHAGOGASTRODUODENOSCOPY N/A 08/29/2012   Procedure: ESOPHAGOGASTRODUODENOSCOPY (EGD);  Surgeon: [name], MD;  [facility]: [facility];  Service: General;  Laterality: N/A;   ESOPHAGOGASTRODUODENOSCOPY N/A 09/04/2014   Procedure: ESOPHAGOGASTRODUODENOSCOPY (EGD);  Surgeon: Alphonsa Overall, MD;  Location: [facility];  Service: General;  Laterality: N/A;   ESOPHAGOGASTRODUODENOSCOPY (EGD) WITH PROPOFOL N/A 03/03/2016   Procedure:  ESOPHAGOGASTRODUODENOSCOPY (EGD) WITH PROPOFOL;  Surgeon: Manus Gunning, MD;  [facility]: [facility];  Service: Gastroenterology;  Laterality: N/A;   EUS  03/03/2012   Procedure: UPPER ENDOSCOPIC ULTRASOUND (EUS) LINEAR;  Surgeon: Milus Banister, MD;  Location: [facility];  Service: Endoscopy;  Laterality: N/A;   GASTRIC ROUX-EN-Y N/A 12/07/2017   Procedure: LAPAROSCOPIC ROUX-EN-Y GASTRIC BYPASS WITH LYSIS OF ADHESIONS AND UPPER ENDOSCOPY ERAS Pathway;  Surgeon: Johnathan Hausen, MD;  Location: [facility];  Service: General;  Laterality: N/A;   gist     KNEE ARTHROSCOPY     LAPAROSCOPIC GASTRECTOMY  04/08/2012   Procedure: LAPAROSCOPIC GASTRECTOMY;  Surgeon: [name], MD;  Location: [facility];  Service: General;;  removal of GIST tumor of stomach   LAPAROSCOPIC GASTRIC SLEEVE RESECTION N/A 08/29/2012   Procedure: LAPAROSCOPIC GASTRIC SLEEVE RESECTION;  Surgeon: [name], MD;  Location: [facility];  Service: General;  Laterality: N/A;  Sleeve Gastrectomy   LEFT HEART CATH AND CORONARY ANGIOGRAPHY N/A 05/28/2017   Procedure: LEFT HEART CATH AND CORONARY ANGIOGRAPHY;  Surgeon: Leonie Man, MD;  Location: [facility];  Service: Cardiovascular;  Laterality: N/A;   PANNICULECTOMY N/A 06/09/2021   Procedure: PANNICULECTOMY;  Surgeon: Johnathan Hausen, MD;  Location: [facility];  Service: General;  Laterality: N/A;   ROUX-EN-Y GASTRIC BYPASS     TOTAL ABDOMINAL HYSTERECTOMY  04/2004   TOTAL KNEE ARTHROPLASTY Left 10/06/2013   Procedure: LEFT TOTAL KNEE ARTHROPLASTY;  Surgeon: Mcarthur Rossetti, MD;  Location: [facility];  Service: Orthopedics;  Laterality: Left;   TUBAL LIGATION     Social History   Social History Narrative   Patient has been on disability since 2004 for knee pain, back pain, depression and acid reflux. She does work part-time now as [employer] manager.      She is caring for her granddaughter Amia age 30 (has sickle cell, goes to [facility] every month and has had multiple  hospitalizations).       Patient enjoys going on walks, going to the beach, and cooking.    Immunization History  Administered Date(s) Administered   Influenza Split 06/01/2011, 02/17/2012, 01/17/2013   Influenza Whole 03/18/2009   Influenza,inj,Quad PF,6+ Mos 01/03/2014, 02/06/2015, 02/19/2016, 02/24/2017, 05/05/2018, 03/19/2021   Influenza-Unspecified 09/22/2017, 12/03/2017   PFIZER Comirnaty(Gray Top)Covid-19 Tri-Sucrose Vaccine 09/11/2020  PFIZER(Purple Top)SARS-COV-2 Vaccination 03/11/2020, 04/01/2020   PPD Test 09/22/2010, 09/11/2011, 10/17/2012, 11/06/2013, 10/31/2014, 10/07/2015, 11/12/2017, 03/21/2020, 03/19/2021     Objective: Vital Signs: BP 104/70 (BP Location: Left Arm, Patient Position: Sitting, Cuff Size: Large)    Pulse 72    Ht _0  (1.702 m)    Wt 269 lb (122 kg)    BMI 42.13 kg/m    Physical Exam Vitals and nursing note reviewed.  Constitutional:      Appearance: She is well-developed.  HENT:     Head: Normocephalic and atraumatic.  Eyes:     Conjunctiva/sclera: Conjunctivae normal.  Cardiovascular:     Rate and Rhythm: Normal rate and regular rhythm.     Heart sounds: Normal heart sounds.  Pulmonary:     Effort: Pulmonary effort is normal.     Breath sounds: Normal breath sounds.  Abdominal:     General: Bowel sounds are normal.     Palpations: Abdomen is soft.  Musculoskeletal:     Cervical back: Normal range of motion.  Lymphadenopathy:     Cervical: No cervical adenopathy.  Skin:    General: Skin is warm and dry.     Capillary Refill: Capillary refill takes less than 2 seconds.  Neurological:     Mental Status: She is alert and oriented to person, place, and time.  Psychiatric:        Behavior: Behavior normal.     Musculoskeletal Exam: She had good range of motion of the cervical spine with some discomfort.  She has thoracic kyphosis with no point tenderness.  She had discomfort range of motion of her lumbar spine.  There was no SI joint  tenderness.  Shoulder joints, elbow joints, wrist joints with good range of motion.  She had no tenderness over MCPs PIPs or DIPs.  No synovitis was noted.  She had good range of motion of her hip joints.  Left knee joint is replaced.  Right knee joint was in good range of motion without any warmth swelling or effusion.  Bilateral pes planus was noted.  No synovitis was noted over MTPs or PIPs.  CDAI Exam: CDAI Score: -- Patient Global: --; Provider Global: -- Swollen: --; Tender: -- Joint Exam 06/24/2021   No joint exam has been documented for this visit   There is currently no information documented on the homunculus. Go to the Rheumatology activity and complete the homunculus joint exam.  Investigation: No additional findings.  Imaging: XR Foot 2 Views Left  Result Date: 06/24/2021 First MTP, PIP and DIP narrowing was noted.  Calcification was noted on the third metatarsal shaft.  No intertarsal, tibiotalar or subtalar joint space narrowing was noted.  Inferior calcaneal spur was noted. Impression: These findings are consistent with osteoarthritis of the foot.  XR Foot 2 Views Right  Result Date: 06/24/2021 First MTP, PIP and DIP narrowing was noted.  Old callus formation was noted in the third metatarsal shaft.  No intertarsal, tibiotalar or subtalar joint space narrowing was noted.  Inferior calcaneal spur was noted. Impression: These findings are consistent with osteoarthritis of the foot.  XR Hand 2 View Left  Result Date: 06/24/2021 Mild PIP narrowing was noted.  No MCP, DIP, intercarpal or radiocarpal joint space narrowing was noted.  No erosive changes were noted. Impression: These findings are consistent with early osteoarthritis of the hand.  XR Hand 2 View Right  Result Date: 06/24/2021 Mild PIP narrowing was noted.  No MCP, DIP, intercarpal or radiocarpal joint space  narrowing was noted.  No erosive changes were noted. Impression: These findings are consistent with early  osteoarthritis of the hand.   Recent Labs: Lab Results  Component Value Date   WBC 17.6 (H) 06/10/2021   HGB 12.1 06/10/2021   PLT 408 (H) 06/10/2021   NA 137 06/10/2021   K 4.0 06/10/2021   CL 105 06/10/2021   CO2 23 06/10/2021   GLUCOSE 183 (H) 06/10/2021   BUN 8 06/10/2021   CREATININE 0.73 06/10/2021   BILITOT 0.5 04/04/2021   ALKPHOS 86 04/04/2021   AST 16 04/04/2021   ALT 8 04/04/2021   PROT 6.9 04/04/2021   ALBUMIN 3.8 04/04/2021   CALCIUM 9.2 06/10/2021   GFRAA >60 02/04/2018    Speciality Comments: No specialty comments available.  Procedures:  No procedures performed Allergies: Codeine and Tuna [fish allergy]   Assessment / Plan:     Visit Diagnoses: Polyarthralgia-patient complains of pain and discomfort in multiple joints for many years.  No synovitis was noted on the examination today.  Pain in both hands -she complains of pain and discomfort in her bilateral hands.  She had good range of motion of her wrist joints, MCPs PIPs and DIPs with no synovitis.  Plan: XR Hand 2 View Right, XR Hand 2 View Left, x-rays showed mild PIP narrowing otherwise unremarkable.  Rheumatoid factor, Cyclic citrul peptide antibody, IgG, Uric acid, ANA  Status post total left knee replacement - 2015 by Dr. Ninfa Linden.  She has been followed by Dr. Ninfa Linden and has been doing well.  Patient states that she suffered from knee joint discomfort since she was 51 years old.  She has been also told that she has osteoarthritis in her right knee joint.  No warmth swelling or effusion was noted.  Pain in both feet -she complains of pain and discomfort in her bilateral feet.  No synovitis was noted.  She has been seen by a podiatrist in the past and has been told that she has pes planus.  Pes planus was noted.  Plan: XR Foot 2 Views Right, XR Foot 2 Views Left.  X-rays were consistent with osteoarthritis.  Calcification was noted over bilateral third metatarsals.  Elevated sed rate - 04/04/21: ESR 84,  CRP 6.  No synovitis was noted on the examination today.  She had recent abdominal surgery.  I would not repeat sedimentation rate.  The elevated sedimentation rate could be related to the tumor.  DDD (degenerative disc disease), cervical -I reviewed MRI from March 08, 2019 showed multilevel spondylosis, disc protrusion and mild spinal stenosis.  She has been followed by Dr. Ernestina Patches.  Arthropathy of thoracic facet joint-I reviewed x-rays of the thoracic spine from May 31, 1998 2020 which showed facet joint arthropathy.  Chronic pain syndrome-she has been diagnosed with chronic pain syndrome.  She continues to have generalized pain and discomfort.  She had multiple tender points and generalized hyperalgesia consistent with fibromyalgia syndrome.  Primary insomnia-she gives history of chronic insomnia which could be contributing to chronic pain syndrome.  Other fatigue -she suffers from chronic fatigue probably related to chronic insomnia.  Plan: Serum protein electrophoresis with reflex  History of pulmonary embolism - 2015.  According to patient she developed pulmonary embolism after the knee joint replacement.  She was on anticoagulation for few months.  Essential hypertension-blood pressure is normal today.  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  She is still recovering from the recent surgery.  Nephrolithiasis  Hx of migraines  Anxiety and depression-she is on Cymbalta.  Family history of rheumatoid arthritis - Mother  Family history of fibromyalgia - Mother and sister  Orders: Orders Placed This Encounter  Procedures   XR Hand 2 View Right   XR Hand 2 View Left   XR Foot 2 Views Right   XR Foot 2 Views Left   Rheumatoid factor    Cyclic citrul peptide antibody, IgG   Uric acid   Serum protein electrophoresis with reflex   ANA   No orders of the defined types were placed in this encounter.    Follow-Up Instructions: Return for Pain in multiple joints and elevated sedimentation rate.   Bo Merino, MD  Note - This record has been created using Editor, commissioning.  Chart creation errors have been sought, but may not always  have been located. Such creation errors do not reflect on  the standard of medical care.

## 2021-06-24 ENCOUNTER — Ambulatory Visit: Payer: Self-pay

## 2021-06-24 ENCOUNTER — Ambulatory Visit (INDEPENDENT_AMBULATORY_CARE_PROVIDER_SITE_OTHER): Payer: BLUE CROSS/BLUE SHIELD | Admitting: Rheumatology

## 2021-06-24 ENCOUNTER — Other Ambulatory Visit: Payer: Self-pay

## 2021-06-24 ENCOUNTER — Encounter: Payer: Self-pay | Admitting: Rheumatology

## 2021-06-24 ENCOUNTER — Other Ambulatory Visit: Payer: Self-pay | Admitting: Family Medicine

## 2021-06-24 VITALS — BP 104/70 | HR 72 | Ht 67.0 in | Wt 269.0 lb

## 2021-06-24 DIAGNOSIS — Z7901 Long term (current) use of anticoagulants: Secondary | ICD-10-CM

## 2021-06-24 DIAGNOSIS — M79671 Pain in right foot: Secondary | ICD-10-CM

## 2021-06-24 DIAGNOSIS — K219 Gastro-esophageal reflux disease without esophagitis: Secondary | ICD-10-CM

## 2021-06-24 DIAGNOSIS — Z86711 Personal history of pulmonary embolism: Secondary | ICD-10-CM | POA: Diagnosis not present

## 2021-06-24 DIAGNOSIS — M2550 Pain in unspecified joint: Secondary | ICD-10-CM

## 2021-06-24 DIAGNOSIS — G894 Chronic pain syndrome: Secondary | ICD-10-CM | POA: Diagnosis not present

## 2021-06-24 DIAGNOSIS — R5383 Other fatigue: Secondary | ICD-10-CM

## 2021-06-24 DIAGNOSIS — M5030 Other cervical disc degeneration, unspecified cervical region: Secondary | ICD-10-CM

## 2021-06-24 DIAGNOSIS — Z8669 Personal history of other diseases of the nervous system and sense organs: Secondary | ICD-10-CM

## 2021-06-24 DIAGNOSIS — N200 Calculus of kidney: Secondary | ICD-10-CM

## 2021-06-24 DIAGNOSIS — I10 Essential (primary) hypertension: Secondary | ICD-10-CM | POA: Diagnosis not present

## 2021-06-24 DIAGNOSIS — Z9889 Other specified postprocedural states: Secondary | ICD-10-CM

## 2021-06-24 DIAGNOSIS — Z8269 Family history of other diseases of the musculoskeletal system and connective tissue: Secondary | ICD-10-CM

## 2021-06-24 DIAGNOSIS — Z9884 Bariatric surgery status: Secondary | ICD-10-CM

## 2021-06-24 DIAGNOSIS — M79672 Pain in left foot: Secondary | ICD-10-CM

## 2021-06-24 DIAGNOSIS — M79642 Pain in left hand: Secondary | ICD-10-CM

## 2021-06-24 DIAGNOSIS — Z8261 Family history of arthritis: Secondary | ICD-10-CM

## 2021-06-24 DIAGNOSIS — F5101 Primary insomnia: Secondary | ICD-10-CM | POA: Diagnosis not present

## 2021-06-24 DIAGNOSIS — R700 Elevated erythrocyte sedimentation rate: Secondary | ICD-10-CM

## 2021-06-24 DIAGNOSIS — F419 Anxiety disorder, unspecified: Secondary | ICD-10-CM

## 2021-06-24 DIAGNOSIS — M79641 Pain in right hand: Secondary | ICD-10-CM | POA: Diagnosis not present

## 2021-06-24 DIAGNOSIS — M47814 Spondylosis without myelopathy or radiculopathy, thoracic region: Secondary | ICD-10-CM

## 2021-06-24 DIAGNOSIS — C49A2 Gastrointestinal stromal tumor of stomach: Secondary | ICD-10-CM

## 2021-06-24 DIAGNOSIS — M5412 Radiculopathy, cervical region: Secondary | ICD-10-CM

## 2021-06-24 DIAGNOSIS — Z96652 Presence of left artificial knee joint: Secondary | ICD-10-CM | POA: Diagnosis not present

## 2021-06-24 DIAGNOSIS — F32A Depression, unspecified: Secondary | ICD-10-CM

## 2021-06-24 DIAGNOSIS — R1319 Other dysphagia: Secondary | ICD-10-CM

## 2021-06-25 ENCOUNTER — Encounter: Payer: Self-pay | Admitting: Family Medicine

## 2021-06-25 ENCOUNTER — Other Ambulatory Visit: Payer: Self-pay | Admitting: Family Medicine

## 2021-06-26 ENCOUNTER — Ambulatory Visit: Payer: Medicare Other | Admitting: Rheumatology

## 2021-06-26 ENCOUNTER — Encounter: Payer: Self-pay | Admitting: Rheumatology

## 2021-06-26 LAB — PROTEIN ELECTROPHORESIS, SERUM, WITH REFLEX
Albumin ELP: 3.2 g/dL — ABNORMAL LOW (ref 3.8–4.8)
Alpha 1: 0.4 g/dL — ABNORMAL HIGH (ref 0.2–0.3)
Alpha 2: 1.1 g/dL — ABNORMAL HIGH (ref 0.5–0.9)
Beta 2: 0.6 g/dL — ABNORMAL HIGH (ref 0.2–0.5)
Beta Globulin: 0.5 g/dL (ref 0.4–0.6)
Gamma Globulin: 1.2 g/dL (ref 0.8–1.7)
Total Protein: 6.9 g/dL (ref 6.1–8.1)

## 2021-06-26 LAB — CYCLIC CITRUL PEPTIDE ANTIBODY, IGG: Cyclic Citrullin Peptide Ab: <16 UNITS

## 2021-06-26 LAB — ANA: Anti Nuclear Antibody (ANA): NEGATIVE

## 2021-06-26 LAB — URIC ACID: Uric Acid, Serum: 4.4 mg/dL (ref 2.5–7.0)

## 2021-06-26 LAB — RHEUMATOID FACTOR: Rheumatoid fact SerPl-aCnc: <14 IU/mL (ref <14)

## 2021-06-26 NOTE — Progress Notes (Signed)
All the labs are within normal limits.  We will discuss results at the follow-up visit.

## 2021-07-11 NOTE — Progress Notes (Signed)
Office Visit Note  Patient: Crystal Garza             Date of Birth: [birth year]           MRN: [id number]             PCP: Nestor Ramp, MD Referring: Nestor Ramp, MD Visit Date: 07/24/2021 Occupation: @GUAROCC @  Subjective:  Pain in multiple joints and muscles  History of Present Illness: Crystal Garza is a 51 y.o. female with history of osteoarthritis and fibromyalgia syndrome.  She states she continues to have pain and discomfort in all of her joints.  She describes discomfort in her cervical spine, upper and lower back.  She also continues to have discomfort in her bilateral hands, bilateral knee joints and her feet.  She is in constant discomfort.  She states all her muscles are achy.  She has not noticed any joint swelling.  She is also concerned about her elevated sedimentation rate and would like to have it repeated now as its been more than a month since her surgery.  Activities of Daily Living:  Patient reports morning stiffness for 30 minutes.   Patient Reports nocturnal pain.  Difficulty dressing/grooming: Denies Difficulty climbing stairs: Reports Difficulty getting out of chair: Denies Difficulty using hands for taps, buttons, cutlery, and/or writing: Reports  Review of Systems  Constitutional:  Positive for fatigue.  HENT:  Negative for mouth sores, mouth dryness and nose dryness.   Eyes:  Negative for pain, itching and dryness.  Respiratory:  Negative for shortness of breath and difficulty breathing.   Cardiovascular:  Negative for chest pain and palpitations.  Gastrointestinal:  Negative for blood in stool, constipation and diarrhea.  Endocrine: Negative for increased urination.  Genitourinary:  Negative for difficulty urinating.  Musculoskeletal:  Positive for joint pain, joint pain, joint swelling, myalgias, morning stiffness, muscle tenderness and myalgias.  Skin:  Negative for color change, rash and redness.  Allergic/Immunologic: Negative for susceptible  to infections.  Neurological:  Positive for numbness and headaches. Negative for dizziness, memory loss and weakness.  Hematological:  Positive for bruising/bleeding tendency.  Psychiatric/Behavioral:  Negative for confusion.    PMFS History:  Patient Active Problem List   Diagnosis Date Noted   Primary osteoarthritis of both feet 07/24/2021   DDD (degenerative disc disease), cervical 07/24/2021   Arthropathy of thoracic facet joint 07/24/2021   Fibromyalgia 07/24/2021   Primary insomnia 07/24/2021   Other fatigue 07/24/2021   S/P abdominal  panniculectomy 06/09/2021   Pruritus 11/28/2020   Chronic rhinitis 11/28/2020   Rash and other nonspecific skin eruption 11/05/2020   Other adverse food reactions, not elsewhere classified, subsequent encounter 11/05/2020   Hx of food allergy 09/13/2020   Healthcare maintenance 09/13/2020   Constipation due to pain medication 03/06/2020   Cervical radiculopathy at C6 03/29/2019   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   History of pulmonary embolism 05/25/2017   Esophageal dysphagia    Family history of colon cancer    Macromastia 02/27/2015   Chronic neck and back pain 02/27/2015   Medication management 08/22/2014   Chronic anticoagulation 05/17/2014   Degenerative arthritis of left knee 10/06/2013   Status post total knee replacement 10/06/2013   Chronic pain 06/13/2013   Lap Sleeve Gastrectomy May 2014 09/16/2012   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) 02/17/2012   Nephrolithiasis 02/20/2011   Iron deficiency anemia 06/27/2010   Anxiety 06/27/2010   Migraine 09/12/2009  Morbid obesity-BMI 62 10/30/2008   Major depressive disorder, recurrent episode (HCC) 07/01/2006   HYPERTENSION, BENIGN SYSTEMIC 07/01/2006   Gastroesophageal reflux disease 07/01/2006    Past Medical History:  Diagnosis Date   Anemia    iron def   Anginal pain (HCC)    Anxiety    Arthritis    left knee    Back pain     Benign gastrointestinal stromal tumor (GIST)    Chronic knee pain    Depression    DVT (deep venous thrombosis) (HCC)    Esophageal dysmotility    GERD (gastroesophageal reflux disease)    Hiatal hernia    History of kidney stones    Hx of laparoscopic gastric banding    Hypertension    Internal hemorrhoids    Internal hemorrhoids    Migraine    Morbid obesity (HCC)    PE (pulmonary embolism)     Family History  Problem Relation Age of Onset   Diabetes Mother    Diabetes Father    Heart disease Father    Colon cancer Father        brain tumor   Colon polyps Father    Colitis Father    Irritable bowel syndrome Father    Fibromyalgia Sister    Hypertension Sister    Diabetes Maternal Aunt    Diabetes Maternal Uncle    Diabetes Paternal Aunt    Heart disease Paternal Uncle    Diabetes Paternal Uncle    Diabetes Maternal Grandmother    Diabetes Maternal Grandfather    Schizophrenia Maternal Grandfather    Heart disease Paternal Grandmother    Heart disease Paternal Grandfather    Cancer Other    Hypertension Other    Healthy Son    Healthy Son    Diabetes Daughter        PRE-DIABETES   Esophageal cancer Neg Hx    Past Surgical History:  Procedure Laterality Date   82 HOUR PH STUDY N/A 05/13/2016   Procedure: 24 HOUR PH STUDY;  Surgeon: Ruffin Frederick, MD;  Location: [facility];  Service: Gastroenterology;  Laterality: N/A;   BIOPSY  02/16/2012   Procedure: BIOPSY;  Surgeon: Valarie Merino, MD;  Location: [facility];  Service: General;;  biopsy of mass x 2   BREATH TEK H PYLORI  07/28/2011   Procedure: BREATH TEK H PYLORI;  Surgeon: Valarie Merino, MD;  Location: [facility];  Service: General;  Laterality: N/A;  to be done at 745   CESAREAN SECTION  05/1991   COLONOSCOPY WITH PROPOFOL N/A 03/03/2016   Procedure: COLONOSCOPY WITH PROPOFOL;  Surgeon: Ruffin Frederick, MD;  Location: [facility];  Service: Gastroenterology;  Laterality: N/A;    ESOPHAGEAL MANOMETRY N/A 05/13/2016   Procedure: ESOPHAGEAL MANOMETRY (EM);  Surgeon: Ruffin Frederick, MD;  Location: [facility];  Service: Gastroenterology;  Laterality: N/A;   ESOPHAGOGASTRODUODENOSCOPY N/A 08/29/2012   Procedure: ESOPHAGOGASTRODUODENOSCOPY (EGD);  Surgeon: Valarie Merino, MD;  Location: [facility];  Service: General;  Laterality: N/A;   ESOPHAGOGASTRODUODENOSCOPY N/A 09/04/2014   Procedure: ESOPHAGOGASTRODUODENOSCOPY (EGD);  Surgeon: Ovidio Kin, MD;  Location: [facility];  Service: General;  Laterality: N/A;   ESOPHAGOGASTRODUODENOSCOPY (EGD) WITH PROPOFOL N/A 03/03/2016   Procedure: ESOPHAGOGASTRODUODENOSCOPY (EGD) WITH PROPOFOL;  Surgeon: Ruffin Frederick, MD;  Location: [facility];  Service: Gastroenterology;  Laterality: N/A;   EUS  03/03/2012   Procedure: UPPER ENDOSCOPIC ULTRASOUND (EUS) LINEAR;  Surgeon: Rachael Fee, MD;  Location: [facility];  Service: Endoscopy;  Laterality: N/A;   GASTRIC ROUX-EN-Y N/A 12/07/2017   Procedure: LAPAROSCOPIC ROUX-EN-Y GASTRIC BYPASS WITH LYSIS OF ADHESIONS AND UPPER ENDOSCOPY ERAS Pathway;  Surgeon: Luretha Murphy, MD;  Location: [facility];  Service: General;  Laterality: N/A;   gist     KNEE ARTHROSCOPY     LAPAROSCOPIC GASTRECTOMY  04/08/2012   Procedure: LAPAROSCOPIC GASTRECTOMY;  Surgeon: Valarie Merino, MD;  Location: [facility];  Service: General;;  removal of GIST tumor of stomach   LAPAROSCOPIC GASTRIC SLEEVE RESECTION N/A 08/29/2012   Procedure: LAPAROSCOPIC GASTRIC SLEEVE RESECTION;  Surgeon: Valarie Merino, MD;  Location: [facility];  Service: General;  Laterality: N/A;  Sleeve Gastrectomy   LEFT HEART CATH AND CORONARY ANGIOGRAPHY N/A 05/28/2017   Procedure: LEFT HEART CATH AND CORONARY ANGIOGRAPHY;  Surgeon: Marykay Lex, MD;  Location: [facility];  Service: Cardiovascular;  Laterality: N/A;   PANNICULECTOMY N/A 06/09/2021   Procedure: PANNICULECTOMY;  Surgeon: Luretha Murphy, MD;  Location:  [facility];  Service: General;  Laterality: N/A;   ROUX-EN-Y GASTRIC BYPASS     TOTAL ABDOMINAL HYSTERECTOMY  04/2004   TOTAL KNEE ARTHROPLASTY Left 10/06/2013   Procedure: LEFT TOTAL KNEE ARTHROPLASTY;  Surgeon: Kathryne Hitch, MD;  Location: [facility];  Service: Orthopedics;  Laterality: Left;   TUBAL LIGATION     Social History   Social History Narrative   Patient has been on disability since 2004 for knee pain, back pain, depression and acid reflux. She does work part-time now as Lawyer.      She is caring for her granddaughter Amia age 60 (has sickle cell, goes to [facility] every month and has had multiple hospitalizations).       Patient enjoys going on walks, going to the beach, and cooking.    Immunization History  Administered Date(s) Administered   Influenza Split 06/01/2011, 02/17/2012, 01/17/2013   Influenza Whole 03/18/2009   Influenza,inj,Quad PF,6+ Mos 01/03/2014, 02/06/2015, 02/19/2016, 02/24/2017, 05/05/2018, 03/19/2021   Influenza-Unspecified 09/22/2017, 12/03/2017   PFIZER Comirnaty(Gray Top)Covid-19 Tri-Sucrose Vaccine 09/11/2020   PFIZER(Purple Top)SARS-COV-2 Vaccination 03/11/2020, 04/01/2020   PPD Test 09/22/2010, 09/11/2011, 10/17/2012, 11/06/2013, 10/31/2014, 10/07/2015, 11/12/2017, 03/21/2020, 03/19/2021     Objective: Vital Signs: BP 127/84 (BP Location: Left Arm, Patient Position: Sitting, Cuff Size: Large)   Pulse (!) 102   Ht 5\' 7"  (1.702 m)   Wt 267 lb 3.2 oz (121.2 kg)   BMI 41.85 kg/m    Physical Exam Vitals and nursing note reviewed.  Constitutional:      Appearance: She is well-developed.  HENT:     Head: Normocephalic and atraumatic.  Eyes:     Conjunctiva/sclera: Conjunctivae normal.  Cardiovascular:     Rate and Rhythm: Normal rate and regular rhythm.     Heart sounds: Normal heart sounds.  Pulmonary:     Effort: Pulmonary effort is normal.     Breath sounds: Normal breath sounds.  Abdominal:     General: Bowel sounds are normal.      Palpations: Abdomen is soft.  Musculoskeletal:     Cervical back: Normal range of motion.  Lymphadenopathy:     Cervical: No cervical adenopathy.  Skin:    General: Skin is warm and dry.     Capillary Refill: Capillary refill takes less than 2 seconds.  Neurological:     Mental Status: She is alert and oriented to person, place, and time.  Psychiatric:        Behavior: Behavior normal.  Musculoskeletal Exam: C-spine was in good range of motion.  It was difficult to assess her thoracic and lumbar spine range of motion due to body habitus.  She had no SI joint tenderness.  Shoulder joints, elbow joints, wrist joints with good range of motion.  She had no synovitis on my examination over her hands.  She had discomfort over bilateral trochanteric bursa and bilateral trapezius region.  Left knee joint was replaced without any discomfort on range of motion.  She had good range of motion in her right knee joint.  She has some tenderness over the medial aspect of her knee.  She had bilateral pes planus without tenderness over MTPs or PIPs.  CDAI Exam: CDAI Score: -- Patient Global: --; Provider Global: -- Swollen: --; Tender: -- Joint Exam 07/24/2021   No joint exam has been documented for this visit   There is currently no information documented on the homunculus. Go to the Rheumatology activity and complete the homunculus joint exam.  Investigation: No additional findings.  Imaging: No results found.  Recent Labs: Lab Results  Component Value Date   WBC 17.6 (H) 06/10/2021   HGB 12.1 06/10/2021   PLT 408 (H) 06/10/2021   NA 137 06/10/2021   K 4.0 06/10/2021   CL 105 06/10/2021   CO2 23 06/10/2021   GLUCOSE 183 (H) 06/10/2021   BUN 8 06/10/2021   CREATININE 0.73 06/10/2021   BILITOT 0.5 04/04/2021   ALKPHOS 86 04/04/2021   AST 16 04/04/2021   ALT 8 04/04/2021   PROT 6.9 06/24/2021   ALBUMIN 3.8 04/04/2021   CALCIUM 9.2 06/10/2021   GFRAA >60 02/04/2018   June 24, 2021 SPEP negative, RF negative, anti-CCP negative, uric acid 4.4, ANA negative  Speciality Comments: No specialty comments available.  Procedures:  No procedures performed Allergies: Codeine and Tuna [fish allergy]   Assessment / Plan:     Visit Diagnoses: Pain in both hands - X-ray showed bilateral mild PIP narrowing.  All autoimmune work-up was negative.  X-ray findings and lab findings were discussed with patient at length.  She has no clinical features of autoimmune disease.  A handout on hand exercises was given.  Joint protection was discussed.  Status post total left knee replacement - 2015 by Dr. Magnus Ivan.  No warmth swelling or effusion was noted.  Primary osteoarthritis of both feet - History of chronic pain in her feet.  She has seen podiatrist in the past.  She had pes planus.  X-rays were consistent with osteoarthritis.  X-ray findings were discussed with the patient.  Use of proper fitting shoes with arch support were discussed.  Elevated sed rate - Most likely related to recent abdominal surgery.  Her CRP was normal.-It has been over a month since her abdominal surgery.  We will repeat sedimentation rate per patient's request.  Plan: Sedimentation rate  DDD (degenerative disc disease), cervical - Multilevel spondylosis, mild spinal stenosis.  Followed by Dr. Alvester Morin.  She has chronic discomfort.  Arthropathy of thoracic facet joint -she has ongoing pain and discomfort.  I will refer her to physical therapy.  Plan: Ambulatory referral to Physical Therapy  Chronic pain syndrome  Fibromyalgia -she continues to have generalized pain and discomfort.  She has positive tender points and hyperalgesia.  There is a strong family history of fibromyalgia in her family members.  Detailed counsel regarding fibromyalgia was provided.  Need for regular exercise was emphasized.  I will also obtain muscle enzymes today.  I will  refer her to physical therapy.  Plan: CK, Aldolase, Ambulatory  referral to Physical Therapy  Primary insomnia-good sleep hygiene was discussed.  Other fatigue -most likely related to fibromyalgia and insomnia.  Plan: TSH  History of pulmonary embolism - 2015 after the knee replacement.  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    Follow-Up Instructions: Return in about 3 months (around 10/24/2021), or if symptoms worsen or fail to improve, for Osteoarthritis.   Pollyann Savoy, MD  Note - This record has been created using Animal nutritionist.  Chart creation errors have been sought, but may not always  have been located. Such creation errors do not reflect on  the standard of medical care.

## 2021-07-15 ENCOUNTER — Other Ambulatory Visit: Payer: Self-pay | Admitting: Family Medicine

## 2021-07-16 ENCOUNTER — Other Ambulatory Visit: Payer: Self-pay | Admitting: Family Medicine

## 2021-07-16 MED ORDER — OXYCODONE-ACETAMINOPHEN 10-325 MG PO TABS: ORAL_TABLET | ORAL | 0 refills | Status: DC

## 2021-07-24 ENCOUNTER — Encounter: Payer: Self-pay | Admitting: Rheumatology

## 2021-07-24 ENCOUNTER — Ambulatory Visit (INDEPENDENT_AMBULATORY_CARE_PROVIDER_SITE_OTHER): Payer: BLUE CROSS/BLUE SHIELD | Admitting: Rheumatology

## 2021-07-24 ENCOUNTER — Other Ambulatory Visit: Payer: Self-pay

## 2021-07-24 VITALS — BP 127/84 | HR 102 | Ht 67.0 in | Wt 267.2 lb

## 2021-07-24 DIAGNOSIS — I10 Essential (primary) hypertension: Secondary | ICD-10-CM | POA: Diagnosis not present

## 2021-07-24 DIAGNOSIS — M79642 Pain in left hand: Secondary | ICD-10-CM

## 2021-07-24 DIAGNOSIS — M19072 Primary osteoarthritis, left ankle and foot: Secondary | ICD-10-CM

## 2021-07-24 DIAGNOSIS — R700 Elevated erythrocyte sedimentation rate: Secondary | ICD-10-CM | POA: Diagnosis not present

## 2021-07-24 DIAGNOSIS — M797 Fibromyalgia: Secondary | ICD-10-CM

## 2021-07-24 DIAGNOSIS — N200 Calculus of kidney: Secondary | ICD-10-CM

## 2021-07-24 DIAGNOSIS — Z8269 Family history of other diseases of the musculoskeletal system and connective tissue: Secondary | ICD-10-CM

## 2021-07-24 DIAGNOSIS — Z96652 Presence of left artificial knee joint: Secondary | ICD-10-CM | POA: Diagnosis not present

## 2021-07-24 DIAGNOSIS — K219 Gastro-esophageal reflux disease without esophagitis: Secondary | ICD-10-CM

## 2021-07-24 DIAGNOSIS — Z9884 Bariatric surgery status: Secondary | ICD-10-CM

## 2021-07-24 DIAGNOSIS — M47814 Spondylosis without myelopathy or radiculopathy, thoracic region: Secondary | ICD-10-CM | POA: Diagnosis not present

## 2021-07-24 DIAGNOSIS — R5383 Other fatigue: Secondary | ICD-10-CM | POA: Diagnosis not present

## 2021-07-24 DIAGNOSIS — Z86711 Personal history of pulmonary embolism: Secondary | ICD-10-CM | POA: Diagnosis not present

## 2021-07-24 DIAGNOSIS — C49A2 Gastrointestinal stromal tumor of stomach: Secondary | ICD-10-CM

## 2021-07-24 DIAGNOSIS — Z8261 Family history of arthritis: Secondary | ICD-10-CM

## 2021-07-24 DIAGNOSIS — M19071 Primary osteoarthritis, right ankle and foot: Secondary | ICD-10-CM | POA: Diagnosis not present

## 2021-07-24 DIAGNOSIS — M5030 Other cervical disc degeneration, unspecified cervical region: Secondary | ICD-10-CM | POA: Diagnosis not present

## 2021-07-24 DIAGNOSIS — G894 Chronic pain syndrome: Secondary | ICD-10-CM

## 2021-07-24 DIAGNOSIS — R1319 Other dysphagia: Secondary | ICD-10-CM

## 2021-07-24 DIAGNOSIS — F5101 Primary insomnia: Secondary | ICD-10-CM | POA: Diagnosis not present

## 2021-07-24 DIAGNOSIS — Z8669 Personal history of other diseases of the nervous system and sense organs: Secondary | ICD-10-CM

## 2021-07-24 DIAGNOSIS — M79641 Pain in right hand: Secondary | ICD-10-CM

## 2021-07-24 DIAGNOSIS — Z9889 Other specified postprocedural states: Secondary | ICD-10-CM

## 2021-07-24 DIAGNOSIS — F32A Depression, unspecified: Secondary | ICD-10-CM

## 2021-07-24 DIAGNOSIS — F419 Anxiety disorder, unspecified: Secondary | ICD-10-CM

## 2021-07-24 NOTE — Patient Instructions (Signed)
Hand Exercises ?Hand exercises can be helpful for almost anyone. These exercises can strengthen the hands, improve flexibility and movement, and increase blood flow to the hands. These results can make work and daily tasks easier. ?Hand exercises can be especially helpful for people who have joint pain from arthritis or have nerve damage from overuse (carpal tunnel syndrome). ?These exercises can also help people who have injured a hand. ?Exercises ?Most of these hand exercises are gentle stretching and motion exercises. It is usually safe to do them often throughout the day. Warming up your hands before exercise may help to reduce stiffness. You can do this with gentle massage or by placing your hands in warm water for 10-15 minutes. ?It is normal to feel some stretching, pulling, tightness, or mild discomfort as you begin new exercises. This will gradually improve. Stop an exercise right away if you feel sudden, severe pain or your pain gets worse. Ask your health care provider which exercises are best for you. ?Knuckle bend or "claw" fist ? ?Stand or sit with your arm, hand, and all five fingers pointed straight up. Make sure to keep your wrist straight during the exercise. ?Gently bend your fingers down toward your palm until the tips of your fingers are touching the top of your palm. Keep your big knuckle straight and just bend the small knuckles in your fingers. ?Hold this position for __________ seconds. ?Straighten (extend) your fingers back to the starting position. ?Repeat this exercise 5-10 times with each hand. ?Full finger fist ? ?Stand or sit with your arm, hand, and all five fingers pointed straight up. Make sure to keep your wrist straight during the exercise. ?Gently bend your fingers into your palm until the tips of your fingers are touching the middle of your palm. ?Hold this position for __________ seconds. ?Extend your fingers back to the starting position, stretching every joint fully. ?Repeat  this exercise 5-10 times with each hand. ?Straight fist ?Stand or sit with your arm, hand, and all five fingers pointed straight up. Make sure to keep your wrist straight during the exercise. ?Gently bend your fingers at the big knuckle, where your fingers meet your hand, and the middle knuckle. Keep the knuckle at the tips of your fingers straight and try to touch the bottom of your palm. ?Hold this position for __________ seconds. ?Extend your fingers back to the starting position, stretching every joint fully. ?Repeat this exercise 5-10 times with each hand. ?Tabletop ? ?Stand or sit with your arm, hand, and all five fingers pointed straight up. Make sure to keep your wrist straight during the exercise. ?Gently bend your fingers at the big knuckle, where your fingers meet your hand, as far down as you can while keeping the small knuckles in your fingers straight. Think of forming a tabletop with your fingers. ?Hold this position for __________ seconds. ?Extend your fingers back to the starting position, stretching every joint fully. ?Repeat this exercise 5-10 times with each hand. ?Finger spread ? ?Place your hand flat on a table with your palm facing down. Make sure your wrist stays straight as you do this exercise. ?Spread your fingers and thumb apart from each other as far as you can until you feel a gentle stretch. Hold this position for __________ seconds. ?Bring your fingers and thumb tight together again. Hold this position for __________ seconds. ?Repeat this exercise 5-10 times with each hand. ?Making circles ? ?Stand or sit with your arm, hand, and all five fingers pointed   straight up. Make sure to keep your wrist straight during the exercise. ?Make a circle by touching the tip of your thumb to the tip of your index finger. ?Hold for __________ seconds. Then open your hand wide. ?Repeat this motion with your thumb and each finger on your hand. ?Repeat this exercise 5-10 times with each hand. ?Thumb  motion ? ?Sit with your forearm resting on a table and your wrist straight. Your thumb should be facing up toward the ceiling. Keep your fingers relaxed as you move your thumb. ?Lift your thumb up as high as you can toward the ceiling. Hold for __________ seconds. ?Bend your thumb across your palm as far as you can, reaching the tip of your thumb for the small finger (pinkie) side of your palm. Hold for __________ seconds. ?Repeat this exercise 5-10 times with each hand. ?Grip strengthening ? ?Hold a stress ball or other soft ball in the middle of your hand. ?Slowly increase the pressure, squeezing the ball as much as you can without causing pain. Think of bringing the tips of your fingers into the middle of your palm. All of your finger joints should bend when doing this exercise. ?Hold your squeeze for __________ seconds, then relax. ?Repeat this exercise 5-10 times with each hand. ?Contact a health care provider if: ?Your hand pain or discomfort gets much worse when you do an exercise. ?Your hand pain or discomfort does not improve within 2 hours after you exercise. ?If you have any of these problems, stop doing these exercises right away. Do not do them again unless your health care provider says that you can. ?Get help right away if: ?You develop sudden, severe hand pain or swelling. If this happens, stop doing these exercises right away. Do not do them again unless your health care provider says that you can. ?This information is not intended to replace advice given to you by your health care provider. Make sure you discuss any questions you have with your health care provider. ?Document Revised: 08/08/2020 Document Reviewed: 08/08/2020 ?Elsevier Patient Education ? 2022 [employer] ?Knee Exercises ?Ask your health care provider which exercises are safe for you. Do exercises exactly as told by your health care provider and adjust them as directed. It is normal to feel mild stretching, pulling, tightness, or  discomfort as you do these exercises. Stop right away if you feel sudden pain or your pain gets worse. Do not begin these exercises until told by your health care provider. ?Stretching and range-of-motion exercises ?These exercises warm up your muscles and joints and improve the movement and flexibility of your knee. These exercises also help to relieve pain and swelling. ?Knee extension, prone ? ?Lie on your abdomen (prone position) on a bed. ?Place your left / right knee just beyond the edge of the surface so your knee is not on the bed. You can put a towel under your left / right thigh just above your kneecap for comfort. ?Relax your leg muscles and allow gravity to straighten your knee (extension). You should feel a stretch behind your left / right knee. ?Hold this position for __________ seconds. ?Scoot up so your knee is supported between repetitions. ?Repeat __________ times. Complete this exercise __________ times a day. ?Knee flexion, active ? ?Lie on your back with both legs straight. If this causes back discomfort, bend your left / right knee so your foot is flat on the floor. ?Slowly slide your left / right heel back toward your buttocks. Stop when   you feel a gentle stretch in the front of your knee or thigh (flexion). ?Hold this position for __________ seconds. ?Slowly slide your left / right heel back to the starting position. ?Repeat __________ times. Complete this exercise __________ times a day. ?Quadriceps stretch, prone ? ?Lie on your abdomen on a firm surface, such as a bed or padded floor. ?Bend your left / right knee and hold your ankle. If you cannot reach your ankle or pant leg, loop a belt around your foot and grab the belt instead. ?Gently pull your heel toward your buttocks. Your knee should not slide out to the side. You should feel a stretch in the front of your thigh and knee (quadriceps). ?Hold this position for __________ seconds. ?Repeat __________ times. Complete this exercise  __________ times a day. ?Hamstring, supine ? ?Lie on your back (supine position). ?Loop a belt or towel over the ball of your left / right foot. The ball of your foot is on the walking surface, right under you

## 2021-07-25 ENCOUNTER — Telehealth: Payer: Self-pay

## 2021-07-25 LAB — TSH: TSH: 3.23 mIU/L

## 2021-07-25 LAB — CK: Total CK: 114 U/L (ref 29–143)

## 2021-07-25 LAB — ALDOLASE: Aldolase: 5.9 U/L (ref <=8.1)

## 2021-07-25 LAB — SEDIMENTATION RATE: Sed Rate: 43 mm/h — ABNORMAL HIGH (ref 0–30)

## 2021-07-25 NOTE — Telephone Encounter (Signed)
Patient contacted to schedule mammogram.  ? ?LVM for pt to call back as soon as possible.  ? ? ?RE: Mobile Mammo event located at: ? ?[email or web address]  ?      ?[address]    ?[place] [address]    ? ?Date: April 7th  ? ?

## 2021-07-27 NOTE — Progress Notes (Signed)
All the labs are within normal limits which includes CK, aldolase and TSH.  Sed rate continues to improve.

## 2021-08-01 ENCOUNTER — Other Ambulatory Visit: Payer: Self-pay | Admitting: Family Medicine

## 2021-08-01 ENCOUNTER — Other Ambulatory Visit: Payer: Self-pay | Admitting: Gastroenterology

## 2021-08-11 ENCOUNTER — Telehealth: Payer: Self-pay

## 2021-08-11 NOTE — Telephone Encounter (Signed)
PA required for Dexilant 60 mg (generic or brand) and Linzess 145 mcg.  Could not process through CoverMyMeds because patient's coverage could not be verified.  She no longer has [employer]. She now has [employer] effective Jan 2023. ID: [id number], GRP: [id number], KTC:[id number], no PCN. Called and submitted PA for both medications over the phone with [facility]. Call back [phone].  ?

## 2021-08-12 ENCOUNTER — Other Ambulatory Visit: Payer: Self-pay | Admitting: Family Medicine

## 2021-08-14 ENCOUNTER — Telehealth: Payer: Self-pay

## 2021-08-14 ENCOUNTER — Other Ambulatory Visit (HOSPITAL_COMMUNITY): Payer: Self-pay

## 2021-08-14 MED ORDER — PANTOPRAZOLE SODIUM 40 MG PO TBEC: 40.0000 mg | DELAYED_RELEASE_TABLET | Freq: Two times a day (BID) | ORAL | 2 refills | Status: AC

## 2021-08-14 NOTE — Telephone Encounter (Signed)
Inbound call from patient states her insurance is no longer covering [place] without patient paying $1100. She is requesting an alternative medication  ?

## 2021-08-14 NOTE — Telephone Encounter (Signed)
-----   Message from Carolyne Littles sent at 08/14/2021  9:41 AM EDT ----- ?Regarding: phone message ?Pt has appt tomorrow but is asking if we can get prior authorization on her pain medication so it can be refilled. ? ?

## 2021-08-14 NOTE — Telephone Encounter (Signed)
Called [employer]. They indicated all requests must be directed to the plan specifically for PA request. [employer] . Her Member id: [id number]. Phone [phone], #3, #3.  They do not See the PA 's that were submitted with Bridgett on 08-11-21.   ?Resubmitted Linzess 145 mcg via CoverMyMeds. ?Per [employer], Dexilant will not be covered but pantoprazole 40 mg would be approved without a PA.  ?Will send pantoprazole 40 mg Bid as alternative to Dexilant per plan coverage guidelines. ? ?  ? ?

## 2021-08-14 NOTE — Telephone Encounter (Signed)
Prior Auth for patients medication OXYCODONE-ACETAMINOPHEN approved by BCBS from 08/14/21 to 09/12/21. ? ?Key: [id number] ? ?PAYABLE AFTER 08/19/21 ? ?_______ ? ?A Prior Authorization was initiated for this patients OXYCODONE-ACETAMINOPHEN through CoverMyMeds.  ? ?Key: [id number] ? ?

## 2021-08-15 ENCOUNTER — Ambulatory Visit (INDEPENDENT_AMBULATORY_CARE_PROVIDER_SITE_OTHER): Payer: BLUE CROSS/BLUE SHIELD | Admitting: Family Medicine

## 2021-08-15 VITALS — BP 119/60 | Ht 67.0 in | Wt 267.0 lb

## 2021-08-15 DIAGNOSIS — Z79899 Other long term (current) drug therapy: Secondary | ICD-10-CM

## 2021-08-15 DIAGNOSIS — Z7901 Long term (current) use of anticoagulants: Secondary | ICD-10-CM

## 2021-08-15 DIAGNOSIS — Z9889 Other specified postprocedural states: Secondary | ICD-10-CM

## 2021-08-15 MED ORDER — SILVER SULFADIAZINE 1 % EX CREA: 1.0000 "application " | TOPICAL_CREAM | Freq: Two times a day (BID) | CUTANEOUS | 0 refills | Status: DC

## 2021-08-15 MED ORDER — WARFARIN SODIUM 5 MG PO TABS: 5.0000 mg | ORAL_TABLET | Freq: Every day | ORAL | 0 refills | Status: DC

## 2021-08-15 NOTE — Patient Instructions (Signed)
STOP the xarelto last dose today ?ST+ARt one warfarin tab a day and come in for blood work (call for a lab appt) in about 9 or 10 days. ?

## 2021-08-15 NOTE — Telephone Encounter (Signed)
Spoke w her at ov this AM and it is already fixed (somehow) by her insurance. She is not sure if they will need prior auth ion one moth or not but will let me know ?THANKS! ?[name] ? ?

## 2021-08-18 ENCOUNTER — Encounter: Payer: Self-pay | Admitting: Family Medicine

## 2021-08-18 NOTE — Assessment & Plan Note (Signed)
Tolerated and did well on xarelto. Due to financial constraints will switch t warfarin. Discussed process of labs and follow up. Will ask pharmacy if any potential for discount card for DOAC. 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  ?

## 2021-08-18 NOTE — Assessment & Plan Note (Signed)
Healing well. Can return to original dosing of pain meds at next rx ?
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
## 2021-08-18 NOTE — Assessment & Plan Note (Signed)
Working with her GI doctor as dexilant now too expensive. Will let me know if she does not get it worked out. ?

## 2021-08-20 ENCOUNTER — Encounter: Payer: Self-pay | Admitting: Family Medicine

## 2021-08-20 ENCOUNTER — Telehealth: Payer: Self-pay

## 2021-08-20 NOTE — Telephone Encounter (Signed)
Rec'd additional form to complete from [facility] regarding PA for Linzess 145.  Questions answered and faxed back to [facility] at [phone]. ?

## 2021-08-26 ENCOUNTER — Emergency Department (HOSPITAL_COMMUNITY)
Admission: EM | Admit: 2021-08-26 | Discharge: 2021-08-27 | Disposition: A | Discharge status: Home / Self Care | Payer: BLUE CROSS/BLUE SHIELD | Attending: Emergency Medicine | Admitting: Emergency Medicine

## 2021-08-26 ENCOUNTER — Telehealth: Payer: Self-pay | Admitting: Pharmacist

## 2021-08-26 ENCOUNTER — Other Ambulatory Visit (HOSPITAL_COMMUNITY): Payer: Self-pay

## 2021-08-26 DIAGNOSIS — Z7901 Long term (current) use of anticoagulants: Secondary | ICD-10-CM | POA: Diagnosis not present

## 2021-08-26 DIAGNOSIS — T2101XA Burn of unspecified degree of chest wall, initial encounter: Secondary | ICD-10-CM | POA: Diagnosis present

## 2021-08-26 DIAGNOSIS — X102XXA Contact with fats and cooking oils, initial encounter: Secondary | ICD-10-CM | POA: Insufficient documentation

## 2021-08-26 DIAGNOSIS — T25221A Burn of second degree of right foot, initial encounter: Secondary | ICD-10-CM | POA: Diagnosis not present

## 2021-08-26 DIAGNOSIS — Z79899 Other long term (current) drug therapy: Secondary | ICD-10-CM | POA: Insufficient documentation

## 2021-08-26 DIAGNOSIS — L03115 Cellulitis of right lower limb: Secondary | ICD-10-CM | POA: Insufficient documentation

## 2021-08-26 NOTE — Telephone Encounter (Signed)
Noted and agree. 

## 2021-08-26 NOTE — Telephone Encounter (Signed)
Contacted patient about use of anticoagulation long-term.  ? ?She states she is out of Xarelto and has been started warfarin.  ?She reports that she is bruising more with the warfarin than with xarelto.  ? ? ?I informed her that she should anticipate a call from [facility] [place] who will help her with options including return to Xarelto.  ? ?Discussed with Rosendo Gros that the transition back to Xarelto should be done at an INR visit when we can make sure INR < 3 at the time of transition.  ? ? ?

## 2021-08-26 NOTE — Telephone Encounter (Signed)
-----   Message from Dickie La, MD sent at 08/26/2021  3:25 PM EDT ----- ?Regarding: DOAC ?Can you check and see if there are any options for supplementary programs for DOAC for her. She has been on Xarelto for years but switched insurance and now cannot afford it. ?THANKS! ?Crystal Garza ? ? ?

## 2021-08-27 ENCOUNTER — Other Ambulatory Visit (HOSPITAL_COMMUNITY): Payer: Self-pay

## 2021-08-27 ENCOUNTER — Encounter (HOSPITAL_COMMUNITY): Payer: Self-pay

## 2021-08-27 ENCOUNTER — Other Ambulatory Visit: Payer: Self-pay

## 2021-08-27 ENCOUNTER — Ambulatory Visit (INDEPENDENT_AMBULATORY_CARE_PROVIDER_SITE_OTHER): Payer: BLUE CROSS/BLUE SHIELD | Admitting: *Deleted

## 2021-08-27 ENCOUNTER — Telehealth: Payer: Self-pay

## 2021-08-27 DIAGNOSIS — Z7901 Long term (current) use of anticoagulants: Secondary | ICD-10-CM | POA: Diagnosis not present

## 2021-08-27 DIAGNOSIS — T25221A Burn of second degree of right foot, initial encounter: Secondary | ICD-10-CM | POA: Diagnosis not present

## 2021-08-27 LAB — POCT INR: INR: 1.6 — AB (ref 2.0–3.0)

## 2021-08-27 MED ORDER — BACITRACIN ZINC 500 UNIT/GM EX OINT
TOPICAL_OINTMENT | CUTANEOUS | Status: AC
  Administered 2021-08-27: 2
  Filled 2021-08-27: qty 1.8

## 2021-08-27 MED ORDER — CEPHALEXIN 500 MG PO CAPS: ORAL_CAPSULE | ORAL | 0 refills | Status: DC

## 2021-08-27 MED ORDER — APIXABAN 2.5 MG PO TABS: 2.5000 mg | ORAL_TABLET | Freq: Two times a day (BID) | ORAL | 3 refills | Status: DC

## 2021-08-27 MED ORDER — CEPHALEXIN 500 MG PO CAPS
1000.0000 mg | ORAL_CAPSULE | Freq: Once | ORAL | Status: AC
  Administered 2021-08-27: 1000 mg via ORAL
  Filled 2021-08-27: qty 2
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
## 2021-08-27 NOTE — Addendum Note (Signed)
Addended by: Leavy Cella on: 08/27/2021 11:33 AM ? ? Modules accepted: Orders ? ?
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
## 2021-08-27 NOTE — Telephone Encounter (Signed)
Called in $10 copay card to pt's pharmacy for use with Eliquis medication  ? ? ?

## 2021-08-27 NOTE — [facility] Notes (Signed)
Patient requiring life-long anticoagulation.  Previously on Xarelto '10mg'$  daily.  ?Unfortunately, insurance change resulted in increase of Xarelto cost which is cost prohibitive for this patient.  Appears that co-apy card for Xarelto is not an option for this patient.  ? ?Discussed switch from Xarelto '10mg'$  daily to Eliquis (Apixiban) 2.'5mg'$  BID.  ?Medication Samples have been provided to the patient. ? ?Drug name: Eliquis       Strength: 2.'5mg'$         Qty: 28 tablets  LOT: [id number]  Exp.Date: 07/01/2022 ? ?Dosing instructions: 1 BID ? ?The patient has been instructed regarding the correct time, dose, and frequency of taking this medication, including desired effects and most common side effects.  Reinforced need for her to never miss doses and alert [name] if she is in short supply of this medication.  ? ?Crystal Garza ?11:27 AM ?08/27/2021 ? ? ?New prescription was sent to her pharmacy.  Copay card for Eliquis will be coordinated by Hortencia Pilar, CPhT. ?Cost is expected to be $10 with copay card.  ? ?Patient aware to STOP Warfarin.  INR was 1.6 today.  ?Patient instructed to being BID dosing later today with evening meal.  ? ?She was appreciative of the support.  ?
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
## 2021-09-01 ENCOUNTER — Other Ambulatory Visit: Payer: Self-pay | Admitting: Family Medicine

## 2021-09-01 DIAGNOSIS — F419 Anxiety disorder, unspecified: Secondary | ICD-10-CM

## 2021-09-01 NOTE — Telephone Encounter (Signed)
Thank you Jan, I do not see that she has tried Trulance in the past.  If that is the case we can give her samples.  1 tab daily, perhaps we have a weeks worth we can give her, if this works we can give her a prescription.  If it does not work we can try to get the [place].  Thank you very much ?

## 2021-09-01 NOTE — Telephone Encounter (Signed)
Called and spoke to [place]. PA for Linzess has been denied. Patient has to try and fail Trulance first.  Please advise if you would like for patient to try Trulance or another medication. Please note - we do have Trulance samples. ?

## 2021-09-02 MED ORDER — OXYCODONE-ACETAMINOPHEN 10-325 MG PO TABS: ORAL_TABLET | ORAL | 0 refills | Status: DC

## 2021-09-02 MED ORDER — TRULANCE 3 MG PO TABS: 3.0000 mg | ORAL_TABLET | Freq: Every day | ORAL | 0 refills | Status: DC

## 2021-09-02 NOTE — Telephone Encounter (Signed)
Called patient. She has not tried Trulance. She understands I will leave samples at the front desk for her to pick up to take once daily for several weeks. ?

## 2021-09-04 ENCOUNTER — Other Ambulatory Visit: Payer: Self-pay | Admitting: Family Medicine

## 2021-09-05 ENCOUNTER — Ambulatory Visit: Payer: BLUE CROSS/BLUE SHIELD

## 2021-10-05 ENCOUNTER — Other Ambulatory Visit: Payer: Self-pay

## 2021-10-05 ENCOUNTER — Telehealth: Payer: Self-pay | Admitting: Family Medicine

## 2021-10-05 ENCOUNTER — Encounter (HOSPITAL_COMMUNITY): Payer: Self-pay

## 2021-10-05 ENCOUNTER — Emergency Department (HOSPITAL_COMMUNITY): Payer: BLUE CROSS/BLUE SHIELD

## 2021-10-05 ENCOUNTER — Emergency Department (HOSPITAL_COMMUNITY)
Admission: EM | Admit: 2021-10-05 | Discharge: 2021-10-05 | Disposition: A | Discharge status: Home / Self Care | Payer: BLUE CROSS/BLUE SHIELD | Attending: Emergency Medicine | Admitting: Emergency Medicine

## 2021-10-05 DIAGNOSIS — M25561 Pain in right knee: Secondary | ICD-10-CM | POA: Insufficient documentation

## 2021-10-05 DIAGNOSIS — R0789 Other chest pain: Secondary | ICD-10-CM | POA: Diagnosis not present

## 2021-10-05 DIAGNOSIS — M25571 Pain in right ankle and joints of right foot: Secondary | ICD-10-CM | POA: Diagnosis not present

## 2021-10-05 DIAGNOSIS — Z7901 Long term (current) use of anticoagulants: Secondary | ICD-10-CM | POA: Insufficient documentation

## 2021-10-05 DIAGNOSIS — R079 Chest pain, unspecified: Secondary | ICD-10-CM | POA: Diagnosis present

## 2021-10-05 DIAGNOSIS — M25569 Pain in unspecified knee: Secondary | ICD-10-CM

## 2021-10-05 LAB — CBC
HCT: 40.5 % (ref 36.0–46.0)
Hemoglobin: 11.8 g/dL — ABNORMAL LOW (ref 12.0–15.0)
MCH: 25.8 pg — ABNORMAL LOW (ref 26.0–34.0)
MCHC: 29.1 g/dL — ABNORMAL LOW (ref 30.0–36.0)
MCV: 88.4 fL (ref 80.0–100.0)
Platelets: 413 10*3/uL — ABNORMAL HIGH (ref 150–400)
RBC: 4.58 MIL/uL (ref 3.87–5.11)
RDW: 14.6 % (ref 11.5–15.5)
WBC: 9.4 10*3/uL (ref 4.0–10.5)
nRBC: 0 % (ref 0.0–0.2)

## 2021-10-05 LAB — BASIC METABOLIC PANEL
Anion gap: 9 (ref 5–15)
BUN: 6 mg/dL (ref 6–20)
CO2: 23 mmol/L (ref 22–32)
Calcium: 9 mg/dL (ref 8.9–10.3)
Chloride: 109 mmol/L (ref 98–111)
Creatinine, Ser: 0.51 mg/dL (ref 0.44–1.00)
GFR, Estimated: >60 mL/min (ref >60)
Glucose, Bld: 68 mg/dL — ABNORMAL LOW (ref 70–99)
Potassium: 3.7 mmol/L (ref 3.5–5.1)
Sodium: 141 mmol/L (ref 135–145)

## 2021-10-05 LAB — TROPONIN I (HIGH SENSITIVITY)
Troponin I (High Sensitivity): 4 ng/L (ref <18)
Troponin I (High Sensitivity): 4 ng/L (ref <18)

## 2021-10-05 LAB — CBG MONITORING, ED: Glucose-Capillary: 85 mg/dL (ref 70–99)

## 2021-10-05 MED ORDER — ONDANSETRON HCL 4 MG/2ML IJ SOLN
4.0000 mg | Freq: Once | INTRAMUSCULAR | Status: AC
  Administered 2021-10-05: 4 mg via INTRAVENOUS
  Filled 2021-10-05: qty 2

## 2021-10-05 MED ORDER — OXYCODONE HCL 5 MG PO TABS
5.0000 mg | ORAL_TABLET | Freq: Once | ORAL | Status: AC
  Administered 2021-10-05: 5 mg via ORAL
  Filled 2021-10-05: qty 1

## 2021-10-05 NOTE — ED Notes (Signed)
AVS provided to and discussed with patient. Pt verbalizes understanding of discharge instructions and denies any questions or concerns at this time. Pt has ride home. Pt ambulated out of department independently with steady gait.  

## 2021-10-05 NOTE — ED Provider Notes (Signed)
[facility] DEPT Provider Note   CSN: [id number] Arrival date & time: 10/05/21  0762     History  Chief Complaint  Patient presents with   Chest Pain   Leg Pain    Crystal Garza is a 51 y.o. female.  Patient here mostly for right knee and right ankle pain.  Worse when she is walking.  She has no leg swelling.  She has a history of blood clots on Eliquis.  Has not missed any doses.  Has had some intermittent chest pain but seems worse with movement and reproducible.  She has had cardiac cath in the past she states.  Nothing has made it worse or better.  She does have chronic narcotics for similar pain.  She denies any fevers or chills.  No infectious symptoms.  Pain does not get worse with exertion.  Not short of breath.  No fevers.  The history is provided by the patient.      Home Medications Prior to Admission medications   Medication Sig Start Date End Date Taking? Authorizing Provider  ALPRAZolam (XANAX) 1 MG tablet TAKE ONE TABLET BY MOUTH EVERY MORNING TAKE ONE TABLET MIDDAY AND TAKE TWO TABLETS AT BEDTIME AS NEEDED FOR ANXIETY 09/02/21   Dickie La, MD  apixaban (ELIQUIS) 2.5 MG TABS tablet Take 1 tablet (2.5 mg total) by mouth 2 (two) times daily. 08/27/21   Dickie La, MD  cephALEXin (KEFLEX) 500 MG capsule 2 caps po bid x 7 days 08/27/21   Deno Etienne, DO  DULoxetine (CYMBALTA) 20 MG capsule Take 1 capsule (20 mg total) by mouth daily. 02/07/21   Dickie La, MD  famotidine (PEPCID) 20 MG tablet Take 1 tablet (20 mg total) by mouth 2 (two) times daily. 04/07/21   Garnet Sierras, DO  lisinopril (ZESTRIL) 10 MG tablet TAKE 1 TABLET EACH DAY. 08/01/21   Dickie La, MD  ondansetron (ZOFRAN-ODT) 4 MG disintegrating tablet Take 1 tablet (4 mg total) by mouth 2 (two) times daily as needed for nausea or vomiting. 09/17/20   Dickie La, MD  oxyCODONE-acetaminophen (PERCOCET) 10-325 MG tablet TAKE ONE TABLET BY MOUTH EVERY 4 to 6 HOURS AS NEEDED CHRONIC  PAIN max of 6 tabs in 24 hours 09/02/21   Dickie La, MD  pantoprazole (PROTONIX) 40 MG tablet Take 1 tablet (40 mg total) by mouth 2 (two) times daily. 08/14/21   Armbruster, Carlota Raspberry, MD  Plecanatide (TRULANCE) 3 MG TABS Take 3 mg by mouth daily. Lot: [id number] exp: 01-2023 09/02/21   Yetta Flock, MD  silver sulfADIAZINE (SILVADENE) 1 % cream Apply 1 application. topically 2 (two) times daily. 08/15/21   Dickie La, MD  solifenacin (VESICARE) 5 MG tablet Take 5 mg by mouth daily. 11/20/19   [provider]  warfarin (COUMADIN) 5 MG tablet Take 1 tablet (5 mg total) by mouth daily. 09/04/21   Dickie La, MD      Allergies    Codeine and Blain Pais allergy]    Review of Systems   Review of Systems  Physical Exam Updated Vital Signs BP 134/78   Pulse 69   Temp 97.9 F (36.6 C) (Oral)   Resp 19   Ht '5\' 7"'$  (1.702 m)   Wt 120.7 kg   SpO2 96%   BMI 41.66 kg/m  Physical Exam Vitals and nursing note reviewed.  Constitutional:      General: She is not in acute distress.  Appearance: She is well-developed. She is not ill-appearing.  HENT:     Head: Normocephalic and atraumatic.  Eyes:     Conjunctiva/sclera: Conjunctivae normal.  Cardiovascular:     Rate and Rhythm: Normal rate and regular rhythm.     Heart sounds: No murmur heard. Pulmonary:     Effort: Pulmonary effort is normal. No respiratory distress.     Breath sounds: Normal breath sounds.  Chest:     Chest wall: Tenderness present.     Comments: Tenderness to chest wall Abdominal:     Palpations: Abdomen is soft.     Tenderness: There is no abdominal tenderness.  Musculoskeletal:        General: No swelling.     Cervical back: Neck supple.     Right lower leg: No edema.     Left lower leg: No edema.     Comments: Tenderness to the right knee and right ankle, there is no calf tenderness, there is lower leg edema, there is no swelling of the joints  Skin:    General: Skin is warm and dry.     Capillary  Refill: Capillary refill takes less than 2 seconds.  Neurological:     Mental Status: She is alert.  Psychiatric:        Mood and Affect: Mood normal.    ED Results / Procedures / Treatments   Labs (all labs ordered are listed, but only abnormal results are displayed) Labs Reviewed  BASIC METABOLIC PANEL - Abnormal; Notable for the following components:      Result Value   Glucose, Bld 68 (*)    All other components within normal limits  CBC - Abnormal; Notable for the following components:   Hemoglobin 11.8 (*)    MCH 25.8 (*)    MCHC 29.1 (*)    Platelets 413 (*)    All other components within normal limits  CBG MONITORING, ED  TROPONIN I (HIGH SENSITIVITY)  TROPONIN I (HIGH SENSITIVITY)    EKG EKG Interpretation  Date/Time:  Sunday October 05 2021 08:27:35 EDT Ventricular Rate:  80 PR Interval:  153 QRS Duration: 90 QT Interval:  363 QTC Calculation: 419 R Axis:   57 Text Interpretation: Sinus rhythm Low voltage, precordial leads Confirmed by Lennice Sites ([id number]) on 10/05/2021 8:45:01 AM  Radiology DG Chest 2 View  Result Date: 10/05/2021 CLINICAL DATA:  Left-sided chest pain for 2 days. Shortness of breath. EXAM: CHEST - 2 VIEW COMPARISON:  02/22/2019 FINDINGS: The heart size and mediastinal contours are within normal limits. Both lungs are clear. The visualized skeletal structures are unremarkable. IMPRESSION: No active cardiopulmonary disease. Electronically Signed   By: Marlaine Hind M.D.   On: 10/05/2021 08:50   DG Ankle Complete Right  Result Date: 10/05/2021 CLINICAL DATA:  Right ankle pain for 2 days.  No known injury. EXAM: RIGHT ANKLE - COMPLETE 3+ VIEW COMPARISON:  None Available. FINDINGS: There is no evidence of fracture, dislocation, or joint effusion. There is no evidence of arthropathy. Prominent plantar calcaneal bone spur noted. Soft tissues are unremarkable. IMPRESSION: No acute findings. Prominent plantar calcaneal bone spur. Electronically Signed   By:  Marlaine Hind M.D.   On: 10/05/2021 09:51   DG Knee Complete 4 Views Right  Result Date: 10/05/2021 CLINICAL DATA:  Anterior right knee pain for 2 days. No known injury. EXAM: RIGHT KNEE - COMPLETE 4+ VIEW COMPARISON:  None Available. FINDINGS: No evidence of fracture, dislocation, or joint effusion. 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 No other osseous abnormality identified. Soft tissues are unremarkable. IMPRESSION: No acute findings. Mild tricompartmental osteoarthritis. Electronically Signed   By: Marlaine Hind M.D.   On: 10/05/2021 09:50    Procedures Procedures    Medications Ordered in ED Medications  oxyCODONE (Oxy IR/ROXICODONE) immediate release tablet 5 mg (5 mg Oral Given 10/05/21 0855)  ondansetron (ZOFRAN) injection 4 mg (4 mg Intravenous Given 10/05/21 1018)    ED Course/ Medical Decision Making/ A&P                           Medical Decision Making Amount and/or Complexity of Data Reviewed Labs: ordered. Radiology: ordered.  Risk Prescription drug management.   Crystal Garza is here with chest pain, right knee and right ankle pain.  Normal vitals.  No fever.  Differential diagnosis is likely costochondritis/arthritis process, less likely fracture, ACS, pneumonia.  Pain appears to be reproducible in these areas in her chest wall, knee and ankle.  Per chart review she had a cardiac cath several years ago with normal coronaries.  She is on Eliquis for PE and DVT.  She is compliant.  She is not hypoxic or short of breath.  She has no signs to suggest DVT on exam.  I have no concern for peripheral arterial occlusion as she has good pulses throughout.  Overall we will give a dose of her home narcotic and check CBC, BMP, troponin, chest x-ray, EKG.  Chest x-ray per my review and interpretation shows no pneumonia or pneumothorax.  EKG shows sinus rhythm.  No ischemic changes.  Per my review and interpretation of labs there is no significant anemia, electrolyte  abnormality, kidney injury.  Troponin negative x2.  Blood sugar was 68 but she had not had anything to eat or drink this morning.  She was able to eat and drink without any issues.  Repeat blood sugar is normal.  Overall suspect musculoskeletal pain.  Discharged in good condition.  This chart was dictated using voice recognition software.  Despite best efforts to proofread,  errors can occur which can change the documentation meaning.         Final Clinical Impression(s) / ED Diagnoses Final diagnoses:  Chest wall pain  Knee pain, unspecified chronicity, unspecified laterality    Rx / DC Orders ED Discharge Orders     None         Lennice Sites, DO 10/05/21 1119

## 2021-10-05 NOTE — ED Triage Notes (Signed)
Patient c/o intermittent left chest pain x 2 days, but worse today. Patient also c/o SOB, but denies n/v or diaphoresis.  Patient c/o constant pain in the right leg from right lower thigh to the top of the right foot x 2 days. Pain is worse with weight bearing. Patient denies any injury.

## 2021-10-05 NOTE — Telephone Encounter (Signed)
**  After Hours/ Emergency Line Call**  Received after hours emergency line call from [place]. She reports left upper arm and left sided chest pain, both of which have been coming and going for the past 2 days. Arm pain feels like she has a blood pressure cuff squeezing her arm. Chest pain is described as left sided stabbing, non-exertional.  Her main concern, however, is pain in her right leg. This has been worsening over the past 2 days. It is described as a heaviness in her right calf/shin area with some swelling in her ankle region and ?increased warmth. No redness.  She also endorses slight shortness of breath since last night.  No respiratory distress noted. She is speaking in full sentences.  Given her constellation of calf pain, shortness of breath, and chest pain, especially in light of her history of DVT/PE, I recommended patient proceed to the ED for evaluation. She was agreeable.   Alcus Dad, MD PGY-2, [facility] 10/05/2021 7:58 AM

## 2021-10-06 ENCOUNTER — Encounter: Payer: Self-pay | Admitting: Family Medicine

## 2021-10-06 ENCOUNTER — Other Ambulatory Visit: Payer: Self-pay | Admitting: Family Medicine

## 2021-10-06 MED ORDER — OXYCODONE-ACETAMINOPHEN 10-325 MG PO TABS: ORAL_TABLET | ORAL | 0 refills | Status: DC

## 2021-10-07 ENCOUNTER — Other Ambulatory Visit: Payer: Self-pay | Admitting: Family Medicine

## 2021-10-07 ENCOUNTER — Encounter: Payer: Self-pay | Admitting: *Deleted

## 2021-10-09 ENCOUNTER — Other Ambulatory Visit: Payer: Self-pay | Admitting: Family Medicine

## 2021-10-09 MED ORDER — BACLOFEN 10 MG PO TABS: 10.0000 mg | ORAL_TABLET | Freq: Two times a day (BID) | ORAL | 0 refills | Status: DC

## 2021-10-28 ENCOUNTER — Other Ambulatory Visit: Payer: Self-pay | Admitting: Family Medicine

## 2021-10-28 MED ORDER — OXYCODONE-ACETAMINOPHEN 10-325 MG PO TABS
ORAL_TABLET | ORAL | 0 refills | Status: DC
Start: 2021-10-28 — End: 2021-11-26

## 2021-11-06 ENCOUNTER — Other Ambulatory Visit (HOSPITAL_COMMUNITY): Payer: Self-pay

## 2021-11-06 ENCOUNTER — Telehealth: Payer: Self-pay | Admitting: Pharmacy Technician

## 2021-11-06 NOTE — Telephone Encounter (Signed)
Patient Advocate Encounter  Received notification from [facility] that prior authorization for PANTOPRAZOLE 40 MG is required.   PA submitted on 11/06/2021 [place] Status is pending    Luciano Cutter, CPhT Patient Advocate Phone: [phone]

## 2021-11-07 ENCOUNTER — Other Ambulatory Visit (HOSPITAL_COMMUNITY): Payer: Self-pay

## 2021-11-17 NOTE — Telephone Encounter (Signed)
Received a fax regarding Prior Authorization from [facility] for PANTOPRAZOLE '40MG'$ . Authorization has been DENIED because this insurance requires patient to try/fail esomeprazole, omeprazole, and lansoprazole.  Current patient chart indicates only Omeprazole has been try/failed.  Clista Bernhardt, CPhT Pharmacy Patient Advocate Specialist [place] Patient Advocate Team Phone: [phone]   Fax: [phone]

## 2021-11-20 ENCOUNTER — Other Ambulatory Visit (HOSPITAL_COMMUNITY): Payer: Self-pay

## 2021-11-21 ENCOUNTER — Encounter: Payer: Self-pay | Admitting: Family Medicine

## 2021-11-21 ENCOUNTER — Ambulatory Visit (INDEPENDENT_AMBULATORY_CARE_PROVIDER_SITE_OTHER): Payer: BLUE CROSS/BLUE SHIELD | Admitting: Family Medicine

## 2021-11-21 VITALS — BP 128/78 | Ht 67.0 in | Wt 265.0 lb

## 2021-11-21 DIAGNOSIS — G5701 Lesion of sciatic nerve, right lower limb: Secondary | ICD-10-CM | POA: Diagnosis not present

## 2021-11-21 DIAGNOSIS — M79671 Pain in right foot: Secondary | ICD-10-CM | POA: Diagnosis not present

## 2021-11-21 DIAGNOSIS — M797 Fibromyalgia: Secondary | ICD-10-CM | POA: Diagnosis not present

## 2021-11-21 MED ORDER — CYCLOBENZAPRINE HCL 10 MG PO TABS: 10.0000 mg | ORAL_TABLET | Freq: Two times a day (BID) | ORAL | 3 refills | Status: DC | PRN
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
## 2021-11-25 ENCOUNTER — Other Ambulatory Visit: Payer: Self-pay | Admitting: Family Medicine

## 2021-11-26 ENCOUNTER — Other Ambulatory Visit: Payer: Self-pay | Admitting: Family Medicine

## 2021-11-28 ENCOUNTER — Other Ambulatory Visit: Payer: Self-pay | Admitting: Family Medicine

## 2021-11-28 MED ORDER — OXYCODONE-ACETAMINOPHEN 10-325 MG PO TABS: ORAL_TABLET | ORAL | 0 refills | Status: DC

## 2021-12-01 ENCOUNTER — Ambulatory Visit (INDEPENDENT_AMBULATORY_CARE_PROVIDER_SITE_OTHER): Payer: BLUE CROSS/BLUE SHIELD | Admitting: Podiatry

## 2021-12-01 ENCOUNTER — Other Ambulatory Visit: Payer: Self-pay | Admitting: Podiatry

## 2021-12-01 ENCOUNTER — Ambulatory Visit (INDEPENDENT_AMBULATORY_CARE_PROVIDER_SITE_OTHER): Payer: BLUE CROSS/BLUE SHIELD

## 2021-12-01 ENCOUNTER — Encounter: Payer: Self-pay | Admitting: Podiatry

## 2021-12-01 DIAGNOSIS — M778 Other enthesopathies, not elsewhere classified: Secondary | ICD-10-CM | POA: Diagnosis not present

## 2021-12-01 DIAGNOSIS — M79671 Pain in right foot: Secondary | ICD-10-CM | POA: Diagnosis not present

## 2021-12-01 DIAGNOSIS — M7751 Other enthesopathy of right foot: Secondary | ICD-10-CM | POA: Diagnosis not present

## 2021-12-01 MED ORDER — METHYLPREDNISOLONE 4 MG PO TBPK: ORAL_TABLET | ORAL | 0 refills | Status: DC
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
## 2021-12-02 ENCOUNTER — Telehealth: Payer: Self-pay | Admitting: *Deleted

## 2021-12-02 NOTE — Telephone Encounter (Signed)
Patient came in and exchanged her [place] shoe for a new one, said that one given was too large-medium one fits much better. DME financial form signed by patient -12/02/21.

## 2021-12-02 NOTE — Telephone Encounter (Signed)
Patient called  and said that surgical shoe given is too large. she will come in to to exchange.

## 2021-12-11 ENCOUNTER — Telehealth: Payer: Self-pay | Admitting: Podiatry

## 2021-12-11 NOTE — Telephone Encounter (Signed)
Pt was seen in office on 7/31 & received a boot for her ankle. She stated that boot is not helping with pain; it hurts more now with the boot. She stated that she was told to call in if it did not help lessen her ankle pain. Please advise.

## 2021-12-15 ENCOUNTER — Telehealth: Payer: Self-pay | Admitting: Podiatry

## 2021-12-15 NOTE — Telephone Encounter (Signed)
Pt stated she got a missed call from Dr Paulla Dolly and was wanting a call back.

## 2021-12-16 NOTE — Telephone Encounter (Signed)
Error message

## 2021-12-17 NOTE — Telephone Encounter (Signed)
She is seeing dr. Jacqualyn Posey in next 2 weeks

## 2021-12-24 ENCOUNTER — Other Ambulatory Visit: Payer: Self-pay | Admitting: Family Medicine

## 2021-12-24 DIAGNOSIS — F419 Anxiety disorder, unspecified: Secondary | ICD-10-CM

## 2021-12-25 MED ORDER — OXYCODONE-ACETAMINOPHEN 10-325 MG PO TABS: ORAL_TABLET | ORAL | 0 refills | Status: DC

## 2021-12-29 ENCOUNTER — Ambulatory Visit (INDEPENDENT_AMBULATORY_CARE_PROVIDER_SITE_OTHER): Payer: BLUE CROSS/BLUE SHIELD

## 2021-12-29 ENCOUNTER — Encounter: Payer: Self-pay | Admitting: Podiatry

## 2021-12-29 ENCOUNTER — Ambulatory Visit (INDEPENDENT_AMBULATORY_CARE_PROVIDER_SITE_OTHER): Payer: BLUE CROSS/BLUE SHIELD | Admitting: Podiatry

## 2021-12-29 DIAGNOSIS — M778 Other enthesopathies, not elsewhere classified: Secondary | ICD-10-CM

## 2021-12-29 DIAGNOSIS — M7752 Other enthesopathy of left foot: Secondary | ICD-10-CM

## 2021-12-29 DIAGNOSIS — M779 Enthesopathy, unspecified: Secondary | ICD-10-CM | POA: Diagnosis not present

## 2021-12-29 DIAGNOSIS — S92354A Nondisplaced fracture of fifth metatarsal bone, right foot, initial encounter for closed fracture: Secondary | ICD-10-CM

## 2021-12-29 DIAGNOSIS — M7751 Other enthesopathy of right foot: Secondary | ICD-10-CM

## 2021-12-29 MED ORDER — CICLOPIROX 8 % EX SOLN: Freq: Every day | CUTANEOUS | 2 refills | Status: DC
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
## 2022-01-04 ENCOUNTER — Encounter: Payer: Self-pay | Admitting: Family Medicine

## 2022-01-04 ENCOUNTER — Telehealth: Payer: Self-pay | Admitting: *Deleted

## 2022-01-04 NOTE — Telephone Encounter (Signed)
Hello  My body went into aching part starting Friday  Worst headache every and back and leg pain but come to find out  I have Covid from my job  I was tested on Saturday 01/03/22 and test positive  But another coworker ask me ask my medical doctor could get some medication called  Can u take paxlovid  Not sure what this is  But just asking a question  Thank you

## 2022-01-06 ENCOUNTER — Encounter: Payer: Self-pay | Admitting: Family Medicine

## 2022-01-06 ENCOUNTER — Telehealth: Payer: Self-pay

## 2022-01-06 MED ORDER — PAXLOVID (150/100) 10 X 150 MG & 10 X 100MG PO TBPK: 1.0000 | ORAL_TABLET | Freq: Two times a day (BID) | ORAL | 0 refills | Status: DC

## 2022-01-06 NOTE — Telephone Encounter (Signed)
Discussed  Rx paxlovid Will place note for rtw in my chart

## 2022-01-06 NOTE — Progress Notes (Signed)
Note for work done

## 2022-01-08 ENCOUNTER — Other Ambulatory Visit: Payer: Self-pay | Admitting: Podiatry

## 2022-01-08 DIAGNOSIS — M779 Enthesopathy, unspecified: Secondary | ICD-10-CM

## 2022-01-08 DIAGNOSIS — S92354A Nondisplaced fracture of fifth metatarsal bone, right foot, initial encounter for closed fracture: Secondary | ICD-10-CM

## 2022-01-08 NOTE — Telephone Encounter (Signed)
Prior authorization has been processed today. Once we get a response back imaging will be contacted to schedule appt.

## 2022-01-16 ENCOUNTER — Ambulatory Visit: Payer: BLUE CROSS/BLUE SHIELD | Admitting: Family Medicine

## 2022-01-27 ENCOUNTER — Ambulatory Visit: Payer: BLUE CROSS/BLUE SHIELD | Admitting: Podiatry

## 2022-01-27 ENCOUNTER — Other Ambulatory Visit: Payer: BLUE CROSS/BLUE SHIELD

## 2022-01-27 NOTE — Addendum Note (Signed)
Addended by: Blair Heys L on: 01/27/2022 09:07 AM   Modules accepted: Orders

## 2022-01-28 ENCOUNTER — Other Ambulatory Visit: Payer: Self-pay | Admitting: Family Medicine

## 2022-01-30 ENCOUNTER — Other Ambulatory Visit: Payer: Self-pay | Admitting: Family Medicine

## 2022-01-30 ENCOUNTER — Telehealth: Payer: Self-pay | Admitting: *Deleted

## 2022-01-30 ENCOUNTER — Ambulatory Visit (INDEPENDENT_AMBULATORY_CARE_PROVIDER_SITE_OTHER): Payer: BLUE CROSS/BLUE SHIELD | Admitting: Family Medicine

## 2022-01-30 VITALS — BP 131/67 | Ht 67.0 in | Wt 275.0 lb

## 2022-01-30 DIAGNOSIS — M542 Cervicalgia: Secondary | ICD-10-CM | POA: Diagnosis not present

## 2022-01-30 DIAGNOSIS — M549 Dorsalgia, unspecified: Secondary | ICD-10-CM | POA: Diagnosis not present

## 2022-01-30 DIAGNOSIS — M6780 Other specified disorders of synovium and tendon, unspecified site: Secondary | ICD-10-CM | POA: Diagnosis not present

## 2022-01-30 DIAGNOSIS — M5412 Radiculopathy, cervical region: Secondary | ICD-10-CM

## 2022-01-30 DIAGNOSIS — G8929 Other chronic pain: Secondary | ICD-10-CM

## 2022-01-30 MED ORDER — OXYCODONE-ACETAMINOPHEN 10-325 MG PO TABS
ORAL_TABLET | ORAL | 0 refills | Status: DC
Start: 2022-01-30 — End: 2022-02-26

## 2022-01-30 MED ORDER — OXYCODONE-ACETAMINOPHEN 10-325 MG PO TABS: ORAL_TABLET | ORAL | 0 refills | Status: DC

## 2022-01-30 MED ORDER — METHYLPREDNISOLONE ACETATE 40 MG/ML IJ SUSP
40.0000 mg | Freq: Once | INTRAMUSCULAR | Status: AC
  Administered 2022-01-30: 40 mg via INTRA_ARTICULAR

## 2022-01-30 NOTE — Progress Notes (Signed)
Established Patient Office Visit  Subjective   Patient ID: Crystal Garza, female    DOB: 1971-03-31  Age: 51 y.o. MRN: [id number]  Back and neck pain.   [name] is here today with chief complaint of neck and back pain.  She has been experiencing this pain for over a month, that comes and goes.  Today her pain has tolerable.  She has tried Tylenol and heat with some relief.  States that her pain is worse with walking or standing for prolonged periods of time.  Her neck pain is diffuse but feels like it radiates down all the way to her back and sometimes down her left arm with numbness and tingling.  She has had shots in her neck in the past approximately a year and a half ago that did not give her any relief.  Her low back pain radiates into her posterior thighs.  She denies any loss of bowel or bladder incontinence or numbness down her legs.  Her pain does not change with flexion or extension.  Remains the same.  She also admits she has had some increase stressors in her life recently, her son was in the ICU.  Of note she did recently get married.   ROS as listed above in HPI    Objective:     BP 131/67   Ht '5\' 7"'$  (1.702 m)   Wt 275 lb (124.7 kg)   BMI 43.07 kg/m   Physical Exam Vitals reviewed.  Constitutional:      General: She is not in acute distress.    Appearance: Normal appearance. She is obese. She is not ill-appearing, toxic-appearing or diaphoretic.  HENT:     Head: Normocephalic.  Pulmonary:     Effort: Pulmonary effort is normal.  Neurological:     Mental Status: She is alert.   Patient was seated on the exam table slumped forward in flexion when I entered the room.  She reported that position does not give her relief though back: No obvious deformity or asymmetry.  No ecchymosis or edema.  She has midline tenderness from C1 down to S1.  Along with paraspinals, diffuse tenderness to palpation of her neck and back.  No tenderness to palpation along the  bilateral SI joints.  Pain with stretch of the piriformis figure 4 on the left.  Tenderness to palpation over the gluteus medius muscle.  She has grip strength 5/5, no appreciable atrophy in the hands.  Sensation intact to light touch of bilateral upper extremities.  Negative straight leg raise bilaterally.      Assessment & Plan:   Problem List Items Addressed This Visit       Nervous and Auditory   Cervical radiculopathy at C6 - Primary    Patient has had shots in her neck in the past she reports the last one was a year and a half ago but they did not give her any relief.  Patient would not like to do any physical therapy as she states that has not helped in the past either.        Other   Chronic neck and back pain    Patient does have some home physical therapy stretches and exercises which she has been doing in the past however they have become difficult secondary to the pain.  We discussed getting her back with interventional pain specialist for potential injections versus radiofrequency ablation.  She verbalized understanding.  Today she was given an injection into her  glutes medius for some relief.      Relevant Medications   oxyCODONE-acetaminophen (PERCOCET) 10-325 MG tablet   After verbal consent was obtained patient skin was cleansed with alcohol and she was injected with a 3: 1 injection of lidocaine without epi 1% and Depo-Medrol, into the tender point of the gluteus medius on the left.  Patient tolerated the procedure well.   Return in about 4 weeks (around 02/27/2022).    Elmore Guise, DO

## 2022-01-30 NOTE — Assessment & Plan Note (Signed)
Patient has had shots in her neck in the past she reports the last one was a year and a half ago but they did not give her any relief.  Patient would not like to do any physical therapy as she states that has not helped in the past either.

## 2022-01-30 NOTE — Telephone Encounter (Signed)
[facility] [place]), [phone] would like prior authorization for the MRI or if not required, please contact.

## 2022-01-30 NOTE — Progress Notes (Signed)
[facility]: Attending Note: I have reviewed the chart, discussed wit the Sports Medicine Fellow. I agree with assessment and treatment plan as detailed in the Fellow's note.  

## 2022-01-30 NOTE — Assessment & Plan Note (Signed)
Patient does have some home physical therapy stretches and exercises which she has been doing in the past however they have become difficult secondary to the pain.  We discussed getting her back with interventional pain specialist for potential injections versus radiofrequency ablation.  She verbalized understanding.  Today she was given an injection into her glutes medius for some relief.

## 2022-01-30 NOTE — Telephone Encounter (Signed)
PA has been started and we are waiting on a response from the insurance company.

## 2022-02-04 ENCOUNTER — Telehealth: Payer: Self-pay

## 2022-02-05 NOTE — Telephone Encounter (Signed)
Encounter created in error

## 2022-02-09 ENCOUNTER — Ambulatory Visit (INDEPENDENT_AMBULATORY_CARE_PROVIDER_SITE_OTHER): Payer: BLUE CROSS/BLUE SHIELD | Admitting: Podiatry

## 2022-02-09 ENCOUNTER — Encounter: Payer: Self-pay | Admitting: Podiatry

## 2022-02-09 DIAGNOSIS — M7751 Other enthesopathy of right foot: Secondary | ICD-10-CM

## 2022-02-09 DIAGNOSIS — Q828 Other specified congenital malformations of skin: Secondary | ICD-10-CM | POA: Diagnosis not present

## 2022-02-09 DIAGNOSIS — M778 Other enthesopathies, not elsewhere classified: Secondary | ICD-10-CM | POA: Diagnosis not present

## 2022-02-09 NOTE — Progress Notes (Unsigned)
Subjective:   Patient ID: Crystal Garza, female   DOB: 51 y.o.   MRN: [id number]   HPI Chief Complaint  Patient presents with   Foot Pain    Capsulitis right foot still having pain, unable to get MRI, Rate of pain 7 out of 10. Patient states she is doing worse this visit.      51 year old female with above concerns.  States that she still having pain to the right foot mostly the top of the foot.  She is trying immobilization which is not helping.  She still awaiting MRI.  She did follow-up with her primary doctor as well to make sure nothing else was going on with her foot.  She also gets a callus or something going on inside of her right foot.  This is also causing pain.  No open lesions.  No recent injury or changes otherwise.   Review of Systems  All other systems reviewed and are negative.      Objective:  Physical Exam  General: AAO x3, NAD  Dermatological: On the right foot there is callus formation with this metatarsal base as well as the fifth toe without any underlying ulceration drainage or signs of infection.  There is no open lesions.  Vascular: Dorsalis Pedis artery and Posterior Tibial artery pedal pulses are 2/4 bilateral with immedate capillary fill time.  There is no pain with calf compression, swelling, warmth, erythema.   Neruologic: Grossly intact via light touch bilateral.  Negative Tinel sign.  Musculoskeletal: On the right foot there is diffuse tenderness to the dorsal and lateral aspect of foot metatarsals 3 through 5 extending along the lateral aspect of foot.  Persist.  No area pinpoint tenderness.  Trace edema.  No erythema or warmth.  Muscular strength 5/5 in all groups tested bilateral.  Flatfoot present.  Gait: Unassisted, Nonantalgic.   Assessment:   Capsulitis, tendinitis right foot     Plan:  -Treatment options discussed including all alternatives, risks, and complications. -Etiology of symptoms were discussed -Secondary to counseling  complications or bleeding x 2.  Moisturizer, offloading. -I still recommend an MRI given her continued pain.  She is doing well with immobilization, home rehab, exercise and medication options without significant improvement she continues to have pain. -Continue supportive shoe gear for now.  Trula Slade DPM

## 2022-02-25 ENCOUNTER — Encounter: Payer: Self-pay | Admitting: Family Medicine

## 2022-02-25 ENCOUNTER — Other Ambulatory Visit: Payer: Self-pay | Admitting: Family Medicine

## 2022-02-26 ENCOUNTER — Other Ambulatory Visit: Payer: Self-pay | Admitting: Family Medicine

## 2022-02-27 MED ORDER — OXYCODONE-ACETAMINOPHEN 10-325 MG PO TABS: ORAL_TABLET | ORAL | 0 refills | Status: DC

## 2022-03-10 ENCOUNTER — Ambulatory Visit (INDEPENDENT_AMBULATORY_CARE_PROVIDER_SITE_OTHER): Payer: BLUE CROSS/BLUE SHIELD | Admitting: Podiatry

## 2022-03-10 DIAGNOSIS — M778 Other enthesopathies, not elsewhere classified: Secondary | ICD-10-CM | POA: Diagnosis not present

## 2022-03-10 DIAGNOSIS — M7751 Other enthesopathy of right foot: Secondary | ICD-10-CM

## 2022-03-10 DIAGNOSIS — M205X1 Other deformities of toe(s) (acquired), right foot: Secondary | ICD-10-CM | POA: Diagnosis not present

## 2022-03-10 MED ORDER — TRIAMCINOLONE ACETONIDE 10 MG/ML IJ SUSP: 10.0000 mg | Freq: Once | INTRAMUSCULAR | Status: DC

## 2022-03-10 NOTE — Patient Instructions (Signed)

## 2022-03-10 NOTE — Progress Notes (Signed)
Subjective: Chief Complaint  Patient presents with   Foot Pain    Capsuliitis right foot, patient states she is doing the same as last visit, 7 out of 10 pain rate, MRI result     51 year old female presents the office today with above concerns.  She states that she still having discomfort on the top of her foot.  She presents to discuss MRI results.  No new changes or concerns otherwise. ----- MRI 03/03/2022 CLINICAL DATA:  "Closed nondisplaced fracture of fifth metatarsal  bone of right foot, sequela"   EXAM:  MRI OF THE RIGHT FOREFOOT WITHOUT CONTRAST   TECHNIQUE:  Multiplanar, multisequence MR imaging of the right forefoot was  performed. No intravenous contrast was administered.   COMPARISON:  None Available.   FINDINGS:  Bones/Joint/Cartilage   No acute or healing fracture of the right forefoot. Fifth metatarsal  bone is intact. Normal alignment. Moderate midfoot osteoarthritis  with subchondral cystic changes and reactive subchondral marrow  edema, most pronounced at the naviculocuneiform articulations. No  significant arthropathy within the forefoot distally. No erosions.  No suspicious bone lesion.   Ligaments   Intact Lisfranc ligament.  Intact collateral ligaments.   Muscles and Tendons   Musculotendinous structures are within normal limits.   Soft tissues   Small amount of fluid within the first, second, and third  intermetatarsal spaces. No intermetatarsal space soft tissue mass.  No ulcerations. No additional fluid collections.   IMPRESSION:  1. No acute or healing fracture of the right forefoot with attention  to the fifth metatarsal bone.  2. Moderate midfoot osteoarthritis.  3. Small amount of fluid within the first, second, and third  intermetatarsal spaces, which may reflect mild bursitis.    Electronically Signed    By: Davina Poke D.O.    On: 03/05/2022 15:39   ----  Objective: AAO x3, NAD DP/PT pulses palpable bilaterally, CRT  less than 3 seconds Discomfort mostly on the right dorsal midfoot on the dorsal lateral aspect as well.  There is no area pinpoint tenderness.  There is mild edema there is no erythema or warmth.  Flexor, extensor tendons clinically fairly intact.  MMT 5/5.  Negative Tinel sign. Hyperkeratotic tissue on the dorsal lateral aspect the right fifth toe that she does not want trimmed today.  There is no edema, erythema or signs of infection. 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  -Reviewed the MRI. -Steroid injection performed the dorsal aspect midfoot along the Lisfranc joint on the area of discomfort.  Skin was cleaned with alcohol and a mixture of 1 cc Kenalog 10, 0.5 cc of Marcaine plain, 0.5 cc of lidocaine plain was infiltrated into the area of maximal tenderness without complications.  Postinjection care discussed.  Tolerated well.  She has a cam boot at home in order to wear this for the next couple of days to help prevent postinjection complications. -Continue supportive shoe discussed stiffer soled shoes. -Discussed the corn of the fifth toe.  She does not want this trimmed today.  Discussed shoe modifications to avoid pressure. -Patient encouraged to call the office with any questions, concerns, change in symptoms.   Trula Slade DPM

## 2022-03-19 ENCOUNTER — Other Ambulatory Visit: Payer: Self-pay | Admitting: Family Medicine

## 2022-03-20 MED ORDER — OXYCODONE-ACETAMINOPHEN 10-325 MG PO TABS: ORAL_TABLET | ORAL | 0 refills | Status: DC

## 2022-04-02 ENCOUNTER — Telehealth: Payer: Self-pay

## 2022-04-02 ENCOUNTER — Encounter: Payer: Self-pay | Admitting: Family Medicine

## 2022-04-02 MED ORDER — OXYCODONE-ACETAMINOPHEN 10-325 MG PO TABS: ORAL_TABLET | ORAL | 0 refills | Status: DC

## 2022-04-02 NOTE — Telephone Encounter (Signed)
done

## 2022-04-03 ENCOUNTER — Ambulatory Visit (INDEPENDENT_AMBULATORY_CARE_PROVIDER_SITE_OTHER): Payer: BLUE CROSS/BLUE SHIELD | Admitting: Family Medicine

## 2022-04-03 VITALS — BP 146/68 | Ht 67.0 in | Wt 275.0 lb

## 2022-04-03 DIAGNOSIS — G894 Chronic pain syndrome: Secondary | ICD-10-CM

## 2022-04-03 NOTE — Progress Notes (Signed)
  Crystal Garza - 51 y.o. female MRN [id number]  Date of birth: 09/23/1970    SUBJECTIVE:      Chief Complaint:/ HPI:   Neck pain continues She is frustrated as nothing seems to make it better  [place] yesterday at work, landing on her knees. 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 It feels more stiff today but walking OK. Slightly swollen, Both knees a bit sore but left concerns her more due to hx TKR.  Discussion of chronic pain medication: has had a lot of trouble getting pharmacy that has stock to fill her rx. She is worried she will get "a mark against her name" if she calls to pharmacies to ask if they cn fill  it and she wasn't to know what to do.    OBJECTIVE: BP (!) 146/68   Ht '5\' 7"'$  (1.702 m)   Wt 275 lb (124.7 kg)   BMI 43.07 kg/m   Physical Exam:  Vital signs are reviewed. GEN WD WN NAD NECK ttp over bilateral but R>L trapezius muscles and significant spasm noted here. Palpation reproduces her pain. FROm. Negative spurlings. KNEES: symmetrical although very small swoledn (soft tissue) area over left superior lateral patella. Patella is without defect. FROm in extension (although she has some residual stiffness which is from prior TKR and she is at her baseline). Flexion non painful. Popliteal space is benign. Calf is soft. No effusion. Rigt knee normal exam. Gait normal Can rise from a chair without assistance  ASSESSMENT & PLAN: Chronic neck pain mostly related to chronic muscle tension. Discussed shoulder shrug and scapular retraction exercises with weighted milk jugs multiple times a day. Discussed stress relief. 2. Knee; I do not think she has done anything worrisome to her knee replacement. Will follow. She is reassured See problem based charting & AVS for pt instructions. Chronic pain Discussion re local suppply of opioids at pharmacies not sufficent and this will likely continue to be a problem. Reassured her it was OK to call various pharmacies to  find one who has supply and I will be happy to transfer her rx. She has never had any red flags for opioid abuse.  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 She has previously been at a pain clinic and does not wish [place] back.we discussed that may be necessaryi n future but for now will try to continue as currently.

## 2022-04-03 NOTE — Assessment & Plan Note (Signed)
Discussion re local suppply of opioids at pharmacies not sufficent and this will likely continue to be a problem. Reassured her it was OK to call various pharmacies to find one who has supply and I will be happy to transfer her rx. She has never had any red flags for opioid abuse.  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 She has previously been at a pain clinic and does not wish [place] back.we discussed that may be necessaryi n future but for now will try to continue as currently.

## 2022-04-10 ENCOUNTER — Other Ambulatory Visit: Payer: Self-pay | Admitting: Family Medicine

## 2022-04-10 DIAGNOSIS — F419 Anxiety disorder, unspecified: Secondary | ICD-10-CM

## 2022-04-23 ENCOUNTER — Other Ambulatory Visit: Payer: Self-pay | Admitting: Family Medicine

## 2022-04-24 ENCOUNTER — Other Ambulatory Visit: Payer: Self-pay | Admitting: Family Medicine

## 2022-04-27 ENCOUNTER — Other Ambulatory Visit: Payer: Self-pay

## 2022-04-27 ENCOUNTER — Emergency Department (HOSPITAL_COMMUNITY)
Admission: EM | Admit: 2022-04-27 | Discharge: 2022-04-27 | Disposition: A | Discharge status: Home / Self Care | Payer: BLUE CROSS/BLUE SHIELD | Attending: Emergency Medicine | Admitting: Emergency Medicine

## 2022-04-27 ENCOUNTER — Encounter (HOSPITAL_COMMUNITY): Payer: Self-pay

## 2022-04-27 DIAGNOSIS — Z7901 Long term (current) use of anticoagulants: Secondary | ICD-10-CM | POA: Diagnosis not present

## 2022-04-27 DIAGNOSIS — S24109A Unspecified injury at unspecified level of thoracic spinal cord, initial encounter: Secondary | ICD-10-CM | POA: Diagnosis present

## 2022-04-27 DIAGNOSIS — X58XXXA Exposure to other specified factors, initial encounter: Secondary | ICD-10-CM | POA: Insufficient documentation

## 2022-04-27 DIAGNOSIS — Z79899 Other long term (current) drug therapy: Secondary | ICD-10-CM | POA: Diagnosis not present

## 2022-04-27 DIAGNOSIS — S29012A Strain of muscle and tendon of back wall of thorax, initial encounter: Secondary | ICD-10-CM | POA: Diagnosis not present

## 2022-04-27 MED ORDER — METHOCARBAMOL 500 MG PO TABS: 500.0000 mg | ORAL_TABLET | Freq: Three times a day (TID) | ORAL | 0 refills | Status: DC | PRN

## 2022-04-27 NOTE — ED Triage Notes (Signed)
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

## 2022-04-27 NOTE — ED Provider Notes (Signed)
[facility] DEPT Provider Note   CSN: [id number] Arrival date & time: 04/27/22  1736     History  Chief Complaint  Patient presents with   Back Pain    Crystal Garza is a 51 y.o. female who presents to the emergency department with chief complaint of thoracic back pain.  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  It hurts worse when she moves twists bends.  It radiates to her lower back.  She denies any numbness tingling or weakness in the upper or lower extremities, no bowel or bladder incontinence.  She took Tylenol prior to arrival.  She has pain medication at home.   Back Pain      Home Medications Prior to Admission medications   Medication Sig Start Date End Date Taking? Authorizing Provider  methocarbamol (ROBAXIN) 500 MG tablet Take 1 tablet (500 mg total) by mouth 3 (three) times daily as needed for muscle spasms. 04/27/22  Yes Margarita Mail, PA-C  ALPRAZolam Duanne Moron) 1 MG tablet Take one tablet up to four times a day as needed for anxiety 04/10/22   Dickie La, MD  apixaban (ELIQUIS) 2.5 MG TABS tablet Take 1 tablet (2.5 mg total) by mouth 2 (two) times daily. 08/27/21   Dickie La, MD  ciclopirox (PENLAC) 8 % solution Apply topically at bedtime. Apply over nail and surrounding skin. Apply daily over previous coat. After seven (7) days, may remove with alcohol and continue cycle. 12/29/21   Trula Slade, DPM  DULoxetine (CYMBALTA) 20 MG capsule TAKE ONE CAPSULE BY MOUTH DAILY 01/30/22   Dickie La, MD  famotidine (PEPCID) 20 MG tablet Take 1 tablet (20 mg total) by mouth 2 (two) times daily. 04/07/21   Garnet Sierras, DO  lisinopril (ZESTRIL) 10 MG tablet TAKE 1 TABLET EACH DAY. 08/01/21   Dickie La, MD  methylPREDNISolone (MEDROL DOSEPAK) 4 MG TBPK tablet Take as directed 12/01/21   Trula Slade, DPM   nirmatrelvir & ritonavir (PAXLOVID, 150/100,) 10 x 150 MG & 10 x '100MG'$  TBPK Take 1 tablet by mouth 2 (two) times daily. Take by mouth twice daily 150 mg nirmatrelvir and 100 mg ritonavir for five days 01/06/22   Dickie La, MD  ondansetron (ZOFRAN-ODT) 4 MG disintegrating tablet Take ONE tablet (FOUR MG total) UNDER THE TONGUE OR by MOUTH TWO (two) times daily as needed FOR nausea OR vomiting. 10/08/21   Dickie La, MD  oxyCODONE-acetaminophen (PERCOCET) 10-325 MG tablet TAKE ONE TABLET BY MOUTH EVERY 4 TO 6 HOURS AS NEEDED CHRONIC PAIN 04/02/22   Dickie La, MD  pantoprazole (PROTONIX) 40 MG tablet Take 1 tablet (40 mg total) by mouth 2 (two) times daily. 08/14/21   Armbruster, Carlota Raspberry, MD  Plecanatide (TRULANCE) 3 MG TABS Take 3 mg by mouth daily. Lot: [id number] exp: 01-2023 09/02/21   Yetta Flock, MD  silver sulfADIAZINE (SILVADENE) 1 % cream Apply 1 application. topically 2 (two) times daily. 08/15/21   Dickie La, MD  solifenacin (VESICARE) 5 MG tablet Take 5 mg by mouth daily. 11/20/19   [provider]      Allergies    Codeine and Blain Pais allergy]    Review of Systems   Review of Systems  Musculoskeletal:  Positive for back pain.    Physical Exam Updated Vital Signs  BP (!) 149/91 (BP Location: Left Arm)   Pulse 96   Temp 98.7 F (37.1 C) (Oral)   Resp 18   Ht '5\' 7"'$  (1.702 m)   Wt 127 kg   SpO2 100%   BMI 43.85 kg/m  Physical Exam Vitals and nursing note reviewed.  Constitutional:      General: She is not in acute distress.    Appearance: She is well-developed. She is not diaphoretic.  HENT:     Head: Normocephalic and atraumatic.     Right Ear: External ear normal.     Left Ear: External ear normal.     Nose: Nose normal.     Mouth/Throat:     Mouth: Mucous membranes are moist.  Eyes:     General: No scleral icterus.    Conjunctiva/sclera: Conjunctivae normal.  Cardiovascular:     Rate and Rhythm: Normal rate and regular rhythm.     Heart  sounds: Normal heart sounds. No murmur heard.    No friction rub. No gallop.  Pulmonary:     Effort: Pulmonary effort is normal. No respiratory distress.     Breath sounds: Normal breath sounds.  Abdominal:     General: Bowel sounds are normal. There is no distension.     Palpations: Abdomen is soft. There is no mass.     Tenderness: There is no abdominal tenderness. There is no guarding.  Musculoskeletal:       Arms:     Cervical back: Normal range of motion.     Comments: Full range of motion of the upper and lower back as well as cervical spine, no midline tenderness, tender spastic tissue in the lower trapezius fibers consistent with muscle strain and spasm.  Normal strength in bilateral upper and lower extremities neurovascularly intact with equal grip strengths.  Skin:    General: Skin is warm and dry.  Neurological:     Mental Status: She is alert and oriented to person, place, and time.  Psychiatric:        Behavior: Behavior normal.    ED Results / Procedures / Treatments   Labs (all labs ordered are listed, but only abnormal results are displayed) Labs Reviewed - No data to display  EKG None  Radiology No results found.  Procedures Procedures    Medications Ordered in ED Medications - No data to display  ED Course/ Medical Decision Making/ A&P                           Medical Decision Making Patient with thoracic back strain.  Will discharge on Robaxin.  She has pain medication at home.  Otherwise she may use supportive care such as heat and ice.  Do not feel that she needs any imaging.  She appears otherwise appropriate for discharge is neurovascularly intact without signs or symptoms of severe injury such as fracture or pneumothorax.  She denies cough or shortness of breath.           Final Clinical Impression(s) / ED Diagnoses Final diagnoses:  Strain of thoracic back region    Rx / DC Orders ED Discharge Orders          Ordered     methocarbamol (ROBAXIN) 500 MG tablet  3 times daily PRN        04/27/22 1748              Margarita Mail, PA-C 04/27/22 1752    Trifan,  Carola Rhine, MD 04/27/22 Carollee Massed

## 2022-04-27 NOTE — Discharge Instructions (Signed)
Get help right away if you: Have shortness of breath. Have chest pain. Develop numbness or weakness in your legs or arms. Have involuntary loss of urine (urinary incontinence).

## 2022-04-28 ENCOUNTER — Telehealth: Payer: Self-pay | Admitting: *Deleted

## 2022-04-28 NOTE — Telephone Encounter (Signed)
Transition Care Management Unsuccessful Follow-up Telephone Call  Date of discharge and from where:  04/27/2022 [facility]  Attempts:  1st Attempt  Reason for unsuccessful TCM follow-up call:  Left voice message

## 2022-04-30 ENCOUNTER — Telehealth: Payer: Self-pay | Admitting: *Deleted

## 2022-04-30 ENCOUNTER — Telehealth (INDEPENDENT_AMBULATORY_CARE_PROVIDER_SITE_OTHER): Payer: BLUE CROSS/BLUE SHIELD | Admitting: *Deleted

## 2022-04-30 ENCOUNTER — Telehealth: Payer: Self-pay | Admitting: Family Medicine

## 2022-04-30 ENCOUNTER — Other Ambulatory Visit: Payer: Self-pay | Admitting: Allergy

## 2022-04-30 MED ORDER — OXYCODONE-ACETAMINOPHEN 10-325 MG PO TABS: ORAL_TABLET | ORAL | 0 refills | Status: DC

## 2022-04-30 NOTE — Telephone Encounter (Signed)
Transition Care Management Unsuccessful Follow-up Telephone Call  Date of discharge and from where:  04/27/[facility]  Attempts:  2nd Attempt  Reason for unsuccessful TCM follow-up call:  Left voice message

## 2022-04-30 NOTE — Telephone Encounter (Signed)
Third and final attempt to return after hours call. Phone line rang and then went to VM. Left VM indicating that I would no longer attempt to reach out and that if she still needed assistance from our after hours emergency line, she should call back and speak to the operator to make another request.   Ezequiel Essex, MD

## 2022-04-30 NOTE — Telephone Encounter (Signed)
AFTER HOURS EMERGENCY LINE  Attempted to return patient's call to the given phone number x2. No answer x2, VM left each time. I will round on hospital patients and attempt to call back after.   Ezequiel Essex, MD

## 2022-04-30 NOTE — Telephone Encounter (Signed)
refilled 

## 2022-05-01 ENCOUNTER — Telehealth: Payer: Self-pay | Admitting: *Deleted

## 2022-05-01 NOTE — Telephone Encounter (Signed)
Transition Care Management Follow-up Telephone Call Date of discharge and from where: 04/27/2022 [facility] How have you been since you were released from the hospital? She is not better she feels like legs are going numb Any questions or concerns? Yes concerned about legs going numb  Items Reviewed: Did the pt receive and understand the discharge instructions provided? Yes  Medications obtained and verified? Yes  Other? No  Any new allergies since your discharge? No  Dietary orders reviewed? Yes Do you have support at home? Yes   Functional Questionnaire: (I = Independent and D = Dependent) ADLs: i  Bathing/Dressing- i  Meal Prep- i  Eating- i  Maintaining continence- i  Transferring/Ambulation- i  Managing Meds- i  Follow up appointments reviewed:  PCP Hospital f/u appt confirmed? Yes  Scheduled to see [facility] on 05/06/2021 @ 8:50. Are transportation arrangements needed? No  If their condition worsens, is the pt aware to call PCP or go to the Emergency Dept.? Yes Was the patient provided with contact information for the PCP's office or ED? Yes Was to pt encouraged to call back with questions or concerns? Yes

## 2022-05-06 ENCOUNTER — Ambulatory Visit (INDEPENDENT_AMBULATORY_CARE_PROVIDER_SITE_OTHER): Payer: 59 | Admitting: Family Medicine

## 2022-05-06 ENCOUNTER — Encounter: Payer: Self-pay | Admitting: Family Medicine

## 2022-05-06 VITALS — BP 124/78 | HR 74 | Ht 67.0 in | Wt 286.0 lb

## 2022-05-06 DIAGNOSIS — N62 Hypertrophy of breast: Secondary | ICD-10-CM | POA: Diagnosis not present

## 2022-05-06 DIAGNOSIS — M549 Dorsalgia, unspecified: Secondary | ICD-10-CM

## 2022-05-06 DIAGNOSIS — M542 Cervicalgia: Secondary | ICD-10-CM | POA: Diagnosis not present

## 2022-05-06 DIAGNOSIS — G8929 Other chronic pain: Secondary | ICD-10-CM | POA: Diagnosis not present

## 2022-05-06 NOTE — Progress Notes (Signed)
    CHIEF COMPLAINT / HPI: Episode of severe mid to upper back pain.  Went to the emergency department.  They gave her Robaxin.  This has been recurrent.  Is a little better now.  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  Current pain medicine regimen seems to do little for the recurrent back issues she is having   PERTINENT  PMH / PSH: I have reviewed the patient's medications, allergies, past medical and surgical history, smoking status and updated in the EMR as appropriate. 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  Review of MR cervical spine 2020:Cervical spine spondylosis most notable at C4-C5 with severe right and mild left neural foraminal narrowing. Also at C5-C6 with a new central disc protrusion which causes mild central canal stenosis.  Review of lateral chest x-ray June 2023: My personal interpretation: Upper thoracic spine reveals some mild to moderate displaced height loss with endplate degenerative changes and osteophytes present in the upper thoracic spine. OBJECTIVE:  BP 124/78   Pulse 74   Ht '5\' 7"'$  (1.702 m)   Wt 286 lb (129.7 kg)   SpO2 99%   BMI 44.79 kg/m  Chart review reveals BMI 60.8 in 2019.  Currently BMI is 44.7.  12/08/2017  Weight /BMI   Weight 365 lb 8.4 oz (H)   Height   BMI 60.83 kg/m2      GENERAL: Well-developed female no acute distress NECK: Muscular tenderness to palpation in the paracervical muscles as well as the trapezius.  Full range of motion of the neck although she has somewhat tight musculature. BACK: Upper back in the mid rhomboid area she has several chronic pressure points that are tender to palpation.  ASSESSMENT / PLAN:   Chronic neck and back pain She has successfully lost from her all-time high weight of 365 down to today's current weight  of 244 improving her BMI from 60-44.7.  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  I think some of the issue is related to macromastia.  We had previously discussed evaluation for that but somehow the referral fell through.  I will make the referral again and have her seen and evaluated.  Hopefully can get an opinion from the surgeon whether or not this is contributing to her upper thoracic back pain.  I would like to see her back after that evaluation.  In the interim, we will continue current pain medicine regimen.   [name] MD

## 2022-05-06 NOTE — Assessment & Plan Note (Signed)
She has successfully lost from her all-time high weight of 365 down to today's current weight of 244 improving her BMI from 60-44.7.  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  I think some of the issue is related to macromastia.  We had previously discussed evaluation for that but somehow the referral fell through.  I will make the referral again and have her seen and evaluated.  Hopefully can get an opinion from the surgeon whether or not this is contributing to her upper thoracic back pain.  I would like to see her back after that evaluation.  In the interim, we will continue current pain medicine regimen.

## 2022-05-11 ENCOUNTER — Ambulatory Visit (INDEPENDENT_AMBULATORY_CARE_PROVIDER_SITE_OTHER): Payer: 59

## 2022-05-11 DIAGNOSIS — Z111 Encounter for screening for respiratory tuberculosis: Secondary | ICD-10-CM

## 2022-05-11 NOTE — Progress Notes (Signed)
Patient is here for a PPD placement.  PPD placed in left forearm @ 8:30 am.  Patient will return 05/13/22 to have PPD read. Talbot Grumbling, RN

## 2022-05-13 ENCOUNTER — Other Ambulatory Visit: Payer: Self-pay | Admitting: Allergy

## 2022-05-13 ENCOUNTER — Ambulatory Visit (INDEPENDENT_AMBULATORY_CARE_PROVIDER_SITE_OTHER): Payer: 59

## 2022-05-13 ENCOUNTER — Telehealth: Payer: Self-pay

## 2022-05-13 DIAGNOSIS — Z111 Encounter for screening for respiratory tuberculosis: Secondary | ICD-10-CM

## 2022-05-13 LAB — TB SKIN TEST
Induration: 0 mm
TB Skin Test: NEGATIVE

## 2022-05-13 NOTE — Telephone Encounter (Signed)
Patient in nurse clinic requesting order for future labs. She states that she has not had routine lab work in [employer].   She was seen by PCP on 05/06/22. Advised that patient may need additional appointment with PCP in order to receive orders for lab work.   Will forward request to PCP.   Talbot Grumbling, RN

## 2022-05-13 NOTE — Progress Notes (Signed)
Patient is here for a PPD read.  It was placed on 05/11/2022 in the left forearm @ 8:30 am.    PPD RESULTS:  Result: negative Induration: 0 mm  Letter created and given to patient for documentation purposes. Talbot Grumbling, RN

## 2022-05-13 NOTE — Telephone Encounter (Signed)
Returned call to patient. 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   Patient has concerns that her blood sugar levels will drop after meals. She states that prior to eating her blood sugar levels will be in the 120's and then after eating (1-2 hours), they will be in the 70's-80's. She will get cold sweats, will drink juice and then returns to normal.    Patient states that this has been going on for ~ 1 month.   Scheduled an appointment with Dr. Nori Riis for 2/7. Patient unable to come to office on 1/24- PCP next available.   Please advise if you would like patient to have lab work sooner. Return precautions discussed.  Talbot Grumbling, RN

## 2022-05-15 ENCOUNTER — Other Ambulatory Visit: Payer: Self-pay | Admitting: Allergy

## 2022-05-26 ENCOUNTER — Encounter: Payer: Self-pay | Admitting: Family Medicine

## 2022-05-26 MED ORDER — OXYCODONE-ACETAMINOPHEN 10-325 MG PO TABS: ORAL_TABLET | ORAL | 0 refills | Status: DC

## 2022-05-27 ENCOUNTER — Telehealth: Payer: Self-pay

## 2022-05-27 NOTE — Telephone Encounter (Signed)
-----  Message from Carolyne Littles sent at 05/27/2022 10:40 AM EST ----- Regarding: phone message Pt is asking for refill on oxycodone and states it needs prior auth. Her new insurance [employer] is scanned in epic.

## 2022-05-28 DIAGNOSIS — G8929 Other chronic pain: Secondary | ICD-10-CM | POA: Diagnosis not present

## 2022-05-28 DIAGNOSIS — N62 Hypertrophy of breast: Secondary | ICD-10-CM | POA: Diagnosis not present

## 2022-05-28 DIAGNOSIS — M542 Cervicalgia: Secondary | ICD-10-CM | POA: Diagnosis not present

## 2022-05-28 DIAGNOSIS — M549 Dorsalgia, unspecified: Secondary | ICD-10-CM | POA: Diagnosis not present

## 2022-05-28 NOTE — Telephone Encounter (Signed)
A Prior Authorization was initiated for this patients OXYCODONE-ACETAMINOPHEN through CoverMyMeds.   Key: [id number]

## 2022-05-28 NOTE — Telephone Encounter (Signed)
I have routed this message to [facility] to handle for prior authorization per Dr Valentina Lucks ma'am.

## 2022-05-28 NOTE — Telephone Encounter (Signed)
Dear Nyoka Cowden Team I have sent in refill. Will  the pharmacy automatically send a [employer] request? If not, how do I do that?  THANKS! [name]

## 2022-05-29 NOTE — Telephone Encounter (Signed)
Prior Auth for patients medication OXYCODONE-ACET approved by [employer] from 05/28/22 to 11/24/22.  CoverMyMeds Key: [id number] [place] Case ID #: [id number]

## 2022-06-10 ENCOUNTER — Encounter: Payer: Self-pay | Admitting: Family Medicine

## 2022-06-10 ENCOUNTER — Ambulatory Visit (INDEPENDENT_AMBULATORY_CARE_PROVIDER_SITE_OTHER): Payer: 59 | Admitting: Family Medicine

## 2022-06-10 VITALS — BP 140/82 | HR 81 | Ht 67.0 in | Wt 287.2 lb

## 2022-06-10 DIAGNOSIS — G894 Chronic pain syndrome: Secondary | ICD-10-CM

## 2022-06-10 DIAGNOSIS — Z23 Encounter for immunization: Secondary | ICD-10-CM

## 2022-06-10 DIAGNOSIS — R5383 Other fatigue: Secondary | ICD-10-CM | POA: Diagnosis not present

## 2022-06-10 DIAGNOSIS — R7309 Other abnormal glucose: Secondary | ICD-10-CM | POA: Diagnosis not present

## 2022-06-10 DIAGNOSIS — F413 Other mixed anxiety disorders: Secondary | ICD-10-CM | POA: Diagnosis not present

## 2022-06-10 LAB — POCT GLYCOSYLATED HEMOGLOBIN (HGB A1C): Hemoglobin A1C: 5.5 % (ref 4.0–5.6)

## 2022-06-10 MED ORDER — DULOXETINE HCL 30 MG PO CPEP: 30.0000 mg | ORAL_CAPSULE | Freq: Every day | ORAL | 3 refills | Status: DC

## 2022-06-10 NOTE — Patient Instructions (Signed)
I will send you the result of your blood work in [facility].  We have increased your Cymbalta to 30 mg and I sent in a new prescription.  You may start taking the new dose soon as you pick it up.  Do not forget to take both pills the so put the old 20 mg tabs somewhere in the back corner of the closet for now.  Great to see you!

## 2022-06-11 LAB — COMPREHENSIVE METABOLIC PANEL
ALT: 13 IU/L (ref 0–32)
AST: 16 IU/L (ref 0–40)
Albumin/Globulin Ratio: 1.3 (ref 1.2–2.2)
Albumin: 3.9 g/dL (ref 3.8–4.9)
Alkaline Phosphatase: 98 IU/L (ref 44–121)
BUN/Creatinine Ratio: 14 (ref 9–23)
BUN: 9 mg/dL (ref 6–24)
Bilirubin Total: 0.5 mg/dL (ref 0.0–1.2)
CO2: 20 mmol/L (ref 20–29)
Calcium: 9.1 mg/dL (ref 8.7–10.2)
Chloride: 105 mmol/L (ref 96–106)
Creatinine, Ser: 0.64 mg/dL (ref 0.57–1.00)
Globulin, Total: 3 g/dL (ref 1.5–4.5)
Glucose: 63 mg/dL — ABNORMAL LOW (ref 70–99)
Potassium: 4.3 mmol/L (ref 3.5–5.2)
Sodium: 143 mmol/L (ref 134–144)
Total Protein: 6.9 g/dL (ref 6.0–8.5)
eGFR: 106 mL/min/{1.73_m2} (ref >59)

## 2022-06-11 LAB — CBC
Hematocrit: 39.2 % (ref 34.0–46.6)
Hemoglobin: 12.7 g/dL (ref 11.1–15.9)
MCH: 26.3 pg — ABNORMAL LOW (ref 26.6–33.0)
MCHC: 32.4 g/dL (ref 31.5–35.7)
MCV: 81 fL (ref 79–97)
Platelets: 413 10*3/uL (ref 150–450)
RBC: 4.82 x10E6/uL (ref 3.77–5.28)
RDW: 13.5 % (ref 11.7–15.4)
WBC: 8.4 10*3/uL (ref 3.4–10.8)

## 2022-06-11 LAB — LIPID PANEL
Chol/HDL Ratio: 2.3 ratio (ref 0.0–4.4)
Cholesterol, Total: 161 mg/dL (ref 100–199)
HDL: 69 mg/dL (ref >39)
LDL Chol Calc (NIH): 79 mg/dL (ref 0–99)
Triglycerides: 68 mg/dL (ref 0–149)
VLDL Cholesterol Cal: 13 mg/dL (ref 5–40)

## 2022-06-11 LAB — TSH: TSH: 1.78 u[IU]/mL (ref 0.450–4.500)

## 2022-06-12 NOTE — Progress Notes (Signed)
    CHIEF COMPLAINT / HPI: #1.  Fatigue: She is also noticed that she has had a lot of episodes of low blood sugar.  A couple of pieces happened at work and she has had it checked with a glucometer at work and it has been in the 104s.  Fatigue seems more than her typical baseline.  Her stressors have not changed.  She is sleeping okay.  Does admit she is not probably eating as well as she should as she is skipping meals and eating on the run because she is so busy. 2.  Concerned that she may have diabetes that she is having some tingling and numbness in her extremities at times.  Mostly in her feet when she stands all day.  Goes away if she sits down. #3.  Chronic pain: That is doing pretty well on current medication regimen.  She is able to still work full-time working quite a bit of overtime, care for her granddaughter and helps care for her aunt and uncle. #4.  Anxiety: We had increased her dose of alprazolam a while back and it helped a little bit but she still anxious most of the time.  Feels like her chest will get tight and she will be little short of breath for a while until she can "talk herself down".  Wants to know if we could increase the dose more.   PERTINENT  PMH / PSH: I have reviewed the patient's medications, allergies, past medical and surgical history, smoking status and updated in the EMR as appropriate.   OBJECTIVE:  BP (!) 140/82   Pulse 81   Ht '5\' 7"'$  (1.702 m)   Wt 287 lb 3.2 oz (130.3 kg)   SpO2 98%   BMI 44.98 kg/m    ASSESSMENT / PLAN: Fatigue in low blood sugar: I think this is related more to improper diet and we discussed adding protein in every meal.  She admits she is not getting much protein.  She tries to eat as little as possible to maintain her weight loss.  Will check some labs.  A1c today.  Other mixed anxiety disorders She had done well for many years on chronic benzodiazepines.  I think all the stress with the failing health of her aunt and uncle in  addition to her full-time job and with her granddaughters intermittent sickle cell crises, she is just feeling overwhelmed.  I would not want to increase current dose if we can avoid it so we will instead increase her duloxetine to 30 mg.  Follow-up 1 month.  She is amenable to this plan.  Chronic pain Maintaining her current duties and full-time job as well as [place].  Continue current medication regimen.  No red flags.   [name] MD

## 2022-06-12 NOTE — Assessment & Plan Note (Signed)
She had done well for many years on chronic benzodiazepines.  I think all the stress with the failing health of her aunt and uncle in addition to her full-time job and with her granddaughters intermittent sickle cell crises, she is just feeling overwhelmed.  I would not want to increase current dose if we can avoid it so we will instead increase her duloxetine to 30 mg.  Follow-up 1 month.  She is amenable to this plan.

## 2022-06-12 NOTE — Assessment & Plan Note (Signed)
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  Continue current medication regimen.  No red flags.

## 2022-06-16 ENCOUNTER — Encounter: Payer: Self-pay | Admitting: Family Medicine

## 2022-06-19 ENCOUNTER — Other Ambulatory Visit: Payer: Self-pay | Admitting: Family Medicine

## 2022-06-22 MED ORDER — OXYCODONE-ACETAMINOPHEN 10-325 MG PO TABS: ORAL_TABLET | ORAL | 0 refills | Status: DC

## 2022-06-25 ENCOUNTER — Encounter: Payer: Self-pay | Admitting: Family Medicine

## 2022-07-06 ENCOUNTER — Ambulatory Visit (INDEPENDENT_AMBULATORY_CARE_PROVIDER_SITE_OTHER): Payer: 59 | Admitting: Family Medicine

## 2022-07-06 ENCOUNTER — Encounter: Payer: Self-pay | Admitting: Family Medicine

## 2022-07-06 ENCOUNTER — Ambulatory Visit
Admission: RE | Admit: 2022-07-06 | Discharge: 2022-07-06 | Disposition: A | Discharge status: Home / Self Care | Payer: 59 | Source: Ambulatory Visit | Attending: Family Medicine | Admitting: Family Medicine

## 2022-07-06 VITALS — BP 129/65 | HR 67 | Ht 67.0 in | Wt 280.2 lb

## 2022-07-06 DIAGNOSIS — I878 Other specified disorders of veins: Secondary | ICD-10-CM | POA: Diagnosis not present

## 2022-07-06 DIAGNOSIS — M545 Low back pain, unspecified: Secondary | ICD-10-CM | POA: Diagnosis not present

## 2022-07-06 DIAGNOSIS — M25551 Pain in right hip: Secondary | ICD-10-CM

## 2022-07-06 MED ORDER — KETOROLAC TROMETHAMINE 30 MG/ML IJ SOLN
30.0000 mg | Freq: Once | INTRAMUSCULAR | Status: AC
  Administered 2022-07-06: 30 mg via INTRAMUSCULAR

## 2022-07-06 NOTE — Progress Notes (Signed)
    SUBJECTIVE:   CHIEF COMPLAINT / HPI:   Back Pain  Right Hip Pain - Patient reports fall on Saturday 3/2 while getting out of the car - Initially fell on both of her knees and then when trying to get up her left knee gave out and she fell backwards - Has been having leg and back pain, back pain worse on the right - Is taking her pain medications as prescribed with they are not currently helping - Is not taking any more than her prescribed amount - Is able to ambulate, but reports that is very painful  PERTINENT  PMH / PSH: Left Knee TKA, history of gastric bypass  OBJECTIVE:   BP 129/65   Pulse 67   Ht '5\' 7"'$  (1.702 m)   Wt 280 lb 4 oz (127.1 kg)   SpO2 100%   BMI 43.89 kg/m   General: NAD, well-appearing, well-nourished Respiratory: No respiratory distress, breathing comfortably, able to speak in full sentences Skin: warm and dry, no rashes noted on exposed skin Psych: Appropriate affect and mood Right Hip:  - Inspection: No gross deformity, no swelling, erythema, or ecchymosis.  Lower back with paraspinal tenderness but nontender to palpation over the spinous processes - Palpation: TTP over the lateral hip (but not on the greater trochanter) - ROM: slightly decreased ROM in all directions secondary to pain - Strength: Normal strength. - Neuro/vasc: NV intact distally bilaterally - Special Tests: Pain with FABER and FADIR in the right hip   ASSESSMENT/PLAN:   Right hip pain Right hip pain present after fall 2 days ago that radiates towards back as well.  Has chronic pain being treated with Percocet 10-325 mg every 4-6 hours, patient reports not taking more than prescribed.  Has difficulty with ambulation secondary to this pain.  Given patient's habitus and degree of pain, will obtain x-rays to ensure no fractures present.  Discussed case with Dr. Nori Riis who is in agreement with x-rays and we will treat with Toradol shot in the clinic, preference to avoid any longer-term  NSAIDs given history of gastric bypass as well as chronic anticoagulation. - Bilateral pelvis with hip x-rays - Toradol 30 mg given in clinic - Strict return precautions given if unable to tolerate pain after 3 days - Encouraged conservative management and stretching/activity as tolerated     Rise Patience, [place]

## 2022-07-06 NOTE — Assessment & Plan Note (Signed)
Right hip pain present after fall 2 days ago that radiates towards back as well.  Has chronic pain being treated with Percocet 10-325 mg every 4-6 hours, patient reports not taking more than prescribed.  Has difficulty with ambulation secondary to this pain.  Given patient's habitus and degree of pain, will obtain x-rays to ensure no fractures present.  Discussed case with Dr. Nori Riis who is in agreement with x-rays and we will treat with Toradol shot in the clinic, preference to avoid any longer-term NSAIDs given history of gastric bypass as well as chronic anticoagulation. - Bilateral pelvis with hip x-rays - Toradol 30 mg given in clinic - Strict return precautions given if unable to tolerate pain after 3 days - Encouraged conservative management and stretching/activity as tolerated

## 2022-07-06 NOTE — Telephone Encounter (Signed)
Spoke with patient.   Patient scheduled for this morning for evaluation.

## 2022-07-06 NOTE — Patient Instructions (Addendum)
We are giving you a shot for an anti-inflammatory medication here in the office, hopefully this will help you over the next few days.    We are going to get x-rays of your hips and pelvis to make sure there is no concerns for any kind of fracture.  If everything on the x-rays looks good and over the next week if you are still having significant pain affecting your ability to function then I would recommend coming back into the office to be reevaluated.  These type of injuries can take a while to get over, I recommend doing activities/stretches as tolerated and make sure to keep moving the area to help improve the healing process.

## 2022-07-09 ENCOUNTER — Ambulatory Visit (INDEPENDENT_AMBULATORY_CARE_PROVIDER_SITE_OTHER): Payer: 59

## 2022-07-09 ENCOUNTER — Encounter: Payer: Self-pay | Admitting: Physician Assistant

## 2022-07-09 ENCOUNTER — Ambulatory Visit (INDEPENDENT_AMBULATORY_CARE_PROVIDER_SITE_OTHER): Payer: 59 | Admitting: Physician Assistant

## 2022-07-09 DIAGNOSIS — Z96652 Presence of left artificial knee joint: Secondary | ICD-10-CM

## 2022-07-09 DIAGNOSIS — M25561 Pain in right knee: Secondary | ICD-10-CM

## 2022-07-09 NOTE — Progress Notes (Signed)
Office Visit Note   Patient: Crystal Garza           Date of Birth: 1971/01/12           MRN: [id number] Visit Date: 07/09/2022              Requested by: Dickie La, MD [address] [place],  [place] [address] PCP: Dickie La, MD  Chief Complaint  Patient presents with   Right Knee - Pain   Left Knee - Pain      HPI: Crystal Garza is a pleasant 52 year old woman who is a patient of Dr. Trevor Mace.  She is status post left total knee arthroplasty in 2015.  She describes a fall she took when she was getting out of a car over the weekend.  She did fall onto the side of her knees.  She felt like her knee "shifted a little bit ".  She is also having pain in her right knee which is history of arthritis.  She wants to make sure she did not do any damage.  Assessment & Plan: Visit Diagnoses:  1. Hx of total knee replacement, left   2. Acute pain of right knee     Plan: X-rays are reassuring very similar to those she had a year ago.  She does not have an effusion in her knee is cool to touch both sides.  She has good stability with varus valgus testing and no tenderness over her stabilizing ligaments.  I have asked that she give this a couple week and treated symptomatically with Voltaren gel and Tylenol as needed.  She will call me back in 2 weeks if she is not improving  Follow-Up Instructions: Return if symptoms worsen or fail to improve.   Ortho Exam  Patient is alert, oriented, no adenopathy, well-dressed, normal affect, normal respiratory effort. On the left knee she has a well-healed surgical incision.  No effusion.  No warmth.  No ecchymosis.  She has full flexion and extension.  She has good stability with varus and valgus testing.  On the right knee again no effusion no redness no warmth she is neurovascular intact compartments of her lower extremity are intact she has good dorsiflexion plantarflexion of her ankles  Imaging: XR Knee 1-2 Views Right  Result Date:  07/09/2022 2 views of her right knee were obtained today.  She has joint space narrowing and periarticular osteophytes and sclerotic changes consistent with tricompartmental arthritis of her right knee no acute fractures noted well-maintained alignment  XR Knee 1-2 Views Left  Result Date: 07/09/2022 2 views of her left knee were reviewed today.  She is status post left total knee arthroplasty.  Components are in good position no evidence of dislocation very similar x-rays to those taken about a year ago.  No real evidence of periprosthetic fracture  No images are attached to the encounter.  Labs: Lab Results  Component Value Date   HGBA1C 5.5 06/10/2022   HGBA1C 6.1 11/04/2011   ESRSEDRATE 43 (H) 07/24/2021   ESRSEDRATE 84 (H) 04/04/2021   ESRSEDRATE 86 (H) 01/28/2021   CRP 6 04/04/2021   CRP 5 11/13/2020   CRP 18.2 (H) 03/31/2017   LABURIC 4.4 06/24/2021   REPTSTATUS 11/19/2016 FINAL 11/16/2016   CULT >=100,000 COLONIES/mL KLEBSIELLA PNEUMONIAE (A) 11/16/2016   LABORGA KLEBSIELLA PNEUMONIAE (A) 11/16/2016     Lab Results  Component Value Date   ALBUMIN 3.9 06/10/2022   ALBUMIN 3.8 04/04/2021   ALBUMIN 4.0  11/13/2020    No results found for: "MG" No results found for: "VD25OH"  No results found for: "PREALBUMIN"    Latest Ref Rng & Units 06/10/2022   12:01 PM 10/05/2021    9:07 AM 06/10/2021    3:26 AM  CBC EXTENDED  WBC 3.4 - 10.8 x10E3/uL 8.4  9.4  17.6   RBC 3.77 - 5.28 x10E6/uL 4.82  4.58  4.54   Hemoglobin 11.1 - 15.9 g/dL 12.7  11.8  12.1   HCT 34.0 - 46.6 % 39.2  40.5  40.2   Platelets 150 - 450 x10E3/uL 413  413  408      There is no height or weight on file to calculate BMI.  Orders:  Orders Placed This Encounter  Procedures   XR Knee 1-2 Views Left   XR Knee 1-2 Views Right   No orders of the defined types were placed in this encounter.    Procedures: No procedures performed  Clinical Data: No additional findings.  ROS:  All other systems  negative, except as noted in the HPI. Review of Systems  Objective: Vital Signs: There were no vitals taken for this visit.  Specialty Comments:  No specialty comments available.  PMFS History: Patient Active Problem List   Diagnosis Date Noted   Right hip pain 07/06/2022   Primary osteoarthritis of both feet 07/24/2021   DDD (degenerative disc disease), cervical 07/24/2021   Arthropathy of thoracic facet joint 07/24/2021   Fibromyalgia 07/24/2021   Primary insomnia 07/24/2021   S/P abdominal  panniculectomy 06/09/2021   Chronic rhinitis 11/28/2020   Hx of food allergy 09/13/2020   Healthcare maintenance 09/13/2020   Constipation due to pain medication 03/06/2020   Cervical radiculopathy at C6 03/29/2019   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   History of pulmonary embolism 05/25/2017   Esophageal dysphagia    Family history of colon cancer    Macromastia 02/27/2015   Chronic neck and back pain 02/27/2015   Chronic anticoagulation 05/17/2014   Degenerative arthritis of left knee 10/06/2013   Status post total knee replacement 10/06/2013   Chronic pain 06/13/2013   Lap Sleeve Gastrectomy May 2014 09/16/2012   Gastrointestinal stromal tumor (GIST) of stomach ([place]) 02/17/2012   Nephrolithiasis 02/20/2011   Other mixed anxiety disorders 06/27/2010   Migraine 09/12/2009   Morbid obesity-BMI 62 10/30/2008   Major depressive disorder, recurrent episode ([place]) 07/01/2006   HYPERTENSION, BENIGN SYSTEMIC 07/01/2006   Gastroesophageal reflux disease 07/01/2006   Past Medical History:  Diagnosis Date   Anemia    iron def   Anginal pain ([place])    Anxiety    Arthritis    left knee    Back pain    Benign gastrointestinal stromal tumor (GIST)    Chronic knee pain    Depression    DVT (deep venous thrombosis) ([facility])    Esophageal dysmotility    GERD (gastroesophageal reflux disease)    Hiatal hernia    History of kidney stones    Hx of laparoscopic  gastric banding    Hypertension    Internal hemorrhoids    Internal hemorrhoids    Migraine    Morbid obesity ([place])    PE (pulmonary embolism)     Family History  Problem Relation Age of Onset   Diabetes Mother    Diabetes Father    Heart disease Father    Colon cancer Father        brain tumor  Colon polyps Father    Colitis Father    Irritable bowel syndrome Father    Fibromyalgia Sister    Hypertension Sister    Diabetes Maternal Aunt    Diabetes Maternal Uncle    Diabetes Paternal Aunt    Heart disease Paternal Uncle    Diabetes Paternal Uncle    Diabetes Maternal Grandmother    Diabetes Maternal Grandfather    Schizophrenia Maternal Grandfather    Heart disease Paternal Grandmother    Heart disease Paternal Grandfather    Cancer Other    Hypertension Other    Healthy Son    Healthy Son    Diabetes Daughter        PRE-DIABETES   Esophageal cancer Neg Hx     Past Surgical History:  Procedure Laterality Date   34 HOUR [place] STUDY N/A 05/13/2016   Procedure: 24 HOUR [place] STUDY;  Surgeon: Manus Gunning, MD;  Location: [facility];  Service: Gastroenterology;  Laterality: N/A;   BIOPSY  02/16/2012   Procedure: BIOPSY;  Surgeon: [name], MD;  Location: [facility];  Service: General;;  biopsy of mass x 2   BREATH TEK H PYLORI  07/28/2011   Procedure: BREATH TEK H PYLORI;  Surgeon: [name], MD;  Location: [facility];  Service: General;  Laterality: N/A;  to be done at [facility]  05/1991   COLONOSCOPY WITH PROPOFOL N/A 03/03/2016   Procedure: COLONOSCOPY WITH PROPOFOL;  Surgeon: Manus Gunning, MD;  Location: [facility];  Service: Gastroenterology;  Laterality: N/A;   ESOPHAGEAL MANOMETRY N/A 05/13/2016   Procedure: ESOPHAGEAL MANOMETRY (EM);  Surgeon: Manus Gunning, MD;  Location: [facility];  Service: Gastroenterology;  Laterality: N/A;   ESOPHAGOGASTRODUODENOSCOPY N/A 08/29/2012   Procedure:  ESOPHAGOGASTRODUODENOSCOPY (EGD);  Surgeon: [name], MD;  Location: [facility];  Service: General;  Laterality: N/A;   ESOPHAGOGASTRODUODENOSCOPY N/A 09/04/2014   Procedure: ESOPHAGOGASTRODUODENOSCOPY (EGD);  Surgeon: Alphonsa Overall, MD;  Location: [facility];  Service: General;  Laterality: N/A;   ESOPHAGOGASTRODUODENOSCOPY (EGD) WITH PROPOFOL N/A 03/03/2016   Procedure: ESOPHAGOGASTRODUODENOSCOPY (EGD) WITH PROPOFOL;  Surgeon: Manus Gunning, MD;  Location: [facility];  Service: Gastroenterology;  Laterality: N/A;   EUS  03/03/2012   Procedure: UPPER ENDOSCOPIC ULTRASOUND (EUS) LINEAR;  Surgeon: Milus Banister, MD;  Location: [facility];  Service: Endoscopy;  Laterality: N/A;   GASTRIC ROUX-EN-Y N/A 12/07/2017   Procedure: LAPAROSCOPIC ROUX-EN-Y GASTRIC BYPASS WITH LYSIS OF ADHESIONS AND UPPER ENDOSCOPY ERAS Pathway;  Surgeon: Johnathan Hausen, MD;  Location: [facility];  Service: General;  Laterality: N/A;   gist     KNEE ARTHROSCOPY     LAPAROSCOPIC GASTRECTOMY  04/08/2012   Procedure: LAPAROSCOPIC GASTRECTOMY;  Surgeon: [name], MD;  Location: [facility];  Service: General;;  removal of GIST tumor of stomach   LAPAROSCOPIC GASTRIC SLEEVE RESECTION N/A 08/29/2012   Procedure: LAPAROSCOPIC GASTRIC SLEEVE RESECTION;  Surgeon: [name], MD;  Location: [facility];  Service: General;  Laterality: N/A;  Sleeve Gastrectomy   LEFT HEART CATH AND CORONARY ANGIOGRAPHY N/A 05/28/2017   Procedure: LEFT HEART CATH AND CORONARY ANGIOGRAPHY;  Surgeon: Leonie Man, MD;  Location: [facility];  Service: Cardiovascular;  Laterality: N/A;   PANNICULECTOMY N/A 06/09/2021   Procedure: PANNICULECTOMY;  Surgeon: Johnathan Hausen, MD;  Location: [facility];  Service: General;  Laterality: N/A;   ROUX-EN-Y GASTRIC BYPASS     TOTAL ABDOMINAL HYSTERECTOMY  04/2004   TOTAL  KNEE ARTHROPLASTY Left 10/06/2013   Procedure: LEFT TOTAL KNEE ARTHROPLASTY;  Surgeon: Mcarthur Rossetti, MD;   Location: [facility];  Service: Orthopedics;  Laterality: Left;   TUBAL LIGATION     Social History   Occupational History   Occupation: CNA , does Home Health  Tobacco Use   Smoking status: Never    Passive exposure: Never   Smokeless tobacco: Never  Vaping Use   Vaping Use: Never used  Substance and Sexual Activity   Alcohol use: No    Alcohol/week: 0.0 standard drinks of alcohol   Drug use: No   Sexual activity: Yes    Birth control/protection: Surgical

## 2022-07-10 ENCOUNTER — Other Ambulatory Visit: Payer: Self-pay | Admitting: Family Medicine

## 2022-07-10 MED ORDER — CYCLOBENZAPRINE HCL 10 MG PO TABS: 10.0000 mg | ORAL_TABLET | Freq: Three times a day (TID) | ORAL | 0 refills | Status: DC | PRN

## 2022-07-21 ENCOUNTER — Other Ambulatory Visit: Payer: Self-pay | Admitting: Family Medicine

## 2022-07-22 ENCOUNTER — Other Ambulatory Visit: Payer: Self-pay | Admitting: Family Medicine

## 2022-07-22 MED ORDER — OXYCODONE-ACETAMINOPHEN 10-325 MG PO TABS: ORAL_TABLET | ORAL | 0 refills | Status: DC

## 2022-08-03 ENCOUNTER — Other Ambulatory Visit: Payer: Self-pay | Admitting: Family Medicine

## 2022-08-07 ENCOUNTER — Other Ambulatory Visit: Payer: Self-pay | Admitting: Family Medicine

## 2022-08-07 DIAGNOSIS — F419 Anxiety disorder, unspecified: Secondary | ICD-10-CM

## 2022-08-17 ENCOUNTER — Other Ambulatory Visit: Payer: Self-pay | Admitting: Family Medicine

## 2022-08-17 MED ORDER — OXYCODONE-ACETAMINOPHEN 10-325 MG PO TABS: ORAL_TABLET | ORAL | 0 refills | Status: DC

## 2022-08-18 ENCOUNTER — Other Ambulatory Visit (HOSPITAL_COMMUNITY): Payer: Self-pay

## 2022-08-18 MED ORDER — CYCLOBENZAPRINE HCL 10 MG PO TABS
10.0000 mg | ORAL_TABLET | Freq: Three times a day (TID) | ORAL | 0 refills | Status: DC | PRN
  Filled 2022-08-18: qty 30, 10d supply, fill #0

## 2022-08-21 ENCOUNTER — Ambulatory Visit (INDEPENDENT_AMBULATORY_CARE_PROVIDER_SITE_OTHER): Payer: 59 | Admitting: Family Medicine

## 2022-08-21 VITALS — BP 140/69 | HR 73 | Temp 97.7°F | Ht 67.0 in | Wt 280.6 lb

## 2022-08-21 DIAGNOSIS — M79671 Pain in right foot: Secondary | ICD-10-CM | POA: Diagnosis not present

## 2022-08-21 DIAGNOSIS — M79672 Pain in left foot: Secondary | ICD-10-CM | POA: Diagnosis not present

## 2022-08-21 MED ORDER — DICLOFENAC SODIUM 1 % EX GEL: 4.0000 g | Freq: Four times a day (QID) | CUTANEOUS | Status: DC

## 2022-08-21 NOTE — Progress Notes (Signed)
SUBJECTIVE:   CHIEF COMPLAINT / HPI:  Chief Complaint  Patient presents with   Leg Pain    Pt presents today with right leg,ankle and foot pain that she stated started 2 weeks ago.    Patient reports that she has had pain primarily in the dorsum of the right foot which radiates to the lateral side of her ankle for about 2 weeks. She reports some numbness and tingling to the area. Pain is worse with plantarflexion of the foot. No known injury.  Has had difficulty ambulating. She reports chronic back pain, sometimes does have radicular symptoms down the right leg.  She reports she has not had any back pain concurrent with the foot pain.  Works as a Lawyer.  PERTINENT  PMH / PSH: HTN, obesity, fibromyalgia  Patient Care Team: Nestor Ramp, MD as PCP - General (Family Medicine) Prince Rome, Casimiro Needle, MD Park Liter, DPM (Inactive) as Consulting Physician (Podiatry) Armbruster, Willaim Rayas, MD as Consulting Physician (Gastroenterology)   OBJECTIVE:   BP (!) 140/69   Pulse 73   Temp 97.7 F (36.5 C)   Ht  (1.702 m)   Wt 280 lb 9.6 oz (127.3 kg)   SpO2 100%   BMI 43.95 kg/m   Physical Exam Constitutional:      General: She is not in acute distress. Cardiovascular:     Rate and Rhythm: Normal rate and regular rhythm.  Pulmonary:     Effort: Pulmonary effort is normal. No respiratory distress.     Breath sounds: Normal breath sounds.  Musculoskeletal:     Cervical back: Neck supple.     Comments: Right ankle with a small amount of swelling.  She has some mild tenderness to palpation along the dorsum of the right foot.  No ankle tenderness.  No tenderness at the base of the fifth metatarsal, navicular region, or lateral malleolus.  Full ROM with plantarflexion.  She does have decreased strength and ROM with dorsiflexion secondary to pain.  No calf tenderness or swelling.  2+ pedal pulses.  She has some tenderness to palpation along the midline lumbar spine.  Straight leg raise  is negative bilaterally.  Neurological:     Mental Status: She is alert.         08/21/2022   10:23 AM  Depression screen PHQ 2/9  Decreased Interest 0  Down, Depressed, Hopeless 0  PHQ - 2 Score 0  Altered sleeping 0  Tired, decreased energy 1  Change in appetite 0  Feeling bad or failure about yourself  0  Trouble concentrating 0  Moving slowly or fidgety/restless 0  Suicidal thoughts 0  PHQ-9 Score 1  Difficult doing work/chores Not difficult at all     {Show previous vital signs (optional):23777}    ASSESSMENT/PLAN:   1. Right foot pain Patient presents with 2 weeks of atraumatic right foot pain with numbness and tingling.  Based on exam and distribution of pain, this could be peroneal neuropathy or tendinopathy though exam is somewhat limited by pain.  Given numbness and tingling, will obtain lab work to evaluate for metabolic causes of peripheral neuropathy-recent A1c and TSH unremarkable so no need to repeat.  Will treat symptomatically with topical NSAID, I do think she would benefit from further evaluation with ultrasound with sports medicine. - Ambulatory referral to Sports Medicine - diclofenac Sodium (VOLTAREN) 1 % GEL; Apply 4 g topically 4 (four) times daily.  Dispense: 100 g - Vitamin B12 - RPR  Return if symptoms worsen or fail to improve.   Littie Deedsichard Denzel Etienne, MD [facility]

## 2022-08-21 NOTE — Patient Instructions (Addendum)
It was nice seeing you today!  I am referring you to sports medicine.  You can use Voltaren gel 4 times a day.  Stay well, Littie Deeds, MD [facility] [phone]  --  Make sure to check out at the front desk before you leave today.  Please arrive at least 15 minutes prior to your scheduled appointments.  If you had blood work today, I will send you a MyChart message or a letter if results are normal. Otherwise, I will give you a call.  If you had a referral placed, they will call you to set up an appointment. Please give [name] a call if you don't hear back in the next 2 weeks.  If you need additional refills before your next appointment, please call your pharmacy first.

## 2022-08-22 LAB — RPR: RPR Ser Ql: NONREACTIVE

## 2022-08-22 LAB — VITAMIN B12: Vitamin B-12: 299 pg/mL (ref 232–1245)

## 2022-08-23 ENCOUNTER — Encounter: Payer: Self-pay | Admitting: Family Medicine

## 2022-08-28 ENCOUNTER — Encounter: Payer: Self-pay | Admitting: Family Medicine

## 2022-08-28 ENCOUNTER — Other Ambulatory Visit (HOSPITAL_COMMUNITY): Payer: Self-pay

## 2022-08-31 ENCOUNTER — Ambulatory Visit (INDEPENDENT_AMBULATORY_CARE_PROVIDER_SITE_OTHER): Payer: 59 | Admitting: Podiatry

## 2022-08-31 ENCOUNTER — Ambulatory Visit (INDEPENDENT_AMBULATORY_CARE_PROVIDER_SITE_OTHER): Payer: 59

## 2022-08-31 DIAGNOSIS — M19071 Primary osteoarthritis, right ankle and foot: Secondary | ICD-10-CM | POA: Diagnosis not present

## 2022-08-31 DIAGNOSIS — S92354A Nondisplaced fracture of fifth metatarsal bone, right foot, initial encounter for closed fracture: Secondary | ICD-10-CM | POA: Diagnosis not present

## 2022-08-31 DIAGNOSIS — M205X1 Other deformities of toe(s) (acquired), right foot: Secondary | ICD-10-CM | POA: Diagnosis not present

## 2022-08-31 DIAGNOSIS — M778 Other enthesopathies, not elsewhere classified: Secondary | ICD-10-CM

## 2022-08-31 MED ORDER — METHYLPREDNISOLONE 4 MG PO TBPK: ORAL_TABLET | ORAL | 0 refills | Status: DC

## 2022-08-31 NOTE — Patient Instructions (Signed)
For instructions on how to put on your Tri-Lock Ankle Brace, please visit [email or web address] 

## 2022-08-31 NOTE — Progress Notes (Signed)
Subjective: Chief Complaint  Patient presents with   Foot Pain    Flare up capsulitis right foot   52 year old female presents the office today with above concerns.  She states that she has had a flareup of capsulitis over the last month and is becoming worse since she had some swelling.  No injuries that she reports.  Objective: AAO x3, NAD DP/PT pulses palpable bilaterally, CRT less than 3 seconds Discomfort on dorsal aspect of the foot as well as the anterior aspect ankle.  There is no specific area of pinpoint tenderness.  Flexor, extensor tendons are intact.  There is trace edema.  There is no erythema or warmth.  MMT 5/5. No open lesions or pre-ulcerative lesions.  No pain with calf compression, swelling, warmth, erythema  Assessment: Capsulitis Plan: -All treatment options discussed with the patient including all alternatives, risks, complications.  -X-rays were obtained reviewed.  3 views of the ankle and 3 views of the foot were obtained.  No definitive evidence of acute fracture.  Ankle joint space maintained. -Medrol dose pack -Trial of immobilization dispensed for immobilization to help support stabilize.  This was to facilitate healing. -Ordered blood work including HLA-B27, ANA, CRP, sed rate, uric acid, RF.  -MRI if no improvement  -Patient encouraged to call the office with any questions, concerns, change in symptoms.   Vivi Barrack DPM

## 2022-09-01 DIAGNOSIS — M19071 Primary osteoarthritis, right ankle and foot: Secondary | ICD-10-CM | POA: Diagnosis not present

## 2022-09-02 LAB — HLA-B27 ANTIGEN

## 2022-09-04 ENCOUNTER — Ambulatory Visit (INDEPENDENT_AMBULATORY_CARE_PROVIDER_SITE_OTHER): Payer: 59 | Admitting: Family Medicine

## 2022-09-04 VITALS — BP 122/52 | Ht 67.0 in | Wt 280.0 lb

## 2022-09-04 DIAGNOSIS — G8929 Other chronic pain: Secondary | ICD-10-CM

## 2022-09-04 DIAGNOSIS — M542 Cervicalgia: Secondary | ICD-10-CM | POA: Diagnosis not present

## 2022-09-04 DIAGNOSIS — M25571 Pain in right ankle and joints of right foot: Secondary | ICD-10-CM

## 2022-09-04 DIAGNOSIS — M5412 Radiculopathy, cervical region: Secondary | ICD-10-CM | POA: Diagnosis not present

## 2022-09-04 DIAGNOSIS — G4486 Cervicogenic headache: Secondary | ICD-10-CM | POA: Diagnosis not present

## 2022-09-04 MED ORDER — METHYLPREDNISOLONE ACETATE 40 MG/ML IJ SUSP
40.0000 mg | Freq: Once | INTRAMUSCULAR | Status: AC
Start: 2022-09-04 — End: 2022-09-04
  Administered 2022-09-04: 40 mg via INTRA_ARTICULAR

## 2022-09-04 NOTE — Progress Notes (Signed)
  Crystal Garza - 52 y.o. female MRN [id number]  Date of birth: [birth year]    SUBJECTIVE:      Chief Complaint:/ HPI:    #1 right ankle pain.  Suddenly got much worse about 10 days ago.  Has been having trouble standing or walking on it.  Saw her podiatrist who did an x-ray and placed her in a brace.  He also gave her oral steroids but then she remembered she is not supposed to take those secondary to her history of stomach surgery.  She comes in today for further evaluation and management. 2.  Also wants to discuss reevaluating her headaches.  She continues to get 4-5 headaches per week.  They mostly come on from the posterior aspect and seem to radiate from the upper neck.  In the past she had MRI about 4 5 years ago.  She wonders if it is time to do something different about her neck.   OBJECTIVE: BP (!) 122/52   Ht 5\' 7"  (1.702 m)   Wt 280 lb (127 kg)   BMI 43.85 kg/m   Physical Exam:  Vital signs are reviewed. GENERAL: Well-developed female no acute distress ANKLE: Right.  Mildly tender to palpation in the anterior area with some small amount of soft tissue swelling.  No true ankle effusion.  She has normal plantar and dorsiflexion normal strength.  Some pain with resisted plantarflexion dorsiflexion.  The foot has chronic loss of longitudinal arch.  Squeeze test of the distal tibia/fibula negative.  I reviewed the x-rays of her ankle.  She has some mild DJD. NECK: Mild muscle spasm bilaterally in the trapezius and the posterior neck muscles.  She has full range of motion flexion extension.  Positive Spurling's. PROCEDURE: INJECTION: Patient was given informed consent, signed copy in the chart. Appropriate time out was taken. Area prepped and draped in usual sterile fashion. Ethyl chloride was  used for local anesthesia. A 21 gauge 1 1/2 inch needle was used..  1 cc of methylprednisolone 40 mg/ml plus one half cc of 1% lidocaine without epinephrine +1/2 cc of bupivacaine was  injected into the right ankle joint using a(n) anterior approach.   The patient tolerated the procedure well. There were no complications. Post procedure instructions were given.   ASSESSMENT & PLAN:  See problem based charting & AVS for pt instructions. No problem-specific Assessment & Plan notes found for this encounter. #1.  Right ankle pain.  I agree with the podiatrist that this is likely capsular inflammation.  Unfortunately she should not be taking oral steroids.  We discussed as an alternative today I injected her intra-articular ankle joint.  Continue brace.  Follow-up 2 to 3 weeks with me.  Standing only as needed.  Follow-up sooner if it gets worse. 2.  Chronic cervicalgia and cervicogenic headache.  I reviewed her old MRI.  I do think it is time to update that.  Will order and follow-up after she has that performed.

## 2022-09-08 ENCOUNTER — Encounter: Payer: Self-pay | Admitting: Podiatry

## 2022-09-08 LAB — ANA: Anti Nuclear Antibody (ANA): NEGATIVE

## 2022-09-08 LAB — SEDIMENTATION RATE: Sed Rate: 80 mm/hr — ABNORMAL HIGH (ref 0–40)

## 2022-09-08 LAB — URIC ACID: Uric Acid: 4.3 mg/dL (ref 3.0–7.2)

## 2022-09-08 LAB — RHEUMATOID FACTOR: Rheumatoid fact SerPl-aCnc: <10 IU/mL (ref <14.0)

## 2022-09-08 LAB — C-REACTIVE PROTEIN: CRP: 5 mg/L (ref 0–10)

## 2022-09-10 ENCOUNTER — Other Ambulatory Visit: Payer: Self-pay | Admitting: Podiatry

## 2022-09-10 DIAGNOSIS — M19071 Primary osteoarthritis, right ankle and foot: Secondary | ICD-10-CM

## 2022-09-11 ENCOUNTER — Emergency Department (HOSPITAL_COMMUNITY): Payer: 59

## 2022-09-11 ENCOUNTER — Emergency Department (HOSPITAL_COMMUNITY)
Admission: EM | Admit: 2022-09-11 | Discharge: 2022-09-11 | Disposition: A | Discharge status: Home / Self Care | Payer: 59 | Attending: Emergency Medicine | Admitting: Emergency Medicine

## 2022-09-11 ENCOUNTER — Other Ambulatory Visit: Payer: Self-pay

## 2022-09-11 ENCOUNTER — Ambulatory Visit
Admission: RE | Admit: 2022-09-11 | Discharge: 2022-09-11 | Disposition: A | Discharge status: Home / Self Care | Payer: 59 | Source: Ambulatory Visit | Attending: Family Medicine | Admitting: Family Medicine

## 2022-09-11 DIAGNOSIS — M542 Cervicalgia: Secondary | ICD-10-CM | POA: Diagnosis not present

## 2022-09-11 DIAGNOSIS — R519 Headache, unspecified: Secondary | ICD-10-CM | POA: Insufficient documentation

## 2022-09-11 DIAGNOSIS — Z7901 Long term (current) use of anticoagulants: Secondary | ICD-10-CM | POA: Insufficient documentation

## 2022-09-11 DIAGNOSIS — J069 Acute upper respiratory infection, unspecified: Secondary | ICD-10-CM

## 2022-09-11 DIAGNOSIS — B9789 Other viral agents as the cause of diseases classified elsewhere: Secondary | ICD-10-CM | POA: Diagnosis not present

## 2022-09-11 DIAGNOSIS — M5412 Radiculopathy, cervical region: Secondary | ICD-10-CM

## 2022-09-11 DIAGNOSIS — G8929 Other chronic pain: Secondary | ICD-10-CM

## 2022-09-11 DIAGNOSIS — G4486 Cervicogenic headache: Secondary | ICD-10-CM

## 2022-09-11 DIAGNOSIS — R0981 Nasal congestion: Secondary | ICD-10-CM | POA: Diagnosis present

## 2022-09-11 DIAGNOSIS — R059 Cough, unspecified: Secondary | ICD-10-CM | POA: Diagnosis not present

## 2022-09-11 NOTE — Discharge Instructions (Addendum)
Your chest x-ray was without any abnormal findings.  As we discussed, your symptoms are consistent with a viral illness.  I have discussed the treatment for the symptoms below.  You are already doing many of these things.  Often these illnesses are self-limited and will resolve with time.  Monitor your symptoms and follow-up with your primary care doctor.  viral Illness TREATMENT  Treatment is directed at relieving symptoms. There is no cure. Antibiotics are not effective, because the infection is caused by a virus, not by bacteria. Treatment may include:  Increased fluid intake. Sports drinks offer valuable electrolytes, sugars, and fluids.  Breathing heated mist or steam (vaporizer or shower).  Eating chicken soup or other clear broths, and maintaining good nutrition.  Getting plenty of rest.  Using gargles or lozenges for comfort.  Increasing usage of your inhaler if you have asthma.  Return to work when your temperature has returned to normal.  Gargle warm salt water and spit it out for sore throat. Take benadryl to decrease sinus secretions. Continue to alternate between Tylenol and ibuprofen for pain and fever control.  Follow Up: Follow up with your primary care doctor in 5-7 days for recheck of ongoing symptoms.  Return to emergency department for emergent changing or worsening of symptoms.

## 2022-09-11 NOTE — ED Triage Notes (Signed)
Pt arrives c/o nasal congestion and cough x1 week. Pt also states that she has been having headaches and neck pain x1.5 weeks - MRI scheduled for today, 09/11/22, for evaluation of headaches. States that she has been taking zyrtec, mucinex, tylenol and flonase without relief. 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

## 2022-09-11 NOTE — ED Notes (Signed)
Pt transported to xray 

## 2022-09-11 NOTE — ED Provider Notes (Signed)
[place] EMERGENCY DEPARTMENT AT [facility] Provider Note   CSN: [id number] Arrival date & time: 09/11/22  0009     History  Chief Complaint  Patient presents with   Nasal Congestion   Headache    Crystal Garza is a 52 y.o. female.   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  She denies any fevers.  She states that she has chronic neck and headache pain.  She denies any chest pain or difficulty breathing no nausea or vomiting.     Home Medications Prior to Admission medications   Medication Sig Start Date End Date Taking? Authorizing Provider  ALPRAZolam Prudy Feeler) 1 MG tablet Take one tablet up to four times a day as needed for anxiety 08/07/22  Yes Nestor Ramp, MD  apixaban (ELIQUIS) 2.5 MG TABS tablet Take 1 tablet (2.5 mg total) by mouth 2 (two) times daily. 08/27/21  Yes Nestor Ramp, MD  diclofenac Sodium (VOLTAREN) 1 % GEL Apply 4 g topically 4 (four) times daily. 08/21/22  Yes Littie Deeds, MD  DULoxetine (CYMBALTA) 20 MG capsule Take 20 mg by mouth daily.   Yes [provider]  lisinopril (ZESTRIL) 10 MG tablet TAKE ONE TABLET BY MOUTH DAILY 08/03/22  Yes Nestor Ramp, MD  ondansetron (ZOFRAN-ODT) 4 MG disintegrating tablet Take ONE tablet (FOUR MG total) UNDER THE TONGUE OR by MOUTH TWO (two) times daily as needed FOR nausea OR vomiting. 10/08/21  Yes Nestor Ramp, MD  oxyCODONE-acetaminophen (PERCOCET) 10-325 MG tablet TAKE ONE TABLET BY MOUTH EVERY 4 TO 6 HOURS AS NEEDED CHRONIC PAIN 08/17/22  Yes Nestor Ramp, MD  pantoprazole (PROTONIX) 40 MG tablet Take 1 tablet (40 mg total) by mouth 2 (two) times daily. 08/14/21  Yes Armbruster, Willaim Rayas, MD   cyclobenzaprine (FLEXERIL) 10 MG tablet Take 1 tablet (10 mg total) by mouth 3 (three) times daily as needed for muscle spasms. Patient not taking: Reported on 09/11/2022 08/18/22   Nestor Ramp, MD  DULoxetine (CYMBALTA) 30 MG capsule Take 1 capsule (30 mg total) by mouth daily. Patient not taking: Reported on 09/11/2022 06/10/22   Nestor Ramp, MD  famotidine (PEPCID) 20 MG tablet Take 1 tablet (20 mg total) by mouth 2 (two) times daily. Patient not taking: Reported on 09/11/2022 04/07/21   Ellamae Sia, DO  methocarbamol (ROBAXIN) 500 MG tablet Take 1 tablet (500 mg total) by mouth 3 (three) times daily as needed for muscle spasms. Patient not taking: Reported on 07/06/2022 04/27/22   Arthor Captain, PA-C  methylPREDNISolone (MEDROL DOSEPAK) 4 MG TBPK tablet Take as directed Patient not taking: Reported on 09/11/2022 08/31/22   Vivi Barrack, DPM  silver sulfADIAZINE (SILVADENE) 1 % cream Apply 1 application. topically 2 (two) times daily. Patient not taking: Reported on 09/11/2022 08/15/21   Nestor Ramp, MD      Allergies    Codeine and [place] allergy]    Review of Systems   Review of Systems  Neurological:  Positive for headaches.    Physical Exam Updated Vital Signs BP 109/70   Pulse 74   Temp 98.9 F (37.2 C) (Oral)   Resp 18   SpO2 100%  Physical Exam Vitals and  nursing note reviewed.  Constitutional:      General: She is not in acute distress. HENT:     Head: Normocephalic and atraumatic.     Nose: Nose normal.  Eyes:     General: No scleral icterus. Cardiovascular:     Rate and Rhythm: Normal rate and regular rhythm.     Pulses: Normal pulses.     Heart sounds: Normal heart sounds.  Pulmonary:     Effort: Pulmonary effort is normal. No respiratory distress.     Breath sounds: No wheezing.  Abdominal:     Palpations: Abdomen is soft.     Tenderness: There is no abdominal tenderness.  Musculoskeletal:     Cervical back: Normal range of motion.     Right lower  leg: No edema.     Left lower leg: No edema.  Skin:    General: Skin is warm and dry.     Capillary Refill: Capillary refill takes less than 2 seconds.  Neurological:     Mental Status: She is alert. Mental status is at baseline.  Psychiatric:        Mood and Affect: Mood normal.        Behavior: Behavior normal.   ED Results / Procedures / Treatments   Labs (all labs ordered are listed, but only abnormal results are displayed) Labs Reviewed - No data to display  EKG None  Radiology DG Chest 2 View  Result Date: 09/11/2022 CLINICAL DATA:  Cough for 1 week EXAM: CHEST - 2 VIEW COMPARISON:  10/05/2021 FINDINGS: The heart size and mediastinal contours are within normal limits. Both lungs are clear. The visualized skeletal structures are unremarkable. IMPRESSION: No active cardiopulmonary disease. Electronically Signed   By: Alcide Clever M.D.   On: 09/11/2022 02:12    Procedures Procedures    Medications Ordered in ED Medications - No data to display  ED Course/ Medical Decision Making/ A&P Clinical Course as of 09/11/22 0219  Fri Sep 11, 2022  0125 Since Monday cough, congestion.  No fevers at home. Sinus congestion. No SOB.   [WF]    Clinical Course User Index [WF] Gailen Shelter, [place]                             Medical Decision Making Amount and/or Complexity of Data Reviewed Radiology: ordered.   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  She denies any fevers.  She states that she has chronic neck and headache pain.  She denies any chest pain or difficulty breathing no nausea or vomiting.  Patient with reassuring physical exam.  No crackles or wheezes.  Vital signs within normal  limits.  No hypoxia  I personally viewed x-ray of chest which is without any infiltrate or pneumothorax.  Suspect patient's cough, congestion, and fatigue are related to a viral illness.  Recommend conservative management, observation, follow-up with primary care.  Also provided patient with return precautions to the emergency room.  Final Clinical Impression(s) / ED Diagnoses Final diagnoses:  Viral URI with cough    Rx / DC Orders ED Discharge Orders     None         Zahava Quant, Rodrigo Ran, [place]  09/11/22 0536    Nira Conn, MD 09/11/22 [phone]

## 2022-09-14 ENCOUNTER — Other Ambulatory Visit: Payer: Self-pay | Admitting: Family Medicine

## 2022-09-14 MED ORDER — OXYCODONE-ACETAMINOPHEN 10-325 MG PO TABS: ORAL_TABLET | ORAL | 0 refills | Status: DC

## 2022-09-15 ENCOUNTER — Encounter: Payer: Self-pay | Admitting: Family Medicine

## 2022-09-15 ENCOUNTER — Other Ambulatory Visit: Payer: 59

## 2022-09-16 ENCOUNTER — Telehealth: Payer: Self-pay

## 2022-09-18 ENCOUNTER — Other Ambulatory Visit: Payer: Self-pay | Admitting: Family Medicine

## 2022-09-18 DIAGNOSIS — M5412 Radiculopathy, cervical region: Secondary | ICD-10-CM

## 2022-09-21 ENCOUNTER — Ambulatory Visit: Payer: 59 | Admitting: Podiatry

## 2022-09-21 ENCOUNTER — Encounter: Payer: Self-pay | Admitting: Podiatry

## 2022-09-30 ENCOUNTER — Other Ambulatory Visit: Payer: Self-pay | Admitting: Family Medicine

## 2022-09-30 DIAGNOSIS — Z7901 Long term (current) use of anticoagulants: Secondary | ICD-10-CM

## 2022-10-02 ENCOUNTER — Ambulatory Visit (INDEPENDENT_AMBULATORY_CARE_PROVIDER_SITE_OTHER): Payer: 59 | Admitting: Family Medicine

## 2022-10-02 ENCOUNTER — Other Ambulatory Visit: Payer: Self-pay | Admitting: Family Medicine

## 2022-10-02 ENCOUNTER — Encounter: Payer: Self-pay | Admitting: Family Medicine

## 2022-10-02 VITALS — BP 124/74 | Ht 67.0 in | Wt 280.0 lb

## 2022-10-02 DIAGNOSIS — M62838 Other muscle spasm: Secondary | ICD-10-CM | POA: Diagnosis not present

## 2022-10-02 DIAGNOSIS — M5416 Radiculopathy, lumbar region: Secondary | ICD-10-CM

## 2022-10-02 DIAGNOSIS — M5030 Other cervical disc degeneration, unspecified cervical region: Secondary | ICD-10-CM | POA: Diagnosis not present

## 2022-10-02 DIAGNOSIS — Z8509 Personal history of malignant neoplasm of other digestive organs: Secondary | ICD-10-CM | POA: Diagnosis not present

## 2022-10-02 DIAGNOSIS — C49A2 Gastrointestinal stromal tumor of stomach: Secondary | ICD-10-CM | POA: Diagnosis not present

## 2022-10-02 DIAGNOSIS — M5412 Radiculopathy, cervical region: Secondary | ICD-10-CM | POA: Diagnosis not present

## 2022-10-02 MED ORDER — METHYLPREDNISOLONE ACETATE 40 MG/ML IJ SUSP
40.0000 mg | Freq: Once | INTRAMUSCULAR | Status: AC
  Administered 2022-10-02: 40 mg via INTRA_ARTICULAR

## 2022-10-02 NOTE — Assessment & Plan Note (Signed)
Reviewed her MRI images with her.  Discussed options.  She has previously had some type of either facet or epidural injection in the cervical spine by Dr. Alvester Morin however he told her that it was a very tight space and was not sure he would recommend a future 1.  She thinks he may have retired.  We will refer her to [facility] imaging for further evaluation of possible either epidural steroid injection or other intervention.  Additionally, referred her to neurosurgery.  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  Her pain seems to be progressive and not sure we have a lot of other options to offer her.  I did give her trigger point injection today in the right trapezius muscle.  Hopefully this will give her some relief.  She will follow-up with me after she speaks with interventional radiology and/or neurosurgery.

## 2022-10-02 NOTE — Progress Notes (Signed)
  Crystal Garza - 52 y.o. female MRN [id number]  Date of birth: [birth year]    SUBJECTIVE:      Chief Complaint:/ HPI:  Follow-up for MRI results, neck pain, left upper extremity radiculopathy.  Pain continues to get worse.  Today she is suffering a lot of pain in the neck muscles and trapezius.  Continues to have some radicular pain into the bilateral upper arm as well.    OBJECTIVE: BP 124/74   Ht 5\' 7"  (1.702 m)   Wt 280 lb (127 kg)   BMI 43.85 kg/m   Physical Exam:  Vital signs are reviewed. GENERAL: Well-developed, no acute distress SHOULDERS: Symmetrical.  Significant muscle spasm in the right trapezius with 3 trigger points identified.  Upper extremity strength is 5 out of 5 symmetrical in all planes the rotator cuff bilaterally. NECK: Some limited to forward flexion and extension due to muscle spasm and tightness.  Positive Spurling's.   IMPRESSION: 1. At C4-5 there is a mild broad-based disc bulge. Right foraminal disc protrusion. Moderate-severe right foraminal stenosis. Mild left foraminal stenosis. 2. At C5-6 there is a central disc extrusion contacting the ventral cervical spinal cord. 3. No acute osseous injury of the cervical spine. ASSESSMENT & PLAN:  PROCEDURE: INJECTION: Patient was given informed consent, signed copy in the chart. Appropriate time out was taken. Area prepped and draped in usual sterile fashion. Ethyl chloride was  used for local anesthesia. A 21 gauge 1 1/2 inch needle was used..  1 cc of methylprednisolone 40 mg/ml plus 2 cc of 1% lidocaine without epinephrine was injected into the 3 trigger point areas of the right trapezius muscle using a(n) perpendicular approach.   The patient tolerated the procedure well. There were no complications. Post procedure instructions were given.  See problem based charting & AVS for pt instructions. DDD (degenerative disc disease), cervical Reviewed her MRI images with her.  Discussed options.  She has  previously had some type of either facet or epidural injection in the cervical spine by Dr. Alvester Morin however he told her that it was a very tight space and was not sure he would recommend a future 1.  She thinks he may have retired.  We will refer her to [facility] imaging for further evaluation of possible either epidural steroid injection or other intervention.  Additionally, referred her to neurosurgery.  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  Her pain seems to be progressive and not sure we have a lot of other options to offer her.  I did give her trigger point injection today in the right trapezius muscle.  Hopefully this will give her some relief.  She will follow-up with me after she speaks with interventional radiology and/or neurosurgery.  Total time spent with patient in review of imaging and discussion about options including referrals was 30 minutes.  This was in addition to the time spent evaluating the trigger point and performing the trigger point injection.

## 2022-10-12 ENCOUNTER — Other Ambulatory Visit: Payer: Self-pay | Admitting: Family Medicine

## 2022-10-12 MED ORDER — OXYCODONE-ACETAMINOPHEN 10-325 MG PO TABS: ORAL_TABLET | ORAL | 0 refills | Status: DC

## 2022-10-19 ENCOUNTER — Encounter: Payer: Self-pay | Admitting: Family Medicine

## 2022-10-27 ENCOUNTER — Other Ambulatory Visit: Payer: 59

## 2022-11-09 ENCOUNTER — Other Ambulatory Visit: Payer: Self-pay | Admitting: Family Medicine

## 2022-11-09 MED ORDER — OXYCODONE-ACETAMINOPHEN 10-325 MG PO TABS: ORAL_TABLET | ORAL | 0 refills | Status: DC

## 2022-11-25 ENCOUNTER — Other Ambulatory Visit: Payer: Self-pay | Admitting: Family Medicine

## 2022-11-25 DIAGNOSIS — F419 Anxiety disorder, unspecified: Secondary | ICD-10-CM

## 2022-12-09 ENCOUNTER — Other Ambulatory Visit: Payer: Self-pay | Admitting: Family Medicine

## 2022-12-10 MED ORDER — OXYCODONE-ACETAMINOPHEN 10-325 MG PO TABS: ORAL_TABLET | ORAL | 0 refills | Status: DC

## 2022-12-11 ENCOUNTER — Encounter: Payer: Self-pay | Admitting: Family Medicine

## 2022-12-11 DIAGNOSIS — H538 Other visual disturbances: Secondary | ICD-10-CM

## 2022-12-18 ENCOUNTER — Encounter: Payer: Self-pay | Admitting: Family Medicine

## 2022-12-18 ENCOUNTER — Ambulatory Visit: Payer: Medicaid Other | Admitting: Family Medicine

## 2022-12-18 VITALS — BP 139/88 | Ht 67.0 in | Wt 285.0 lb

## 2022-12-18 DIAGNOSIS — Z6841 Body Mass Index (BMI) 40.0 and over, adult: Secondary | ICD-10-CM

## 2022-12-18 DIAGNOSIS — M5030 Other cervical disc degeneration, unspecified cervical region: Secondary | ICD-10-CM

## 2022-12-18 DIAGNOSIS — M65311 Trigger thumb, right thumb: Secondary | ICD-10-CM | POA: Diagnosis present

## 2022-12-18 DIAGNOSIS — N62 Hypertrophy of breast: Secondary | ICD-10-CM

## 2022-12-18 MED ORDER — METHYLPREDNISOLONE ACETATE 40 MG/ML IJ SUSP
40.0000 mg | Freq: Once | INTRAMUSCULAR | Status: AC
Start: 2022-12-18 — End: 2022-12-18
  Administered 2022-12-18: 40 mg via INTRA_ARTICULAR

## 2022-12-20 ENCOUNTER — Encounter: Payer: Self-pay | Admitting: Family Medicine

## 2022-12-20 NOTE — Progress Notes (Signed)
  Crystal Garza - 52 y.o. female MRN [id number]  Date of birth: 1971-01-07    SUBJECTIVE:      Chief Complaint:/ HPI:   1. Right trigger thumb: started about 3-4 weeks ago. Painful. Sticks.Getting worse. Nothing OTC has helped. 2.Chronic neck/back pain. Having trouble at work working longer than 8 hour shift. Evidently new rules at work and all shifts now 12 hours. 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 This allowed her time to recover in between shifts. She worked back to back 12 hour shifts last weekend and had significant increase in back pain requiring decrease in all activities for several days. Not sure she can continue with long shofts.    OBJECTIVE: BP 139/88   Ht 5\' 7"  (1.702 m)   Wt 285 lb (129.3 kg)   BMI 44.64 kg/m   Physical Exam:  Vital signs are reviewed. GEN WD WN NAD NECK: decreased ROM in forward flexion, extension and lateral rotation. Forward flexion causes in crease in headache type pain BACK: no deformity LE: strength 5/5 knee. Dorsiflexion 4/5 bilaterally THUMB: right: tender nodule A1 pulley area that sticks with extension, painful to palpation. PSYCH: AxOx4. Good eye contact.. No psychomotor retardation or agitation. Appropriate speech fluency and content. Asks and answers questions appropriately. Mood is congruent.  PROCEDURE: INJECTION: Patient was given informed consent, signed copy in the chart. Appropriate time out was taken. Area prepped and draped in usual sterile fashion. Ethyl chloride was  used for local anesthesia. A 21 gauge 1 1/2 inch needle was used.. 1/2 cc of methylprednisolone 40 mg/ml plus  1/2 cc of 1% lidocaine without epinephrine was injected into the right FPL flexor tendon sheath of the thumb using a(n) ventral approach.   The patient tolerated the procedure well. There were no complications. Post procedure instructions were given.  ASSESSMENT & PLAN: Trigger thumb: corticosteroid  injection. F/u if not improving over next 2-4 weeks. See problem based charting & AVS for pt instructions. DDD (degenerative disc disease), cervical [facility] has placed her into PT and she has appt next week. Continue current meds. Will send letter requesting 8 hour only shift as otherwise she may have increased pain.  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 She has managed to do a work intensive job for a long time but she may not be able to do this for 10 more years which is time to retirement. She has explored other options for work and will continue.  If her neck pain continues to progress and hamper her ability to work and PT (already scheduled) does not improve, will again look at [facility] or facet injections. She is actively working on losing weight in preparation for bilateral breast reduction.

## 2022-12-20 NOTE — Assessment & Plan Note (Signed)
[facility] has placed her into PT and she has appt next week. Continue current meds. Will send letter requesting 8 hour only shift as otherwise she may have increased pain.

## 2022-12-20 NOTE — Assessment & Plan Note (Signed)
BMI decreased to 45 in August 2024. (BMI high was 62 in 2019)

## 2022-12-20 NOTE — Assessment & Plan Note (Addendum)
She is actively losing weight in preparation for bilateral breast reduction. Higherst BMI - 60 in 2019 and now 45.

## 2022-12-21 ENCOUNTER — Encounter: Payer: Self-pay | Admitting: Physical Therapy

## 2022-12-21 ENCOUNTER — Ambulatory Visit: Payer: Medicaid Other | Attending: Neurosurgery | Admitting: Physical Therapy

## 2022-12-21 DIAGNOSIS — M6281 Muscle weakness (generalized): Secondary | ICD-10-CM | POA: Insufficient documentation

## 2022-12-21 DIAGNOSIS — M542 Cervicalgia: Secondary | ICD-10-CM | POA: Diagnosis present

## 2022-12-21 NOTE — Therapy (Signed)
OUTPATIENT PHYSICAL THERAPY CERVICAL EVALUATION   Patient Name: Crystal Garza MRN: [id number] DOB:1971-03-28, 52 y.o., female Today's Date: 12/21/2022  END OF SESSION:  PT End of Session - 12/21/22 1030     Visit Number 1    Number of Visits 4   3 initial visits only per Medicaid authorization   Date for PT Re-Evaluation 01/29/23    Authorization Type Medicaid [employer]    PT Start Time 0800    PT Stop Time 0846    PT Time Calculation (min) 46 min    Activity Tolerance Patient tolerated treatment well    Behavior During Therapy WFL for tasks assessed/performed             Past Medical History:  Diagnosis Date   Anemia    iron def   Anginal pain (HCC)    Anxiety    Arthritis    left knee    Back pain    Benign gastrointestinal stromal tumor (GIST)    Chronic knee pain    Depression    DVT (deep venous thrombosis) (HCC)    Esophageal dysmotility    GERD (gastroesophageal reflux disease)    Hiatal hernia    History of kidney stones    Hx of laparoscopic gastric banding    Hypertension    Internal hemorrhoids    Internal hemorrhoids    Migraine    Morbid obesity (HCC)    PE (pulmonary embolism)    Past Surgical History:  Procedure Laterality Date   41 HOUR PH STUDY N/A 05/13/2016   Procedure: 24 HOUR PH STUDY;  Surgeon: Ruffin Frederick, MD;  Location: [facility];  Service: Gastroenterology;  Laterality: N/A;   BIOPSY  02/16/2012   Procedure: BIOPSY;  Surgeon: Valarie Merino, MD;  Location: [facility];  Service: General;;  biopsy of mass x 2   BREATH TEK H PYLORI  07/28/2011   Procedure: BREATH TEK H PYLORI;  Surgeon: Valarie Merino, MD;  Location: [facility];  Service: General;  Laterality: N/A;  to be done at 745   CESAREAN SECTION  05/1991   COLONOSCOPY WITH PROPOFOL N/A 03/03/2016   Procedure: COLONOSCOPY WITH PROPOFOL;  Surgeon: Ruffin Frederick, MD;  Location: [facility];  Service: Gastroenterology;  Laterality: N/A;   ESOPHAGEAL  MANOMETRY N/A 05/13/2016   Procedure: ESOPHAGEAL MANOMETRY (EM);  Surgeon: Ruffin Frederick, MD;  Location: [facility];  Service: Gastroenterology;  Laterality: N/A;   ESOPHAGOGASTRODUODENOSCOPY N/A 08/29/2012   Procedure: ESOPHAGOGASTRODUODENOSCOPY (EGD);  Surgeon: Valarie Merino, MD;  Location: [facility];  Service: General;  Laterality: N/A;   ESOPHAGOGASTRODUODENOSCOPY N/A 09/04/2014   Procedure: ESOPHAGOGASTRODUODENOSCOPY (EGD);  Surgeon: Ovidio Kin, MD;  Location: [facility];  Service: General;  Laterality: N/A;   ESOPHAGOGASTRODUODENOSCOPY (EGD) WITH PROPOFOL N/A 03/03/2016   Procedure: ESOPHAGOGASTRODUODENOSCOPY (EGD) WITH PROPOFOL;  Surgeon: Ruffin Frederick, MD;  Location: [facility];  Service: Gastroenterology;  Laterality: N/A;   EUS  03/03/2012   Procedure: UPPER ENDOSCOPIC ULTRASOUND (EUS) LINEAR;  Surgeon: Rachael Fee, MD;  Location: [facility];  Service: Endoscopy;  Laterality: N/A;   GASTRIC ROUX-EN-Y N/A 12/07/2017   Procedure: LAPAROSCOPIC ROUX-EN-Y GASTRIC BYPASS WITH LYSIS OF ADHESIONS AND UPPER ENDOSCOPY ERAS Pathway;  Surgeon: Luretha Murphy, MD;  Location: [facility];  Service: General;  Laterality: N/A;   GIST TUMOR STOMACH N/A    KNEE ARTHROSCOPY     LAPAROSCOPIC GASTRECTOMY  04/08/2012   Procedure: LAPAROSCOPIC GASTRECTOMY;  Surgeon: Valarie Merino, MD;  Location: [facility];  Service: General;;  removal of GIST tumor of stomach   LAPAROSCOPIC GASTRIC SLEEVE RESECTION N/A 08/29/2012   Procedure: LAPAROSCOPIC GASTRIC SLEEVE RESECTION;  Surgeon: Valarie Merino, MD;  Location: [facility];  Service: General;  Laterality: N/A;  Sleeve Gastrectomy   LEFT HEART CATH AND CORONARY ANGIOGRAPHY N/A 05/28/2017   Procedure: LEFT HEART CATH AND CORONARY ANGIOGRAPHY;  Surgeon: Marykay Lex, MD;  Location: [facility];  Service: Cardiovascular;  Laterality: N/A;   PANNICULECTOMY N/A 06/09/2021   Procedure: PANNICULECTOMY;  Surgeon: Luretha Murphy, MD;   Location: [facility];  Service: General;  Laterality: N/A;   ROUX-EN-Y GASTRIC BYPASS     TOTAL ABDOMINAL HYSTERECTOMY  04/2004   TOTAL KNEE ARTHROPLASTY Left 10/06/2013   Procedure: LEFT TOTAL KNEE ARTHROPLASTY;  Surgeon: Kathryne Hitch, MD;  Location: [facility];  Service: Orthopedics;  Laterality: Left;   TUBAL LIGATION     Patient Active Problem List   Diagnosis Date Noted   Primary osteoarthritis of both feet 07/24/2021   DDD (degenerative disc disease), cervical 07/24/2021   Arthropathy of thoracic facet joint 07/24/2021   Fibromyalgia 07/24/2021   S/P abdominal  panniculectomy 06/09/2021   Chronic rhinitis 11/28/2020   Hx of food allergy 09/13/2020   Healthcare maintenance 09/13/2020   Constipation due to pain medication 03/06/2020   Cervical radiculopathy at C6 03/29/2019   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   History of pulmonary embolism 05/25/2017   Esophageal dysphagia    Family history of colon cancer    Macromastia 02/27/2015   Chronic neck and back pain 02/27/2015   Chronic anticoagulation 05/17/2014   Degenerative arthritis of left knee 10/06/2013   Status post total knee replacement 10/06/2013   Chronic pain 06/13/2013   Lap Sleeve Gastrectomy May 2014 09/16/2012   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) 02/17/2012   Nephrolithiasis 02/20/2011   Other mixed anxiety disorders 06/27/2010   Migraine 09/12/2009   BMI 45.0-49.9, adult (HCC) 10/30/2008   Major depressive disorder, recurrent episode (HCC) 07/01/2006   HYPERTENSION, BENIGN SYSTEMIC 07/01/2006   Gastroesophageal reflux disease 07/01/2006    PCP: Nestor Ramp., MD  REFERRING PROVIDER: Lisbeth Renshaw, MD  REFERRING DIAG: Diagnosis M50.90 (ICD-10-CM) - Cervical disc disorder, unspecified, unspecified cervical region  THERAPY DIAG:  Cervicalgia  Rationale for Evaluation and Treatment: Rehabilitation  ONSET DATE: 2023:  Referral date  SUBJECTIVE:  SUBJECTIVE STATEMENT: Pt presents with c/o cervical and upper trap pain;  pt reports she had MRI done in May - thinks she may need surgery due to herniated discs.  Pt was initially referred to PT at end of June but states there were problems with her insurance at that time and she was not able to come; had [place] and Medicaid secondary - now has Medicaid only.  Pt reports she would have come earlier if it had not been for the insurance mix-up.  Pt states she had PT about 5-6 years ago for her neck and it didn't really help.  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  Pt reports working 12 hours really exacerbates her pain - PCP, Dr. Jennette Kettle, wrote letter last week stating need for 8 hour shift for assist with pain management.    Hand dominance: Right  PERTINENT HISTORY:  C6 Cervical radiculopathy, chronic neck and back pain, h/o PE, fibromyalgia, received injection for Rt trigger thumb on 12-18-22:  trigger point injection in Rt trapezius on 10-02-22  PAIN:  Are you having pain? Yes: NPRS scale: 9/10 Pain location: posterior cervical and into top of shoulders - both sides, but Rt side appears to be slightly more tender than Lt side Pain description: sharp, constant, achy Aggravating factors: work aggravates it Relieving factors: heat packs, analgesic gel  PRECAUTIONS: None  RED FLAGS: None     WEIGHT BEARING RESTRICTIONS: No  FALLS:  Has patient fallen in last 6 months? 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 At C4-5 there is a mild broad-based disc bulge. Right foraminal disc protrusion. Moderate-severe right foraminal stenosis. Mild left foraminal stenosis. 2. At C5-6 there is a central disc extrusion contacting the ventral cervical spinal cord. 3. No acute osseous injury of the cervical spine.    PATIENT SURVEYS:  NECK Pain & Disability Questionnaire  58% disability  COGNITION: Overall cognitive status: Within functional limits for tasks assessed  SENSATION: Numbness & tingling in both hands - due to CTS in bil. Hands;   POSTURE: rounded shoulders and forward head  PALPATION: Tender to palpation bil. Upper traps and posterior cervical musc. - Rt side appears slightly more tender than Lt side   CERVICAL ROM:   Active ROM A/PROM (deg) eval  Flexion 28  Extension 42  Right lateral flexion 28  Left lateral flexion 38  Right rotation 44  Left rotation 50   (Blank rows = not tested)  UPPER EXTREMITY ROM:  WFL's bil. UE's   UPPER EXTREMITY MMT:  MMT Right eval Left eval  Shoulder flexion 4 4  Shoulder extension    Shoulder abduction 3+ 3+  Shoulder adduction    Shoulder extension    Shoulder internal rotation    Shoulder external rotation    Middle trapezius    Lower trapezius    Elbow flexion    Elbow extension    Wrist  flexion    Wrist extension    Wrist ulnar deviation    Wrist radial deviation    Wrist pronation    Wrist supination    Grip strength     (Blank rows = not tested)  Pain with resistance with MMT  CERVICAL SPECIAL TESTS:  Cranial cervical flexion test: Positive active cervical flexion increases pain  FUNCTIONAL TESTS:  N/A  TODAY'S TREATMENT:                                                                                                                               DATE: 12-21-22   PATIENT EDUCATION:  Education details: Medbridge [phone]; also recommended use of BioFreeze and Tens unit for pain management (Tens unit 7000); pt stated she had used Tens in the past but no longer had the unit Person educated: Patient Education method: Explanation, Demonstration, and Handouts Education comprehension: verbalized understanding and returned demonstration  HOME EXERCISE PROGRAM: Medbridge Access Code: [email or web address] URL: [email or web address] Date: 12/21/2022 Prepared by: Maebelle Munroe  Exercises - Seated Assisted Cervical Rotation with Towel  - 1 x daily - 7 x weekly - 1 sets - 2 reps - 15-20 hold - Seated Cervical Traction  - 1 x daily - 7 x weekly - 1 sets - 2 reps - 15-20 hold - Seated Cervical Sidebending Stretch  - 1 x daily - 7 x weekly - 1 sets - 2-3 reps - 15 hold - Seated Cervical Retraction and Extension  - 1 x daily - 7 x weekly - 1 sets - 2-3 reps  ASSESSMENT:  CLINICAL IMPRESSION:  Patient is a 52 y.o. lady who was seen today for physical therapy evaluation and treatment for cervicalgia due to cervical disc disorders including cervical stenosis and disc protrusion per MRI performed in May 2024.  Pt presents with bil. Upper trap tightness and tenderness with palpation with Rt side slightly more tender than Lt side.  Pt's shoulder strength is grossly 3+ for bil. Shoulder abductors and 4/5 for bil. Shoulder flexors, however, pt reports pain with resistance with manual muscle testing.  Pt also presents with decreased cervical AROM.  Pt's Neck Pain & Disability Questionnaire score is 58%.  Pt will benefit from PT for dry needling for pain management and stretching exercises for increasing cervical ROM.       OBJECTIVE IMPAIRMENTS: decreased ROM, decreased strength, and pain.   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 Yes  SHORT TERM GOALS: = LTG's = 3 visits initially only per Medicaid authorization   LONG TERM GOALS: Target date: 01-15-23 (3 visits)  Pt will report decreased cervical pain to </= 7/10 at rest to increase comfort and ease with ADL's & work activities. Baseline: 9/10 at rest  Goal status: INITIAL  2.  Pt's disability % score on Neck Pain & Disability Questionnaire will improve to </= 44% to increase ease with work activities. Baseline: 58% on 12-21-22 Goal status: INITIAL  3.  Increase cervical rotation by at least 10 degrees for both Rt and Lt cervical rotation to increase ease with head turns with driving.  Baseline: Rt cervical rotation= 44 degrees:  Lt cervical rotation = 50 degrees Goal status: INITIAL  4.  Independent in HEP for cervical stretching and strengthening. Baseline: Dependent Goal status: INITIAL   PLAN:  PT FREQUENCY: 1x/week  PT DURATION: 3 weeks  PLANNED INTERVENTIONS: Therapeutic exercises, Therapeutic activity, Neuromuscular re-education, Patient/Family education, Self Care, Dry Needling, Electrical stimulation, Moist heat, Traction, and Manual therapy  PLAN FOR NEXT SESSION: Dry needling for 2 sessions, then manual therapy and HEP - determine D/C or renewal after initial 3 visits authorized   Omarri Eich Suzanne, PT 12/21/2022, 10:33 AM  Check all  possible CPT codes: 16109- Therapeutic Exercise, [id number]- Neuro Re-education, 97140 - Manual Therapy, 97530 - Therapeutic Activities, 97535 - Self Care, and [id number] - Electrical stimulation (Manual)    Check all conditions that are expected to impact treatment: Musculoskeletal disorders   If treatment provided at initial evaluation, no treatment charged due to lack of authorization.

## 2022-12-30 ENCOUNTER — Other Ambulatory Visit: Payer: Self-pay | Admitting: Family Medicine

## 2022-12-30 ENCOUNTER — Telehealth: Payer: Self-pay | Admitting: *Deleted

## 2022-12-31 ENCOUNTER — Encounter: Payer: Self-pay | Admitting: Family Medicine

## 2022-12-31 ENCOUNTER — Ambulatory Visit: Payer: Medicaid Other | Admitting: Physical Therapy

## 2022-12-31 DIAGNOSIS — M542 Cervicalgia: Secondary | ICD-10-CM

## 2022-12-31 DIAGNOSIS — M6281 Muscle weakness (generalized): Secondary | ICD-10-CM

## 2022-12-31 MED ORDER — OXYCODONE-ACETAMINOPHEN 10-325 MG PO TABS: ORAL_TABLET | ORAL | 0 refills | Status: DC

## 2022-12-31 MED ORDER — DULOXETINE HCL 20 MG PO CPEP: 20.0000 mg | ORAL_CAPSULE | Freq: Every day | ORAL | 3 refills | Status: DC

## 2022-12-31 NOTE — Telephone Encounter (Signed)
done

## 2022-12-31 NOTE — Therapy (Signed)
OUTPATIENT PHYSICAL THERAPY CERVICAL TREATMENT   Patient Name: Crystal Garza MRN: [id number] DOB:[birth year], 52 y.o., female Today's Date: 12/31/2022  END OF SESSION:  PT End of Session - 12/31/22 0809     Visit Number 2    Number of Visits 4   3 initial visits only per Medicaid authorization   Date for PT Re-Evaluation 01/29/23    Authorization Type Medicaid [place]    PT Start Time 0810   pt arrived late   PT Stop Time 0840    PT Time Calculation (min) 30 min    Activity Tolerance Patient tolerated treatment well    Behavior During Therapy WFL for tasks assessed/performed              Past Medical History:  Diagnosis Date   Anemia    iron def   Anginal pain (HCC)    Anxiety    Arthritis    left knee    Back pain    Benign gastrointestinal stromal tumor (GIST)    Chronic knee pain    Depression    DVT (deep venous thrombosis) (HCC)    Esophageal dysmotility    GERD (gastroesophageal reflux disease)    Hiatal hernia    History of kidney stones    Hx of laparoscopic gastric banding    Hypertension    Internal hemorrhoids    Internal hemorrhoids    Migraine    Morbid obesity (HCC)    PE (pulmonary embolism)    Past Surgical History:  Procedure Laterality Date   54 HOUR PH STUDY N/A 05/13/2016   Procedure: 24 HOUR PH STUDY;  Surgeon: Ruffin Frederick, MD;  Location: [facility];  Service: Gastroenterology;  Laterality: N/A;   BIOPSY  02/16/2012   Procedure: BIOPSY;  Surgeon: Valarie Merino, MD;  Location: [facility];  Service: General;;  biopsy of mass x 2   BREATH TEK H PYLORI  07/28/2011   Procedure: BREATH TEK H PYLORI;  Surgeon: Valarie Merino, MD;  Location: [facility];  Service: General;  Laterality: N/A;  to be done at 745   CESAREAN SECTION  05/1991   COLONOSCOPY WITH PROPOFOL N/A 03/03/2016   Procedure: COLONOSCOPY WITH PROPOFOL;  Surgeon: Ruffin Frederick, MD;  Location: [facility];  Service: Gastroenterology;  Laterality: N/A;    ESOPHAGEAL MANOMETRY N/A 05/13/2016   Procedure: ESOPHAGEAL MANOMETRY (EM);  Surgeon: Ruffin Frederick, MD;  Location: [facility];  Service: Gastroenterology;  Laterality: N/A;   ESOPHAGOGASTRODUODENOSCOPY N/A 08/29/2012   Procedure: ESOPHAGOGASTRODUODENOSCOPY (EGD);  Surgeon: Valarie Merino, MD;  Location: [facility];  Service: General;  Laterality: N/A;   ESOPHAGOGASTRODUODENOSCOPY N/A 09/04/2014   Procedure: ESOPHAGOGASTRODUODENOSCOPY (EGD);  Surgeon: Ovidio Kin, MD;  Location: [facility];  Service: General;  Laterality: N/A;   ESOPHAGOGASTRODUODENOSCOPY (EGD) WITH PROPOFOL N/A 03/03/2016   Procedure: ESOPHAGOGASTRODUODENOSCOPY (EGD) WITH PROPOFOL;  Surgeon: Ruffin Frederick, MD;  Location: [facility];  Service: Gastroenterology;  Laterality: N/A;   EUS  03/03/2012   Procedure: UPPER ENDOSCOPIC ULTRASOUND (EUS) LINEAR;  Surgeon: Rachael Fee, MD;  Location: [facility];  Service: Endoscopy;  Laterality: N/A;   GASTRIC ROUX-EN-Y N/A 12/07/2017   Procedure: LAPAROSCOPIC ROUX-EN-Y GASTRIC BYPASS WITH LYSIS OF ADHESIONS AND UPPER ENDOSCOPY ERAS Pathway;  Surgeon: Luretha Murphy, MD;  Location: [facility];  Service: General;  Laterality: N/A;   GIST TUMOR STOMACH N/A    KNEE ARTHROSCOPY     LAPAROSCOPIC GASTRECTOMY  04/08/2012   Procedure: LAPAROSCOPIC GASTRECTOMY;  Surgeon: Thornton Park  Daphine Deutscher, MD;  Location: [facility];  Service: General;;  removal of GIST tumor of stomach   LAPAROSCOPIC GASTRIC SLEEVE RESECTION N/A 08/29/2012   Procedure: LAPAROSCOPIC GASTRIC SLEEVE RESECTION;  Surgeon: Valarie Merino, MD;  Location: [facility];  Service: General;  Laterality: N/A;  Sleeve Gastrectomy   LEFT HEART CATH AND CORONARY ANGIOGRAPHY N/A 05/28/2017   Procedure: LEFT HEART CATH AND CORONARY ANGIOGRAPHY;  Surgeon: Marykay Lex, MD;  Location: [facility];  Service: Cardiovascular;  Laterality: N/A;   PANNICULECTOMY N/A 06/09/2021   Procedure: PANNICULECTOMY;  Surgeon: Luretha Murphy, MD;  Location: [facility];  Service: General;  Laterality: N/A;   ROUX-EN-Y GASTRIC BYPASS     TOTAL ABDOMINAL HYSTERECTOMY  04/2004   TOTAL KNEE ARTHROPLASTY Left 10/06/2013   Procedure: LEFT TOTAL KNEE ARTHROPLASTY;  Surgeon: Kathryne Hitch, MD;  Location: [facility];  Service: Orthopedics;  Laterality: Left;   TUBAL LIGATION     Patient Active Problem List   Diagnosis Date Noted   Primary osteoarthritis of both feet 07/24/2021   DDD (degenerative disc disease), cervical 07/24/2021   Arthropathy of thoracic facet joint 07/24/2021   Fibromyalgia 07/24/2021   S/P abdominal  panniculectomy 06/09/2021   Chronic rhinitis 11/28/2020   Hx of food allergy 09/13/2020   Healthcare maintenance 09/13/2020   Constipation due to pain medication 03/06/2020   Cervical radiculopathy at C6 03/29/2019   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   History of pulmonary embolism 05/25/2017   Esophageal dysphagia    Family history of colon cancer    Macromastia 02/27/2015   Chronic neck and back pain 02/27/2015   Chronic anticoagulation 05/17/2014   Degenerative arthritis of left knee 10/06/2013   Status post total knee replacement 10/06/2013   Chronic pain 06/13/2013   Lap Sleeve Gastrectomy May 2014 09/16/2012   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) 02/17/2012   Nephrolithiasis 02/20/2011   Other mixed anxiety disorders 06/27/2010   Migraine 09/12/2009   BMI 45.0-49.9, adult (HCC) 10/30/2008   Major depressive disorder, recurrent episode (HCC) 07/01/2006   HYPERTENSION, BENIGN SYSTEMIC 07/01/2006   Gastroesophageal reflux disease 07/01/2006    PCP: Nestor Ramp., MD  REFERRING PROVIDER: Lisbeth Renshaw, MD  REFERRING DIAG: Diagnosis M50.90 (ICD-10-CM) - Cervical disc disorder, unspecified, unspecified cervical region  THERAPY DIAG:  Cervicalgia  Muscle weakness (generalized)  Rationale for Evaluation and Treatment: Rehabilitation  ONSET  DATE: 2023:  Referral date  SUBJECTIVE:  SUBJECTIVE STATEMENT: Pt states her pain is "not good" today, 10+, pretty much been that since initial eval. Pt reports she has been experiencing this pain for the past 4 months or so.  Hand dominance: Right  PERTINENT HISTORY:  C6 Cervical radiculopathy, chronic neck and back pain, h/o PE, fibromyalgia, received injection for Rt trigger thumb on 12-18-22:  trigger point injection in Rt trapezius on 10-02-22  PAIN:  Are you having pain? Yes: NPRS scale: 9/10 Pain location: posterior cervical and into top of shoulders - both sides, but Rt side appears to be slightly more tender than Lt side Pain description: sharp, constant, achy Aggravating factors: work aggravates it Relieving factors: heat packs, analgesic gel  PRECAUTIONS: None  RED FLAGS: None     WEIGHT BEARING RESTRICTIONS: No  FALLS:  Has patient fallen in last 6 months? 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 At C4-5 there is a mild broad-based disc bulge. Right foraminal disc protrusion. Moderate-severe right foraminal stenosis. Mild left foraminal stenosis. 2. At C5-6 there is a central disc extrusion contacting the ventral cervical spinal cord. 3. No acute osseous injury of the cervical spine.    PATIENT SURVEYS:  NECK Pain & Disability Questionnaire  58% disability  COGNITION: Overall cognitive status: Within functional limits for tasks  assessed  SENSATION: Numbness & tingling in both hands - due to CTS in bil. Hands;   POSTURE: rounded shoulders and forward head  PALPATION: Tender to palpation bil. Upper traps and posterior cervical musc. - Rt side appears slightly more tender than Lt side   CERVICAL ROM:   Active ROM A/PROM (deg) eval  Flexion 28  Extension 42  Right lateral flexion 28  Left lateral flexion 38  Right rotation 44  Left rotation 50   (Blank rows = not tested)  UPPER EXTREMITY ROM:  WFL's bil. UE's   UPPER EXTREMITY MMT:  MMT Right eval Left eval  Shoulder flexion 4 4  Shoulder extension    Shoulder abduction 3+ 3+  Shoulder adduction    Shoulder extension    Shoulder internal rotation    Shoulder external rotation    Middle trapezius    Lower trapezius    Elbow flexion    Elbow extension    Wrist flexion    Wrist extension    Wrist ulnar deviation    Wrist radial deviation    Wrist pronation    Wrist supination    Grip strength     (Blank rows = not tested)  Pain with resistance with MMT  CERVICAL SPECIAL TESTS:  Cranial cervical flexion test: Positive active cervical flexion increases pain  FUNCTIONAL TESTS:  N/A  TODAY'S TREATMENT:  TherEx Seated UT stretch 3 x 30 sec each B Seated levator scap stretch 3 x 30 sec each B  Added to HEP, see bolded below  TherAct Trigger Point Dry-Needling  Treatment instructions: Expect mild to moderate muscle soreness. S/S of pneumothorax if dry needled over a lung field, and to seek immediate medical attention should they occur. Patient verbalized understanding of these instructions and education.  Patient Consent Given: Yes Education handout provided: Yes Muscles treated: L and R suboccipitals, L and R splenius, L and R upper traps Treatment response/outcome: deep ache/muscle cramp; muscle twitch  detected  Trigger Point Dry Needling  What is Trigger Point Dry Needling (DN)? DN is a physical therapy technique used to treat muscle pain and dysfunction. Specifically, DN helps deactivate muscle trigger points (muscle knots).  A thin filiform needle is used to penetrate the skin and stimulate the underlying trigger point. The goal is for a local twitch response (LTR) to occur and for the trigger point to relax. No medication of any kind is injected during the procedure.   What Does Trigger Point Dry Needling Feel Like?  The procedure feels different for each individual patient. Some patients report that they do not actually feel the needle enter the skin and overall the process is not painful. Very mild bleeding may occur. However, many patients feel a deep cramping in the muscle in which the needle was inserted. This is the local twitch response.   How Will I feel after the treatment? Soreness is normal, and the onset of soreness may not occur for a few hours. Typically this soreness does not last longer than two days.  Bruising is uncommon, however; ice can be used to decrease any possible bruising.  In rare cases feeling tired or nauseous after the treatment is normal. In addition, your symptoms may get worse before they get better, this period will typically not last longer than 24 hours.   What Can I do After My Treatment? Increase your hydration by drinking more water for the next 24 hours. You may place ice or heat on the areas treated that have become sore, however, do not use heat on inflamed or bruised areas. Heat often brings more relief post needling. You can continue your regular activities, but vigorous activity is not recommended initially after the treatment for 24 hours. DN is best combined with other physical therapy such as strengthening, stretching, and other therapies.     PATIENT EDUCATION:  Education details: TPDN (see above), added to HEP Person educated:  Patient Education method: Explanation, Demonstration, and Handouts Education comprehension: verbalized understanding and returned demonstration  HOME EXERCISE PROGRAM: Medbridge Access Code: [email or web address] URL: [email or web address] Date: 12/21/2022 Prepared by: Maebelle Munroe  Exercises - Seated Assisted Cervical Rotation with Towel  - 1 x daily - 7 x weekly - 1 sets - 2 reps - 15-20 hold - Seated Cervical Traction  - 1 x daily - 7 x weekly - 1 sets - 2 reps - 15-20 hold - Seated Cervical Sidebending Stretch  - 1 x daily - 7 x weekly - 1 sets - 2-3 reps - 15 hold - Seated Cervical Retraction and Extension  - 1 x daily - 7 x weekly - 1 sets - 2-3 reps - Seated Upper Trapezius Stretch  - 1 x daily - 7 x weekly - 1 sets - 2-3 reps - 30 sec hold - Gentle Levator Scapulae Stretch  - 1 x daily - 7 x weekly - 1 sets - 2-3  reps - 30 sec hold  ASSESSMENT:  CLINICAL IMPRESSION: Emphasis of skilled PT session on initiating TPDN as well as adding to HEP to address ongoing neck pain and muscle tightness. Pt with some muscle twitches detected following TPDN, can assess full response next session. Pt continues to benefit from skilled therapy services to work towards increased independence with management of pain symptoms. Continue POC.     OBJECTIVE IMPAIRMENTS: decreased ROM, decreased strength, and pain.   ACTIVITY LIMITATIONS: carrying, lifting, sleeping, reach over head, and caring for others, and performing work activities - pt is unable to work the required 12 hour shift at the SNF due to increased pain  PARTICIPATION LIMITATIONS: cleaning, laundry, driving, community activity, and occupation  PERSONAL FACTORS: Time since onset of injury/illness/exacerbation and severity of cervical disc protrusions and the "central disc extrusion contacting the ventral cervical spinal cord"    REHAB POTENTIAL: Fair due to severity of cervical disc protrusions - see MRI results above  CLINICAL  DECISION MAKING: Stable/uncomplicated  EVALUATION COMPLEXITY: Low   GOALS: Goals reviewed with patient? Yes  SHORT TERM GOALS: = LTG's = 3 visits initially only per Medicaid authorization   LONG TERM GOALS: Target date: 01-15-23 (3 visits)  Pt will report decreased cervical pain to </= 7/10 at rest to increase comfort and ease with ADL's & work activities. Baseline: 9/10 at rest  Goal status: INITIAL  2.  Pt's disability % score on Neck Pain & Disability Questionnaire will improve to </= 44% to increase ease with work activities. Baseline: 58% on 12-21-22 Goal status: INITIAL  3.  Increase cervical rotation by at least 10 degrees for both Rt and Lt cervical rotation to increase ease with head turns with driving.  Baseline: Rt cervical rotation= 44 degrees:  Lt cervical rotation = 50 degrees Goal status: INITIAL  4.  Independent in HEP for cervical stretching and strengthening. Baseline: Dependent Goal status: INITIAL   PLAN:  PT FREQUENCY: 1x/week  PT DURATION: 3 weeks  PLANNED INTERVENTIONS: Therapeutic exercises, Therapeutic activity, Neuromuscular re-education, Patient/Family education, Self Care, Dry Needling, Electrical stimulation, Moist heat, Traction, and Manual therapy  PLAN FOR NEXT SESSION: Dry needling for 2 sessions, then manual therapy and HEP - determine D/C or renewal after initial 3 visits authorized, how was response to DN?    Peter Congo, PT, DPT, [facility] [address] [place], [place]  [address] Phone:  [phone] Fax:  [phone]  12/31/2022, 8:40 AM  Check all possible CPT codes: 97110- Therapeutic Exercise, [phone]- Neuro Re-education, [phone] - Manual Therapy, 97530 - Therapeutic Activities, [phone] - Self Care, and [phone] - Electrical stimulation (Manual)    Check all conditions that are expected to impact treatment: Musculoskeletal disorders   If treatment provided at initial evaluation, no treatment  charged due to lack of authorization.

## 2023-01-07 ENCOUNTER — Ambulatory Visit: Payer: Medicaid Other | Attending: Neurosurgery | Admitting: Physical Therapy

## 2023-01-07 DIAGNOSIS — M542 Cervicalgia: Secondary | ICD-10-CM | POA: Insufficient documentation

## 2023-01-07 DIAGNOSIS — M6281 Muscle weakness (generalized): Secondary | ICD-10-CM | POA: Diagnosis present

## 2023-01-07 NOTE — Therapy (Signed)
OUTPATIENT PHYSICAL THERAPY CERVICAL TREATMENT   Patient Name: Crystal Garza MRN: [id number] DOB:21-Jan-1971, 52 y.o., female Today's Date: 01/07/2023  END OF SESSION:  PT End of Session - 01/07/23 0934     Visit Number 3    Number of Visits 4   3 initial visits only per Medicaid authorization   Date for PT Re-Evaluation 01/29/23    Authorization Type Medicaid [place]    PT Start Time 0934    PT Stop Time 1015    PT Time Calculation (min) 41 min    Activity Tolerance Patient tolerated treatment well    Behavior During Therapy WFL for tasks assessed/performed               Past Medical History:  Diagnosis Date   Anemia    iron def   Anginal pain (HCC)    Anxiety    Arthritis    left knee    Back pain    Benign gastrointestinal stromal tumor (GIST)    Chronic knee pain    Depression    DVT (deep venous thrombosis) (HCC)    Esophageal dysmotility    GERD (gastroesophageal reflux disease)    Hiatal hernia    History of kidney stones    Hx of laparoscopic gastric banding    Hypertension    Internal hemorrhoids    Internal hemorrhoids    Migraine    Morbid obesity (HCC)    PE (pulmonary embolism)    Past Surgical History:  Procedure Laterality Date   28 HOUR PH STUDY N/A 05/13/2016   Procedure: 24 HOUR PH STUDY;  Surgeon: Ruffin Frederick, MD;  Location: [facility];  Service: Gastroenterology;  Laterality: N/A;   BIOPSY  02/16/2012   Procedure: BIOPSY;  Surgeon: Valarie Merino, MD;  Location: [facility];  Service: General;;  biopsy of mass x 2   BREATH TEK H PYLORI  07/28/2011   Procedure: BREATH TEK H PYLORI;  Surgeon: Valarie Merino, MD;  Location: [facility];  Service: General;  Laterality: N/A;  to be done at 745   CESAREAN SECTION  05/1991   COLONOSCOPY WITH PROPOFOL N/A 03/03/2016   Procedure: COLONOSCOPY WITH PROPOFOL;  Surgeon: Ruffin Frederick, MD;  Location: [facility];  Service: Gastroenterology;  Laterality: N/A;   ESOPHAGEAL  MANOMETRY N/A 05/13/2016   Procedure: ESOPHAGEAL MANOMETRY (EM);  Surgeon: Ruffin Frederick, MD;  Location: [facility];  Service: Gastroenterology;  Laterality: N/A;   ESOPHAGOGASTRODUODENOSCOPY N/A 08/29/2012   Procedure: ESOPHAGOGASTRODUODENOSCOPY (EGD);  Surgeon: Valarie Merino, MD;  Location: [facility];  Service: General;  Laterality: N/A;   ESOPHAGOGASTRODUODENOSCOPY N/A 09/04/2014   Procedure: ESOPHAGOGASTRODUODENOSCOPY (EGD);  Surgeon: Ovidio Kin, MD;  Location: [facility];  Service: General;  Laterality: N/A;   ESOPHAGOGASTRODUODENOSCOPY (EGD) WITH PROPOFOL N/A 03/03/2016   Procedure: ESOPHAGOGASTRODUODENOSCOPY (EGD) WITH PROPOFOL;  Surgeon: Ruffin Frederick, MD;  Location: [facility];  Service: Gastroenterology;  Laterality: N/A;   EUS  03/03/2012   Procedure: UPPER ENDOSCOPIC ULTRASOUND (EUS) LINEAR;  Surgeon: Rachael Fee, MD;  Location: [facility];  Service: Endoscopy;  Laterality: N/A;   GASTRIC ROUX-EN-Y N/A 12/07/2017   Procedure: LAPAROSCOPIC ROUX-EN-Y GASTRIC BYPASS WITH LYSIS OF ADHESIONS AND UPPER ENDOSCOPY ERAS Pathway;  Surgeon: Luretha Murphy, MD;  Location: [facility];  Service: General;  Laterality: N/A;   GIST TUMOR STOMACH N/A    KNEE ARTHROSCOPY     LAPAROSCOPIC GASTRECTOMY  04/08/2012   Procedure: LAPAROSCOPIC GASTRECTOMY;  Surgeon: Valarie Merino, MD;  Location: [facility];  Service: General;;  removal of GIST tumor of stomach   LAPAROSCOPIC GASTRIC SLEEVE RESECTION N/A 08/29/2012   Procedure: LAPAROSCOPIC GASTRIC SLEEVE RESECTION;  Surgeon: Valarie Merino, MD;  Location: [facility];  Service: General;  Laterality: N/A;  Sleeve Gastrectomy   LEFT HEART CATH AND CORONARY ANGIOGRAPHY N/A 05/28/2017   Procedure: LEFT HEART CATH AND CORONARY ANGIOGRAPHY;  Surgeon: Marykay Lex, MD;  Location: [facility];  Service: Cardiovascular;  Laterality: N/A;   PANNICULECTOMY N/A 06/09/2021   Procedure: PANNICULECTOMY;  Surgeon: Luretha Murphy, MD;   Location: [facility];  Service: General;  Laterality: N/A;   ROUX-EN-Y GASTRIC BYPASS     TOTAL ABDOMINAL HYSTERECTOMY  04/2004   TOTAL KNEE ARTHROPLASTY Left 10/06/2013   Procedure: LEFT TOTAL KNEE ARTHROPLASTY;  Surgeon: Kathryne Hitch, MD;  Location: [facility];  Service: Orthopedics;  Laterality: Left;   TUBAL LIGATION     Patient Active Problem List   Diagnosis Date Noted   Primary osteoarthritis of both feet 07/24/2021   DDD (degenerative disc disease), cervical 07/24/2021   Arthropathy of thoracic facet joint 07/24/2021   Fibromyalgia 07/24/2021   S/P abdominal  panniculectomy 06/09/2021   Chronic rhinitis 11/28/2020   Hx of food allergy 09/13/2020   Healthcare maintenance 09/13/2020   Constipation due to pain medication 03/06/2020   Cervical radiculopathy at C6 03/29/2019   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   History of pulmonary embolism 05/25/2017   Esophageal dysphagia    Family history of colon cancer    Macromastia 02/27/2015   Chronic neck and back pain 02/27/2015   Chronic anticoagulation 05/17/2014   Degenerative arthritis of left knee 10/06/2013   Status post total knee replacement 10/06/2013   Chronic pain 06/13/2013   Lap Sleeve Gastrectomy May 2014 09/16/2012   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) 02/17/2012   Nephrolithiasis 02/20/2011   Other mixed anxiety disorders 06/27/2010   Migraine 09/12/2009   BMI 45.0-49.9, adult (HCC) 10/30/2008   Major depressive disorder, recurrent episode (HCC) 07/01/2006   HYPERTENSION, BENIGN SYSTEMIC 07/01/2006   Gastroesophageal reflux disease 07/01/2006    PCP: Nestor Ramp., MD  REFERRING PROVIDER: Lisbeth Renshaw, MD  REFERRING DIAG: Diagnosis M50.90 (ICD-10-CM) - Cervical disc disorder, unspecified, unspecified cervical region  THERAPY DIAG:  Cervicalgia  Muscle weakness (generalized)  Rationale for Evaluation and Treatment: Rehabilitation  ONSET DATE: 2023:   Referral date  SUBJECTIVE:  SUBJECTIVE STATEMENT: Pt reports she did have some soreness after needling last session that lasted a few days but did feel like it helped with her pain overall. Pt reports 7/10 pain today. Pt reports that her HEP is going well, enjoys the SNAG exercises.  Hand dominance: Right  PERTINENT HISTORY:  C6 Cervical radiculopathy, chronic neck and back pain, h/o PE, fibromyalgia, received injection for Rt trigger thumb on 12-18-22:  trigger point injection in Rt trapezius on 10-02-22  PAIN:  Are you having pain? Yes: NPRS scale: 9/10 Pain location: posterior cervical and into top of shoulders - both sides, but Rt side appears to be slightly more tender than Lt side Pain description: sharp, constant, achy Aggravating factors: work aggravates it Relieving factors: heat packs, analgesic gel  PRECAUTIONS: None  RED FLAGS: None     WEIGHT BEARING RESTRICTIONS: No  FALLS:  Has patient fallen in last 6 months? 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 At C4-5 there is a mild broad-based disc bulge. Right foraminal disc protrusion. Moderate-severe right foraminal stenosis. Mild left foraminal stenosis. 2. At C5-6 there is a central disc extrusion contacting the ventral cervical spinal cord. 3. No acute osseous injury of the cervical spine.    PATIENT SURVEYS:  NECK Pain & Disability Questionnaire  58% disability  COGNITION: Overall cognitive status:  Within functional limits for tasks assessed  SENSATION: Numbness & tingling in both hands - due to CTS in bil. Hands;   POSTURE: rounded shoulders and forward head  PALPATION: Tender to palpation bil. Upper traps and posterior cervical musc. - Rt side appears slightly more tender than Lt side   CERVICAL ROM:   Active ROM A/PROM (deg) eval  Flexion 28  Extension 42  Right lateral flexion 28  Left lateral flexion 38  Right rotation 44  Left rotation 50   (Blank rows = not tested)  UPPER EXTREMITY ROM:  WFL's bil. UE's   UPPER EXTREMITY MMT:  MMT Right eval Left eval  Shoulder flexion 4 4  Shoulder extension    Shoulder abduction 3+ 3+  Shoulder adduction    Shoulder extension    Shoulder internal rotation    Shoulder external rotation    Middle trapezius    Lower trapezius    Elbow flexion    Elbow extension    Wrist flexion    Wrist extension    Wrist ulnar deviation    Wrist radial deviation    Wrist pronation    Wrist supination    Grip strength     (Blank rows = not tested)  Pain with resistance with MMT  CERVICAL SPECIAL TESTS:  Cranial cervical flexion test: Positive active cervical flexion increases pain  FUNCTIONAL TESTS:  N/A  TODAY'S TREATMENT:  TherEx Supine thoracic mobilization over towel roll x 10 reps, some tingling down into R hand that does resolve at rest Cervical extension SNAG x 10 reps Median nerve glide x 5 reps B  Added median nerve glide to HEP, see bolded below  TherAct Trigger Point Dry-Needling  Treatment instructions: Expect mild to moderate muscle soreness. S/S of pneumothorax if dry needled over a lung field, and to seek immediate medical attention should they occur. Patient verbalized understanding of these instructions and education.  Patient Consent Given: Yes Education handout provided:  Yes Muscles treated: L and R suboccipitals, L and R splenius, L and R upper traps Treatment response/outcome: deep ache/muscle cramp; muscle twitch detected     PATIENT EDUCATION:  Education details: TPDN, added to HEP, PT POC with plan to d/c next session Person educated: Patient Education method: Explanation, Demonstration, and Handouts Education comprehension: verbalized understanding and returned demonstration  HOME EXERCISE PROGRAM: Medbridge Access Code: [email or web address] URL: [email or web address] Date: 12/21/2022 Prepared by: Maebelle Munroe  Exercises - Seated Assisted Cervical Rotation with Towel  - 1 x daily - 7 x weekly - 1 sets - 2 reps - 15-20 hold - Seated Cervical Traction  - 1 x daily - 7 x weekly - 1 sets - 2 reps - 15-20 hold - Seated Cervical Sidebending Stretch  - 1 x daily - 7 x weekly - 1 sets - 2-3 reps - 15 hold - Seated Cervical Retraction and Extension  - 1 x daily - 7 x weekly - 1 sets - 2-3 reps - Seated Upper Trapezius Stretch  - 1 x daily - 7 x weekly - 1 sets - 2-3 reps - 30 sec hold - Gentle Levator Scapulae Stretch  - 1 x daily - 7 x weekly - 1 sets - 2-3 reps - 30 sec hold - Median Nerve Tensioner  - 1 x daily - 7 x weekly - 3 sets - 5 reps  ASSESSMENT:  CLINICAL IMPRESSION: Emphasis of skilled PT session on performing TPDN and continuing to trial various exercises to address ongoing pain. Pt with good response to DN based on previous session. Pt does have positive neural tension testing for median nerve, added gentle nerve glides to HEP. Plan to assess LTG next session and d/c from OPPT if pt agreeable. Continue POC.     OBJECTIVE IMPAIRMENTS: decreased ROM, decreased strength, and pain.   ACTIVITY LIMITATIONS: carrying, lifting, sleeping, reach over head, and caring for others, and performing work activities - pt is unable to work the required 12 hour shift at the SNF due to increased pain  PARTICIPATION LIMITATIONS: cleaning, laundry,  driving, community activity, and occupation  PERSONAL FACTORS: Time since onset of injury/illness/exacerbation and severity of cervical disc protrusions and the "central disc extrusion contacting the ventral cervical spinal cord"    REHAB POTENTIAL: Fair due to severity of cervical disc protrusions - see MRI results above  CLINICAL DECISION MAKING: Stable/uncomplicated  EVALUATION COMPLEXITY: Low   GOALS: Goals reviewed with patient? Yes  SHORT TERM GOALS: = LTG's = 3 visits initially only per Medicaid authorization   LONG TERM GOALS: Target date: 01-15-23 (3 visits)  Pt will report decreased cervical pain to </= 7/10 at rest to increase comfort and ease with ADL's & work activities. Baseline: 9/10 at rest  Goal status: INITIAL  2.  Pt's disability % score on Neck Pain & Disability Questionnaire will improve to </= 44% to increase ease with work activities. Baseline: 58% on 12-21-22 Goal status: INITIAL  3.  Increase cervical rotation by at least 10 degrees for both Rt and Lt cervical rotation to increase ease with head turns with driving.  Baseline: Rt cervical rotation= 44 degrees:  Lt cervical rotation = 50 degrees Goal status: INITIAL  4.  Independent in HEP for cervical stretching and strengthening. Baseline: Dependent Goal status: INITIAL   PLAN:  PT FREQUENCY: 1x/week  PT DURATION: 3 weeks  PLANNED INTERVENTIONS: Therapeutic exercises, Therapeutic activity, Neuromuscular re-education, Patient/Family education, Self Care, Dry Needling, Electrical stimulation, Moist heat, Traction, and Manual therapy  PLAN FOR NEXT SESSION: determine D/C or renewal after initial 3 visits authorized    Peter Congo, PT, DPT, [facility] [address] [employer], [place]  [address] Phone:  [phone] Fax:  [phone]  01/07/2023, 10:32 AM  Check all possible CPT codes: 97110- Therapeutic Exercise, [phone]- Neuro Re-education, 97140 - Manual  Therapy, 97530 - Therapeutic Activities, 97535 - Self Care, and [phone] - Electrical stimulation (Manual)    Check all conditions that are expected to impact treatment: Musculoskeletal disorders   If treatment provided at initial evaluation, no treatment charged due to lack of authorization.

## 2023-01-11 ENCOUNTER — Encounter: Payer: Self-pay | Admitting: Family Medicine

## 2023-01-14 ENCOUNTER — Ambulatory Visit: Payer: Medicaid Other | Admitting: Physical Therapy

## 2023-01-14 ENCOUNTER — Encounter: Payer: Self-pay | Admitting: Family Medicine

## 2023-01-14 ENCOUNTER — Encounter: Payer: Self-pay | Admitting: Physical Therapy

## 2023-01-14 DIAGNOSIS — M542 Cervicalgia: Secondary | ICD-10-CM | POA: Diagnosis not present

## 2023-01-14 NOTE — Therapy (Signed)
OUTPATIENT PHYSICAL THERAPY CERVICAL TREATMENT/DISCHARGE SUMMARY   Patient Name: Crystal Garza MRN: [id number] DOB:Oct 23, 1970, 52 y.o., female Today's Date: 01/14/2023  END OF SESSION:  PT End of Session - 01/14/23 2029     Visit Number 4    Number of Visits 4   3 initial visits only per Medicaid authorization   Date for PT Re-Evaluation 01/29/23    Authorization Type Medicaid [place]    PT Start Time 0805    PT Stop Time 0843    PT Time Calculation (min) 38 min    Activity Tolerance Patient tolerated treatment well    Behavior During Therapy WFL for tasks assessed/performed                Past Medical History:  Diagnosis Date   Anemia    iron def   Anginal pain (HCC)    Anxiety    Arthritis    left knee    Back pain    Benign gastrointestinal stromal tumor (GIST)    Chronic knee pain    Depression    DVT (deep venous thrombosis) (HCC)    Esophageal dysmotility    GERD (gastroesophageal reflux disease)    Hiatal hernia    History of kidney stones    Hx of laparoscopic gastric banding    Hypertension    Internal hemorrhoids    Internal hemorrhoids    Migraine    Morbid obesity (HCC)    PE (pulmonary embolism)    Past Surgical History:  Procedure Laterality Date   49 HOUR PH STUDY N/A 05/13/2016   Procedure: 24 HOUR PH STUDY;  Surgeon: Ruffin Frederick, MD;  Location: [facility];  Service: Gastroenterology;  Laterality: N/A;   BIOPSY  02/16/2012   Procedure: BIOPSY;  Surgeon: Valarie Merino, MD;  Location: [facility];  Service: General;;  biopsy of mass x 2   BREATH TEK H PYLORI  07/28/2011   Procedure: BREATH TEK H PYLORI;  Surgeon: Valarie Merino, MD;  Location: [facility];  Service: General;  Laterality: N/A;  to be done at 745   CESAREAN SECTION  05/1991   COLONOSCOPY WITH PROPOFOL N/A 03/03/2016   Procedure: COLONOSCOPY WITH PROPOFOL;  Surgeon: Ruffin Frederick, MD;  Location: [facility];  Service: Gastroenterology;  Laterality:  N/A;   ESOPHAGEAL MANOMETRY N/A 05/13/2016   Procedure: ESOPHAGEAL MANOMETRY (EM);  Surgeon: Ruffin Frederick, MD;  [facility]: [facility];  Service: Gastroenterology;  Laterality: N/A;   ESOPHAGOGASTRODUODENOSCOPY N/A 08/29/2012   Procedure: ESOPHAGOGASTRODUODENOSCOPY (EGD);  Surgeon: Valarie Merino, MD;  [facility]: [facility];  Service: General;  Laterality: N/A;   ESOPHAGOGASTRODUODENOSCOPY N/A 09/04/2014   Procedure: ESOPHAGOGASTRODUODENOSCOPY (EGD);  Surgeon: Ovidio Kin, MD;  Location: [facility];  Service: General;  Laterality: N/A;   ESOPHAGOGASTRODUODENOSCOPY (EGD) WITH PROPOFOL N/A 03/03/2016   Procedure: ESOPHAGOGASTRODUODENOSCOPY (EGD) WITH PROPOFOL;  Surgeon: Ruffin Frederick, MD;  [facility];  Service: Gastroenterology;  Laterality: N/A;   EUS  03/03/2012   Procedure: UPPER ENDOSCOPIC ULTRASOUND (EUS) LINEAR;  Surgeon: Rachael Fee, MD;  [facility];  Service: Endoscopy;  Laterality: N/A;   GASTRIC ROUX-EN-Y N/A 12/07/2017   Procedure: LAPAROSCOPIC ROUX-EN-Y GASTRIC BYPASS WITH LYSIS OF ADHESIONS AND UPPER ENDOSCOPY ERAS Pathway;  Surgeon: Luretha Murphy, MD;  Location: [facility];  Service: General;  Laterality: N/A;   GIST TUMOR STOMACH N/A    KNEE ARTHROSCOPY     LAPAROSCOPIC GASTRECTOMY  04/08/2012   Procedure: LAPAROSCOPIC GASTRECTOMY;  Surgeon: Valarie Merino,  MD;  Location: [facility];  Service: General;;  removal of GIST tumor of stomach   LAPAROSCOPIC GASTRIC SLEEVE RESECTION N/A 08/29/2012   Procedure: LAPAROSCOPIC GASTRIC SLEEVE RESECTION;  Surgeon: Valarie Merino, MD;  Location: [facility];  Service: General;  Laterality: N/A;  Sleeve Gastrectomy   LEFT HEART CATH AND CORONARY ANGIOGRAPHY N/A 05/28/2017   Procedure: LEFT HEART CATH AND CORONARY ANGIOGRAPHY;  Surgeon: Marykay Lex, MD;  Location: [facility];  Service: Cardiovascular;  Laterality: N/A;   PANNICULECTOMY N/A 06/09/2021   Procedure: PANNICULECTOMY;  Surgeon:  Luretha Murphy, MD;  Location: [facility];  Service: General;  Laterality: N/A;   ROUX-EN-Y GASTRIC BYPASS     TOTAL ABDOMINAL HYSTERECTOMY  04/2004   TOTAL KNEE ARTHROPLASTY Left 10/06/2013   Procedure: LEFT TOTAL KNEE ARTHROPLASTY;  Surgeon: Kathryne Hitch, MD;  Location: [facility];  Service: Orthopedics;  Laterality: Left;   TUBAL LIGATION     Patient Active Problem List   Diagnosis Date Noted   Primary osteoarthritis of both feet 07/24/2021   DDD (degenerative disc disease), cervical 07/24/2021   Arthropathy of thoracic facet joint 07/24/2021   Fibromyalgia 07/24/2021   S/P abdominal  panniculectomy 06/09/2021   Chronic rhinitis 11/28/2020   Hx of food allergy 09/13/2020   Healthcare maintenance 09/13/2020   Constipation due to pain medication 03/06/2020   Cervical radiculopathy at C6 03/29/2019   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   History of pulmonary embolism 05/25/2017   Esophageal dysphagia    Family history of colon cancer    Macromastia 02/27/2015   Chronic neck and back pain 02/27/2015   Chronic anticoagulation 05/17/2014   Degenerative arthritis of left knee 10/06/2013   Status post total knee replacement 10/06/2013   Chronic pain 06/13/2013   Lap Sleeve Gastrectomy May 2014 09/16/2012   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) 02/17/2012   Nephrolithiasis 02/20/2011   Other mixed anxiety disorders 06/27/2010   Migraine 09/12/2009   BMI 45.0-49.9, adult (HCC) 10/30/2008   Major depressive disorder, recurrent episode (HCC) 07/01/2006   HYPERTENSION, BENIGN SYSTEMIC 07/01/2006   Gastroesophageal reflux disease 07/01/2006    PCP: Nestor Ramp., MD  REFERRING PROVIDER: Lisbeth Renshaw, MD  REFERRING DIAG: Diagnosis M50.90 (ICD-10-CM) - Cervical disc disorder, unspecified, unspecified cervical region  THERAPY DIAG:  Cervicalgia  Rationale for Evaluation and Treatment: Rehabilitation  ONSET DATE: 2023:  Referral  date  SUBJECTIVE:  SUBJECTIVE STATEMENT: Pt reports she lifted a patient at her job on Sunday and had an excruciating sharp pain in her Lt arm and side - says Lt arm went numb - pain was on her Lt side in her chest and then went around into her back.  Pt states she actually thought she may have been having a heart attack.  Pt says she finished her shift and then went home and went to bed and slept for a few hours- says pain lasted til yesterday but intensity lessened these past few days but prevented her from sleeping last night - says she didn't go to sleep until 6:00 this morning.    Pt reports the dry needling helped but was temporary - did not provide permanent pain relief.  Pt states she is not really interested in continuing PT at this time because she wants to treat the problem - MRI results say disc extrusion contacting cervical spinal cord; pt also wants to conserve PT visits in case more therapy is needed in the future - pt wonders if she needs surgery due to MRI results   Hand dominance: Right  PERTINENT HISTORY:  C6 Cervical radiculopathy, chronic neck and back pain, h/o PE, fibromyalgia, received injection for Rt trigger thumb on 12-18-22:  trigger point injection in Rt trapezius on 10-02-22  PAIN:  Are you having pain? Yes: NPRS scale: 7/10 Pain location: posterior cervical and into top of shoulders - both sides, but Rt side appears to be slightly more tender than Lt side Pain description: sharp, constant, achy Aggravating factors: work aggravates it Relieving factors: heat packs, analgesic gel  PRECAUTIONS: None  RED FLAGS: None     WEIGHT BEARING RESTRICTIONS: No  FALLS:  Has patient fallen in last 6 months? 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 At C4-5 there is a mild broad-based disc bulge. Right foraminal disc protrusion. Moderate-severe right foraminal stenosis. Mild left foraminal stenosis. 2. At C5-6 there is a central disc extrusion contacting the ventral cervical spinal cord. 3. No acute osseous injury of the cervical spine.    PATIENT SURVEYS:  NECK Pain & Disability Questionnaire  58% disability  COGNITION: Overall cognitive status: Within functional limits for tasks assessed  SENSATION: Numbness & tingling in both hands - due to CTS in bil. Hands;   POSTURE: rounded shoulders and forward head  PALPATION: Tender to palpation bil. Upper traps and posterior cervical musc. - Rt side appears slightly more tender than Lt side   CERVICAL ROM:   Active ROM A/PROM (deg) eval  Flexion 28  Extension 42  Right lateral flexion 28  Left lateral flexion 38  Right rotation 44  Left rotation 50   (Blank rows = not tested)  UPPER EXTREMITY ROM:  WFL's bil. UE's   UPPER EXTREMITY MMT:  MMT Right eval Left eval  Shoulder flexion 4 4  Shoulder extension    Shoulder abduction 3+ 3+  Shoulder adduction    Shoulder extension    Shoulder internal rotation    Shoulder external rotation    Middle trapezius    Lower trapezius    Elbow flexion    Elbow extension  Wrist flexion    Wrist extension    Wrist ulnar deviation    Wrist radial deviation    Wrist pronation    Wrist supination    Grip strength     (Blank rows = not tested)  Pain with resistance with MMT  CERVICAL SPECIAL TESTS:  Cranial cervical flexion test: Positive active cervical flexion increases pain  FUNCTIONAL TESTS:  N/A  TODAY'S TREATMENT:         Pt reports cervical pain 7/10 at start of session, states she took pain medication prior to appt.                                                                                                                          Reviewed HEP - pt reports no questions or issues with exercises  Lt cervical rotation 50 degrees:  Rt cervical rotation 54 degrees  Discussed LTG's; minimal progress in achieving pain relief; dry needling has helped but results have been temporary  Neck Pain & Disability questionnaire score 42%   PATIENT EDUCATION:  Education details: TPDN, added to HEP, PT POC with plan to d/c next session Person educated: Patient Education method: Explanation, Demonstration, and Handouts Education comprehension: verbalized understanding and returned demonstration  HOME EXERCISE PROGRAM: Medbridge Access Code: [email or web address] URL: [email or web address] Date: 12/21/2022 Prepared by: Maebelle Munroe  Exercises - Seated Assisted Cervical Rotation with Towel  - 1 x daily - 7 x weekly - 1 sets - 2 reps - 15-20 hold - Seated Cervical Traction  - 1 x daily - 7 x weekly - 1 sets - 2 reps - 15-20 hold - Seated Cervical Sidebending Stretch  - 1 x daily - 7 x weekly - 1 sets - 2-3 reps - 15 hold - Seated Cervical Retraction and Extension  - 1 x daily - 7 x weekly - 1 sets - 2-3 reps - Seated Upper Trapezius Stretch  - 1 x daily - 7 x weekly - 1 sets - 2-3 reps - 30 sec hold - Gentle Levator Scapulae Stretch  - 1 x daily - 7 x weekly - 1 sets - 2-3 reps - 30 sec hold - Median Nerve Tensioner  - 1 x daily - 7 x weekly - 3 sets - 5 reps   GOALS: Goals reviewed with patient? Yes  SHORT TERM GOALS: = LTG's = 3 visits initially only per Medicaid authorization   LONG TERM GOALS: Target date: 01-15-23 (3 visits)  Pt will report decreased cervical pain to </= 7/10 at rest to increase comfort and ease with ADL's & work activities. Baseline: 9/10 at rest; 7/10 on 01-14-23, pt states she took  pain medication this morning prior to appt. Goal status: Goal met 01-14-23  2.  Pt's disability % score on Neck Pain & Disability Questionnaire will improve to </= 44% to increase ease with work activities. Baseline: 58% on 12-21-22;   21/50 = 42% Goal status: Goal met 01-14-23  3.  Increase cervical rotation  by at least 10 degrees for both Rt and Lt cervical rotation to increase ease with head turns with driving.  Baseline: Rt cervical rotation= 44 degrees:  Lt cervical rotation = 50 degrees: 01-14-23:   Lt cervical rotation: 50 degrees:   Rt cervical rotation 54 degrees  Goal status: Partially met 01-14-23  4.  Independent in HEP for cervical stretching and strengthening. Baseline: Dependent Goal status: Goal met     ASSESSMENT:  CLINICAL IMPRESSION: Pt has met LTG's #1, 2 and 4:  LTG #3 partially met as Lt cervical rotation remains 50 degrees and Rt cervical rotation has increased from 44 to 54 degrees.  Pt continues to report pain in cervical region, radiating into bil. Arms with LUE > RUE.  Pt reports paresthesias in bil. Hands with Lt > Rt.  Dry needling and exercises have helped to reduce pain but results have been temporary.  MRI results are concerning as report states "C5-6 there is a central disc extrusion contacting the ventral cervical spinal cord".  Pt would benefit from additional neurosurgical consult as etiology of pain does not appear to be completely muscular at this time.   D/C per pt's request due to limited authorized visits by her insurance.      OBJECTIVE IMPAIRMENTS: decreased ROM, decreased strength, and pain.   ACTIVITY LIMITATIONS: carrying, lifting, sleeping, reach over head, and caring for others, and performing work activities - pt is unable to work the required 12 hour shift at the SNF due to increased pain  PARTICIPATION LIMITATIONS: cleaning, laundry, driving, community activity, and occupation  PERSONAL FACTORS: Time since onset of  injury/illness/exacerbation and severity of cervical disc protrusions and the "central disc extrusion contacting the ventral cervical spinal cord"   REHAB POTENTIAL: Fair due to severity of cervical disc protrusions - see MRI results above  CLINICAL DECISION MAKING: Stable/uncomplicated  EVALUATION COMPLEXITY: Low   PLAN:  PT FREQUENCY: 1x/week  PT DURATION: 3 weeks  PLANNED INTERVENTIONS: Therapeutic exercises, Therapeutic activity, Neuromuscular re-education, Patient/Family education, Self Care, Dry Needling, Electrical stimulation, Moist heat, Traction, and Manual therapy  PLAN FOR NEXT SESSION: D/C 01-14-23   PHYSICAL THERAPY DISCHARGE SUMMARY  Visits from Start of Care: 4  Current functional level related to goals / functional outcomes: See above for progress towards goals   Remaining deficits: Continued cervical pain with pt reporting radiating pain into Ue's and paresthesias in bil. Hands with Lt > Rt   Education / Equipment: Pt has been instructed in HEP for cervical ROM and stretches.   Patient agrees to discharge. Patient goals were partially met. Patient is being discharged due to the patient's request. Pt has limited # of authorized visits with her insurance.  Pt reports results have been temporary with PT and pt wishes to determine etiology of pain - wonders if surgery is needed due to MRI results.     Kerry Fort, PT  [facility] [address] [place], [place]  [address] Phone:  [phone] Fax:  [phone]  01/14/2023, 8:31 PM  Check all possible CPT codes: 97110- Therapeutic Exercise, [phone]- Neuro Re-education, [phone] - Manual Therapy, 97530 - Therapeutic Activities, [phone] - Self Care, and [phone] - Electrical stimulation (Manual)    Check all conditions that are expected to impact treatment: Musculoskeletal disorders   If treatment provided at initial evaluation, no treatment charged due to lack of authorization.

## 2023-01-20 ENCOUNTER — Other Ambulatory Visit: Payer: Self-pay

## 2023-01-20 DIAGNOSIS — G8929 Other chronic pain: Secondary | ICD-10-CM

## 2023-01-20 DIAGNOSIS — M5412 Radiculopathy, cervical region: Secondary | ICD-10-CM

## 2023-01-20 DIAGNOSIS — M5030 Other cervical disc degeneration, unspecified cervical region: Secondary | ICD-10-CM

## 2023-01-28 ENCOUNTER — Other Ambulatory Visit: Payer: Self-pay | Admitting: Family Medicine

## 2023-01-28 MED ORDER — OXYCODONE-ACETAMINOPHEN 10-325 MG PO TABS: ORAL_TABLET | ORAL | 0 refills | Status: DC

## 2023-02-07 ENCOUNTER — Emergency Department (HOSPITAL_COMMUNITY): Payer: 59

## 2023-02-07 ENCOUNTER — Emergency Department (HOSPITAL_COMMUNITY)
Admission: EM | Admit: 2023-02-07 | Discharge: 2023-02-08 | Disposition: A | Discharge status: Home / Self Care | Payer: 59 | Attending: Emergency Medicine | Admitting: Emergency Medicine

## 2023-02-07 ENCOUNTER — Other Ambulatory Visit: Payer: Self-pay

## 2023-02-07 DIAGNOSIS — R079 Chest pain, unspecified: Secondary | ICD-10-CM | POA: Diagnosis present

## 2023-02-07 DIAGNOSIS — I10 Essential (primary) hypertension: Secondary | ICD-10-CM | POA: Diagnosis not present

## 2023-02-07 DIAGNOSIS — W1830XA Fall on same level, unspecified, initial encounter: Secondary | ICD-10-CM | POA: Insufficient documentation

## 2023-02-07 DIAGNOSIS — M25571 Pain in right ankle and joints of right foot: Secondary | ICD-10-CM | POA: Diagnosis not present

## 2023-02-07 DIAGNOSIS — M25561 Pain in right knee: Secondary | ICD-10-CM | POA: Insufficient documentation

## 2023-02-07 DIAGNOSIS — Z79899 Other long term (current) drug therapy: Secondary | ICD-10-CM | POA: Diagnosis not present

## 2023-02-07 DIAGNOSIS — Z7901 Long term (current) use of anticoagulants: Secondary | ICD-10-CM | POA: Insufficient documentation

## 2023-02-07 DIAGNOSIS — R42 Dizziness and giddiness: Secondary | ICD-10-CM | POA: Diagnosis not present

## 2023-02-07 DIAGNOSIS — W19XXXA Unspecified fall, initial encounter: Secondary | ICD-10-CM

## 2023-02-07 LAB — BASIC METABOLIC PANEL
Anion gap: 9 (ref 5–15)
BUN: 10 mg/dL (ref 6–20)
CO2: 24 mmol/L (ref 22–32)
Calcium: 9 mg/dL (ref 8.9–10.3)
Chloride: 108 mmol/L (ref 98–111)
Creatinine, Ser: 0.9 mg/dL (ref 0.44–1.00)
GFR, Estimated: >60 mL/min (ref >60)
Glucose, Bld: 88 mg/dL (ref 70–99)
Potassium: 3.7 mmol/L (ref 3.5–5.1)
Sodium: 141 mmol/L (ref 135–145)

## 2023-02-07 LAB — CBC
HCT: 37.8 % (ref 36.0–46.0)
Hemoglobin: 11.6 g/dL — ABNORMAL LOW (ref 12.0–15.0)
MCH: 26 pg (ref 26.0–34.0)
MCHC: 30.7 g/dL (ref 30.0–36.0)
MCV: 84.8 fL (ref 80.0–100.0)
Platelets: 420 10*3/uL — ABNORMAL HIGH (ref 150–400)
RBC: 4.46 MIL/uL (ref 3.87–5.11)
RDW: 14.6 % (ref 11.5–15.5)
WBC: 11.8 10*3/uL — ABNORMAL HIGH (ref 4.0–10.5)
nRBC: 0 % (ref 0.0–0.2)

## 2023-02-07 LAB — TROPONIN I (HIGH SENSITIVITY)
Troponin I (High Sensitivity): 3 ng/L (ref <18)
Troponin I (High Sensitivity): 3 ng/L (ref <18)

## 2023-02-07 MED ORDER — ASPIRIN 325 MG PO TABS
325.0000 mg | ORAL_TABLET | Freq: Every day | ORAL | Status: DC
  Administered 2023-02-08: 325 mg via ORAL
  Filled 2023-02-07: qty 1

## 2023-02-07 MED ORDER — IOHEXOL 350 MG/ML SOLN
75.0000 mL | Freq: Once | INTRAVENOUS | Status: AC | PRN
  Administered 2023-02-07: 75 mL via INTRAVENOUS

## 2023-02-07 MED ORDER — NITROGLYCERIN 0.4 MG SL SUBL: 0.4000 mg | SUBLINGUAL_TABLET | SUBLINGUAL | Status: DC | PRN

## 2023-02-07 NOTE — ED Notes (Signed)
Patient transported to X-ray 

## 2023-02-07 NOTE — ED Provider Notes (Signed)
[place] EMERGENCY DEPARTMENT AT [facility] Provider Note   CSN: [id number] Arrival date & time: 02/07/23  8295     History {Add pertinent medical, surgical, social history, OB history to HPI:1} Chief Complaint  Patient presents with   Chest Pain    Crystal Garza is a 52 y.o. female.  HPI      Since 12PM chest pain, like squeezing, releasing like a blood pressure cuff, essentially constant. Nothing makes it better or worse. 2-3 weeks ago had it for 4 days and went away A little shortness of breath, diaphoresis, no nausea no vomiting, no cough, leg pain or swelling (before the chest pain, did fall and hurt leg later today.) .  Can feel it more with deep breaths. Worse with exertion.  Worse sitting up.   Got up and felt lightheaded, fell did not hit head but did hit knee and ankle   Hx of PE/DVT, GIST, hth,  No chol, no dm Dad had CHF, died at age 20 Uncle has heart condition wanted them tested for  No smoking, etoh other drugs  Now left chest with a cramping type pain   Past Medical History:  Diagnosis Date   Anemia    iron def   Anginal pain (HCC)    Anxiety    Arthritis    left knee    Back pain    Benign gastrointestinal stromal tumor (GIST)    Chronic knee pain    Depression    DVT (deep venous thrombosis) (HCC)    Esophageal dysmotility    GERD (gastroesophageal reflux disease)    Hiatal hernia    History of kidney stones    Hx of laparoscopic gastric banding    Hypertension    Internal hemorrhoids    Internal hemorrhoids    Migraine    Morbid obesity (HCC)    PE (pulmonary embolism)     Home Medications Prior to Admission medications   Medication Sig Start Date End Date Taking? Authorizing Provider  ALPRAZolam Prudy Feeler) 1 MG tablet Take one tablet up to four times a day as needed for anxiety Patient taking differently: Take 1-2 mg by mouth See admin instructions. Take 2 mg by mouth at bedtime and an additional 1-2 mg in the  morning 11/26/22  Yes Nestor Ramp, MD  diclofenac Sodium (VOLTAREN) 1 % GEL Apply 4 g topically 4 (four) times daily. Patient taking differently: Apply 4 g topically See admin instructions. Apply 4 grams to painful sites four times a day 08/21/22  Yes Littie Deeds, MD  DULoxetine (CYMBALTA) 20 MG capsule Take 1 capsule (20 mg total) by mouth daily. 12/31/22  Yes Nestor Ramp, MD  ELIQUIS 2.5 MG TABS tablet Take 1 tablet (2.5 mg total) by mouth 2 (two) times daily. Patient taking differently: Take 2.5 mg by mouth 2 (two) times daily. 09/30/22  Yes Nestor Ramp, MD  lisinopril (ZESTRIL) 10 MG tablet TAKE ONE TABLET BY MOUTH DAILY 08/03/22  Yes Nestor Ramp, MD  oxyCODONE-acetaminophen (PERCOCET) 10-325 MG tablet TAKE ONE TABLET BY MOUTH EVERY 4 TO 6 HOURS AS NEEDED CHRONIC PAIN Patient taking differently: Take 1 tablet by mouth See admin instructions. Take 1 tablet by mouth every four to six hours as needed for chronic pain 01/28/23  Yes Nestor Ramp, MD  pantoprazole (PROTONIX) 40 MG tablet Take 1 tablet (40 mg total) by mouth 2 (two) times daily. 08/14/21  Yes Armbruster, Willaim Rayas, MD  Allergies    Cyclobenzaprine, Medrol [methylprednisolone], Codeine, and Tuna [fish allergy]    Review of Systems   Review of Systems  Physical Exam Updated Vital Signs BP (!) 143/95 (BP Location: Left Arm)   Pulse 80   Temp 99 F (37.2 C) (Oral)   Resp 18   Ht 5\' 7"  (1.702 m)   Wt 129.3 kg   SpO2 99%   BMI 44.64 kg/m  Physical Exam  ED Results / Procedures / Treatments   Labs (all labs ordered are listed, but only abnormal results are displayed) Labs Reviewed  CBC - Abnormal; Notable for the following components:      Result Value   WBC 11.8 (*)    Hemoglobin 11.6 (*)    Platelets 420 (*)    All other components within normal limits  BASIC METABOLIC PANEL  TROPONIN I (HIGH SENSITIVITY)  TROPONIN I (HIGH SENSITIVITY)    EKG EKG Interpretation Date/Time:  Sunday February 07 2023 19:05:52  EDT Ventricular Rate:  83 PR Interval:  156 QRS Duration:  91 QT Interval:  351 QTC Calculation: 413 R Axis:   37  Text Interpretation: Sinus rhythm Low voltage, precordial leads No significant change since last tracing Confirmed by Alvira Monday ([id number]) on 02/07/2023 7:38:40 PM  Radiology DG Chest 2 View  Result Date: 02/07/2023 CLINICAL DATA:  Left-sided chest pain and pressure EXAM: CHEST - 2 VIEW COMPARISON:  PA and lateral 09/11/2022 FINDINGS: The heart size and mediastinal contours are within normal limits. Both lungs are clear. The visualized skeletal structures are intact with degenerative changes and slight dextroscoliosis of the thoracic spine. IMPRESSION: No active cardiopulmonary disease.  Stable chest. Electronically Signed   By: Almira Bar M.D.   On: 02/07/2023 20:41    Procedures Procedures  {Document cardiac monitor, telemetry assessment procedure when appropriate:1}  Medications Ordered in ED Medications - No data to display  ED Course/ Medical Decision Making/ A&P   {   Click here for ABCD2, HEART and other calculatorsREFRESH Note before signing :1}                              Medical Decision Making Amount and/or Complexity of Data Reviewed Labs: ordered. Radiology: ordered.   ***  {Document critical care time when appropriate:1} {Document review of labs and clinical decision tools ie heart score, Chads2Vasc2 etc:1}  {Document your independent review of radiology images, and any outside records:1} {Document your discussion with family members, caretakers, and with consultants:1} {Document social determinants of health affecting pt's care:1} {Document your decision making why or why not admission, treatments were needed:1} Final Clinical Impression(s) / ED Diagnoses Final diagnoses:  None    Rx / DC Orders ED Discharge Orders     None

## 2023-02-07 NOTE — ED Triage Notes (Signed)
Onset of left sided chest pressure that started around noon today while at work. Some arm pain, chronic back pain. Pt also says she was getting up to go to the bathroom and fell, c/o pain in the right knee and right ankle.

## 2023-02-08 MED ORDER — ALUM & MAG HYDROXIDE-SIMETH 200-200-20 MG/5ML PO SUSP
30.0000 mL | Freq: Once | ORAL | Status: AC
  Administered 2023-02-08: 30 mL via ORAL
  Filled 2023-02-08: qty 30

## 2023-02-08 MED ORDER — LIDOCAINE VISCOUS HCL 2 % MT SOLN
15.0000 mL | Freq: Once | OROMUCOSAL | Status: AC
  Administered 2023-02-08: 15 mL via ORAL
  Filled 2023-02-08: qty 15

## 2023-02-08 NOTE — ED Provider Notes (Signed)
I assumed care of this patient from previous provider.  Please see their note for further details of history, exam, and MDM.   Briefly patient is a 52 y.o. female who presented with intermittent chest pain.  History of prior blood clots.  Currently awaiting CT PE study and delta troponin.  Patient also had a mechanical fall and currently awaiting CT head and plain films of the right lower extremity.  Delta troponin negative. CTA negative for PE, pneumonia, pneumothorax.  She does have small hiatal hernia which may be contributing to the intermittent chest discomfort.  Pain did not improve with GI cocktail.  CT head negative for ICH.  Plain films of the right knee and ankle negative.  The patient appears reasonably screened and/or stabilized for discharge and I doubt any other medical condition or other [facility] requiring further screening, evaluation, or treatment in the ED at this time. I have discussed the findings, Dx and Tx plan with the patient/family who expressed understanding and agree(s) with the plan. Discharge instructions discussed at length. The patient/family was given strict return precautions who verbalized understanding of the instructions. No further questions at time of discharge.  Disposition: Discharge  Condition: Good  ED Discharge Orders          Ordered    Ambulatory referral to Cardiology       Comments: If you have not heard from the Cardiology office within the next 72 hours please call [phone].   02/08/23 0239             Follow Up: Nestor Ramp, MD [address] [place] [address] [phone]  Call  for help establishing care with a care provider, to schedule an appointment for close follow up         Kimesha Claxton, Amadeo Garnet, MD 02/08/23 [phone]

## 2023-02-25 ENCOUNTER — Other Ambulatory Visit: Payer: Self-pay | Admitting: Family Medicine

## 2023-02-26 MED ORDER — OXYCODONE-ACETAMINOPHEN 10-325 MG PO TABS: ORAL_TABLET | ORAL | 0 refills | Status: DC

## 2023-02-27 ENCOUNTER — Encounter: Payer: Self-pay | Admitting: Family Medicine

## 2023-03-02 ENCOUNTER — Telehealth: Payer: Self-pay

## 2023-03-02 ENCOUNTER — Ambulatory Visit: Payer: Medicaid Other | Admitting: Student

## 2023-03-02 NOTE — Telephone Encounter (Signed)
Patient calls nurse line regarding PA being needed on percocet prescription.   PA submitted through [employer]. Medication approved 03/02/2023 - 08/29/2023.   PA number: [id number]  Will call pharmacy with approval.   Veronda Prude, RN

## 2023-03-02 NOTE — Telephone Encounter (Signed)
Returned call to patient. She elects to go ahead and cancel appointment for this afternoon.   She states that she has been taking pain medication as prescribed and using voltaren gel. She continues to have back pain.   She has follow up with Dr. Jennette Kettle on 11/8.  Patient requesting returned call from PCP to discuss next steps.   *Patient is also asking for letter regarding last TB test in January.   Letter sent via mychart.   Veronda Prude, RN

## 2023-03-02 NOTE — Telephone Encounter (Signed)
Spoke w pt Pain in low back now---not had this type of burning pain in >3y since last steroid shots) Discussed options Will see her tomorrow afternoon for nurse visit and try depomedrol inj as she has had gastric bypass and would like to avoid steroids orally Likely will need to go back for ESI in next few weeks

## 2023-03-02 NOTE — Telephone Encounter (Signed)
Patient called nurse line regarding issues with back pain.   Pain medicine required PA. See separate phone note. Scheduled patient for evaluation of back pain this afternoon.   Upon further evaluation when completing PA, patient has been receiving back injections at [facility]. Spoke with Dr. Jennette Kettle about patient appointment this afternoon.   Patient will not be able to receive injections at our office. Attempted to call patient to determine if she still wants to keep appointment or cancel due to not being able to perform injections if office.   LVM as patient did not answer. Will attempt to reach patient at later time, prior to appointment.   Veronda Prude, RN

## 2023-03-03 ENCOUNTER — Ambulatory Visit: Payer: Medicaid Other

## 2023-03-03 ENCOUNTER — Other Ambulatory Visit: Payer: Self-pay | Admitting: Family Medicine

## 2023-03-03 DIAGNOSIS — M5440 Lumbago with sciatica, unspecified side: Secondary | ICD-10-CM

## 2023-03-03 MED ORDER — METHYLPREDNISOLONE ACETATE 80 MG/ML IJ SUSP
80.0000 mg | Freq: Once | INTRAMUSCULAR | Status: AC
Start: 2023-03-04 — End: 2023-03-04
  Administered 2023-03-04: 80 mg via INTRAMUSCULAR

## 2023-03-03 NOTE — Progress Notes (Unsigned)
I have placed order for you to give Crystal Garza an injection of methylprednisolone 80 mg/ml IM tomorrow as we discussed. THANKS! Denny Levy

## 2023-03-04 ENCOUNTER — Ambulatory Visit (INDEPENDENT_AMBULATORY_CARE_PROVIDER_SITE_OTHER): Payer: Medicaid Other

## 2023-03-04 DIAGNOSIS — M5440 Lumbago with sciatica, unspecified side: Secondary | ICD-10-CM

## 2023-03-04 DIAGNOSIS — M542 Cervicalgia: Secondary | ICD-10-CM

## 2023-03-04 NOTE — Progress Notes (Signed)
Patient presents to nurse clinic for DepoMedrol injection.   Patient reports low back pain and an intolerance to oral steroids. Injection tolerated well.    Patient to call for a clinic visit if no relief.

## 2023-03-05 ENCOUNTER — Encounter: Payer: Self-pay | Admitting: Family Medicine

## 2023-03-05 DIAGNOSIS — Z139 Encounter for screening, unspecified: Secondary | ICD-10-CM

## 2023-03-08 ENCOUNTER — Other Ambulatory Visit: Payer: Self-pay

## 2023-03-08 ENCOUNTER — Encounter (HOSPITAL_COMMUNITY): Payer: Self-pay | Admitting: *Deleted

## 2023-03-08 ENCOUNTER — Ambulatory Visit (HOSPITAL_COMMUNITY)
Admission: EM | Admit: 2023-03-08 | Discharge: 2023-03-08 | Disposition: A | Discharge status: Home / Self Care | Payer: Self-pay | Attending: Internal Medicine | Admitting: Internal Medicine

## 2023-03-08 ENCOUNTER — Ambulatory Visit (INDEPENDENT_AMBULATORY_CARE_PROVIDER_SITE_OTHER): Payer: Self-pay

## 2023-03-08 DIAGNOSIS — S83402A Sprain of unspecified collateral ligament of left knee, initial encounter: Secondary | ICD-10-CM

## 2023-03-08 DIAGNOSIS — S60222A Contusion of left hand, initial encounter: Secondary | ICD-10-CM
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
## 2023-03-08 NOTE — Discharge Instructions (Addendum)
X-ray of your hand is negative for fracture Rest and elevate the affected painful area.   Apply cold compresses intermittently as needed.   This take pain medications as directed As pain recedes, begin normal activities slowly as tolerated.  Return to urgent care if symptoms persist.

## 2023-03-12 ENCOUNTER — Ambulatory Visit: Payer: Medicaid Other | Admitting: Family Medicine

## 2023-03-12 ENCOUNTER — Encounter: Payer: Self-pay | Admitting: Family Medicine

## 2023-03-12 VITALS — BP 127/46 | Ht 67.0 in | Wt 285.0 lb

## 2023-03-12 DIAGNOSIS — M5412 Radiculopathy, cervical region: Secondary | ICD-10-CM | POA: Diagnosis not present

## 2023-03-12 DIAGNOSIS — G8929 Other chronic pain: Secondary | ICD-10-CM

## 2023-03-12 DIAGNOSIS — M549 Dorsalgia, unspecified: Secondary | ICD-10-CM

## 2023-03-12 DIAGNOSIS — Z7901 Long term (current) use of anticoagulants: Secondary | ICD-10-CM | POA: Diagnosis present

## 2023-03-12 DIAGNOSIS — M542 Cervicalgia: Secondary | ICD-10-CM | POA: Diagnosis not present

## 2023-03-12 DIAGNOSIS — F413 Other mixed anxiety disorders: Secondary | ICD-10-CM | POA: Diagnosis not present
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
## 2023-03-13 ENCOUNTER — Encounter: Payer: Self-pay | Admitting: Family Medicine

## 2023-03-13 NOTE — Assessment & Plan Note (Signed)
Reviewed her dosing. She is still having symptoms but I do not feel comfortable increasing her dose any further. We discussed and she is OK with current plan. Reviwed PDRMP and showed her the level of risk she is currently at.

## 2023-03-13 NOTE — Assessment & Plan Note (Signed)
Spine surgeon offered PT.

## 2023-03-13 NOTE — Assessment & Plan Note (Signed)
Reviewed her chronic anticoagulation as someone at recent outside providers visit questioned her dosing. It is a good question and we discussed. We reviewed the E[employer] phase 3 trial that was basis for approval of 10 mg dosing in patients s/p 180 days traditional treatment. In her very complex situation, 10 mg dose is appropriate. Appreciate her questions and answered them.

## 2023-03-13 NOTE — Assessment & Plan Note (Signed)
Second opinion from [facility] spine surgeon agreed no surgical intervention at this time.

## 2023-03-17 ENCOUNTER — Emergency Department (HOSPITAL_COMMUNITY): Payer: Medicaid Other

## 2023-03-17 ENCOUNTER — Emergency Department (HOSPITAL_COMMUNITY)
Admission: EM | Admit: 2023-03-17 | Discharge: 2023-03-17 | Disposition: A | Discharge status: Home / Self Care | Payer: Medicaid Other | Attending: Emergency Medicine | Admitting: Emergency Medicine

## 2023-03-17 ENCOUNTER — Encounter (HOSPITAL_COMMUNITY): Payer: Self-pay

## 2023-03-17 DIAGNOSIS — R1032 Left lower quadrant pain: Secondary | ICD-10-CM | POA: Diagnosis not present

## 2023-03-17 DIAGNOSIS — M5442 Lumbago with sciatica, left side: Secondary | ICD-10-CM

## 2023-03-17 DIAGNOSIS — R1030 Lower abdominal pain, unspecified: Secondary | ICD-10-CM | POA: Diagnosis present

## 2023-03-17 DIAGNOSIS — I10 Essential (primary) hypertension: Secondary | ICD-10-CM | POA: Insufficient documentation

## 2023-03-17 DIAGNOSIS — Z79899 Other long term (current) drug therapy: Secondary | ICD-10-CM | POA: Insufficient documentation

## 2023-03-17 DIAGNOSIS — N390 Urinary tract infection, site not specified: Secondary | ICD-10-CM

## 2023-03-17 DIAGNOSIS — Z7901 Long term (current) use of anticoagulants: Secondary | ICD-10-CM | POA: Insufficient documentation

## 2023-03-17 LAB — COMPREHENSIVE METABOLIC PANEL
ALT: 17 U/L (ref 0–44)
AST: 18 U/L (ref 15–41)
Albumin: 3.6 g/dL (ref 3.5–5.0)
Alkaline Phosphatase: 77 U/L (ref 38–126)
Anion gap: 10 (ref 5–15)
BUN: 8 mg/dL (ref 6–20)
CO2: 26 mmol/L (ref 22–32)
Calcium: 9.2 mg/dL (ref 8.9–10.3)
Chloride: 104 mmol/L (ref 98–111)
Creatinine, Ser: 0.64 mg/dL (ref 0.44–1.00)
GFR, Estimated: >60 mL/min (ref >60)
Glucose, Bld: 83 mg/dL (ref 70–99)
Potassium: 3.4 mmol/L — ABNORMAL LOW (ref 3.5–5.1)
Sodium: 140 mmol/L (ref 135–145)
Total Bilirubin: 0.8 mg/dL (ref <1.2)
Total Protein: 7.9 g/dL (ref 6.5–8.1)

## 2023-03-17 LAB — CBC WITH DIFFERENTIAL/PLATELET
Abs Immature Granulocytes: 0.03 10*3/uL (ref 0.00–0.07)
Basophils Absolute: 0.1 10*3/uL (ref 0.0–0.1)
Basophils Relative: 1 %
Eosinophils Absolute: 0.1 10*3/uL (ref 0.0–0.5)
Eosinophils Relative: 1 %
HCT: 41.6 % (ref 36.0–46.0)
Hemoglobin: 12.4 g/dL (ref 12.0–15.0)
Immature Granulocytes: 0 %
Lymphocytes Relative: 27 %
Lymphs Abs: 3.2 10*3/uL (ref 0.7–4.0)
MCH: 25.7 pg — ABNORMAL LOW (ref 26.0–34.0)
MCHC: 29.8 g/dL — ABNORMAL LOW (ref 30.0–36.0)
MCV: 86.3 fL (ref 80.0–100.0)
Monocytes Absolute: 0.6 10*3/uL (ref 0.1–1.0)
Monocytes Relative: 6 %
Neutro Abs: 7.7 10*3/uL (ref 1.7–7.7)
Neutrophils Relative %: 65 %
Platelets: 489 10*3/uL — ABNORMAL HIGH (ref 150–400)
RBC: 4.82 MIL/uL (ref 3.87–5.11)
RDW: 15.2 % (ref 11.5–15.5)
WBC: 11.7 10*3/uL — ABNORMAL HIGH (ref 4.0–10.5)
nRBC: 0 % (ref 0.0–0.2)

## 2023-03-17 LAB — URINALYSIS, W/ REFLEX TO CULTURE (INFECTION SUSPECTED)
Bilirubin Urine: NEGATIVE
Glucose, UA: NEGATIVE mg/dL
Ketones, ur: NEGATIVE mg/dL
Leukocytes,Ua: NEGATIVE
Nitrite: POSITIVE — AB
Protein, ur: NEGATIVE mg/dL
Specific Gravity, Urine: 1.02 (ref 1.005–1.030)
pH: 5 (ref 5.0–8.0)

## 2023-03-17 MED ORDER — OXYCODONE-ACETAMINOPHEN 5-325 MG PO TABS
2.0000 | ORAL_TABLET | Freq: Once | ORAL | Status: AC
  Administered 2023-03-17: 2 via ORAL
  Filled 2023-03-17: qty 2

## 2023-03-17 MED ORDER — HYDROMORPHONE HCL 1 MG/ML IJ SOLN
1.0000 mg | Freq: Once | INTRAMUSCULAR | Status: AC
  Administered 2023-03-17: 1 mg via INTRAVENOUS
  Filled 2023-03-17: qty 1

## 2023-03-17 MED ORDER — CEPHALEXIN 500 MG PO CAPS: 500.0000 mg | ORAL_CAPSULE | Freq: Two times a day (BID) | ORAL | 0 refills | Status: AC

## 2023-03-17 MED ORDER — CEPHALEXIN 500 MG PO CAPS
500.0000 mg | ORAL_CAPSULE | Freq: Once | ORAL | Status: AC
  Administered 2023-03-17: 500 mg via ORAL
  Filled 2023-03-17: qty 1

## 2023-03-17 MED ORDER — ONDANSETRON HCL 4 MG/2ML IJ SOLN
4.0000 mg | Freq: Once | INTRAMUSCULAR | Status: AC
  Administered 2023-03-17: 4 mg via INTRAVENOUS
  Filled 2023-03-17: qty 2

## 2023-03-17 NOTE — ED Provider Notes (Signed)
Patient signed out to me at 1700 by Dr. Anitra Lauth pending CT read.  In short this is a 52 year old female with a past medical history of fibromyalgia and chronic back pain, VTE on Eliquis presenting to the emergency department for worsening back pain.  She reports today her pain is more in her left side and going down her left leg and into the left side of her abdomen with associated nausea.  Patient had labs and urine performed.  UA did have some squames but was nitrite positive with many bacteria concerning for UTI.  CT abdomen and pelvis did not show acute disease, she has no CVA tenderness for me on exam, tenderness is more in the lower lumbar paraspinal muscles, no neurologic deficits.  She will be treated with Keflex.  She will be given additional dose of pain control.  She does have pain medication at home and was recommended to follow-up at the pain clinic.   Rexford Maus, DO 03/17/23 1758

## 2023-03-17 NOTE — ED Triage Notes (Signed)
Pt c/o L lower back pain radiating into L leg and L abdomen and nausea x2 days.  Pain score 8/10.  Pt reports taking prescribed pain medication and muscle relaxer w/o relief.

## 2023-03-17 NOTE — Discharge Instructions (Signed)
You were seen in the emergency department for your back pain.  Your urine is positive for urinary tract infection however your CAT scan showed no signs of kidney stones or kidney infection.  Your pain is probably a combination of your infection and likely sciatica in your back.  I have given you a prescription of antibiotics he should complete this as prescribed.  You can continue to take your home pain medications as prescribed.  You should follow-up with your primary doctor and your pain doctor to have your symptoms rechecked.  You should return to the emergency department if you having fevers despite the antibiotics, repetitive vomiting and unable to tolerate the antibiotics, you have new numbness or weakness, you are unable to urinate, you are unable to walk or if you have any other new or concerning symptoms.

## 2023-03-17 NOTE — ED Provider Notes (Signed)
[place] EMERGENCY DEPARTMENT AT [facility] Provider Note   CSN: [id number] Arrival date & time: 03/17/23  1115     History {Add pertinent medical, surgical, social history, OB history to HPI:1} Chief Complaint  Patient presents with   Back Pain   Nausea   Abdominal Pain    Crystal Garza is a 52 y.o. female.  Patient is a 52 year old female with a history of hypertension, prior gastric bypass, PE on Eliquis, chronic pain in both the neck and the back who is presenting today with complaints of worsening pain in her left flank that radiates into her left abdomen and down her leg.  She reports that the pain started more significantly yesterday and has only worsened.  She tried to go to work today and reported the pain was so bad she had to leave.  It is exacerbated by movement but is severe with certain positions of sitting as well.  She does feel that it goes all the way down to her toe but she has never experienced it in her back and abdomen like this in the past.  She also feels that there is muscle spasm in addition.  She try to take the oxycodone and muscle relaxer but had minimal improvement.  She has not had any urinary or bowel changes.  No weakness in the leg.  She denies any fever or prior back surgeries.  She has had gastric bypass but reports no significant complications from that surgery.  She has not had any vomiting.  The history is provided by the patient and medical records.  Back Pain Associated symptoms: abdominal pain   Abdominal Pain      Home Medications Prior to Admission medications   Medication Sig Start Date End Date Taking? Authorizing Provider  ALPRAZolam Prudy Feeler) 1 MG tablet Take one tablet up to four times a day as needed for anxiety Patient taking differently: Take 1-2 mg by mouth See admin instructions. Take 2 mg by mouth at bedtime and an additional 1-2 mg in the morning 11/26/22   Nestor Ramp, MD  diclofenac Sodium (VOLTAREN) 1 %  GEL Apply 4 g topically 4 (four) times daily. Patient taking differently: Apply 4 g topically See admin instructions. Apply 4 grams to painful sites four times a day 08/21/22   Littie Deeds, MD  DULoxetine (CYMBALTA) 20 MG capsule Take 1 capsule (20 mg total) by mouth daily. 12/31/22   Nestor Ramp, MD  ELIQUIS 2.5 MG TABS tablet Take 1 tablet (2.5 mg total) by mouth 2 (two) times daily. Patient taking differently: Take 2.5 mg by mouth 2 (two) times daily. 09/30/22   Nestor Ramp, MD  lisinopril (ZESTRIL) 10 MG tablet TAKE ONE TABLET BY MOUTH DAILY 08/03/22   Nestor Ramp, MD  oxyCODONE-acetaminophen (PERCOCET) 10-325 MG tablet TAKE ONE TABLET BY MOUTH EVERY 4 TO 6 HOURS AS NEEDED CHRONIC PAIN 02/26/23   Nestor Ramp, MD  pantoprazole (PROTONIX) 40 MG tablet Take 1 tablet (40 mg total) by mouth 2 (two) times daily. 08/14/21   Armbruster, Willaim Rayas, MD      Allergies    Cyclobenzaprine, Medrol [methylprednisolone], Codeine, and Louisa Bing allergy]    Review of Systems   Review of Systems  Gastrointestinal:  Positive for abdominal pain.  Musculoskeletal:  Positive for back pain.    Physical Exam Updated Vital Signs BP (!) 141/93 (BP Location: Right Arm)   Pulse 80   Temp 98.4 F (36.9 C) (Oral)  Resp 18   Ht 5\' 7"  (1.702 m)   Wt 129.3 kg   SpO2 100%   BMI 44.64 kg/m  Physical Exam Vitals and nursing note reviewed.  Constitutional:      General: She is not in acute distress.    Appearance: She is well-developed.  HENT:     Head: Normocephalic and atraumatic.  Eyes:     Conjunctiva/sclera: Conjunctivae normal.     Pupils: Pupils are equal, round, and reactive to light.  Cardiovascular:     Rate and Rhythm: Normal rate and regular rhythm.     Heart sounds: No murmur heard. Pulmonary:     Effort: Pulmonary effort is normal. No respiratory distress.     Breath sounds: Normal breath sounds. No wheezing or rales.  Abdominal:     General: There is no distension.     Palpations:  Abdomen is soft.     Tenderness: There is abdominal tenderness in the left lower quadrant. There is left CVA tenderness and guarding. There is no rebound.  Musculoskeletal:        General: No tenderness. Normal range of motion.     Cervical back: Normal range of motion and neck supple.     Left lower leg: No edema.     Comments: Pain in the leg with movement of the left hip.  2+ DP pulse in the left foot  Skin:    General: Skin is warm and dry.     Findings: No erythema or rash.  Neurological:     General: No focal deficit present.     Mental Status: She is alert and oriented to person, place, and time.     Sensory: No sensory deficit.     Motor: No weakness.  Psychiatric:        Mood and Affect: Mood normal.        Behavior: Behavior normal.     ED Results / Procedures / Treatments   Labs (all labs ordered are listed, but only abnormal results are displayed) Labs Reviewed  COMPREHENSIVE METABOLIC PANEL  CBC WITH DIFFERENTIAL/PLATELET  URINALYSIS, W/ REFLEX TO CULTURE (INFECTION SUSPECTED)    EKG None  Radiology No results found.  Procedures Procedures  {Document cardiac monitor, telemetry assessment procedure when appropriate:1}  Medications Ordered in ED Medications  ondansetron (ZOFRAN) injection 4 mg (has no administration in time range)  HYDROmorphone (DILAUDID) injection 1 mg (has no administration in time range)    ED Course/ Medical Decision Making/ A&P   {   Click here for ABCD2, HEART and other calculatorsREFRESH Note before signing :1}                              Medical Decision Making Amount and/or Complexity of Data Reviewed Labs: ordered. Radiology: ordered.  Risk Prescription drug management.   Pt with multiple medical problems and comorbidities and presenting today with a complaint that caries a high risk for morbidity and mortality.  Here today with pain in the left back, abdomen and goes down her leg.  Concern for renal stone versus  diverticulitis versus musculoskeletal etiology and radiculopathy.  No findings to suggest cellulitis today.  Pulses are intact and low suspicion for DVT or vascular abnormality.  Patient is chronically anticoagulated and low suspicion for ischemia.  To suggest cauda equina and neurologically intact here.  Patient given pain control and labs and imaging pending.   {Document critical care time when appropriate:1} {  Document review of labs and clinical decision tools ie heart score, Chads2Vasc2 etc:1}  {Document your independent review of radiology images, and any outside records:1} {Document your discussion with family members, caretakers, and with consultants:1} {Document social determinants of health affecting pt's care:1} {Document your decision making why or why not admission, treatments were needed:1} Final Clinical Impression(s) / ED Diagnoses Final diagnoses:  None    Rx / DC Orders ED Discharge Orders     None

## 2023-03-18 ENCOUNTER — Encounter: Payer: Self-pay | Admitting: Family Medicine

## 2023-03-18 ENCOUNTER — Ambulatory Visit: Payer: Medicaid Other | Admitting: Student

## 2023-03-18 ENCOUNTER — Ambulatory Visit
Admission: RE | Admit: 2023-03-18 | Discharge: 2023-03-18 | Disposition: A | Discharge status: Home / Self Care | Payer: Medicaid Other | Source: Ambulatory Visit | Attending: Family Medicine | Admitting: Family Medicine

## 2023-03-18 ENCOUNTER — Other Ambulatory Visit: Payer: Self-pay | Admitting: Family Medicine

## 2023-03-18 VITALS — BP 113/81 | HR 74 | Ht 67.0 in

## 2023-03-18 DIAGNOSIS — M545 Low back pain, unspecified: Secondary | ICD-10-CM

## 2023-03-18 DIAGNOSIS — F419 Anxiety disorder, unspecified: Secondary | ICD-10-CM

## 2023-03-18 DIAGNOSIS — M5440 Lumbago with sciatica, unspecified side: Secondary | ICD-10-CM | POA: Diagnosis present

## 2023-03-18 MED ORDER — TIZANIDINE HCL 2 MG PO CAPS: 2.0000 mg | ORAL_CAPSULE | Freq: Three times a day (TID) | ORAL | 0 refills | Status: DC | PRN

## 2023-03-18 MED ORDER — METHOCARBAMOL 500 MG PO TABS: 500.0000 mg | ORAL_TABLET | Freq: Every evening | ORAL | 0 refills | Status: DC | PRN

## 2023-03-18 MED ORDER — LIDOCAINE 4 % EX PTCH
1.0000 | MEDICATED_PATCH | CUTANEOUS | 0 refills | Status: DC
Start: 2023-03-18 — End: 2023-04-19
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
## 2023-03-18 NOTE — Patient Instructions (Signed)
It was great to see you! Thank you for allowing me to participate in your care!   Our plans for today:  -I am going to send in a different muscle relaxer for you to try -I am also going to send in a lidocaine patch to put on your back and I would recommend heating pads as well -I looked at your lumbar x-ray and I do not see any acute fractures but we need to wait for the official read  Take care and seek immediate care sooner if you develop any concerns.  Levin Erp, MD

## 2023-03-19 ENCOUNTER — Encounter: Payer: Self-pay | Admitting: Family Medicine

## 2023-03-19 ENCOUNTER — Other Ambulatory Visit: Payer: Self-pay | Admitting: *Deleted

## 2023-03-19 ENCOUNTER — Encounter: Payer: Self-pay | Admitting: *Deleted

## 2023-03-19 ENCOUNTER — Other Ambulatory Visit: Payer: Self-pay | Admitting: Family Medicine

## 2023-03-19 DIAGNOSIS — G8929 Other chronic pain: Secondary | ICD-10-CM

## 2023-03-19 DIAGNOSIS — M545 Low back pain, unspecified: Secondary | ICD-10-CM

## 2023-03-19 NOTE — Progress Notes (Unsigned)
Megan or Neeton I have placed an order for MRI of her lumbar spine. Xrays are done and abnormal. She has acute on chronic pain, unspecified whether or not she has sciatica. Can you please coordinate getting this scheduled. She is aware. I would also like to set her up for facet joint injections or epidural steroid injection (not sure if I can order it that way and let them decide or if I have to specify. If I have to specify I would say facet joint injections lumbar spine. THANKS! Let me know what else you need. Huntley Dec

## 2023-03-22 ENCOUNTER — Other Ambulatory Visit: Payer: Self-pay | Admitting: *Deleted

## 2023-03-22 ENCOUNTER — Encounter: Payer: Self-pay | Admitting: Family Medicine

## 2023-03-24 ENCOUNTER — Other Ambulatory Visit: Payer: Self-pay | Admitting: Family Medicine

## 2023-03-25 MED ORDER — OXYCODONE-ACETAMINOPHEN 10-325 MG PO TABS: ORAL_TABLET | ORAL | 0 refills | Status: DC

## 2023-03-26 ENCOUNTER — Encounter: Payer: Self-pay | Admitting: Family Medicine

## 2023-03-26 ENCOUNTER — Ambulatory Visit (INDEPENDENT_AMBULATORY_CARE_PROVIDER_SITE_OTHER): Payer: Medicaid Other | Admitting: Family Medicine

## 2023-03-26 VITALS — BP 141/48 | Ht 67.0 in | Wt 285.0 lb

## 2023-03-26 DIAGNOSIS — M5442 Lumbago with sciatica, left side: Secondary | ICD-10-CM

## 2023-03-27 NOTE — Progress Notes (Signed)
  Crystal Garza - 52 y.o. female MRN [id number]  Date of birth: 09-27-1970    SUBJECTIVE:      Chief Complaint:/ HPI:  If can increase in her low back pain.  She can barely get up out of a chair.  She was unable to push the grocery cart the other day when she was in the grocery store as the cart got full, she had to have her daughter push it.  She says the pain is doubled or tripled since we talked last time.  Pretty much any movement makes it hurt.  She has been out of work and not sure she can go back which is also stressing her as she needs the job.  PERTINENT  PMH / PSH: I have reviewed the patient's medications, allergies, past medical and surgical history, smoking status.  Pertinent findings that relate to today's visit / issues include: Reviewed her lumbar films which have not been formally read yet.  See comments in the overview section I made previously.  I reviewed the films with her.   OBJECTIVE: BP (!) 141/48   Ht 5\' 7"  (1.702 m)   Wt 285 lb (129.3 kg)   BMI 44.64 kg/m   Physical Exam:  Vital signs are reviewed. GENERAL: Well-developed, no acute distress BACK: Tender to palpation all the musculature of the lumbar area.  Forward flexion increases pain.  Any type of flexion or extension increases pain.  Lower extremity strength is 5 out of 5.  Intact sensation lower extremities.  DTRs 2+ bilateral symmetrical at knee.  ASSESSMENT & PLAN: 1.  Low back pain exacerbation: I reviewed the films with her.  I have concerns she has endplate damage on L4-5 L5-S1 and that this is causing her pain and also concerned that she has listhesis as she is having intermittent issues of acute radiculopathy.  We have an MRI scheduled for December 2.  I do not think we can get 1 any sooner.  Unclear how long will be before we get the formal read on her lumbar spine and this makes me concerned for the formal read on her MRI.  I think the best thing we can do right now is to take her totally out of  work and not have her do any lifting or pushing.  I told her basically to tell her family that she was a "(" and that they should treat her as such.  I will fill out FMLA paperwork for her.  Continue current pain management.  She will let me know if things get worse. Total 40 minutes spent in review of her case, examination and discussion of options.

## 2023-03-29 ENCOUNTER — Other Ambulatory Visit: Payer: Self-pay | Admitting: Student

## 2023-03-29 DIAGNOSIS — M5440 Lumbago with sciatica, unspecified side: Secondary | ICD-10-CM

## 2023-03-29 MED ORDER — METHOCARBAMOL 500 MG PO TABS: 500.0000 mg | ORAL_TABLET | Freq: Every evening | ORAL | 1 refills | Status: DC | PRN

## 2023-03-30 ENCOUNTER — Encounter: Payer: Self-pay | Admitting: Family Medicine

## 2023-04-02 ENCOUNTER — Encounter: Payer: Self-pay | Admitting: Family Medicine

## 2023-04-05 ENCOUNTER — Ambulatory Visit
Admission: RE | Admit: 2023-04-05 | Discharge: 2023-04-05 | Disposition: A | Discharge status: Home / Self Care | Payer: Medicaid Other | Source: Ambulatory Visit | Attending: Family Medicine | Admitting: Family Medicine

## 2023-04-05 DIAGNOSIS — G8929 Other chronic pain: Secondary | ICD-10-CM

## 2023-04-05 DIAGNOSIS — M545 Low back pain, unspecified: Secondary | ICD-10-CM

## 2023-04-09 ENCOUNTER — Telehealth: Payer: Self-pay

## 2023-04-10 ENCOUNTER — Encounter: Payer: Self-pay | Admitting: Family Medicine

## 2023-04-13 ENCOUNTER — Ambulatory Visit: Payer: Medicaid Other

## 2023-04-13 ENCOUNTER — Ambulatory Visit (INDEPENDENT_AMBULATORY_CARE_PROVIDER_SITE_OTHER): Payer: Medicaid Other | Admitting: Podiatry

## 2023-04-13 ENCOUNTER — Encounter: Payer: Self-pay | Admitting: Podiatry

## 2023-04-13 ENCOUNTER — Ambulatory Visit (INDEPENDENT_AMBULATORY_CARE_PROVIDER_SITE_OTHER): Payer: Medicaid Other

## 2023-04-13 DIAGNOSIS — M7752 Other enthesopathy of left foot: Secondary | ICD-10-CM | POA: Diagnosis not present

## 2023-04-13 DIAGNOSIS — D492 Neoplasm of unspecified behavior of bone, soft tissue, and skin: Secondary | ICD-10-CM

## 2023-04-13 DIAGNOSIS — M778 Other enthesopathies, not elsewhere classified: Secondary | ICD-10-CM

## 2023-04-13 NOTE — Patient Instructions (Signed)
Take dressing off in 8 hours and wash the foot with soap and water. If it is hurting or becomes uncomfortable before the 8 hours, go ahead and remove the bandage and wash the area.  If it blisters, apply antibiotic ointment and a band-aid.  Monitor for any signs/symptoms of infection. Call the office immediately if any occur or go directly to the emergency room. Call with any questions/concerns.   

## 2023-04-13 NOTE — Progress Notes (Signed)
Subjective: Chief Complaint  Patient presents with   Foot Pain    RM#11 One under the left bunion and one on the right side both with pain.   52 year old female presents the office the above concerns.  She states that she has a painful lesion on her left foot wound submetatarsal 5.  Hurts with pressure.  No drainage or pus or any swelling.  She also gets some lesions on the right foot which are causing discomfort.  No open lesions that she reports.  No other concerns.  Objective: AAO x3, NAD DP/PT pulses palpable bilaterally, CRT less than 3 seconds On the left foot submetatarsal 5 is a punctate annular hyperkeratotic lesion.  There is no edema, erythema or signs of infection.  There is no drainage or pus.  No fluctuation or crepitation. Minimal callus formation lateral aspect right foot without any underlying ulceration drainage of infection. No pain with calf compression, swelling, warmth, erythema  Assessment: Skin lesion left foot  Plan: -All treatment options discussed with the patient including all alternatives, risks, complications.  -Serpe debrided lesion left foot any complications or bleeding.  Cleaned the skin with alcohol.  Cantharone applied over the close advance care close procedure first discussed.  Monitor for any signs or symptoms of infection. -As a courtesy debride other lesions on the right foot any complications or bleeding.  Moisturizer.  Patient much smaller. -Patient encouraged to call the office with any questions, concerns, change in symptoms.   Return if symptoms worsen or fail to improve.  Vivi Barrack DPM

## 2023-04-15 ENCOUNTER — Other Ambulatory Visit: Payer: Self-pay | Admitting: Family Medicine

## 2023-04-15 NOTE — Patient Instructions (Addendum)
Here is your appointment with Dr. [facility] Estill Bamberg, MD Address: [address], [place], [place] [address] Phone: [phone] Friday 04/23/23 @ 245p

## 2023-04-15 NOTE — Progress Notes (Unsigned)
Neeton or Megan Can you get her: Appt with Dr Yevette Edwards for eval of low back pain, has had MRI and it is abnormal Appt with [facility]) for evaluation for epidural steroid injection THANKS! Denny Levy

## 2023-04-16 ENCOUNTER — Ambulatory Visit: Payer: Medicaid Other | Admitting: Family Medicine

## 2023-04-16 VITALS — BP 118/66 | Ht 67.0 in | Wt 285.0 lb

## 2023-04-16 DIAGNOSIS — M545 Low back pain, unspecified: Secondary | ICD-10-CM

## 2023-04-16 DIAGNOSIS — M5442 Lumbago with sciatica, left side: Secondary | ICD-10-CM

## 2023-04-17 NOTE — Progress Notes (Signed)
  Crystal Garza - 52 y.o. female MRN [id number]  Date of birth: 1971-01-13    SUBJECTIVE:      Chief Complaint:/ HPI:  Radicular left-sided back pain in the left leg.  Not getting much better.  Here for further discussion about next steps.  Also concerned that she is not to be able to go back to work at the end of the month.    OBJECTIVE: BP 118/66   Ht 5\' 7"  (1.702 m)   Wt 285 lb (129.3 kg)   BMI 44.64 kg/m   Physical Exam:  Vital signs are reviewed. GENERAL: Well-developed, uncomfortable but no acute distress.  Gait is antalgic.  ASSESSMENT & PLAN: Low back pain with left radiculopathy, abnormal MRI.  Will set her up to see Dr. Yevette Edwards for evaluation of long-term solution.  Will set her up for lumbar epidural steroid see if we can get her some pain relief sooner.  I will be happy to extend her leave if she needs it.  Concern you current pain medicine regimen.  I will follow-up with her in a week by phone. See problem based charting & AVS for pt instructions. No problem-specific Assessment & Plan notes found for this encounter.

## 2023-04-19 ENCOUNTER — Encounter: Payer: Self-pay | Admitting: Internal Medicine

## 2023-04-19 ENCOUNTER — Ambulatory Visit: Payer: Medicaid Other | Attending: Internal Medicine | Admitting: Internal Medicine

## 2023-04-19 VITALS — BP 98/60 | HR 112 | Resp 16 | Ht 67.0 in | Wt 285.0 lb

## 2023-04-19 DIAGNOSIS — R072 Precordial pain: Secondary | ICD-10-CM

## 2023-04-19 NOTE — Patient Instructions (Signed)
Medication Instructions:  No changes  *If you need a refill on your cardiac medications before your next appointment, please call your pharmacy*   Follow-Up: At [facility], you and your health needs are our priority.  As part of our continuing mission to provide you with exceptional heart care, we have created designated Provider Care Teams.  These Care Teams include your primary Cardiologist (physician) and Advanced Practice Providers (APPs -  Physician Assistants and Nurse Practitioners) who all work together to provide you with the care you need, when you need it.   Your next appointment:   Follow up as needed   Provider:   [facility]

## 2023-04-19 NOTE — Progress Notes (Signed)
  Cardiology Office Note:  .   Date:  04/19/2023  ID:  Sherle Poe, DOB 1971/04/28, MRN [id number] PCP: Nestor Ramp, MD  [facility] Providers Cardiologist:  None    History of Present Illness: .   Crystal Garza is a 52 y.o. female history of fibromyalgia and chronic back pain referral for intermittent chest pain In 2018 she was having issues with chest pain that were concerning for possible angina . She has had a left heart cath in 05/28/2017, this showed normal course and moderately elevated LVEDP 20 mmHg.  Her symptoms were thought possibly related to microvascular disease. She saw Dr. Tomie China in February 2019, she had poorly controlled hypertension.  She had a sharp pain on her left side it comes and goes, it's been the last 2-3 months. It's been the same.  GM had congestive heart failure in her 80s. No premature CAD.  ROS:  per HPI otherwise negative   Studies Reviewed: Marland Kitchen    EKG Interpretation Date/Time:  Monday April 19 2023 10:40:25 EST Ventricular Rate:  105 PR Interval:  148 QRS Duration:  68 QT Interval:  316 QTC Calculation: 417 R Axis:   48  Text Interpretation: Sinus tachycardia When compared with ECG of 07-Feb-2023 19:05, PREVIOUS ECG IS PRESENT Confirmed by Carolan Clines ([id number]) on 04/19/2023 10:45:05 AM  Risk Assessment/Calculations:    The 10-year ASCVD risk score (Arnett DK, et al., 2019) is: 1.1%   Values used to calculate the score:     Age: 29 years     Sex: Female     Is Non-Hispanic African American: Yes     Diabetic: No     Tobacco smoker: No     Systolic Blood Pressure: 98 mmHg     Is BP treated: Yes     HDL Cholesterol: 69 mg/dL     Total Cholesterol: 161 mg/dL   Physical Exam:   VS:  BP 98/60 (BP Location: Left Arm, Patient Position: Sitting, Cuff Size: Large)   Pulse (!) 112   Resp 16   Ht 5\' 7"  (1.702 m)   Wt 285 lb (129.3 kg)   SpO2 98%   BMI 44.64 kg/m    Wt Readings from Last 3 Encounters:  04/19/23 285 lb  (129.3 kg)  04/16/23 285 lb (129.3 kg)  03/26/23 285 lb (129.3 kg)    GEN: Well nourished, well developed in no acute distress NECK: No JVD; No carotid bruits CARDIAC: RRR, no murmurs, rubs, gallops RESPIRATORY:  Clear to auscultation without rales, wheezing or rhonchi  ABDOMEN: Soft, non-tender, non-distended EXTREMITIES:  No edema; No deformity   ASSESSMENT AND PLAN: .   Noncardiac chest pain Sharp chest pain is quite atypical for cardiac disease Her CVD risk is very low with an ASCVD of 1.1%.  Her ECG shows sinus tachycardia which we can see with obesity otherwise no signs of ischemic or structural heart disease I do not suspect this is related to cardiac disease.  No further workup is needed at this time   Obesity S/p gastric bypass; was 400. Has maintained the same weight for over a year Has back pain that limites her walking Mentioned ozempic, she will consider discussing with her PCP      Dispo: Follow up PRN  Signed, Maisie Fus, MD

## 2023-04-20 ENCOUNTER — Other Ambulatory Visit: Payer: Self-pay | Admitting: Family Medicine

## 2023-04-21 MED ORDER — OXYCODONE-ACETAMINOPHEN 10-325 MG PO TABS: ORAL_TABLET | ORAL | 0 refills | Status: DC

## 2023-04-23 ENCOUNTER — Encounter: Payer: Self-pay | Admitting: Family Medicine

## 2023-04-26 ENCOUNTER — Encounter: Payer: Self-pay | Admitting: Family Medicine

## 2023-04-26 ENCOUNTER — Ambulatory Visit: Payer: Medicaid Other | Admitting: Family Medicine

## 2023-04-26 VITALS — BP 127/53 | HR 80 | Ht 67.0 in | Wt 288.8 lb

## 2023-04-26 DIAGNOSIS — Z7901 Long term (current) use of anticoagulants: Secondary | ICD-10-CM | POA: Diagnosis present

## 2023-04-26 DIAGNOSIS — M5416 Radiculopathy, lumbar region: Secondary | ICD-10-CM

## 2023-04-26 MED ORDER — OXYCODONE HCL ER 20 MG PO T12A: 20.0000 mg | EXTENDED_RELEASE_TABLET | Freq: Two times a day (BID) | ORAL | 0 refills | Status: DC

## 2023-04-26 MED ORDER — OXYCODONE-ACETAMINOPHEN 10-325 MG PO TABS: ORAL_TABLET | ORAL | 0 refills | Status: DC

## 2023-04-27 ENCOUNTER — Encounter: Payer: Self-pay | Admitting: Family Medicine

## 2023-04-27 DIAGNOSIS — M5416 Radiculopathy, lumbar region: Secondary | ICD-10-CM | POA: Insufficient documentation

## 2023-04-27 NOTE — Progress Notes (Signed)
    CHIEF COMPLAINT / HPI:  Back pain really painful. Has seen the orthopedist  (PA) and they are considering some type of injection. Will need to go off of her DOAC for 5 days prior and needs clearance. Has taken more of her pain med and will run out early. Not sure what to do. Cannot get around well at all, particularly rising from chair is problematic. Standing for any length of time (> 5 min> or bending over not really possible at all. Definitely not able to return to work. Finances are tight.     PERTINENT  PMH / PSH: I have reviewed the patient's medications, allergies, past medical and surgical history, smoking status and updated in the EMR as appropriate. MRI: 04/05/2023: Modic type 2 endplate signal changes at L4-5.  New moderate right and mild left L5-S1 neural foraminal stenosis due to progressive disc bulge. 2. Mild bilateral L4-5 neural foraminal stenosis.  OBJECTIVE:  BP (!) 127/53   Pulse 80   Ht 5\' 7"  (1.702 m)   Wt 288 lb 12.8 oz (131 kg)   SpO2 100%   BMI 45.23 kg/m   GEN WD WN moderate pain Antalgic gait, stooped forward posture. Lot of difficulty sitting and then getting up from chair (needs assistive device and extra time) PSYCH: AxOx4. Good eye contact.. No psychomotor retardation or agitation. Appropriate speech fluency and content. Asks and answers questions appropriately. Mood is congruent.  ASSESSMENT / PLAN:   Chronic anticoagulation OK to stop DOAC for 5 days prior to interventions. I will fill out paperwork and fax to ortho.  Lumbar radiculopathy, acute Certainly in subacute phase with significant increase in pain. 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 Will extend her FMLA paperwork. Copy of letter to chart and will fill out forms with update if they need me to. Cleared for intervention re DOAC. Tried to transition her to long acting pain med.  Several conversations with her pharmacist and was told her insurance would require a PA an even then very unlikely to approve. Given hooliday closures tomorrow and rest of week, we decided to just fill her current rx early. She is aware of risks with opioids, especially with chronic use of benzodiazepines and we re-discussed those again. She is in a tough spot and doing all of the right things to address her conditions.  35 minutes spent in visit.   Denny Levy MD

## 2023-04-27 NOTE — Assessment & Plan Note (Signed)
OK to stop DOAC for 5 days prior to interventions. I will fill out paperwork and fax to ortho.

## 2023-04-27 NOTE — Assessment & Plan Note (Addendum)
Certainly in subacute phase with significant increase in pain. 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 Will extend her FMLA paperwork. Copy of letter to chart and will fill out forms with update if they need me to. Cleared for intervention re DOAC. Tried to transition her to long acting pain med. Several conversations with her pharmacist and was told her insurance would require a PA an even then very unlikely to approve. Given hooliday closures tomorrow and rest of week, we decided to just fill her current rx early. She is aware of risks with opioids, especially with chronic use of benzodiazepines and we re-discussed those again. She is in a tough spot and doing all of the right things to address her conditions.  35 minutes spent in visit.

## 2023-04-29 ENCOUNTER — Other Ambulatory Visit: Payer: Self-pay | Admitting: Orthopedic Surgery

## 2023-04-29 ENCOUNTER — Encounter: Payer: Self-pay | Admitting: Family Medicine

## 2023-04-29 DIAGNOSIS — M545 Low back pain, unspecified: Secondary | ICD-10-CM

## 2023-04-30 ENCOUNTER — Ambulatory Visit: Payer: Medicaid Other

## 2023-05-03 ENCOUNTER — Ambulatory Visit: Payer: Medicaid Other

## 2023-05-03 DIAGNOSIS — Z111 Encounter for screening for respiratory tuberculosis: Secondary | ICD-10-CM

## 2023-05-03 NOTE — Progress Notes (Cosign Needed)
Patient is here for a PPD placement.  PPD placed in left forearm @ 0915 am.  Patient will return 05/06/2023 to have PPD read. Veronda Prude, RN

## 2023-05-06 ENCOUNTER — Ambulatory Visit (INDEPENDENT_AMBULATORY_CARE_PROVIDER_SITE_OTHER): Payer: Medicaid Other

## 2023-05-06 DIAGNOSIS — Z111 Encounter for screening for respiratory tuberculosis: Secondary | ICD-10-CM

## 2023-05-06 LAB — TB SKIN TEST
Induration: 0 mm
TB Skin Test: NEGATIVE

## 2023-05-06 NOTE — Progress Notes (Signed)
 Patient is here for a PPD read.  It was placed on 05/04/2023 in the left forearm @ 0915 am.    PPD RESULTS:  Result: negative Induration: 0 mm  Letter created and given to patient for documentation purposes. Veronda Prude, RN

## 2023-05-10 ENCOUNTER — Other Ambulatory Visit: Payer: Self-pay

## 2023-05-11 ENCOUNTER — Ambulatory Visit (INDEPENDENT_AMBULATORY_CARE_PROVIDER_SITE_OTHER): Payer: Medicaid Other | Admitting: Gastroenterology

## 2023-05-11 ENCOUNTER — Encounter: Payer: Self-pay | Admitting: Gastroenterology

## 2023-05-11 VITALS — BP 100/70 | HR 67 | Ht 67.0 in | Wt 290.0 lb

## 2023-05-11 DIAGNOSIS — R152 Fecal urgency: Secondary | ICD-10-CM | POA: Diagnosis not present

## 2023-05-11 DIAGNOSIS — R197 Diarrhea, unspecified: Secondary | ICD-10-CM

## 2023-05-11 DIAGNOSIS — R1084 Generalized abdominal pain: Secondary | ICD-10-CM | POA: Diagnosis not present

## 2023-05-11 DIAGNOSIS — Z9884 Bariatric surgery status: Secondary | ICD-10-CM | POA: Diagnosis not present

## 2023-05-11 MED ORDER — CHOLESTYRAMINE 4 G PO PACK: 4.0000 g | PACK | Freq: Every day | ORAL | 3 refills | Status: AC

## 2023-05-11 MED ORDER — DICYCLOMINE HCL 10 MG PO CAPS: 10.0000 mg | ORAL_CAPSULE | Freq: Two times a day (BID) | ORAL | 3 refills | Status: AC

## 2023-05-11 NOTE — Discharge Instructions (Signed)

## 2023-05-11 NOTE — Progress Notes (Signed)
 05/11/2023 Crystal Garza [id number] 1970-07-22   HISTORY OF PRESENT ILLNESS: This is a 53 year old female who is a patient of Dr. Hassan who has been seen and evaluated by him extensively for dysphagia and reflux issues.  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  She is here today for complaints of generalized abdominal pain and diarrhea/loose stool with urgency.  She says that she does not want everybody keeps saying that she is constipated, probably because she takes pain medications, but she is not constipated she has diarrhea/loose stool with urgency.  No blood in her stool.  Her gallbladder is not placed.  Colonoscopy 12/2020: - One 4 mm polyp in the transverse colon, removed with a cold snare. Resected and retrieved. - Non- bleeding internal hemorrhoids. - The examined portion of the ileum was normal.  EGD 12/2020: - Normal esophagus otherwise. - Roux- en- Y gastrojejunostomy with gastrojejunal anastomosis characterized by healthy appearing mucosa, no ulcerations noted. - Gastric pouch is normal - biopsies taken to rule out H pylori - Normal examined small bowel limb.  No overt cause for symptoms on this exam, some improvement with upper tract symptoms on Dexilant .  Surgical [P], gastric pouch biopsy - MILD REACTIVE GASTROPATHY. [place]OD PHLEGM IS NEGATIVE FOR HELICOBACTER PYLORI. - NO INTESTINAL METAPLASIA, DYSPLASIA, OR MALIGNANCY.  Results of manometry and 24 Hr pH impedance studies Esophageal manometry - quality of study is decreased by double swallows - normal resting LES pressure with complete relaxation - 100% of swallows had peristaltic contractions, but 50% of swallows had large breaks in peristalsis   Overall, suboptimal study due to double swallows however fragmented peristalsis noted with some poor bolus clearance, but no other major abnormality, no achalasia   24 HR pH impedance study: Study performed  ON PPI (Dexilant )   Results: - Study time of 24 hrs: adequate study time - Demeester score of 5.5 (14.72 = upper limit of normal) - no evidence of pathologic reflux - Symptom index of only 33% (3/9) for acid reflux episodes, and 44% (4/9) for all reflux episodes - most reflux episodes that occurred were weakly acidic (32/42)   Summary and Interpretation: - study consistent with controlled acid reflux disease on PPI - symptoms reported did NOT correlate well with reflux episodes - there could be a component of functional heartburn to symptoms in addition to nonspecific changes noted on manometry    Past Medical History:  Diagnosis Date   Anemia    iron def   Anginal pain (HCC)    Anxiety    Arthritis    left knee    Back pain    Benign gastrointestinal stromal tumor (GIST)    Chronic knee pain    Depression    DVT (deep venous thrombosis) (HCC)    Esophageal dysmotility    GERD (gastroesophageal reflux disease)    Hiatal hernia    History of kidney stones    Hx of laparoscopic gastric banding    Hypertension    Internal hemorrhoids    Internal hemorrhoids    Migraine    Morbid obesity (HCC)    PE (pulmonary embolism)    Past Surgical History:  Procedure Laterality Date   15 HOUR PH STUDY N/A 05/13/2016   Procedure: 24 HOUR PH STUDY;  Surgeon: Elspeth Deward Naval, MD;  Location: [facility];  Service: Gastroenterology;  Laterality: N/A;   BIOPSY  02/16/2012   Procedure: BIOPSY;  Surgeon:  Donnice KATHEE Lunger, MD;  Location: [facility];  Service: General;;  biopsy of mass x 2   BREATH TEK H PYLORI  07/28/2011   Procedure: BREATH TEK H PYLORI;  Surgeon: Donnice KATHEE Lunger, MD;  Location: [facility];  Service: General;  Laterality: N/A;  to be done at 745   CESAREAN SECTION  05/1991   COLONOSCOPY WITH PROPOFOL  N/A 03/03/2016   Procedure: COLONOSCOPY WITH PROPOFOL ;  Surgeon: Elspeth Deward Naval, MD;  Location: [facility];  Service: Gastroenterology;  Laterality: N/A;    ESOPHAGEAL MANOMETRY N/A 05/13/2016   Procedure: ESOPHAGEAL MANOMETRY (EM);  Surgeon: Elspeth Deward Naval, MD;  Location: [facility];  Service: Gastroenterology;  Laterality: N/A;   ESOPHAGOGASTRODUODENOSCOPY N/A 08/29/2012   Procedure: ESOPHAGOGASTRODUODENOSCOPY (EGD);  Surgeon: Donnice KATHEE Lunger, MD;  Location: [facility];  Service: General;  Laterality: N/A;   ESOPHAGOGASTRODUODENOSCOPY N/A 09/04/2014   Procedure: ESOPHAGOGASTRODUODENOSCOPY (EGD);  Surgeon: Alm Angle, MD;  Location: [facility];  Service: General;  Laterality: N/A;   ESOPHAGOGASTRODUODENOSCOPY (EGD) WITH PROPOFOL  N/A 03/03/2016   Procedure: ESOPHAGOGASTRODUODENOSCOPY (EGD) WITH PROPOFOL ;  Surgeon: Elspeth Deward Naval, MD;  Location: [facility];  Service: Gastroenterology;  Laterality: N/A;   EUS  03/03/2012   Procedure: UPPER ENDOSCOPIC ULTRASOUND (EUS) LINEAR;  Surgeon: Toribio SHAUNNA Cedar, MD;  Location: [facility];  Service: Endoscopy;  Laterality: N/A;   GASTRIC ROUX-EN-Y N/A 12/07/2017   Procedure: LAPAROSCOPIC ROUX-EN-Y GASTRIC BYPASS WITH LYSIS OF ADHESIONS AND UPPER ENDOSCOPY ERAS Pathway;  Surgeon: Lunger Donnice, MD;  Location: [facility];  Service: General;  Laterality: N/A;   GIST TUMOR STOMACH N/A    KNEE ARTHROSCOPY     LAPAROSCOPIC GASTRECTOMY  04/08/2012   Procedure: LAPAROSCOPIC GASTRECTOMY;  Surgeon: Donnice KATHEE Lunger, MD;  Location: [facility];  Service: General;;  removal of GIST tumor of stomach   LAPAROSCOPIC GASTRIC SLEEVE RESECTION N/A 08/29/2012   Procedure: LAPAROSCOPIC GASTRIC SLEEVE RESECTION;  Surgeon: Donnice KATHEE Lunger, MD;  Location: [facility];  Service: General;  Laterality: N/A;  Sleeve Gastrectomy   LEFT HEART CATH AND CORONARY ANGIOGRAPHY N/A 05/28/2017   Procedure: LEFT HEART CATH AND CORONARY ANGIOGRAPHY;  Surgeon: Anner Alm ORN, MD;  Location[facility] CV [facility];  Service: Cardiovascular;  Laterality: N/A;   PANNICULECTOMY N/A 06/09/2021   Procedure: PANNICULECTOMY;  Surgeon: Lunger Donnice, MD;  Location: [facility];  Service: General;  Laterality: N/A;   ROUX-EN-Y GASTRIC BYPASS     TOTAL ABDOMINAL HYSTERECTOMY  04/2004   TOTAL KNEE ARTHROPLASTY Left 10/06/2013   Procedure: LEFT TOTAL KNEE ARTHROPLASTY;  Surgeon: Lonni CINDERELLA Poli, MD;  Location: [facility];  Service: Orthopedics;  Laterality: Left;   TUBAL LIGATION      reports that she has never smoked. She has never been exposed to tobacco smoke. She has never used smokeless tobacco. She reports that she does not drink alcohol and does not use drugs. family history includes Cancer in an other family member; Colitis in her father; Colon cancer in her father; Colon polyps in her father; Diabetes in her daughter, father, maternal aunt, maternal grandfather, maternal grandmother, maternal uncle, mother, paternal aunt, and paternal uncle; Fibromyalgia in her sister; Healthy in her son and son; Heart disease in her father, paternal grandfather, paternal grandmother, and paternal uncle; Hypertension in her sister and another family member; Irritable bowel syndrome in her father; Schizophrenia in her maternal grandfather. Allergies  Allergen Reactions   Cyclobenzaprine  Other (See Comments)    Made the patient feel woozy   Medrol  [Methylprednisolone ] Other (See Comments)  Cannot take because of past gastric bypass surgery   Codeine Rash   Tuna [Fish Allergy ] Rash      Outpatient Encounter Medications as of 05/11/2023  Medication Sig   ALPRAZolam  (XANAX ) 1 MG tablet Take one tablet up to four times a day as needed for anxiety   diclofenac  Sodium (VOLTAREN ) 1 % GEL Apply 4 g topically 4 (four) times daily. (Patient taking differently: Apply 4 g topically See admin instructions. Apply 4 grams to painful sites four times a day)   DULoxetine  (CYMBALTA ) 20 MG capsule Take 1 capsule (20 mg total) by mouth daily.   ELIQUIS  2.5 MG TABS tablet Take 1 tablet (2.5 mg total) by mouth 2 (two) times daily. (Patient taking differently: Take  2.5 mg by mouth 2 (two) times daily.)   lisinopril  (ZESTRIL ) 10 MG tablet TAKE ONE TABLET BY MOUTH DAILY   methocarbamol  (ROBAXIN ) 500 MG tablet Take 1 tablet (500 mg total) by mouth at bedtime as needed for muscle spasms.   oxyCODONE -acetaminophen  (PERCOCET) 10-325 MG tablet TAKE ONE TABLET BY MOUTH EVERY 4 TO 6 HOURS AS NEEDED CHRONIC PAIN   pantoprazole  (PROTONIX ) 40 MG tablet Take 1 tablet (40 mg total) by mouth 2 (two) times daily.   oxyCODONE  (OXYCONTIN ) 20 mg 12 hr tablet Take 1 tablet (20 mg total) by mouth every 12 (twelve) hours. (Patient not taking: Reported on 05/11/2023)   No facility-administered encounter medications on file as of 05/11/2023.    REVIEW OF SYSTEMS  : All other systems reviewed and negative except where noted in the History of Present Illness.   PHYSICAL EXAM: BP 100/70 (BP Location: Left Arm, Patient Position: Sitting, Cuff Size: Large)   Pulse 67   Ht 5' 7 (1.702 m)   Wt 290 lb (131.5 kg)   SpO2 99%   BMI 45.42 kg/m  General: Well developed female in no acute distress Head: Normocephalic and atraumatic Eyes:  Sclerae anicteric, conjunctiva pink. Ears: Normal auditory acuity Lungs: Clear throughout to auscultation; no W/R/R. Heart: Regular rate and rhythm; no M/R/G. Abdomen: Soft, non-distended.  BS present.  Mild diffuse TTP. Musculoskeletal: Symmetrical with no gross deformities  Skin: No lesions on visible extremities Neurological: Alert oriented x 4, grossly non-focal Psychological:  Alert and cooperative. Normal mood and affect  ASSESSMENT AND PLAN: *53 year old female with complaints of generalized abdominal pain and loose stool/diarrhea as well as fecal urgency.  Colonoscopy August 2022 looked good.  Symptoms could be IBS related, but with her pain we will plan for CT scan of the abdomen and pelvis with contrast to evaluate first.  Has had gastric bypass surgery so that may be contributing to some diarrhea issues.  Does not have a gallbladder.   Could try Imodium, but questran  may also be beneficial.  Will prescribe that as one packet daily to be taken away from other medications.  Will start Bentyl  10 mg twice daily to help with the abdominal cramping.  Prescriptions sent to pharmacy.   CC:  Rosalynn Camie CROME, MD

## 2023-05-11 NOTE — Patient Instructions (Addendum)
 We have sent the following medications to your pharmacy for you to pick up at your convenience: Questran  4 g pack daily 2 hours away from other medications. Bentyl  10 mg twice daily for abdominal spasms.   You have been scheduled for a CT scan of the abdomen and pelvis at [facility], 1st floor Radiology. You are scheduled on Tuesday 05/18/23 at 2 pm. You should arrive 15 minutes prior to your appointment time for registration.    Please follow the written instructions below on the day of your exam:   1) Do not eat anything after 12 pm (4 hours prior to your test)   You may take any medications as prescribed with a small amount of water, if necessary. If you take any of the following medications: METFORMIN, GLUCOPHAGE, GLUCOVANCE, AVANDAMET, RIOMET, FORTAMET, ACTOPLUS MET, JANUMET, GLUMETZA or METAGLIP, you MAY be asked to HOLD this medication 48 hours AFTER the exam.   The purpose of you drinking the oral contrast is to aid in the visualization of your intestinal tract. The contrast solution may cause some diarrhea. Depending on your individual set of symptoms, you may also receive an intravenous injection of x-ray contrast/dye. Plan on being at [facility] for 45 minutes or longer, depending on the type of exam you are having performed.   If you have any questions regarding your exam or if you need to reschedule, you may call [facility] at [phone] between the hours of 8:00 am and 5:00 pm, Monday-Friday.

## 2023-05-12 ENCOUNTER — Other Ambulatory Visit: Payer: Self-pay | Admitting: Orthopedic Surgery

## 2023-05-12 ENCOUNTER — Encounter: Payer: Self-pay | Admitting: Gastroenterology

## 2023-05-12 ENCOUNTER — Ambulatory Visit
Admission: RE | Admit: 2023-05-12 | Discharge: 2023-05-12 | Disposition: A | Discharge status: Home / Self Care | Payer: Medicaid Other | Source: Ambulatory Visit | Attending: Orthopedic Surgery | Admitting: Orthopedic Surgery

## 2023-05-12 DIAGNOSIS — M545 Low back pain, unspecified: Secondary | ICD-10-CM

## 2023-05-12 DIAGNOSIS — R152 Fecal urgency: Secondary | ICD-10-CM | POA: Insufficient documentation

## 2023-05-12 MED ORDER — IOPAMIDOL (ISOVUE-M 200) INJECTION 41%
1.0000 mL | Freq: Once | INTRAMUSCULAR | Status: AC
  Administered 2023-05-12: 1 mL via INTRA_ARTICULAR

## 2023-05-12 MED ORDER — METHYLPREDNISOLONE ACETATE 40 MG/ML INJ SUSP (RADIOLOG
80.0000 mg | Freq: Once | INTRAMUSCULAR | Status: AC
  Administered 2023-05-12: 80 mg via INTRA_ARTICULAR

## 2023-05-14 NOTE — Progress Notes (Signed)
 Agree with assessment and plan as outlined.

## 2023-05-18 ENCOUNTER — Ambulatory Visit (HOSPITAL_COMMUNITY): Payer: Medicaid Other

## 2023-05-22 ENCOUNTER — Ambulatory Visit (HOSPITAL_COMMUNITY)
Admission: RE | Admit: 2023-05-22 | Discharge: 2023-05-22 | Discharge status: Home / Self Care | Payer: Medicaid Other | Source: Ambulatory Visit | Attending: Gastroenterology | Admitting: Gastroenterology

## 2023-05-22 DIAGNOSIS — R197 Diarrhea, unspecified: Secondary | ICD-10-CM | POA: Diagnosis present

## 2023-05-22 MED ORDER — IOHEXOL 300 MG/ML  SOLN
100.0000 mL | Freq: Once | INTRAMUSCULAR | Status: AC | PRN
  Administered 2023-05-22: 100 mL via INTRAVENOUS

## 2023-05-22 MED ORDER — IOHEXOL 300 MG/ML  SOLN
30.0000 mL | Freq: Once | INTRAMUSCULAR | Status: AC | PRN
  Administered 2023-05-22: 30 mL via ORAL

## 2023-05-24 ENCOUNTER — Encounter: Payer: Self-pay | Admitting: Family Medicine

## 2023-05-24 ENCOUNTER — Telehealth: Payer: Self-pay

## 2023-05-24 NOTE — Telephone Encounter (Signed)
Patient calls nurse line in regards to returning to work.   She reports her work note expired today, however she did not report to work.   She reports she had injections ~1.5 weeks ago and would like to be evaluated by PCP before returning back to work.   Patient scheduled with PCP for 1/22 in PM clinic.  She reports she will need a note for 1/20-1/22 to cover her until she is seen.   Advised will forward to PCP.   She reports she can access note on mychart.

## 2023-05-24 NOTE — Telephone Encounter (Signed)
Attempted to call patient and no answer.  As patient is active on [facility], a message was sent informing her to call [facility] to make appointment.  Glennie Hawk, CMA

## 2023-05-24 NOTE — Telephone Encounter (Signed)
I have placed a new letter into her MyChart. I will need to see her in office between now and February 10. Please let her know. THANKS! Denny Levy

## 2023-05-26 ENCOUNTER — Ambulatory Visit: Payer: Medicaid Other | Admitting: Family Medicine

## 2023-05-26 ENCOUNTER — Encounter: Payer: Self-pay | Admitting: Family Medicine

## 2023-05-26 VITALS — BP 122/74 | HR 102 | Ht 67.0 in | Wt 295.4 lb

## 2023-05-26 DIAGNOSIS — G8929 Other chronic pain: Secondary | ICD-10-CM

## 2023-05-26 DIAGNOSIS — M47814 Spondylosis without myelopathy or radiculopathy, thoracic region: Secondary | ICD-10-CM

## 2023-05-26 DIAGNOSIS — M5416 Radiculopathy, lumbar region: Secondary | ICD-10-CM | POA: Diagnosis present

## 2023-05-26 DIAGNOSIS — M5412 Radiculopathy, cervical region: Secondary | ICD-10-CM

## 2023-05-26 DIAGNOSIS — G894 Chronic pain syndrome: Secondary | ICD-10-CM | POA: Diagnosis not present

## 2023-05-26 DIAGNOSIS — M542 Cervicalgia: Secondary | ICD-10-CM | POA: Diagnosis not present

## 2023-05-26 DIAGNOSIS — M549 Dorsalgia, unspecified: Secondary | ICD-10-CM | POA: Diagnosis not present

## 2023-05-26 MED ORDER — OXYCODONE-ACETAMINOPHEN 10-325 MG PO TABS: ORAL_TABLET | ORAL | 0 refills | Status: DC

## 2023-05-26 NOTE — Progress Notes (Signed)
    CHIEF COMPLAINT / HPI: Follow-up radicular back pain with left-sided sciatica: Had injection which has helped some.  Probably about 50% improved.  Was considering whether or not she could return to work in the next few weeks when she reached for a gallon of milk last night and had horrible recurrence of the sciatic pain.  It has improved some overnight.  She would like to go back and see Dr. Naaman Garza, PMNR, who she has seen before for back issues. 2.  Chronic pain: Needs refills.  We had increased the dosing when her back started flaring up and she feels like she still needs the increased dose. 3.  Has appointment set up for nerve conduction studies for her carpal tunnel.   PERTINENT  PMH / PSH: I have reviewed the patient's medications, allergies, past medical and surgical history, smoking status and updated in the EMR as appropriate.   OBJECTIVE:  BP 122/74   Pulse (!) 102   Ht 5\' 7"  (1.702 m)   Wt 295 lb 6.4 oz (134 kg)   SpO2 99%   BMI 46.27 kg/m    ASSESSMENT / PLAN: Currently written out until February 10.  She will notify me on the eighth how she is doing and we will go from there regarding further plans to return to work versus further [employer].  Lumbar radiculopathy, acute Some improvement with injection therapy through [facility].  Will send referral for her to see Dr. Naaman Garza again.  Think it would be beneficial to get his input on her chronic issues.  Chronic pain Continue current pain medicine regimen.  No red flags.  Refill sent in.  Follow-up February.  Cervical radiculopathy at C6 Has some questions about getting the nerve conduction studies.  We discussed.  I think she should move forward with that.  Will help delineate whether or not she has significant contribution from median nerve compression in her wrist or if it is all coming from her neck.   Denny Levy MD

## 2023-05-26 NOTE — Assessment & Plan Note (Signed)
Has some questions about getting the nerve conduction studies.  We discussed.  I think she should move forward with that.  Will help delineate whether or not she has significant contribution from median nerve compression in her wrist or if it is all coming from her neck.

## 2023-05-26 NOTE — Assessment & Plan Note (Signed)
Continue current pain medicine regimen.  No red flags.  Refill sent in.  Follow-up February.

## 2023-05-26 NOTE — Assessment & Plan Note (Signed)
Some improvement with injection therapy through [facility].  Will send referral for her to see Dr. Naaman Plummer again.  Think it would be beneficial to get his input on her chronic issues.

## 2023-06-02 ENCOUNTER — Ambulatory Visit (INDEPENDENT_AMBULATORY_CARE_PROVIDER_SITE_OTHER): Payer: Medicaid Other | Admitting: Family Medicine

## 2023-06-02 ENCOUNTER — Encounter: Payer: Self-pay | Admitting: Family Medicine

## 2023-06-02 VITALS — BP 113/61 | HR 93 | Ht 67.0 in | Wt 294.0 lb

## 2023-06-02 DIAGNOSIS — H6991 Unspecified Eustachian tube disorder, right ear: Secondary | ICD-10-CM | POA: Diagnosis present

## 2023-06-02 DIAGNOSIS — Z6841 Body Mass Index (BMI) 40.0 and over, adult: Secondary | ICD-10-CM | POA: Diagnosis not present

## 2023-06-02 MED ORDER — SEMAGLUTIDE(0.25 OR 0.5MG/DOS) 2 MG/1.5ML ~~LOC~~ SOPN: PEN_INJECTOR | SUBCUTANEOUS | 3 refills | Status: DC

## 2023-06-02 MED ORDER — FLUTICASONE PROPIONATE 50 MCG/ACT NA SUSP: 2.0000 | Freq: Every day | NASAL | 6 refills | Status: AC

## 2023-06-04 NOTE — Progress Notes (Signed)
    CHIEF COMPLAINT / HPI: Wants to talk about obesity medications.  She is really been struggling to lose more weight.  She has had an overall lifetime weight loss of greater than 200 pounds.  Thinks the weight is really contributing to her other current issues. #2.  Having a lot of difficulty hearing out of her right ear and it feels funny.  This has been going on for about 2 weeks.  She wonders if there is wax in there.   PERTINENT  PMH / PSH: I have reviewed the patient's medications, allergies, past medical and surgical history, smoking status and updated in the EMR as appropriate.   OBJECTIVE:  BP 113/61   Pulse 93   Ht 5\' 7"  (1.702 m)   Wt 294 lb (133.4 kg)   SpO2 100%   BMI 46.05 kg/m  Vital signs reviewed. GENERAL: Well-developed, well-nourished, no acute distress. CARDIOVASCULAR: Regular rate and rhythm no murmur gallop or rub LUNGS: Clear to auscultation bilaterally, no rales or wheeze. ABDOMEN: Soft positive bowel sounds NEURO: No gross focal neurological deficits. MSK: Movement of extremity x 4. ENT: Right TM retracted and nonmobile.  No cerumen.  Neck without lymphadenopathy.  ASSESSMENT / PLAN: #1.  Obesity: We talked about medications.  Will try to see if her insurance will cover [facility].  30-minute discussion. 2.  Eustachian tube dysfunction.  Will try fluticasone nasal spray and she will let me know how symptoms are in 2 to 3 weeks.  No problem-specific Assessment & Plan notes found for this encounter.   Denny Levy MD

## 2023-06-14 ENCOUNTER — Telehealth: Payer: Self-pay | Admitting: Family Medicine

## 2023-06-14 ENCOUNTER — Encounter: Payer: Self-pay | Admitting: Family Medicine

## 2023-06-14 NOTE — Telephone Encounter (Signed)
 Patient calls requesting a letter for her to return to work. Patient says she's been messaging with Emily(Page) on MyChart about this, but I do not see any messages regarding this. Please call patient with any questions/concerns.

## 2023-06-15 NOTE — Telephone Encounter (Signed)
Dear [facility] Team I sent her a letter via MyChart yesterday. If she did not receive it please let me know THANKS! Denny Levy

## 2023-06-21 ENCOUNTER — Telehealth: Payer: Self-pay

## 2023-06-21 NOTE — Telephone Encounter (Signed)
Pharmacy Patient Advocate Encounter   Received notification from CoverMyMeds that prior authorization for [facility] is required/requested.   Insurance verification completed.   The patient is insured through [facility] MEDICAID .   Ozempic medication is used for dx of diabetes. For weight loss, please use Wegovy.  Appropriate documentation must be submitted with weight loss medication prior authorizations: weight loss attempts and lifestyle changes (including diet and exercise), and how long this was utilized. This will also need to include a statement that the patient is committed to these changes and will continue efforts.

## 2023-06-22 ENCOUNTER — Other Ambulatory Visit (HOSPITAL_COMMUNITY): Payer: Self-pay

## 2023-06-24 ENCOUNTER — Encounter: Payer: Self-pay | Admitting: Family Medicine

## 2023-06-24 ENCOUNTER — Other Ambulatory Visit (HOSPITAL_COMMUNITY): Payer: Self-pay

## 2023-06-25 ENCOUNTER — Other Ambulatory Visit: Payer: Self-pay | Admitting: Family Medicine

## 2023-06-25 MED ORDER — OXYCODONE-ACETAMINOPHEN 10-325 MG PO TABS: ORAL_TABLET | ORAL | 0 refills | Status: DC

## 2023-07-08 ENCOUNTER — Encounter (HOSPITAL_COMMUNITY): Payer: Self-pay | Admitting: *Deleted

## 2023-07-12 ENCOUNTER — Other Ambulatory Visit: Payer: Self-pay | Admitting: Family Medicine

## 2023-07-12 DIAGNOSIS — F419 Anxiety disorder, unspecified: Secondary | ICD-10-CM

## 2023-07-20 ENCOUNTER — Encounter: Payer: Self-pay | Admitting: Family Medicine

## 2023-07-20 DIAGNOSIS — M5440 Lumbago with sciatica, unspecified side: Secondary | ICD-10-CM

## 2023-07-20 NOTE — Telephone Encounter (Signed)
 Error

## 2023-07-20 NOTE — Telephone Encounter (Signed)
 Patient calls nurse line requesting a refill on Oxycodone.   She requests these go to [facility] as [facility] does not have stock.   Will forward to PCP.

## 2023-07-22 MED ORDER — OXYCODONE-ACETAMINOPHEN 10-325 MG PO TABS: ORAL_TABLET | ORAL | 0 refills | Status: DC

## 2023-08-09 ENCOUNTER — Encounter (HOSPITAL_BASED_OUTPATIENT_CLINIC_OR_DEPARTMENT_OTHER): Payer: Self-pay

## 2023-08-09 ENCOUNTER — Emergency Department (HOSPITAL_BASED_OUTPATIENT_CLINIC_OR_DEPARTMENT_OTHER): Admitting: Radiology

## 2023-08-09 ENCOUNTER — Other Ambulatory Visit: Payer: Self-pay

## 2023-08-09 DIAGNOSIS — M5412 Radiculopathy, cervical region: Secondary | ICD-10-CM | POA: Diagnosis not present

## 2023-08-09 DIAGNOSIS — Z7901 Long term (current) use of anticoagulants: Secondary | ICD-10-CM | POA: Insufficient documentation

## 2023-08-09 DIAGNOSIS — R0789 Other chest pain: Secondary | ICD-10-CM | POA: Diagnosis present

## 2023-08-09 LAB — CBC
HCT: 43.6 % (ref 36.0–46.0)
Hemoglobin: 13.3 g/dL (ref 12.0–15.0)
MCH: 25.8 pg — ABNORMAL LOW (ref 26.0–34.0)
MCHC: 30.5 g/dL (ref 30.0–36.0)
MCV: 84.7 fL (ref 80.0–100.0)
Platelets: 426 10*3/uL — ABNORMAL HIGH (ref 150–400)
RBC: 5.15 MIL/uL — ABNORMAL HIGH (ref 3.87–5.11)
RDW: 14.8 % (ref 11.5–15.5)
WBC: 12.4 10*3/uL — ABNORMAL HIGH (ref 4.0–10.5)
nRBC: 0 % (ref 0.0–0.2)

## 2023-08-09 LAB — BASIC METABOLIC PANEL WITH GFR
Anion gap: 10 (ref 5–15)
BUN: 9 mg/dL (ref 6–20)
CO2: 24 mmol/L (ref 22–32)
Calcium: 9.7 mg/dL (ref 8.9–10.3)
Chloride: 105 mmol/L (ref 98–111)
Creatinine, Ser: 0.85 mg/dL (ref 0.44–1.00)
GFR, Estimated: >60 mL/min (ref >60)
Glucose, Bld: 80 mg/dL (ref 70–99)
Potassium: 4.1 mmol/L (ref 3.5–5.1)
Sodium: 139 mmol/L (ref 135–145)

## 2023-08-09 LAB — TROPONIN I (HIGH SENSITIVITY)
Troponin I (High Sensitivity): 3 ng/L (ref <18)
Troponin I (High Sensitivity): 3 ng/L (ref <18)

## 2023-08-09 NOTE — ED Triage Notes (Signed)
 Pt states she developed chest tightness this am associated with left arm tingling +nausea

## 2023-08-10 ENCOUNTER — Emergency Department (HOSPITAL_BASED_OUTPATIENT_CLINIC_OR_DEPARTMENT_OTHER)
Admission: EM | Admit: 2023-08-10 | Discharge: 2023-08-10 | Disposition: A | Discharge status: Home / Self Care | Attending: Emergency Medicine | Admitting: Emergency Medicine

## 2023-08-10 DIAGNOSIS — M5412 Radiculopathy, cervical region: Secondary | ICD-10-CM

## 2023-08-10 NOTE — ED Provider Notes (Signed)
 [place] EMERGENCY DEPARTMENT AT [facility] Provider Note   CSN: [id number] Arrival date & time: 08/09/23  2056     History  Chief Complaint  Patient presents with   Chest Pain    Crystal Garza is a 53 y.o. female.  54 yo F w/ h/o cervical foraminal stenosis, lumbar stenosis, fibromyalgia, carpal tunnel, dvt, woke up this AM with pain in her left upper chest/shoulder area and down her left arm with some weakness in her left arm as well, decreased sensation. Has had this before with her neck issues but not this severe. No new trauma. Chest/shoulder pain with deep breathing. No fever/cough.    Chest Pain      Home Medications Prior to Admission medications   Medication Sig Start Date End Date Taking? Authorizing Provider  ALPRAZolam Prudy Feeler) 1 MG tablet Take one tablet up to four times a day as needed for anxiety 07/15/23   Nestor Ramp, MD  cholestyramine Lanetta Inch) 4 g packet Take 1 packet (4 g total) by mouth daily. 05/11/23   Zehr, Princella Pellegrini, PA-C  diclofenac Sodium (VOLTAREN) 1 % GEL Apply 4 g topically 4 (four) times daily. Patient taking differently: Apply 4 g topically See admin instructions. Apply 4 grams to painful sites four times a day 08/21/22   Littie Deeds, MD  dicyclomine (BENTYL) 10 MG capsule Take 1 capsule (10 mg total) by mouth 2 (two) times daily. 05/11/23   Zehr, Princella Pellegrini, PA-C  DULoxetine (CYMBALTA) 20 MG capsule Take 1 capsule (20 mg total) by mouth daily. 12/31/22   Nestor Ramp, MD  ELIQUIS 2.5 MG TABS tablet Take 1 tablet (2.5 mg total) by mouth 2 (two) times daily. Patient taking differently: Take 2.5 mg by mouth 2 (two) times daily. 09/30/22   Nestor Ramp, MD  fluticasone (FLONASE) 50 MCG/ACT nasal spray Place 2 sprays into both nostrils daily. 06/02/23   Nestor Ramp, MD  lisinopril (ZESTRIL) 10 MG tablet TAKE ONE TABLET BY MOUTH DAILY 08/03/22   Nestor Ramp, MD  methocarbamol (ROBAXIN) 500 MG tablet Take 1 tablet (500 mg total) by mouth at  bedtime as needed for muscle spasms. 03/29/23   Nestor Ramp, MD  oxyCODONE-acetaminophen (PERCOCET) 10-325 MG tablet TAKE ONE TABLET BY MOUTH EVERY 4 TO 6 HOURS AS NEEDED CHRONIC PAIN 07/22/23   Nestor Ramp, MD  pantoprazole (PROTONIX) 40 MG tablet Take 1 tablet (40 mg total) by mouth 2 (two) times daily. 08/14/21   Armbruster, Willaim Rayas, MD  Semaglutide,0.25 or 0.5MG /DOS, 2 MG/1.5ML SOPN 0.25 mg once weekly for 4 weeks then increase to 0.5 mg weekly for at least 4 weeks,max 1 mg 06/02/23   Nestor Ramp, MD      Allergies    Cyclobenzaprine, Medrol [methylprednisolone], Codeine, and [place] allergy]    Review of Systems   Review of Systems  Cardiovascular:  Positive for chest pain.    Physical Exam Updated Vital Signs BP 130/68 (BP Location: Right Arm)   Pulse 85   Temp (!) 97 F (36.1 C)   Resp 20   Ht 5\' 7"  (1.702 m)   Wt (!) 137.4 kg   SpO2 100%   BMI 47.46 kg/m  Physical Exam Vitals and nursing note reviewed.  Constitutional:      Appearance: She is well-developed.  HENT:     Head: Normocephalic and atraumatic.  Cardiovascular:     Rate and Rhythm: Normal rate and regular rhythm.  Pulmonary:  Effort: No respiratory distress.     Breath sounds: No stridor.  Chest:     Chest wall: Tenderness (left upper chest/shoulder) present.  Abdominal:     General: There is no distension.  Musculoskeletal:     Cervical back: Normal range of motion.     Right lower leg: No edema.     Left lower leg: No edema.     Comments: No obvious cervical ttp or deformity  Skin:    General: Skin is warm and dry.  Neurological:     General: No focal deficit present.     Mental Status: She is alert.     Comments: Mild weakness in left arm compared to right. Sensation intact. Radial pulse intact.      ED Results / Procedures / Treatments   Labs (all labs ordered are listed, but only abnormal results are displayed) Labs Reviewed  CBC - Abnormal; Notable for the following components:       Result Value   WBC 12.4 (*)    RBC 5.15 (*)    MCH 25.8 (*)    Platelets 426 (*)    All other components within normal limits  BASIC METABOLIC PANEL WITH GFR  TROPONIN I (HIGH SENSITIVITY)  TROPONIN I (HIGH SENSITIVITY)    EKG EKG Interpretation Date/Time:  Monday August 09 2023 21:04:55 EDT Ventricular Rate:  82 PR Interval:  162 QRS Duration:  72 QT Interval:  344 QTC Calculation: 401 R Axis:   24  Text Interpretation: Normal sinus rhythm Normal ECG When compared with ECG of 19-Apr-2023 10:40, No significant change was found Confirmed by Marily Memos [phone]) on 08/09/2023 11:02:17 PM  Radiology DG Chest 2 View Result Date: 08/09/2023 CLINICAL DATA:  Eighty chest pain. EXAM: CHEST - 2 VIEW COMPARISON:  02/07/2023 FINDINGS: The cardiomediastinal contours are normal. Mild eventration of right hemidiaphragm unchanged. Pulmonary vasculature is normal. No consolidation, pleural effusion, or pneumothorax. No acute osseous abnormalities are seen. IMPRESSION: No active cardiopulmonary disease. Electronically Signed   By: Narda Rutherford M.D.   On: 08/09/2023 23:10    Procedures Procedures    Medications Ordered in ED Medications - No data to display  ED Course/ Medical Decision Making/ A&P                                 Medical Decision Making Amount and/or Complexity of Data Reviewed Labs: ordered. Radiology: ordered.   Likely radicular pain from known cervical stenosis. Can't take NSAID's, prednisone 2/2 eliquis/gastric bypass. Offered lyrica/gabapentin but patient declined and prefers to follow up with her spine surgeon as scheduled this week. Ecg/troponin normal, doubt ACS. No obvious risk factors for PE.   Final Clinical Impression(s) / ED Diagnoses Final diagnoses:  Cervical radiculopathy    Rx / DC Orders ED Discharge Orders     None         Refoel Palladino, Barbara Cower, MD 08/10/23 [phone]

## 2023-08-16 ENCOUNTER — Telehealth: Payer: Self-pay

## 2023-08-16 ENCOUNTER — Encounter: Payer: Self-pay | Admitting: Family Medicine

## 2023-08-16 NOTE — Telephone Encounter (Signed)
 Patient calls nurse line in regards to a drug screen.   She reports she was screened at [employer] for a community college to plans to attend.   She reports the lab was drawn ~4/1 and recently resulted. She reports she tested positive for cocaine and oxycodone. She is completely floored by the "presence" of cocaine. She is adamant she does not do any drugs other than prescribed oxycodone.  She reports she would like Dr. Catarina Cline thoughts on this. She reports a retest at [employer] with cost her ~ 200 dollars, she is wondering if she can be re screened here.   Advised will forward to PCP for advisement.

## 2023-08-17 ENCOUNTER — Other Ambulatory Visit: Payer: Self-pay | Admitting: Family Medicine

## 2023-08-17 MED ORDER — OXYCODONE-ACETAMINOPHEN 10-325 MG PO TABS: ORAL_TABLET | ORAL | 0 refills | Status: DC

## 2023-08-17 NOTE — Telephone Encounter (Addendum)
 She is on oxycodo0ne so it is not surprising that is positive. I am not sure why the positive for cocaine. Some drug panels can be falsely positive depending on jhow they are run. Yes I can order a panel here but your employer may not accept that. I would ask the employer if they can do a more specific test on that sample.

## 2023-08-21 ENCOUNTER — Telehealth: Payer: Self-pay | Admitting: Family Medicine

## 2023-08-21 MED ORDER — OXYCODONE-ACETAMINOPHEN 10-325 MG PO TABS: ORAL_TABLET | ORAL | 0 refills | Status: DC

## 2023-08-21 NOTE — Telephone Encounter (Signed)
 After hours call  Patient called after-hours line with difficulties getting Percocet prescription.  Was sent in on 4/15 by PCP Dr. Andree Kayser to [facility].  The [facility] does not have the 180 tablets to dispense.  She is in need of sending medication to a different pharmacy.  Patient has 4 or 5 tablets left.  Given risk of withdrawal, will send prescription to [facility] on [address] ([address]) this 1 time.  Manually reviewed PDMP and last Percocet fill was 07/22/2023.    Will forward note to PCP. I have called to cancel previous [facility] pharmacy Percocet prescription.

## 2023-08-21 NOTE — Telephone Encounter (Signed)
**  After Hours/ Emergency Line Call**  Received a page to call [phone]) - [phone].  Patient: Crystal Garza   Received After hours page to call this patient but no response. Left voicemail instructing patient to call back if needed.   Edison Gore, MD [facility], PGY-2

## 2023-09-15 ENCOUNTER — Other Ambulatory Visit: Payer: Self-pay

## 2023-09-15 ENCOUNTER — Encounter: Payer: Self-pay | Admitting: Family Medicine

## 2023-09-15 MED ORDER — OXYCODONE-ACETAMINOPHEN 10-325 MG PO TABS: ORAL_TABLET | ORAL | 0 refills | Status: DC

## 2023-09-17 ENCOUNTER — Encounter (HOSPITAL_COMMUNITY): Payer: Self-pay

## 2023-09-17 ENCOUNTER — Emergency Department (HOSPITAL_COMMUNITY)

## 2023-09-17 ENCOUNTER — Emergency Department (HOSPITAL_COMMUNITY)
Admission: EM | Admit: 2023-09-17 | Discharge: 2023-09-17 | Disposition: A | Discharge status: Home / Self Care | Attending: Emergency Medicine | Admitting: Emergency Medicine

## 2023-09-17 ENCOUNTER — Other Ambulatory Visit: Payer: Self-pay

## 2023-09-17 DIAGNOSIS — Z7901 Long term (current) use of anticoagulants: Secondary | ICD-10-CM | POA: Diagnosis not present

## 2023-09-17 DIAGNOSIS — R600 Localized edema: Secondary | ICD-10-CM | POA: Diagnosis not present

## 2023-09-17 DIAGNOSIS — M7989 Other specified soft tissue disorders: Secondary | ICD-10-CM | POA: Diagnosis present

## 2023-09-17 LAB — HEPATIC FUNCTION PANEL
ALT: 15 U/L (ref 0–44)
AST: 26 U/L (ref 15–41)
Albumin: 3.5 g/dL (ref 3.5–5.0)
Alkaline Phosphatase: 86 U/L (ref 38–126)
Bilirubin, Direct: 0.2 mg/dL (ref 0.0–0.2)
Indirect Bilirubin: 0.8 mg/dL (ref 0.3–0.9)
Total Bilirubin: 1 mg/dL (ref 0.0–1.2)
Total Protein: 7.9 g/dL (ref 6.5–8.1)

## 2023-09-17 LAB — CBC WITH DIFFERENTIAL/PLATELET
Abs Immature Granulocytes: 0.03 10*3/uL (ref 0.00–0.07)
Basophils Absolute: 0.1 10*3/uL (ref 0.0–0.1)
Basophils Relative: 1 %
Eosinophils Absolute: 0.1 10*3/uL (ref 0.0–0.5)
Eosinophils Relative: 1 %
HCT: 39.1 % (ref 36.0–46.0)
Hemoglobin: 11.9 g/dL — ABNORMAL LOW (ref 12.0–15.0)
Immature Granulocytes: 0 %
Lymphocytes Relative: 36 %
Lymphs Abs: 3.3 10*3/uL (ref 0.7–4.0)
MCH: 25.8 pg — ABNORMAL LOW (ref 26.0–34.0)
MCHC: 30.4 g/dL (ref 30.0–36.0)
MCV: 84.8 fL (ref 80.0–100.0)
Monocytes Absolute: 0.6 10*3/uL (ref 0.1–1.0)
Monocytes Relative: 6 %
Neutro Abs: 5.1 10*3/uL (ref 1.7–7.7)
Neutrophils Relative %: 56 %
Platelets: 478 10*3/uL — ABNORMAL HIGH (ref 150–400)
RBC: 4.61 MIL/uL (ref 3.87–5.11)
RDW: 14.6 % (ref 11.5–15.5)
WBC: 9.1 10*3/uL (ref 4.0–10.5)
nRBC: 0 % (ref 0.0–0.2)

## 2023-09-17 LAB — BASIC METABOLIC PANEL WITH GFR
Anion gap: 8 (ref 5–15)
BUN: 10 mg/dL (ref 6–20)
CO2: 22 mmol/L (ref 22–32)
Calcium: 8.9 mg/dL (ref 8.9–10.3)
Chloride: 106 mmol/L (ref 98–111)
Creatinine, Ser: 0.9 mg/dL (ref 0.44–1.00)
GFR, Estimated: >60 mL/min (ref >60)
Glucose, Bld: 91 mg/dL (ref 70–99)
Potassium: 4 mmol/L (ref 3.5–5.1)
Sodium: 136 mmol/L (ref 135–145)

## 2023-09-17 LAB — D-DIMER, QUANTITATIVE: D-Dimer, Quant: 0.68 ug{FEU}/mL — ABNORMAL HIGH (ref 0.00–0.50)

## 2023-09-17 NOTE — ED Provider Notes (Signed)
 [place] EMERGENCY DEPARTMENT AT [facility] Provider Note   CSN: [id number] Arrival date & time: 09/17/23  0107     History  Chief Complaint  Patient presents with   Leg Swelling    OSHA BORUTA is a 53 y.o. female.  53 yo F with a chief complaints of bilateral leg swelling.  Going on for a couple days now.  Denies any difficulty breathing denies cough congestion or fever.  Denies chest pain.  Denies any recent medication changes.        Home Medications Prior to Admission medications   Medication Sig Start Date End Date Taking? Authorizing Provider  ALPRAZolam  (XANAX ) 1 MG tablet Take one tablet up to four times a day as needed for anxiety 07/15/23   Charise Companion, MD  cholestyramine  (QUESTRAN ) 4 g packet Take 1 packet (4 g total) by mouth daily. 05/11/23   Zehr, Jessica D, PA-C  diclofenac  Sodium (VOLTAREN ) 1 % GEL Apply 4 g topically 4 (four) times daily. Patient taking differently: Apply 4 g topically See admin instructions. Apply 4 grams to painful sites four times a day 08/21/22   Joelle Musca, MD  dicyclomine  (BENTYL ) 10 MG capsule Take 1 capsule (10 mg total) by mouth 2 (two) times daily. 05/11/23   Zehr, Jessica D, PA-C  DULoxetine  (CYMBALTA ) 20 MG capsule Take 1 capsule (20 mg total) by mouth daily. 12/31/22   Charise Companion, MD  ELIQUIS  2.5 MG TABS tablet Take 1 tablet (2.5 mg total) by mouth 2 (two) times daily. Patient taking differently: Take 2.5 mg by mouth 2 (two) times daily. 09/30/22   Charise Companion, MD  fluticasone  (FLONASE ) 50 MCG/ACT nasal spray Place 2 sprays into both nostrils daily. 06/02/23   Charise Companion, MD  lisinopril  (ZESTRIL ) 10 MG tablet TAKE ONE TABLET BY MOUTH DAILY 08/03/22   Charise Companion, MD  methocarbamol  (ROBAXIN ) 500 MG tablet Take 1 tablet (500 mg total) by mouth at bedtime as needed for muscle spasms. 03/29/23   Charise Companion, MD  oxyCODONE -acetaminophen  (PERCOCET) 10-325 MG tablet TAKE ONE TABLET BY MOUTH EVERY 4 TO 6 HOURS AS  NEEDED CHRONIC PAIN 09/15/23   Charise Companion, MD  pantoprazole  (PROTONIX ) 40 MG tablet Take 1 tablet (40 mg total) by mouth 2 (two) times daily. 08/14/21   Armbruster, Lendon Queen, MD  Semaglutide ,0.25 or 0.5MG /DOS, 2 MG/1.5ML SOPN 0.25 mg once weekly for 4 weeks then increase to 0.5 mg weekly for at least 4 weeks,max 1 mg 06/02/23   Charise Companion, MD      Allergies    Cyclobenzaprine , Medrol  [methylprednisolone ], Codeine, and [place] [fish allergy ]    Review of Systems   Review of Systems  Physical Exam Updated Vital Signs BP 123/78   Pulse 84   Temp 98.4 F (36.9 C) (Oral)   Resp 14   Ht 5\' 7"  (1.702 m)   Wt (!) 137.4 kg   SpO2 100%   BMI 47.46 kg/m  Physical Exam Vitals and nursing note reviewed.  Constitutional:      General: She is not in acute distress.    Appearance: She is well-developed. She is not diaphoretic.  HENT:     Head: Normocephalic and atraumatic.  Eyes:     Pupils: Pupils are equal, round, and reactive to light.  Cardiovascular:     Rate and Rhythm: Normal rate and regular rhythm.     Heart sounds: No murmur heard.    No friction  rub. No gallop.  Pulmonary:     Effort: Pulmonary effort is normal.     Breath sounds: No wheezing or rales.  Abdominal:     General: There is no distension.     Palpations: Abdomen is soft.     Tenderness: There is no abdominal tenderness.  Musculoskeletal:        General: No tenderness.     Cervical back: Normal range of motion and neck supple.     Comments: I do not appreciate any obvious edema to bilateral lower extremities.  Pulse motor and sensation intact.  Skin:    General: Skin is warm and dry.  Neurological:     Mental Status: She is alert and oriented to person, place, and time.  Psychiatric:        Behavior: Behavior normal.     ED Results / Procedures / Treatments   Labs (all labs ordered are listed, but only abnormal results are displayed) Labs Reviewed  CBC WITH DIFFERENTIAL/PLATELET - Abnormal; Notable for  the following components:      Result Value   Hemoglobin 11.9 (*)    MCH 25.8 (*)    Platelets 478 (*)    All other components within normal limits  D-DIMER, QUANTITATIVE (NOT AT [facility]) - Abnormal; Notable for the following components:   D-Dimer, Quant 0.68 (*)    All other components within normal limits  BASIC METABOLIC PANEL WITH GFR  HEPATIC FUNCTION PANEL    EKG EKG Interpretation Date/Time:  Friday Sep 17 2023 01:18:58 EDT Ventricular Rate:  93 PR Interval:  152 QRS Duration:  77 QT Interval:  349 QTC Calculation: 435 R Axis:   33  Text Interpretation: Sinus rhythm Low voltage, precordial leads No significant change since last tracing Confirmed by Albertus Hughs [phone]) on 09/17/2023 2:14:43 AM  Radiology DG Chest Port 1 View Result Date: 09/17/2023 CLINICAL DATA:  Lower extremity swelling. Shortness of breath chest pain. EXAM: PORTABLE CHEST 1 VIEW COMPARISON:  PA and lateral chest 08/09/2023 FINDINGS: Limited visualization of the lung fields due to body habitus. The heart size and mediastinal contours are within normal limits. Both lungs are grossly clear with chronically elevated right diaphragm. The visualized skeletal structures are unremarkable. IMPRESSION: Limited visualization of the lung fields due to body habitus. No obvious acute chest findings. Electronically Signed   By: Denman Fischer M.D.   On: 09/17/2023 03:15    Procedures Procedures    Medications Ordered in ED Medications - No data to display  ED Course/ Medical Decision Making/ A&P                                 Medical Decision Making Amount and/or Complexity of Data Reviewed Labs: ordered. Radiology: ordered.   53 yo F with a chief complaints of bilateral leg swelling.  Going on for a couple days.  I do not appreciate any obvious edema.  Patient is insistent that they are much more swollen than normal.  No acute renal dysfunction, LFTs are unremarkable.  Unlikely to be heart failure without  orthopnea PND and clear lung sounds.  On triage nursing had placed an order for a D-dimer.  I canceled this because I did not feel it was appropriate.  Lab ran it anyway and resulted in the computer.  It is elevated.  I think it is unlikely the patient has bilateral DVTs.  I do not feel any further workup  for this is warranted.  Will discharge home.  Supportive measures.  PCP follow-up.  6:18 AM:  I have discussed the diagnosis/risks/treatment options with the patient.  Evaluation and diagnostic testing in the emergency department does not suggest an emergent condition requiring admission or immediate intervention beyond what has been performed at this time.  They will follow up with PCP. We also discussed returning to the ED immediately if new or worsening sx occur. We discussed the sx which are most concerning (e.g., sudden worsening pain, fever, inability to tolerate by mouth) that necessitate immediate return. Medications administered to the patient during their visit and any new prescriptions provided to the patient are listed below.  Medications given during this visit Medications - No data to display   The patient appears reasonably screen and/or stabilized for discharge and I doubt any other medical condition or other [facility] requiring further screening, evaluation, or treatment in the ED at this time prior to discharge.          Final Clinical Impression(s) / ED Diagnoses Final diagnoses:  Peripheral edema    Rx / DC Orders ED Discharge Orders     None         Albertus Hughs, DO 09/17/23 [phone]

## 2023-09-17 NOTE — ED Triage Notes (Signed)
 Pt ambulatory to triage with c/o bilateral leg pain and swelling x all day. Mild pitting edema noted to ankles. Pt reports intermittent headache. Pt denies cardiac hx. Pt denies SOB and chest pain. Hx of PE.

## 2023-09-17 NOTE — Discharge Instructions (Signed)
 Your tests here look ok.  Please follow up with your family doctor in the office.   Try to elevate your legs above your heart when you are up and moving around.  Try to exercise more.  Return for difficulty breathing.

## 2023-09-20 ENCOUNTER — Telehealth: Payer: Self-pay

## 2023-09-20 NOTE — Telephone Encounter (Signed)
 Completed PA info in [place] [employer] for Oxycodone -acetaminophen  10-325 mg.  Status pending.  Will recheck status in 24 hours.  Confirmation number: [id number] Arliss Lam, RN

## 2023-09-20 NOTE — Telephone Encounter (Signed)
 Prior approval for oxycodone -acetaminophen  10-325 mg completed via [place] [employer].  Med approved for 09/20/23 - 03/18/2024  Prior approval # [id number].  [facility] pharmacy informed.    Pharmacy reports that patient was able to pick up earlier today for $4.  Elsie Halo, RN

## 2023-10-04 ENCOUNTER — Other Ambulatory Visit: Payer: Self-pay

## 2023-10-04 ENCOUNTER — Encounter (HOSPITAL_COMMUNITY): Payer: Self-pay

## 2023-10-04 ENCOUNTER — Emergency Department (HOSPITAL_COMMUNITY): Payer: PRIVATE HEALTH INSURANCE

## 2023-10-04 ENCOUNTER — Emergency Department (HOSPITAL_COMMUNITY)
Admission: EM | Admit: 2023-10-04 | Discharge: 2023-10-04 | Disposition: A | Discharge status: Home / Self Care | Payer: PRIVATE HEALTH INSURANCE | Attending: Emergency Medicine | Admitting: Emergency Medicine

## 2023-10-04 DIAGNOSIS — I10 Essential (primary) hypertension: Secondary | ICD-10-CM | POA: Diagnosis not present

## 2023-10-04 DIAGNOSIS — Z79899 Other long term (current) drug therapy: Secondary | ICD-10-CM | POA: Insufficient documentation

## 2023-10-04 DIAGNOSIS — Z7901 Long term (current) use of anticoagulants: Secondary | ICD-10-CM | POA: Diagnosis not present

## 2023-10-04 DIAGNOSIS — S7001XA Contusion of right hip, initial encounter: Secondary | ICD-10-CM | POA: Insufficient documentation

## 2023-10-04 DIAGNOSIS — S79911A Unspecified injury of right hip, initial encounter: Secondary | ICD-10-CM | POA: Diagnosis present

## 2023-10-04 DIAGNOSIS — W010XXA Fall on same level from slipping, tripping and stumbling without subsequent striking against object, initial encounter: Secondary | ICD-10-CM | POA: Diagnosis not present

## 2023-10-04 MED ORDER — ALUM & MAG HYDROXIDE-SIMETH 200-200-20 MG/5ML PO SUSP
15.0000 mL | Freq: Once | ORAL | Status: DC
  Filled 2023-10-04: qty 30

## 2023-10-04 MED ORDER — OXYCODONE HCL 5 MG PO TABS
5.0000 mg | ORAL_TABLET | Freq: Once | ORAL | Status: DC
  Filled 2023-10-04: qty 1

## 2023-10-04 NOTE — ED Notes (Signed)
 Could not find a good vein to stick

## 2023-10-04 NOTE — ED Triage Notes (Signed)
 Pt arrives via POV. Pt states she stepped on a shoe while getting out of bed last night. Pt injured her right hip. Denies hitting her head, no loc, Pt does take blood thinners. Pt AxOx4.

## 2023-10-04 NOTE — ED Provider Triage Note (Signed)
 Emergency Medicine Provider Triage Evaluation Note  Crystal Garza , a 53 y.o. female  was evaluated in triage.  Pt complains of lower back pain and right hip pain. Getting out of bed stepped on shoe and fell onto buttocks then backwards. Takes eliquis  and compliant. Ambulated following incident. No head injury, complaints prior to fall  Review of Systems  Positive: See hpi Negative: No loc, head injury  Physical Exam  BP (!) 137/91   Pulse 87   Temp 99 F (37.2 C) (Oral)   Resp 17   SpO2 100%  Gen:   Awake, no distress   Resp:  Normal effort  MSK:   Moves extremities without difficulty. TTP of cervical, thoracic, lumbar spine, and right hip Other:    Medical Decision Making  Medically screening exam initiated at 6:29 PM.  Appropriate orders placed.  Crystal Garza was informed that the remainder of the evaluation will be completed by another provider, this initial triage assessment does not replace that evaluation, and the importance of remaining in the ED until their evaluation is complete.  Labs and imaging ordered   Crystal Garza, [place] 10/04/23 1610

## 2023-10-04 NOTE — Discharge Instructions (Signed)
 Was a pleasure taking care of you today.  You are seen for pain in right hip after a fall.  Fortunately your x-rays do not show any acute fractures.  You can take your home pain medicine, use ice or heat and follow-up close with your PCP and/orthopedics.  Come back to the ER for any new or worsening symptoms.

## 2023-10-04 NOTE — ED Provider Notes (Signed)
 [facility] AT [facility] Provider Note   CSN: [id number] Arrival date & time: 10/04/23  1722     History  Chief Complaint  Patient presents with   Fall   Hip Pain    C[facility] Garza is a 53 y.o. female. She has history of obesity, hypertension, PE on chronic anticoagulation which she is compliant with.  History of gastric bypass surgery.  She presents ER today complaining of right hip pain.  States she had fallen last night, tripped when she stepped over her shoe and fell onto her right hip.  Not get dizzy, she did not pass out, no head injury.  Complaining of back and right hip pain.  She does have chronic neck and back pain for which she sees specialist.  She states when she tried to walk she noticed a lot of pain in her hip.  She is able to ambulate.  She takes chronic pain meds at home.  States she primarily came in for x-rays to make sure there is nothing broken.  She also notes she developed heartburn, feels a burning in her stomach, exactly like her usual heartburn.  Was not able to take her usual medicines due to being in the waiting room.  No chest pain, no shortness of breath, no sweating, no fever or chills, no saddle esthesia or paresthesia, no bowel or bladder incontinence, no weight loss.  No new numbness tingling or weakness.  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  Is unchanged.  Fall  Hip Pain       Home Medications Prior to Admission medications   Medication Sig Start Date End Date Taking? Authorizing Provider  ALPRAZolam  (XANAX ) 1 MG tablet Take one tablet up to four times a day as needed for anxiety 07/15/23   Charise Companion, MD  cholestyramine  (QUESTRAN ) 4 g packet Take 1 packet (4 g total) by mouth daily. 05/11/23   Zehr, Jessica D, PA-C  diclofenac  Sodium (VOLTAREN ) 1 % GEL Apply 4 g topically 4 (four) times daily. Patient taking differently: Apply 4 g topically See admin  instructions. Apply 4 grams to painful sites four times a day 08/21/22   Joelle Musca, MD  dicyclomine  (BENTYL ) 10 MG capsule Take 1 capsule (10 mg total) by mouth 2 (two) times daily. 05/11/23   Zehr, Jessica D, PA-C  DULoxetine  (CYMBALTA ) 20 MG capsule Take 1 capsule (20 mg total) by mouth daily. 12/31/22   Charise Companion, MD  ELIQUIS  2.5 MG TABS tablet Take 1 tablet (2.5 mg total) by mouth 2 (two) times daily. Patient taking differently: Take 2.5 mg by mouth 2 (two) times daily. 09/30/22   Charise Companion, MD  fluticasone  (FLONASE ) 50 MCG/ACT nasal spray Place 2 sprays into both nostrils daily. 06/02/23   Charise Companion, MD  lisinopril  (ZESTRIL ) 10 MG tablet TAKE ONE TABLET BY MOUTH DAILY 08/03/22   Charise Companion, MD  methocarbamol  (ROBAXIN ) 500 MG tablet Take 1 tablet (500 mg total) by mouth at bedtime as needed for muscle spasms. 03/29/23   Charise Companion, MD  oxyCODONE -acetaminophen  (PERCOCET) 10-325 MG tablet TAKE ONE TABLET BY MOUTH EVERY 4 TO 6 HOURS AS NEEDED CHRONIC PAIN 09/15/23   Charise Companion, MD  pantoprazole  (PROTONIX ) 40 MG tablet Take 1 tablet (40 mg total) by mouth 2 (two) times daily. 08/14/21   Armbruster, Lendon Queen, MD  Semaglutide ,0.25 or 0.5MG /DOS, 2 MG/1.5ML SOPN 0.25 mg  once weekly for 4 weeks then increase to 0.5 mg weekly for at least 4 weeks,max 1 mg 06/02/23   Charise Companion, MD      Allergies    Cyclobenzaprine , Medrol  [methylprednisolone ], Codeine, and Tuna [fish allergy ]    Review of Systems   Review of Systems  Physical Exam Updated Vital Signs BP 139/60 (BP Location: Left Arm)   Pulse 89   Temp 98.3 F (36.8 C) (Oral)   Resp 18   SpO2 98%  Physical Exam Vitals and nursing note reviewed.  Constitutional:      General: She is not in acute distress.    Appearance: She is well-developed.  HENT:     Head: Normocephalic and atraumatic.  Eyes:     Conjunctiva/sclera: Conjunctivae normal.  Cardiovascular:     Rate and Rhythm: Normal rate and regular rhythm.     Heart sounds:  No murmur heard. Pulmonary:     Effort: Pulmonary effort is normal. No respiratory distress.     Breath sounds: Normal breath sounds.  Abdominal:     Palpations: Abdomen is soft.     Tenderness: There is no abdominal tenderness.  Musculoskeletal:        General: No swelling.     Cervical back: Neck supple.     Comments: Tenderness right lateral hip.  Range of motion of right hip is painful but intact.  No tenderness of the right knee or ankle.  DP and PT pulse intact distally.  Skin:    General: Skin is warm and dry.     Capillary Refill: Capillary refill takes less than 2 seconds.  Neurological:     General: No focal deficit present.     Mental Status: She is alert and oriented to person, place, and time.  Psychiatric:        Mood and Affect: Mood normal.     ED Results / Procedures / Treatments   Labs (all labs ordered are listed, but only abnormal results are displayed) Labs Reviewed  CBC WITH DIFFERENTIAL/PLATELET  BASIC METABOLIC PANEL WITH GFR    EKG None  Radiology DG Lumbar Spine Complete Result Date: 10/04/2023 CLINICAL DATA:  Pain after trip and fall injury last night. EXAM: LUMBAR SPINE - COMPLETE 4+ VIEW COMPARISON:  03/18/2023 FINDINGS: Five lumbar type vertebral bodies. Normal alignment of the lumbar vertebrae and facet joints. No vertebral compression deformities. No focal bone lesion or bone destruction. Degenerative changes with narrowed disc spaces and endplate osteophyte formation at L4-5. No change in appearance since prior study. Visualized sacrum appears intact. IMPRESSION: Mild degenerative changes in the lumbar spine. Normal alignment. No acute displaced fractures identified. Electronically Signed   By: Boyce Byes M.D.   On: 10/04/2023 19:10   DG Thoracic Spine 2 View Result Date: 10/04/2023 CLINICAL DATA:  Pain after trip and fall injury last night. EXAM: THORACIC SPINE 2 VIEWS COMPARISON:  04/12/2018 FINDINGS: Mild thoracolumbar curvature, convex  towards the right. Mild degenerative changes with small endplate osteophyte formation throughout the thoracic spine. No anterior subluxations. No vertebral compression deformities. No focal bone lesion or bone destruction. No abnormal paraspinal soft tissue swelling. IMPRESSION: Mild degenerative changes in the thoracic spine. No acute displaced fracture identified. Electronically Signed   By: Boyce Byes M.D.   On: 10/04/2023 19:03   DG Hip Unilat  With Pelvis 2-3 Views Right Result Date: 10/04/2023 CLINICAL DATA:  Injury.  Right hip pain after injury last night. EXAM: DG HIP (WITH OR WITHOUT PELVIS) 2-3V  RIGHT COMPARISON:  07/06/2007 FINDINGS: There is no evidence of hip fracture or dislocation. There is no evidence of arthropathy or other focal bone abnormality. IMPRESSION: Negative. Electronically Signed   By: Boyce Byes M.D.   On: 10/04/2023 19:02    Procedures Procedures    Medications Ordered in ED Medications  oxyCODONE  (Oxy IR/ROXICODONE ) immediate release tablet 5 mg (has no administration in time range)  alum & mag hydroxide-simeth (MAALOX/MYLANTA) 200-200-20 MG/5ML suspension 15 mL (has no administration in time range)    ED Course/ Medical Decision Making/ A&P                                 Medical Decision Making Differential diagnosis includes but limited to fracture, sprain, strain, contusion, dislocation, HNP, other  Course: Patient here for right hip pain after fall last night.  This was a mechanical fall.  She did not have head injury.  She does have chronic neck and back pains but this is unchanged.  X-rays ordered of T-spine, L-spine and hip show no acute findings.  Will give patient Maalox as she is complaining of heartburn from waiting waiting room and not able take her home meds.  Ordered a dose of pain medicine here but she states she has not been medicine at home.  Advised to follow-up with PCP and given strict return precautions.  She is able to bear  weight on her hip, I do not feel she needs further advanced imaging at this time.  Ortho follow-up in case symptoms do not improve over the next several days.   Amount and/or Complexity of Data Reviewed Radiology: ordered.  Risk OTC drugs.           Final Clinical Impression(s) / ED Diagnoses Final diagnoses:  Contusion of right hip, initial encounter    Rx / DC Orders ED Discharge Orders     None         Joshua Nieves 10/04/23 2240    Burnette Carte, MD 10/04/23 [phone]

## 2023-10-08 ENCOUNTER — Encounter: Payer: Self-pay | Admitting: Podiatry

## 2023-10-08 ENCOUNTER — Ambulatory Visit (INDEPENDENT_AMBULATORY_CARE_PROVIDER_SITE_OTHER)

## 2023-10-08 ENCOUNTER — Ambulatory Visit: Admitting: Podiatry

## 2023-10-08 DIAGNOSIS — M21612 Bunion of left foot: Secondary | ICD-10-CM

## 2023-10-08 DIAGNOSIS — M216X2 Other acquired deformities of left foot: Secondary | ICD-10-CM | POA: Diagnosis not present

## 2023-10-08 DIAGNOSIS — D492 Neoplasm of unspecified behavior of bone, soft tissue, and skin: Secondary | ICD-10-CM | POA: Diagnosis not present

## 2023-10-08 DIAGNOSIS — M21619 Bunion of unspecified foot: Secondary | ICD-10-CM

## 2023-10-08 NOTE — Patient Instructions (Signed)
 Keep the bandage on for 24 hours. At that time, remove and clean with soap and water. If it hurts or burns before 24 hours go ahead and remove the bandage and wash with soap and water. Keep the area clean. If there is any blistering cover with antibiotic ointment and a bandage. Monitor for any redness, drainage, or other signs of infection. Call the office if any are to occur. If you have any questions, please call the office at [phone].

## 2023-10-08 NOTE — Progress Notes (Signed)
  Subjective:  Patient ID: [facility] Garza, female    DOB: Jan 27, 1971,  MRN: [id number]  Chief Complaint  Patient presents with   Foot Pain    RM#11 Left foot pain on the outside of the foot also has callus around the area.    Discussed the use of AI scribe software for clinical note transcription with the patient, who gave verbal consent to proceed.  History of Present Illness [facility] Garza is a 53 year old female who presents with painful callus formation on the foot.  She experiences persistent pain beneath the fifth metatarsal due to the callus, which does not drain or bleed. Despite scraping and applying treatments, the callus returns to its original state after two weeks. She wears the same shoes daily and uses inserts, but these measures have not alleviated the pressure or pain. Walking aggravates the condition. No injuries.       Objective:    Physical Exam General: AAO x3, NAD  Dermatological: Annular hyperkeratotic lesion of the left foot submetatarsal 5 without any underlying ulceration, drainage or any signs of infection.  There is no underlying foreign body identified or any signs of infection.  No open wounds.  Vascular: Dorsalis Pedis artery and Posterior Tibial artery pedal pulses are 2/4 bilateral with immedate capillary fill time.  There is no pain with calf compression, swelling, warmth, erythema.   Neruologic: Grossly intact via light touch bilateral.   Musculoskeletal: Prominence of metatarsal head plantarly with atrophy of the resulted in the skin lesion.     No images are attached to the encounter.    Results    Assessment:   1. Prominent metatarsal head, left   2. Skin neoplasm      Plan:  Patient was evaluated and treated and all questions answered.  Assessment and Plan Assessment & Plan Skin lesion left foot - Sharply debrided hyperkeratotic lesion without any complications or bleeding.  I cleaned the skin with alcohol.  A  pad was placed followed by salicylic acid and a bandage.  Postprocedure instructions discussed.  Monitoring signs or symptoms of  Prominent metatarsal head left -We dispensed metatarsal offloading pads.  Discussed shoes, orthotics.  Not able to remove the insert that comes out of her shoes today.  Consider custom inserts with offloading if needed.  Consider surgical intervention if needed   Charity Conch DPM

## 2023-10-13 ENCOUNTER — Ambulatory Visit (INDEPENDENT_AMBULATORY_CARE_PROVIDER_SITE_OTHER): Admitting: Family Medicine

## 2023-10-13 ENCOUNTER — Encounter: Payer: Self-pay | Admitting: Family Medicine

## 2023-10-13 VITALS — BP 128/70 | HR 77 | Wt 306.0 lb

## 2023-10-13 DIAGNOSIS — M549 Dorsalgia, unspecified: Secondary | ICD-10-CM

## 2023-10-13 DIAGNOSIS — Z7901 Long term (current) use of anticoagulants: Secondary | ICD-10-CM | POA: Diagnosis not present

## 2023-10-13 DIAGNOSIS — C49A2 Gastrointestinal stromal tumor of stomach: Secondary | ICD-10-CM

## 2023-10-13 DIAGNOSIS — I10 Essential (primary) hypertension: Secondary | ICD-10-CM

## 2023-10-13 DIAGNOSIS — M542 Cervicalgia: Secondary | ICD-10-CM

## 2023-10-13 DIAGNOSIS — G8929 Other chronic pain: Secondary | ICD-10-CM

## 2023-10-13 MED ORDER — SEMAGLUTIDE-WEIGHT MANAGEMENT 0.25 MG/0.5ML ~~LOC~~ SOAJ: 0.2500 mg | SUBCUTANEOUS | 1 refills | Status: DC

## 2023-10-13 NOTE — Patient Instructions (Signed)
 Please call for a lab only appointment. Your orders are in! Great to see you!

## 2023-10-14 ENCOUNTER — Telehealth: Payer: Self-pay

## 2023-10-14 NOTE — Assessment & Plan Note (Signed)
 Blood pressures extremely well-controlled.  Will continue current meds.  We were going to get some labs but phlebotomy not available this morning so she will come back for those.
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
## 2023-10-14 NOTE — Assessment & Plan Note (Signed)
 We reviewed associated risk of having a GIST tumor and need for lifelong anticoagulation, especially in setting of current BMI.  I explained what they were talking about with the head scan if you had trauma and that answers her questions.  Continue anticoagulation.  She has had no sign of bleeding

## 2023-10-14 NOTE — Telephone Encounter (Signed)
 Pharmacy Patient Advocate Encounter   Received notification from CoverMyMeds that prior authorization for WEGOVY  0.25MG  is required/requested.   Insurance verification completed.   The patient is insured through [facility] MEDICAID .   PA required; PA submitted to above mentioned insurance via CoverMyMeds Key/confirmation #/EOC [employer]. Status is pending

## 2023-10-14 NOTE — Assessment & Plan Note (Signed)
 Recently saw interventionalists and received back injections.  Some better.  She has follow-up with them in 3 to 4 weeks.  Will continue her chronic pain medication as there are no red flags and it also treats her other chronic issues, knee pain, neck pain etc.  Refills given.

## 2023-10-15 ENCOUNTER — Encounter: Payer: Self-pay | Admitting: Family Medicine

## 2023-10-15 ENCOUNTER — Other Ambulatory Visit: Payer: Self-pay | Admitting: Family Medicine

## 2023-10-15 DIAGNOSIS — F419 Anxiety disorder, unspecified: Secondary | ICD-10-CM

## 2023-10-15 DIAGNOSIS — Z7901 Long term (current) use of anticoagulants: Secondary | ICD-10-CM

## 2023-10-21 ENCOUNTER — Other Ambulatory Visit (HOSPITAL_COMMUNITY): Payer: Self-pay

## 2023-10-21 NOTE — Telephone Encounter (Signed)
 Refaxed PA form to Medicaid on paper application

## 2023-10-25 ENCOUNTER — Other Ambulatory Visit (HOSPITAL_COMMUNITY): Payer: Self-pay

## 2023-10-25 NOTE — Telephone Encounter (Signed)
 Pharmacy Patient Advocate Encounter  Received notification from [place] MEDICAID that Prior Authorization for WEGOVY  0.25MG  has been APPROVED from UNKNOWN to UNKNOWN

## 2023-10-27 NOTE — Telephone Encounter (Signed)
 VM left for patient advising on approval.   Pharmacy reports they do need to order the medication and reports it should be in later this week.

## 2023-11-01 MED ORDER — APIXABAN 2.5 MG PO TABS: 2.5000 mg | ORAL_TABLET | Freq: Two times a day (BID) | ORAL | 3 refills | Status: DC

## 2023-11-01 MED ORDER — ALPRAZOLAM 1 MG PO TABS: ORAL_TABLET | ORAL | 3 refills | Status: DC

## 2023-11-09 ENCOUNTER — Other Ambulatory Visit

## 2023-11-12 ENCOUNTER — Other Ambulatory Visit: Payer: Self-pay | Admitting: Family Medicine

## 2023-11-12 ENCOUNTER — Encounter: Payer: Self-pay | Admitting: Family Medicine

## 2023-11-12 DIAGNOSIS — Z7901 Long term (current) use of anticoagulants: Secondary | ICD-10-CM

## 2023-12-02 ENCOUNTER — Other Ambulatory Visit: Payer: Self-pay | Admitting: Family Medicine

## 2023-12-10 ENCOUNTER — Ambulatory Visit: Admitting: Podiatry

## 2023-12-10 VITALS — Ht 67.0 in | Wt 306.0 lb

## 2023-12-10 DIAGNOSIS — M7752 Other enthesopathy of left foot: Secondary | ICD-10-CM | POA: Diagnosis not present

## 2023-12-10 NOTE — Progress Notes (Signed)
  Subjective:  Patient ID: [facility] Garza, female    DOB: 11-09-1970,  MRN: [id number]  Chief Complaint  Patient presents with   Bunions    Rm 13 Patient is here for left foot bunion pain. Pain has been present for the last several weeks. Patient describes pain as a stabbing pain when walking or standing.    History of Present Illness [facility] Garza is a 53 year old female who presents with pain to the left foot.  She states that she tried to increase her walking to 10,000 steps a day and she has had pain pointing to the left foot, tarsal 5.  No injuries that she reports.  No other treatment other concerns.    Objective:    Physical Exam General: AAO x3, NAD  Dermatological: Hyperkeratotic lesion of the left foot submetatarsal 5 without any underlying ulceration, drainage or any signs of infection.  There is no underlying foreign body identified or any signs of infection.  No open wounds.  Vascular: Dorsalis Pedis artery and Posterior Tibial artery pedal pulses are 2/4 bilateral with immedate capillary fill time.  There is no pain with calf compression, swelling, warmth, erythema.   Neruologic: Grossly intact via light touch bilateral.   Musculoskeletal: Prominence of metatarsal head plantarly with tenderness to palpation along the area.  No other areas of pinpoint tenderness.  Results Assessment:   Capsulitis toe, left  Plan:  Patient was evaluated and treated and all questions answered.  Assessment and Plan Assessment & Plan  - We discussed both conservative as well as surgical treatment options today.  Capsulitis left  - We discussed steroid injections.  She wants to proceed with this.  Verbal consent obtained..  Mixture of 0.5 cc of Marcaine  plain, 0.5 cc of dexamethasone  phosphate was infiltrated into the area of maximal tenderness submetatarsal 5 without complications.  Postinjection care discussed.  Tolerated well.  Skin lesion left foot - Courtesy  debrided the any complications or bleeding.  Offloading pad dispensed.   C[facility] Garza DPM

## 2023-12-10 NOTE — Patient Instructions (Signed)

## 2023-12-13 MED ORDER — OXYCODONE-ACETAMINOPHEN 10-325 MG PO TABS
ORAL_TABLET | ORAL | 0 refills | Status: DC
Start: 2023-12-13 — End: 2024-01-06

## 2023-12-27 ENCOUNTER — Other Ambulatory Visit: Payer: Self-pay | Admitting: Family Medicine

## 2024-01-06 NOTE — Addendum Note (Signed)
 Addended by: GLENDIA DAMIEN SQUIBB on: 01/06/2024 02:51 PM   Modules accepted: Orders

## 2024-01-07 MED ORDER — APIXABAN 2.5 MG PO TABS: 2.5000 mg | ORAL_TABLET | Freq: Two times a day (BID) | ORAL | 3 refills | Status: AC

## 2024-01-07 MED ORDER — OXYCODONE-ACETAMINOPHEN 10-325 MG PO TABS: ORAL_TABLET | ORAL | 0 refills | Status: DC

## 2024-01-10 ENCOUNTER — Ambulatory Visit (INDEPENDENT_AMBULATORY_CARE_PROVIDER_SITE_OTHER): Admitting: Podiatry

## 2024-01-10 ENCOUNTER — Encounter: Payer: Self-pay | Admitting: Podiatry

## 2024-01-10 DIAGNOSIS — D492 Neoplasm of unspecified behavior of bone, soft tissue, and skin: Secondary | ICD-10-CM

## 2024-01-10 NOTE — Progress Notes (Signed)
 Subjective:   Patient ID: [facility] Garza, female   DOB: 53 y.o.   MRN: [id number]   HPI Patient presents with a painful lesion underneath the left fifth metatarsal that is hard to walk on.  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  Patient will be seen back in approximately month and if symptoms have not improved we will need to consider fifth metatarsal head resection at this time

## 2024-01-26 ENCOUNTER — Telehealth: Payer: Self-pay

## 2024-01-26 ENCOUNTER — Other Ambulatory Visit (HOSPITAL_COMMUNITY): Payer: Self-pay

## 2024-01-26 NOTE — Telephone Encounter (Signed)
 Pharmacy Patient Advocate Encounter  Rec'd PA request for Wegovy  0.25mg   Effective October 1st, Medicaid will discontinue coverage of GLP1 medications for weight loss (such as Wegovy  and Zepbound), unless the patient has a documented history of a heart attack or stroke. Zepbound will continue to be covered only for patients with moderate to severe sleep apnea (AHI 15-30). Because of this change, the prior authorization team will not be submitting new PA requests for GLP1 medications prescribed for weight loss between now and October 1st, as patients will be unable to continue therapy under Medicaid coverage.

## 2024-02-02 ENCOUNTER — Encounter: Payer: Self-pay | Admitting: Family Medicine

## 2024-02-03 ENCOUNTER — Other Ambulatory Visit: Payer: Self-pay

## 2024-02-03 MED ORDER — OXYCODONE-ACETAMINOPHEN 10-325 MG PO TABS: ORAL_TABLET | ORAL | 0 refills | Status: DC

## 2024-02-07 ENCOUNTER — Ambulatory Visit: Admitting: Podiatry

## 2024-02-07 DIAGNOSIS — D492 Neoplasm of unspecified behavior of bone, soft tissue, and skin: Secondary | ICD-10-CM

## 2024-02-07 NOTE — Progress Notes (Signed)
  Subjective:  Patient ID: [facility] Garza, female    DOB: Feb 01, 1971,  MRN: [id number]  Chief Complaint  Patient presents with   Foot Pain    Pt stated that place on her left foot is still causing her some discomfort      History of Present Illness [facility] Garza is a 53 year old female who presents with pain to the left foot.  She states that when she wears her flip-flops and seems to feel better at this has more cushion.  After her last appointment she said that she had increased pain for about a week but no open lesions or any drainage.  No injuries.  No other concerns.   Objective:    Physical Exam General: AAO x3, NAD  Dermatological: Hyperkeratotic lesion of the left foot submetatarsal 5 without any underlying ulceration, drainage or any signs of infection.  There is a small Mehta pinpoint bleeding and concern for malodor.  Does appear to be slightly different compared to what it was previously.  No hyperpigmentation.  Vascular: Dorsalis Pedis artery and Posterior Tibial artery pedal pulses are 2/4 bilateral with immedate capillary fill time.  There is no pain with calf compression, swelling, warmth, erythema.   Neruologic: Grossly intact via light touch bilateral.   Musculoskeletal: Prominence of metatarsal head plantarly with tenderness to palpation along the area.  No other areas of pinpoint tenderness.  Results Assessment:   Capsulitis toe, left  Plan:  Patient was evaluated and treated and all questions answered.  Assessment and Plan Assessment & Plan  - We discussed both conservative as well as surgical treatment options today. She wants to hold off on surgery at this time.   Skin lesion left foot - Sharply debrided the any complications or bleeding.  Cleaned skin with alcohol.  Salicylic acid was applied followed by bandage.  Postprocedure instructions discussed.  Monitor for any signs or symptoms of infection.  Continue offloading.   - She is going  to bring in her inserts next appointment to help with offloading.    [name] DPM

## 2024-02-07 NOTE — Patient Instructions (Signed)
 Keep the bandage on for 24 hours. At that time, remove and clean with soap and water. If it hurts or burns before 24 hours go ahead and remove the bandage and wash with soap and water. Keep the area clean. If there is any blistering cover with antibiotic ointment and a bandage. Monitor for any redness, drainage, or other signs of infection. Call the office if any are to occur. If you have any questions, please call the office at [phone].

## 2024-02-08 ENCOUNTER — Telehealth: Payer: Self-pay | Admitting: Gastroenterology

## 2024-02-11 ENCOUNTER — Ambulatory Visit: Admitting: Podiatry

## 2024-02-23 ENCOUNTER — Other Ambulatory Visit: Payer: Self-pay | Admitting: Family Medicine

## 2024-02-23 DIAGNOSIS — F419 Anxiety disorder, unspecified: Secondary | ICD-10-CM

## 2024-03-01 ENCOUNTER — Ambulatory Visit: Payer: PRIVATE HEALTH INSURANCE | Admitting: Family Medicine

## 2024-03-01 ENCOUNTER — Encounter: Payer: Self-pay | Admitting: Family Medicine

## 2024-03-01 VITALS — BP 133/69 | HR 84 | Ht 67.0 in | Wt 315.6 lb

## 2024-03-01 DIAGNOSIS — C49A2 Gastrointestinal stromal tumor of stomach: Secondary | ICD-10-CM | POA: Diagnosis not present

## 2024-03-01 DIAGNOSIS — M5440 Lumbago with sciatica, unspecified side: Secondary | ICD-10-CM

## 2024-03-01 DIAGNOSIS — Z23 Encounter for immunization: Secondary | ICD-10-CM

## 2024-03-01 DIAGNOSIS — I10 Essential (primary) hypertension: Secondary | ICD-10-CM | POA: Diagnosis not present

## 2024-03-01 DIAGNOSIS — Z7901 Long term (current) use of anticoagulants: Secondary | ICD-10-CM

## 2024-03-01 DIAGNOSIS — M25561 Pain in right knee: Secondary | ICD-10-CM

## 2024-03-01 MED ORDER — METHOCARBAMOL 500 MG PO TABS: 500.0000 mg | ORAL_TABLET | Freq: Every evening | ORAL | 1 refills | Status: AC | PRN

## 2024-03-01 MED ORDER — ONDANSETRON 4 MG PO TBDP: 4.0000 mg | ORAL_TABLET | Freq: Three times a day (TID) | ORAL | 3 refills | Status: AC | PRN

## 2024-03-01 MED ORDER — LISINOPRIL 10 MG PO TABS: 10.0000 mg | ORAL_TABLET | Freq: Every day | ORAL | 3 refills | Status: AC

## 2024-03-01 MED ORDER — OXYCODONE-ACETAMINOPHEN 10-325 MG PO TABS: ORAL_TABLET | ORAL | 0 refills | Status: DC

## 2024-03-01 MED ORDER — DULOXETINE HCL 20 MG PO CPEP: 20.0000 mg | ORAL_CAPSULE | Freq: Every day | ORAL | 3 refills | Status: AC

## 2024-03-01 NOTE — Patient Instructions (Signed)
 Once I see your x-ray, I will send you a note through MyChart.  I will also send you some lab results the same way.  I have sent in the refills we talked about.  Great to see you!
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
## 2024-03-02 LAB — LIPID PANEL
Chol/HDL Ratio: 2.9 ratio (ref 0.0–4.4)
Cholesterol, Total: 161 mg/dL (ref 100–199)
HDL: 55 mg/dL (ref >39)
LDL Chol Calc (NIH): 92 mg/dL (ref 0–99)
Triglycerides: 74 mg/dL (ref 0–149)
VLDL Cholesterol Cal: 14 mg/dL (ref 5–40)

## 2024-03-02 LAB — COMPREHENSIVE METABOLIC PANEL WITH GFR
ALT: 11 IU/L (ref 0–32)
AST: 17 IU/L (ref 0–40)
Albumin: 3.8 g/dL (ref 3.8–4.9)
Alkaline Phosphatase: 103 IU/L (ref 49–135)
BUN/Creatinine Ratio: 11 (ref 9–23)
BUN: 8 mg/dL (ref 6–24)
Bilirubin Total: 0.4 mg/dL (ref 0.0–1.2)
CO2: 23 mmol/L (ref 20–29)
Calcium: 9.3 mg/dL (ref 8.7–10.2)
Chloride: 104 mmol/L (ref 96–106)
Creatinine, Ser: 0.76 mg/dL (ref 0.57–1.00)
Globulin, Total: 3 g/dL (ref 1.5–4.5)
Glucose: 72 mg/dL (ref 70–99)
Potassium: 4.1 mmol/L (ref 3.5–5.2)
Sodium: 141 mmol/L (ref 134–144)
Total Protein: 6.8 g/dL (ref 6.0–8.5)
eGFR: 92 mL/min/1.73 (ref >59)

## 2024-03-02 LAB — CBC
Hematocrit: 37.7 % (ref 34.0–46.6)
Hemoglobin: 11.7 g/dL (ref 11.1–15.9)
MCH: 25.1 pg — ABNORMAL LOW (ref 26.6–33.0)
MCHC: 31 g/dL — ABNORMAL LOW (ref 31.5–35.7)
MCV: 81 fL (ref 79–97)
Platelets: 463 x10E3/uL — ABNORMAL HIGH (ref 150–450)
RBC: 4.66 x10E6/uL (ref 3.77–5.28)
RDW: 14.2 % (ref 11.7–15.4)
WBC: 9.7 x10E3/uL (ref 3.4–10.8)

## 2024-03-06 ENCOUNTER — Ambulatory Visit
Admission: RE | Admit: 2024-03-06 | Discharge: 2024-03-06 | Disposition: A | Source: Ambulatory Visit | Attending: Family Medicine | Admitting: Family Medicine

## 2024-03-06 ENCOUNTER — Telehealth: Payer: Self-pay

## 2024-03-06 ENCOUNTER — Other Ambulatory Visit (HOSPITAL_COMMUNITY): Payer: Self-pay

## 2024-03-06 ENCOUNTER — Encounter: Payer: Self-pay | Admitting: Family Medicine

## 2024-03-06 DIAGNOSIS — M25561 Pain in right knee: Secondary | ICD-10-CM

## 2024-03-06 NOTE — Telephone Encounter (Signed)
 Pharmacy Patient Advocate Encounter   Received notification from Patient Advice Request messages that prior authorization for OXY/APAP is required/requested.   Insurance verification completed.   The patient is insured through [facility] MEDICAID.   PA required; PA submitted to above mentioned insurance via [employer]/confirmation #/[id number] W. Status is pending

## 2024-03-08 ENCOUNTER — Other Ambulatory Visit: Payer: Self-pay | Admitting: Medical Genetics

## 2024-03-08 ENCOUNTER — Ambulatory Visit: Payer: Self-pay | Admitting: Family Medicine

## 2024-03-08 NOTE — Progress Notes (Signed)
 Offered her appt at [facility] this friday

## 2024-03-09 NOTE — Telephone Encounter (Signed)
 Pharmacy Patient Advocate Encounter  Received notification from [place] [employer]ICA[employer] that Prior Authorization for OXY/APAP has been APPROVED from 03/06/24 to 06/04/24

## 2024-03-31 ENCOUNTER — Encounter: Payer: Self-pay | Admitting: Family Medicine

## 2024-04-03 MED ORDER — OXYCODONE-ACETAMINOPHEN 10-325 MG PO TABS: ORAL_TABLET | ORAL | 0 refills | Status: DC

## 2024-04-06 ENCOUNTER — Encounter: Payer: Self-pay | Admitting: Family Medicine

## 2024-04-19 ENCOUNTER — Encounter: Payer: Self-pay | Admitting: Family Medicine

## 2024-04-19 ENCOUNTER — Ambulatory Visit: Admitting: Family Medicine

## 2024-04-19 VITALS — BP 132/72 | HR 96 | Wt 313.0 lb

## 2024-04-19 DIAGNOSIS — M5030 Other cervical disc degeneration, unspecified cervical region: Secondary | ICD-10-CM | POA: Diagnosis present

## 2024-04-19 DIAGNOSIS — T7800XA Anaphylactic reaction due to unspecified food, initial encounter: Secondary | ICD-10-CM

## 2024-04-19 DIAGNOSIS — G894 Chronic pain syndrome: Secondary | ICD-10-CM

## 2024-04-19 DIAGNOSIS — M1712 Unilateral primary osteoarthritis, left knee: Secondary | ICD-10-CM

## 2024-04-19 MED ORDER — EPINEPHRINE 0.3 MG/0.3ML IJ SOAJ: 0.3000 mg | Freq: Once | INTRAMUSCULAR | 1 refills | Status: AC | PRN

## 2024-04-19 NOTE — Patient Instructions (Signed)
 Let me see you in a month or two. I sent in the referral for the allergist and if you have not heard from them in 2 weeks.

## 2024-04-19 NOTE — Progress Notes (Signed)
° °  Discussed the use of AI scribe software for clinical note transcription with the patient, who gave verbal consent to proceed.  History of Present Illness   Crystal Garza is a 53 year old female who presents with a suspected allergic reaction to candy and other sugary foods.  Suspected food-induced allergic reactions - Severe reaction approximately two weeks ago after consuming candy; characterized by persistent throat discomfort and abnormal throat sensations - Received Benadryl  25 mg with partial relief; symptoms persisted into the following day - Subsequent similar reactions after eating peach rings and drinking Kool-Aid, with throat discomfort and sensation of slow digestion - Concern for possible additional food allergy  as she has had similar problems previously with tuna.  Chronic pain syndromes - Fibromyalgia with chronic neck and back pain - Carpal tunnel syndrome - Prior MRIs and nerve conduction studies performed - Neck pain perceived as inadequately addressed by her current ortho/pain mgmt physician - Back pain managed by ortho/pain mgmt; epidural steroid injection being arranged  Hematologic findings - History of elevated platelet counts, ongoing monitoring by hem onc       PERTINENT  PMH / PSH: I have reviewed the patients medications, allergies, past medical and surgical history, smoking status.  Pertinent findings that relate to today's visit / issues include:   Physical Exam     Vital signs reviewed. GENERAL: Well-developed, well-nourished, no acute distress. CARDIOVASCULAR: Regular rate and rhythm no murmur gallop or rub LUNGS: Clear to auscultation bilaterally, no rales or wheeze. ABDOMEN: Soft positive bowel sounds NEURO: No gross focal neurological deficits. MSK: Movement of extremity x 4.         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 No EpiPen  available. - Prescribed EpiPen , instructed  to carry at all times. - Referred to allergist for further evaluation. Previously was Dr Orlan Cramp and wil lrefer back there  Chronic back and neck pain with radiculopathy - Proceed with epidural steroid injection for back pain pending insurance approval. - Follow up with Dr. Cyrena for further management. - Consider neurosurgery referral if current management is inadequate.     Chronic Knee DJD pain Continue current medication regimen

## 2024-05-01 ENCOUNTER — Other Ambulatory Visit: Payer: Self-pay

## 2024-05-01 ENCOUNTER — Encounter: Payer: Self-pay | Admitting: Family Medicine

## 2024-05-01 MED ORDER — OXYCODONE-ACETAMINOPHEN 10-325 MG PO TABS: ORAL_TABLET | ORAL | 0 refills | Status: DC

## 2024-05-07 NOTE — Progress Notes (Unsigned)
 "  New Patient Note  RE: Crystal Garza MRN: [id number] DOB: 09/30/1970 Date of Office Visit: 05/08/2024  Consult requested by: Rosalynn Camie CROME, MD Primary care provider: Rosalynn Camie CROME, MD  Chief Complaint: No chief complaint on file.  History of Present Illness: I had the pleasure of seeing Crystal Garza for initial evaluation at the [facility] on 05/08/2024. She is a 54 y.o. female, who is referred here by Rosalynn Camie CROME, MD for the evaluation of food allergies. Last seen in our office in 2022 for pruritus, adverse food reactions.   Discussed the use of AI scribe software for clinical note transcription with the patient, who gave verbal consent to proceed.  History of Present Illness         She reports food allergy  to ***. The reaction occurred at the age of ***, after she ate *** amount of ***. Symptoms started within *** and was in the form of *** hives, swelling, wheezing, abdominal pain, diarrhea, vomiting. ***Denies any associated cofactors such as exertion, infection, NSAID use, or alcohol consumption. The symptoms lasted for ***. She was evaluated in ED and received ***. Since this episode, she does *** not report other accidental exposures to ***. She does *** not have access to epinephrine  autoinjector and *** needed to use it.   Past work up includes: ***. Dietary History: patient has been eating other foods including ***milk, ***eggs, ***peanut, ***treenuts, ***sesame, ***shellfish, ***fish, ***soy, ***wheat, ***meats, ***fruits and ***vegetables.  She reports reading labels and avoiding *** in diet completely. She tolerates ***baked egg and baked milk products.     04/19/2024 PCP visit: Suspected food-induced allergic reactions - Severe reaction approximately two weeks ago after consuming candy; characterized by persistent throat discomfort and abnormal throat sensations - Received Benadryl  25 mg with partial relief; symptoms persisted into the  following day - Subsequent similar reactions after eating peach rings and drinking Kool-Aid, with throat discomfort and sensation of slow digestion - Concern for possible additional food allergy  as she has had similar problems previously with tuna.  Pruritus Past history - Breaking out in rash for the past 4 years about 2 times per week. No change since onset. Outbreaks last 1-2 days. No specific triggers noted but concerned about developing new allergies. 2018 intradermal testing positive to grass, ragweed, weed, trees, mold, cat; 2018 skin prick testing negative to select foods. 2018 bloodwork - negative to seafood panel, CU index, tryptase, ANA, ESR, coffee. Elevated crp. 2022 skin testing showed: Negative to environmental allergy  panel, food panel. 2022 bloodwork - CBC diff, CMP, crp, TSH, ANA, tryptase, alpha gal were all normal. ESR and CU elevated. Interim history - still has itching but only taking zyrtec  and famotidine  once a day. Reviewed bloodwork and ESR still elevated. Has joint pains and saw rheumatology in the past.  Her symptoms are most likely not triggered by any allergies but she does have elevated CU which means her body is producing more histamine than normal. This can definitely contribute to her itching/rash. Alcohol, getting overheated, pain medications can also worsen these symptoms.  Discussed with patient that ESR is a nonspecific inflammatory marker. Will send results to PCP.  Refer to rheumatology for joint pain and elevated SED rate. Switch to allegra  180mg  twice a day instead of zyrtec .  Samples given.  If rash/itching is not controlled or causes drowsiness let us  know. START famotidine  20mg  twice a day. Continue proper skin care - moisturize daily.  Take pictures  of the rash. Keep track of episodes.   Other adverse food reactions, not elsewhere classified, subsequent encounter Past history - Currently avoiding tuna and declined in office food challenge in the  past - had negative skin prick testing and bloodwork. Noticed shortness of breath x 2 after eating pineapple recently. Resolved within minutes. Some fresh fruits cause some itching. 2022 skin prick testing was negative to foods. Continue to avoid foods that are bothersome - tuna.  Assessment and Plan: Crystal Garza is a 54 y.o. female with: ***  Assessment and Plan               No follow-ups on file.  No orders of the defined types were placed in this encounter.  Lab Orders  No laboratory test(s) ordered today    Other allergy  screening: Asthma: {Blank single:19197::yes,no} Rhino conjunctivitis: {Blank single:19197::yes,no} Food allergy : {Blank single:19197::yes,no} Medication allergy : {Blank single:19197::yes,no} Hymenoptera allergy : {Blank single:19197::yes,no} Urticaria: {Blank single:19197::yes,no} Eczema:{Blank single:19197::yes,no} History of recurrent infections suggestive of immunodeficency: {Blank single:19197::yes,no}  Diagnostics: Spirometry:  Tracings reviewed. Her effort: {Blank single:19197::Good reproducible efforts.,It was hard to get consistent efforts and there is a question as to whether this reflects a maximal maneuver.,Poor effort, data can not be interpreted.} FVC: ***L FEV1: ***L, ***% predicted FEV1/FVC ratio: ***% Interpretation: {Blank single:19197::Spirometry consistent with mild obstructive disease,Spirometry consistent with moderate obstructive disease,Spirometry consistent with severe obstructive disease,Spirometry consistent with possible restrictive disease,Spirometry consistent with mixed obstructive and restrictive disease,Spirometry uninterpretable due to technique,Spirometry consistent with normal pattern,No overt abnormalities noted given today's efforts}.  Please see scanned spirometry results for details.  Skin Testing: {Blank single:19197::Select foods,Environmental allergy   panel,Environmental allergy  panel and select foods,Food allergy  panel,None,Deferred due to recent antihistamines use}. *** Results discussed with patient/family.   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   History of pulmonary embolism 05/25/2017   Esophageal dysphagia    Family history of colon cancer    Macromastia 02/27/2015   Chronic neck and back pain 02/27/2015   Chronic anticoagulation 05/17/2014   Degenerative arthritis of left knee 10/06/2013   Status post total knee replacement 10/06/2013   Chronic pain 06/13/2013   Lap Sleeve Gastrectomy May 2014 09/16/2012   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) 02/17/2012   Generalized abdominal pain 06/02/2011   Diarrhea 05/11/2011   Nephrolithiasis 02/20/2011   Other mixed anxiety disorders 06/27/2010   Migraine 09/12/2009   BMI 45.0-49.9, adult (HCC) 10/30/2008   Major depressive disorder, recurrent episode 07/01/2006   HYPERTENSION, BENIGN SYSTEMIC 07/01/2006   Gastroesophageal reflux disease 07/01/2006   Past Medical History:  Diagnosis Date   Anemia    iron def   Anginal pain    Anxiety    Arthritis    left knee    Back pain    Benign gastrointestinal stromal tumor (GIST)    Chronic knee pain    Depression    DVT (deep venous thrombosis) (HCC)    Esophageal dysmotility  GERD (gastroesophageal reflux disease)    Hiatal  hernia    History of kidney stones    Hx of laparoscopic gastric banding    Hypertension    Internal hemorrhoids    Internal hemorrhoids    Migraine    Morbid obesity (HCC)    PE (pulmonary embolism)    Past Surgical History: Past Surgical History:  Procedure Laterality Date   61 HOUR PH STUDY N/A 05/13/2016   Procedure: 24 HOUR PH STUDY;  Surgeon: Elspeth Deward Naval, MD;  Location: [facility];  Service: Gastroenterology;  Laterality: N/A;   BIOPSY  02/16/2012   Procedure: BIOPSY;  Surgeon: Donnice KATHEE Lunger, MD;  Location: [facility];  Service: General;;  biopsy of mass x 2   BREATH TEK H PYLORI  07/28/2011   Procedure: BREATH TEK H PYLORI;  Surgeon: Donnice KATHEE Lunger, MD;  Location: [facility];  Service: General;  Laterality: N/A;  to be done at 745   CESAREAN SECTION  05/1991   COLONOSCOPY WITH PROPOFOL  N/A 03/03/2016   Procedure: COLONOSCOPY WITH PROPOFOL ;  Surgeon: Elspeth Deward Naval, MD;  Location: [facility];  Service: Gastroenterology;  Laterality: N/A;   ESOPHAGEAL MANOMETRY N/A 05/13/2016   Procedure: ESOPHAGEAL MANOMETRY (EM);  Surgeon: Elspeth Deward Naval, MD;  Location: [facility];  Service: Gastroenterology;  Laterality: N/A;   ESOPHAGOGASTRODUODENOSCOPY N/A 08/29/2012   Procedure: ESOPHAGOGASTRODUODENOSCOPY (EGD);  Surgeon: Donnice KATHEE Lunger, MD;  Location: [facility];  Service: General;  Laterality: N/A;   ESOPHAGOGASTRODUODENOSCOPY N/A 09/04/2014   Procedure: ESOPHAGOGASTRODUODENOSCOPY (EGD);  Surgeon: Alm Angle, MD;  Location: [facility];  Service: General;  Laterality: N/A;   ESOPHAGOGASTRODUODENOSCOPY (EGD) WITH PROPOFOL  N/A 03/03/2016   Procedure: ESOPHAGOGASTRODUODENOSCOPY (EGD) WITH PROPOFOL ;  Surgeon: Elspeth Deward Naval, MD;  Location: [facility];  Service: Gastroenterology;  Laterality: N/A;   EUS  03/03/2012   Procedure: UPPER ENDOSCOPIC ULTRASOUND (EUS) LINEAR;  Surgeon: Toribio SHAUNNA Cedar, MD;  Location: [facility];  Service: Endoscopy;   Laterality: N/A;   GASTRIC ROUX-EN-Y N/A 12/07/2017   Procedure: LAPAROSCOPIC ROUX-EN-Y GASTRIC BYPASS WITH LYSIS OF ADHESIONS AND UPPER ENDOSCOPY ERAS Pathway;  Surgeon: Lunger Donnice, MD;  Location: [facility];  Service: General;  Laterality: N/A;   GIST TUMOR STOMACH N/A    KNEE ARTHROSCOPY     LAPAROSCOPIC GASTRECTOMY  04/08/2012   Procedure: LAPAROSCOPIC GASTRECTOMY;  Surgeon: Donnice KATHEE Lunger, MD;  Location: [facility];  Service: General;;  removal of GIST tumor of stomach   LAPAROSCOPIC GASTRIC SLEEVE RESECTION N/A 08/29/2012   Procedure: LAPAROSCOPIC GASTRIC SLEEVE RESECTION;  Surgeon: Donnice KATHEE Lunger, MD;  Location: [facility];  Service: General;  Laterality: N/A;  Sleeve Gastrectomy   LEFT HEART CATH AND CORONARY ANGIOGRAPHY N/A 05/28/2017   Procedure: LEFT HEART CATH AND CORONARY ANGIOGRAPHY;  Surgeon: Anner Alm ORN, MD;  [facility];  Service: Cardiovascular;  Laterality: N/A;   PANNICULECTOMY N/A 06/09/2021   Procedure: PANNICULECTOMY;  Surgeon: Lunger Donnice, MD;  Location: [facility];  Service: General;  Laterality: N/A;   ROUX-EN-Y GASTRIC BYPASS     TOTAL ABDOMINAL HYSTERECTOMY  04/2004   TOTAL KNEE ARTHROPLASTY Left 10/06/2013   Procedure: LEFT TOTAL KNEE ARTHROPLASTY;  Surgeon: Lonni CINDERELLA Poli, MD;  Location: [facility];  Service: Orthopedics;  Laterality: Left;   TUBAL LIGATION     Medication List:  Current Outpatient Medications  Medication Sig Dispense Refill   ALPRAZolam  (XANAX ) 1 MG tablet Take one tablet up to four times a day as needed for anxiety 120 tablet  3   apixaban  (ELIQUIS ) 2.5 MG TABS tablet Take 1 tablet (2.5 mg total) by mouth 2 (two) times daily. 180 tablet 3   cholestyramine  (QUESTRAN ) 4 g packet Take 1 packet (4 g total) by mouth daily. 30 packet 3   diclofenac  Sodium (VOLTAREN ) 1 % GEL Apply 4 g topically 4 (four) times daily. (Patient taking differently: Apply 4 g topically See admin instructions. Apply 4 grams to painful sites four times  a day) 100 g    dicyclomine  (BENTYL ) 10 MG capsule Take 1 capsule (10 mg total) by mouth 2 (two) times daily. 60 capsule 3   DULoxetine  (CYMBALTA ) 20 MG capsule Take 1 capsule (20 mg total) by mouth daily. 90 capsule 3   EPINEPHrine  0.3 mg/0.3 mL IJ SOAJ injection Inject 0.3 mg into the muscle once as needed (anaphylaxis/allergic reaction). 2 each 1   fluticasone  (FLONASE ) 50 MCG/ACT nasal spray Place 2 sprays into both nostrils daily. 16 g 6   lisinopril  (ZESTRIL ) 10 MG tablet Take 1 tablet (10 mg total) by mouth daily. 90 tablet 3   methocarbamol  (ROBAXIN ) 500 MG tablet Take 1 tablet (500 mg total) by mouth at bedtime as needed for muscle spasms. 30 tablet 1   ondansetron  (ZOFRAN -ODT) 4 MG disintegrating tablet Take 1 tablet (4 mg total) by mouth every 8 (eight) hours as needed for nausea or vomiting. 60 tablet 3   oxyCODONE -acetaminophen  (PERCOCET) 10-325 MG tablet TAKE ONE TABLET BY MOUTH EVERY 4 TO 6 HOURS AS NEEDED CHRONIC PAIN 180 tablet 0   pantoprazole  (PROTONIX ) 40 MG tablet Take 1 tablet (40 mg total) by mouth 2 (two) times daily. 60 tablet 2   WEGOVY  0.25 MG/0.5ML SOAJ SQ injection Inject 0.25 mg into the skin once a week. 2 mL 1   No current facility-administered medications for this visit.   Allergies: Allergies[1] Social History: Social History   Socioeconomic History   Marital status: Married    Spouse name: Not on file   Number of children: 3   Years of education: 12   Highest education level: Not on file  Occupational History   Occupation: CNA , does Home Health  Tobacco Use   Smoking status: Never    Passive exposure: Never   Smokeless tobacco: Never  Vaping Use   Vaping status: Never Used  Substance and Sexual Activity   Alcohol use: No    Alcohol/week: 0.0 standard drinks of alcohol   Drug use: No   Sexual activity: Yes    Birth control/protection: Surgical  Other Topics Concern   Not on file  Social History Narrative   Patient has been on disability  since 2004 for knee pain, back pain, depression and acid reflux. She does work part-time now as LAWYER.      She is caring for her granddaughter Amia age 55 (has sickle cell, goes to [facility] every month and has had multiple hospitalizations).       Patient enjoys going on walks, going to the beach, and cooking.    Social Drivers of Health   Tobacco Use: Low Risk (04/19/2024)   Patient History    Smoking Tobacco Use: Never    Smokeless Tobacco Use: Never    Passive Exposure: Never  Financial Resource Strain: Not on file  Food Insecurity: Not on file  Transportation Needs: Not on file  Physical Activity: Not on file  Stress: Not on file  Social Connections: Not on file  Depression (PHQ2-9): Low Risk (04/19/2024)   Depression (PHQ2-9)  PHQ-2 Score: 3  Alcohol Screen: Not on file  Housing: Not on file  Utilities: Not on file  Health Literacy: Not on file   Lives in a ***. Smoking: *** Occupation: ***  Environmental HistorySurveyor, Minerals in the house: Network Engineer in the family room: {Blank single:19197::yes,no} Carpet in the bedroom: {Blank single:19197::yes,no} Heating: {Blank single:19197::electric,gas,heat pump} Cooling: {Blank single:19197::central,window,heat pump} Pet: {Blank single:19197::yes ***,no}  Family History: Family History  Problem Relation Age of Onset   Diabetes Mother    Diabetes Father    Heart disease Father    Colon cancer Father        brain tumor   Colon polyps Father    Colitis Father    Irritable bowel syndrome Father    Fibromyalgia Sister    Hypertension Sister    Diabetes Maternal Aunt    Diabetes Maternal Uncle    Diabetes Paternal Aunt    Heart disease Paternal Uncle    Diabetes Paternal Uncle    Diabetes Maternal Grandmother    Diabetes Maternal Grandfather    Schizophrenia Maternal Grandfather    Heart disease Paternal Grandmother    Heart disease Paternal Grandfather     Cancer Other    Hypertension Other    Healthy Son    Healthy Son    Diabetes Daughter        PRE-DIABETES   Esophageal cancer Neg Hx    Problem                               Relation Asthma                                   *** Eczema                                *** Food allergy                           *** Allergic rhino conjunctivitis     ***  Review of Systems  Constitutional:  Negative for appetite change, chills, fever and unexpected weight change.  HENT:  Negative for congestion and rhinorrhea.   Eyes:  Negative for itching.  Respiratory:  Negative for cough, chest tightness, shortness of breath and wheezing.   Cardiovascular:  Negative for chest pain.  Gastrointestinal:  Negative for abdominal pain.  Genitourinary:  Negative for difficulty urinating.  Skin:  Negative for rash.  Neurological:  Negative for headaches.    Objective: There were no vitals taken for this visit. There is no height or weight on file to calculate BMI. Physical Exam Vitals and nursing note reviewed.  Constitutional:      Appearance: Normal appearance. She is well-developed.  HENT:     Head: Normocephalic and atraumatic.     Right Ear: Tympanic membrane and external ear normal.     Left Ear: Tympanic membrane and external ear normal.     Nose: Nose normal.     Mouth/Throat:     Mouth: Mucous membranes are moist.     Pharynx: Oropharynx is clear.  Eyes:     Conjunctiva/sclera: Conjunctivae normal.  Cardiovascular:     Rate and Rhythm: Normal rate and regular rhythm.     Heart sounds: Normal heart sounds. No murmur  heard.    No friction rub. No gallop.  Pulmonary:     Effort: Pulmonary effort is normal.     Breath sounds: Normal breath sounds. No wheezing, rhonchi or rales.  Musculoskeletal:     Cervical back: Neck supple.  Skin:    General: Skin is warm.     Findings: No rash.  Neurological:     Mental Status: She is alert and oriented to person, place, and time.  Psychiatric:         Behavior: Behavior normal.    The plan was reviewed with the patient/family, and all questions/concerned were addressed.  It was my pleasure to see Francelia today and participate in her care. Please feel free to contact me with any questions or concerns.  Sincerely,  Orlan Cramp, DO Allergy  & Immunology  [facility]  [place] office: [phone] [facility] office: [phone]    [1]  Allergies Allergen Reactions   Cyclobenzaprine  Other (See Comments)    Made the patient feel woozy   Medrol  [Methylprednisolone ] Other (See Comments)    Cannot take because of past gastric bypass surgery   Codeine Rash   Tuna [Fish Allergy ] Rash   "

## 2024-05-08 ENCOUNTER — Ambulatory Visit: Payer: PRIVATE HEALTH INSURANCE | Admitting: Allergy

## 2024-05-08 ENCOUNTER — Encounter: Payer: Self-pay | Admitting: Allergy

## 2024-05-08 ENCOUNTER — Other Ambulatory Visit: Payer: Self-pay

## 2024-05-08 VITALS — BP 128/70 | HR 96 | Temp 98.0°F | Resp 18 | Ht 67.0 in | Wt 312.7 lb

## 2024-05-08 DIAGNOSIS — T7819XA Other adverse food reactions, not elsewhere classified, initial encounter: Secondary | ICD-10-CM

## 2024-05-08 NOTE — Patient Instructions (Addendum)
 Food Continue to avoid moscato wine and nerd candy. I don't have any testing for these items.  Continue to avoid tuna as before. For mild symptoms you can take over the counter antihistamines (zyrtec  10mg  to 20mg ) and monitor symptoms closely.  If symptoms worsen or if you have severe symptoms including breathing issues, throat closure, significant swelling, whole body hives, severe diarrhea and vomiting, lightheadedness then use epinephrine  and seek immediate medical care afterwards. Emergency action plan given.  Follow up as needed.

## 2024-05-12 ENCOUNTER — Ambulatory Visit: Payer: Self-pay

## 2024-05-12 DIAGNOSIS — Z111 Encounter for screening for respiratory tuberculosis: Secondary | ICD-10-CM | POA: Diagnosis present

## 2024-05-12 NOTE — Progress Notes (Signed)
 Patient is here for a PPD placement.  PPD placed in left forearm @ 0840 am.  Patient will return 05/15/2024 to have PPD read.   Chiquita JAYSON English, RN

## 2024-05-15 ENCOUNTER — Ambulatory Visit: Payer: Self-pay

## 2024-05-15 ENCOUNTER — Ambulatory Visit: Payer: Self-pay | Admitting: Family Medicine

## 2024-05-15 DIAGNOSIS — Z111 Encounter for screening for respiratory tuberculosis: Secondary | ICD-10-CM

## 2024-05-15 LAB — TB SKIN TEST
Induration: 0 mm
TB Skin Test: NEGATIVE

## 2024-05-15 NOTE — Progress Notes (Signed)
PPD Reading Note PPD read and results entered in EpicCare. Result: 0 mm induration. Interpretation: Negative Allergic reaction: No  

## 2024-05-23 ENCOUNTER — Other Ambulatory Visit: Payer: Self-pay | Admitting: Medical Genetics

## 2024-05-23 DIAGNOSIS — Z006 Encounter for examination for normal comparison and control in clinical research program: Secondary | ICD-10-CM

## 2024-05-24 ENCOUNTER — Encounter: Payer: Self-pay | Admitting: Family Medicine

## 2024-05-25 ENCOUNTER — Other Ambulatory Visit: Payer: Self-pay | Admitting: Family Medicine

## 2024-05-25 MED ORDER — OXYCODONE-ACETAMINOPHEN 10-325 MG PO TABS: ORAL_TABLET | ORAL | 0 refills | Status: AC

## 2024-05-26 ENCOUNTER — Ambulatory Visit (INDEPENDENT_AMBULATORY_CARE_PROVIDER_SITE_OTHER): Admitting: Family Medicine

## 2024-05-26 VITALS — BP 95/46 | Ht 67.0 in | Wt 312.0 lb

## 2024-05-26 DIAGNOSIS — M1711 Unilateral primary osteoarthritis, right knee: Secondary | ICD-10-CM | POA: Insufficient documentation

## 2024-05-26 MED ORDER — METHYLPREDNISOLONE ACETATE 40 MG/ML IJ SUSP
40.0000 mg | Freq: Once | INTRAMUSCULAR | Status: AC
  Administered 2024-05-26: 40 mg via INTRA_ARTICULAR

## 2024-05-26 NOTE — Assessment & Plan Note (Signed)
 Corticosteroid injection today and f/u prn

## 2024-05-26 NOTE — Progress Notes (Signed)
" °  Crystal Garza - 55 y.o. female MRN [id number]  Date of birth: 09-08-1970    SUBJECTIVE:      Chief Complaint:/ HPI:   Righht knee pain Similar to prior issues    OBJECTIVE: BP (!) 95/46   Ht 5' 7 (1.702 m)   Wt (!) 312 lb (141.5 kg)   BMI 48.87 kg/m   Physical Exam:  Vital signs are reviewed. KNEE right: TTP medial joint line. Chronic synovial hypertrophy changes noted with bogginess on palpation. No effusion. No erthema. Calf is soft   PROCEDURE: INJECTION: Patient was given informed consent, signed copy in the chart. Appropriate time out was taken. Area prepped and draped in usual sterile fashion. Ethyl chloride was  used for local anesthesia. A 21 gauge 1 1/2 inch needle was used.. 1 cc of methylprednisolone  40 mg/ml plus  4 cc of 1% lidocaine  without epinephrine  was injected into the right knee using a(n) anteriomedial approach.   The patient tolerated the procedure well. There were no complications. Post procedure instructions were given.   ASSESSMENT & PLAN:  See problem based charting & AVS for pt instructions. Primary osteoarthritis of right knee Corticosteroid injection today and f/u prn  "

## 2024-05-30 ENCOUNTER — Encounter: Payer: Self-pay | Admitting: Family Medicine

## 2024-05-30 ENCOUNTER — Telehealth: Payer: Self-pay | Admitting: Family Medicine

## 2024-05-30 NOTE — Telephone Encounter (Signed)
 She sent me a note about her pain medication needing a prior auth. Can you call her pharmacy and see what the deal is? THANKS! Crystal Garza Maybe because it is a new year? How do I get the form for prior auth? I thought the pharmacy sent it.

## 2024-06-01 NOTE — Telephone Encounter (Signed)
 Patient returns call to nurse line regarding PA for pain medication.   She reports that she just had to pay out of pocket for her medication. Per last PA, approval was through 06/04/24.   Routing to both [place] and [place] (unsure who is completing this PA.)  Chiquita JAYSON English, RN

## 2024-06-02 ENCOUNTER — Other Ambulatory Visit (HOSPITAL_COMMUNITY): Payer: Self-pay

## 2024-06-02 NOTE — Telephone Encounter (Signed)
 Pharmacy Patient Advocate Encounter   Received notification from [employer] CMM KEY that prior authorization for OXY/APAP 10-325MG  is required/requested.   Insurance verification completed.   The patient is insured through [facility] MEDICAID.   PA required; PA submitted to above mentioned insurance via [facility] Tracks Key/confirmation #/EOC [id number] W Status is pending

## 2024-06-05 ENCOUNTER — Other Ambulatory Visit (HOSPITAL_COMMUNITY): Payer: Self-pay

## 2024-06-05 NOTE — Telephone Encounter (Signed)
 Pharmacy Patient Advocate Encounter  Received notification from [place] MEDICAID that Prior Authorization for OXY AC TAB has been DENIED.  No reason given; No denial letter received via Fax or CMM.   Will contact for denial reason.

## 2024-06-05 NOTE — Telephone Encounter (Signed)
 Per Medicaid rep:  PA reauthorization denied due to needing current plan of care/ documentation.

## 2024-06-08 ENCOUNTER — Ambulatory Visit: Admitting: Podiatry

## 2024-06-08 ENCOUNTER — Other Ambulatory Visit (HOSPITAL_COMMUNITY): Payer: Self-pay

## 2024-06-08 NOTE — Telephone Encounter (Signed)
 Contacted Medicaid regarding previous PA approval for oxy-apap (ref# [id number]):   Per Medicaid rep the approval from 03/06/24-06/04/24 was for a quantity of #540 tablets for 90 days.  Patient filled the following:   03/06/24: #180 04/03/24: #180 05/01/24: #180  All units in her prior authorization were used before her prior authorization was expired.  For future purpose, short acting CII's can be approved up to 180 days (6 months) with medicaid.

## 2024-06-08 NOTE — Telephone Encounter (Signed)
 Called patient and advised of update per [place].   We went ahead and scheduled follow up visit with Dr. Rosalynn on 3/4. Advised that I would reach out to front office team regarding being placed on cancellation list.   Will also discuss with Dr. Rosalynn next week.   Chiquita JAYSON English, RN

## 2024-06-09 NOTE — Telephone Encounter (Signed)
 Chiquita This is crazy! I will see her off schedule early next week. See if she can come in Monday at 11:00 AM or Mon PM at 2:00. If neither of those work for her, I will find another time.  Once you get a time solidified, can you have them put a the,plate in for me.  Thank you so much for your help on  this.  I guess all PAs are only 3 months now?

## 2024-06-09 NOTE — Telephone Encounter (Signed)
 Wow that is confusing! So, do I need to do another prior authorization for her moving forward?

## 2024-06-09 NOTE — Telephone Encounter (Signed)
 Called patient. Scheduled for Monday at 2:00 pm.   Dr. Rosalynn- my understanding from [name] is that we can submit authorization for 90 days for long acting and 180 days for short acting.  Crystal JAYSON English, RN

## 2024-06-12 ENCOUNTER — Ambulatory Visit: Admitting: Family Medicine

## 2024-06-22 ENCOUNTER — Ambulatory Visit: Admitting: Podiatry

## 2024-07-05 ENCOUNTER — Ambulatory Visit: Admitting: Family Medicine
